# Patient Record
Sex: Male | Born: 1975 | Race: Black or African American | Hispanic: No | Marital: Single | Smoking: Current some day smoker
Health system: Southern US, Community
[De-identification: ages and names within clinical notes are randomized; demographics above are authoritative.]

## PROBLEM LIST (undated history)

## (undated) DIAGNOSIS — F209 Schizophrenia, unspecified: Secondary | ICD-10-CM

## (undated) DIAGNOSIS — F32A Depression, unspecified: Secondary | ICD-10-CM

## (undated) DIAGNOSIS — F419 Anxiety disorder, unspecified: Secondary | ICD-10-CM

## (undated) DIAGNOSIS — F319 Bipolar disorder, unspecified: Secondary | ICD-10-CM

## (undated) DIAGNOSIS — R768 Other specified abnormal immunological findings in serum: Secondary | ICD-10-CM

## (undated) DIAGNOSIS — G4730 Sleep apnea, unspecified: Secondary | ICD-10-CM

## (undated) DIAGNOSIS — J45909 Unspecified asthma, uncomplicated: Secondary | ICD-10-CM

## (undated) DIAGNOSIS — B1920 Unspecified viral hepatitis C without hepatic coma: Secondary | ICD-10-CM

## (undated) DIAGNOSIS — F329 Major depressive disorder, single episode, unspecified: Secondary | ICD-10-CM

## (undated) DIAGNOSIS — B182 Chronic viral hepatitis C: Secondary | ICD-10-CM

## (undated) HISTORY — DX: Major depressive disorder, single episode, unspecified: F32.9

## (undated) HISTORY — DX: Sleep apnea, unspecified: G47.30

## (undated) HISTORY — DX: Depression, unspecified: F32.A

## (undated) HISTORY — DX: Anxiety disorder, unspecified: F41.9

## (undated) HISTORY — DX: Other specified abnormal immunological findings in serum: R76.8

## (undated) HISTORY — DX: Chronic viral hepatitis C: B18.2

## (undated) HISTORY — PX: CYST REMOVAL NECK: SHX6281

## (undated) HISTORY — DX: Unspecified asthma, uncomplicated: J45.909

---

## 2003-02-20 ENCOUNTER — Emergency Department (HOSPITAL_COMMUNITY): Admission: EM | Admit: 2003-02-20 | Discharge: 2003-02-20 | Payer: Self-pay | Admitting: Emergency Medicine

## 2003-02-25 ENCOUNTER — Emergency Department (HOSPITAL_COMMUNITY): Admission: EM | Admit: 2003-02-25 | Discharge: 2003-02-25 | Payer: Self-pay | Admitting: Emergency Medicine

## 2004-10-30 ENCOUNTER — Emergency Department: Payer: Self-pay | Admitting: Internal Medicine

## 2008-01-14 ENCOUNTER — Emergency Department: Payer: Self-pay | Admitting: Internal Medicine

## 2008-01-16 ENCOUNTER — Emergency Department: Payer: Self-pay | Admitting: Emergency Medicine

## 2009-05-14 ENCOUNTER — Emergency Department: Payer: Self-pay | Admitting: Emergency Medicine

## 2010-03-21 ENCOUNTER — Emergency Department: Payer: Self-pay | Admitting: Emergency Medicine

## 2010-03-28 ENCOUNTER — Emergency Department: Payer: Self-pay | Admitting: Emergency Medicine

## 2010-06-03 ENCOUNTER — Emergency Department: Payer: Self-pay | Admitting: Emergency Medicine

## 2012-09-09 ENCOUNTER — Emergency Department: Payer: Self-pay | Admitting: Emergency Medicine

## 2012-09-09 LAB — DRUG SCREEN, URINE
Amphetamines, Ur Screen: NEGATIVE (ref ?–1000)
Benzodiazepine, Ur Scrn: NEGATIVE (ref ?–200)
Cannabinoid 50 Ng, Ur ~~LOC~~: POSITIVE (ref ?–50)
Cocaine Metabolite,Ur ~~LOC~~: NEGATIVE (ref ?–300)
MDMA (Ecstasy)Ur Screen: NEGATIVE (ref ?–500)
Phencyclidine (PCP) Ur S: NEGATIVE (ref ?–25)
Tricyclic, Ur Screen: NEGATIVE (ref ?–1000)

## 2012-09-09 LAB — URINALYSIS, COMPLETE
Bacteria: NONE SEEN
Bilirubin,UR: NEGATIVE
Blood: NEGATIVE
Glucose,UR: NEGATIVE mg/dL
Ketone: NEGATIVE
Leukocyte Esterase: NEGATIVE
Nitrite: NEGATIVE
Ph: 5
Protein: NEGATIVE
RBC,UR: <1 /HPF
Specific Gravity: 1.024
Squamous Epithelial: 1
WBC UR: 2 /HPF

## 2012-09-09 LAB — COMPREHENSIVE METABOLIC PANEL
Albumin: 3.9 g/dL (ref 3.4–5.0)
Alkaline Phosphatase: 45 U/L — ABNORMAL LOW (ref 50–136)
Anion Gap: 11 (ref 7–16)
BUN: 6 mg/dL — ABNORMAL LOW (ref 7–18)
Calcium, Total: 8.2 mg/dL — ABNORMAL LOW (ref 8.5–10.1)
Chloride: 108 mmol/L — ABNORMAL HIGH (ref 98–107)
Creatinine: 0.97 mg/dL (ref 0.60–1.30)
EGFR (African American): >60
Glucose: 109 mg/dL — ABNORMAL HIGH (ref 65–99)
Potassium: 3.6 mmol/L (ref 3.5–5.1)
SGOT(AST): 53 U/L — ABNORMAL HIGH (ref 15–37)
Total Protein: 7.8 g/dL (ref 6.4–8.2)

## 2012-09-09 LAB — TROPONIN I
Troponin-I: <0.02 ng/mL
Troponin-I: <0.02 ng/mL

## 2012-09-09 LAB — CBC
HCT: 39.8 % — ABNORMAL LOW (ref 40.0–52.0)
HGB: 14.1 g/dL (ref 13.0–18.0)
MCH: 31.4 pg (ref 26.0–34.0)
MCHC: 35.5 g/dL (ref 32.0–36.0)
WBC: 10.2 10*3/uL (ref 3.8–10.6)

## 2012-09-09 LAB — CK TOTAL AND CKMB (NOT AT ARMC)
CK, Total: 917 U/L — ABNORMAL HIGH (ref 35–232)
CK, Total: 755 U/L — ABNORMAL HIGH (ref 35–232)
CK-MB: 3.4 ng/mL (ref 0.5–3.6)
CK-MB: 2.7 ng/mL (ref 0.5–3.6)

## 2012-09-10 LAB — CK TOTAL AND CKMB (NOT AT ARMC)
CK, Total: 656 U/L — ABNORMAL HIGH (ref 35–232)
CK-MB: 2.5 ng/mL (ref 0.5–3.6)

## 2014-01-24 ENCOUNTER — Emergency Department: Payer: Self-pay | Admitting: Emergency Medicine

## 2014-01-24 LAB — CBC
HCT: 41.3 % (ref 40.0–52.0)
HGB: 14.6 g/dL (ref 13.0–18.0)
MCH: 32.1 pg (ref 26.0–34.0)
MCHC: 35.3 g/dL (ref 32.0–36.0)
MCV: 91 fL (ref 80–100)
Platelet: 165 10*3/uL (ref 150–440)
RBC: 4.54 10*6/uL (ref 4.40–5.90)
RDW: 15.1 % — ABNORMAL HIGH (ref 11.5–14.5)
WBC: 7.2 10*3/uL (ref 3.8–10.6)

## 2014-01-24 LAB — COMPREHENSIVE METABOLIC PANEL
ALBUMIN: 3.6 g/dL (ref 3.4–5.0)
ALT: 44 U/L (ref 12–78)
AST: 52 U/L — AB (ref 15–37)
Alkaline Phosphatase: 46 U/L
Anion Gap: 2 — ABNORMAL LOW (ref 7–16)
BUN: 8 mg/dL (ref 7–18)
Bilirubin,Total: 0.4 mg/dL (ref 0.2–1.0)
CALCIUM: 8 mg/dL — AB (ref 8.5–10.1)
Chloride: 109 mmol/L — ABNORMAL HIGH (ref 98–107)
Co2: 27 mmol/L (ref 21–32)
Creatinine: 1.05 mg/dL (ref 0.60–1.30)
EGFR (African American): >60
GLUCOSE: 99 mg/dL (ref 65–99)
OSMOLALITY: 274 (ref 275–301)
POTASSIUM: 3.8 mmol/L (ref 3.5–5.1)
Sodium: 138 mmol/L (ref 136–145)
Total Protein: 7.2 g/dL (ref 6.4–8.2)

## 2014-01-24 LAB — TROPONIN I: Troponin-I: <0.02 ng/mL

## 2015-03-02 NOTE — Consult Note (Signed)
Brief Consult Note: Diagnosis: Paranoid schizophrenia.   Patient was seen by consultant.   Consult note dictated.   Recommend further assessment or treatment.   Orders entered.   Discussed with Attending MD.   Comments: Mr. Delford FieldWright has a h/o schizophrenia. He complains of auditory hallucinations in the context of medication noncompliance as he stopped [place] Sust[place] injections 2, or more, months ago. He is not suicidal or homicidal. There are no command halucinations.   PLAN: 1. The patient no longer meets criteria for IVC. I will terminate proceedings. Please discharge as appriopriate.  2. Will give initial dose of Invega sustenna 324 mg im now.   3. [facility]:30 am. He has appopintemnt with Berline Choughhristine Odom.   4. His family will pick him up from ER.  Electronic Signatures: Kristine LineaPucilowska, Marvel Sapp (MD)  (Signed 29-Oct-13 10:25)  Authored: Brief Consult Note   Last Updated: 29-Oct-13 10:25 by Kristine LineaPucilowska, Zidan Helget (MD)

## 2015-05-30 ENCOUNTER — Emergency Department
Admission: EM | Admit: 2015-05-30 | Discharge: 2015-05-30 | Disposition: A | Discharge status: Home / Self Care | Payer: Medicaid Other | Attending: Emergency Medicine | Admitting: Emergency Medicine

## 2015-05-30 ENCOUNTER — Encounter: Payer: Self-pay | Admitting: Emergency Medicine

## 2015-05-30 ENCOUNTER — Other Ambulatory Visit: Payer: Self-pay

## 2015-05-30 ENCOUNTER — Emergency Department: Payer: Medicaid Other

## 2015-05-30 DIAGNOSIS — R079 Chest pain, unspecified: Secondary | ICD-10-CM

## 2015-05-30 DIAGNOSIS — Z720 Tobacco use: Secondary | ICD-10-CM | POA: Insufficient documentation

## 2015-05-30 HISTORY — DX: Schizophrenia, unspecified: F20.9

## 2015-05-30 HISTORY — DX: Bipolar disorder, unspecified: F31.9

## 2015-05-30 LAB — BASIC METABOLIC PANEL
ANION GAP: 7 (ref 5–15)
BUN: 10 mg/dL (ref 6–20)
CALCIUM: 8.5 mg/dL — AB (ref 8.9–10.3)
CO2: 23 mmol/L (ref 22–32)
CREATININE: 0.91 mg/dL (ref 0.61–1.24)
Chloride: 106 mmol/L (ref 101–111)
GFR calc Af Amer: >60 mL/min (ref >60)
GFR calc non Af Amer: >60 mL/min (ref >60)
GLUCOSE: 127 mg/dL — AB (ref 65–99)
Potassium: 3.3 mmol/L — ABNORMAL LOW (ref 3.5–5.1)
SODIUM: 136 mmol/L (ref 135–145)

## 2015-05-30 LAB — TROPONIN I
Troponin I: <0.03 ng/mL (ref <0.031)
Troponin I: <0.03 ng/mL (ref <0.031)

## 2015-05-30 LAB — CBC
HCT: 41.3 % (ref 40.0–52.0)
Hemoglobin: 14.6 g/dL (ref 13.0–18.0)
MCH: 31.2 pg (ref 26.0–34.0)
MCHC: 35.3 g/dL (ref 32.0–36.0)
MCV: 88.5 fL (ref 80.0–100.0)
Platelets: 168 10*3/uL (ref 150–440)
RBC: 4.67 MIL/uL (ref 4.40–5.90)
RDW: 14.5 % (ref 11.5–14.5)
WBC: 7.4 10*3/uL (ref 3.8–10.6)

## 2015-05-30 MED ORDER — GI COCKTAIL ~~LOC~~
ORAL | Status: AC
  Administered 2015-05-30: 30 mL via ORAL
  Filled 2015-05-30: qty 30

## 2015-05-30 MED ORDER — GI COCKTAIL ~~LOC~~
30.0000 mL | Freq: Once | ORAL | Status: AC
  Administered 2015-05-30: 30 mL via ORAL

## 2015-05-30 NOTE — ED Provider Notes (Signed)
Patient signed out to me by Dr. Manson PasseyBrown follow-up second troponin. Second troponin negative. Patient informed he feels well and is ready for discharge.  Jene Everyobert Omega Slager, MD 05/30/15 1630

## 2015-05-30 NOTE — Discharge Instructions (Signed)

## 2015-05-30 NOTE — ED Provider Notes (Signed)
[facility] Emergency Department Provider Note  ____________________________________________  Time seen: 1:15 PM  I have reviewed the triage vital signs and the nursing notes.   HISTORY  Chief Complaint Chest Pain      HPI Dominic [name] is a 39 y.o. male presents with central chest pain 2 days intermittently. Patient states pain lasting approximately few minutes each episode. Of note patient states he seems this same discomfort every time he gets is [place] for which she is prescribed for his schizophrenia. Patient stated last time he received his and vagal is one month ago   Past medical history Schizophrenia     Past Surgical History  Procedure Laterality Date  . Cyst removal hand      neck    Current Outpatient Rx  Name  Route  Sig  Dispense  Refill  . diphenhydrAMINE (SOMINEX) 25 MG tablet   Oral   Take 25 mg by mouth at bedtime as needed for sleep.         . Paliperidone Palmitate (INVEGA SUSTENNA IM)   Intramuscular   Inject 1 Dose into the muscle every 30 (thirty) days.           Allergies Review of patient's allergies indicates no known allergies.  History reviewed. No pertinent family history.  Social History History  Substance Use Topics  . Smoking status: Current Every Day Smoker -- 0.50 packs/day    Types: Cigarettes  . Smokeless tobacco: Never Used  . Alcohol Use: Yes     Comment: 3 x 25 oz every other day    Review of Systems  Constitutional: Negative for fever. Eyes: Negative for visual changes. ENT: Negative for sore throat. Cardiovascular: Negative for chest pain. Respiratory: Negative for shortness of breath. Gastrointestinal: Negative for abdominal pain, vomiting and diarrhea. Genitourinary: Negative for dysuria. Musculoskeletal: Negative for back pain. Skin: Negative for rash. Neurological: Negative for headaches, focal weakness or numbness.   10-point ROS otherwise  negative.  ____________________________________________   PHYSICAL EXAM:  VITAL SIGNS: ED Triage Vitals  Enc Vitals Group     BP 05/30/15 1200 132/86 mmHg     Pulse Rate 05/30/15 1200 64     Resp --      Temp 05/30/15 1200 97.9 F (36.6 C)     Temp Source 05/30/15 1200 Oral     SpO2 05/30/15 1200 94 %     Weight 05/30/15 1200 250 lb (113.399 kg)     Height 05/30/15 1200 5\' 11"  (1.803 m)     Head Cir --      Peak Flow --      Pain Score 05/30/15 1201 9     Pain Loc --      Pain Edu? --      Excl. in GC? --      Constitutional: Alert and oriented. Well appearing and in no distress. Eyes: Conjunctivae are normal. PERRL. Normal extraocular movements. ENT   Head: Normocephalic and atraumatic.   Nose: No congestion/rhinnorhea.   Mouth/Throat: Mucous membranes are moist.   Neck: No stridor. Cardiovascular: Normal rate, regular rhythm. Normal and symmetric distal pulses are present in all extremities. No murmurs, rubs, or gallops. Respiratory: Normal respiratory effort without tachypnea nor retractions. Breath sounds are clear and equal bilaterally. No wheezes/rales/rhonchi. Gastrointestinal: Soft and nontender. No distention. There is no CVA tenderness. Genitourinary: deferred Musculoskeletal: Nontender with normal range of motion in all extremities. No joint effusions.  No lower extremity tenderness nor edema. Neurologic:  Normal speech  and language. No gross focal neurologic deficits are appreciated. Speech is normal.  Skin:  Skin is warm, dry and intact. No rash noted. Psychiatric: Mood and affect are normal. Speech and behavior are normal. Patient exhibits appropriate insight and judgment.  ____________________________________________    LABS (pertinent positives/negatives)  Labs Reviewed  BASIC METABOLIC PANEL - Abnormal; Notable for the following:    Potassium 3.3 (*)    Glucose, Bld 127 (*)    Calcium 8.5 (*)    All other components within normal  limits  CBC  TROPONIN I  TROPONIN I     ____________________________________________   EKG  ED ECG REPORT I, BROWN, Butler N, the attending physician, personally viewed and interpreted this ECG.   Date: 05/30/2015  EKG Time: 12:01 PM  Rate: 65  Rhythm:  Normal sinus rhythm Axis: Normal  Intervals: Normal  ST&T Change: None   ____________________________________________    RADIOLOGY  Chest x-ray revealed:  IMPRESSION: No evidence of acute cardiopulmonary disease.   Electronically Signed By: Charline Bills M.D. On: 05/30/2015 12:45     INITIAL IMPRESSION / ASSESSMENT AND PLAN / ED COURSE  Pertinent labs & imaging results that were available during my care of the patient were reviewed by me and considered in my medical decision making (see chart for details).  Initial troponin negative chest x-ray unremarkable perc 0. Will repeat troponin if negative patient will be discharged home with cardiology follow-up. ____________________________________________   FINAL CLINICAL IMPRESSION(S) / ED DIAGNOSES  Final diagnoses:  Chest pain, unspecified chest pain type      Darci Current, MD 06/01/15 [phone]

## 2015-05-30 NOTE — ED Notes (Signed)
Pt reports that he has had chest pain intermittently x 2 days. He states that he takes (invega) (psych med) injection monthly and that he has the pain after the injection. He thinks they are related. The last injection was June 25. He reports that in the past the pain has only lasted a day or so, but it has persisted for 2 days this time. Pt is alert & oriented with NAD.

## 2016-02-16 ENCOUNTER — Encounter: Payer: Self-pay | Admitting: *Deleted

## 2016-02-16 ENCOUNTER — Emergency Department
Admission: EM | Admit: 2016-02-16 | Discharge: 2016-02-16 | Disposition: A | Discharge status: Other Acute Inpatient Hospital | Payer: Medicaid Other | Attending: Emergency Medicine | Admitting: Emergency Medicine

## 2016-02-16 DIAGNOSIS — F1210 Cannabis abuse, uncomplicated: Secondary | ICD-10-CM | POA: Diagnosis not present

## 2016-02-16 DIAGNOSIS — F19959 Other psychoactive substance use, unspecified with psychoactive substance-induced psychotic disorder, unspecified: Secondary | ICD-10-CM | POA: Diagnosis not present

## 2016-02-16 DIAGNOSIS — F209 Schizophrenia, unspecified: Secondary | ICD-10-CM | POA: Insufficient documentation

## 2016-02-16 DIAGNOSIS — F1410 Cocaine abuse, uncomplicated: Secondary | ICD-10-CM

## 2016-02-16 DIAGNOSIS — F17210 Nicotine dependence, cigarettes, uncomplicated: Secondary | ICD-10-CM | POA: Diagnosis not present

## 2016-02-16 DIAGNOSIS — F10920 Alcohol use, unspecified with intoxication, uncomplicated: Secondary | ICD-10-CM

## 2016-02-16 DIAGNOSIS — F29 Unspecified psychosis not due to a substance or known physiological condition: Secondary | ICD-10-CM

## 2016-02-16 DIAGNOSIS — F3181 Bipolar II disorder: Secondary | ICD-10-CM

## 2016-02-16 DIAGNOSIS — F1010 Alcohol abuse, uncomplicated: Secondary | ICD-10-CM

## 2016-02-16 DIAGNOSIS — F329 Major depressive disorder, single episode, unspecified: Secondary | ICD-10-CM | POA: Insufficient documentation

## 2016-02-16 DIAGNOSIS — F10129 Alcohol abuse with intoxication, unspecified: Secondary | ICD-10-CM | POA: Insufficient documentation

## 2016-02-16 DIAGNOSIS — F919 Conduct disorder, unspecified: Secondary | ICD-10-CM | POA: Diagnosis present

## 2016-02-16 LAB — COMPREHENSIVE METABOLIC PANEL
ALBUMIN: 3.9 g/dL (ref 3.5–5.0)
ALK PHOS: 39 U/L (ref 38–126)
ALT: 19 U/L (ref 17–63)
AST: 26 U/L (ref 15–41)
Anion gap: 6 (ref 5–15)
BUN: 6 mg/dL (ref 6–20)
CO2: 21 mmol/L — AB (ref 22–32)
CREATININE: 0.79 mg/dL (ref 0.61–1.24)
Calcium: 8.5 mg/dL — ABNORMAL LOW (ref 8.9–10.3)
Chloride: 111 mmol/L (ref 101–111)
GFR calc Af Amer: >60 mL/min (ref >60)
GFR calc non Af Amer: >60 mL/min (ref >60)
GLUCOSE: 107 mg/dL — AB (ref 65–99)
Potassium: 3.5 mmol/L (ref 3.5–5.1)
Sodium: 138 mmol/L (ref 135–145)
Total Bilirubin: 0.3 mg/dL (ref 0.3–1.2)
Total Protein: 7.7 g/dL (ref 6.5–8.1)

## 2016-02-16 LAB — CBC
HCT: 40.5 % (ref 40.0–52.0)
Hemoglobin: 14.5 g/dL (ref 13.0–18.0)
MCH: 30.7 pg (ref 26.0–34.0)
MCHC: 35.7 g/dL (ref 32.0–36.0)
MCV: 86.1 fL (ref 80.0–100.0)
Platelets: 154 10*3/uL (ref 150–440)
RBC: 4.7 MIL/uL (ref 4.40–5.90)
RDW: 15.4 % — ABNORMAL HIGH (ref 11.5–14.5)
WBC: 8.2 10*3/uL (ref 3.8–10.6)

## 2016-02-16 LAB — ETHANOL: Alcohol, Ethyl (B): 143 mg/dL — ABNORMAL HIGH (ref <5)

## 2016-02-16 LAB — SALICYLATE LEVEL: Salicylate Lvl: <4 mg/dL (ref 2.8–30.0)

## 2016-02-16 LAB — URINE DRUG SCREEN, QUALITATIVE (ARMC ONLY)
Amphetamines, Ur Screen: NOT DETECTED
BARBITURATES, UR SCREEN: NOT DETECTED
Benzodiazepine, Ur Scrn: NOT DETECTED
CANNABINOID 50 NG, UR ~~LOC~~: POSITIVE — AB
COCAINE METABOLITE, UR ~~LOC~~: POSITIVE — AB
MDMA (Ecstasy)Ur Screen: NOT DETECTED
Methadone Scn, Ur: NOT DETECTED
OPIATE, UR SCREEN: NOT DETECTED
PHENCYCLIDINE (PCP) UR S: NOT DETECTED
Tricyclic, Ur Screen: NOT DETECTED

## 2016-02-16 LAB — ACETAMINOPHEN LEVEL

## 2016-02-16 MED ORDER — LORAZEPAM 2 MG PO TABS
ORAL_TABLET | ORAL | Status: AC
  Administered 2016-02-16: 2 mg via ORAL
  Filled 2016-02-16: qty 1

## 2016-02-16 MED ORDER — LORAZEPAM 2 MG PO TABS
2.0000 mg | ORAL_TABLET | Freq: Once | ORAL | Status: AC
  Administered 2016-02-16: 2 mg via ORAL

## 2016-02-16 MED ORDER — ZIPRASIDONE MESYLATE 20 MG IM SOLR: 10.0000 mg | Freq: Once | INTRAMUSCULAR | Status: DC

## 2016-02-16 MED ORDER — ZIPRASIDONE MESYLATE 20 MG IM SOLR
INTRAMUSCULAR | Status: AC
  Filled 2016-02-16: qty 20

## 2016-02-16 NOTE — ED Notes (Signed)
Pt speaking on phone to fiance. Pt is calm. No concerns voiced. No distress noted. Maintained on 15 minute checks and observation by security camera for safety.

## 2016-02-16 NOTE — ED Notes (Signed)
Pt denies pain, SI/HI and AVH.

## 2016-02-16 NOTE — ED Notes (Signed)
Pt brought in by [place] police.  Pt is IVC.  Pt has been drinking alcohol.  Pt denies SI or HI.  Pt has using cocaine today.

## 2016-02-16 NOTE — ED Notes (Signed)
Pt taking a shower. No concerns voiced. No distress noted. Maintained on 15 minute checks and observation by security camera for safety.  Pt to be  discharged.

## 2016-02-16 NOTE — Discharge Instructions (Signed)
Polysubstance Abuse °When people abuse more than one drug or type of drug it is called polysubstance or polydrug abuse. For example, many smokers also drink alcohol. This is one form of polydrug abuse. Polydrug abuse also refers to the use of a drug to counteract an unpleasant effect produced by another drug. It may also be used to help with withdrawal from another drug. People who take stimulants may become agitated. Sometimes this agitation is countered with a tranquilizer. This helps protect against the unpleasant side effects. Polydrug abuse also refers to the use of different drugs at the same time.  °Anytime drug use is interfering with normal living activities, it has become abuse. This includes problems with family and friends. Psychological dependence has developed when your mind tells you that the drug is needed. This is usually followed by physical dependence which has developed when continuing increases of drug are required to get the same feeling or "high". This is known as addiction or chemical dependency. A person's risk is much higher if there is a history of chemical dependency in the family. °SIGNS OF CHEMICAL DEPENDENCY °· You have been told by friends or family that drugs have become a problem. °· You fight when using drugs. °· You are having blackouts (not remembering what you do while using). °· You feel sick from using drugs but continue using. °· You lie about use or amounts of drugs (chemicals) used. °· You need chemicals to get you going. °· You are suffering in work performance or in school because of drug use. °· You get sick from use of drugs but continue to use anyway. °· You need drugs to relate to people or feel comfortable in social situations. °· You use drugs to forget problems. °"Yes" answered to any of the above signs of chemical dependency indicates there are problems. The longer the use of drugs continues, the greater the problems will become. °If there is a family history of  drug or alcohol use, it is best not to experiment with these drugs. Continual use leads to tolerance. After tolerance develops more of the drug is needed to get the same feeling. This is followed by addiction. With addiction, drugs become the most important part of life. It becomes more important to take drugs than participate in the other usual activities of life. This includes relating to friends and family. Addiction is followed by dependency. Dependency is a condition where drugs are now needed not just to get high, but to feel normal. °Addiction cannot be cured but it can be stopped. This often requires outside help and the care of professionals. Treatment centers are listed in the yellow pages under: Cocaine, Narcotics, and Alcoholics Anonymous. Most hospitals and clinics can refer you to a specialized care center. Talk to your caregiver if you need help. °  °This information is not intended to replace advice given to you by your health care provider. Make sure you discuss any questions you have with your health care provider. °  °Document Released: 06/21/2005 Document Revised: 01/22/2012 Document Reviewed: 11/04/2014 °Elsevier Interactive Patient Education ©2016 Elsevier Inc. ° °

## 2016-02-16 NOTE — Consult Note (Signed)
[facility] Face-to-Face Psychiatry Consult   Reason for Consult:  Consult for this 40 year old man who is petition last night by his girlfriend with allegations that he was acting bizarrely and hostile. 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 Schizophrenia ([place]) [F20.9] 02/16/2016  . 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 Alcohol abuse [F10.10] 02/16/2016  . Cocaine abuse [F14.10] 02/16/2016    Total Time spent with patient: 1 hour  Subjective:   Dominic [name] is a 40 y.o. male patient admitted with "I was just at home and the cops came".  HPI:  Patient interviewed. Chart reviewed. Labs and vitals reviewed. 40 year old man brought in under IVC last night filed by his girlfriend with allegations that he had been agitated and acting bizarrely. Stated that he had been verbally hostile and had jumped out of a car at one point. The history given last night was that she thought that he had been drinking and abusing drugs and was out of control. The patient tells me that he thinks that his girlfriend was basically overreacting. He admits that he had some alcohol to drink yesterday eventually admitting that he probably had a full bottle of wine and that he was also using cocaine. He does admit that when he uses drugs and drinks he gets a little more agitated and rapid in his speech and changes some of his behavior but he denies that he had any thoughts of hurting himself or hurting anyone else. He denies that he was having any hallucinations. Patient says that overall his mood recently has been fine. Denies any sleep or health problems. Says that he rarely drinks maybe only a couple times a month and doesn't use cocaine nearly that often. He says that he has been compliant with his outpatient psychiatric treatment which is primarily getting a long-acting  injection of in [place] at [facility].  Social history: Lives by himself. 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 Has a girlfriend. Has some children as well. He makes some statements that suggest that he is a little bit paranoid but nothing clearly dramatic.  Medical history: Denies any significant medical problems including denying high blood pressure diabetes or heart disease.  Substance abuse history: He says that he only drinks a couple times a month and minimizes the degree to which substance abuse has been a problem. It looks like he's had at least one prior visit here to the hospital when substance abuse probably exacerbated his illness. Doesn't seem to of engaged in outpatient substance abuse treatment.  Past Psychiatric History: Has a history of schizophrenia. He says that when he is not getting his shot or misses it for a month he starts to hallucinate but that recently he has been compliant. He says he is next due for a shot on April 12. Denies that he's ever had inpatient psychiatric treatment which appears to be correct from what I can see. No history of suicide attempts or violence.  Risk to Self: Suicidal Ideation: No Suicidal Intent: No Is patient at risk for suicide?: No Suicidal Plan?: No Access to Means: No What has been your use of drugs/alcohol within the last 12 months?: Varied use of alcohol, crack cocaine, and marijuana How many times?: 1 Other Self Harm Risks: Long SA hx Triggers for Past Attempts: Unknown Intentional Self Injurious Behavior: None Risk to Others: Homicidal Ideation: No Thoughts of Harm to  Others: No Current Homicidal Intent: No Current Homicidal Plan: No Access to Homicidal Means: No Identified Victim: n/a History of harm to others?: No Assessment of Violence: In distant past (Endorses getting into fights when he was younger) Violent Behavior Description: No known recent violent behavior Does patient have access to weapons?: No Criminal Charges Pending?: No Does  patient have a court date: No Prior Inpatient Therapy: Prior Inpatient Therapy: Yes Prior Therapy Dates: Multiple Prior Therapy Facilty/Provider(s): [facility] Reason for Treatment: Schizophrenia Prior Outpatient Therapy: Prior Outpatient Therapy: Yes Prior Therapy Dates: Current Prior Therapy Facilty/Provider(s): [facility] Reason for Treatment: Schizophrenia; Med management Does patient have an ACCT team?: No Does patient have Intensive In-House Services?  : No Does patient have [facility] services? : No Does patient have P4CC services?: Unknown  Past Medical History:  Past Medical History  Diagnosis Date  . Bipolar 1 disorder ([place])   . Schizophrenia [facility])     Past Surgical History  Procedure Laterality Date  . Cyst removal hand      neck   Family History: No family history on file. Family Psychiatric  History: Patient says he is not aware of any family history of mental illness Social History:  History  Alcohol Use  . Yes    Comment: 3 x 25 oz every other day     History  Drug Use Not on file    Social History   Social History  . Marital Status: Single    Spouse Name: N/A  . Number of Children: N/A  . Years of Education: N/A   Social History Main Topics  . Smoking status: Current Every Day Smoker -- 0.50 packs/day    Types: Cigarettes  . Smokeless tobacco: Never Used  . Alcohol Use: Yes     Comment: 3 x 25 oz every other day  . Drug Use: None  . Sexual Activity: Not Asked   Other Topics Concern  . None   Social History Narrative   Additional Social History:    Allergies:  No Known Allergies  Labs:  Results for orders placed or performed during the hospital encounter of 02/16/16 (from the past 48 hour(s))  Comprehensive metabolic panel     Status: Abnormal   Collection Time: 02/16/16  1:16 AM  Result Value Ref Range   Sodium 138 135 - 145 mmol/L   Potassium 3.5 3.5 - 5.1 mmol/L   Chloride 111 101 - 111 mmol/L   CO2 21 (L) 22 - 32 mmol/L    Glucose, Bld 107 (H) 65 - 99 mg/dL   BUN 6 6 - 20 mg/dL   Creatinine, Ser 0.79 0.61 - 1.24 mg/dL   Calcium 8.5 (L) 8.9 - 10.3 mg/dL   Total Protein 7.7 6.5 - 8.1 g/dL   Albumin 3.9 3.5 - 5.0 g/dL   AST 26 15 - 41 U/L   ALT 19 17 - 63 U/L   Alkaline Phosphatase 39 38 - 126 U/L   Total Bilirubin 0.3 0.3 - 1.2 mg/dL   GFR calc non Af Amer >60 >60 mL/min   GFR calc Af Amer >60 >60 mL/min    Comment: (NOTE) The eGFR has been calculated using the CKD EPI equation. This calculation has not been validated in all clinical situations. eGFR's persistently <60 mL/min signify possible Chronic Kidney Disease.    Anion gap 6 5 - 15  Ethanol (ETOH)     Status: Abnormal   Collection Time: 02/16/16  1:16 AM  Result Value Ref Range  Alcohol, Ethyl (B) 143 (H) <5 mg/dL    Comment:        LOWEST DETECTABLE LIMIT FOR SERUM ALCOHOL IS 5 mg/dL FOR MEDICAL PURPOSES ONLY   Urine Drug Screen, Qualitative ([facility] only)     Status: Abnormal   Collection Time: 02/16/16  1:16 AM  Result Value Ref Range   Tricyclic, Ur Screen NONE DETECTED NONE DETECTED   Amphetamines, Ur Screen NONE DETECTED NONE DETECTED   MDMA (Ecstasy)Ur Screen NONE DETECTED NONE DETECTED   Cocaine Metabolite,Ur [place] POSITIVE (A) NONE DETECTED   Opiate, Ur Screen NONE DETECTED NONE DETECTED   Phencyclidine (PCP) Ur S NONE DETECTED NONE DETECTED   Cannabinoid 50 Ng, Ur [place] POSITIVE (A) NONE DETECTED   Barbiturates, Ur Screen NONE DETECTED NONE DETECTED   Benzodiazepine, Ur Scrn NONE DETECTED NONE DETECTED   Methadone Scn, Ur NONE DETECTED NONE DETECTED    Comment: (NOTE) 628  Tricyclics, urine               Cutoff 1000 ng/mL 200  Amphetamines, urine             Cutoff 1000 ng/mL 300  MDMA (Ecstasy), urine           Cutoff 500 ng/mL 400  Cocaine Metabolite, urine       Cutoff 300 ng/mL 500  Opiate, urine                   Cutoff 300 ng/mL 600  Phencyclidine (PCP), urine      Cutoff 25 ng/mL 700  Cannabinoid, urine              Cutoff  50 ng/mL 800  Barbiturates, urine             Cutoff 200 ng/mL 900  Benzodiazepine, urine           Cutoff 200 ng/mL 1000 Methadone, urine                Cutoff 300 ng/mL 1100 1200 The urine drug screen provides only a preliminary, unconfirmed 1300 analytical test result and should not be used for non-medical 1400 purposes. Clinical consideration and professional judgment should 1500 be applied to any positive drug screen result due to possible 1600 interfering substances. A more specific alternate chemical method 1700 must be used in order to obtain a confirmed analytical result.  1800 Gas chromato graphy / mass spectrometry (GC/MS) is the preferred 1900 confirmatory method.   CBC     Status: Abnormal   Collection Time: 02/16/16  1:16 AM  Result Value Ref Range   WBC 8.2 3.8 - 10.6 K/uL   RBC 4.70 4.40 - 5.90 MIL/uL   Hemoglobin 14.5 13.0 - 18.0 g/dL   HCT 40.5 40.0 - 52.0 %   MCV 86.1 80.0 - 100.0 fL   MCH 30.7 26.0 - 34.0 pg   MCHC 35.7 32.0 - 36.0 g/dL   RDW 15.4 (H) 11.5 - 14.5 %   Platelets 154 150 - 440 K/uL  Acetaminophen level     Status: Abnormal   Collection Time: 02/16/16  1:16 AM  Result Value Ref Range   Acetaminophen (Tylenol), Serum <10 (L) 10 - 30 ug/mL    Comment:        THERAPEUTIC CONCENTRATIONS VARY SIGNIFICANTLY. A RANGE OF 10-30 ug/mL MAY BE AN EFFECTIVE CONCENTRATION FOR MANY PATIENTS. HOWEVER, SOME ARE BEST TREATED AT CONCENTRATIONS OUTSIDE THIS RANGE. ACETAMINOPHEN CONCENTRATIONS >150 ug/mL AT 4 HOURS AFTER INGESTION AND >50 ug/mL  AT 12 HOURS AFTER INGESTION ARE OFTEN ASSOCIATED WITH TOXIC REACTIONS.   Salicylate level     Status: None   Collection Time: 02/16/16  1:16 AM  Result Value Ref Range   Salicylate Lvl <7.4 2.8 - 30.0 mg/dL    No current facility-administered medications for this encounter.   Current Outpatient Prescriptions  Medication Sig Dispense Refill  . diphenhydrAMINE (SOMINEX) 25 MG tablet Take 25 mg by mouth at  bedtime as needed for sleep.    . Paliperidone Palmitate (INVEGA SUSTENNA IM) Inject 1 Dose into the muscle every 30 (thirty) days.      Musculoskeletal: Strength & Muscle Tone: within normal limits Gait & Station: normal Patient leans: N/A  Psychiatric Specialty Exam: Review of Systems  Constitutional: Negative.   HENT: Negative.   Eyes: Negative.   Respiratory: Negative.   Cardiovascular: Negative.   Gastrointestinal: Negative.   Musculoskeletal: Negative.   Skin: Negative.   Neurological: Negative.   Psychiatric/Behavioral: Negative for depression, suicidal ideas, hallucinations, memory loss and substance abuse. The patient is not nervous/anxious and does not have insomnia.     Blood pressure 140/79, pulse 73, temperature 98.4 F (36.9 C), temperature source Oral, resp. rate 16, height 6' (1.829 m), weight 111.131 kg (245 lb), SpO2 100 %.Body mass index is 33.22 kg/(m^2).  General Appearance: Disheveled  Eye Sport and exercise psychologist::  Fair  Speech:  Clear and Coherent  Volume:  Decreased  Mood:  Anxious  Affect:  Constricted  Thought Process:  Intact  Orientation:  Full (Time, Place, and Person)  Thought Content:  Negative  Suicidal Thoughts:  No  Homicidal Thoughts:  No  Memory:  Immediate;   Good Recent;   Fair Remote;   Fair  Judgement:  Fair  Insight:  Fair  Psychomotor Activity:  Normal  Concentration:  Fair  Recall:  [employer] of Knowledge:Fair  Language: Fair  Akathisia:  No  Handed:  Right  AIMS (if indicated):     Assets:  Communication Skills Desire for Improvement Financial Resources/Insurance Housing Physical Health Resilience  ADL's:  Intact  Cognition: WNL  Sleep:      Treatment Plan Summary: Daily contact with patient to assess and evaluate symptoms and progress in treatment, Medication management and Plan 40 year old man who has a history of schizophrenia which appears to be reasonably well controlled. He says he is compliant with his injection. Right  now he does not appear to be responding in to internal stimuli or acting strangely. He denies any suicidal or homicidal thoughts. Most likely he was acutely worsened yesterday by drinking and taking drugs both of which are confirmed by his drug screen. Patient was counseled that drug and alcohol abuse and chronic mental illness or a bad combination and that he should in the future avoid drug abuse and alcohol abuse as it is likely to cause further behavior problems. He acknowledges this and appears willing to at least consider it. No need for hospital level treatment. He can be released from the ER and will follow-up at [facility].  Disposition: Patient does not meet criteria for psychiatric inpatient admission. Supportive therapy provided about ongoing stressors.  Alethia Berthold, MD 02/16/2016 3:00 PM
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
## 2016-02-16 NOTE — ED Notes (Signed)
BEHAVIORAL HEALTH ROUNDING Patient sleeping: Yes.   Patient alert and oriented: not applicable SLEEPING Behavior appropriate: Yes.  ; If no, describe: SLEEPING Nutrition and fluids offered: No SLEEPING Toileting and hygiene offered: NoSLEEPING Sitter present: not applicable, Q 15 min safety rounds and observation via security camera. Law enforcement present: Yes ODS 

## 2016-02-16 NOTE — ED Notes (Signed)
Pt becoming agitated, area secured, DR Dolores FrameSung ordered Geodon, pt agreed to dress out and take PO meds

## 2016-02-16 NOTE — ED Notes (Signed)
Tobi Bastosnna, TTS called, information regarding pt history and family dynamics shared, assessment ATT

## 2016-02-16 NOTE — ED Provider Notes (Signed)
-----------------------------------------   3:32 PM on 02/16/2016 -----------------------------------------  Dr. Toni Amendlapacs has seen and evaluated Mr. Dominic [name]. He'll that his behavior was due to cocaine and alcohol and now that patient is sober he is back to his baseline and is acting appropriately. He follows up with [name] and can follow-up with them tomorrow. He feels he is safe for her hospital discharge and outpatient management.  Filed Vitals:   02/16/16 0652 02/16/16 1457  BP: 105/78 140/79  Pulse: 71 73  Temp: 98 F (36.7 C) 98.4 F (36.9 C)  Resp: 16 16     Maurilio LovelyNoelle Ezella Kell, MD 02/16/16 1533

## 2016-02-16 NOTE — BHH Counselor (Signed)
Psych disposition: Pt will need an AM psych evaluation in order to uphold or rescind IVC.

## 2016-02-16 NOTE — ED Provider Notes (Signed)
[facility] Emergency Department Provider Note  ____________________________________________  Time seen: Approximately 2:14 AM  I have reviewed the triage vital signs and the nursing notes.   HISTORY  Chief Complaint Behavior Problem    HPI [name] is a 40 y.o. male brought to the ED under IVC by his girlfriend who reports that patient has a history of bipolar disorder and schizophrenia who is on monthly injections. Girlfriend reports patient has not received his injection last month and has been having labile mood swings. He is also been drinking heavily tonight.Patient denies the above, states his girlfriend has not seen him in quite some time. States he is compliant with his monthly Invega injections as not due for another until April 12. Denies active SI/HI/AH/VH. He is upset at being brought in under IVC because he has to be at work at 7 AM. Patient denies medical complaints.   Past Medical History  Diagnosis Date  . Bipolar 1 disorder (HCC)   . Schizophrenia (HCC)     There are no active problems to display for this patient.   Past Surgical History  Procedure Laterality Date  . Cyst removal hand      neck    Current Outpatient Rx  Name  Route  Sig  Dispense  Refill  . diphenhydrAMINE (SOMINEX) 25 MG tablet   Oral   Take 25 mg by mouth at bedtime as needed for sleep.         . Paliperidone Palmitate (INVEGA SUSTENNA IM)   Intramuscular   Inject 1 Dose into the muscle every 30 (thirty) days.           Allergies Review of patient's allergies indicates no known allergies.  No family history on file.  Social History Social History  Substance Use Topics  . Smoking status: Current Every Day Smoker -- 0.50 packs/day    Types: Cigarettes  . Smokeless tobacco: Never Used  . Alcohol Use: Yes     Comment: 3 x 25 oz every other day    Review of Systems  Constitutional: No fever/chills. Eyes: No visual changes. ENT: No sore  throat. Cardiovascular: Denies chest pain. Respiratory: Denies shortness of breath. Gastrointestinal: No abdominal pain.  No nausea, no vomiting.  No diarrhea.  No constipation. Genitourinary: Negative for dysuria. Musculoskeletal: Negative for back pain. Skin: Negative for rash. Neurological: Negative for headaches, focal weakness or numbness. Psychiatric:Positive for labile mood swings.  10-point ROS otherwise negative.  ____________________________________________   PHYSICAL EXAM:  VITAL SIGNS: ED Triage Vitals  Enc Vitals Group     BP 02/16/16 0111 141/107 mmHg     Pulse Rate 02/16/16 0111 99     Resp 02/16/16 0111 20     Temp 02/16/16 0111 98.2 F (36.8 C)     Temp Source 02/16/16 0111 Oral     SpO2 02/16/16 0111 96 %     Weight 02/16/16 0111 245 lb (111.131 kg)     Height 02/16/16 0111 6' (1.829 m)     Head Cir --      Peak Flow --      Pain Score --      Pain Loc --      Pain Edu? --      Excl. in GC? --     Constitutional: Alert and oriented. Well appearing and in mild acute distress. Eyes: Conjunctivae are bloodshot bilaterally. PERRL. EOMI. Head: Atraumatic. Nose: No congestion/rhinnorhea. Mouth/Throat: Mucous membranes are moist.  Oropharynx non-erythematous. Neck: No stridor.  Cardiovascular: Normal rate, regular rhythm. Grossly normal heart sounds.  Good peripheral circulation. Respiratory: Normal respiratory effort.  No retractions. Lungs CTAB. Gastrointestinal: Soft and nontender. No distention. No abdominal bruits. No CVA tenderness. Musculoskeletal: No lower extremity tenderness nor edema.  No joint effusions. Neurologic:  Normal speech and language. No gross focal neurologic deficits are appreciated. No gait instability. Skin:  Skin is warm, dry and intact. No rash noted. Psychiatric: Mood and affect are agitated. Speech and behavior are normal.  ____________________________________________   LABS (all labs ordered are listed, but only  abnormal results are displayed)  Labs Reviewed  COMPREHENSIVE METABOLIC PANEL - Abnormal; Notable for the following:    CO2 21 (*)    Glucose, Bld 107 (*)    Calcium 8.5 (*)    All other components within normal limits  ETHANOL - Abnormal; Notable for the following:    Alcohol, Ethyl (B) 143 (*)    All other components within normal limits  URINE DRUG SCREEN, QUALITATIVE ([facility] ONLY) - Abnormal; Notable for the following:    Cocaine Metabolite,Ur [place] POSITIVE (*)    Cannabinoid 50 Ng, Ur [place] POSITIVE (*)    All other components within normal limits  CBC - Abnormal; Notable for the following:    RDW 15.4 (*)    All other components within normal limits  ACETAMINOPHEN LEVEL - Abnormal; Notable for the following:    Acetaminophen (Tylenol), Serum <10 (*)    All other components within normal limits  SALICYLATE LEVEL   ____________________________________________  EKG  None ____________________________________________  RADIOLOGY  None ____________________________________________   PROCEDURES  Procedure(s) performed: None  Critical Care performed: No  ____________________________________________   INITIAL IMPRESSION / ASSESSMENT AND PLAN / ED COURSE  Pertinent labs & imaging results that were available during my care of the patient were reviewed by me and considered in my medical decision making (see chart for details).  40 year old male with a history of bipolar disorder and schizophrenia who is brought under IVC for labile mood swings and aggression. Will maintain IVC pending TTS and psychiatry consults.  ----------------------------------------- 3:19 AM on 02/16/2016 -----------------------------------------  Laboratory and urine results notable for elevated alcohol, cocaine and cannabinoids. At this time patient is medically cleared for psychiatry disposition. [facility]. ____________________________________________   FINAL CLINICAL IMPRESSION(S) / ED DIAGNOSES  Final diagnoses:  Cocaine abuse  Marijuana abuse  Bipolar 2 disorder (HCC)  Schizophrenia, unspecified type (HCC)  Alcohol intoxication, uncomplicated (HCC)      Irean HongJade J Sung, MD 02/16/16 [phone]

## 2016-02-16 NOTE — ED Notes (Signed)
ENVIRONMENTAL ASSESSMENT  Potentially harmful objects out of patient reach: Yes.  Personal belongings secured: Yes.  Patient dressed in hospital provided attire only: Yes.  Plastic bags out of patient reach: Yes.  Patient care equipment (cords, cables, call bells, lines, and drains) shortened, removed, or accounted for: Yes.  Equipment and supplies removed from bottom of stretcher: Yes.  Potentially toxic materials out of patient reach: Yes.  Sharps container removed or out of patient reach: Yes.   Pt brought into ED BHU via sally port and wand with metal detector for safety by ODS officer. Patient oriented to unit/care area: Pt informed of unit policies and procedures.  Informed that, for their safety, care areas are designed for safety and monitored by security cameras at all times; and visiting hours explained to patient. Patient verbalizes understanding, and verbal contract for safety obtained.Pt shown to their room.   BEHAVIORAL HEALTH ROUNDING  Patient sleeping: No.  Patient alert and oriented: yes  Behavior appropriate: Yes. ; If no, describe:  Nutrition and fluids offered: Yes  Toileting and hygiene offered: Yes  Sitter present: not applicable, Q 15 min safety rounds and observation via security camera. Law enforcement present: Yes ODS

## 2016-02-16 NOTE — ED Notes (Addendum)
Pt getting dressed for discharged. All belongings will be returned to pt upon discharge.  Maintained on 15 minute checks and observation by security camera for safety.

## 2016-02-16 NOTE — ED Notes (Signed)
Pt dressed out and allowed to use phone to call mother as per his repeated requests and pleas to staff and Dr Dolores FrameSung, pt instead called significant other, pt yelling on phone, pt asked why didn't call mother, pt states"I'm not gonna call her she works 12s" pt completely unoriented to calling mother.

## 2017-08-22 ENCOUNTER — Encounter: Payer: Self-pay | Admitting: Emergency Medicine

## 2017-08-22 ENCOUNTER — Emergency Department
Admission: EM | Admit: 2017-08-22 | Discharge: 2017-08-22 | Disposition: A | Discharge status: Home / Self Care | Payer: Medicaid Other | Attending: Emergency Medicine | Admitting: Emergency Medicine

## 2017-08-22 DIAGNOSIS — R45850 Homicidal ideations: Secondary | ICD-10-CM | POA: Insufficient documentation

## 2017-08-22 DIAGNOSIS — F1010 Alcohol abuse, uncomplicated: Secondary | ICD-10-CM | POA: Diagnosis present

## 2017-08-22 DIAGNOSIS — F17210 Nicotine dependence, cigarettes, uncomplicated: Secondary | ICD-10-CM | POA: Diagnosis not present

## 2017-08-22 DIAGNOSIS — R443 Hallucinations, unspecified: Secondary | ICD-10-CM | POA: Diagnosis present

## 2017-08-22 DIAGNOSIS — Z79899 Other long term (current) drug therapy: Secondary | ICD-10-CM | POA: Diagnosis not present

## 2017-08-22 DIAGNOSIS — F1910 Other psychoactive substance abuse, uncomplicated: Secondary | ICD-10-CM | POA: Diagnosis not present

## 2017-08-22 DIAGNOSIS — F1410 Cocaine abuse, uncomplicated: Secondary | ICD-10-CM | POA: Diagnosis present

## 2017-08-22 DIAGNOSIS — R440 Auditory hallucinations: Secondary | ICD-10-CM | POA: Diagnosis not present

## 2017-08-22 DIAGNOSIS — F200 Paranoid schizophrenia: Secondary | ICD-10-CM

## 2017-08-22 DIAGNOSIS — F209 Schizophrenia, unspecified: Secondary | ICD-10-CM

## 2017-08-22 DIAGNOSIS — F29 Unspecified psychosis not due to a substance or known physiological condition: Secondary | ICD-10-CM | POA: Diagnosis present

## 2017-08-22 LAB — CBC
HCT: 42.1 % (ref 40.0–52.0)
HEMOGLOBIN: 14.9 g/dL (ref 13.0–18.0)
MCH: 30.7 pg (ref 26.0–34.0)
MCHC: 35.3 g/dL (ref 32.0–36.0)
MCV: 87 fL (ref 80.0–100.0)
Platelets: 195 10*3/uL (ref 150–440)
RBC: 4.84 MIL/uL (ref 4.40–5.90)
RDW: 14.3 % (ref 11.5–14.5)
WBC: 11.6 10*3/uL — ABNORMAL HIGH (ref 3.8–10.6)

## 2017-08-22 LAB — COMPREHENSIVE METABOLIC PANEL
ALT: 44 U/L (ref 17–63)
ANION GAP: 11 (ref 5–15)
AST: 41 U/L (ref 15–41)
Albumin: 4.3 g/dL (ref 3.5–5.0)
Alkaline Phosphatase: 32 U/L — ABNORMAL LOW (ref 38–126)
BUN: 12 mg/dL (ref 6–20)
CHLORIDE: 106 mmol/L (ref 101–111)
CO2: 23 mmol/L (ref 22–32)
Calcium: 9.1 mg/dL (ref 8.9–10.3)
Creatinine, Ser: 0.96 mg/dL (ref 0.61–1.24)
GFR calc non Af Amer: >60 mL/min (ref >60)
Glucose, Bld: 95 mg/dL (ref 65–99)
POTASSIUM: 3.2 mmol/L — AB (ref 3.5–5.1)
SODIUM: 140 mmol/L (ref 135–145)
Total Bilirubin: 0.9 mg/dL (ref 0.3–1.2)
Total Protein: 8.5 g/dL — ABNORMAL HIGH (ref 6.5–8.1)

## 2017-08-22 LAB — URINE DRUG SCREEN, QUALITATIVE (ARMC ONLY)
AMPHETAMINES, UR SCREEN: NOT DETECTED
BARBITURATES, UR SCREEN: NOT DETECTED
Benzodiazepine, Ur Scrn: NOT DETECTED
COCAINE METABOLITE, UR ~~LOC~~: POSITIVE — AB
Cannabinoid 50 Ng, Ur ~~LOC~~: POSITIVE — AB
MDMA (ECSTASY) UR SCREEN: NOT DETECTED
METHADONE SCREEN, URINE: NOT DETECTED
OPIATE, UR SCREEN: NOT DETECTED
Phencyclidine (PCP) Ur S: NOT DETECTED
TRICYCLIC, UR SCREEN: NOT DETECTED

## 2017-08-22 LAB — ETHANOL: Alcohol, Ethyl (B): <10 mg/dL (ref <10)

## 2017-08-22 LAB — SALICYLATE LEVEL

## 2017-08-22 LAB — ACETAMINOPHEN LEVEL

## 2017-08-22 MED ORDER — PALIPERIDONE PALMITATE 234 MG/1.5ML IM SUSP
234.0000 mg | Freq: Once | INTRAMUSCULAR | Status: AC
Start: 2017-08-22 — End: 2017-08-22
  Administered 2017-08-22: 234 mg via INTRAMUSCULAR
  Filled 2017-08-22: qty 1.5

## 2017-08-22 NOTE — ED Notes (Signed)
Patient alert and oriented. States he came to hospital because he had been using cocaine and started hearing voices and having thoughts of harming others. Patient also states he is on [place] monthly and missed his last month injection for September; therefore he has been without his medication. Patient denies SI/HI and A/V/H today. Patient provided support and encouragement. Q 15 minute checks in progress and patient remains safe on unit. Monitoring of patient continues.

## 2017-08-22 NOTE — ED Notes (Signed)
Pt dressed out by Sherilyn Cooter, RN. 1 bag of belongings secured.

## 2017-08-22 NOTE — ED Notes (Signed)
Pt. To BHU from ED ambulatory without difficulty, to room  BHU5. Report from [facility]. Pt. Is alert and oriented, warm and dry in no distress. Pt. Denies SI, and VH. Pt complaining of AH, patient states, "the voices are not telling me anything right now, just mumbling." Pt would not indulge in who he was having HI towards with this Clinical research associate. Patient able to verbally contract with this writer not to act out of any thought of hurting anyone. Pt. Calm and cooperative. Pt. Made aware of security cameras and Q15 minute rounds. Pt. Encouraged to let Nursing staff know of any concerns or needs.
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
## 2017-08-22 NOTE — ED Notes (Signed)
Report received from [facility]. Patient to be moved to B[facility] 5.

## 2017-08-22 NOTE — ED Notes (Signed)
Report to Selena Batten, [employer]

## 2017-08-22 NOTE — Consult Note (Signed)
[facility] Consult   Reason for Consult:  Consult for 41 year old man with a history of schizophrenia and substance abuse who came to the hospital voluntarily Referring Physician:  Cinda Quest Patient Identification: Fong Mccarry MRN:  [id number] Principal Diagnosis: Schizophrenia [facility]) Diagnosis:   Patient Active Problem List   Diagnosis Date Noted  . Schizophrenia ([place]) [F20.9] 02/16/2016  . 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 Alcohol abuse [F10.10] 02/16/2016  . Cocaine abuse ([place]) [F14.10] 02/16/2016    Total Time spent with patient: 1 hour  Subjective:   Garrick Midgley is a 41 y.o. male patient admitted with "I need my shot".  HPI:  Patient interviewed chart reviewed. 41 year old man came to the emergency room last night voluntarily. He said that he had been hearing voices and had been having agitated thoughts with thoughts at times of assaulting or hurting his girlfriend. On interview this morning the patient says he is already feeling better. The voices are not present today. He denies any thoughts of hurting his girlfriend or anyone else or hurting himself. Patient says he has been off of his psychiatric medicine for about 2 months. He missed his appointment at [facility] and so he did not get his last injection. Additionally he has relapsed into cocaine and alcohol use and has been using cocaine and alcohol about every other day recently. He acknowledges that this is a recurrent problem and obviously makes his symptoms worse. He is asking to have his injectable medicine given today. Patient says he has been having some trouble sleeping. No other acute physical problems.  Medical history: Outside of his mental health and substance abuse problems no significant ongoing medical issues  Substance abuse history: History of recurrent abuse of alcohol and cocaine primarily. Patient has enough insight to know that these are bad for him and make his behavior worse. He has  been able to maintain some sobriety in the past.  Social history: Currently living with his girlfriend. Not working regularly.  Past Psychiatric History: Established history of schizophrenia with several visits to the emergency room and some prior hospitalizations. No history of suicide attempts. Some agitation and aggression when psychotic. A lot of his psychosis like this time gets worse just when he is abusing drugs but also when he has been off his medicine. Patient is maintained on [place] which she has tolerated well and he is followed up at [facility].  Risk to Self: Suicidal Ideation: No Suicidal Intent: No Is patient at risk for suicide?: No Suicidal Plan?: No Access to Means: No What has been your use of drugs/alcohol within the last 12 months?: cocaine, cannabis Other Self Harm Risks: activie drug addiction Triggers for Past Attempts: None known Intentional Self Injurious Behavior: None Risk to Others: Homicidal Ideation: Yes-Currently Present Thoughts of Harm to Others: Yes-Currently Present Comment - Thoughts of Harm to Others: Pt reports auditory hallucinations telling him to hurt others Current Homicidal Intent: No Current Homicidal Plan: No Access to Homicidal Means: No History of harm to others?: No Assessment of Violence: None Noted Does patient have access to weapons?: No Criminal Charges Pending?: No Does patient have a court date: No Prior Inpatient Therapy: Prior Inpatient Therapy: No Prior Therapy Dates: na Prior Therapy Facilty/Provider(s): na Reason for Treatment: na Prior Outpatient Therapy: Prior Outpatient Therapy: Yes Prior Therapy Dates: current Prior Therapy Facilty/Provider(s): [facility] Reason for Treatment: substance/alcohol use Does patient have an ACCT team?: No Does patient have Intensive In-House Services?  : No Does patient  have [facility] services? : No Does patient have P4CC services?: No  Past Medical History:  Past  Medical History:  Diagnosis Date  . Bipolar 1 disorder ([place])   . Schizophrenia [facility])     Past Surgical History:  Procedure Laterality Date  . CYST REMOVAL HAND     neck   Family History: No family history on file. Family Psychiatric  History: He says his sister has a very similar mental health problem to what he has Social History:  History  Alcohol Use  . Yes    Comment: 3 x 25 oz every other day     History  Drug Use  . Types: Cocaine, Marijuana    Social History   Social History  . Marital status: Single    Spouse name: N/A  . Number of children: N/A  . Years of education: N/A   Social History Main Topics  . Smoking status: Current Every Day Smoker    Packs/day: 0.50    Types: Cigarettes  . Smokeless tobacco: Never Used  . Alcohol use Yes     Comment: 3 x 25 oz every other day  . Drug use: Yes    Types: Cocaine, Marijuana  . Sexual activity: Not Asked   Other Topics Concern  . None   Social History Narrative  . None   Additional Social History:    Allergies:  No Known Allergies  Labs:  Results for orders placed or performed during the hospital encounter of 08/22/17 (from the past 48 hour(s))  Comprehensive metabolic panel     Status: Abnormal   Collection Time: 08/22/17  2:47 AM  Result Value Ref Range   Sodium 140 135 - 145 mmol/L   Potassium 3.2 (L) 3.5 - 5.1 mmol/L   Chloride 106 101 - 111 mmol/L   CO2 23 22 - 32 mmol/L   Glucose, Bld 95 65 - 99 mg/dL   BUN 12 6 - 20 mg/dL   Creatinine, Ser 0.96 0.61 - 1.24 mg/dL   Calcium 9.1 8.9 - 10.3 mg/dL   Total Protein 8.5 (H) 6.5 - 8.1 g/dL   Albumin 4.3 3.5 - 5.0 g/dL   AST 41 15 - 41 U/L   ALT 44 17 - 63 U/L   Alkaline Phosphatase 32 (L) 38 - 126 U/L   Total Bilirubin 0.9 0.3 - 1.2 mg/dL   GFR calc non Af Amer >60 >60 mL/min   GFR calc Af Amer >60 >60 mL/min    Comment: (NOTE) The eGFR has been calculated using the CKD EPI equation. This calculation has not been validated in all clinical  situations. eGFR's persistently <60 mL/min signify possible Chronic Kidney Disease.    Anion gap 11 5 - 15  Ethanol     Status: None   Collection Time: 08/22/17  2:47 AM  Result Value Ref Range   Alcohol, Ethyl (B) <10 <10 mg/dL    Comment:        LOWEST DETECTABLE LIMIT FOR SERUM ALCOHOL IS 10 mg/dL FOR MEDICAL PURPOSES ONLY Please note change in reference range.   Salicylate level     Status: None   Collection Time: 08/22/17  2:47 AM  Result Value Ref Range   Salicylate Lvl <3.0 2.8 - 30.0 mg/dL  Acetaminophen level     Status: Abnormal   Collection Time: 08/22/17  2:47 AM  Result Value Ref Range   Acetaminophen (Tylenol), Serum <10 (L) 10 - 30 ug/mL    Comment:  THERAPEUTIC CONCENTRATIONS VARY SIGNIFICANTLY. A RANGE OF 10-30 ug/mL MAY BE AN EFFECTIVE CONCENTRATION FOR MANY PATIENTS. HOWEVER, SOME ARE BEST TREATED AT CONCENTRATIONS OUTSIDE THIS RANGE. ACETAMINOPHEN CONCENTRATIONS >150 ug/mL AT 4 HOURS AFTER INGESTION AND >50 ug/mL AT 12 HOURS AFTER INGESTION ARE OFTEN ASSOCIATED WITH TOXIC REACTIONS.   cbc     Status: Abnormal   Collection Time: 08/22/17  2:47 AM  Result Value Ref Range   WBC 11.6 (H) 3.8 - 10.6 K/uL   RBC 4.84 4.40 - 5.90 MIL/uL   Hemoglobin 14.9 13.0 - 18.0 g/dL   HCT 42.1 40.0 - 52.0 %   MCV 87.0 80.0 - 100.0 fL   MCH 30.7 26.0 - 34.0 pg   MCHC 35.3 32.0 - 36.0 g/dL   RDW 14.3 11.5 - 14.5 %   Platelets 195 150 - 440 K/uL  Urine Drug Screen, Qualitative     Status: Abnormal   Collection Time: 08/22/17  2:47 AM  Result Value Ref Range   Tricyclic, Ur Screen NONE DETECTED NONE DETECTED   Amphetamines, Ur Screen NONE DETECTED NONE DETECTED   MDMA (Ecstasy)Ur Screen NONE DETECTED NONE DETECTED   Cocaine Metabolite,Ur [place] POSITIVE (A) NONE DETECTED   Opiate, Ur Screen NONE DETECTED NONE DETECTED   Phencyclidine (PCP) Ur S NONE DETECTED NONE DETECTED   Cannabinoid 50 Ng, Ur [place] POSITIVE (A) NONE DETECTED   Barbiturates, Ur Screen NONE  DETECTED NONE DETECTED   Benzodiazepine, Ur Scrn NONE DETECTED NONE DETECTED   Methadone Scn, Ur NONE DETECTED NONE DETECTED    Comment: (NOTE) 024  Tricyclics, urine               Cutoff 1000 ng/mL 200  Amphetamines, urine             Cutoff 1000 ng/mL 300  MDMA (Ecstasy), urine           Cutoff 500 ng/mL 400  Cocaine Metabolite, urine       Cutoff 300 ng/mL 500  Opiate, urine                   Cutoff 300 ng/mL 600  Phencyclidine (PCP), urine      Cutoff 25 ng/mL 700  Cannabinoid, urine              Cutoff 50 ng/mL 800  Barbiturates, urine             Cutoff 200 ng/mL 900  Benzodiazepine, urine           Cutoff 200 ng/mL 1000 Methadone, urine                Cutoff 300 ng/mL 1100 1200 The urine drug screen provides only a preliminary, unconfirmed 1300 analytical test result and should not be used for non-medical 1400 purposes. Clinical consideration and professional judgment should 1500 be applied to any positive drug screen result due to possible 1600 interfering substances. A more specific alternate chemical method 1700 must be used in order to obtain a confirmed analytical result.  1800 Gas chromato graphy / mass spectrometry (GC/MS) is the preferred 1900 confirmatory method.     No current facility-administered medications for this encounter.    Current Outpatient Prescriptions  Medication Sig Dispense Refill  . diphenhydrAMINE (SOMINEX) 25 MG tablet Take 25 mg by mouth at bedtime as needed for sleep.    . Paliperidone Palmitate (INVEGA SUSTENNA IM) Inject 1 Dose into the muscle every 30 (thirty) days.      Musculoskeletal: Strength &  Muscle Tone: within normal limits Gait & Station: normal Patient leans: N/A  Psychiatric Specialty Exam: Physical Exam  Nursing note and vitals reviewed. Constitutional: He appears well-developed and well-nourished.  HENT:  Head: Normocephalic and atraumatic.  Eyes: Pupils are equal, round, and reactive to light. Conjunctivae are  normal.  Neck: Normal range of motion.  Cardiovascular: Regular rhythm and normal heart sounds.   Respiratory: Effort normal. No respiratory distress.  GI: Soft.  Musculoskeletal: Normal range of motion.  Neurological: He is alert.  Skin: Skin is warm and dry.  Psychiatric: He has a normal mood and affect. His speech is normal and behavior is normal. Judgment normal. Thought content is not paranoid and not delusional. Cognition and memory are normal. He expresses no homicidal and no suicidal ideation.    Review of Systems  Constitutional: Negative.   HENT: Negative.   Eyes: Negative.   Respiratory: Negative.   Cardiovascular: Negative.   Gastrointestinal: Negative.   Musculoskeletal: Negative.   Skin: Negative.   Neurological: Negative.   Psychiatric/Behavioral: Positive for hallucinations, memory loss and substance abuse. Negative for depression and suicidal ideas. The patient is not nervous/anxious and does not have insomnia.     Blood pressure 119/77, pulse 73, temperature 98 F (36.7 C), temperature source Oral, resp. rate 18, height '5\' 11"'$  (1.803 m), weight 104.3 kg (230 lb), SpO2 95 %.Body mass index is 32.08 kg/m.  General Appearance: Casual  Eye Contact:  Good  Speech:  Clear and Coherent  Volume:  Normal  Mood:  Euthymic  Affect:  Constricted  Thought Process:  Goal Directed  Orientation:  Full (Time, Place, and Person)  Thought Content:  Logical  Suicidal Thoughts:  No  Homicidal Thoughts:  No  Memory:  Immediate;   Fair Recent;   Fair Remote;   Fair  Judgement:  Fair  Insight:  Fair  Psychomotor Activity:  Normal  Concentration:  Concentration: Fair  Recall:  [employer] of Knowledge:  Fair  Language:  Fair  Akathisia:  No  Handed:  Right  AIMS (if indicated):     Assets:  Communication Skills Desire for Improvement Housing Physical Health  ADL's:  Intact  Cognition:  WNL  Sleep:        Treatment Plan Summary: Medication management and Plan  41 year old man with a history of schizophrenia and substance abuse. Today after getting a little rest he is already clearly better. He is not acting as if he is hearing voices. Denies any suicidal or homicidal ideation. Patient is requesting to get his long-acting shot. At this point he does not meet commitment criteria and does not appear to need inpatient treatment. Orders completed to give him his 256 mg Roland Earl injection after which she can be discharged and is to follow up with [facility]. Case reviewed with emergency room physician. Patient counseled on substance abuse issues. Case reviewed with TTS.  Disposition: Patient does not meet criteria for psychiatric inpatient admission. Supportive therapy provided about ongoing stressors.  Alethia Berthold, MD 08/22/2017 4:19 PM

## 2017-08-22 NOTE — ED Notes (Addendum)
Patient discharge to home. Patient given Gean Birchwood 234 mg IM in left deltoid. Tolerated well.  Patient alert, oriented and ambulatory at discharge. Patient denies any pain. AVS reviewed with patient and he verbalized understanding. Patient aware of crisis number to call if problems and written number given to patient. All patient belongings returned to him. Patient escorted to lobby to meet family by staff. Vitals 98.1-143/71-60-18-98% room air at discharge.

## 2017-08-22 NOTE — Discharge Instructions (Addendum)
follow-up with your doctor and follow the instructions Dr. Delaney Meigs gave you. take all of your medicines and return for any further problems

## 2017-08-22 NOTE — ED Notes (Signed)
Meal tray and drink given °

## 2017-08-22 NOTE — ED Notes (Signed)
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
## 2017-11-15 ENCOUNTER — Other Ambulatory Visit: Payer: Self-pay

## 2017-11-15 ENCOUNTER — Emergency Department
Admission: EM | Admit: 2017-11-15 | Discharge: 2017-11-16 | Disposition: A | Discharge status: Home / Self Care | Payer: Medicaid Other | Attending: Emergency Medicine | Admitting: Emergency Medicine

## 2017-11-15 DIAGNOSIS — F29 Unspecified psychosis not due to a substance or known physiological condition: Secondary | ICD-10-CM | POA: Diagnosis present

## 2017-11-15 DIAGNOSIS — F17210 Nicotine dependence, cigarettes, uncomplicated: Secondary | ICD-10-CM | POA: Diagnosis not present

## 2017-11-15 DIAGNOSIS — F203 Undifferentiated schizophrenia: Secondary | ICD-10-CM

## 2017-11-15 DIAGNOSIS — Z79899 Other long term (current) drug therapy: Secondary | ICD-10-CM | POA: Insufficient documentation

## 2017-11-15 DIAGNOSIS — F209 Schizophrenia, unspecified: Secondary | ICD-10-CM | POA: Diagnosis not present

## 2017-11-15 DIAGNOSIS — F1410 Cocaine abuse, uncomplicated: Secondary | ICD-10-CM | POA: Diagnosis present

## 2017-11-15 DIAGNOSIS — R45851 Suicidal ideations: Secondary | ICD-10-CM | POA: Diagnosis not present

## 2017-11-15 DIAGNOSIS — R440 Auditory hallucinations: Secondary | ICD-10-CM

## 2017-11-15 DIAGNOSIS — F1010 Alcohol abuse, uncomplicated: Secondary | ICD-10-CM | POA: Diagnosis present

## 2017-11-15 LAB — COMPREHENSIVE METABOLIC PANEL
ALBUMIN: 4.3 g/dL (ref 3.5–5.0)
ALT: 68 U/L — AB (ref 17–63)
AST: 60 U/L — AB (ref 15–41)
Alkaline Phosphatase: 42 U/L (ref 38–126)
Anion gap: 8 (ref 5–15)
BUN: 10 mg/dL (ref 6–20)
CO2: 23 mmol/L (ref 22–32)
CREATININE: 0.93 mg/dL (ref 0.61–1.24)
Calcium: 9.1 mg/dL (ref 8.9–10.3)
Chloride: 107 mmol/L (ref 101–111)
GFR calc Af Amer: >60 mL/min (ref >60)
GFR calc non Af Amer: >60 mL/min (ref >60)
GLUCOSE: 131 mg/dL — AB (ref 65–99)
Potassium: 3.6 mmol/L (ref 3.5–5.1)
SODIUM: 138 mmol/L (ref 135–145)
Total Bilirubin: 0.8 mg/dL (ref 0.3–1.2)
Total Protein: 8 g/dL (ref 6.5–8.1)

## 2017-11-15 LAB — CBC
HEMATOCRIT: 43.1 % (ref 40.0–52.0)
HEMOGLOBIN: 15.1 g/dL (ref 13.0–18.0)
MCH: 30.3 pg (ref 26.0–34.0)
MCHC: 35 g/dL (ref 32.0–36.0)
MCV: 86.6 fL (ref 80.0–100.0)
Platelets: 192 10*3/uL (ref 150–440)
RBC: 4.98 MIL/uL (ref 4.40–5.90)
RDW: 15 % — ABNORMAL HIGH (ref 11.5–14.5)
WBC: 9 10*3/uL (ref 3.8–10.6)

## 2017-11-15 LAB — URINE DRUG SCREEN, QUALITATIVE (ARMC ONLY)
Amphetamines, Ur Screen: NOT DETECTED
BARBITURATES, UR SCREEN: NOT DETECTED
Benzodiazepine, Ur Scrn: NOT DETECTED
COCAINE METABOLITE, UR ~~LOC~~: POSITIVE — AB
Cannabinoid 50 Ng, Ur ~~LOC~~: POSITIVE — AB
MDMA (Ecstasy)Ur Screen: NOT DETECTED
Methadone Scn, Ur: NOT DETECTED
OPIATE, UR SCREEN: NOT DETECTED
Phencyclidine (PCP) Ur S: NOT DETECTED
Tricyclic, Ur Screen: NOT DETECTED

## 2017-11-15 LAB — ACETAMINOPHEN LEVEL

## 2017-11-15 LAB — ETHANOL: Alcohol, Ethyl (B): <10 mg/dL (ref <10)

## 2017-11-15 LAB — SALICYLATE LEVEL: Salicylate Lvl: <7 mg/dL (ref 2.8–30.0)

## 2017-11-15 MED ORDER — PALIPERIDONE ER 3 MG PO TB24
6.0000 mg | ORAL_TABLET | Freq: Every day | ORAL | Status: DC
  Administered 2017-11-15 – 2017-11-16 (×2): 6 mg via ORAL
  Filled 2017-11-15 (×2): qty 2
  Filled 2017-11-15: qty 1

## 2017-11-15 MED ORDER — ZIPRASIDONE HCL 20 MG PO CAPS
20.0000 mg | ORAL_CAPSULE | Freq: Two times a day (BID) | ORAL | Status: DC
  Administered 2017-11-15: 20 mg via ORAL
  Filled 2017-11-15: qty 1

## 2017-11-15 MED ORDER — PALIPERIDONE PALMITATE 234 MG/1.5ML IM SUSP
234.0000 mg | Freq: Once | INTRAMUSCULAR | Status: AC
  Administered 2017-11-15: 234 mg via INTRAMUSCULAR
  Filled 2017-11-15: qty 1.5

## 2017-11-15 NOTE — ED Notes (Signed)
Pt. Had call from girlfriend.  Girlfriend and sister wanted to know status of patient.  Family gave phone # [phone] for patient to call in morning when he has chance to make call.

## 2017-11-15 NOTE — ED Notes (Signed)
ED Provider at bedside. 

## 2017-11-15 NOTE — ED Triage Notes (Signed)
Pt arrives voluntarily for SI thoughts. Denies HI. States voices in head are telling him to hurt himself. Pt denies having a plan. States SI x 2 days.  Pt is alert, oriented, tapping leg.   Pt takes a medication for mental illness but unsure how to spell it. [facility]. Last time pt took medication 1 month ago.

## 2017-11-15 NOTE — ED Notes (Signed)
Pt eating at this time.

## 2017-11-15 NOTE — ED Notes (Signed)
IVC/Consult ordered/Jordan RN and ODS [place] aware

## 2017-11-15 NOTE — ED Provider Notes (Signed)
[facility] Emergency Department Provider Note  ____________________________________________  Time seen: Approximately 7:38 PM  I have reviewed the triage vital signs and the nursing notes.   HISTORY  Chief Complaint Suicidal    HPI Dominic [name] is a 42 y.o. male who complains of suicidal ideation in the setting of command auditory hallucinations that tell him to kill himself. He fears for his safety. Denies HI or visual hallucinations. Reports that he normally gets a muscle injection once a month for his mental health issues, but hasn't been in a while due to a "change in peer support".Denies any other complaints.  Symptoms of a constant, gradual onset, moderate to severe in intensity, no aggravating or alleviating factors.     Past Medical History:  Diagnosis Date  . Bipolar 1 disorder ([facility])   . Schizophrenia [facility])      Patient Active Problem List   Diagnosis Date Noted  . Schizophrenia ([facility]) 02/16/2016  . 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 Alcohol abuse 02/16/2016  . Cocaine abuse ([facility]) 02/16/2016     Past Surgical History:  Procedure Laterality Date  . CYST REMOVAL HAND     neck     Prior to Admission medications   Medication Sig Start Date End Date Taking? Authorizing Provider  diphenhydrAMINE (SOMINEX) 25 MG tablet Take 25 mg by mouth at bedtime as needed for sleep.    [provider]  Paliperidone Palmitate (INVEGA SUSTENNA IM) Inject 1 Dose into the muscle every 30 (thirty) days.    [provider]     Allergies Patient has no known allergies.   History reviewed. No pertinent family history.  Social History Social History   Tobacco Use  . Smoking status: Current Every Day Smoker    Packs/day: 0.50    Types: Cigarettes  . Smokeless tobacco: Never Used  Substance Use Topics  . Alcohol use: Yes    Comment: 3 x 25 oz every other day  . Drug use: Yes    Types: Cocaine, Marijuana    Review of  Systems  Constitutional:   No fever or chills.   Cardiovascular:   No chest pain or syncope. Respiratory:   No dyspnea or cough. Gastrointestinal:   Negative for abdominal pain, vomiting and diarrhea.  Musculoskeletal:   Negative for focal pain or swelling All other systems reviewed and are negative except as documented above in ROS and HPI.  ____________________________________________   PHYSICAL EXAM:  VITAL SIGNS: ED Triage Vitals  Enc Vitals Group     BP 11/15/17 1655 131/75     Pulse Rate 11/15/17 1655 68     Resp 11/15/17 1655 18     Temp 11/15/17 1655 98.9 F (37.2 C)     Temp Source 11/15/17 1655 Oral     SpO2 11/15/17 1655 95 %     Weight 11/15/17 1656 235 lb (106.6 kg)     Height 11/15/17 1656 6' (1.829 m)     Head Circumference --      Peak Flow --      Pain Score 11/15/17 1655 8     Pain Loc --      Pain Edu? --      Excl. in GC? --     Vital signs reviewed, nursing assessments reviewed.   Constitutional:   Alert and oriented. Well appearing and in no distress. Eyes:   No scleral icterus.  EOMI. No nystagmus. ENT   Head:   Normocephalic and atraumatic.   Nose:  No congestion/rhinnorhea.    Mouth/Throat:   MMM, no pharyngeal erythema. No peritonsillar mass.    Neck:   No meningismus. Full ROM. Cardiovascular:   RRR. Symmetric bilateral radial and DP pulses.  No murmurs.  Respiratory:   Normal respiratory effort without tachypnea/retractions. Breath sounds are clear and equal bilaterally. No wheezes/rales/rhonchi. Gastrointestinal:   Soft and nontender. Non distended. There is no CVA tenderness.  No rebound, rigidity, or guarding. Genitourinary:   deferred Musculoskeletal:   Normal range of motion in all extremities. No joint effusions.  No lower extremity tenderness.  No edema. Neurologic:   Normal speech and language.  Motor grossly intact. No gross focal neurologic deficits are appreciated.  Skin:    Skin is warm, dry and intact. No  injuries  ____________________________________________    LABS (pertinent positives/negatives) (all labs ordered are listed, but only abnormal results are displayed) Labs Reviewed  COMPREHENSIVE METABOLIC PANEL - Abnormal; Notable for the following components:      Result Value   Glucose, Bld 131 (*)    AST 60 (*)    ALT 68 (*)    All other components within normal limits  ACETAMINOPHEN LEVEL - Abnormal; Notable for the following components:   Acetaminophen (Tylenol), Serum <10 (*)    All other components within normal limits  CBC - Abnormal; Notable for the following components:   RDW 15.0 (*)    All other components within normal limits  URINE DRUG SCREEN, QUALITATIVE ([facility] ONLY) - Abnormal; Notable for the following components:   Cocaine Metabolite,Ur [place] POSITIVE (*)    Cannabinoid 50 Ng, Ur [place] POSITIVE (*)    All other components within normal limits  ETHANOL  SALICYLATE LEVEL   ____________________________________________   EKG    ____________________________________________    RADIOLOGY  No results found.  ____________________________________________   PROCEDURES Procedures  ____________________________________________     CLINICAL IMPRESSION / ASSESSMENT AND PLAN / ED COURSE  Pertinent labs & imaging results that were available during my care of the patient were reviewed by me and considered in my medical decision making (see chart for details).   Patient well-appearing no acute distress, normal vital signs, presents with acute psychosis with suicidal ideation. Patient is well-known to this emergency department, has presentations like this in the past that are due to medication noncompliance versus drug-induced psychosis. UDS is positive for cocaine. Give the patient oral Geodon for now, IVC, psychiatry consult. If improved tomorrow, perhaps he can receive his Gean Birchwood injection and be suitable for outpatient follow-up. He is medically stable at  this time.      ____________________________________________   FINAL CLINICAL IMPRESSION(S) / ED DIAGNOSES    Final diagnoses:  Auditory hallucinations  Suicidal ideation      This SmartLink is deprecated. Use AVSMEDLIST instead to display the medication list for a patient.   Portions of this note were generated with dragon dictation software. Dictation errors may occur despite best attempts at proofreading.    Sharman Cheek, MD 11/15/17 1946

## 2017-11-15 NOTE — ED Notes (Signed)
Per MD and secretary pt is to be IVC at this time

## 2017-11-15 NOTE — ED Notes (Signed)
Pt given sandwich tray 

## 2017-11-15 NOTE — BH Assessment (Signed)
Assessment Note  Dominic [name] is an 42 y.o. male presenting to the ED for concerns with suicidal ideations without plan or intent.  Patient reports auditory hallucinations telling him to harm himself but no plan.  He reports that his symptoms have been occurring for the past couple of weeks,  Pt reports he is supposed to be his [place] shot but has not had it for over a month.  Pt self medicates with alcohol and cocaine. Pt denies HI and any visual hallucinations.  Diagnosis: Schizophrenia  Past Medical History:  Past Medical History:  Diagnosis Date  . Bipolar 1 disorder (HCC)   . Schizophrenia [facility])     Past Surgical History:  Procedure Laterality Date  . CYST REMOVAL HAND     neck    Family History: History reviewed. No pertinent family history.  Social History:  reports that he has been smoking cigarettes.  He has been smoking about 0.50 packs per day. he has never used smokeless tobacco. He reports that he drinks alcohol. He reports that he uses drugs. Drugs: Cocaine and Marijuana.  Additional Social History:  Alcohol / Drug Use Pain Medications: See PTA Prescriptions: See PTA Over the Counter: See PTA History of alcohol / drug use?: Yes(Pt has a history of alcohol and cocaine use)  CIWA: CIWA-Ar BP: 131/75 Pulse Rate: 68 COWS:    Allergies: No Known Allergies  Home Medications:  (Not in a hospital admission)  OB/GYN Status:  No LMP for male patient.  General Assessment Data Location of Assessment: [facility] ED TTS Assessment: In system Is this a Tele or Face-to-Face Assessment?: Face-to-Face Is this an Initial Assessment or a Re-assessment for this encounter?: Initial Assessment Marital status: Single Maiden name: n/a Is patient pregnant?: No Pregnancy Status: No Living Arrangements: Non-relatives/Friends Can pt return to current living arrangement?: Yes Admission Status: Voluntary Is patient capable of signing voluntary admission?: Yes Referral Source:  Self/Family/Friend Insurance type: Medicaid     Crisis Care Plan Living Arrangements: Non-relatives/Friends Legal Guardian: Other:(self) Name of Psychiatrist: none reported Name of Therapist: none reported  Education Status Is patient currently in school?: No Current Grade: n/a Highest grade of school patient has completed: hs Name of school: n/a Contact person: n/a  Risk to self with the past 6 months Suicidal Ideation: Yes-Currently Present Has patient been a risk to self within the past 6 months prior to admission? : No Suicidal Intent: No Has patient had any suicidal intent within the past 6 months prior to admission? : No Is patient at risk for suicide?: No Suicidal Plan?: No Has patient had any suicidal plan within the past 6 months prior to admission? : No Access to Means: No What has been your use of drugs/alcohol within the last 12 months?: alcohol and cocaine Previous Attempts/Gestures: No Other Self Harm Risks: polysubstance addiction Triggers for Past Attempts: None known Intentional Self Injurious Behavior: None Family Suicide History: No Recent stressful life event(s): Conflict (Comment)(relationship issues with girlfriend) Persecutory voices/beliefs?: No Depression: Yes Depression Symptoms: Loss of interest in usual pleasures, Feeling worthless/self pity Substance abuse history and/or treatment for substance abuse?: Yes Suicide prevention information given to non-admitted patients: Not applicable  Risk to Others within the past 6 months Homicidal Ideation: No Does patient have any lifetime risk of violence toward others beyond the six months prior to admission? : No Thoughts of Harm to Others: No Current Homicidal Intent: No Current Homicidal Plan: No Access to Homicidal Means: No Identified Victim: none identified History of harm  to others?: No Assessment of Violence: None Noted Does patient have access to weapons?: No Criminal Charges Pending?:  No Does patient have a court date: No Is patient on probation?: No  Psychosis Hallucinations: Auditory Delusions: None noted  Mental Status Report Appearance/Hygiene: In scrubs Eye Contact: Good Motor Activity: Freedom of movement Speech: Logical/coherent Level of Consciousness: Sedated Mood: Depressed Affect: Constricted Anxiety Level: Minimal Thought Processes: Coherent Judgement: Partial Orientation: Person, Place, Time, Situation Obsessive Compulsive Thoughts/Behaviors: None  Cognitive Functioning Concentration: Fair Memory: Recent Intact, Remote Intact IQ: Average Insight: Fair Impulse Control: Fair Appetite: Fair Weight Loss: 0 Weight Gain: 0 Sleep: Decreased Vegetative Symptoms: None  ADLScreening [facility] Assessment Services) Patient's cognitive ability adequate to safely complete daily activities?: Yes Patient able to express need for assistance with ADLs?: Yes Independently performs ADLs?: Yes (appropriate for developmental age)  Prior Inpatient Therapy Prior Inpatient Therapy: Yes Prior Therapy Dates: unknown Prior Therapy Facilty/Provider(s): unknown Reason for Treatment: substance abuse  Prior Outpatient Therapy Prior Outpatient Therapy: Yes Prior Therapy Dates: unknown Prior Therapy Facilty/Provider(s): unknown Reason for Treatment: schizophrenia Does patient have an ACCT team?: Unknown Does patient have Intensive In-House Services?  : No Does patient have [facility] services? : No Does patient have P4CC services?: No  ADL Screening (condition at time of admission) Patient's cognitive ability adequate to safely complete daily activities?: Yes Patient able to express need for assistance with ADLs?: Yes Independently performs ADLs?: Yes (appropriate for developmental age)       Abuse/Neglect Assessment (Assessment to be complete while patient is alone) Abuse/Neglect Assessment Can Be Completed: Yes Physical Abuse: Denies Verbal Abuse:  Denies Sexual Abuse: Denies Exploitation of patient/patient's resources: Denies Self-Neglect: Denies Values / Beliefs Cultural Requests During Hospitalization: None Spiritual Requests During Hospitalization: None Consults Spiritual Care Consult Needed: No Social Work Consult Needed: No Merchant navy officerAdvance Directives (For Healthcare) Does Patient Have a Medical Advance Directive?: No Would patient like information on creating a medical advance directive?: No - Patient declined    Additional Information 1:1 In Past 12 Months?: No CIRT Risk: No Elopement Risk: No Does patient have medical clearance?: Yes     Disposition:  Disposition Initial Assessment Completed for this Encounter: Yes Disposition of Patient: Re-evaluation by Psychiatry recommended  On Site Evaluation by:   Reviewed with Physician:    Artist Beachoxana C Quron Ruddy 11/15/2017 11:03 PM

## 2017-11-15 NOTE — Consult Note (Signed)
[facility] Consult   Reason for Consult: Consult for 42 year old man with a history of schizophrenia and substance abuse who comes to the emergency room saying he is suicidal Referring Physician: Joni Fears Patient Identification: Alpha Mysliwiec MRN:  [id number] Principal Diagnosis: Schizophrenia [facility]) Diagnosis:   Patient Active Problem List   Diagnosis Date Noted  . Schizophrenia ([place]) [F20.9] 02/16/2016  . 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 Alcohol abuse [F10.10] 02/16/2016  . Cocaine abuse ([place]) [F14.10] 02/16/2016    Total Time spent with patient: 1 hour  Subjective:   Arturo Sofranko is a 42 y.o. male patient admitted with "the voices".  HPI: Patient seen chart reviewed.  42 year old man says that his voices have been bad for a couple weeks now.  For the last couple days they have been telling him that he should kill himself.  He can sleep at night.  His mood is depressed and angry.  He says he has not had his medicine for it at least a month.  He also admits that he has been drinking and using cocaine although he says he did not have any today.  It sounds like he is having trouble getting along with his girlfriend as well.  Patient is calm and cooperative but requesting to restart his psychiatric medicine.  Medical history: No significant medical problems apart from his mental health  Social history: Lives with his fiance.  Not currently working  Substance abuse history: Established history of abuse of alcohol and cocaine no history of DTs or seizures  Past Psychiatric History: Patient has a diagnosis of schizophrenia and was being treated with long-acting injectable medication.  He estimates its been about a month since he last had the shot which is usually the appropriate time interval.  He says he has also not been taking any oral medicine.  He does have a past history of admissions and a past history of suicide attempts  Risk to Self: Is patient at risk  for suicide?: Yes Risk to Others:   Prior Inpatient Therapy:   Prior Outpatient Therapy:    Past Medical History:  Past Medical History:  Diagnosis Date  . Bipolar 1 disorder ([place])   . Schizophrenia [facility])     Past Surgical History:  Procedure Laterality Date  . CYST REMOVAL HAND     neck   Family History: History reviewed. No pertinent family history. Family Psychiatric  History: Negative Social History:  Social History   Substance and Sexual Activity  Alcohol Use Yes   Comment: 3 x 25 oz every other day     Social History   Substance and Sexual Activity  Drug Use Yes  . Types: Cocaine, Marijuana    Social History   Socioeconomic History  . Marital status: Single    Spouse name: None  . Number of children: None  . Years of education: None  . Highest education level: None  Social Needs  . Financial resource strain: None  . Food insecurity - worry: None  . Food insecurity - inability: None  . Transportation needs - medical: None  . Transportation needs - non-medical: None  Occupational History  . None  Tobacco Use  . Smoking status: Current Every Day Smoker    Packs/day: 0.50    Types: Cigarettes  . Smokeless tobacco: Never Used  Substance and Sexual Activity  . Alcohol use: Yes    Comment: 3 x 25 oz every other day  . Drug use: Yes    Types:  Cocaine, Marijuana  . Sexual activity: None  Other Topics Concern  . None  Social History Narrative  . None   Additional Social History:    Allergies:  No Known Allergies  Labs:  Results for orders placed or performed during the hospital encounter of 11/15/17 (from the past 48 hour(s))  Comprehensive metabolic panel     Status: Abnormal   Collection Time: 11/15/17  5:05 PM  Result Value Ref Range   Sodium 138 135 - 145 mmol/L   Potassium 3.6 3.5 - 5.1 mmol/L   Chloride 107 101 - 111 mmol/L   CO2 23 22 - 32 mmol/L   Glucose, Bld 131 (H) 65 - 99 mg/dL   BUN 10 6 - 20 mg/dL   Creatinine, Ser 0.93 0.61 -  1.24 mg/dL   Calcium 9.1 8.9 - 10.3 mg/dL   Total Protein 8.0 6.5 - 8.1 g/dL   Albumin 4.3 3.5 - 5.0 g/dL   AST 60 (H) 15 - 41 U/L   ALT 68 (H) 17 - 63 U/L   Alkaline Phosphatase 42 38 - 126 U/L   Total Bilirubin 0.8 0.3 - 1.2 mg/dL   GFR calc non Af Amer >60 >60 mL/min   GFR calc Af Amer >60 >60 mL/min    Comment: (NOTE) The eGFR has been calculated using the CKD EPI equation. This calculation has not been validated in all clinical situations. eGFR's persistently <60 mL/min signify possible Chronic Kidney Disease.    Anion gap 8 5 - 15    Comment: Performed at [facility], [address] [place], [place] [address]  Ethanol     Status: None   Collection Time: 11/15/17  5:05 PM  Result Value Ref Range   Alcohol, Ethyl (B) <10 <10 mg/dL    Comment:        LOWEST DETECTABLE LIMIT FOR SERUM ALCOHOL IS 10 mg/dL FOR MEDICAL PURPOSES ONLY Performed at [facility], [address] [place], [place] [address]   Salicylate level     Status: None   Collection Time: 11/15/17  5:05 PM  Result Value Ref Range   Salicylate Lvl <6.7 2.8 - 30.0 mg/dL    Comment: Performed at [facility], [address] [place], [place] [address]  Acetaminophen level     Status: Abnormal   Collection Time: 11/15/17  5:05 PM  Result Value Ref Range   Acetaminophen (Tylenol), Serum <10 (L) 10 - 30 ug/mL    Comment:        THERAPEUTIC CONCENTRATIONS VARY SIGNIFICANTLY. A RANGE OF 10-30 ug/mL MAY BE AN EFFECTIVE CONCENTRATION FOR MANY PATIENTS. HOWEVER, SOME ARE BEST TREATED AT CONCENTRATIONS OUTSIDE THIS RANGE. ACETAMINOPHEN CONCENTRATIONS >150 ug/mL AT 4 HOURS AFTER INGESTION AND >50 ug/mL AT 12 HOURS AFTER INGESTION ARE OFTEN ASSOCIATED WITH TOXIC REACTIONS. Performed at [facility], [address] [place], [place] [address]   cbc     Status: Abnormal   Collection Time: 11/15/17  5:05 PM  Result Value Ref Range   WBC 9.0 3.8 - 10.6 K/uL   RBC 4.98  4.40 - 5.90 MIL/uL   Hemoglobin 15.1 13.0 - 18.0 g/dL   HCT 43.1 40.0 - 52.0 %   MCV 86.6 80.0 - 100.0 fL   MCH 30.3 26.0 - 34.0 pg   MCHC 35.0 32.0 - 36.0 g/dL   RDW 15.0 (H) 11.5 - 14.5 %   Platelets 192 150 - 440 K/uL    Comment: Performed at [facility],  [place], [place] [address]  Urine Drug Screen, Qualitative     Status: Abnormal   Collection Time: 11/15/17  5:05 PM  Result Value Ref Range   Tricyclic, Ur Screen NONE DETECTED NONE DETECTED   Amphetamines, Ur Screen NONE DETECTED NONE DETECTED   MDMA (Ecstasy)Ur Screen NONE DETECTED NONE DETECTED   Cocaine Metabolite,Ur [place] POSITIVE (A) NONE DETECTED   Opiate, Ur Screen NONE DETECTED NONE DETECTED   Phencyclidine (PCP) Ur S NONE DETECTED NONE DETECTED   Cannabinoid 50 Ng, Ur [place] POSITIVE (A) NONE DETECTED   Barbiturates, Ur Screen NONE DETECTED NONE DETECTED   Benzodiazepine, Ur Scrn NONE DETECTED NONE DETECTED   Methadone Scn, Ur NONE DETECTED NONE DETECTED    Comment: (NOTE) Tricyclics + metabolites, urine    Cutoff 1000 ng/mL Amphetamines + metabolites, urine  Cutoff 1000 ng/mL MDMA (Ecstasy), urine              Cutoff 500 ng/mL Cocaine Metabolite, urine          Cutoff 300 ng/mL Opiate + metabolites, urine        Cutoff 300 ng/mL Phencyclidine (PCP), urine         Cutoff 25 ng/mL Cannabinoid, urine                 Cutoff 50 ng/mL Barbiturates + metabolites, urine  Cutoff 200 ng/mL Benzodiazepine, urine              Cutoff 200 ng/mL Methadone, urine                   Cutoff 300 ng/mL The urine drug screen provides only a preliminary, unconfirmed analytical test result and should not be used for non-medical purposes. Clinical consideration and professional judgment should be applied to any positive drug screen result due to possible interfering substances. A more specific alternate chemical method must be used in order to obtain a confirmed analytical result. Gas chromatography / mass  spectrometry (GC/MS) is the preferred confirmat ory method. Performed at [facility], [address], [place], [place] [address]     Current Facility-Administered Medications  Medication Dose Route Frequency Provider Last Rate Last Dose  . paliperidone (INVEGA SUSTENNA) injection 234 mg  234 mg Intramuscular Once Hadiyah Maricle T, MD      . paliperidone (INVEGA) 24 hr tablet 6 mg  6 mg Oral Daily Dorenda Pfannenstiel, [name], MD       Current Outpatient Medications  Medication Sig Dispense Refill  . diphenhydrAMINE (SOMINEX) 25 MG tablet Take 25 mg by mouth at bedtime as needed for sleep.    . Paliperidone Palmitate (INVEGA SUSTENNA IM) Inject 1 Dose into the muscle every 30 (thirty) days.      Musculoskeletal: Strength & Muscle Tone: within normal limits Gait & Station: normal Patient leans: N/A  Psychiatric Specialty Exam: Physical Exam  Nursing note and vitals reviewed. Constitutional: He appears well-developed and well-nourished.  HENT:  Head: Normocephalic and atraumatic.  Eyes: Conjunctivae are normal. Pupils are equal, round, and reactive to light.  Neck: Normal range of motion.  Cardiovascular: Regular rhythm and normal heart sounds.  Respiratory: Effort normal. No respiratory distress.  GI: Soft.  Musculoskeletal: Normal range of motion.  Neurological: He is alert.  Skin: Skin is warm and dry.  Psychiatric: Judgment normal. His affect is blunt. His speech is delayed. He is slowed. Cognition and memory are normal. He expresses suicidal ideation.    Review of Systems  Constitutional: Negative.  HENT: Negative.   Eyes: Negative.   Respiratory: Negative.   Cardiovascular: Negative.   Gastrointestinal: Negative.   Musculoskeletal: Negative.   Skin: Negative.   Neurological: Negative.   Psychiatric/Behavioral: Positive for depression, hallucinations, substance abuse and suicidal ideas. Negative for memory loss. The patient has insomnia. The patient is not  nervous/anxious.     Blood pressure 131/75, pulse 68, temperature 98.9 F (37.2 C), temperature source Oral, resp. rate 18, height 6' (1.829 m), weight 235 lb (106.6 kg), SpO2 95 %.Body mass index is 31.87 kg/m.  General Appearance: Fairly Groomed  Eye Contact:  Good  Speech:  Clear and Coherent  Volume:  Normal  Mood:  Depressed  Affect:  Constricted  Thought Process:  Coherent  Orientation:  Full (Time, Place, and Person)  Thought Content:  Logical and Hallucinations: Auditory  Suicidal Thoughts:  Yes.  with intent/plan  Homicidal Thoughts:  No  Memory:  Immediate;   Fair Recent;   Fair Remote;   Fair  Judgement:  Fair  Insight:  Fair  Psychomotor Activity:  Decreased  Concentration:  Concentration: Fair  Recall:  [employer] of Knowledge:  Fair  Language:  Fair  Akathisia:  No  Handed:  Right  AIMS (if indicated):     Assets:  Desire for Improvement Housing Physical Health Resilience Social Support  ADL's:  Intact  Cognition:  WNL  Sleep:        Treatment Plan Summary: Daily contact with patient to assess and evaluate symptoms and progress in treatment, Medication management and Plan 58-year-old man reporting he is off his medicine and having hallucinations with suicidal ideation.  He is calm and cooperative right now.  Orders placed to get him a long-acting shot of Invega and also restart the Invega oral medicine.  We do not have any beds downstairs right now.  We will have to reevaluate tomorrow to see whether he still needs hospitalization.  Based on his past history there is a chance that once he gets his medicine and get some rest he might no longer be feeling "suicidal".  15-minute checks in place.  Continue current observation for now.  Labs are still pending.  Disposition: Recommend psychiatric Inpatient admission when medically cleared. Supportive therapy provided about ongoing stressors.  Alethia Berthold, MD 11/15/2017 7:32 PM

## 2017-11-15 NOTE — ED Notes (Signed)
Called pharmacy about Invega injection.  Medication must be picked up downstairs.

## 2017-11-15 NOTE — ED Notes (Signed)
VOL  

## 2017-11-15 NOTE — ED Notes (Signed)
[name] and then [facility].

## 2017-11-15 NOTE — ED Notes (Signed)
Pt. Transferred with security from [facility] 20[facility] to room #6 in [facility].  Pt. Calm and cooperative.  Pt. Advised of security cameras in unit and 15 min. Safety checks.

## 2017-11-16 NOTE — ED Provider Notes (Signed)
-----------------------------------------   2:17 PM on 11/16/2017 -----------------------------------------   Blood pressure 126/79, pulse 65, temperature 98.4 F (36.9 C), temperature source Oral, resp. rate 18, height 6' (1.829 m), weight 106.6 kg (235 lb), SpO2 100 %.  The patient had no acute events since last update.  Calm and cooperative at this time.   Patient evaluated by Dr. Toni Amendlapacs and deemed appropriate for discharge to home at this time.  The patient without any suicidal or homicidal ideation currently.  [facility]   Myrna BlazerSchaevitz, Antonea Gaut Matthew, MD 11/16/17 [phone]

## 2017-11-16 NOTE — Consult Note (Signed)
[facility] Consult   Reason for Consult:  Follow-up consult for 42 year old man with history of schizophrenia. Referring Physician:  Clearnce Hasten Patient Identification: Dominic [name] MRN:  [id number] Principal Diagnosis: Schizophrenia [facility]) Diagnosis:   Patient Active Problem List   Diagnosis Date Noted  . Schizophrenia ([place]) [F20.9] 02/16/2016  . 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 Alcohol abuse [F10.10] 02/16/2016  . Cocaine abuse ([place]) [F14.10] 02/16/2016    Total Time spent with patient: 30 minutes  Subjective:   Dominic [name] is a 42 y.o. male patient admitted with Patient reinterviewed today.  He reports that his mood is much better.  Now denies any hallucinations.  He is smiling and upbeat.  Denies suicidal or homicidal thoughts.  Says he tolerated the medicine well.  No longer having any symptoms.Marland Kitchen  HPI:  See previous note.  42 year old man with a history of schizophrenia and substance abuse presented with positive drug screen and reports of hallucinations and suicidal thoughts.  He was given medication overnight and is now reporting improvement  Past Psychiatric History: History of similar presentations and treatment with long-acting injectables  Risk to Self: Suicidal Ideation: Yes-Currently Present Suicidal Intent: No Is patient at risk for suicide?: No Suicidal Plan?: No Access to Means: No What has been your use of drugs/alcohol within the last 12 months?: alcohol and cocaine Other Self Harm Risks: polysubstance addiction Triggers for Past Attempts: None known Intentional Self Injurious Behavior: None Risk to Others: Homicidal Ideation: No Thoughts of Harm to Others: No Current Homicidal Intent: No Current Homicidal Plan: No Access to Homicidal Means: No Identified Victim: none identified History of harm to others?: No Assessment of Violence: None Noted Does patient have access to weapons?: No Criminal Charges Pending?: No Does patient  have a court date: No Prior Inpatient Therapy: Prior Inpatient Therapy: Yes Prior Therapy Dates: unknown Prior Therapy Facilty/Provider(s): unknown Reason for Treatment: substance abuse Prior Outpatient Therapy: Prior Outpatient Therapy: Yes Prior Therapy Dates: unknown Prior Therapy Facilty/Provider(s): unknown Reason for Treatment: schizophrenia Does patient have an ACCT team?: Unknown Does patient have Intensive In-House Services?  : No Does patient have [facility] services? : No Does patient have P4CC services?: No  Past Medical History:  Past Medical History:  Diagnosis Date  . Bipolar 1 disorder ([place])   . Schizophrenia [facility])     Past Surgical History:  Procedure Laterality Date  . CYST REMOVAL HAND     neck   Family History: History reviewed. No pertinent family history. Family Psychiatric  History: None identified Social History:  Social History   Substance and Sexual Activity  Alcohol Use Yes   Comment: 3 x 25 oz every other day     Social History   Substance and Sexual Activity  Drug Use Yes  . Types: Cocaine, Marijuana    Social History   Socioeconomic History  . Marital status: Single    Spouse name: None  . Number of children: None  . Years of education: None  . Highest education level: None  Social Needs  . Financial resource strain: None  . Food insecurity - worry: None  . Food insecurity - inability: None  . Transportation needs - medical: None  . Transportation needs - non-medical: None  Occupational History  . None  Tobacco Use  . Smoking status: Current Every Day Smoker    Packs/day: 0.50    Types: Cigarettes  . Smokeless tobacco: Never Used  Substance and Sexual Activity  . Alcohol use: Yes  Comment: 3 x 25 oz every other day  . Drug use: Yes    Types: Cocaine, Marijuana  . Sexual activity: None  Other Topics Concern  . None  Social History Narrative  . None   Additional Social History:    Allergies:  No Known  Allergies  Labs:  Results for orders placed or performed during the hospital encounter of 11/15/17 (from the past 48 hour(s))  Comprehensive metabolic panel     Status: Abnormal   Collection Time: 11/15/17  5:05 PM  Result Value Ref Range   Sodium 138 135 - 145 mmol/L   Potassium 3.6 3.5 - 5.1 mmol/L   Chloride 107 101 - 111 mmol/L   CO2 23 22 - 32 mmol/L   Glucose, Bld 131 (H) 65 - 99 mg/dL   BUN 10 6 - 20 mg/dL   Creatinine, Ser 0.93 0.61 - 1.24 mg/dL   Calcium 9.1 8.9 - 10.3 mg/dL   Total Protein 8.0 6.5 - 8.1 g/dL   Albumin 4.3 3.5 - 5.0 g/dL   AST 60 (H) 15 - 41 U/L   ALT 68 (H) 17 - 63 U/L   Alkaline Phosphatase 42 38 - 126 U/L   Total Bilirubin 0.8 0.3 - 1.2 mg/dL   GFR calc non Af Amer >60 >60 mL/min   GFR calc Af Amer >60 >60 mL/min    Comment: (NOTE) The eGFR has been calculated using the CKD EPI equation. This calculation has not been validated in all clinical situations. eGFR's persistently <60 mL/min signify possible Chronic Kidney Disease.    Anion gap 8 5 - 15    Comment: Performed at [facility], [address] [place], [place] [address]  Ethanol     Status: None   Collection Time: 11/15/17  5:05 PM  Result Value Ref Range   Alcohol, Ethyl (B) <10 <10 mg/dL    Comment:        LOWEST DETECTABLE LIMIT FOR SERUM ALCOHOL IS 10 mg/dL FOR MEDICAL PURPOSES ONLY Performed at [facility], [address] [place], [place] [address]   Salicylate level     Status: None   Collection Time: 11/15/17  5:05 PM  Result Value Ref Range   Salicylate Lvl <0.2 2.8 - 30.0 mg/dL    Comment: Performed at [facility], [address] [place], [place] [address]  Acetaminophen level     Status: Abnormal   Collection Time: 11/15/17  5:05 PM  Result Value Ref Range   Acetaminophen (Tylenol), Serum <10 (L) 10 - 30 ug/mL    Comment:        THERAPEUTIC CONCENTRATIONS VARY SIGNIFICANTLY. A RANGE OF 10-30 ug/mL MAY BE AN EFFECTIVE CONCENTRATION FOR  MANY PATIENTS. HOWEVER, SOME ARE BEST TREATED AT CONCENTRATIONS OUTSIDE THIS RANGE. ACETAMINOPHEN CONCENTRATIONS >150 ug/mL AT 4 HOURS AFTER INGESTION AND >50 ug/mL AT 12 HOURS AFTER INGESTION ARE OFTEN ASSOCIATED WITH TOXIC REACTIONS. Performed at [facility], [address] [place], [place] [address]   cbc     Status: Abnormal   Collection Time: 11/15/17  5:05 PM  Result Value Ref Range   WBC 9.0 3.8 - 10.6 K/uL   RBC 4.98 4.40 - 5.90 MIL/uL   Hemoglobin 15.1 13.0 - 18.0 g/dL   HCT 43.1 40.0 - 52.0 %   MCV 86.6 80.0 - 100.0 fL   MCH 30.3 26.0 - 34.0 pg   MCHC 35.0 32.0 - 36.0 g/dL   RDW 15.0 (H) 11.5 - 14.5 %  Platelets 192 150 - 440 K/uL    Comment: Performed at [facility], [address] [place], [place] [address]  Urine Drug Screen, Qualitative     Status: Abnormal   Collection Time: 11/15/17  5:05 PM  Result Value Ref Range   Tricyclic, Ur Screen NONE DETECTED NONE DETECTED   Amphetamines, Ur Screen NONE DETECTED NONE DETECTED   MDMA (Ecstasy)Ur Screen NONE DETECTED NONE DETECTED   Cocaine Metabolite,Ur [place] POSITIVE (A) NONE DETECTED   Opiate, Ur Screen NONE DETECTED NONE DETECTED   Phencyclidine (PCP) Ur S NONE DETECTED NONE DETECTED   Cannabinoid 50 Ng, Ur [place] POSITIVE (A) NONE DETECTED   Barbiturates, Ur Screen NONE DETECTED NONE DETECTED   Benzodiazepine, Ur Scrn NONE DETECTED NONE DETECTED   Methadone Scn, Ur NONE DETECTED NONE DETECTED    Comment: (NOTE) Tricyclics + metabolites, urine    Cutoff 1000 ng/mL Amphetamines + metabolites, urine  Cutoff 1000 ng/mL MDMA (Ecstasy), urine              Cutoff 500 ng/mL Cocaine Metabolite, urine          Cutoff 300 ng/mL Opiate + metabolites, urine        Cutoff 300 ng/mL Phencyclidine (PCP), urine         Cutoff 25 ng/mL Cannabinoid, urine                 Cutoff 50 ng/mL Barbiturates + metabolites, urine  Cutoff 200 ng/mL Benzodiazepine, urine              Cutoff 200 ng/mL Methadone, urine                    Cutoff 300 ng/mL The urine drug screen provides only a preliminary, unconfirmed analytical test result and should not be used for non-medical purposes. Clinical consideration and professional judgment should be applied to any positive drug screen result due to possible interfering substances. A more specific alternate chemical method must be used in order to obtain a confirmed analytical result. Gas chromatography / mass spectrometry (GC/MS) is the preferred confirmat ory method. Performed at [facility], [address] [place], [place] [address]     No current facility-administered medications for this encounter.    Current Outpatient Medications  Medication Sig Dispense Refill  . paliperidone (INVEGA SUSTENNA) 234 MG/1.5ML SUSP injection Inject 1 Dose into the muscle every 30 (thirty) days.      Musculoskeletal: Strength & Muscle Tone: within normal limits Gait & Station: normal Patient leans: N/A  Psychiatric Specialty Exam: Physical Exam  Nursing note and vitals reviewed. Constitutional: He appears well-developed and well-nourished.  HENT:  Head: Normocephalic and atraumatic.  Eyes: Conjunctivae are normal. Pupils are equal, round, and reactive to light.  Neck: Normal range of motion.  Cardiovascular: Regular rhythm and normal heart sounds.  Respiratory: Effort normal. No respiratory distress.  GI: Soft.  Musculoskeletal: Normal range of motion.  Neurological: He is alert.  Skin: Skin is warm and dry.  Psychiatric: He has a normal mood and affect. His behavior is normal. Judgment and thought content normal.    Review of Systems  Constitutional: Negative.   HENT: Negative.   Eyes: Negative.   Respiratory: Negative.   Cardiovascular: Negative.   Gastrointestinal: Negative.   Musculoskeletal: Negative.   Skin: Negative.   Neurological: Negative.   Psychiatric/Behavioral: Negative.     Blood pressure 128/78, pulse 82, temperature 98.2  F (36.8 C), temperature source Oral, resp. rate 18,  height 6' (1.829 m), weight 106.6 kg (235 lb), SpO2 100 %.Body mass index is 31.87 kg/m.  General Appearance: Casual  Eye Contact:  Good  Speech:  Clear and Coherent  Volume:  Normal  Mood:  Euthymic  Affect:  Constricted  Thought Process:  Goal Directed  Orientation:  Full (Time, Place, and Person)  Thought Content:  Logical  Suicidal Thoughts:  No  Homicidal Thoughts:  No  Memory:  Immediate;   Fair Recent;   Fair Remote;   Fair  Judgement:  Fair  Insight:  Fair  Psychomotor Activity:  Normal  Concentration:  Concentration: Fair  Recall:  [employer] of Knowledge:  Fair  Language:  Fair  Akathisia:  No  Handed:  Right  AIMS (if indicated):     Assets:  Desire for Improvement  ADL's:  Intact  Cognition:  WNL  Sleep:        Treatment Plan Summary: Medication management and Plan Patient can be discharged from the emergency room.  He is to follow-up with [facility].  No change in treatment plan.  Discontinue IVC.  Case reviewed with emergency room physician.  Disposition: No evidence of imminent risk to self or others at present.   Patient does not meet criteria for psychiatric inpatient admission.  Alethia Berthold, MD 11/16/2017 8:13 PM

## 2017-11-16 NOTE — ED Notes (Signed)
IVC PAPERS  RESCINDED  PER  DR  CLAPACS  INFORMED  NURSE  WENDY([facility])

## 2018-03-31 ENCOUNTER — Other Ambulatory Visit: Payer: Self-pay

## 2018-03-31 ENCOUNTER — Encounter: Payer: Self-pay | Admitting: Emergency Medicine

## 2018-03-31 ENCOUNTER — Emergency Department: Payer: Medicaid Other

## 2018-03-31 ENCOUNTER — Emergency Department
Admission: EM | Admit: 2018-03-31 | Discharge: 2018-03-31 | Disposition: A | Discharge status: Home / Self Care | Payer: Medicaid Other | Attending: Emergency Medicine | Admitting: Emergency Medicine

## 2018-03-31 DIAGNOSIS — F17210 Nicotine dependence, cigarettes, uncomplicated: Secondary | ICD-10-CM | POA: Insufficient documentation

## 2018-03-31 DIAGNOSIS — J40 Bronchitis, not specified as acute or chronic: Secondary | ICD-10-CM | POA: Insufficient documentation

## 2018-03-31 DIAGNOSIS — R0981 Nasal congestion: Secondary | ICD-10-CM | POA: Diagnosis present

## 2018-03-31 MED ORDER — PREDNISONE 50 MG PO TABS: ORAL_TABLET | ORAL | 0 refills | Status: DC

## 2018-03-31 MED ORDER — AZITHROMYCIN 500 MG PO TABS
500.0000 mg | ORAL_TABLET | Freq: Once | ORAL | Status: AC
  Administered 2018-03-31: 500 mg via ORAL
  Filled 2018-03-31: qty 1

## 2018-03-31 MED ORDER — AZITHROMYCIN 250 MG PO TABS: ORAL_TABLET | ORAL | 0 refills | Status: DC

## 2018-03-31 MED ORDER — ALBUTEROL SULFATE HFA 108 (90 BASE) MCG/ACT IN AERS: 2.0000 | INHALATION_SPRAY | Freq: Four times a day (QID) | RESPIRATORY_TRACT | 0 refills | Status: DC | PRN

## 2018-03-31 MED ORDER — IPRATROPIUM-ALBUTEROL 0.5-2.5 (3) MG/3ML IN SOLN
3.0000 mL | Freq: Once | RESPIRATORY_TRACT | Status: AC
  Administered 2018-03-31: 3 mL via RESPIRATORY_TRACT
  Filled 2018-03-31: qty 3

## 2018-03-31 NOTE — ED Notes (Signed)
While taking VS, pt stated "Can you write me a note for my boss man?" Made pt aware that we do not write work notes in triage that he would have to speak to MD once in back for a work note.

## 2018-03-31 NOTE — ED Provider Notes (Signed)
[facility] Emergency Department Provider Note  ____________________________________________  Time seen: Approximately 3:33 PM  I have reviewed the triage vital signs and the nursing notes.   HISTORY  Chief Complaint URI    HPI Dominic [name] is a 42 y.o. male that presents to the emergency department for evaluation of nasal congestion, sore throat, productive cough, body aches for 1 week.  Patient has had chills and felt warm but has not checked his temperature.   He smokes a pack of cigarettes per day.  No shortness of breath, chest pain, nausea, vomiting.   Past Medical History:  Diagnosis Date  . Bipolar 1 disorder ([facility])   . Schizophrenia [facility])     Patient Active Problem List   Diagnosis Date Noted  . Schizophrenia ([facility]) 02/16/2016  . 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 Alcohol abuse 02/16/2016  . Cocaine abuse ([facility]) 02/16/2016    Past Surgical History:  Procedure Laterality Date  . CYST REMOVAL HAND     neck    Prior to Admission medications   Medication Sig Start Date End Date Taking? Authorizing Provider  albuterol (PROVENTIL HFA;VENTOLIN HFA) 108 (90 Base) MCG/ACT inhaler Inhale 2 puffs into the lungs every 6 (six) hours as needed for wheezing or shortness of breath. 03/31/18   Enid Derry, PA-C  azithromycin (ZITHROMAX Z-PAK) 250 MG tablet Take 2 tablets (500 mg) on  Day 1,  followed by 1 tablet (250 mg) once daily on Days 2 through 5. 03/31/18   Enid Derry, PA-C  predniSONE (DELTASONE) 50 MG tablet Take 1 pill per day 03/31/18   Enid Derry, PA-C    Allergies Patient has no known allergies.  No family history on file.  Social History Social History   Tobacco Use  . Smoking status: Current Every Day Smoker    Packs/day: 0.50    Types: Cigarettes  . Smokeless tobacco: Never Used  Substance Use Topics  . Alcohol use: Yes    Alcohol/week: 4.2 oz    Types: 7 Shots of liquor per week  . Drug use: Yes    Types: Cocaine,  Marijuana     Review of Systems  Constitutional: Positive for chills. Eyes: No visual changes. No discharge. ENT: Positive for congestion and rhinorrhea. Cardiovascular: No chest pain. Respiratory: Positive for cough. No SOB. Gastrointestinal: No abdominal pain.  No nausea, no vomiting.   Musculoskeletal: Positive for body aches.  Skin: Negative for rash, abrasions, lacerations, ecchymosis.   ____________________________________________   PHYSICAL EXAM:  VITAL SIGNS: ED Triage Vitals  Enc Vitals Group     BP 03/31/18 1413 (!) 104/56     Pulse Rate 03/31/18 1411 82     Resp 03/31/18 1411 20     Temp 03/31/18 1411 99.6 F (37.6 C)     Temp Source 03/31/18 1411 Oral     SpO2 03/31/18 1411 96 %     Weight 03/31/18 1410 210 lb (95.3 kg)     Height 03/31/18 1410  (1.778 m)     Head Circumference --      Peak Flow --      Pain Score 03/31/18 1414 8     Pain Loc --      Pain Edu? --      Excl. in GC? --      Constitutional: Alert and oriented. Well appearing and in no acute distress. Eyes: Conjunctivae are normal. PERRL. EOMI. No discharge. Head: Atraumatic. ENT: No frontal and maxillary sinus tenderness.  Ears: Tympanic membranes pearly gray with good landmarks. No discharge.      Nose: Mild congestion/rhinnorhea.      Mouth/Throat: Mucous membranes are moist. Oropharynx non-erythematous. Tonsils not enlarged. No exudates. Uvula midline. Neck: No stridor.   Hematological/Lymphatic/Immunilogical: No cervical lymphadenopathy. Cardiovascular: Normal rate, regular rhythm.  Good peripheral circulation. Respiratory: Normal respiratory effort without tachypnea or retractions. Lungs CTAB. Good air entry to the bases with no decreased or absent breath sounds. Gastrointestinal: Bowel sounds 4 quadrants. Soft and nontender to palpation. No guarding or rigidity. No palpable masses. No distention. Musculoskeletal: Full range of motion to all extremities. No gross  deformities appreciated. Neurologic:  Normal speech and language. No gross focal neurologic deficits are appreciated.  Skin:  Skin is warm, dry and intact. No rash noted. Psychiatric: Mood and affect are normal. Speech and behavior are normal. Patient exhibits appropriate insight and judgement.   ____________________________________________   LABS (all labs ordered are listed, but only abnormal results are displayed)  Labs Reviewed - No data to display ____________________________________________  EKG   ____________________________________________  RADIOLOGY Lexine Baton, personally viewed and evaluated these images (plain radiographs) as part of my medical decision making, as well as reviewing the written report by the radiologist.  Dg Chest 1 View  Result Date: 03/31/2018 CLINICAL DATA:  Follow-up radiograph for suspected nodule, with nipple marker. EXAM: CHEST  1 VIEW COMPARISON:  Chest radiograph Mar 31, 2018 at 1458 hours FINDINGS: Nipple markers in place. No suspicious parenchymal nodules. Similar bronchitic changes without pleural effusion or focal consolidation. No pneumothorax. Cardiomediastinal silhouette is normal. Included soft tissue planes and osseous structures are nonsuspicious. IMPRESSION: Mild bronchitic changes without focal consolidation. Nodule on prior radiograph corresponds to a nipple marker. Electronically Signed   By: Awilda Metro M.D.   On: 03/31/2018 16:07   Dg Chest 2 View  Result Date: 03/31/2018 CLINICAL DATA:  Cold for the past week with fever and cough. EXAM: CHEST - 2 VIEW COMPARISON:  Chest x-ray dated May 30, 2015. FINDINGS: The heart remains at the upper limits of normal in size. Normal mediastinal contours. Normal pulmonary vascularity. Mild bronchitic changes. Nodular density projecting over the left lower lung on the frontal view, not clearly seen on the lateral view. No acute osseous abnormality. IMPRESSION: 1. Bronchitic changes. 2.  Nodular density projecting over the left lower lung on the frontal view, not clearly seen on the lateral view, may represent a nipple shadow. Consider repeat PA chest x-ray with nipple markers. Electronically Signed   By: Obie Dredge M.D.   On: 03/31/2018 15:19    ____________________________________________    PROCEDURES  Procedure(s) performed:    Procedures    Medications  ipratropium-albuterol (DUONEB) 0.5-2.5 (3) MG/3ML nebulizer solution 3 mL (3 mLs Nebulization Given 03/31/18 1624)  azithromycin (ZITHROMAX) tablet 500 mg (500 mg Oral Given 03/31/18 1623)     ____________________________________________   INITIAL IMPRESSION / ASSESSMENT AND PLAN / ED COURSE  Pertinent labs & imaging results that were available during my care of the patient were reviewed by me and considered in my medical decision making (see chart for details).  Review of the [place] CSRS was performed in accordance of the [facility] prior to dispensing any controlled drugs.   Patient's diagnosis is consistent with bronchitis. Vital signs and exam are reassuring. Chest xray consistent with bronchitis. Patient is in the room eating and drinking soda. Patient feels comfortable going home. Patient will be discharged home with prescriptions for azithromycin, prednisone, albuterol inhaler. Patient  is to follow up with PCP as needed or otherwise directed. Patient is given ED precautions to return to the ED for any worsening or new symptoms.     ____________________________________________  FINAL CLINICAL IMPRESSION(S) / ED DIAGNOSES  Final diagnoses:  Bronchitis      NEW MEDICATIONS STARTED DURING THIS VISIT:  ED Discharge Orders        Ordered    azithromycin (ZITHROMAX Z-PAK) 250 MG tablet     03/31/18 1634    predniSONE (DELTASONE) 50 MG tablet     03/31/18 1634    albuterol (PROVENTIL HFA;VENTOLIN HFA) 108 (90 Base) MCG/ACT inhaler  Every 6 hours PRN     03/31/18 1634          This chart was  dictated using voice recognition software/Dragon. Despite best efforts to proofread, errors can occur which can change the meaning. Any change was purely unintentional.    Enid Derry, PA-C 03/31/18 1648    Sharman Cheek, MD 04/09/18 [phone]

## 2018-03-31 NOTE — ED Triage Notes (Signed)
Pt in via POV with complaints of a "cold" x one week.  Pt reports fever, cough, congestion.  Vitals WDL, NAD noted at this time.

## 2018-04-08 ENCOUNTER — Encounter: Payer: Self-pay | Admitting: Emergency Medicine

## 2018-04-08 ENCOUNTER — Other Ambulatory Visit: Payer: Self-pay

## 2018-04-08 ENCOUNTER — Emergency Department
Admission: EM | Admit: 2018-04-08 | Discharge: 2018-04-09 | Disposition: A | Discharge status: Other Acute Inpatient Hospital | Payer: Medicaid Other | Attending: Emergency Medicine | Admitting: Emergency Medicine

## 2018-04-08 DIAGNOSIS — R45851 Suicidal ideations: Secondary | ICD-10-CM | POA: Diagnosis not present

## 2018-04-08 DIAGNOSIS — F17210 Nicotine dependence, cigarettes, uncomplicated: Secondary | ICD-10-CM | POA: Diagnosis not present

## 2018-04-08 DIAGNOSIS — F1010 Alcohol abuse, uncomplicated: Secondary | ICD-10-CM | POA: Diagnosis present

## 2018-04-08 DIAGNOSIS — F29 Unspecified psychosis not due to a substance or known physiological condition: Secondary | ICD-10-CM | POA: Diagnosis present

## 2018-04-08 DIAGNOSIS — Z79899 Other long term (current) drug therapy: Secondary | ICD-10-CM | POA: Diagnosis not present

## 2018-04-08 DIAGNOSIS — F1410 Cocaine abuse, uncomplicated: Secondary | ICD-10-CM | POA: Diagnosis not present

## 2018-04-08 DIAGNOSIS — F209 Schizophrenia, unspecified: Secondary | ICD-10-CM | POA: Diagnosis not present

## 2018-04-08 DIAGNOSIS — F319 Bipolar disorder, unspecified: Secondary | ICD-10-CM | POA: Diagnosis not present

## 2018-04-08 DIAGNOSIS — Z008 Encounter for other general examination: Secondary | ICD-10-CM | POA: Diagnosis present

## 2018-04-08 DIAGNOSIS — R443 Hallucinations, unspecified: Secondary | ICD-10-CM

## 2018-04-08 DIAGNOSIS — F203 Undifferentiated schizophrenia: Secondary | ICD-10-CM | POA: Diagnosis not present

## 2018-04-08 LAB — URINALYSIS, COMPLETE (UACMP) WITH MICROSCOPIC
BACTERIA UA: NONE SEEN
BILIRUBIN URINE: NEGATIVE
Glucose, UA: NEGATIVE mg/dL
Hgb urine dipstick: NEGATIVE
Ketones, ur: 5 mg/dL — AB
Leukocytes, UA: NEGATIVE
Nitrite: NEGATIVE
Protein, ur: NEGATIVE mg/dL
SPECIFIC GRAVITY, URINE: 1.029 (ref 1.005–1.030)
pH: 5 (ref 5.0–8.0)

## 2018-04-08 LAB — CBC WITH DIFFERENTIAL/PLATELET
Basophils Absolute: 0 10*3/uL (ref 0–0.1)
Basophils Relative: 0 %
Eosinophils Absolute: 0.1 10*3/uL (ref 0–0.7)
Eosinophils Relative: 1 %
HCT: 37.9 % — ABNORMAL LOW (ref 40.0–52.0)
Hemoglobin: 13.5 g/dL (ref 13.0–18.0)
LYMPHS ABS: 4.4 10*3/uL — AB (ref 1.0–3.6)
LYMPHS PCT: 36 %
MCH: 31.1 pg (ref 26.0–34.0)
MCHC: 35.6 g/dL (ref 32.0–36.0)
MCV: 87.3 fL (ref 80.0–100.0)
MONO ABS: 0.8 10*3/uL (ref 0.2–1.0)
Monocytes Relative: 6 %
NEUTROS ABS: 6.8 10*3/uL — AB (ref 1.4–6.5)
Neutrophils Relative %: 57 %
Platelets: 316 10*3/uL (ref 150–440)
RBC: 4.34 MIL/uL — ABNORMAL LOW (ref 4.40–5.90)
RDW: 15.1 % — AB (ref 11.5–14.5)
WBC: 12.1 10*3/uL — ABNORMAL HIGH (ref 3.8–10.6)

## 2018-04-08 LAB — COMPREHENSIVE METABOLIC PANEL
ALBUMIN: 3.3 g/dL — AB (ref 3.5–5.0)
ALT: 32 U/L (ref 17–63)
AST: 36 U/L (ref 15–41)
Alkaline Phosphatase: 31 U/L — ABNORMAL LOW (ref 38–126)
Anion gap: 7 (ref 5–15)
BUN: 12 mg/dL (ref 6–20)
CHLORIDE: 108 mmol/L (ref 101–111)
CO2: 23 mmol/L (ref 22–32)
CREATININE: 0.84 mg/dL (ref 0.61–1.24)
Calcium: 8.5 mg/dL — ABNORMAL LOW (ref 8.9–10.3)
GFR calc non Af Amer: >60 mL/min (ref >60)
GLUCOSE: 103 mg/dL — AB (ref 65–99)
Potassium: 3.5 mmol/L (ref 3.5–5.1)
SODIUM: 138 mmol/L (ref 135–145)
Total Bilirubin: 0.4 mg/dL (ref 0.3–1.2)
Total Protein: 7.2 g/dL (ref 6.5–8.1)

## 2018-04-08 LAB — URINE DRUG SCREEN, QUALITATIVE (ARMC ONLY)
Amphetamines, Ur Screen: NOT DETECTED
BARBITURATES, UR SCREEN: NOT DETECTED
Benzodiazepine, Ur Scrn: NOT DETECTED
COCAINE METABOLITE, UR ~~LOC~~: POSITIVE — AB
Cannabinoid 50 Ng, Ur ~~LOC~~: POSITIVE — AB
MDMA (Ecstasy)Ur Screen: NOT DETECTED
METHADONE SCREEN, URINE: NOT DETECTED
Opiate, Ur Screen: NOT DETECTED
Phencyclidine (PCP) Ur S: NOT DETECTED
TRICYCLIC, UR SCREEN: NOT DETECTED

## 2018-04-08 LAB — ETHANOL: Alcohol, Ethyl (B): <10 mg/dL (ref <10)

## 2018-04-08 LAB — SALICYLATE LEVEL

## 2018-04-08 LAB — ACETAMINOPHEN LEVEL

## 2018-04-08 NOTE — ED Notes (Signed)
Pt. Transferred to [facility] from ED to room 3 after screening for contraband. Report to include Situation, Background, Assessment and Recommendations from Kim Summers RN. Pt. Oriented to unit including Q15 minute rounds as well as the security cameras for their protection. Patient is alert and oriented, warm and dry in no acute distress. Pt. Encouraged to let me know if needs arise. 

## 2018-04-08 NOTE — ED Provider Notes (Addendum)
[facility] Emergency Department Provider Note   ____________________________________________   First MD Initiated Contact with Patient 04/08/18 1954     (approximate)  I have reviewed the triage vital signs and the nursing notes.   HISTORY  Chief Complaint Alcohol Problem  chief complaint is hearing voices  HPI Dominic [name] is a 42 y.o. male he reports he usually gets his medications at [facility]. He went there on the 22nd but they have forgotten to order his medications. His post come back there Wednesday but he is hearing voices that are telling him to kill himself and do other things he doesn't want to do and he doesn't think he can stand it any longer so he came in here tonight. He does not remember the name of his medication. He said he used to take an Dominic [name] but is not taking that anymore something with a "weird name" she has a history of schizophrenia. Patient says he's been drinking and doing drugs and effort to suppress the voices. He did come in with a male who is also hearing voices.   Past Medical History:  Diagnosis Date  . Bipolar 1 disorder (HCC)   . Schizophrenia [facility])     Patient Active Problem List   Diagnosis Date Noted  . Schizophrenia (HCC) 02/16/2016  . 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 Alcohol abuse 02/16/2016  . Cocaine abuse (HCC) 02/16/2016    Past Surgical History:  Procedure Laterality Date  . CYST REMOVAL HAND     neck    Prior to Admission medications   Medication Sig Start Date End Date Taking? Authorizing Provider  albuterol (PROVENTIL HFA;VENTOLIN HFA) 108 (90 Base) MCG/ACT inhaler Inhale 2 puffs into the lungs every 6 (six) hours as needed for wheezing or shortness of breath. 03/31/18   Enid Derry, PA-C  azithromycin (ZITHROMAX Z-PAK) 250 MG tablet Take 2 tablets (500 mg) on  Day 1,  followed by 1 tablet (250 mg) once daily on Days 2 through 5. 03/31/18   Enid Derry, PA-C  predniSONE (DELTASONE) 50 MG  tablet Take 1 pill per day 03/31/18   Enid Derry, PA-C    Allergies Patient has no known allergies.  History reviewed. No pertinent family history.  Social History Social History   Tobacco Use  . Smoking status: Current Every Day Smoker    Packs/day: 1.00    Types: Cigarettes  . Smokeless tobacco: Never Used  Substance Use Topics  . Alcohol use: Yes    Alcohol/week: 4.2 oz    Types: 7 Shots of liquor per week    Comment: Pt states he drinks beer however states he is not sure how much he consumes  . Drug use: Yes    Types: Cocaine, Marijuana    Review of Systems  Constitutional: No fever/chills Eyes: No visual changes. ENT: No sore throat. Cardiovascular: Denies chest pain. Respiratory: Denies shortness of breath. Gastrointestinal: No abdominal pain.  No nausea, no vomiting.  No diarrhea.  No constipation. Genitourinary: Negative for dysuria. Musculoskeletal: Negative for back pain. Skin: Negative for rash. Neurological: Negative for headaches, focal weakness atic: Allergic/Immunilogical: **}  ____________________________________________   PHYSICAL EXAM:  VITAL SIGNS: ED Triage Vitals  Enc Vitals Group     BP      Pulse      Resp      Temp      Temp src      SpO2      Weight      Height  Head Circumference      Peak Flow      Pain Score      Pain Loc      Pain Edu?      Excl. in GC?     Constitutional: Alert and oriented. Well appearing and in no acute distress. Eyes: Conjunctivae are normal.  Head: Atraumatic. Nose: No congestion/rhinnorhea. Mouth/Throat: Mucous membranes are moist.  Oropharynx non-erythematous. Neck: No stridor. Cardiovascular: Normal rate, regular rhythm. Grossly normal heart sounds.  Good peripheral circulation. Respiratory: Normal respiratory effort.  No retractions. Lungs CTAB. Gastrointestinal: Soft and nontender. No distention. No abdominal bruits. No CVA tenderness. Musculoskeletal: No lower extremity tenderness  nor edema.  No joint effusions. Neurologic:  Normal speech and language. No gross focal neurologic deficits are appreciated. No gait instability. Skin:  Skin is warm, dry and intact. No rash noted. Psychiatric: Mood and affect are normal. Speech and behavior are normal.  ____________________________________________   LABS (all labs ordered are listed, but only abnormal results are displayed)  Labs Reviewed  ACETAMINOPHEN LEVEL  COMPREHENSIVE METABOLIC PANEL  ETHANOL  SALICYLATE LEVEL  CBC WITH DIFFERENTIAL/PLATELET  URINALYSIS, COMPLETE (UACMP) WITH MICROSCOPIC  URINE DRUG SCREEN, QUALITATIVE ([facility] ONLY)   ____________________________________________  EKG   ____________________________________________  RADIOLOGY  ED MD interpretation:    Official radiology report(s): No results found.  ____________________________________________   PROCEDURES  Procedure(s) performed:   Procedures  Critical Care performed:  ____________________________________________   INITIAL IMPRESSION / ASSESSMENT AND PLAN / ED COURSE Dominic [name] reports the patient received the injection below on the 22nd: [place]  at a behavior [facility] also says if he's been using drugs like cocaine it will cause hallucinations to per [place].        ____________________________________________   FINAL CLINICAL IMPRESSION(S) / ED DIAGNOSES  Final diagnoses:  Hallucinations     ED Discharge Orders    None       Note:  This document was prepared using Dragon voice recognition software and may include unintentional dictation errors.    Arnaldo Natal, MD 04/08/18 2009    Arnaldo Natal, MD 04/08/18 2202

## 2018-04-08 NOTE — ED Notes (Signed)
Pt. Transferred to BHU from ED to room 3 after screening for contraband. Report to include Situation, Background, Assessment and Recommendations from Vanessa Evendale RN. Pt. Oriented to unit including Q15 minute rounds as well as the security cameras for their protection. Patient is alert and oriented, warm and dry in no acute distress. Pt. Encouraged to let me know if needs arise.

## 2018-04-08 NOTE — BH Assessment (Signed)
Assessment Note  Dominic [name] is an 42 y.o. male. Dominic [name] arrived to the ED by way of EMS. He reports that he is hearing voices, abusing alcohol and drugs.  He states the voices are telling him to harm himself.  He states he had a plan to run in the highways hoping a car hit him multiple times, before he decided to come to the hospital. He states that he thought about his kids and he did not want to die.  He reports that he has been having increased depression over the past couple of days. He states that he has been stressed because DSS is trying to take his kids which has been stressing him more and leading him to drink and use drugs more.  He denied symptoms of anxiety.  He denied having visual hallucinations.  He continues to have suicidal ideation with no intent.  He denied homicidal ideation or intent.  He is currently being seen at [facility].  He states that he was to get his medication at his last visit, but that it was not there when he arrived.  He states that he has been getting injections for a long time.   Diagnosis: Schizophrenia, Substance Misuse  Past Medical History:  Past Medical History:  Diagnosis Date  . Bipolar 1 disorder (HCC)   . Schizophrenia [facility])     Past Surgical History:  Procedure Laterality Date  . CYST REMOVAL HAND     neck    Family History: History reviewed. No pertinent family history.  Social History:  reports that he has been smoking cigarettes.  He has been smoking about 1.00 pack per day. He has never used smokeless tobacco. He reports that he drinks about 4.2 oz of alcohol per week. He reports that he has current or past drug history. Drugs: Cocaine and Marijuana.  Additional Social History:  Alcohol / Drug Use History of alcohol / drug use?: Yes Substance #1 Name of Substance 1: Alcohol 1 - Age of First Use: 16 1 - Amount (size/oz): Unsure - a lot 1 - Frequency: daily 1 - Last Use / Amount: 04/08/2018 Substance #2 Name of  Substance 2: Crack Cocaine 2 - Age of First Use: 30s 2 - Amount (size/oz): a lot 2 - Frequency: 4 days a week 2 - Last Use / Amount: 04/08/2018  CIWA:   COWS:    Allergies: No Known Allergies  Home Medications:  (Not in a hospital admission)  OB/GYN Status:  No LMP for male patient.  General Assessment Data Location of Assessment: [facility] ED TTS Assessment: In system Is this a Tele or Face-to-Face Assessment?: Face-to-Face Is this an Initial Assessment or a Re-assessment for this encounter?: Initial Assessment Marital status: Long term relationship Maiden name: None Is patient pregnant?: No Pregnancy Status: No Living Arrangements: Spouse/significant other(fiance) Can pt return to current living arrangement?: Yes Admission Status: Voluntary Is patient capable of signing voluntary admission?: Yes Referral Source: Self/Family/Friend Insurance type: Medicaid  Medical Screening Exam [facility] Walk-in ONLY) Medical Exam completed: Yes  Crisis Care Plan Living Arrangements: Spouse/significant other(fiance) Legal Guardian: Other:(Self) Name of Psychiatrist: [facility] Name of Therapist: [facility]  Education Status Is patient currently in school?: No Is the patient employed, unemployed or receiving disability?: Unemployed  Risk to self with the past 6 months Suicidal Ideation: Yes-Currently Present Has patient been a risk to self within the past 6 months prior to admission? : No Suicidal Intent: No-Not Currently/Within Last 6 Months  Has patient had any suicidal intent within the past 6 months prior to admission? : Yes Is patient at risk for suicide?: No Suicidal Plan?: Yes-Currently Present Has patient had any suicidal plan within the past 6 months prior to admission? : Yes Specify Current Suicidal Plan: Walk in traffic to get hit by car Access to Means: Yes What has been your use of drugs/alcohol within the last 12 months?: use of alcohol and  cocaine Previous Attempts/Gestures: No How many times?: 0 Other Self Harm Risks: denied Triggers for Past Attempts: Unknown Intentional Self Injurious Behavior: None Family Suicide History: No Recent stressful life event(s): Legal Issues(DSS involvement) Persecutory voices/beliefs?: Yes Depression: Yes Depression Symptoms: Feeling worthless/self pity Substance abuse history and/or treatment for substance abuse?: Yes Suicide prevention information given to non-admitted patients: Not applicable  Risk to Others within the past 6 months Homicidal Ideation: No Does patient have any lifetime risk of violence toward others beyond the six months prior to admission? : No Thoughts of Harm to Others: No Current Homicidal Intent: No Current Homicidal Plan: No Access to Homicidal Means: No Identified Victim: None identified History of harm to others?: No Assessment of Violence: None Noted Violent Behavior Description: denied Does patient have access to weapons?: No Criminal Charges Pending?: Yes Describe Pending Criminal Charges: Misdomeanor assault on a male Does patient have a court date: Yes Court Date: 05/07/18 Is patient on probation?: No  Psychosis Hallucinations: Auditory Delusions: None noted  Mental Status Report Appearance/Hygiene: In scrubs Eye Contact: Fair Motor Activity: Unremarkable Speech: Logical/coherent Level of Consciousness: Alert Mood: Suspicious Affect: Appropriate to circumstance Anxiety Level: None Thought Processes: Coherent Judgement: Partial Orientation: Appropriate for developmental age Obsessive Compulsive Thoughts/Behaviors: None  Cognitive Functioning Concentration: Good Memory: Recent Intact Is patient IDD: No Is patient DD?: No Insight: Fair Impulse Control: Poor Appetite: Poor Have you had any weight changes? : No Change Sleep: Decreased Vegetative Symptoms: None  ADLScreening [facility] Assessment Services) Patient's cognitive ability  adequate to safely complete daily activities?: Yes Patient able to express need for assistance with ADLs?: Yes Independently performs ADLs?: Yes (appropriate for developmental age)  Prior Inpatient Therapy Prior Inpatient Therapy: Yes Prior Therapy Dates: 2019 and prior Prior Therapy Facilty/Provider(s): [facility] Reason for Treatment: schizophrenia  Prior Outpatient Therapy Prior Outpatient Therapy: Yes Prior Therapy Dates: Current Prior Therapy Facilty/Provider(s): [facility] Reason for Treatment: Schizophreania Does patient have an ACCT team?: No Does patient have Intensive In-House Services?  : No Does patient have [facility] services? : No Does patient have P4CC services?: No  ADL Screening (condition at time of admission) Patient's cognitive ability adequate to safely complete daily activities?: Yes Is the patient deaf or have difficulty hearing?: No Does the patient have difficulty seeing, even when wearing glasses/contacts?: No Does the patient have difficulty concentrating, remembering, or making decisions?: Yes Patient able to express need for assistance with ADLs?: Yes Does the patient have difficulty dressing or bathing?: No Independently performs ADLs?: Yes (appropriate for developmental age) Does the patient have difficulty walking or climbing stairs?: No Weakness of Legs: None Weakness of Arms/Hands: None  Home Assistive Devices/Equipment Home Assistive Devices/Equipment: None    Abuse/Neglect Assessment (Assessment to be complete while patient is alone) Abuse/Neglect Assessment Can Be Completed: (Patient denied a history of abuse)     Advance Directives (For Healthcare) Does Patient Have a Medical Advance Directive?: No Would patient like information on creating a medical advance directive?: No - Patient declined  Disposition:  Disposition Initial Assessment Completed for this Encounter: Yes  On Site Evaluation by:   Reviewed with  Physician:    Justice Deeds 04/08/2018 10:29 PM

## 2018-04-09 ENCOUNTER — Other Ambulatory Visit: Payer: Self-pay

## 2018-04-09 ENCOUNTER — Encounter: Payer: Self-pay | Admitting: Behavioral Health

## 2018-04-09 ENCOUNTER — Inpatient Hospital Stay
Admission: AD | Admit: 2018-04-09 | Discharge: 2018-04-12 | DRG: 885 | Disposition: A | Discharge status: Home / Self Care | Payer: Medicaid Other | Attending: Psychiatry | Admitting: Psychiatry

## 2018-04-09 DIAGNOSIS — R45851 Suicidal ideations: Secondary | ICD-10-CM | POA: Diagnosis present

## 2018-04-09 DIAGNOSIS — F209 Schizophrenia, unspecified: Principal | ICD-10-CM | POA: Diagnosis present

## 2018-04-09 DIAGNOSIS — F1490 Cocaine use, unspecified, uncomplicated: Secondary | ICD-10-CM | POA: Diagnosis present

## 2018-04-09 DIAGNOSIS — F1410 Cocaine abuse, uncomplicated: Secondary | ICD-10-CM | POA: Diagnosis present

## 2018-04-09 DIAGNOSIS — F1010 Alcohol abuse, uncomplicated: Secondary | ICD-10-CM | POA: Diagnosis present

## 2018-04-09 DIAGNOSIS — F203 Undifferentiated schizophrenia: Secondary | ICD-10-CM | POA: Diagnosis not present

## 2018-04-09 DIAGNOSIS — F29 Unspecified psychosis not due to a substance or known physiological condition: Secondary | ICD-10-CM | POA: Diagnosis present

## 2018-04-09 MED ORDER — ALUM & MAG HYDROXIDE-SIMETH 200-200-20 MG/5ML PO SUSP: 30.0000 mL | ORAL | Status: DC | PRN

## 2018-04-09 MED ORDER — TRAZODONE HCL 100 MG PO TABS
100.0000 mg | ORAL_TABLET | Freq: Every evening | ORAL | Status: DC | PRN
  Administered 2018-04-09 – 2018-04-10 (×2): 100 mg via ORAL
  Filled 2018-04-09 (×2): qty 1

## 2018-04-09 MED ORDER — CHLORDIAZEPOXIDE HCL 25 MG PO CAPS
25.0000 mg | ORAL_CAPSULE | Freq: Three times a day (TID) | ORAL | Status: DC
  Administered 2018-04-09: 25 mg via ORAL
  Filled 2018-04-09: qty 1

## 2018-04-09 MED ORDER — CHLORDIAZEPOXIDE HCL 25 MG PO CAPS
25.0000 mg | ORAL_CAPSULE | Freq: Three times a day (TID) | ORAL | Status: AC
  Administered 2018-04-09 – 2018-04-10 (×4): 25 mg via ORAL
  Filled 2018-04-09 (×4): qty 1

## 2018-04-09 MED ORDER — ACETAMINOPHEN 325 MG PO TABS
650.0000 mg | ORAL_TABLET | Freq: Four times a day (QID) | ORAL | Status: DC | PRN
  Administered 2018-04-10: 650 mg via ORAL
  Filled 2018-04-09: qty 2

## 2018-04-09 MED ORDER — ALBUTEROL SULFATE HFA 108 (90 BASE) MCG/ACT IN AERS
2.0000 | INHALATION_SPRAY | Freq: Four times a day (QID) | RESPIRATORY_TRACT | Status: DC | PRN
  Filled 2018-04-09: qty 6.7

## 2018-04-09 MED ORDER — PALIPERIDONE ER 3 MG PO TB24: 6.0000 mg | ORAL_TABLET | Freq: Every day | ORAL | Status: DC

## 2018-04-09 MED ORDER — HYDROXYZINE HCL 50 MG PO TABS
50.0000 mg | ORAL_TABLET | Freq: Three times a day (TID) | ORAL | Status: DC | PRN
  Administered 2018-04-09 – 2018-04-11 (×3): 50 mg via ORAL
  Filled 2018-04-09 (×3): qty 1

## 2018-04-09 MED ORDER — MAGNESIUM HYDROXIDE 400 MG/5ML PO SUSP
30.0000 mL | Freq: Every day | ORAL | Status: DC | PRN
Start: 2018-04-09 — End: 2018-04-12

## 2018-04-09 MED ORDER — PALIPERIDONE ER 3 MG PO TB24
6.0000 mg | ORAL_TABLET | Freq: Every day | ORAL | Status: DC
  Filled 2018-04-09: qty 2

## 2018-04-09 NOTE — BH Assessment (Signed)
Per Dr. Clapac's patient meets criteria for inpatient psychiatric hospitalization. Pending bed assignment on BMU.  

## 2018-04-09 NOTE — Consult Note (Signed)
[name]   Reason for Consult: Consult for 42 year old man who came to the emergency room reporting mood symptoms hallucinations and substance abuse Referring Physician: Quentin [name] Patient Identification: Dominic [name] MRN:  [id number] Principal Diagnosis: Schizophrenia [facility]) Diagnosis:   Patient Active Problem List   Diagnosis Date Noted  . Schizophrenia ([place]) [F20.9] 02/16/2016  . 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 Alcohol abuse [F10.10] 02/16/2016  . Cocaine abuse ([place]) [F14.10] 02/16/2016    Total Time spent with patient: 1 hour  Subjective:   Dominic [name] is a 42 y.o. male patient admitted with "I am hearing voices on drugs".  HPI: Patient interviewed chart reviewed.  42 year old man presented to the emergency room saying that he has been hearing voices.  They are on and off but when they are happening they are disturbing and they are becoming more frequently.  Most recently he was hearing them yesterday.  Patient's mood has been irritable and depressed.  Sleep has been poor for at least a week.  Not eating well during that time as well.  Patient says that he has not taken any psychiatric medicines in at least a month.  He cannot recall what medicines they were.  He last however says he was being followed up at [facility].  Patient says that he is using crack cocaine every day and has been for weeks now.  This is an escalation over his previous baseline use.  Also using alcohol regularly.  He claims about 8 of the 40 ounce beers a day which sounds like an exaggeration but nonetheless it sounds like he has been drinking heavily.  Patient endorses having vague suicidal thoughts with thoughts of running out into traffic and in fact even claims he tried to do it yesterday but somebody stopped him.  Medical history: No known medical problems outside of his mental health issues that I am aware of  Social history: He says that he lives with his fianc.  Does not  work.  Seems to have little activity during the day.  Substance abuse history: Repeated episodes of detox and treatment for alcohol and cocaine abuse.  Minimal experience with sustained sobriety.  Patient denies ever having had a seizure or anything that sounds like full delirium tremens from alcohol withdrawal but says that he gets "shaky"  Past Psychiatric History: Patient has presented to the hospital with psychotic symptoms in the past and has been thought to probably have an independent diagnosis of schizophrenia in addition to the substance abuse problem.  Our last contact with him he was being treated with a long-acting shot of Invega.  He has had past hospitalizations and has had past suicide attempts.  Risk to Self: Suicidal Ideation: Yes-Currently Present Suicidal Intent: No-Not Currently/Within Last 6 Months Is patient at risk for suicide?: No Suicidal Plan?: Yes-Currently Present Specify Current Suicidal Plan: Walk in traffic to get hit by car Access to Means: Yes What has been your use of drugs/alcohol within the last 12 months?: use of alcohol and cocaine How many times?: 0 Other Self Harm Risks: denied Triggers for Past Attempts: Unknown Intentional Self Injurious Behavior: None Risk to Others: Homicidal Ideation: No Thoughts of Harm to Others: No Current Homicidal Intent: No Current Homicidal Plan: No Access to Homicidal Means: No Identified Victim: None identified History of harm to others?: No Assessment of Violence: None Noted Violent Behavior Description: denied Does patient have access to weapons?: No Criminal Charges Pending?: Yes Describe Pending Criminal Charges: Misdomeanor assault on  a male Does patient have a court date: Yes Court Date: 05/07/18 Prior Inpatient Therapy: Prior Inpatient Therapy: Yes Prior Therapy Dates: 2019 and prior Prior Therapy Facilty/Provider(s): [facility] Reason for Treatment: schizophrenia Prior Outpatient Therapy: Prior Outpatient  Therapy: Yes Prior Therapy Dates: Current Prior Therapy Facilty/Provider(s): [facility] Reason for Treatment: Schizophreania Does patient have an ACCT team?: No Does patient have Intensive In-House Services?  : No Does patient have [facility] services? : No Does patient have P4CC services?: No  Past Medical History:  Past Medical History:  Diagnosis Date  . Bipolar 1 disorder ([place])   . Schizophrenia [facility])     Past Surgical History:  Procedure Laterality Date  . CYST REMOVAL HAND     neck   Family History: History reviewed. No pertinent family history. Family Psychiatric  History: No known family history Social History:  Social History   Substance and Sexual Activity  Alcohol Use Yes  . Alcohol/week: 4.2 oz  . Types: 7 Shots of liquor per week   Comment: Pt states he drinks beer however states he is not sure how much he consumes     Social History   Substance and Sexual Activity  Drug Use Yes  . Types: Cocaine, Marijuana    Social History   Socioeconomic History  . Marital status: Single    Spouse name: Not on file  . Number of children: Not on file  . Years of education: Not on file  . Highest education level: Not on file  Occupational History  . Not on file  Social Needs  . Financial resource strain: Not on file  . Food insecurity:    Worry: Not on file    Inability: Not on file  . Transportation needs:    Medical: Not on file    Non-medical: Not on file  Tobacco Use  . Smoking status: Current Every Day Smoker    Packs/day: 1.00    Types: Cigarettes  . Smokeless tobacco: Never Used  Substance and Sexual Activity  . Alcohol use: Yes    Alcohol/week: 4.2 oz    Types: 7 Shots of liquor per week    Comment: Pt states he drinks beer however states he is not sure how much he consumes  . Drug use: Yes    Types: Cocaine, Marijuana  . Sexual activity: Not on file  Lifestyle  . Physical activity:    Days per week: Not on file    Minutes per  session: Not on file  . Stress: Not on file  Relationships  . Social connections:    Talks on phone: Not on file    Gets together: Not on file    Attends religious service: Not on file    Active member of club or organization: Not on file    Attends meetings of clubs or organizations: Not on file    Relationship status: Not on file  Other Topics Concern  . Not on file  Social History Narrative  . Not on file   Additional Social History:    Allergies:  No Known Allergies  Labs:  Results for orders placed or performed during the hospital encounter of 04/08/18 (from the past 48 hour(s))  Urinalysis, Complete w Microscopic     Status: Abnormal   Collection Time: 04/08/18  8:06 PM  Result Value Ref Range   Color, Urine YELLOW (A) YELLOW   APPearance CLEAR (A) CLEAR   Specific Gravity, Urine 1.029 1.005 - 1.030   pH 5.0  5.0 - 8.0   Glucose, UA NEGATIVE NEGATIVE mg/dL   Hgb urine dipstick NEGATIVE NEGATIVE   Bilirubin Urine NEGATIVE NEGATIVE   Ketones, ur 5 (A) NEGATIVE mg/dL   Protein, ur NEGATIVE NEGATIVE mg/dL   Nitrite NEGATIVE NEGATIVE   Leukocytes, UA NEGATIVE NEGATIVE   RBC / HPF 0-5 0 - 5 RBC/hpf   WBC, UA 0-5 0 - 5 WBC/hpf   Bacteria, UA NONE SEEN NONE SEEN   Squamous Epithelial / LPF 0-5 0 - 5   Mucus PRESENT     Comment: Performed at [facility], [address], [place], [place] [address]  Urine Drug Screen, Qualitative     Status: Abnormal   Collection Time: 04/08/18  8:06 PM  Result Value Ref Range   Tricyclic, Ur Screen NONE DETECTED NONE DETECTED   Amphetamines, Ur Screen NONE DETECTED NONE DETECTED   MDMA (Ecstasy)Ur Screen NONE DETECTED NONE DETECTED   Cocaine Metabolite,Ur [place] POSITIVE (A) NONE DETECTED   Opiate, Ur Screen NONE DETECTED NONE DETECTED   Phencyclidine (PCP) Ur S NONE DETECTED NONE DETECTED   Cannabinoid 50 Ng, Ur [place] POSITIVE (A) NONE DETECTED   Barbiturates, Ur Screen NONE DETECTED NONE DETECTED   Benzodiazepine, Ur Scrn NONE  DETECTED NONE DETECTED   Methadone Scn, Ur NONE DETECTED NONE DETECTED    Comment: (NOTE) Tricyclics + metabolites, urine    Cutoff 1000 ng/mL Amphetamines + metabolites, urine  Cutoff 1000 ng/mL MDMA (Ecstasy), urine              Cutoff 500 ng/mL Cocaine Metabolite, urine          Cutoff 300 ng/mL Opiate + metabolites, urine        Cutoff 300 ng/mL Phencyclidine (PCP), urine         Cutoff 25 ng/mL Cannabinoid, urine                 Cutoff 50 ng/mL Barbiturates + metabolites, urine  Cutoff 200 ng/mL Benzodiazepine, urine              Cutoff 200 ng/mL Methadone, urine                   Cutoff 300 ng/mL The urine drug screen provides only a preliminary, unconfirmed analytical test result and should not be used for non-medical purposes. Clinical consideration and professional judgment should be applied to any positive drug screen result due to possible interfering substances. A more specific alternate chemical method must be used in order to obtain a confirmed analytical result. Gas chromatography / mass spectrometry (GC/MS) is the preferred confirmat ory method. Performed at [facility], [address] [place], [place] [address]   Acetaminophen level     Status: Abnormal   Collection Time: 04/08/18  8:13 PM  Result Value Ref Range   Acetaminophen (Tylenol), Serum <10 (L) 10 - 30 ug/mL    Comment: (NOTE) Therapeutic concentrations vary significantly. A range of 10-30 ug/mL  may be an effective concentration for many patients. However, some  are best treated at concentrations outside of this range. Acetaminophen concentrations >150 ug/mL at 4 hours after ingestion  and >50 ug/mL at 12 hours after ingestion are often associated with  toxic reactions. Performed at [facility], [address] [place],  [address]   Comprehensive metabolic panel     Status: Abnormal   Collection Time: 04/08/18  8:13 PM  Result Value Ref Range   Sodium 138 135 - 145  mmol/L  Potassium 3.5 3.5 - 5.1 mmol/L   Chloride 108 101 - 111 mmol/L   CO2 23 22 - 32 mmol/L   Glucose, Bld 103 (H) 65 - 99 mg/dL   BUN 12 6 - 20 mg/dL   Creatinine, Ser 0.84 0.61 - 1.24 mg/dL   Calcium 8.5 (L) 8.9 - 10.3 mg/dL   Total Protein 7.2 6.5 - 8.1 g/dL   Albumin 3.3 (L) 3.5 - 5.0 g/dL   AST 36 15 - 41 U/L   ALT 32 17 - 63 U/L   Alkaline Phosphatase 31 (L) 38 - 126 U/L   Total Bilirubin 0.4 0.3 - 1.2 mg/dL   GFR calc non Af Amer >60 >60 mL/min   GFR calc Af Amer >60 >60 mL/min    Comment: (NOTE) The eGFR has been calculated using the CKD EPI equation. This calculation has not been validated in all clinical situations. eGFR's persistently <60 mL/min signify possible Chronic Kidney Disease.    Anion gap 7 5 - 15    Comment: Performed at [facility], [address] [place], [place] [address]  Ethanol     Status: None   Collection Time: 04/08/18  8:13 PM  Result Value Ref Range   Alcohol, Ethyl (B) <10 <10 mg/dL    Comment: (NOTE) Lowest detectable limit for serum alcohol is 10 mg/dL. For medical purposes only. Performed at [facility], [address] [place], [place] [address]   Salicylate level     Status: None   Collection Time: 04/08/18  8:13 PM  Result Value Ref Range   Salicylate Lvl <7.0 2.8 - 30.0 mg/dL    Comment: Performed at [facility], [address] [place], [place] [address]  CBC with Differential     Status: Abnormal   Collection Time: 04/08/18  8:13 PM  Result Value Ref Range   WBC 12.1 (H) 3.8 - 10.6 K/uL   RBC 4.34 (L) 4.40 - 5.90 MIL/uL   Hemoglobin 13.5 13.0 - 18.0 g/dL   HCT 37.9 (L) 40.0 - 52.0 %   MCV 87.3 80.0 - 100.0 fL   MCH 31.1 26.0 - 34.0 pg   MCHC 35.6 32.0 - 36.0 g/dL   RDW 15.1 (H) 11.5 - 14.5 %   Platelets 316 150 - 440 K/uL   Neutrophils Relative % 57 %   Neutro Abs 6.8 (H) 1.4 - 6.5 K/uL   Lymphocytes Relative 36 %   Lymphs Abs 4.4 (H) 1.0 - 3.6 K/uL   Monocytes Relative 6 %    Monocytes Absolute 0.8 0.2 - 1.0 K/uL   Eosinophils Relative 1 %   Eosinophils Absolute 0.1 0 - 0.7 K/uL   Basophils Relative 0 %   Basophils Absolute 0.0 0 - 0.1 K/uL    Comment: Performed at [facility], [address] [place], [place] [address]    Current Facility-Administered Medications  Medication Dose Route Frequency Provider Last Rate Last Dose  . albuterol (PROVENTIL HFA;VENTOLIN HFA) 108 (90 Base) MCG/ACT inhaler 2 puff  2 puff Inhalation Q6H PRN Courteny Egler T, MD      . chlordiazePOXIDE (LIBRIUM) capsule 25 mg  25 mg Oral TID Delando Satter T, MD      . paliperidone (INVEGA) 24 hr tablet 6 mg  6 mg Oral Q supper Giuliano Preece, [name], MD       Current Outpatient Medications  Medication Sig Dispense Refill  . albuterol (PROVENTIL HFA;VENTOLIN HFA) 108 (90 Base) MCG/ACT inhaler Inhale 2 puffs  into the lungs every 6 (six) hours as needed for wheezing or shortness of breath. 1 Inhaler 0  . azithromycin (ZITHROMAX Z-PAK) 250 MG tablet Take 2 tablets (500 mg) on  Day 1,  followed by 1 tablet (250 mg) once daily on Days 2 through 5. 6 each 0  . predniSONE (DELTASONE) 50 MG tablet Take 1 pill per day 5 tablet 0    Musculoskeletal: Strength & Muscle Tone: within normal limits Gait & Station: normal Patient leans: N/A  Psychiatric Specialty Exam: Physical Exam  Nursing note and vitals reviewed. Constitutional: He appears well-developed and well-nourished.  HENT:  Head: Normocephalic and atraumatic.  Eyes: Pupils are equal, round, and reactive to light. Conjunctivae are normal.  Neck: Normal range of motion.  Cardiovascular: Regular rhythm and normal heart sounds.  Respiratory: Effort normal. No respiratory distress.  GI: Soft.  Musculoskeletal: Normal range of motion.  Neurological: He is alert.  Skin: Skin is warm and dry.  Psychiatric: His affect is blunt. His speech is delayed. His speech is not tangential. He is not agitated and not aggressive. Thought content is  paranoid. Cognition and memory are impaired. He expresses impulsivity. He expresses suicidal ideation.    Review of Systems  Constitutional: Negative.   HENT: Negative.   Eyes: Negative.   Respiratory: Negative.   Cardiovascular: Negative.   Gastrointestinal: Negative.   Musculoskeletal: Negative.   Skin: Negative.   Neurological: Negative.   Psychiatric/Behavioral: Positive for depression, hallucinations, memory loss, substance abuse and suicidal ideas. The patient is nervous/anxious and has insomnia.     Blood pressure 114/69, pulse 87, temperature 98 F (36.7 C), temperature source Oral, resp. rate 18, height _0  (1.727 m), weight 95.3 kg (210 lb), SpO2 98 %.Body mass index is 31.93 kg/m.  General Appearance: Disheveled  Eye Contact:  Minimal  Speech:  Slow  Volume:  Decreased  Mood:  Anxious, Depressed and Dysphoric  Affect:  Congruent  Thought Process:  Goal Directed  Orientation:  Full (Time, Place, and Person)  Thought Content:  Hallucinations: Auditory and Rumination  Suicidal Thoughts:  Yes.  with intent/plan  Homicidal Thoughts:  No  Memory:  Immediate;   Fair Recent;   Fair Remote;   Fair  Judgement:  Impaired  Insight:  Fair  Psychomotor Activity:  Decreased  Concentration:  Concentration: Fair  Recall:  [employer] of Knowledge:  Fair  Language:  Fair  Akathisia:  No  Handed:  Right  AIMS (if indicated):     Assets:  Desire for Improvement Housing  ADL's:  Intact  Cognition:  Impaired,  Mild  Sleep:        Treatment Plan Summary: Daily contact with patient to assess and evaluate symptoms and progress in treatment, Medication management and Plan 42 year old man came into the hospital with symptoms of alcohol withdrawal drug screen positive for cocaine.  Patient is currently not showing any active signs of delirium.  He has been able to keep food down today vital signs fairly normal.  I have put in orders for a few days of Librium for detox which I think  is likely to be adequate.  I have restarted 6 mg in the evening of Invega since that was what he was on previously.  Full set of labs will be done including EKG.  Admit to psychiatric ward continue 15-minute checks.  Case reviewed with TTS and emergency room physician.  Disposition: Recommend psychiatric Inpatient admission when medically cleared. Supportive therapy provided about ongoing stressors.  Alethia Berthold, MD 04/09/2018 2:33 PM

## 2018-04-09 NOTE — ED Provider Notes (Signed)
-----------------------------------------   7:14 AM on 04/09/2018 -----------------------------------------   Height  (1.727 m), weight 95.3 kg (210 lb).  The patient had no acute events since last update.  Calm and cooperative at this time.  Disposition is pending Psychiatry/Behavioral Medicine team recommendations.     Irean Hong, MD 04/09/18 [phone]

## 2018-04-09 NOTE — BH Assessment (Signed)
Patient is to be admitted to [facility] by Dr. Toni Amend.  Attending Physician will be Dr. Johnella Moloney.   Patient has been assigned to room 312, by [facility] Charge Nurse T'Yawn.   Intake Paper Work has been signed and placed on patient chart.  ER staff is aware of the admission:  Glenda: ER Sectary   Dr. Roxan Hockey: ER MD   Amy: Patient's Nurse   Ethelene Browns: Patient Access.

## 2018-04-09 NOTE — Tx Team (Signed)
Initial Treatment Plan 04/09/2018 6:13 PM Dominic [name] QMV:[id number]    PATIENT STRESSORS: Financial difficulties Legal issue Marital or family conflict   PATIENT STRENGTHS: Ability for insight Capable of independent living Communication skills Motivation for treatment/growth Supportive family/friends   PATIENT IDENTIFIED PROBLEMS: Substance abuse  depression  Developing coping skills  Legal issues  Family issues             DISCHARGE CRITERIA:  Ability to meet basic life and health needs Improved stabilization in mood, thinking, and/or behavior Motivation to continue treatment in a less acute level of care  PRELIMINARY DISCHARGE PLAN: Attend PHP/IOP Return to previous living arrangement  PATIENT/FAMILY INVOLVEMENT: This treatment plan has been presented to and reviewed with the patient, Dominic [name], and/or family member.  The patient and family have been given the opportunity to ask questions and make suggestions.  [name], RN 04/09/2018, 6:13 PM

## 2018-04-09 NOTE — Consult Note (Signed)
Psychiatry: Brief note, full note to follow.  Patient will be admitted to psychiatric ward.

## 2018-04-09 NOTE — ED Notes (Signed)
Hourly rounding reveals patient sleeping in room. No complaints, stable, in no acute distress. Q15 minute rounds and monitoring via Security Cameras to continue. 

## 2018-04-09 NOTE — ED Notes (Signed)
Patient resting quietly in room. No noted distress or abnormal behaviors noted. Will continue 15 minute checks and observation by security camera for safety. 

## 2018-04-09 NOTE — ED Notes (Signed)
Hourly rounding reveals patient in room. No complaints, stable, in no acute distress. Q15 minute rounds and monitoring via Security Cameras to continue. 

## 2018-04-09 NOTE — ED Notes (Signed)
Pt discharged under IVC to BMU.  Pt accepting. All belongings sent with patient. Report called to Danella Deis, Charity fundraiser. VS stable.

## 2018-04-09 NOTE — ED Notes (Signed)
Pt accepting of transfer to BMU. No behavioral issues. Maintained on 15 minute checks and observation by security camera for safety.

## 2018-04-09 NOTE — Plan of Care (Signed)
Patient is alert and oriented x 4. Patient actively expresses self harm thoughts. Patient states yesterday he wanted to run out into the streets and has always had an obsession of running in the street since 42 years old. Patient states he was brought to the hospital because he went to [facility] and his medications were not there and he received a call that his children were placed in DSS custody and will be given to his sister. Patient states he lives with a fiance and really wants to get his children back. Patient states he uses cocaine and alcohol, and really wants a program for substance abuse. Patient states, " I really need help with drugs so I can get my kids back."  Patient states he has noticed a decrease in his appetite  and anxiety level 8/10, depression 8/10, and hopelessness 7/10. Patient smokes 1 Pack of cigarettes a day and would like a nicotine patch. Patient also states he drinks alcohol daily 40 ounces daily. Patient received alcohol education with teaching sheet. Patient verbalized understanding and was very interested in the teaching. Patient oriented to the unit and received bathing supplies, and dinner tray. Patient affect is pleasant but depressed. RN assisted with skin assessment by Myer Haff, RN. Patient has two tattoos on bilateral arms. Skin dry and intact. No contraband found. Nurse will continue to monitor patient.

## 2018-04-10 DIAGNOSIS — F209 Schizophrenia, unspecified: Principal | ICD-10-CM

## 2018-04-10 LAB — LIPID PANEL
CHOL/HDL RATIO: 3.4 ratio
CHOLESTEROL: 150 mg/dL (ref 0–200)
HDL: 44 mg/dL (ref >40)
LDL Cholesterol: 81 mg/dL (ref 0–99)
Triglycerides: 126 mg/dL (ref <150)
VLDL: 25 mg/dL (ref 0–40)

## 2018-04-10 LAB — TSH: TSH: 2.617 u[IU]/mL (ref 0.350–4.500)

## 2018-04-10 LAB — HEMOGLOBIN A1C
Hgb A1c MFr Bld: 5.3 % (ref 4.8–5.6)
MEAN PLASMA GLUCOSE: 105.41 mg/dL

## 2018-04-10 MED ORDER — ARIPIPRAZOLE ER 400 MG IM SRER
400.0000 mg | INTRAMUSCULAR | Status: DC
  Filled 2018-04-10: qty 2

## 2018-04-10 MED ORDER — ARIPIPRAZOLE ER 400 MG IM SRER
400.0000 mg | INTRAMUSCULAR | Status: DC
  Administered 2018-04-10: 400 mg via INTRAMUSCULAR
  Filled 2018-04-10: qty 2

## 2018-04-10 NOTE — Progress Notes (Signed)
Recreation Therapy Notes  Date: 04/10/2018  Time: 9:30 am   Location: Craft Room   Behavioral response: N/A   Intervention Topic:  Problem Solving  Discussion/Intervention: Patient did not attend group.   Clinical Observations/Feedback:  Patient did not attend group.   Keishawna Carranza LRT/CTRS         Nyliah Nierenberg 04/10/2018 10:23 AM 
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
## 2018-04-10 NOTE — Progress Notes (Signed)
D- Patient alert and oriented. Patient presents in a pleasant mood on assessment stating that he slept "ok" last night. Patient complains of back and chest pain, rating his pain level a "7/10", and he did request pain medication from this Clinical research associate. Patient denies SI, HI, AVH, at this time. Patient also denies any signs/symptoms of depression/anxiety at this time. Patient's goal for today is to "go to a recovery house to start somewhere, I spend a lot of money on cocaine and alcohol. I got to get my kids back, DSS got them".  A- Scheduled medications administered to patient, per MD orders. Support and encouragement provided.  Routine safety checks conducted every 15 minutes.  Patient informed to notify staff with problems or concerns.  R- No adverse drug reactions noted. Patient contracts for safety at this time. Patient compliant with medications and treatment plan. Patient receptive, calm, and cooperative. Patient interacts well with others on the unit.  Patient remains safe at this time.

## 2018-04-10 NOTE — Tx Team (Addendum)
Interdisciplinary Treatment and Diagnostic Plan Update  04/10/2018 Time of Session: [name] MRN: [id number]  Principal Diagnosis: <principal problem not specified>  Secondary Diagnoses: Active Problems:   Schizophrenia (HCC)   Current Medications:  Current Facility-Administered Medications  Medication Dose Route Frequency Provider Last Rate Last Dose  . acetaminophen (TYLENOL) tablet 650 mg  650 mg Oral Q6H PRN Clapacs, Jackquline Denmark, MD   650 mg at 04/10/18 0825  . albuterol (PROVENTIL HFA;VENTOLIN HFA) 108 (90 Base) MCG/ACT inhaler 2 puff  2 puff Inhalation Q6H PRN Clapacs, John T, MD      . alum & mag hydroxide-simeth (MAALOX/MYLANTA) 200-200-20 MG/5ML suspension 30 mL  30 mL Oral Q4H PRN Clapacs, John T, MD      . ARIPiprazole ER (ABILIFY MAINTENA) injection 400 mg  400 mg Intramuscular Q28 days McNew, Ileene Hutchinson, MD      . chlordiazePOXIDE (LIBRIUM) capsule 25 mg  25 mg Oral TID Clapacs, John T, MD   25 mg at 04/10/18 0825  . hydrOXYzine (ATARAX/VISTARIL) tablet 50 mg  50 mg Oral TID PRN Clapacs, Jackquline Denmark, MD   50 mg at 04/09/18 2205  . magnesium hydroxide (MILK OF MAGNESIA) suspension 30 mL  30 mL Oral Daily PRN Clapacs, John T, MD      . traZODone (DESYREL) tablet 100 mg  100 mg Oral QHS PRN Clapacs, Jackquline Denmark, MD   100 mg at 04/09/18 2205   PTA Medications: Medications Prior to Admission  Medication Sig Dispense Refill Last Dose  . albuterol (PROVENTIL HFA;VENTOLIN HFA) 108 (90 Base) MCG/ACT inhaler Inhale 2 puffs into the lungs every 6 (six) hours as needed for wheezing or shortness of breath. 1 Inhaler 0   . azithromycin (ZITHROMAX Z-PAK) 250 MG tablet Take 2 tablets (500 mg) on  Day 1,  followed by 1 tablet (250 mg) once daily on Days 2 through 5. 6 each 0   . predniSONE (DELTASONE) 50 MG tablet Take 1 pill per day 5 tablet 0     Patient Stressors: Financial difficulties Legal issue Marital or family conflict  Patient Strengths: Ability for insight Capable of independent  living Manufacturing systems engineer Motivation for treatment/growth Supportive family/friends  Treatment Modalities: Medication Management, Group therapy, Case management,  1 to 1 session with clinician, Psychoeducation, Recreational therapy.   Physician Treatment Plan for Primary Diagnosis: <principal problem not specified> Long Term Goal(s):     Short Term Goals:    Medication Management: Evaluate patient's response, side effects, and tolerance of medication regimen.  Therapeutic Interventions: 1 to 1 sessions, Unit Group sessions and Medication administration.  Evaluation of Outcomes: Progressing  Physician Treatment Plan for Secondary Diagnosis: Active Problems:   Schizophrenia (HCC)  Long Term Goal(s):     Short Term Goals:       Medication Management: Evaluate patient's response, side effects, and tolerance of medication regimen.  Therapeutic Interventions: 1 to 1 sessions, Unit Group sessions and Medication administration.  Evaluation of Outcomes: Progressing   RN Treatment Plan for Primary Diagnosis: <principal problem not specified> Long Term Goal(s): Knowledge of disease and therapeutic regimen to maintain health will improve  Short Term Goals: Ability to verbalize feelings will improve, Ability to identify and develop effective coping behaviors will improve and Compliance with prescribed medications will improve  Medication Management: RN will administer medications as ordered by provider, will assess and evaluate patient's response and provide education to patient for prescribed medication. RN will report any adverse and/or side effects to prescribing provider.  Therapeutic  Interventions: 1 on 1 counseling sessions, Psychoeducation, Medication administration, Evaluate responses to treatment, Monitor vital signs and CBGs as ordered, Perform/monitor CIWA, COWS, AIMS and Fall Risk screenings as ordered, Perform wound care treatments as ordered.  Evaluation of Outcomes:  Progressing   LCSW Treatment Plan for Primary Diagnosis: <principal problem not specified> Long Term Goal(s): Safe transition to appropriate next level of care at discharge, Engage patient in therapeutic group addressing interpersonal concerns.  Short Term Goals: Engage patient in aftercare planning with referrals and resources, Facilitate patient progression through stages of change regarding substance use diagnoses and concerns, Identify triggers associated with mental health/substance abuse issues and Increase skills for wellness and recovery  Therapeutic Interventions: Assess for all discharge needs, 1 to 1 time with Social worker, Explore available resources and support systems, Assess for adequacy in community support network, Educate family and significant other(s) on suicide prevention, Complete Psychosocial Assessment, Interpersonal group therapy.  Evaluation of Outcomes: Progressing   Progress in Treatment: Attending groups: No. Participating in groups: No. Taking medication as prescribed: Yes. Toleration medication: Yes. Family/Significant other contact made: No, will contact:  Patients mother or sister Patient understands diagnosis: Yes. Discussing patient identified problems/goals with staff: Yes. Medical problems stabilized or resolved: Yes. Denies suicidal/homicidal ideation: Yes. Issues/concerns per patient self-inventory: No. Other:    New problem(s) identified: No, Describe:  None  New Short Term/Long Term Goal(s): "To get on the right path into a substance abuse program."   Patient Goals:  "To get on the right path into a substance abuse program."  Discharge Plan or Barriers: To discharge to an substance abuse residential treatment program.   Reason for Continuation of Hospitalization: Depression Medication stabilization  Estimated Length of Stay: 3-5 days    Attendees: Patient: Dominic [name] 04/10/2018 11:54 AM  Physician: Corinna Gab, MD 04/10/2018 11:54  AM  Nursing: Hulan Amato, RN 04/10/2018 11:54 AM  RN Care Manager: 04/10/2018 11:54 AM  Social Worker: Johny Shears, LCSWA 04/10/2018 11:54 AM  Recreational Therapist: Danella Deis. Dreama Saa, LRT 04/10/2018 11:54 AM  Other: Huey Romans, LCSW 04/10/2018 11:54 AM  Other:  04/10/2018 11:54 AM  Other: 04/10/2018 11:54 AM    Scribe for Treatment Team: Johny Shears, LCSW 04/10/2018 11:54 AM

## 2018-04-10 NOTE — H&P (Signed)
Psychiatric Admission Assessment Adult  Patient Identification: Dominic [name] MRN:  [id number] Date of Evaluation:  04/10/2018 Chief Complaint:  AH, SI Principal Diagnosis: Schizophrenia (HCC) Diagnosis:   Patient Active Problem List   Diagnosis Date Noted  . Schizophrenia (HCC) [F20.9] 02/16/2016    Priority: High  . Alcohol abuse [F10.10] 02/16/2016    Priority: High  . Cocaine abuse (HCC) [F14.10] 02/16/2016    Priority: High  . 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 He has been using alcohol and drugs heavily. Pt reports a history of Schizophrenia. He was stable on [place] for many years however became tolerant. HE states that he was switched to Aristad about 3 months ago and this medication was helpful for voices. He states that him and his fiance were evicted from their apartment. DSS got involved and his kids got put into the care of his sister. Since then, he has been very depressed and has been drinking a half gallon of vodka a day and using crack cocaine daily. He states taht his fiance has also been using heavily. He started to hear voices very loudly. He was a few day late on his injection which was due last week. However, they did not have it in stock and told him to come back today to get it. He states that he also started feeling suicidal and was very close to running into traffic. He states that his sister called the police before he did. He states that he has long history of running into traffic. He laughs about it and states, "I guess it's my favorite thing to do when I'm depressed." He states that he is desperate to get sober so that he can get his kids back. He had court date today for the eviction and then they have 10 days after to get out of the apartment. He is not sure what they plan to do. He is on disability and gets $770 a month. He states  taht he wants help getting back on his medications and also wants to go to residential treatment.   Associated Signs/Symptoms: Depression Symptoms:  depressed mood, anhedonia, insomnia, fatigue, hopelessness, suicidal thoughts with specific plan, (Hypo) Manic Symptoms:  Impulsivity, Anxiety Symptoms:  Excessive Worry, Psychotic Symptoms:  Hallucinations: Auditory PTSD Symptoms: Negative Total Time spent with patient: 1 hour  Past Psychiatric History: Pt reports history of schizophrenia diagnosed when he was in prison in 2004. He reports that he sees Dr. Mervyn Skeeters at [place]. He states that he was on [place] for 7-8 years but "became tolerant" He reports 3-4 inpatient admissions in the past. He reports many suicide attempts by running into traffic.   Is the patient at risk to self? Yes.    Has the patient been a risk to self in the past 6 months? No.  Has the patient been a risk to self within the distant past? Yes.    Is the patient a risk to others? No.  Has the patient been a risk to others in the past 6 months? No.  Has the patient been a risk to others within the distant past? No.   Prior Inpatient Therapy:   Prior Outpatient Therapy:    Alcohol Screening: 1. How often do you have a drink containing alcohol?: 4 or more times a week 2. How many drinks containing alcohol do you have on a typical  day when you are drinking?: 10 or more 3. How often do you have six or more drinks on one occasion?: Daily or almost daily AUDIT-C Score: 12 4. How often during the last year have you found that you were not able to stop drinking once you had started?: Less than monthly 5. How often during the last year have you failed to do what was normally expected from you becasue of drinking?: Less than monthly 6. How often during the last year have you needed a first drink in the morning to get yourself going after a heavy drinking session?: Less than monthly 7. How often during the last year have  you had a feeling of guilt of remorse after drinking?: Less than monthly 8. How often during the last year have you been unable to remember what happened the night before because you had been drinking?: Less than monthly 9. Have you or someone else been injured as a result of your drinking?: No 10. Has a relative or friend or a doctor or another health worker been concerned about your drinking or suggested you cut down?: No Alcohol Use Disorder Identification Test Final Score (AUDIT): 17 Intervention/Follow-up: Alcohol Education Substance Abuse History in the last 12 months:  Yes.   , he reports drinking 1/2 gallon of vodka daily and daily crack cocaine use. Has never been in rehab  Consequences of Substance Abuse: Medical Consequences:  psychosis Legal Consequences:  Long history of drug charges  Lost children Previous Psychotropic Medications: No  Psychological Evaluations: Yes  Past Medical History:  Past Medical History:  Diagnosis Date  . Bipolar 1 disorder (HCC)   . Schizophrenia [facility])     Past Surgical History:  Procedure Laterality Date  . CYST REMOVAL HAND     neck   Family History: History reviewed. No pertinent family history. Family Psychiatric  History: Unknown Tobacco Screening: Have you used any form of tobacco in the last 30 days? (Cigarettes, Smokeless Tobacco, Cigars, and/or Pipes): Yes Tobacco use, Select all that apply: 5 or more cigarettes per day Are you interested in Tobacco Cessation Medications?: Yes, will notify MD for an order Counseled patient on smoking cessation including recognizing danger situations, developing coping skills and basic information about quitting provided: Yes Social History: Born and raised in [place]. He lives in [place] in an apartment with his fiance. However, they are being evicted. He has been with his fiance for 9 years. She is also abusing drugs. They have 2 kids ages 36 and 8 who are currently in the custody of his  sister. DSS is involved. He has 4 siblings and close to all of them. He is on disability and gets $770 a month. He reports long history of prison. He has been in and out of prison all of his life for various charges. He has not been in prison since 2009.  Social History   Substance and Sexual Activity  Alcohol Use Yes  . Alcohol/week: 4.2 oz  . Types: 7 Shots of liquor per week   Comment: Pt states he drinks beer however states he is not sure how much he consumes     Social History   Substance and Sexual Activity  Drug Use Yes  . Types: Cocaine, Marijuana    Additional Social History: Marital status: Long term relationship Long term relationship, how long?: 9 years What types of issues is patient dealing with in the relationship?: Substance abuse Are you sexually active?: Yes What is your sexual orientation?:  Heterosexual Does patient have children?: Yes How many children?: 2 How is patient's relationship with their children?: (8 & 13) great relationship, lives with his sister                         Allergies:  No Known Allergies Lab Results:  Results for orders placed or performed during the hospital encounter of 04/09/18 (from the past 48 hour(s))  Hemoglobin A1c     Status: None   Collection Time: 04/08/18  8:06 PM  Result Value Ref Range   Hgb A1c MFr Bld 5.3 4.8 - 5.6 %    Comment: (NOTE) Pre diabetes:          5.7%-6.4% Diabetes:              >6.4% Glycemic control for   <7.0% adults with diabetes    Mean Plasma Glucose 105.41 mg/dL    Comment: Performed at [facility], [address] [place], [place] [address]  Lipid panel     Status: None   Collection Time: 04/08/18  8:06 PM  Result Value Ref Range   Cholesterol 150 0 - 200 mg/dL   Triglycerides 604 <540 mg/dL   HDL 44 >98 mg/dL   Total CHOL/HDL Ratio 3.4 RATIO   VLDL 25 0 - 40 mg/dL   LDL Cholesterol 81 0 - 99 mg/dL    Comment:        Total Cholesterol/HDL:CHD Risk Coronary Heart Disease  Risk Table                     Men   Women  1/2 Average Risk   3.4   3.3  Average Risk       5.0   4.4  2 X Average Risk   9.6   7.1  3 X Average Risk  23.4   11.0        Use the calculated Patient Ratio above and the CHD Risk Table to determine the patient's CHD Risk.        ATP III CLASSIFICATION (LDL):  <100     mg/dL   Optimal  119-147  mg/dL   Near or Above                    Optimal  130-159  mg/dL   Borderline  829-562  mg/dL   High  >130     mg/dL   Very High Performed at [facility], [address] [place], [place] [address]   TSH     Status: None   Collection Time: 04/08/18  8:06 PM  Result Value Ref Range   TSH 2.617 0.350 - 4.500 uIU/mL    Comment: Performed by a 3rd Generation assay with a functional sensitivity of <=0.01 uIU/mL. Performed at [facility], [address] [place], [place] [address]     Blood Alcohol level:  Lab Results  Component Value Date   [facility] <10 04/08/2018   ETH <10 11/15/2017    Metabolic Disorder Labs:  Lab Results  Component Value Date   HGBA1C 5.3 04/08/2018   MPG 105.41 04/08/2018   No results found for: PROLACTIN Lab Results  Component Value Date   CHOL 150 04/08/2018   TRIG 126 04/08/2018   HDL 44 04/08/2018   CHOLHDL 3.4 04/08/2018   VLDL 25 04/08/2018   LDLCALC 81 04/08/2018    Current Medications: Current Facility-Administered Medications  Medication Dose Route  Frequency Provider Last Rate Last Dose  . acetaminophen (TYLENOL) tablet 650 mg  650 mg Oral Q6H PRN Clapacs, Jackquline Denmark, MD   650 mg at 04/10/18 0825  . albuterol (PROVENTIL HFA;VENTOLIN HFA) 108 (90 Base) MCG/ACT inhaler 2 puff  2 puff Inhalation Q6H PRN Clapacs, John T, MD      . alum & mag hydroxide-simeth (MAALOX/MYLANTA) 200-200-20 MG/5ML suspension 30 mL  30 mL Oral Q4H PRN Clapacs, John T, MD      . ARIPiprazole ER (ABILIFY MAINTENA) injection 400 mg  400 mg Intramuscular Q28 days Keitha Kolk, Ileene Hutchinson, MD      . chlordiazePOXIDE  (LIBRIUM) capsule 25 mg  25 mg Oral TID Clapacs, John T, MD   25 mg at 04/10/18 0825  . hydrOXYzine (ATARAX/VISTARIL) tablet 50 mg  50 mg Oral TID PRN Clapacs, Jackquline Denmark, MD   50 mg at 04/09/18 2205  . magnesium hydroxide (MILK OF MAGNESIA) suspension 30 mL  30 mL Oral Daily PRN Clapacs, John T, MD      . traZODone (DESYREL) tablet 100 mg  100 mg Oral QHS PRN Clapacs, Jackquline Denmark, MD   100 mg at 04/09/18 2205   PTA Medications: Medications Prior to Admission  Medication Sig Dispense Refill Last Dose  . albuterol (PROVENTIL HFA;VENTOLIN HFA) 108 (90 Base) MCG/ACT inhaler Inhale 2 puffs into the lungs every 6 (six) hours as needed for wheezing or shortness of breath. 1 Inhaler 0   . azithromycin (ZITHROMAX Z-PAK) 250 MG tablet Take 2 tablets (500 mg) on  Day 1,  followed by 1 tablet (250 mg) once daily on Days 2 through 5. 6 each 0   . predniSONE (DELTASONE) 50 MG tablet Take 1 pill per day 5 tablet 0     Musculoskeletal: Strength & Muscle Tone: within normal limits Gait & Station: normal Patient leans: N/A  Psychiatric Specialty Exam: Physical Exam  ROS  Blood pressure 115/75, pulse (!) 48, temperature 97.9 F (36.6 C), temperature source Oral, resp. rate 20, height  (1.727 m), weight 90.7 kg (200 lb), SpO2 100 %.Body mass index is 30.41 kg/m.  General Appearance: Casual  Eye Contact:  Good  Speech:  Clear and Coherent  Volume:  Normal  Mood:  Depressed  Affect:  Congruent  Thought Process:  Coherent and Goal Directed  Orientation:  Full (Time, Place, and Person)  Thought Content:  Hallucinations: Auditory  Suicidal Thoughts:  Yes.  with intent/plan  Homicidal Thoughts:  No  Memory:  Immediate;   Fair  Judgement:  Fair  Insight:  Fair  Psychomotor Activity:  Normal  Concentration:  Concentration: Fair  Recall:  [employer] of Knowledge:  Fair  Language:  Fair  Akathisia:  No      Assets:  Resilience  ADL's:  Intact  Cognition:  WNL  Sleep:  Number of Hours: 8.15     Treatment Plan Summary: 42 yo male admitted due to SI and AH. He is also using alcohol and drugs heavily. He is about a week late on his Aristada injection as [name] did not have it in stock. I did confirm this with [name]. IT was due on 5/22 but they did not have it so was told to come back this week. He reports some improvement in voices today after a good night sleep. His symptoms likely have a lot to do with heavy alcohol and crack use. He is depressed due to his kids getting taken away and is motivated for sobriety. He  would like to be referred for residential treatment.   Plan:  Schizophrenia -He is on Aristada 441 mg q 28 days. Last dose was due 5/22. We do not have Aristada in our pharmacy so will given Maintenna 400 mg today.  -Ekg not completed yet. Will reorder -TSH, Lipid, a1c, CMP, CBC reviewed   Alcohol use disorder/W/D -He is on Librium taper and CIWA  Crack Cocaine use disorder -CSW to refer to residential treatment.  Dispo -CSW to refer to residential treatment. He follows up with [name]  Observation Level/Precautions:  15 minute checks  Laboratory:  Done in ED  Psychotherapy:    Medications:    Consultations:    Discharge Concerns:    Estimated LOS: 3-5 days  Other:     Physician Treatment Plan for Primary Diagnosis: Schizophrenia (HCC) Long Term Goal(s): Improvement in symptoms so as ready for discharge  Short Term Goals: Ability to verbalize feelings will improve   I certify that inpatient services furnished can reasonably be expected to improve the patient's condition.    Haskell Riling, MD 5/29/201912:14 PM

## 2018-04-10 NOTE — Progress Notes (Signed)
  patient is alert and oriented x 4 denies pain or discomfort no distress noted. Patient's thoughts are organized and coherent no bizarre behavior noted, Patient was noted interacting appropriately with peers and staff and he attended evening group and he behaved appropriately. 15 minutes safety checks maintained, will  continue to closely monitor.

## 2018-04-10 NOTE — Plan of Care (Signed)
Patient verbalizes understanding of the general information that has been provided to him and has not voiced any further questions or concerns at this time. Patient denies SI/HI/AVH as well as any signs/symptoms of depression/anxiety at this time. Patient has the ability to verbalize his anger and frustrations appropriately as well as demonstrate self-control on the unit. Patient has remained free from injury and is safe on the unit at this time.  Problem: Education: Goal: Knowledge of [facility] Education information/materials will improve Outcome: Progressing Goal: Emotional status will improve Outcome: Progressing Goal: Mental status will improve Outcome: Progressing Goal: Verbalization of understanding the information provided will improve Outcome: Progressing   Problem: Coping: Goal: Ability to verbalize frustrations and anger appropriately will improve Outcome: Progressing Goal: Ability to demonstrate self-control will improve Outcome: Progressing

## 2018-04-10 NOTE — [facility] Suicide Risk Assessment (Signed)
[facility] INPATIENT:  Family/Significant Other Suicide Prevention Education  Suicide Prevention Education:  Contact Attempts:Christine Zambito, Pts. Mother [phone] and Romie Jumper, Pts. Sistser, [phone] has been identified by the patient as the family member/significant other with whom the patient will be residing, and identified as the person(s) who will aid the patient in the event of a mental health crisis.  With written consent from the patient, two attempts were made to provide suicide prevention education, prior to and/or following the patient's discharge.  We were unsuccessful in providing suicide prevention education.  A suicide education pamphlet was given to the patient to share with family/significant other.  Date and time of first attempt:CSW attempted to contact the patients mother and sister to complete SPE. CSW left a voicemail for them to return the call. 04/07/2018 at 12L45pm.  Date and time of second attempt:  Johny Shears 04/10/2018, 12:48 PM

## 2018-04-10 NOTE — [facility] Suicide Risk Assessment (Signed)
[facility] INPATIENT:  Family/Significant Other Suicide Prevention Education  Suicide Prevention Education:  Education Completed; Romie Jumper, Pts. Sistser, [phone]   has been identified by the patient as the family member/significant other with whom the patient will be residing, and identified as the person(s) who will aid the patient in the event of a mental health crisis (suicidal ideations/suicide attempt).  With written consent from the patient, the family member/significant other has been provided the following suicide prevention education, prior to the and/or following the discharge of the patient.  The suicide prevention education provided includes the following:  Suicide risk factors  Suicide prevention and interventions  National Suicide Hotline telephone number  [facility] assessment telephone number  [facility] Emergency Assistance 911  [facility] and/or Residential Mobile Crisis Unit telephone number  Request made of family/significant other to:  Remove weapons (e.g., guns, rifles, knives), all items previously/currently identified as safety concern.    Remove drugs/medications (over-the-counter, prescriptions, illicit drugs), all items previously/currently identified as a safety concern.  The family member/significant other verbalizes understanding of the suicide prevention education information provided.  The family member/significant other agrees to remove the items of safety concern listed above.  CSW spoke with the patients sister Patsy Lager. She mentioned concerns about the patients substance use and how she wants him to go to treatment. She mentions that he has a drug problem. She reports that the patient does not have access to any guns/weapons. The family member/significant other verbalizes understanding of the suicide prevention education information provided.  The family member/significant other agrees to remove the items of safety concern listed  above if applicable.    Johny Shears 04/10/2018, 1:26 PM

## 2018-04-10 NOTE — Progress Notes (Signed)
Recreation Therapy Notes  INPATIENT RECREATION THERAPY ASSESSMENT  Patient Details Name: Dominic [name] MRN: [id number] DOB: 1976-09-22 Today's Date: 04/10/2018       Information Obtained From: Patient  Able to Participate in Assessment/Interview: Yes  Patient Presentation: Responsive  Reason for Admission (Per Patient): Active Symptoms, Substance Abuse, Suicide Attempt  Patient Stressors: Family, Other (Comment)(Dss took my kids and place them with my sister)  Coping Skills:   Substance Abuse, Other (Comment)([employer] stop)  Leisure Interests (2+):  Games - Cards, Sports - Basketball  Frequency of Recreation/Participation: Weekly  Awareness of Community Resources:     [name]:     Current Use:    If no, Barriers?:    Expressed Interest in [employer] Information:    [employer] of Residence:  Film/video editor  Patient Main Form of Transportation: Walk  Patient Strengths:  Sense of Humor  Patient Identified Areas of Improvement:  Stay off Alcohol  Patient Goal for Hospitalization:  Find treatment and get my kids back  Current SI (including self-harm):  No  Current HI:  No  Current AVH: No  Staff Intervention Plan: Group Attendance, Collaborate with Interdisciplinary Treatment Team  Consent to Intern Participation: N/A  Dominic [name] 04/10/2018, 3:29 PM

## 2018-04-10 NOTE — [facility] Suicide Risk Assessment (Signed)
[facility] Admission Suicide Risk Assessment   Nursing information obtained from:  Patient Demographic factors:  Male Current Mental Status:  Suicidal ideation indicated by patient Loss Factors:  Legal issues Historical Factors:  Prior suicide attempts, Family history of mental illness or substance abuse Risk Reduction Factors:  Responsible for children under 41 years of age, Sense of responsibility to family  Total Time spent with patient: 45 minutes Principal Problem: Schizophrenia (HCC) Diagnosis:   Patient Active Problem List   Diagnosis Date Noted  . Schizophrenia (HCC) [F20.9] 02/16/2016    Priority: High  . Alcohol abuse [F10.10] 02/16/2016    Priority: High  . Cocaine abuse (HCC) [F14.10] 02/16/2016    Priority: High  . 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  World Science writer [facility]). Score between 0-7:  no or low risk or alcohol related problems. Score between 8-15:  moderate risk of alcohol related problems. Score between 16-19:  high risk of alcohol related problems. Score 20 or above:  warrants further diagnostic evaluation for alcohol dependence and treatment.   CLINICAL FACTORS:   Alcohol/Substance Abuse/Dependencies    COGNITIVE FEATURES THAT CONTRIBUTE TO RISK:  None    SUICIDE RISK:   Moderate:  Frequent suicidal ideation with limited intensity, and duration, some specificity in terms of plans, no associated intent, good self-control, limited dysphoria/symptomatology, some risk factors present, and identifiable protective factors, including available and accessible social support.  PLAN OF CARE: See H&P  I certify that inpatient services furnished can reasonably be expected to improve the patient's condition.   Haskell Riling,  MD 04/10/2018, 12:50 PM

## 2018-04-10 NOTE — BHH Counselor (Signed)
CSW called around to look for residential treatment for the patient. Patient request no longer than 6 months of residential treatment. [facility] does not have beds available. [facility] has a waiting list. [facility] does not have any male beds available at this time. [facility] does not have any beds available at this time. [facility] says that the patient can call for a phone interview tomorrow 04/11/2018 at 10am. CSW will continue to look for treatment for the patient.  Johny Shears, MSW, [place], LCASA 04/10/2018 1:17 PM

## 2018-04-10 NOTE — Plan of Care (Signed)
  Problem: Coping: Goal: Ability to verbalize frustrations and anger appropriately will improve Outcome: Progressing Goal: Ability to demonstrate self-control will improve Outcome: Progressing  Patient appears less anxious interacting appropriately with peers and staff.

## 2018-04-10 NOTE — [facility] Group Notes (Signed)
LCSW Group Therapy Note  04/10/2018 1:00 pm  Type of Therapy/Topic:  Group Therapy:  Emotion Regulation  Participation Level:  Did Not Attend   Description of Group:    The purpose of this group is to assist patients in learning to regulate negative emotions and experience positive emotions. Patients will be guided to discuss ways in which they have been vulnerable to their negative emotions. These vulnerabilities will be juxtaposed with experiences of positive emotions or situations, and patients will be challenged to use positive emotions to combat negative ones. Special emphasis will be placed on coping with negative emotions in conflict situations, and patients will process healthy conflict resolution skills.  Therapeutic Goals: 1. Patient will identify two positive emotions or experiences to reflect on in order to balance out negative emotions 2. Patient will label two or more emotions that they find the most difficult to experience 3. Patient will demonstrate positive conflict resolution skills through discussion and/or role plays  Summary of Patient Progress:  Ioane was invited to today's group, but chose not to attend.     Therapeutic Modalities:   Cognitive Behavioral Therapy Feelings Identification Dialectical Behavioral Therapy

## 2018-04-11 MED ORDER — TRAZODONE HCL 100 MG PO TABS
150.0000 mg | ORAL_TABLET | Freq: Every evening | ORAL | Status: DC | PRN
  Administered 2018-04-11: 150 mg via ORAL
  Filled 2018-04-11: qty 1

## 2018-04-11 MED ORDER — ARIPIPRAZOLE ER 400 MG IM SRER: 400.0000 mg | INTRAMUSCULAR | 0 refills | Status: DC

## 2018-04-11 NOTE — Plan of Care (Signed)
Patient verbalizes understanding of the general information that has been provided to him and has not voiced any further questions or concerns at this time. Patient denies SI/HI/AVH as well as any signs/symptoms of depression/anxiety at this time. Patient has the ability to verbalize his anger and frustrations appropriately as well as demonstrate self-control on the unit. Patient's goal for today is to "call [facility], I hope they'll accept me". Patient has remained free from injury and is safe on the unit at this time.  Problem: Education: Goal: Knowledge of [place] [facility] Education information/materials will improve Outcome: Progressing Goal: Emotional status will improve Outcome: Progressing Goal: Mental status will improve Outcome: Progressing Goal: Verbalization of understanding the information provided will improve Outcome: Progressing   Problem: Coping: Goal: Ability to verbalize frustrations and anger appropriately will improve Outcome: Progressing Goal: Ability to demonstrate self-control will improve Outcome: Progressing

## 2018-04-11 NOTE — BHH Counselor (Signed)
Patient was accepted into [facility] and can take the bus at discharge there.   Johny Shears, MSW, Theresia Majors, Bridget Hartshorn Clinical Social Worker 04/11/2018 11:06 AM

## 2018-04-11 NOTE — Progress Notes (Signed)
[facility] MD Progress Note  04/11/2018 2:37 PM Dominic [name]  MRN:  [id number]   Subjective:  Pt got accepted into [facility] for treatment. After he found this out, he reported much more hope and improvement in mood. HE denies SI or self harm thoughts. He reports the voices are better after getting Abilify Maintenna yesterday. Denies HI. He is sleeping and eating well.  Principal Problem: Schizophrenia (HCC) Diagnosis:   Patient Active Problem List   Diagnosis Date Noted  . Schizophrenia (HCC) [F20.9] 02/16/2016    Priority: High  . Alcohol abuse [F10.10] 02/16/2016    Priority: High  . Cocaine abuse (HCC) [F14.10] 02/16/2016    Priority: High  . 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 Bipolar 1 disorder (HCC)   . Schizophrenia [facility])     Past Surgical History:  Procedure Laterality Date  . CYST REMOVAL HAND     neck   Family History: History reviewed. No pertinent family history. Family Psychiatric  History: See H&P Social History:  Social History   Substance and Sexual Activity  Alcohol Use Yes  . Alcohol/week: 4.2 oz  . Types: 7 Shots of liquor per week   Comment: Pt states he drinks beer however states he is not sure how much he consumes     Social History   Substance and Sexual Activity  Drug Use Yes  . Types: Cocaine, Marijuana    Social History   Socioeconomic History  . Marital status: Single    Spouse name: Not on file  . Number of children: Not on file  . Years of education: Not on file  . Highest education level: Not on file  Occupational History  . Not on file  Social Needs  . Financial resource strain: Not on file  . Food insecurity:    Worry: Not on file    Inability: Not on file  . Transportation needs:    Medical: Not on file    Non-medical: Not on file  Tobacco Use  . Smoking status: Current  Every Day Smoker    Packs/day: 1.00    Types: Cigarettes  . Smokeless tobacco: Never Used  Substance and Sexual Activity  . Alcohol use: Yes    Alcohol/week: 4.2 oz    Types: 7 Shots of liquor per week    Comment: Pt states he drinks beer however states he is not sure how much he consumes  . Drug use: Yes    Types: Cocaine, Marijuana  . Sexual activity: Not on file  Lifestyle  . Physical activity:    Days per week: Not on file    Minutes per session: Not on file  . Stress: Not on file  Relationships  . Social connections:    Talks on phone: Not on file    Gets together: Not on file    Attends religious service: Not on file    Active member of club or organization: Not on file    Attends meetings of clubs or organizations: Not on file    Relationship status: Not on file  Other Topics Concern  . Not on file  Social History Narrative  . Not on file   Additional Social History:                         Sleep:  Good  Appetite:  Good  Current Medications: Current Facility-Administered Medications  Medication Dose Route Frequency Provider Last Rate Last Dose  . acetaminophen (TYLENOL) tablet 650 mg  650 mg Oral Q6H PRN Clapacs, Jackquline Denmark, MD   650 mg at 04/10/18 0825  . albuterol (PROVENTIL HFA;VENTOLIN HFA) 108 (90 Base) MCG/ACT inhaler 2 puff  2 puff Inhalation Q6H PRN Clapacs, John T, MD      . alum & mag hydroxide-simeth (MAALOX/MYLANTA) 200-200-20 MG/5ML suspension 30 mL  30 mL Oral Q4H PRN Clapacs, John T, MD      . ARIPiprazole ER (ABILIFY MAINTENA) injection 400 mg  400 mg Intramuscular Q28 days Kamyia Thomason, Ileene Hutchinson, MD   400 mg at 04/10/18 1438  . hydrOXYzine (ATARAX/VISTARIL) tablet 50 mg  50 mg Oral TID PRN Clapacs, Jackquline Denmark, MD   50 mg at 04/10/18 2230  . magnesium hydroxide (MILK OF MAGNESIA) suspension 30 mL  30 mL Oral Daily PRN Clapacs, John T, MD      . traZODone (DESYREL) tablet 150 mg  150 mg Oral QHS PRN Marely Apgar, Ileene Hutchinson, MD        Lab Results: No results  found for this or any previous visit (from the past 48 hour(s)).  Blood Alcohol level:  Lab Results  Component Value Date   ETH <10 04/08/2018   ETH <10 11/15/2017    Metabolic Disorder Labs: Lab Results  Component Value Date   HGBA1C 5.3 04/08/2018   MPG 105.41 04/08/2018   No results found for: PROLACTIN Lab Results  Component Value Date   CHOL 150 04/08/2018   TRIG 126 04/08/2018   HDL 44 04/08/2018   CHOLHDL 3.4 04/08/2018   VLDL 25 04/08/2018   LDLCALC 81 04/08/2018    Physical Findings: AIMS:  , ,  ,  ,    CIWA:    COWS:     Musculoskeletal: Strength & Muscle Tone: within normal limits Gait & Station: normal Patient leans: N/A  Psychiatric Specialty Exam: Physical Exam  Nursing note and vitals reviewed.   Review of Systems  All other systems reviewed and are negative.   Blood pressure 110/69, pulse 81, temperature 97.8 F (36.6 C), temperature source Oral, resp. rate 18, height  (1.727 m), weight 90.7 kg (200 lb), SpO2 100 %.Body mass index is 30.41 kg/m.  General Appearance: Casual  Eye Contact:  Good  Speech:  Clear and Coherent  Volume:  Normal  Mood:  Euthymic  Affect:  Appropriate  Thought Process:  Coherent and Goal Directed  Orientation:  Full (Time, Place, and Person)  Thought Content:  Logical  Suicidal Thoughts:  No  Homicidal Thoughts:  No  Memory:  Immediate;   Fair  Judgement:  Fair  Insight:  Fair  Psychomotor Activity:  Normal  Concentration:  Concentration: Fair  Recall:  [employer] of Knowledge:  Fair  Language:  Fair  Akathisia:  No      Assets:  Resilience  ADL's:  Intact  Cognition:  WNL  Sleep:  Number of Hours: 7     Treatment Plan Summary: 42 yo male admitted due to [facility] and SI in the context of crack cocaine use. He was accepted into [facility] for treatment and is very excited. Mood has improved and SI has resolved.  Plan:  Schizophrenia by history -He was given Abilify  Maintenna 400 mg  yesterday -EKG QTc 407  Alcohol use disorder -He completed Librium taper, Vitals stable  Crack Cocaine use  -  He will go to [facility] -Discharge to [facility] tomorrow Haskell Riling, MD 04/11/2018, 2:37 PM

## 2018-04-11 NOTE — BHH Group Notes (Signed)
LCSW Group Therapy Note 04/11/2018 9:00 AM  Type of Therapy and Topic:  Group Therapy:  Setting Goals  Participation Level:  Did Not Attend  Description of Group: In this process group, patients discussed using strengths to work toward goals and address challenges.  Patients identified two positive things about themselves and one goal they were working on.  Patients were given the opportunity to share openly and support each other's plan for self-empowerment.  The group discussed the value of gratitude and were encouraged to have a daily reflection of positive characteristics or circumstances.  Patients were encouraged to identify a plan to utilize their strengths to work on current challenges and goals.  Therapeutic Goals 1. Patient will verbalize personal strengths/positive qualities and relate how these can assist with achieving desired personal goals 2. Patients will verbalize affirmation of peers plans for personal change and goal setting 3. Patients will explore the value of gratitude and positive focus as related to successful achievement of goals 4. Patients will verbalize a plan for regular reinforcement of personal positive qualities and circumstances.  Summary of Patient Progress:  Dominic [name] was invited to group, but chose not to attend.     Therapeutic Modalities Cognitive Behavioral Therapy Motivational Interviewing    Alease Frame, [place] 04/11/2018 3:14 PM

## 2018-04-11 NOTE — Progress Notes (Signed)
D- Patient alert and oriented. Patient presents in a pleasant mood on assessment stating that he woke up on and off last night, "that sleeping medication didn't work". Patient denies SI, HI, AVH, and pain at this time. Patient also denies any signs/symptoms of depression/anxiety at this time. Patient's goal for today for today is to "call [facility], and hope they accept me".  A- Scheduled medications administered to patient, per MD orders. Support and encouragement provided.  Routine safety checks conducted every 15 minutes.  Patient informed to notify staff with problems or concerns.  R- No adverse drug reactions noted. Patient contracts for safety at this time. Patient compliant with medications and treatment plan. Patient receptive, calm, and cooperative. Patient interacts well with others on the unit.  Patient remains safe at this time.

## 2018-04-11 NOTE — Progress Notes (Signed)
Patient ID: Dominic [name], male   DOB: 1976/06/08, 42 y.o.   MRN: [id number] Isolated to room, rough and disheveled appearance; pleasant on approach, "what brought me here? Drugs: Cocaine, Marijuana and alcohols; I am going to abuse treatment when I get out of here..." Patient denied SI/HI/AVH, PRNs: Trazodone and Vistaril given at bedtime.

## 2018-04-11 NOTE — Progress Notes (Signed)
Recreation Therapy Notes  Date: 04/11/2018  Time: 9:30 am  Location: Craft Room  Behavioral response: Appropriate  Intervention Topic: Communication  Discussion/Intervention:  Group content today was focused on communication. The group defined communication and ways to communicate with others. Individuals stated reason why communication is important and some reasons to communicate with others. Patients expressed if they thought they were good at communicating with others and ways they could improve their communication skills. The group identified important parts of communication and some experiences they have had in the past with communication. The group participated in the intervention "What is that?", where they had a chance to test out their communication skills and identify ways to improve their communication techniques.  Clinical Observations/Feedback:  Patient came to group and stated that communication is responding. Individual participated in the intervention and was social with peers and staff during group. He was pulled from group by Child psychotherapist and never returned to group. Eula Mazzola LRT/CTRS         [name] 04/11/2018 11:30 AM

## 2018-04-11 NOTE — Plan of Care (Signed)
Patient slept for Estimated Hours of 7; Precautionary checks every 15 minutes for safety maintained, room free of safety hazards, patient sustains no injury or falls during this shift.  Problem: Education: Goal: Emotional status will improve Outcome: Progressing   Problem: Coping: Goal: Ability to demonstrate self-control will improve Outcome: Progressing

## 2018-04-11 NOTE — BHH Group Notes (Signed)
LCSW Group Therapy Note  04/11/2018 1:00 pm  Type of Therapy/Topic:  Group Therapy:  Balance in Life  Participation Level:  Did Not Attend  Description of Group:    This group will address the concept of balance and how it feels and looks when one is unbalanced. Patients will be encouraged to process areas in their lives that are out of balance and identify reasons for remaining unbalanced. Facilitators will guide patients in utilizing problem-solving interventions to address and correct the stressor making their life unbalanced. Understanding and applying boundaries will be explored and addressed for obtaining and maintaining a balanced life. Patients will be encouraged to explore ways to assertively make their unbalanced needs known to significant others in their lives, using other group members and facilitator for support and feedback.  Therapeutic Goals: 1. Patient will identify two or more emotions or situations they have that consume much of in their lives. 2. Patient will identify signs/triggers that life has become out of balance:  3. Patient will identify two ways to set boundaries in order to achieve balance in their lives:  4. Patient will demonstrate ability to communicate their needs through discussion and/or role plays  Summary of Patient Progress:  Dominic [name] was invited to today's group, but chose not to attend.    Therapeutic Modalities:   Cognitive Behavioral Therapy Solution-Focused Therapy Assertiveness Training  Alease Frame, [place] 04/11/2018 3:16 PM

## 2018-04-12 MED ORDER — TRAZODONE HCL 150 MG PO TABS: 150.0000 mg | ORAL_TABLET | Freq: Every evening | ORAL | 0 refills | Status: DC | PRN

## 2018-04-12 NOTE — Progress Notes (Signed)
Patient ID: Precious BardCedric [name], male   DOB: 1975/12/15, 42 y.o.   MRN: [id number] Pleasant on approach, still disheveled, optimistic, hopeful, excited, [facility], determined to turn things around for good; denied SI/HI/AVH.

## 2018-04-12 NOTE — Plan of Care (Signed)
Patient slept for Estimated Hours of 6.30; Precautionary checks every 15 minutes for safety maintained, room free of safety hazards, patient sustains no injury or falls during this shift.  Problem: Education: Goal: Emotional status will improve Outcome: Progressing   Problem: Coping: Goal: Ability to verbalize frustrations and anger appropriately will improve Outcome: Progressing

## 2018-04-12 NOTE — Discharge Summary (Signed)
Physician Discharge Summary Note  Patient:  Dominic [name] is an 42 y.o., male MRN:  [id number] DOB:  1976-06-15 Patient phone:  [phone] (home)  Patient address:   [address] [place] [address],  Total Time spent with patient: 15 minutes  Plus 20 minutes of medication reconciliation, discharge planning, and discharge documentation   Date of Admission:  04/09/2018 Date of Discharge: 04/12/18  Reason for Admission:  Suicidal thoughts in the context of drug use  Principal Problem: Schizophrenia [facility]) Discharge Diagnoses: Patient Active Problem List   Diagnosis Date Noted  . Schizophrenia (HCC) [F20.9] 02/16/2016    Priority: High  . Alcohol abuse [F10.10] 02/16/2016    Priority: High  . Cocaine abuse (HCC) [F14.10] 02/16/2016    Priority: High  . 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 Bipolar 1 disorder (HCC)   . Schizophrenia [facility])     Past Surgical History:  Procedure Laterality Date  . CYST REMOVAL HAND     neck   Family History: History reviewed. No pertinent family history. Family Psychiatric  History: See H&P Social History:  Social History   Substance and Sexual Activity  Alcohol Use Yes  . Alcohol/week: 4.2 oz  . Types: 7 Shots of liquor per week   Comment: Pt states he drinks beer however states he is not sure how much he consumes     Social History   Substance and Sexual Activity  Drug Use Yes  . Types: Cocaine, Marijuana    Social History   Socioeconomic History  . Marital status: Single    Spouse name: Not on file  . Number of children: Not on file  . Years of education: Not on file  . Highest education level: Not on file  Occupational History  . Not on file  Social Needs  . Financial resource strain: Not on file  . Food insecurity:    Worry: Not on file    Inability: Not on file  . Transportation needs:    Medical: Not on  file    Non-medical: Not on file  Tobacco Use  . Smoking status: Current Every Day Smoker    Packs/day: 1.00    Types: Cigarettes  . Smokeless tobacco: Never Used  Substance and Sexual Activity  . Alcohol use: Yes    Alcohol/week: 4.2 oz    Types: 7 Shots of liquor per week    Comment: Pt states he drinks beer however states he is not sure how much he consumes  . Drug use: Yes    Types: Cocaine, Marijuana  . Sexual activity: Not on file  Lifestyle  . Physical activity:    Days per week: Not on file    Minutes per session: Not on file  . Stress: Not on file  Relationships  . Social connections:    Talks on phone: Not on file    Gets together: Not on file    Attends religious service: Not on file    Active member of club or organization: Not on file    Attends meetings of clubs or organizations: Not on file    Relationship status: Not on file  Other Topics Concern  . Not on file  Social History Narrative  . Not on file    Hospital Course:  Pt was supposed to get Aristad injection on 5/22. However, [name] did not have in stock. I confirmed  this with [name]. We did not have Arista on stock here so was given Abilify Maintenna 400 mg on 5/29. He felt this medication was very helpful for him. He had phone interview with [facility] and was accepted. After this, he reported improvement in mood and was much more hopeful. On day of discharge, he stated that the voices were completely gone. He was hopeful and happy. He is excited to work at [employer]. He denied SI or any thoughts of self harm. Denied HI and VH. He remained organized and goal directed during hospitalization. He is looking forward to sobriety and hopes to get his kids back someday.   The patient is at low risk of imminent suicide. Patient denied thoughts, intent, or plan for harm to self or others, expressed significant future orientation, and expressed an ability to mobilize assistance for his needs. He is  presently void of any contributing psychiatric symptoms, cognitive difficulties, or substance use which would elevate his risk for lethality. Chronic risk for lethality is elevated in light of male gender, substance abuse. The chronic risk is presently mitigated by his ongoing desire and engagement in [facility] treatment and mobilization of support from family and friends. Chronic risk may elevate if he experiences any significant loss or worsening of symptoms, which can be managed and monitored through outpatient providers. At this time,a cute risk for lethality is low and he is stable for ongoing outpatient management.   Modifiable risk factors were addressed during this hospitalization through appropriate pharmacotherapy and establishment of outpatient follow-up treatment. Some risk factors for suicide are situational (i.e. Unstable housing) or related personality pathology (i.e. Poor coping mechanisms) and thus cannot be further mitigated by continued hospitalization in this setting.   The patient is at low risk of imminent violence to others. Patient denied any thoughts, intent, plan for harm to others. Additionally, the pt does not appear manic, psychotic, or depressed.  IN discussion, he/she does show an ability to appreciate the nature of his/her actions, the consequences,of his behaviors, and the resultant legal ramifications.       Physical Findings: AIMS:  , ,  ,  ,    CIWA:    COWS:     Musculoskeletal: Strength & Muscle Tone: within normal limits Gait & Station: normal Patient leans: N/A  Psychiatric Specialty Exam: Physical Exam  ROS  Blood pressure 134/72, pulse 76, temperature 97.7 F (36.5 C), temperature source Oral, resp. rate 16, height  (1.727 m), weight 90.7 kg (200 lb), SpO2 99 %.Body mass index is 30.41 kg/m.  General Appearance: Casual  Eye Contact:  Good  Speech:  Clear  Volume:  Normal  Mood:  Euthymic  Affect:  Appropriate  Thought Process:  Coherent and Goal  Directed  Orientation:  Full (Time, Place, and Person)  Thought Content:  Logical  Suicidal Thoughts:  No  Homicidal Thoughts:  No  Memory:  Immediate;   Fair  Judgement:  Fair  Insight:  Fair  Psychomotor Activity:  Normal  Concentration:  Concentration: Fair  Recall:  [employer] of Knowledge:  Fair  Language:  Fair  Akathisia:  No      Assets:  Resilience  ADL's:  Intact  Cognition:  WNL  Sleep:  Number of Hours: 6.3     Have you used any form of tobacco in the last 30 days? (Cigarettes, Smokeless Tobacco, Cigars, and/or Pipes): Yes  Has this patient used any form of tobacco in the last 30 days? (Cigarettes,  Smokeless Tobacco, Cigars, and/or Pipes) Yes, Yes, A prescription for an FDA-approved tobacco cessation medication was offered at discharge and the patient refused  Blood Alcohol level:  Lab Results  Component Value Date   [facility] <10 04/08/2018   ETH <10 11/15/2017    Metabolic Disorder Labs:  Lab Results  Component Value Date   HGBA1C 5.3 04/08/2018   MPG 105.41 04/08/2018   No results found for: PROLACTIN Lab Results  Component Value Date   CHOL 150 04/08/2018   TRIG 126 04/08/2018   HDL 44 04/08/2018   CHOLHDL 3.4 04/08/2018   VLDL 25 04/08/2018   LDLCALC 81 04/08/2018    See Psychiatric Specialty Exam and Suicide Risk Assessment completed by Attending Physician prior to discharge.  Discharge destination:  Other:  [facility]  Is patient on multiple antipsychotic therapies at discharge:  No   Has Patient had three or more failed trials of antipsychotic monotherapy by history:  No  Recommended Plan for Multiple Antipsychotic Therapies: NA  Discharge Instructions    Increase activity slowly   Complete by:  As directed      Allergies as of 04/12/2018   No Known Allergies     Medication List    STOP taking these medications   azithromycin 250 MG tablet Commonly known as:  ZITHROMAX Z-PAK   predniSONE 50 MG tablet Commonly known as:   DELTASONE     TAKE these medications     Indication  albuterol 108 (90 Base) MCG/ACT inhaler Commonly known as:  PROVENTIL HFA;VENTOLIN HFA Inhale 2 puffs into the lungs every 6 (six) hours as needed for wheezing or shortness of breath.  Indication:  Asthma   ARIPiprazole ER 400 MG Srer injection Commonly known as:  ABILIFY MAINTENA Inject 2 mLs (400 mg total) into the muscle every 28 (twenty-eight) days. Next injection due 05/08/18 Start taking on:  05/08/2018  Indication:  Schizophrenia   traZODone 150 MG tablet Commonly known as:  DESYREL Take 1 tablet (150 mg total) by mouth at bedtime as needed for sleep.  Indication:  Trouble Sleeping      Follow-up Information    [facility]. Go on 04/12/2018.   Why:  Please go to [facility] for intake into their program at discharge. Thank you. Contact information: Address: [address], [place], [address] [phone] [phone]          Follow-up recommendations: [facility]  Signed: Haskell Riling, MD 04/12/2018, 8:23 AM

## 2018-04-12 NOTE — Progress Notes (Signed)
Patient ID: Dominic [name], male   DOB: January 31, 1976, 42 y.o.   MRN: [id number]   Discharge Note:  Patient denies SI/HI/AVH at this time. Discharge instructions, AVS, transition record, and prescriptions gone over with patient. Patient agrees to comply with medication management, follow-up visit, and outpatient therapy. Patient belongings returned to patient. Patient questions and concerns addressed and answered. Patient ambulatory off unit. Patient discharged to bus stop via courtesy car.

## 2018-04-12 NOTE — Progress Notes (Signed)
Recreation Therapy Notes  Date: 04/12/2018  Time: 9:30 am  Location: Craft Room  Behavioral response: Appropriate  Intervention Topic:  Leisure  Discussion/Intervention:  Group content today was focused on leisure. The group defined what leisure is and some positive leisure activities they participate in. Individuals identified the difference between good and bad leisure. Participants expressed how they feel after participating in the leisure of their choice. The group discussed how they go about picking a leisure activity and if others are involved in their leisure activities. The patient stated how many leisure activities they too choose from and reasons why it is important to have leisure time. Individuals participated in the intervention "Leisure Jeopardy" where they had a chance to identify new leisure activities as well as benefits of leisure. Clinical Observations/Feedback:  Patient came to group late due to unknown reason. Individual participated in the intervention and was social with peers and staff during group.Patient was pulled from group by nurse and never returned to group. Kaityln Kallstrom LRT/CTRS         Sergio Hobart 04/12/2018 12:13 PM

## 2018-04-12 NOTE — Progress Notes (Signed)
  [facility] Adult Case Management Discharge Plan :  Will you be returning to the same living situation after discharge:  No. At discharge, do you have transportation home?: Yes,  Taxi to [facility] Do you have the ability to pay for your medications: Yes,  Insurance  Release of information consent forms completed and in the chart;  Patient's signature needed at discharge.  Patient to Follow up at: Follow-up Information    [facility]. Go on 04/12/2018.   Why:  Please go to [facility] for intake into their program at discharge. Thank you. Contact information: Address: [address] [place], [address] [phone] Fax:[phone]          Next level of care provider has access to [facility] Link:no  Safety Planning and Suicide Prevention discussed: Yes,  Romie Jumper, Pts. Sistser, [phone]   Have you used any form of tobacco in the last 30 days? (Cigarettes, Smokeless Tobacco, Cigars, and/or Pipes): Yes  Has patient been referred to the Quitline?: Patient refused referral  Patient has been referred for addiction treatment: Yes  Johny Shears, LCSW 04/12/2018, 8:18 AM

## 2018-04-12 NOTE — Progress Notes (Signed)
Recreation Therapy Notes  INPATIENT RECREATION TR PLAN  Patient Details Name: Dominic [name] MRN: [id number] DOB: [birth year] Today's Date: 04/12/2018  Rec Therapy Plan Is patient appropriate for Therapeutic Recreation?: Yes Treatment times per week: at least 3 Estimated Length of Stay: 5-7 days TR Treatment/Interventions: Group participation (Comment)  Discharge Criteria Pt will be discharged from therapy if:: Discharged Treatment plan/goals/alternatives discussed and agreed upon by:: Patient/family  Discharge Summary Short term goals set: Patient will identify 3 resources in their community to aid in recovery within 5 recreation therapy group sessions Short term goals met: Adequate for discharge Progress toward goals comments: Groups attended Which groups?: Communication, Leisure education Reason goals not met: N/A Therapeutic equipment acquired: N/A Reason patient discharged from therapy: Discharge from hospital Pt/family agrees with progress & goals achieved: Yes Date patient discharged from therapy: 04/12/18   [name] 04/12/2018, 3:28 PM

## 2018-04-12 NOTE — [facility] Suicide Risk Assessment (Signed)
[facility]   Principal Problem: Schizophrenia [facility](HCC) Discharge Diagnoses:  Patient Active Problem List   Diagnosis Date Noted  . Schizophrenia (HCC) [F20.9] 02/16/2016    Priority: High  . Alcohol abuse [F10.10] 02/16/2016    Priority: High  . Cocaine abuse (HCC) [F14.10] 02/16/2016    Priority: High  . 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  Patients presenting with no risk factors but with morbid ruminations; may be classified as minimal risk based on the severity of the depressive symptoms  Follow-up Information    [facility].   Why:  [facility] into their program at discharge. Thank you. Contact information: Address: [address], [place], [id number] [phone] Fax:[phone]          Plan Of Care/Follow-up recommendations: [facility], will follow up with [facility]  Holly R McNew, MD 04/12/2018, 8:23 AM

## 2018-04-25 ENCOUNTER — Ambulatory Visit (HOSPITAL_COMMUNITY)
Admission: EM | Admit: 2018-04-25 | Discharge: 2018-04-25 | Disposition: A | Discharge status: Home / Self Care | Payer: Medicaid Other | Attending: Family Medicine | Admitting: Family Medicine

## 2018-04-25 ENCOUNTER — Encounter (HOSPITAL_COMMUNITY): Payer: Self-pay | Admitting: Emergency Medicine

## 2018-04-25 DIAGNOSIS — J4520 Mild intermittent asthma, uncomplicated: Secondary | ICD-10-CM | POA: Diagnosis not present

## 2018-04-25 MED ORDER — ALBUTEROL SULFATE HFA 108 (90 BASE) MCG/ACT IN AERS: 2.0000 | INHALATION_SPRAY | Freq: Four times a day (QID) | RESPIRATORY_TRACT | 2 refills | Status: DC | PRN

## 2018-04-25 NOTE — ED Triage Notes (Signed)
Pt states his inhaler ran out two days ago and his asthma has been flairing up, needs refill.

## 2018-04-25 NOTE — ED Provider Notes (Addendum)
[facility]   [id number] 04/25/18 Arrival Time: 1756  ASSESSMENT & PLAN:  1. Mild intermittent asthma, unspecified whether complicated     Meds ordered this encounter  Medications  . albuterol (PROVENTIL HFA;VENTOLIN HFA) 108 (90 Base) MCG/ACT inhaler    Sig: Inhale 2 puffs into the lungs every 6 (six) hours as needed for wheezing or shortness of breath.    Dispense:  1 Inhaler    Refill:  2   Refilled inhaler. Asthma precautions given. OTC symptom care as needed. May f/u with PCP or here as needed.  Reviewed expectations re: course of current medical issues. Questions answered. Outlined signs and symptoms indicating need for more acute intervention. Patient verbalized understanding. After Visit Summary given.  SUBJECTIVE: History from: patient.  Dominic [name] is a 42 y.o. male who presents with complaint of intermittent wheezing. Triggers: None. Onset gradual, approximately a few days ago. Describes wheezing as mild when present. Fever: no. Overall normal PO intake without n/v. Sick contacts: no. Typically his asthma is well controlled. Inhaler use: out of inhaler; requests refills.. OTC treatment: none.   Social History   Tobacco Use  Smoking Status Current Every Day Smoker  . Packs/day: 1.00  . Types: Cigarettes  Smokeless Tobacco Never Used    ROS: As per HPI.   OBJECTIVE:  Vitals:   04/25/18 1829  BP: 139/80  Pulse: 84  Resp: (!) 22  Temp: 97.8 F (36.6 C)  SpO2: 96%    Recheck RR: 18 General appearance: alert; appears fatigued HEENT: nasal congestion; clear runny nose; throat irritation secondary to post-nasal drainage Neck: supple without LAD Lungs: unlabored respirations, mild bilateral wheezing; cough: absent; no significant respiratory distress Skin: warm and dry Psychological: alert and cooperative; normal mood and affect    No Known Allergies  Past Medical History:  Diagnosis Date  . Bipolar 1 disorder (HCC)   .  Schizophrenia (HCC)    No family history on file. Social History   Socioeconomic History  . Marital status: Single    Spouse name: Not on file  . Number of children: Not on file  . Years of education: Not on file  . Highest education level: Not on file  Occupational History  . Not on file  Social Needs  . Financial resource strain: Not on file  . Food insecurity:    Worry: Not on file    Inability: Not on file  . Transportation needs:    Medical: Not on file    Non-medical: Not on file  Tobacco Use  . Smoking status: Current Every Day Smoker    Packs/day: 1.00    Types: Cigarettes  . Smokeless tobacco: Never Used  Substance and Sexual Activity  . Alcohol use: Yes    Alcohol/week: 4.2 oz    Types: 7 Shots of liquor per week    Comment: Pt states he drinks beer however states he is not sure how much he consumes  . Drug use: Yes    Types: Cocaine, Marijuana  . Sexual activity: Not on file  Lifestyle  . Physical activity:    Days per week: Not on file    Minutes per session: Not on file  . Stress: Not on file  Relationships  . Social connections:    Talks on phone: Not on file    Gets together: Not on file    Attends religious service: Not on file    Active member of club or organization: Not on file  Attends meetings of clubs or organizations: Not on file    Relationship status: Not on file  . Intimate partner violence:    Fear of current or ex partner: Not on file    Emotionally abused: Not on file    Physically abused: Not on file    Forced sexual activity: Not on file  Other Topics Concern  . Not on file  Social History Narrative  . Not on file            Mardella LaymanHagler, Elzie Sheets, MD 05/22/18 [id number]    Mardella LaymanHagler, Honestii Marton, MD 05/22/18 [phone]

## 2018-05-11 ENCOUNTER — Encounter (HOSPITAL_COMMUNITY): Payer: Self-pay | Admitting: *Deleted

## 2018-05-11 ENCOUNTER — Ambulatory Visit (HOSPITAL_COMMUNITY)
Admission: EM | Admit: 2018-05-11 | Discharge: 2018-05-11 | Disposition: A | Discharge status: Home / Self Care | Payer: Medicaid Other | Attending: Family Medicine | Admitting: Family Medicine

## 2018-05-11 DIAGNOSIS — F1911 Other psychoactive substance abuse, in remission: Secondary | ICD-10-CM | POA: Diagnosis not present

## 2018-05-11 DIAGNOSIS — F17210 Nicotine dependence, cigarettes, uncomplicated: Secondary | ICD-10-CM | POA: Insufficient documentation

## 2018-05-11 DIAGNOSIS — F209 Schizophrenia, unspecified: Secondary | ICD-10-CM | POA: Diagnosis not present

## 2018-05-11 DIAGNOSIS — R109 Unspecified abdominal pain: Secondary | ICD-10-CM | POA: Diagnosis present

## 2018-05-11 DIAGNOSIS — R1084 Generalized abdominal pain: Secondary | ICD-10-CM | POA: Diagnosis not present

## 2018-05-11 DIAGNOSIS — F319 Bipolar disorder, unspecified: Secondary | ICD-10-CM | POA: Insufficient documentation

## 2018-05-11 DIAGNOSIS — F1010 Alcohol abuse, uncomplicated: Secondary | ICD-10-CM | POA: Insufficient documentation

## 2018-05-11 DIAGNOSIS — M549 Dorsalgia, unspecified: Secondary | ICD-10-CM | POA: Diagnosis present

## 2018-05-11 LAB — COMPREHENSIVE METABOLIC PANEL
ALT: 55 U/L — AB (ref 0–44)
ANION GAP: 8 (ref 5–15)
AST: 55 U/L — ABNORMAL HIGH (ref 15–41)
Albumin: 3.7 g/dL (ref 3.5–5.0)
Alkaline Phosphatase: 42 U/L (ref 38–126)
BUN: 9 mg/dL (ref 6–20)
CHLORIDE: 108 mmol/L (ref 98–111)
CO2: 24 mmol/L (ref 22–32)
Calcium: 9.2 mg/dL (ref 8.9–10.3)
Creatinine, Ser: 0.9 mg/dL (ref 0.61–1.24)
GFR calc non Af Amer: >60 mL/min (ref >60)
Glucose, Bld: 126 mg/dL — ABNORMAL HIGH (ref 70–99)
Potassium: 4 mmol/L (ref 3.5–5.1)
SODIUM: 140 mmol/L (ref 135–145)
Total Bilirubin: 0.7 mg/dL (ref 0.3–1.2)
Total Protein: 6.9 g/dL (ref 6.5–8.1)

## 2018-05-11 LAB — POCT I-STAT, CHEM 8
BUN: 11 mg/dL (ref 6–20)
CHLORIDE: 107 mmol/L (ref 98–111)
Calcium, Ion: 1.2 mmol/L (ref 1.15–1.40)
Creatinine, Ser: 0.9 mg/dL (ref 0.61–1.24)
Glucose, Bld: 132 mg/dL — ABNORMAL HIGH (ref 70–99)
HEMATOCRIT: 43 % (ref 39.0–52.0)
Hemoglobin: 14.6 g/dL (ref 13.0–17.0)
POTASSIUM: 3.8 mmol/L (ref 3.5–5.1)
SODIUM: 141 mmol/L (ref 135–145)
TCO2: 23 mmol/L (ref 22–32)

## 2018-05-11 LAB — CBC
HCT: 39.3 % (ref 39.0–52.0)
Hemoglobin: 14.2 g/dL (ref 13.0–17.0)
MCH: 30.4 pg (ref 26.0–34.0)
MCHC: 36.1 g/dL — AB (ref 30.0–36.0)
MCV: 84.2 fL (ref 78.0–100.0)
PLATELETS: 205 10*3/uL (ref 150–400)
RBC: 4.67 MIL/uL (ref 4.22–5.81)
RDW: 14.3 % (ref 11.5–15.5)
WBC: 8.1 10*3/uL (ref 4.0–10.5)

## 2018-05-11 LAB — POCT URINALYSIS DIP (DEVICE)
Bilirubin Urine: NEGATIVE
GLUCOSE, UA: NEGATIVE mg/dL
Hgb urine dipstick: NEGATIVE
Ketones, ur: NEGATIVE mg/dL
Nitrite: NEGATIVE
PROTEIN: NEGATIVE mg/dL
SPECIFIC GRAVITY, URINE: 1.025 (ref 1.005–1.030)
UROBILINOGEN UA: 1 mg/dL (ref 0.0–1.0)
pH: 6 (ref 5.0–8.0)

## 2018-05-11 LAB — LIPASE, BLOOD: Lipase: 25 U/L (ref 11–51)

## 2018-05-11 MED ORDER — IBUPROFEN 600 MG PO TABS: 600.0000 mg | ORAL_TABLET | Freq: Four times a day (QID) | ORAL | 0 refills | Status: DC | PRN

## 2018-05-11 NOTE — Discharge Instructions (Addendum)
Please use ibuprofen for pain  We will call about lab results if significantly abnormal  Please go to emergency room if developing worsening pain, nausea, vomiting, fever, overnight  Please work on getting set up with a primary care, please use [facility] health [facility] wellness

## 2018-05-11 NOTE — ED Triage Notes (Addendum)
Reports working at [employer] [facility] when he started with bilat side and low back pain.  States was told he has Hep C from donating blood, but has never seen a dr about Hep C.

## 2018-05-12 LAB — HEPATITIS PANEL, ACUTE
HCV Ab: >11 s/co ratio — ABNORMAL HIGH (ref 0.0–0.9)
Hep A IgM: NEGATIVE
Hep B C IgM: NEGATIVE
Hepatitis B Surface Ag: NEGATIVE
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
## 2018-05-13 ENCOUNTER — Telehealth (HOSPITAL_COMMUNITY): Payer: Self-pay

## 2018-05-13 NOTE — Telephone Encounter (Signed)
Hepatitis C is positive. Attempted to reach patient to go over instructions per [facility], no answer and no voicemail available.  [facility] notified.

## 2018-05-14 ENCOUNTER — Telehealth (HOSPITAL_COMMUNITY): Payer: Self-pay

## 2018-05-14 ENCOUNTER — Telehealth: Payer: Self-pay | Admitting: *Deleted

## 2018-05-14 ENCOUNTER — Encounter (HOSPITAL_COMMUNITY): Payer: Self-pay | Admitting: Emergency Medicine

## 2018-05-14 ENCOUNTER — Emergency Department (HOSPITAL_COMMUNITY)
Admission: EM | Admit: 2018-05-14 | Discharge: 2018-05-14 | Disposition: A | Discharge status: Home / Self Care | Payer: Medicaid Other | Attending: Emergency Medicine | Admitting: Emergency Medicine

## 2018-05-14 ENCOUNTER — Other Ambulatory Visit: Payer: Self-pay

## 2018-05-14 DIAGNOSIS — Z79899 Other long term (current) drug therapy: Secondary | ICD-10-CM | POA: Insufficient documentation

## 2018-05-14 DIAGNOSIS — M545 Low back pain, unspecified: Secondary | ICD-10-CM

## 2018-05-14 DIAGNOSIS — B1710 Acute hepatitis C without hepatic coma: Secondary | ICD-10-CM | POA: Insufficient documentation

## 2018-05-14 DIAGNOSIS — F17210 Nicotine dependence, cigarettes, uncomplicated: Secondary | ICD-10-CM | POA: Diagnosis not present

## 2018-05-14 DIAGNOSIS — B1920 Unspecified viral hepatitis C without hepatic coma: Secondary | ICD-10-CM

## 2018-05-14 HISTORY — DX: Unspecified viral hepatitis C without hepatic coma: B19.20

## 2018-05-14 MED ORDER — METHOCARBAMOL 500 MG PO TABS: 500.0000 mg | ORAL_TABLET | Freq: Two times a day (BID) | ORAL | 0 refills | Status: DC

## 2018-05-14 MED ORDER — IBUPROFEN 600 MG PO TABS: 600.0000 mg | ORAL_TABLET | Freq: Four times a day (QID) | ORAL | 0 refills | Status: DC | PRN

## 2018-05-14 NOTE — Telephone Encounter (Signed)
Patient called to follow up on Hepatitis C testing in ED, wants to make an appointment for treatment.  [facility], will be there for 8 months. He requests a call to  [facility] [phone] for scheduling. Andree CossHowell, Michelle M, RN

## 2018-05-14 NOTE — ED Notes (Signed)
ED Provider at bedside. 

## 2018-05-14 NOTE — ED Triage Notes (Signed)
Pt. Stated, [facility] and I have Hepatitis C but I was not given anything for pain. Im having stomach and back pain.

## 2018-05-14 NOTE — Discharge Instructions (Addendum)
Please read attached information. If you experience any new or worsening signs or symptoms please return to the emergency room for evaluation. Please follow-up with your primary care provider or specialist as discussed. Please use medication prescribed only as directed and discontinue taking if you have any concerning signs or symptoms.   °

## 2018-05-14 NOTE — ED Provider Notes (Signed)
[facility]   CSN: [id number] Arrival date & time: 05/14/18  1146     History   Chief Complaint Chief Complaint  Patient presents with  . Abdominal Pain  . Back Pain    HPI Dominic [name] is a 42 y.o. male.  HPI   42 year old male presents today with complaints of back pain. 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 He notes this radiates around to the abdomen bilaterally, denies any loss of distal sensation strength or motor function, denies any fever, or any other red flags. Patient notes that he was given ibuprofen which did not improve his symptoms. Patient also is concerned that he has been recently diagnosed with hepatitis C.  Past Medical History:  Diagnosis Date  . Bipolar 1 disorder (HCC)   . Hepatitis C   . Schizophrenia [facility](HCC)     Patient Active Problem List   Diagnosis Date Noted  . Schizophrenia (HCC) 02/16/2016  . 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 Alcohol abuse 02/16/2016  . Cocaine abuse (HCC) 02/16/2016    Past Surgical History:  Procedure Laterality Date  . CYST REMOVAL NECK     neck        Home Medications    Prior to Admission medications   Medication Sig Start Date End Date Taking? Authorizing Provider  ARIPiprazole ER (ABILIFY MAINTENA) 400 MG SRER injection Inject 2 mLs (400 mg total) into the muscle every 28 (twenty-eight) days. Next injection due 05/08/18 05/08/18   McNew, Ileene HutchinsonHolly R, MD  ibuprofen (ADVIL,MOTRIN) 600 MG tablet Take 1 tablet (600 mg total) by mouth every 6 (six) hours as needed. 05/14/18   Kenslei Hearty, Tinnie GensJeffrey, PA-C  methocarbamol (ROBAXIN) 500 MG tablet Take 1 tablet (500 mg total) by mouth 2 (two) times daily. 05/14/18   Eyvonne MechanicHedges, Brianda Beitler, PA-C    Family History No family history on file.  Social History Social History   Tobacco Use  . Smoking status:  Current Some Day Smoker    Packs/day: 1.00    Types: Cigarettes  . Smokeless tobacco: Never Used  Substance Use Topics  . Alcohol use: Not Currently    Alcohol/week: 4.2 oz    Types: 7 Shots of liquor per week  . Drug use: Not Currently    Types: Cocaine, Marijuana     Allergies   Patient has no known allergies.   Review of Systems Review of Systems  All other systems reviewed and are negative.    Physical Exam Updated Vital Signs BP 131/78   Pulse 91   Temp 98.9 F (37.2 C) (Oral)   Resp 17   Ht 5\' 10"  (1.778 m)   Wt 107 kg (236 lb)   SpO2 97%   BMI 33.86 kg/m   Physical Exam  Constitutional: He is oriented to person, place, and time. He appears well-developed and well-nourished.  HENT:  Head: Normocephalic and atraumatic.  Eyes: Pupils are equal, round, and reactive to light. Conjunctivae are normal. Right eye exhibits no discharge. Left eye exhibits no discharge. No scleral icterus.  Neck: Normal range of motion. No JVD present. No tracheal deviation present.  Pulmonary/Chest: Effort normal. No stridor.  Abdominal: Soft. He exhibits no distension and no mass. There is no tenderness. There is no rebound and no guarding. No hernia.  Musculoskeletal:  Tenderness to palpation of bilateral lower lumbar  musculature and soft tissue, no redness or swelling, no warmth to touch, no significant CT or L-spine tenderness to palpation  Neurological: He is alert and oriented to person, place, and time. Coordination normal.  Psychiatric: He has a normal mood and affect. His behavior is normal. Judgment and thought content normal.  Nursing note and vitals reviewed.   ED Treatments / Results  Labs (all labs ordered are listed, but only abnormal results are displayed) Labs Reviewed - No data to display  EKG None  Radiology No results found.  Procedures Procedures (including critical care time)  Medications Ordered in ED Medications - No data to display   Initial  Impression / Assessment and Plan / ED Course  I have reviewed the triage vital signs and the nursing notes.  Pertinent labs & imaging results that were available during my care of the patient were reviewed by me and considered in my medical decision making (see chart for details).     Labs:   Imaging:  Consults:  Therapeutics:  Discharge Meds: Robaxin, ibuprofen  Assessment/Plan: 42 year old male presents today with back pain likely muscular in nature, no red flags, no indication for imaging at this time. Symptomatic care given with anti-inflammatories and muscle relaxers. Patient with also recent HCV AB+ test, I discussed what this means and the need for additional testing to evaluate for acute infection. Patient will follow-up with infectious disease.    Final Clinical Impressions(s) / ED Diagnoses   Final diagnoses:  Acute bilateral low back pain without sciatica  Hepatitis C virus infection without hepatic coma, unspecified chronicity    ED Discharge Orders        Ordered    methocarbamol (ROBAXIN) 500 MG tablet  2 times daily     05/14/18 1356    ibuprofen (ADVIL,MOTRIN) 600 MG tablet  Every 6 hours PRN     05/14/18 1356       Eyvonne Mechanic, PA-C 05/14/18 1357    Vanetta Mulders, MD 05/16/18 [phone]

## 2018-05-14 NOTE — Telephone Encounter (Signed)
Pt returned call regarding test results. Aware of positive Hepatitis C. Reports worsening abdominal pain. [facility]

## 2018-05-25 ENCOUNTER — Emergency Department (HOSPITAL_COMMUNITY)
Admission: EM | Admit: 2018-05-25 | Discharge: 2018-05-25 | Disposition: A | Discharge status: Home / Self Care | Payer: Medicaid Other | Attending: Emergency Medicine | Admitting: Emergency Medicine

## 2018-05-25 ENCOUNTER — Emergency Department (HOSPITAL_COMMUNITY): Payer: Medicaid Other

## 2018-05-25 ENCOUNTER — Encounter (HOSPITAL_COMMUNITY): Payer: Self-pay | Admitting: Emergency Medicine

## 2018-05-25 DIAGNOSIS — Z79899 Other long term (current) drug therapy: Secondary | ICD-10-CM | POA: Insufficient documentation

## 2018-05-25 DIAGNOSIS — F17210 Nicotine dependence, cigarettes, uncomplicated: Secondary | ICD-10-CM | POA: Insufficient documentation

## 2018-05-25 DIAGNOSIS — Z760 Encounter for issue of repeat prescription: Secondary | ICD-10-CM

## 2018-05-25 DIAGNOSIS — R0789 Other chest pain: Secondary | ICD-10-CM | POA: Diagnosis present

## 2018-05-25 LAB — I-STAT TROPONIN, ED: TROPONIN I, POC: 0 ng/mL (ref 0.00–0.08)

## 2018-05-25 LAB — BASIC METABOLIC PANEL
Anion gap: 9 (ref 5–15)
BUN: 10 mg/dL (ref 6–20)
CALCIUM: 8.8 mg/dL — AB (ref 8.9–10.3)
CHLORIDE: 104 mmol/L (ref 98–111)
CO2: 23 mmol/L (ref 22–32)
CREATININE: 0.84 mg/dL (ref 0.61–1.24)
GFR calc Af Amer: >60 mL/min (ref >60)
GFR calc non Af Amer: >60 mL/min (ref >60)
Glucose, Bld: 239 mg/dL — ABNORMAL HIGH (ref 70–99)
Potassium: 3.4 mmol/L — ABNORMAL LOW (ref 3.5–5.1)
Sodium: 136 mmol/L (ref 135–145)

## 2018-05-25 LAB — CBC
HCT: 38.8 % — ABNORMAL LOW (ref 39.0–52.0)
Hemoglobin: 13.6 g/dL (ref 13.0–17.0)
MCH: 29.8 pg (ref 26.0–34.0)
MCHC: 35.1 g/dL (ref 30.0–36.0)
MCV: 85.1 fL (ref 78.0–100.0)
PLATELETS: 179 10*3/uL (ref 150–400)
RBC: 4.56 MIL/uL (ref 4.22–5.81)
RDW: 14.6 % (ref 11.5–15.5)
WBC: 7.8 10*3/uL (ref 4.0–10.5)

## 2018-05-25 MED ORDER — ALBUTEROL SULFATE HFA 108 (90 BASE) MCG/ACT IN AERS: 1.0000 | INHALATION_SPRAY | Freq: Four times a day (QID) | RESPIRATORY_TRACT | 0 refills | Status: DC | PRN

## 2018-05-25 MED ORDER — ALBUTEROL SULFATE HFA 108 (90 BASE) MCG/ACT IN AERS
2.0000 | INHALATION_SPRAY | Freq: Once | RESPIRATORY_TRACT | Status: AC
  Administered 2018-05-25: 2 via RESPIRATORY_TRACT
  Filled 2018-05-25: qty 6.7

## 2018-05-25 MED ORDER — IPRATROPIUM-ALBUTEROL 0.5-2.5 (3) MG/3ML IN SOLN
3.0000 mL | Freq: Once | RESPIRATORY_TRACT | Status: AC
  Administered 2018-05-25: 3 mL via RESPIRATORY_TRACT
  Filled 2018-05-25: qty 3

## 2018-05-25 MED ORDER — LIDOCAINE 5 % EX PTCH
1.0000 | MEDICATED_PATCH | CUTANEOUS | Status: DC
  Administered 2018-05-25: 1 via TRANSDERMAL
  Filled 2018-05-25: qty 1

## 2018-05-25 MED ORDER — ACETAMINOPHEN 500 MG PO TABS
1000.0000 mg | ORAL_TABLET | Freq: Once | ORAL | Status: AC
  Administered 2018-05-25: 1000 mg via ORAL
  Filled 2018-05-25: qty 2

## 2018-05-25 NOTE — Medical Student Note (Signed)
[facility]-EMERGENCY DEPT Provider Student Note For educational purposes for Medical, PA and NP students only and not part of the legal medical record.   CSN: [id number] Arrival date & time: 05/25/18  1914     History   Chief Complaint Chief Complaint  Patient presents with  . Chest Pain    HPI Dominic [name] is a 42 y.o.African American male  with a history of asthma,Hepatitis C, schizophrenia, Bipolar I and substance abuse presents today with chest pain x2 days. He was catching heavy bags of cat food on Thursday at work and woke up Friday morning with chest pain that was localized to the right mid chest wall. The pain has remained the same over the last two days. He describes the pain as sharp, worsened by bending over and movement. Pain is relieved by laying still. He has not attempted to take any medication or supportive measures for his pain. The pain does not radiate, and he rates it a 7.5/10 on the pain scale. He also states that he has been coughing since this morning. He has as history of asthma and ran out of his inhaler last week. Pertinent negatives include  no back/shoulder pain, no diaphoresis, no dizziness, no exertional chest pressure, no hemoptysis, no leg pain, no lower extremity edema, no nausea, no near-syncope, no syncope, vomiting, weakness,  and no palpitations.   HPI  Past Medical History:  Diagnosis Date  . Bipolar 1 disorder (HCC)   . Hepatitis C   . Schizophrenia [facility])     Patient Active Problem List   Diagnosis Date Noted  . Schizophrenia (HCC) 02/16/2016  . 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 Alcohol abuse 02/16/2016  . Cocaine abuse (HCC) 02/16/2016    Past Surgical History:  Procedure Laterality Date  . CYST REMOVAL NECK     neck       Home Medications    Prior to Admission medications   Medication Sig Start Date End Date Taking? Authorizing Provider  ibuprofen (ADVIL,MOTRIN) 600 MG tablet Take 1 tablet (600 mg total) by mouth every 6  (six) hours as needed. 05/14/18  Yes Hedges, Tinnie Gens, PA-C  methocarbamol (ROBAXIN) 500 MG tablet Take 1 tablet (500 mg total) by mouth 2 (two) times daily. 05/14/18  Yes Hedges, Tinnie Gens, PA-C  Multiple Vitamins-Minerals (MULTIVITAMIN WITH MINERALS) tablet Take 1 tablet by mouth daily.   Yes [provider]  traZODone (DESYREL) 50 MG tablet Take 50 mg by mouth at bedtime.   Yes [provider]  ARIPiprazole ER (ABILIFY MAINTENA) 400 MG SRER injection Inject 2 mLs (400 mg total) into the muscle every 28 (twenty-eight) days. Next injection due 05/08/18 05/08/18   McNew, Ileene Hutchinson, MD    Family History No family history on file.  Social History Social History   Tobacco Use  . Smoking status: Current Some Day Smoker    Packs/day: 1.00    Types: Cigarettes  . Smokeless tobacco: Never Used  Substance Use Topics  . Alcohol use: Not Currently    Alcohol/week: 4.2 oz    Types: 7 Shots of liquor per week  . Drug use: Not Currently    Types: Cocaine, Marijuana     Allergies   Patient has no known allergies.   Review of Systems Review of Systems  Constitutional: Negative for diaphoresis, fatigue and fever.  Respiratory: Positive for cough and wheezing. Negative for chest tightness and shortness of breath.   Cardiovascular: Positive for chest pain. Negative for palpitations and leg  swelling.  Musculoskeletal: Negative for back pain.  Neurological: Negative for dizziness, syncope and light-headedness.  Psychiatric/Behavioral: Negative for behavioral problems.     Physical Exam Updated Vital Signs BP (!) 111/55   Pulse 75   Temp 97.7 F (36.5 C)   Resp (!) 21   SpO2 99%   Physical Exam  Constitutional: He is oriented to person, place, and time. He appears well-developed and well-nourished.  HENT:  Head: Normocephalic and atraumatic.  Neck: Normal range of motion.  Cardiovascular: Regular rhythm. Tachycardia present.  Pulmonary/Chest: Effort normal. No tachypnea. No  respiratory distress. He has no decreased breath sounds. He has wheezes. He has no rhonchi. He has no rales. He exhibits tenderness. He exhibits no mass, no laceration, no crepitus, no edema, no deformity and no swelling.  Musculoskeletal: Normal range of motion.       Arms:      Right lower leg: Normal. He exhibits no tenderness and no edema.       Left lower leg: Normal. He exhibits no tenderness and no edema.  Neurological: He is alert and oriented to person, place, and time.  Skin: Skin is warm.  Psychiatric: He has a normal mood and affect. His behavior is normal.     ED Treatments / Results  Labs (all labs ordered are listed, but only abnormal results are displayed) Labs Reviewed  BASIC METABOLIC PANEL - Abnormal; Notable for the following components:      Result Value   Potassium 3.4 (*)    Glucose, Bld 239 (*)    Calcium 8.8 (*)    All other components within normal limits  CBC - Abnormal; Notable for the following components:   HCT 38.8 (*)    All other components within normal limits  I-STAT TROPONIN, ED    EKG None   EKG Interpretation  Date/Time:  Saturday May 25 2018 09:56:03 EDT Ventricular Rate:  109 PR Interval:  174 QRS Duration: 86 QT Interval:  350 QTC Calculation: 471 R Axis:   74 Text Interpretation:  Sinus tachycardia Right atrial enlargement Borderline ECG Since last EKG, rate has increased Otherwise no significant change Confirmed by Shaune PollackIsaacs, Cameron [phone]([id number]) on 05/25/2018 12:41:09 PM      Radiology Dg Chest 2 View  Result Date: 05/25/2018 CLINICAL DATA:  Right-sided chest pain. EXAM: CHEST - 2 VIEW COMPARISON:  Mar 31, 2018 FINDINGS: Minimal platelike opacity in the right lung, likely scar, unchanged since July 2016. No pneumothorax. No nodules or masses. No focal infiltrates. The cardiomediastinal silhouette is normal. No other acute abnormalities. IMPRESSION: No acute abnormalities. Electronically Signed   By: Gerome Samavid  Williams III M.D   On:  05/25/2018 10:28    Procedures Procedures (including critical care time) None   Medications Ordered in ED Given nebulized albuterol, lidoderm patch, Tylenol and refilled albuterol inhaler  Initial Impression / Assessment and Plan / ED Course  I have reviewed the triage vital signs and the nursing notes.  Pertinent labs & imaging results that were available during my care of the patient were reviewed by me and considered in my medical decision making (see chart for details).   Impression:   DDX includes ACS/MI, PE, pericarditis/endocarditis, aortic dissection, pneumonia, GERD, MSK etiology. Doubt ACS/MI, pericarditis/endocarditis due to normal EKG, troponin, stable vitals and no dizziness, nausea/vomitting, diaphoresis. Although slightly tachycardic, PE unlikely due to no other risk factors, no hypoxia, no calf/leg swelling, surgeries, recent travel, etc. Pneumonia, aortic dissection ruled out by normal chest xray. Most likely musculoskeletal  etiology due to reproducible non pleuritic chest pain on physical exam exacerbated with movement and bending over. Diffuse wheezing heard bilaterally in the lungs, most likely due to asthma exacerbation since he ran out of his albuterol last week. Improved with Duoneb treatment which confirms diagnosis.   Plan:  1. MSK tenderness: will give 1 Lidoderm 5% patch, 1000 mg of Tylenol for pain control in the ED. Provided a work note for one day off work. Discussed supportive measures such as continuing acetaminophen after discharge, ice if needed, resting muscles for several days. Instructed that if pain worsens with development of shortness of break, dizziness, diaphoresis, nausea/vomitting, fever, leg swelling or calf pain to return to the ER. Followup with family med doctor if pain has not resolved in 7-10 days   2. Asthma exacerbation: gave Duoneb 0.5-2.5 3mg /19mL nebulizing treatment which improved wheezing. Refilled Albuterol, discussed importance of  remaining compliant with medication and to avoid letting it run out to avoid exacerbations. Instructed to return to ER if symptoms worsen. Provided additional refill, instructed to follow up with PCP for future refills.   Final Clinical Impressions(s) / ED Diagnoses   Final diagnoses:  None    New Prescriptions New Prescriptions   No medications on file

## 2018-05-25 NOTE — ED Provider Notes (Signed)
[facility] Provider Note   CSN: [id number] Arrival date & time: 05/25/18  [id number]     History   Chief Complaint Chief Complaint  Patient presents with  . Chest Pain    HPI Dominic [name] is a 42 y.o. male with h/o asthma here for evaluation of CP. Described as sharp.  Localized to the right side of his chest.  Onset Friday morning.  States he was catching and lifting heavy box of Food at work on Thursday night.   Bags of food > 25 pounds. Pain is a 7/10.  Aggravated with direct palpation, moving around, bending over.  Laying still makes it better.  No interventions PTA.  He is requesting refill for albuterol inhaler.  States he  ran out of albuterol 2 weeks ago.  No fevers, chills, nausea, vomiting, dizziness, lightheadedness, diaphoresis, abdominal pain, leg swelling or pain. No recent prolonged travel or immobilization, h/o malignancy. Reports dry cough onset last night. No sore throat, congestion.  HPI  Past Medical History:  Diagnosis Date  . Bipolar 1 disorder (HCC)   . Hepatitis C   . Schizophrenia [facility](HCC)     Patient Active Problem List   Diagnosis Date Noted  . Schizophrenia (HCC) 02/16/2016  . 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 Alcohol abuse 02/16/2016  . Cocaine abuse (HCC) 02/16/2016    Past Surgical History:  Procedure Laterality Date  . CYST REMOVAL NECK     neck        Home Medications    Prior to Admission medications   Medication Sig Start Date End Date Taking? Authorizing Provider  ibuprofen (ADVIL,MOTRIN) 600 MG tablet Take 1 tablet (600 mg total) by mouth every 6 (six) hours as needed. 05/14/18  Yes Hedges, Tinnie GensJeffrey, PA-C  methocarbamol (ROBAXIN) 500 MG tablet Take 1 tablet (500 mg total) by mouth 2 (two) times daily. 05/14/18  Yes Hedges, Tinnie GensJeffrey, PA-C  Multiple Vitamins-Minerals (MULTIVITAMIN WITH MINERALS) tablet Take 1 tablet by mouth daily.   Yes [provider]  traZODone (DESYREL) 50 MG tablet  Take 50 mg by mouth at bedtime.   Yes [provider]  albuterol (PROVENTIL HFA;VENTOLIN HFA) 108 (90 Base) MCG/ACT inhaler Inhale 1-2 puffs into the lungs every 6 (six) hours as needed for wheezing or shortness of breath. 05/25/18   Liberty HandyGibbons, Jester Klingberg J, PA-C  ARIPiprazole ER (ABILIFY MAINTENA) 400 MG SRER injection Inject 2 mLs (400 mg total) into the muscle every 28 (twenty-eight) days. Next injection due 05/08/18 05/08/18   McNew, Ileene HutchinsonHolly R, MD    Family History No family history on file.  Social History Social History   Tobacco Use  . Smoking status: Current Some Day Smoker    Packs/day: 1.00    Types: Cigarettes  . Smokeless tobacco: Never Used  Substance Use Topics  . Alcohol use: Not Currently    Alcohol/week: 4.2 oz    Types: 7 Shots of liquor per week  . Drug use: Not Currently    Types: Cocaine, Marijuana     Allergies   Patient has no known allergies.   Review of Systems Review of Systems  Respiratory: Positive for cough.   Cardiovascular: Positive for chest pain.  All other systems reviewed and are negative.    Physical Exam Updated Vital Signs BP (!) 117/53   Pulse 90   Temp 97.7 F (36.5 C)   Resp (!) 24   SpO2 97%   Physical Exam  Constitutional: He appears well-developed and well-nourished.  NAD. Non toxic.   HENT:  Head: Normocephalic and atraumatic.  Nose: Nose normal.  Moist mucous membranes. Tonsils and oropharynx normal  Eyes: Conjunctivae, EOM and lids are normal.  Neck: Trachea normal and normal range of motion.  Trachea midline. No cervical adenopathy  Cardiovascular: Normal rate, regular rhythm, S1 normal, S2 normal and normal heart sounds.  Pulses:      Radial pulses are 2+ on the right side, and 2+ on the left side.       Dorsalis pedis pulses are 2+ on the right side, and 2+ on the left side.  Tenderness to right sternal border. Reproducible chest wall pain with active ROM of upper extremities against resistance and sitting up  on bed. No pain with deep inspiration. No LE edema or calf tenderness.   Pulmonary/Chest: Effort normal. No respiratory distress. He has wheezes in the right middle field, the right lower field, the left middle field and the left lower field. He exhibits tenderness.  No reproducible chest wall tenderness. CP not reproducible with AROM of upper extremities. No rales or wheezing.    Abdominal: Soft. Bowel sounds are normal. There is no tenderness.  No epigastric tenderness. NTND  Neurological: He is alert. GCS eye subscore is 4. GCS verbal subscore is 5. GCS motor subscore is 6.  Skin: Skin is warm and dry. Capillary refill takes less than 2 seconds.  No rash, ecchymosis or injury to skin over chest wall  Psychiatric: He has a normal mood and affect. His speech is normal and behavior is normal. Judgment and thought content normal. Cognition and memory are normal.     ED Treatments / Results  Labs (all labs ordered are listed, but only abnormal results are displayed) Labs Reviewed  BASIC METABOLIC PANEL - Abnormal; Notable for the following components:      Result Value   Potassium 3.4 (*)    Glucose, Bld 239 (*)    Calcium 8.8 (*)    All other components within normal limits  CBC - Abnormal; Notable for the following components:   HCT 38.8 (*)    All other components within normal limits  I-STAT TROPONIN, ED    EKG EKG Interpretation  Date/Time:  Saturday May 25 2018 09:56:03 EDT Ventricular Rate:  109 PR Interval:  174 QRS Duration: 86 QT Interval:  350 QTC Calculation: 471 R Axis:   74 Text Interpretation:  Sinus tachycardia Right atrial enlargement Borderline ECG Since last EKG, rate has increased Otherwise no significant change Confirmed by Shaune Pollack [phone]) on 05/25/2018 12:41:09 PM   Radiology Dg Chest 2 View  Result Date: 05/25/2018 CLINICAL DATA:  Right-sided chest pain. EXAM: CHEST - 2 VIEW COMPARISON:  Mar 31, 2018 FINDINGS: Minimal platelike opacity in the  right lung, likely scar, unchanged since July 2016. No pneumothorax. No nodules or masses. No focal infiltrates. The cardiomediastinal silhouette is normal. No other acute abnormalities. IMPRESSION: No acute abnormalities. Electronically Signed   By: Gerome Sam III M.D   On: 05/25/2018 10:28    Procedures Procedures (including critical care time)  Medications Ordered in ED Medications  lidocaine (LIDODERM) 5 % 1 patch (1 patch Transdermal Patch Applied 05/25/18 1326)  ipratropium-albuterol (DUONEB) 0.5-2.5 (3) MG/3ML nebulizer solution 3 mL (3 mLs Nebulization Given 05/25/18 1326)  acetaminophen (TYLENOL) tablet 1,000 mg (1,000 mg Oral Given 05/25/18 1324)  albuterol (PROVENTIL HFA;VENTOLIN HFA) 108 (90 Base) MCG/ACT inhaler 2 puff (2 puffs Inhalation Provided for home use 05/25/18 1436)  Initial Impression / Assessment and Plan / ED Course  I have reviewed the triage vital signs and the nursing notes.  Pertinent labs & imaging results that were available during my care of the patient were reviewed by me and considered in my medical decision making (see chart for details).      42 year old male here with chest wall pain.  Symptoms are intermittent.  Onset this morning after he had been catching and lifting and throwing heavy bags of cat food.  Reproducible with palpation, movement and active range of motion of his upper extremities.  No overlying rash or signs of skin injury.  Symptoms sound atypical.  Pertinent cardiac risk factors include previous use of cocaine that he denies currently.  No history of hypertension, hyperlipidemia, diabetes, obesity or family history.  He does smoke cigarettes.  Doubt PE as there is no pleuritic component, his initial tachycardic has resolved, no hypoxia or risk factors.  No lower extremity edema or calf pain.  Chest x-ray, EKG, troponin x1 WNL.  Screening labs normal.  Patient was given breathing treatment, Lidoderm patch, tylneol and reevaluated.  He  feels his pain has significantly improved.  Repeat auscultation of lungs with minimal wheezing.  His heart score is low.  Given atypical symptoms, reassuring ED work-up, improvement in symptoms with anti-inflammatories feel patient is adequate for discharge with treatment for likely MSK etiology.  Discussed return precautions.  Patient is in agreement. Final Clinical Impressions(s) / ED Diagnoses   Final diagnoses:  Chest wall pain  Medication refill    ED Discharge Orders        Ordered    albuterol (PROVENTIL HFA;VENTOLIN HFA) 108 (90 Base) MCG/ACT inhaler  Every 6 hours PRN     05/25/18 1426       Liberty Handy, PA-C 05/25/18 1611    Shaune Pollack, MD 05/26/18 1210

## 2018-05-25 NOTE — ED Notes (Signed)
Pt alert and oriented in NAD. Pt verbalized discharge instructions.  

## 2018-05-25 NOTE — Discharge Instructions (Signed)
You were seen in the emergency department for pain in your right chest.  Your heart enzymes, EKG, chest x-ray looked okay.  I suspect your pain is from recent exertional activity possibly worsening with cough.  Take Tylenol for pain.  Can purchase over-the-counter lidocaine patches and use topically.  Albuterol inhaler has been refilled.  Return to the ER for pain or shortness of breath with exertion, activity, fevers, worsening cough, phlegm, leg swelling or calf pain (salonpas).  Follow-up with primary care doctor in 7 to 10 days if the pain is not improving or resolving. Contact [facility] to establish care with a primary care provider for regular, routine medical care.  This clinic accepts patients without medical insurance. A primary care provider can adjust your daily medications and give you refills.

## 2018-05-25 NOTE — ED Triage Notes (Signed)
Pt reports after "catching bags of cat food" that were being thrown into his arms a few days ago he woke up with right sided chest pain yesterday. Also endorses sob, has hx of asthma, out of inhaler.

## 2018-05-28 ENCOUNTER — Encounter: Payer: Self-pay | Admitting: Infectious Disease

## 2018-05-28 ENCOUNTER — Ambulatory Visit (INDEPENDENT_AMBULATORY_CARE_PROVIDER_SITE_OTHER): Payer: Medicaid Other | Admitting: Infectious Disease

## 2018-05-28 ENCOUNTER — Ambulatory Visit: Payer: Medicaid Other | Admitting: Pharmacy Technician

## 2018-05-28 VITALS — BP 137/82 | HR 73 | Temp 98.0°F | Ht 71.0 in | Wt 245.0 lb

## 2018-05-28 DIAGNOSIS — F1010 Alcohol abuse, uncomplicated: Secondary | ICD-10-CM | POA: Diagnosis not present

## 2018-05-28 DIAGNOSIS — B20 Human immunodeficiency virus [HIV] disease: Secondary | ICD-10-CM

## 2018-05-28 DIAGNOSIS — F1410 Cocaine abuse, uncomplicated: Secondary | ICD-10-CM

## 2018-05-28 DIAGNOSIS — B182 Chronic viral hepatitis C: Secondary | ICD-10-CM

## 2018-05-28 DIAGNOSIS — Z23 Encounter for immunization: Secondary | ICD-10-CM | POA: Diagnosis not present

## 2018-05-28 DIAGNOSIS — R768 Other specified abnormal immunological findings in serum: Secondary | ICD-10-CM

## 2018-05-28 HISTORY — DX: Other specified abnormal immunological findings in serum: R76.8

## 2018-05-28 HISTORY — DX: Chronic viral hepatitis C: B18.2

## 2018-05-28 NOTE — Progress Notes (Signed)
HPI: Dominic SloughCedric Deon [name] is a 42 y.o. male who presents to the [facility] today to see Dr. Daiva EvesVan Dam for his positive Hepatitis C antibody.  Patient Active Problem List   Diagnosis Date Noted  . Hepatitis C antibody positive in blood 05/28/2018  . Chronic hepatitis C without hepatic coma (HCC) 05/28/2018  . Schizophrenia (HCC) 02/16/2016  . 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 Alcohol abuse 02/16/2016  . Cocaine abuse (HCC) 02/16/2016    Patient's Medications  New Prescriptions   No medications on file  Previous Medications   ALBUTEROL (PROVENTIL HFA;VENTOLIN HFA) 108 (90 BASE) MCG/ACT INHALER    Inhale 1-2 puffs into the lungs every 6 (six) hours as needed for wheezing or shortness of breath.   ARIPIPRAZOLE ER (ABILIFY MAINTENA) 400 MG SRER INJECTION    Inject 2 mLs (400 mg total) into the muscle every 28 (twenty-eight) days. Next injection due 05/08/18   IBUPROFEN (ADVIL,MOTRIN) 600 MG TABLET    Take 1 tablet (600 mg total) by mouth every 6 (six) hours as needed.   METHOCARBAMOL (ROBAXIN) 500 MG TABLET    Take 1 tablet (500 mg total) by mouth 2 (two) times daily.   MULTIPLE VITAMINS-MINERALS (MULTIVITAMIN WITH MINERALS) TABLET    Take 1 tablet by mouth daily.   TRAZODONE (DESYREL) 50 MG TABLET    Take 50 mg by mouth at bedtime.  Modified Medications   No medications on file  Discontinued Medications   No medications on file    Allergies: No Known Allergies  Past Medical History: Past Medical History:  Diagnosis Date  . Bipolar 1 disorder (HCC)   . Chronic hepatitis C without hepatic coma (HCC) 05/28/2018  . Hepatitis C   . Hepatitis C antibody positive in blood 05/28/2018  . Schizophrenia [facility](HCC)     Social History: Social History   Socioeconomic History  . Marital status: Single    Spouse name: Not on file  . Number of children: Not on file  . Years of education: Not on file  . Highest education level: Not on file  Occupational History  . Not on file  Social Needs    . Financial resource strain: Not on file  . Food insecurity:    Worry: Not on file    Inability: Not on file  . Transportation needs:    Medical: Not on file    Non-medical: Not on file  Tobacco Use  . Smoking status: Current Some Day Smoker    Packs/day: 1.00    Types: Cigarettes  . Smokeless tobacco: Never Used  Substance and Sexual Activity  . Alcohol use: Not Currently    Alcohol/week: 4.2 oz    Types: 7 Shots of liquor per week  . Drug use: Not Currently    Types: Cocaine, Marijuana  . Sexual activity: Not Currently  Lifestyle  . Physical activity:    Days per week: Not on file    Minutes per session: Not on file  . Stress: Not on file  Relationships  . Social connections:    Talks on phone: Not on file    Gets together: Not on file    Attends religious service: Not on file    Active member of club or organization: Not on file    Attends meetings of clubs or organizations: Not on file    Relationship status: Not on file  Other Topics Concern  . Not on file  Social History Narrative  . Not on file  Labs: Hepatitis C No results found for: HCVGENOTYPE, HEPCAB, HCVRNAPCRQN, FIBROSTAGE Hepatitis B Lab Results  Component Value Date   HEPBSAG Negative 05/11/2018   Hepatitis A No results found for: HAV HIV No results found for: HIV Lab Results  Component Value Date   CREATININE 0.84 05/25/2018   CREATININE 0.90 05/11/2018   CREATININE 0.90 05/11/2018   CREATININE 0.84 04/08/2018   CREATININE 0.93 11/15/2017   Lab Results  Component Value Date   AST 55 (H) 05/11/2018   AST 36 04/08/2018   AST 60 (H) 11/15/2017   ALT 55 (H) 05/11/2018   ALT 32 04/08/2018   ALT 68 (H) 11/15/2017     Assessment: Dominic [name] is here today to see Dr. Daiva Eves for his positive Hepatitis C antibody.  He has no other labs including a Hep C RNA for confirmation.  I spoke to him today to go over the process of getting and taking medications for Hepatitis C. I explained the labs  that we are going to collect and that 15-20% of patients clear the infection by themselves. I explained that once we get the confirmatory tests, we will start the process of getting him Hep C medications - most likely Mavyret since he has Medicaid. We will f/u on labs and start the process once they come back.   Plan: - Hep C RNA, Hep C genotype, CMET, CBC, fibrosure, INR/PT, elastography is positive - Will f/u once labs are back  Cassie L. Kuppelweiser, PharmD, AAHIVP, CPP Infectious Diseases Clinical Pharmacist [facility] 05/28/2018, 11:47 AM

## 2018-05-28 NOTE — Progress Notes (Signed)
Subjective:   Chief complaint is anxiety about hepatitis C   Patient ID: Dominic [name], male    DOB: October 21, 1976, 42 y.o.   MRN: [id number]  HPI  This is a 42 year old African-American man with a history of bipolar disease/schizophrenia with polysubstance abuse including crack cocaine marijuana and alcohol.  He is currently residing in  [facility].  Dates that he had previously tried to donate blood and then been told that he tested positive for hepatitis C.  He states he was then tested [facility] at [facility] and then again at urgent care when he came in on May 11, 2018.  He denies any history of intravenous drug use.  He does have a history of having had multiple sexual partners including prostitutes.  He denies having sex with men ever.  He currently is engaged to be married and is in a monogamous relationship he states.  Does have tattoos and certainly this could be a source of hepatitis C as could sexual transmission.  I do not see that he was tested for HIV in our lab so that he would have been tested by the blood bank still I want this checked.  He also has not been checked for hepatitis A total antibodies or hepatitis B surface antibody positivity.  Finally he has not had hepatitis C RNA level testing or genotype testing so does not entirely clear that he has ever active hepatitis C and it could be possible that he is cleared it.  Past Medical History:  Diagnosis Date  . Bipolar 1 disorder (HCC)   . Chronic hepatitis C without hepatic coma (HCC) 05/28/2018  . Hepatitis C   . Hepatitis C antibody positive in blood 05/28/2018  . Schizophrenia [facility](HCC)     Past Surgical History:  Procedure Laterality Date  . CYST REMOVAL NECK     neck    No family history on file.    Social History   Socioeconomic History  . Marital status: Single    Spouse name: Not on file  . Number of children: Not on file  . Years of education: Not on file  . Highest education level: Not  on file  Occupational History  . Not on file  Social Needs  . Financial resource strain: Not on file  . Food insecurity:    Worry: Not on file    Inability: Not on file  . Transportation needs:    Medical: Not on file    Non-medical: Not on file  Tobacco Use  . Smoking status: Current Some Day Smoker    Packs/day: 1.00    Types: Cigarettes  . Smokeless tobacco: Never Used  Substance and Sexual Activity  . Alcohol use: Not Currently    Alcohol/week: 4.2 oz    Types: 7 Shots of liquor per week  . Drug use: Not Currently    Types: Cocaine, Marijuana  . Sexual activity: Not Currently  Lifestyle  . Physical activity:    Days per week: Not on file    Minutes per session: Not on file  . Stress: Not on file  Relationships  . Social connections:    Talks on phone: Not on file    Gets together: Not on file    Attends religious service: Not on file    Active member of club or organization: Not on file    Attends meetings of clubs or organizations: Not on file    Relationship status: Not on file  Other  Topics Concern  . Not on file  Social History Narrative  . Not on file    No Known Allergies   Current Outpatient Medications:  .  albuterol (PROVENTIL HFA;VENTOLIN HFA) 108 (90 Base) MCG/ACT inhaler, Inhale 1-2 puffs into the lungs every 6 (six) hours as needed for wheezing or shortness of breath., Disp: 1 Inhaler, Rfl: 0 .  ARIPiprazole ER (ABILIFY MAINTENA) 400 MG SRER injection, Inject 2 mLs (400 mg total) into the muscle every 28 (twenty-eight) days. Next injection due 05/08/18, Disp: 1 each, Rfl: 0 .  ibuprofen (ADVIL,MOTRIN) 600 MG tablet, Take 1 tablet (600 mg total) by mouth every 6 (six) hours as needed., Disp: 30 tablet, Rfl: 0 .  methocarbamol (ROBAXIN) 500 MG tablet, Take 1 tablet (500 mg total) by mouth 2 (two) times daily., Disp: 20 tablet, Rfl: 0 .  Multiple Vitamins-Minerals (MULTIVITAMIN WITH MINERALS) tablet, Take 1 tablet by mouth daily., Disp: , Rfl:  .   traZODone (DESYREL) 50 MG tablet, Take 50 mg by mouth at bedtime., Disp: , Rfl:     Review of Systems  Constitutional: Negative for activity change, appetite change, chills, diaphoresis, fatigue, fever and unexpected weight change.  HENT: Negative for congestion, rhinorrhea, sinus pressure, sneezing, sore throat and trouble swallowing.   Eyes: Negative for photophobia and visual disturbance.  Respiratory: Negative for cough, chest tightness, shortness of breath, wheezing and stridor.   Cardiovascular: Negative for chest pain, palpitations and leg swelling.  Gastrointestinal: Negative for abdominal distention, abdominal pain, anal bleeding, blood in stool, constipation, diarrhea, nausea and vomiting.  Genitourinary: Negative for difficulty urinating, dysuria, flank pain and hematuria.  Musculoskeletal: Negative for arthralgias, back pain, gait problem, joint swelling and myalgias.  Skin: Negative for color change, pallor, rash and wound.  Neurological: Negative for dizziness, tremors, weakness and light-headedness.  Hematological: Negative for adenopathy. Does not bruise/bleed easily.  Psychiatric/Behavioral: Negative for agitation, behavioral problems, confusion, decreased concentration, dysphoric mood and sleep disturbance.       Objective:   Physical Exam  Constitutional: He is oriented to person, place, and time. He appears well-developed and well-nourished. No distress.  HENT:  Head: Normocephalic and atraumatic.  Mouth/Throat: No oropharyngeal exudate.  Eyes: Conjunctivae and EOM are normal. No scleral icterus.  Neck: Normal range of motion. Neck supple.  Cardiovascular: Normal rate and regular rhythm.  Pulmonary/Chest: Effort normal. No respiratory distress. He has no wheezes.  Abdominal: He exhibits no distension.  Musculoskeletal: He exhibits no edema or tenderness.  Neurological: He is alert and oriented to person, place, and time. He exhibits normal muscle tone. Coordination  normal.  Skin: Skin is warm and dry. No rash noted. He is not diaphoretic. No erythema. No pallor.  Psychiatric: His behavior is normal. Judgment and thought content normal. His mood appears anxious.          Assessment & Plan:   Hepatitis C antibody positivity: We will check hepatitis C RNA level and hepatitis C genotype.  Will be helpful to have other records because we do not know if this is a recent seroconversion or whether he truly has chronic hepatitis C.  We will check a comprehensive metabolic panel as well as a PT and INR CBC with differential as well as a FibroSure  Check hepatitis A antibody as well as hepatitis B surface antibody titers  I will check an HIV antibody  We will have him meet with Kathie Rhodes to fill out forms for Medicaid.  Polysubstance abuse: Hopefully can remain in remission this  is will be critical to him gaining approval for cure for hepatitis C if he indeed has chronic hepatitis C.  Marland Kitchen

## 2018-05-29 LAB — HEPATITIS B SURFACE ANTIBODY, QUANTITATIVE: Hepatitis B-Post: <5 m[IU]/mL — ABNORMAL LOW (ref >=10)

## 2018-05-29 LAB — CBC WITH DIFFERENTIAL/PLATELET
BASOS PCT: 0.7 %
Basophils Absolute: 50 cells/uL (ref 0–200)
Eosinophils Absolute: 128 cells/uL (ref 15–500)
Eosinophils Relative: 1.8 %
HEMATOCRIT: 40.8 % (ref 38.5–50.0)
HEMOGLOBIN: 13.9 g/dL (ref 13.2–17.1)
Lymphs Abs: 2847 cells/uL (ref 850–3900)
MCH: 29.7 pg (ref 27.0–33.0)
MCHC: 34.1 g/dL (ref 32.0–36.0)
MCV: 87.2 fL (ref 80.0–100.0)
MONOS PCT: 9.7 %
MPV: 10.8 fL (ref 7.5–12.5)
Neutro Abs: 3387 cells/uL (ref 1500–7800)
Neutrophils Relative %: 47.7 %
Platelets: 206 10*3/uL (ref 140–400)
RBC: 4.68 10*6/uL (ref 4.20–5.80)
RDW: 14.8 % (ref 11.0–15.0)
Total Lymphocyte: 40.1 %
WBC: 7.1 10*3/uL (ref 3.8–10.8)
WBCMIX: 689 {cells}/uL (ref 200–950)

## 2018-05-29 LAB — COMPLETE METABOLIC PANEL WITH GFR
AG Ratio: 1.3 (calc) (ref 1.0–2.5)
ALKALINE PHOSPHATASE (APISO): 44 U/L (ref 40–115)
ALT: 69 U/L — AB (ref 9–46)
AST: 51 U/L — AB (ref 10–40)
Albumin: 4 g/dL (ref 3.6–5.1)
BUN: 16 mg/dL (ref 7–25)
CO2: 26 mmol/L (ref 20–32)
CREATININE: 0.85 mg/dL (ref 0.60–1.35)
Calcium: 9.1 mg/dL (ref 8.6–10.3)
Chloride: 106 mmol/L (ref 98–110)
GFR, Est African American: 126 mL/min/{1.73_m2} (ref >=60)
GFR, Est Non African American: 109 mL/min/{1.73_m2} (ref >=60)
GLOBULIN: 3.2 g/dL (ref 1.9–3.7)
GLUCOSE: 113 mg/dL — AB (ref 65–99)
Potassium: 4.3 mmol/L (ref 3.5–5.3)
Sodium: 137 mmol/L (ref 135–146)
Total Bilirubin: 0.4 mg/dL (ref 0.2–1.2)
Total Protein: 7.2 g/dL (ref 6.1–8.1)

## 2018-05-29 LAB — HIV ANTIBODY (ROUTINE TESTING W REFLEX): HIV 1&2 Ab, 4th Generation: NONREACTIVE

## 2018-05-29 LAB — PROTIME-INR
INR: 0.9
PROTHROMBIN TIME: 9.7 s (ref 9.0–11.5)

## 2018-05-29 LAB — HEPATITIS A ANTIBODY, TOTAL: HEPATITIS A AB,TOTAL: NONREACTIVE

## 2018-05-30 LAB — HEPATITIS C RNA QUANTITATIVE
HCV Quantitative Log: 6.08 Log IU/mL — ABNORMAL HIGH
HCV RNA, PCR, QN: 1200000 IU/mL — ABNORMAL HIGH

## 2018-05-31 LAB — HEPATITIS C GENOTYPE

## 2018-06-28 ENCOUNTER — Ambulatory Visit: Payer: Self-pay | Admitting: Infectious Disease

## 2018-11-15 ENCOUNTER — Ambulatory Visit: Payer: Medicaid Other | Admitting: Nurse Practitioner

## 2018-11-20 ENCOUNTER — Ambulatory Visit: Payer: Medicaid Other | Admitting: Nurse Practitioner

## 2018-11-25 ENCOUNTER — Ambulatory Visit: Payer: Medicaid Other | Admitting: Nurse Practitioner

## 2018-11-25 ENCOUNTER — Encounter: Payer: Self-pay | Admitting: Nurse Practitioner

## 2018-11-25 VITALS — BP 124/76 | HR 65 | Temp 98.3°F | Ht 70.0 in | Wt 215.0 lb

## 2018-11-25 DIAGNOSIS — J4520 Mild intermittent asthma, uncomplicated: Secondary | ICD-10-CM

## 2018-11-25 DIAGNOSIS — F1410 Cocaine abuse, uncomplicated: Secondary | ICD-10-CM | POA: Diagnosis not present

## 2018-11-25 DIAGNOSIS — B182 Chronic viral hepatitis C: Secondary | ICD-10-CM | POA: Diagnosis not present

## 2018-11-25 DIAGNOSIS — J45909 Unspecified asthma, uncomplicated: Secondary | ICD-10-CM | POA: Insufficient documentation

## 2018-11-25 DIAGNOSIS — F17210 Nicotine dependence, cigarettes, uncomplicated: Secondary | ICD-10-CM

## 2018-11-25 DIAGNOSIS — F200 Paranoid schizophrenia: Secondary | ICD-10-CM | POA: Diagnosis not present

## 2018-11-25 NOTE — Patient Instructions (Signed)
Schizophrenia  Schizophrenia is a mental illness. It may cause disturbed or disorganized thinking, speech, or behavior. People with schizophrenia have problems functioning in one or more areas of life. People with schizophrenia are at increased risk for suicide, certain long-term (chronic) physical illnesses, and unhealthy behaviors, such as smoking and drug use.  People who have family members with schizophrenia are at higher risk of developing the illness. Schizophrenia affects men and women equally, but it usually appears at an earlier age (teenage or early adult years) in men.  What are the causes?  The cause of this condition is not known.  What increases the risk?  The following factors may make you more likely to develop this condition:   Having a family member who has schizophrenia. Some gene combinations may increase the risk, but there is no single gene that causes schizophrenia.   Impaired brain or neurotransmitter development or chemistry. Neurotransmitters are chemicals in the brain.  What are the signs or symptoms?  The earliest symptoms are often subtle and may go unnoticed until the illness becomes more severe (first-break psychosis). Symptoms of schizophrenia may be ongoing (continuous) or may come and go in severity. Episodes are often triggered by major life events, such as:   Family stress.   College.   Military service.   Marriage.   Pregnancy or childbirth.   Divorce.   Loss of a loved one.  Symptoms may include:   Seeing, hearing, or feeling things that do not exist (hallucinations).   Having false beliefs (delusions). Delusions often involve beliefs that you are being attacked, harassed, cheated, persecuted, or conspired against (persecutory delusions).   Speech that does not make sense to others or is hard to understand (incoherent).   Behavior that is odd, confused, unfocused, withdrawn, or disorganized.   Extremely overactive or underactive motor activity (catatonia). Motor  activity is any action that involves the muscles.   Bland or blunted emotions (flat affect).   Loss of will power (avolition).   Withdrawal from social contacts (social isolation).  Symptoms may affect the level of functioning in one or more major areas of life, such as work, school, relationships, or self-care.  How is this diagnosed?  Schizophrenia is diagnosed through an assessment by a mental health care provider.   Your mental health care provider may ask questions about:  ? Your thoughts, behavior, and mood.  ? Your ability to function in daily life.  ? Your medical history.  ? Any use of alcohol or drugs, including prescription medicines.   You may have blood tests and imaging exams.  How is this treated?  Schizophrenia is a chronic illness that is best controlled with continuous treatment rather than treatment only when symptoms occur. The following treatments are used to manage schizophrenia:   Medicine. This is the most effective and important form of treatment for schizophrenia. Antipsychotic medicines are usually prescribed to help manage schizophrenia. Other types of medicine may be added to relieve any symptoms that may occur despite the use of antipsychotic medicines.   Counseling or talk therapy. Individual, group, or family counseling may be helpful in providing education, support, and guidance. Many people also benefit from social skills and job skills (vocational) training.  A combination of medicine and counseling is best for managing the disorder over time. A procedure in which electricity is applied to the brain through the scalp (electroconvulsive therapy) may be used to treat catatonic schizophrenia or schizophrenia in people who cannot take medicine or do not   respond to medicine and counseling.  Follow these instructions at home:     Keep stress under control. Stress may trigger psychosis and make symptoms worse.   Try to get as much sleep as you can.   Avoid alcohol and drugs.  They can affect how medicine works and make symptoms worse.   Surround yourself with people who care about you and can help you manage your condition.   Take over-the-counter and prescription medicines only as told by your health care provider.   Keep all follow-up visits as told by your health care provider and counselor. This is important.  Contact a health care provider if:   You have a bad response to changes in medicines or to your treatment plan.   You have trouble falling sleep.   You have a low mood that will not go away.   You are using:  ? Drugs.  ? Too much caffeine.  ? Tobacco products.  ? Alcohol.  Get help right away if:   You feel out of control.   You or others notice warning signs of suicide such as:  ? Increased use of drugs or alcohol  ? Expressing feelings of not having a purpose in life, being trapped, guilty, anxious and agitated, or hopeless.  ? Withdrawing from friends and family.  ? Showing uncontrolled anger, recklessness, and dramatic mood changes.  ? Talking about suicide, discussing or searching for methods.  If you ever feel like you may hurt yourself or others, or have thoughts about taking your own life, get help right away. You can go to your nearest emergency department or call:   Your local emergency services (911 in the [place]   A suicide crisis helpline, such as the National Suicide Prevention Lifeline at 1-800-273-8255. This is open 24 hours a day.  Summary   Schizophrenia is a mental illness that causes disturbed or disorganized thinking, speech, or behavior.   Symptoms of schizophrenia may be ongoing or may come and go. They are often triggered by major life events.   Keep stress under control. Stress may trigger psychosis and make symptoms worse.   Avoid alcohol and drugs. They can affect how medicine works and make symptoms worse.   Get help right away if you feel out of control.  This information is not intended to replace advice given to you by your health  care provider. Make sure you discuss any questions you have with your health care provider.  Document Released: 10/27/2000 Document Revised: 08/11/2016 Document Reviewed: 08/11/2016  Elsevier Interactive Patient Education  2019 [employer]

## 2018-11-25 NOTE — Assessment & Plan Note (Signed)
I have recommended complete cessation of tobacco use. I have discussed various options available for assistance with tobacco cessation including over the counter methods (Nicotine gum, patch and lozenges). We also discussed prescription options (Chantix, Nicotine Inhaler / Nasal Spray). The patient is not interested in pursuing any prescription tobacco cessation options at this time.  

## 2018-11-25 NOTE — Assessment & Plan Note (Signed)
Chronic, stable.  Continue current inhaler regimen.  Recommend cessation of tobacco use.

## 2018-11-25 NOTE — Assessment & Plan Note (Signed)
Chronic, missed follow-up in [place] with ID.  Referral to GI locally for further recommendations and guidance.  Patient wishes to initiate treatment.  Labs present from July 2019.

## 2018-11-25 NOTE — Assessment & Plan Note (Addendum)
Have recommend complete cessation and had at length conversation about risks with continued use.  He reports wishes to cut back and quit.  Continue to monitor.  UDS next visit.  Refuses inpatient rehab program.

## 2018-11-25 NOTE — Progress Notes (Signed)
New Patient Office Visit  Subjective:  Patient ID: Dominic [name], male    DOB: June 15, 1976  Age: 43 y.o. MRN: [id number]  CC:  Chief Complaint  Patient presents with  . Establish Care    pt would like to talk about a recent diagnose of Hepatitis C    HPI Dominic [name] presents for new patient visit to establish care.  Introduced to Publishing rights managernurse practitioner role and practice setting.  All questions answered. Current history includes Hep C positive followed by ID and on review last seen 05/28/18, Substance abuse (cocaine), paranoid schizophrenia (has h/o periods of going off medications during homelessness, which on review leads to increased behaviors), asthma, sleep apnea (currently no CPAP).  SCHIZOPHRENIA (PARANOID): Current psych meds include Abilify injection monthly and Trazodone.  On review of chart has history of several ED visits related to schizophrenia with behaviors (hallucinations), last 04/08/18.  Currently followed by [facility].  He reports he gets injections every month at the clinic. He attends therapy sessions there as well.  Reports increased issues with sleep, currently on Trazodone for this which was started about 6 months ago.  Denies SI/HI.  Denies auditory hallucinations.  States he has had all his life, but became worse in teen years.  HEPATITIS C: Followed by ID and last seen by Dr. Daiva EvesVan Dam on 05/28/18.  Was to follow-up once labs returned, but no follow-up noted in chart.  He reports [place] is too far for him to go, is requesting local follow-up.  Agrees to referral to GI locally for further recommendations and direction.  He is interested in initiating treatment.  ASTHMA Uses inhaler about once a week.  Is a current a smoker, pack a day.   Asthma status: controlled Satisfied with current treatment?: yes Albuterol/rescue inhaler frequency:  Dyspnea frequency: none Wheezing frequency: intermitent, once or twice a month Cough frequency: none Nocturnal symptom frequency:  none Limitation of activity: no Current upper respiratory symptoms: no Triggers:  Home peak flows: Last Spirometry:  Failed/intolerant to following asthma meds:  Asthma meds in past:  Aerochamber/spacer use: yes Visits to ER or Urgent Care in past year: yes Pneumovax: not up to date Influenza: Up to Date   SUBSTANCE ABUSE: Followed by [facility] for mental health.  He continues to use cocaine, last one week ago, and MJ daily.  Have recommended complete cessation and discussed at length the major risks present if continued use.    Past Medical History:  Diagnosis Date  . Anxiety   . Asthma   . Bipolar 1 disorder (HCC)   . Chronic hepatitis C without hepatic coma (HCC) 05/28/2018  . Depression   . Hepatitis C   . Hepatitis C antibody positive in blood 05/28/2018  . Schizophrenia (HCC)   . Sleep apnea     Past Surgical History:  Procedure Laterality Date  . CYST REMOVAL NECK     neck    Family History  Problem Relation Age of Onset  . Hypertension Sister   . Schizophrenia Sister     Social History   Socioeconomic History  . Marital status: Single    Spouse name: Not on file  . Number of children: Not on file  . Years of education: Not on file  . Highest education level: Not on file  Occupational History  . Not on file  Social Needs  . Financial resource strain: Not very hard  . Food insecurity:    Worry: Never true    Inability: Never  true  . Transportation needs:    Medical: No    Non-medical: No  Tobacco Use  . Smoking status: Current Some Day Smoker    Packs/day: 1.00    Types: Cigarettes  . Smokeless tobacco: Never Used  Substance and Sexual Activity  . Alcohol use: Yes    Alcohol/week: 3.0 standard drinks    Types: 3 Shots of liquor per week  . Drug use: Yes    Types: Cocaine, Marijuana    Comment: last used cocaine Thursday and MJ use daily  . Sexual activity: Not Currently  Lifestyle  . Physical activity:    Days per week: 0 days    Minutes per  session: 0 min  . Stress: Only a little  Relationships  . Social connections:    Talks on phone: Twice a week    Gets together: Twice a week    Attends religious service: Never    Active member of club or organization: No    Attends meetings of clubs or organizations: Never    Relationship status: Patient refused  . Intimate partner violence:    Fear of current or ex partner: No    Emotionally abused: No    Physically abused: No    Forced sexual activity: No  Other Topics Concern  . Not on file  Social History Narrative  . Not on file    ROS Review of Systems  Constitutional: Negative for activity change, diaphoresis, fatigue and fever.  Respiratory: Negative for cough, chest tightness, shortness of breath and wheezing.   Cardiovascular: Negative for chest pain, palpitations and leg swelling.  Gastrointestinal: Negative for abdominal distention, abdominal pain, constipation, diarrhea, nausea and vomiting.  Endocrine: Negative for cold intolerance, heat intolerance, polydipsia, polyphagia and polyuria.  Musculoskeletal: Negative.   Skin: Negative.   Neurological: Negative for dizziness, syncope, weakness, light-headedness, numbness and headaches.  Psychiatric/Behavioral: Negative.     Objective:   Today's Vitals: BP 124/76   Pulse 65   Temp 98.3 F (36.8 C) (Oral)   Ht 5\' 10"  (1.778 m)   Wt 215 lb (97.5 kg)   SpO2 95%   BMI 30.85 kg/m   Physical Exam Vitals signs and nursing note reviewed.  Constitutional:      Appearance: He is well-developed.  HENT:     Head: Normocephalic and atraumatic.     Right Ear: Hearing normal. No drainage.     Left Ear: Hearing normal. No drainage.     Mouth/Throat:     Pharynx: Uvula midline.  Eyes:     General: Lids are normal.        Right eye: No discharge.        Left eye: No discharge.     Conjunctiva/sclera: Conjunctivae normal.     Pupils: Pupils are equal, round, and reactive to light.  Neck:     Musculoskeletal:  Normal range of motion and neck supple.     Thyroid: No thyromegaly.     Vascular: No carotid bruit or JVD.     Trachea: Trachea normal.  Cardiovascular:     Rate and Rhythm: Normal rate and regular rhythm.     Heart sounds: Normal heart sounds, S1 normal and S2 normal. No murmur. No gallop.   Pulmonary:     Effort: Pulmonary effort is normal.     Breath sounds: Normal breath sounds.  Abdominal:     General: Bowel sounds are normal.     Palpations: Abdomen is soft. There is no hepatomegaly or  splenomegaly.  Musculoskeletal: Normal range of motion.  Skin:    General: Skin is warm and dry.     Capillary Refill: Capillary refill takes less than 2 seconds.     Findings: No rash.  Neurological:     Mental Status: He is alert and oriented to person, place, and time.     Deep Tendon Reflexes: Reflexes are normal and symmetric.  Psychiatric:        Mood and Affect: Mood normal.        Behavior: Behavior normal.        Thought Content: Thought content normal.        Judgment: Judgment normal.     Assessment & Plan:   Problem List Items Addressed This Visit      Respiratory   Asthma    Chronic, stable.  Continue current inhaler regimen.  Recommend cessation of tobacco use.      Relevant Medications   albuterol (PROVENTIL HFA;VENTOLIN HFA) 108 (90 Base) MCG/ACT inhaler     Digestive   Chronic hepatitis C without hepatic coma (HCC) - Primary    Chronic, missed follow-up in [place] with ID.  Referral to GI locally for further recommendations and guidance.  Patient wishes to initiate treatment.  Labs present from July 2019.      Relevant Orders   Ambulatory referral to Gastroenterology     Other   Schizophrenia [facility])    Chronic with auditory hallucinations.  Currently followed by [facility] and reports no SI/HI, hallucinations are "under control".  Continue to collaborate with [facility] for psychiatry and therapy.      Cocaine abuse (HCC)    Have recommend complete cessation and had at  length conversation about risks with continued use.  He reports wishes to cut back and quit.  Continue to monitor.  UDS next visit.  Refuses inpatient rehab program.      Nicotine dependence, cigarettes, uncomplicated    I have recommended complete cessation of tobacco use. I have discussed various options available for assistance with tobacco cessation including over the counter methods (Nicotine gum, patch and lozenges). We also discussed prescription options (Chantix, Nicotine Inhaler / Nasal Spray). The patient is not interested in pursuing any prescription tobacco cessation options at this time.         Outpatient Encounter Medications as of 11/25/2018  Medication Sig  . ARIPiprazole ER (ABILIFY MAINTENA) 400 MG SRER injection Inject 2 mLs (400 mg total) into the muscle every 28 (twenty-eight) days. Next injection due 05/08/18  . ibuprofen (ADVIL,MOTRIN) 600 MG tablet Take 1 tablet (600 mg total) by mouth every 6 (six) hours as needed.  . methocarbamol (ROBAXIN) 500 MG tablet Take 1 tablet (500 mg total) by mouth 2 (two) times daily.  . Multiple Vitamins-Minerals (MULTIVITAMIN WITH MINERALS) tablet Take 1 tablet by mouth daily.  . traZODone (DESYREL) 50 MG tablet Take 50 mg by mouth at bedtime.  Marland Kitchen albuterol (PROVENTIL HFA;VENTOLIN HFA) 108 (90 Base) MCG/ACT inhaler Inhale into the lungs every 6 (six) hours as needed for wheezing or shortness of breath.  . [DISCONTINUED] albuterol (PROVENTIL HFA;VENTOLIN HFA) 108 (90 Base) MCG/ACT inhaler Inhale 1-2 puffs into the lungs every 6 (six) hours as needed for wheezing or shortness of breath.   No facility-administered encounter medications on file as of 11/25/2018.     Follow-up: Return in about 4 weeks (around 12/23/2018) for physical exam.   Marjie Skiff, NP

## 2018-11-25 NOTE — Assessment & Plan Note (Signed)
Chronic with auditory hallucinations.  Currently followed by [facility] and reports no SI/HI, hallucinations are "under control".  Continue to collaborate with [facility] for psychiatry and therapy.

## 2018-11-28 ENCOUNTER — Encounter: Payer: Self-pay | Admitting: *Deleted

## 2018-12-05 ENCOUNTER — Encounter: Payer: Self-pay | Admitting: Gastroenterology

## 2018-12-26 ENCOUNTER — Ambulatory Visit: Payer: Medicaid Other | Admitting: Nurse Practitioner

## 2019-01-02 ENCOUNTER — Ambulatory Visit: Payer: Medicaid Other | Admitting: Nurse Practitioner

## 2019-01-06 ENCOUNTER — Ambulatory Visit: Payer: Medicaid Other | Admitting: Nurse Practitioner

## 2019-01-08 ENCOUNTER — Ambulatory Visit: Payer: Medicaid Other | Admitting: Nurse Practitioner

## 2019-01-13 ENCOUNTER — Encounter: Payer: Self-pay | Admitting: Emergency Medicine

## 2019-01-13 ENCOUNTER — Other Ambulatory Visit: Payer: Self-pay

## 2019-01-13 ENCOUNTER — Emergency Department
Admission: EM | Admit: 2019-01-13 | Discharge: 2019-01-14 | Disposition: A | Discharge status: Other Acute Inpatient Hospital | Payer: Medicaid Other | Attending: Emergency Medicine | Admitting: Emergency Medicine

## 2019-01-13 DIAGNOSIS — J45909 Unspecified asthma, uncomplicated: Secondary | ICD-10-CM | POA: Diagnosis not present

## 2019-01-13 DIAGNOSIS — F17210 Nicotine dependence, cigarettes, uncomplicated: Secondary | ICD-10-CM | POA: Insufficient documentation

## 2019-01-13 DIAGNOSIS — F10920 Alcohol use, unspecified with intoxication, uncomplicated: Secondary | ICD-10-CM

## 2019-01-13 DIAGNOSIS — F29 Unspecified psychosis not due to a substance or known physiological condition: Secondary | ICD-10-CM | POA: Diagnosis present

## 2019-01-13 DIAGNOSIS — F209 Schizophrenia, unspecified: Secondary | ICD-10-CM

## 2019-01-13 HISTORY — DX: Alcohol use, unspecified with intoxication, uncomplicated: F10.920

## 2019-01-13 LAB — CBC
HCT: 40.5 % (ref 39.0–52.0)
Hemoglobin: 14.7 g/dL (ref 13.0–17.0)
MCH: 29.6 pg (ref 26.0–34.0)
MCHC: 36.3 g/dL — ABNORMAL HIGH (ref 30.0–36.0)
MCV: 81.5 fL (ref 80.0–100.0)
NRBC: 0 % (ref 0.0–0.2)
Platelets: 202 10*3/uL (ref 150–400)
RBC: 4.97 MIL/uL (ref 4.22–5.81)
RDW: 13.6 % (ref 11.5–15.5)
WBC: 7.4 10*3/uL (ref 4.0–10.5)

## 2019-01-13 LAB — COMPREHENSIVE METABOLIC PANEL
ALT: 50 U/L — ABNORMAL HIGH (ref 0–44)
ANION GAP: 8 (ref 5–15)
AST: 44 U/L — ABNORMAL HIGH (ref 15–41)
Albumin: 4.1 g/dL (ref 3.5–5.0)
Alkaline Phosphatase: 36 U/L — ABNORMAL LOW (ref 38–126)
BUN: 15 mg/dL (ref 6–20)
CO2: 23 mmol/L (ref 22–32)
Calcium: 8.4 mg/dL — ABNORMAL LOW (ref 8.9–10.3)
Chloride: 106 mmol/L (ref 98–111)
Creatinine, Ser: 0.89 mg/dL (ref 0.61–1.24)
GFR calc Af Amer: >60 mL/min (ref >60)
GFR calc non Af Amer: >60 mL/min (ref >60)
GLUCOSE: 122 mg/dL — AB (ref 70–99)
Potassium: 3.4 mmol/L — ABNORMAL LOW (ref 3.5–5.1)
SODIUM: 137 mmol/L (ref 135–145)
Total Bilirubin: 0.6 mg/dL (ref 0.3–1.2)
Total Protein: 7.6 g/dL (ref 6.5–8.1)

## 2019-01-13 LAB — ETHANOL: Alcohol, Ethyl (B): <10 mg/dL (ref <10)

## 2019-01-13 MED ORDER — TRAZODONE HCL 100 MG PO TABS: 100.0000 mg | ORAL_TABLET | Freq: Once | ORAL | Status: DC

## 2019-01-13 NOTE — ED Notes (Signed)
Hourly rounding reveals patient in room talking to TTS. No complaints, stable, in no acute distress. Q15 minute rounds and monitoring via Security Cameras to continue. 

## 2019-01-13 NOTE — ED Triage Notes (Addendum)
PT via [employer]  With IVC paperwork. PT states " this girl I used to be with has it out for me and told me she was going to commit me, shes just being spiteful" . PT A&OX4, denies any SI/HI. PT calm and cooperative. States he did have 40oz beer today

## 2019-01-13 NOTE — ED Notes (Signed)
Hourly rounding reveals patient sleeping in room. No complaints, stable, in no acute distress. Q15 minute rounds and monitoring via Security Cameras to continue. 

## 2019-01-13 NOTE — ED Notes (Signed)
Pt. Transferred to [facility] from ED to room 2 after screening for contraband. Report to include Situation, Background, Assessment and Recommendations from Ann RN. Pt. Oriented to unit including Q15 minute rounds as well as the security cameras for their protection. Patient is alert and oriented, warm and dry in no acute distress. Patient denies SI, HI, and AVH. Pt. Encouraged to let me know if needs arise. 

## 2019-01-13 NOTE — ED Notes (Signed)
IVC, pending Psych consult

## 2019-01-13 NOTE — ED Provider Notes (Signed)
[facility] Emergency Department Provider Note       Time seen: ----------------------------------------- 7:08 PM on 01/13/2019 -----------------------------------------   I have reviewed the triage vital signs and the nursing notes.  HISTORY   Chief Complaint No chief complaint on file.    HPI Dominic [name] is a 43 y.o. male with a history of anxiety, bipolar disorder, depression, schizophrenia who presents to the ED for voluntary commitment.  Patient arrives from home with IVC paperwork.  Police brought him in due to concerns about his behavior.  Police states he has been calm and cooperative.  Patient reports he receives a shot for medication once monthly for his schizophrenia and he has been taking it appropriately, last dose was 2 weeks ago.  He denies any of the allegations against him.  Past Medical History:  Diagnosis Date  . Anxiety   . Asthma   . Bipolar 1 disorder (HCC)   . Chronic hepatitis C without hepatic coma (HCC) 05/28/2018  . Depression   . Hepatitis C   . Hepatitis C antibody positive in blood 05/28/2018  . Schizophrenia (HCC)   . Sleep apnea     Patient Active Problem List   Diagnosis Date Noted  . Asthma 11/25/2018  . Nicotine dependence, cigarettes, uncomplicated 11/25/2018  . Chronic hepatitis C without hepatic coma (HCC) 05/28/2018  . Schizophrenia (HCC) 02/16/2016  . Alcohol abuse 02/16/2016  . Cocaine abuse (HCC) 02/16/2016    Past Surgical History:  Procedure Laterality Date  . CYST REMOVAL NECK     neck    Allergies Patient has no known allergies.  Social History Social History   Tobacco Use  . Smoking status: Current Some Day Smoker    Packs/day: 1.00    Types: Cigarettes  . Smokeless tobacco: Never Used  Substance Use Topics  . Alcohol use: Yes    Alcohol/week: 3.0 standard drinks    Types: 3 Shots of liquor per week  . Drug use: Yes    Types: Cocaine, Marijuana    Comment: last used cocaine  Thursday and MJ use daily   Review of Systems Constitutional: Negative for fever. Cardiovascular: Negative for chest pain. Respiratory: Negative for shortness of breath. Gastrointestinal: Negative for abdominal pain, vomiting and diarrhea. Musculoskeletal: Negative for back pain. Skin: Negative for rash. Neurological: Negative for headaches, focal weakness or numbness. Psychiatric: Negative for suicidal homicidal ideations, negative for hallucinations  All systems negative/normal/unremarkable except as stated in the HPI  ____________________________________________   PHYSICAL EXAM:  VITAL SIGNS: ED Triage Vitals [01/13/19 1839]  Enc Vitals Group     BP (!) 154/86     Pulse Rate 87     Resp 16     Temp 98.2 F (36.8 C)     Temp Source Oral     SpO2 98 %     Weight      Height      Head Circumference      Peak Flow      Pain Score 0     Pain Loc      Pain Edu?      Excl. in GC?    Constitutional: Alert and oriented. Well appearing and in no distress. Eyes: Conjunctivae are normal. Normal extraocular movements. ENT      Head: Normocephalic and atraumatic.      Nose: No congestion/rhinnorhea.      Mouth/Throat: Mucous membranes are moist.      Neck: No stridor. Cardiovascular: Normal rate, regular rhythm.  No murmurs, rubs, or gallops. Respiratory: Normal respiratory effort without tachypnea nor retractions. Breath sounds are clear and equal bilaterally. No wheezes/rales/rhonchi. Gastrointestinal: Soft and nontender. Normal bowel sounds Musculoskeletal: Nontender with normal range of motion in extremities. No lower extremity tenderness nor edema. Neurologic:  Normal speech and language. No gross focal neurologic deficits are appreciated.  Skin:  Skin is warm, dry and intact. No rash noted. Psychiatric: Mood and affect are normal. Speech and behavior are normal.  ____________________________________________  ED COURSE:  As part of my medical decision making, I  reviewed the following data within the electronic MEDICAL RECORD NUMBER History obtained from family if available, nursing notes, old chart and ekg, as well as notes from prior ED visits. Patient presented for involuntary commitment, we will assess with labs and reevaluate.   Procedures ____________________________________________   LABS (pertinent positives/negatives)  Labs Reviewed  CBC - Abnormal; Notable for the following components:      Result Value   MCHC 36.3 (*)    All other components within normal limits  COMPREHENSIVE METABOLIC PANEL  ETHANOL  URINE DRUG SCREEN, QUALITATIVE ([facility] ONLY)  ____________________________________________   DIFFERENTIAL DIAGNOSIS   Involuntary commitment, medication noncompliance, schizophrenia  FINAL ASSESSMENT AND PLAN  Schizophrenia   Plan: The patient had presented for involuntary commitment. Patient's labs were unremarkable although we still did not have a tox screen.  It is difficult to tell if that he has been compliant or not.  He has been agitated at times.  We will observe overnight and confirm his information with [facility] tomorrow.   Ulice Dash, MD    Note: This note was generated in part or whole with voice recognition software. Voice recognition is usually quite accurate but there are transcription errors that can and very often do occur. I apologize for any typographical errors that were not detected and corrected.     Emily Filbert, MD 01/13/19 2251

## 2019-01-13 NOTE — ED Notes (Signed)
Pt given food tray along with ginger ale. No other needs voiced at this time. Will continue to monitor Q15 minute rounds.
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
## 2019-01-14 ENCOUNTER — Other Ambulatory Visit: Payer: Self-pay

## 2019-01-14 ENCOUNTER — Inpatient Hospital Stay
Admission: RE | Admit: 2019-01-14 | Discharge: 2019-01-15 | DRG: 885 | Disposition: A | Discharge status: Home / Self Care | Payer: Medicaid Other | Source: Intra-hospital | Attending: Psychiatry | Admitting: Psychiatry

## 2019-01-14 ENCOUNTER — Ambulatory Visit: Payer: Self-pay | Admitting: Gastroenterology

## 2019-01-14 ENCOUNTER — Encounter: Payer: Self-pay | Admitting: Gastroenterology

## 2019-01-14 DIAGNOSIS — F209 Schizophrenia, unspecified: Principal | ICD-10-CM | POA: Diagnosis present

## 2019-01-14 DIAGNOSIS — Z818 Family history of other mental and behavioral disorders: Secondary | ICD-10-CM | POA: Diagnosis not present

## 2019-01-14 DIAGNOSIS — F1210 Cannabis abuse, uncomplicated: Secondary | ICD-10-CM | POA: Diagnosis present

## 2019-01-14 DIAGNOSIS — F1010 Alcohol abuse, uncomplicated: Secondary | ICD-10-CM | POA: Diagnosis present

## 2019-01-14 DIAGNOSIS — R451 Restlessness and agitation: Secondary | ICD-10-CM | POA: Diagnosis present

## 2019-01-14 DIAGNOSIS — F319 Bipolar disorder, unspecified: Secondary | ICD-10-CM | POA: Diagnosis present

## 2019-01-14 DIAGNOSIS — G4730 Sleep apnea, unspecified: Secondary | ICD-10-CM | POA: Diagnosis present

## 2019-01-14 DIAGNOSIS — G4700 Insomnia, unspecified: Secondary | ICD-10-CM | POA: Diagnosis present

## 2019-01-14 DIAGNOSIS — B182 Chronic viral hepatitis C: Secondary | ICD-10-CM | POA: Diagnosis present

## 2019-01-14 DIAGNOSIS — F1410 Cocaine abuse, uncomplicated: Secondary | ICD-10-CM | POA: Diagnosis present

## 2019-01-14 LAB — URINE DRUG SCREEN, QUALITATIVE (ARMC ONLY)
AMPHETAMINES, UR SCREEN: NOT DETECTED
Barbiturates, Ur Screen: NOT DETECTED
Benzodiazepine, Ur Scrn: NOT DETECTED
Cannabinoid 50 Ng, Ur ~~LOC~~: POSITIVE — AB
Cocaine Metabolite,Ur ~~LOC~~: POSITIVE — AB
MDMA (Ecstasy)Ur Screen: NOT DETECTED
Methadone Scn, Ur: NOT DETECTED
Opiate, Ur Screen: NOT DETECTED
Phencyclidine (PCP) Ur S: NOT DETECTED
TRICYCLIC, UR SCREEN: NOT DETECTED

## 2019-01-14 MED ORDER — TRAZODONE HCL 100 MG PO TABS: 100.0000 mg | ORAL_TABLET | Freq: Every evening | ORAL | 0 refills | Status: DC | PRN

## 2019-01-14 MED ORDER — TRAZODONE HCL 100 MG PO TABS
100.0000 mg | ORAL_TABLET | Freq: Every evening | ORAL | Status: DC | PRN
  Filled 2019-01-14: qty 1

## 2019-01-14 MED ORDER — NICOTINE 21 MG/24HR TD PT24
21.0000 mg | MEDICATED_PATCH | Freq: Every day | TRANSDERMAL | Status: DC
  Administered 2019-01-14 – 2019-01-15 (×2): 21 mg via TRANSDERMAL
  Filled 2019-01-14 (×2): qty 1

## 2019-01-14 MED ORDER — ALUM & MAG HYDROXIDE-SIMETH 200-200-20 MG/5ML PO SUSP: 30.0000 mL | ORAL | Status: DC | PRN

## 2019-01-14 MED ORDER — MAGNESIUM HYDROXIDE 400 MG/5ML PO SUSP: 30.0000 mL | Freq: Every day | ORAL | Status: DC | PRN

## 2019-01-14 MED ORDER — ACETAMINOPHEN 325 MG PO TABS: 650.0000 mg | ORAL_TABLET | Freq: Four times a day (QID) | ORAL | Status: DC | PRN
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
## 2019-01-14 NOTE — ED Notes (Signed)
Hourly rounding reveals patient sleeping in room. No complaints, stable, in no acute distress. Q15 minute rounds and monitoring via Security Cameras to continue. 

## 2019-01-14 NOTE — Progress Notes (Signed)
Recreation Therapy Notes  Date: 01/14/2019  Time: 9:30 am   Location: Craft room   Behavioral response: N/A   Intervention Topic: Team work  Discussion/Intervention: Patient did not attend group.   Clinical Observations/Feedback:  Patient did not attend group.   Sumner Boesch LRT/CTRS        Lorin Gawron 01/14/2019 10:52 AM

## 2019-01-14 NOTE — H&P (Signed)
Psychiatric Admission Assessment Adult  Patient Identification: Dominic [name] MRN:  [id number] Date of Evaluation:  01/14/2019 Chief Complaint:  schizophrenia Principal Diagnosis: Schizophrenia, chronic condition (HCC) Diagnosis:  Principal Problem:   Schizophrenia, chronic condition (HCC) Active Problems:   Alcohol abuse   Cocaine abuse (HCC)  History of Present Illness: This is a patient with a history of schizophrenia and alcohol abuse brought into the hospital under IVC after being picked up for agitation in public.  Patient interviewed chart reviewed.  Also spoke with representative from his act team by telephone.  Patient states that he got his shot of Abilify 2 weeks ago.  I confirmed that that is true speaking to his act team representative.  Patient admits that he got into an argument with his girlfriend.  He thinks that she called the law on him just out of spite.  Testimony however from others including his family indicates that he has been agitated.  Probably had not slept in days.  He made some comments to his act team worker about how he was going going to burn down his girlfriends or his sister's house if he found out that she was "lying" about him.  Patient to me denies any auditory or visual hallucinations.  Denies having made any threatening statements.  Denies suicidal or homicidal ideation.  He admits that he used "a little" crack cocaine and that he has been drinking a couple of 40s most days.  He minimizes all this however.  Insist that he is not threatening to himself or anyone else.  Denies hallucinations or other psychotic symptoms. Associated Signs/Symptoms: Depression Symptoms:  insomnia, (Hypo) Manic Symptoms:  Distractibility, Anxiety Symptoms:  None Psychotic Symptoms:  Paranoia, PTSD Symptoms: Negative Total Time spent with patient: 1 hour  Past Psychiatric History: Patient has a past history of schizophrenia and alcohol abuse.  He denies ever having tried to  kill himself in the past.  There has been some threatening behavior in the past but he denies any serious violence.  Most of his problematic behavior has been the result of substance abuse on top of the paranoia that he has when he is not on his shot.  He does have a past history of hallucinations.  Is the patient at risk to self? No.  Has the patient been a risk to self in the past 6 months? No.  Has the patient been a risk to self within the distant past? Yes.    Is the patient a risk to others? Yes.    Has the patient been a risk to others in the past 6 months? Yes.    Has the patient been a risk to others within the distant past? Yes.     Prior Inpatient Therapy:   Prior Outpatient Therapy:    Alcohol Screening: 1. How often do you have a drink containing alcohol?: 2 to 4 times a month 2. How many drinks containing alcohol do you have on a typical day when you are drinking?: 3 or 4 3. How often do you have six or more drinks on one occasion?: Less than monthly AUDIT-C Score: 4 4. How often during the last year have you found that you were not able to stop drinking once you had started?: Less than monthly 5. How often during the last year have you failed to do what was normally expected from you becasue of drinking?: Less than monthly 6. How often during the last year have you needed a first drink in  the morning to get yourself going after a heavy drinking session?: Less than monthly 7. How often during the last year have you had a feeling of guilt of remorse after drinking?: Less than monthly 8. How often during the last year have you been unable to remember what happened the night before because you had been drinking?: Less than monthly 9. Have you or someone else been injured as a result of your drinking?: No 10. Has a relative or friend or a doctor or another health worker been concerned about your drinking or suggested you cut down?: No Alcohol Use Disorder Identification Test Final  Score (AUDIT): 9 Alcohol Brief Interventions/Follow-up: Alcohol Education Substance Abuse History in the last 12 months:  Yes.   Consequences of Substance Abuse: Medical Consequences:  Worsening problems with his mental illness Previous Psychotropic Medications: Yes  Psychological Evaluations: Yes  Past Medical History:  Past Medical History:  Diagnosis Date  . Anxiety   . Asthma   . Bipolar 1 disorder (HCC)   . Chronic hepatitis C without hepatic coma (HCC) 05/28/2018  . Depression   . Hepatitis C   . Hepatitis C antibody positive in blood 05/28/2018  . Schizophrenia (HCC)   . Sleep apnea     Past Surgical History:  Procedure Laterality Date  . CYST REMOVAL NECK     neck   Family History:  Family History  Problem Relation Age of Onset  . Hypertension Sister   . Schizophrenia Sister    Family Psychiatric  History: None known Tobacco Screening: Have you used any form of tobacco in the last 30 days? (Cigarettes, Smokeless Tobacco, Cigars, and/or Pipes): Yes Tobacco use, Select all that apply: 5 or more cigarettes per day Are you interested in Tobacco Cessation Medications?: No, patient refused Counseled patient on smoking cessation including recognizing danger situations, developing coping skills and basic information about quitting provided: Refused/Declined practical counseling Social History:  Social History   Substance and Sexual Activity  Alcohol Use Yes  . Alcohol/week: 3.0 standard drinks  . Types: 3 Shots of liquor per week     Social History   Substance and Sexual Activity  Drug Use Yes  . Types: Cocaine, Marijuana   Comment: last used cocaine Thursday and MJ use daily    Additional Social History:                           Allergies:  No Known Allergies Lab Results:  Results for orders placed or performed during the hospital encounter of 01/13/19 (from the past 48 hour(s))  Comprehensive metabolic panel     Status: Abnormal   Collection Time:  01/13/19  6:42 PM  Result Value Ref Range   Sodium 137 135 - 145 mmol/L   Potassium 3.4 (L) 3.5 - 5.1 mmol/L   Chloride 106 98 - 111 mmol/L   CO2 23 22 - 32 mmol/L   Glucose, Bld 122 (H) 70 - 99 mg/dL   BUN 15 6 - 20 mg/dL   Creatinine, Ser 8.29 0.61 - 1.24 mg/dL   Calcium 8.4 (L) 8.9 - 10.3 mg/dL   Total Protein 7.6 6.5 - 8.1 g/dL   Albumin 4.1 3.5 - 5.0 g/dL   AST 44 (H) 15 - 41 U/L   ALT 50 (H) 0 - 44 U/L   Alkaline Phosphatase 36 (L) 38 - 126 U/L   Total Bilirubin 0.6 0.3 - 1.2 mg/dL   GFR calc non Af Amer >  60 >60 mL/min   GFR calc Af Amer >60 >60 mL/min   Anion gap 8 5 - 15    Comment: Performed at [facility], [address] [place], [place] [address]  Ethanol     Status: None   Collection Time: 01/13/19  6:42 PM  Result Value Ref Range   Alcohol, Ethyl (B) <10 <10 mg/dL    Comment: (NOTE) Lowest detectable limit for serum alcohol is 10 mg/dL. For medical purposes only. Performed at [facility], [address] [place], [place] [address]   cbc     Status: Abnormal   Collection Time: 01/13/19  6:42 PM  Result Value Ref Range   WBC 7.4 4.0 - 10.5 K/uL   RBC 4.97 4.22 - 5.81 MIL/uL   Hemoglobin 14.7 13.0 - 17.0 g/dL   HCT 09.840.5 11.939.0 - 14.752.0 %   MCV 81.5 80.0 - 100.0 fL   MCH 29.6 26.0 - 34.0 pg   MCHC 36.3 (H) 30.0 - 36.0 g/dL   RDW 82.913.6 56.211.5 - 13.015.5 %   Platelets 202 150 - 400 K/uL   nRBC 0.0 0.0 - 0.2 %    Comment: Performed at [facility], [address] [place], [place] [address]    Blood Alcohol level:  Lab Results  Component Value Date   [facility] 01/13/2019   ETH <10 04/08/2018    Metabolic Disorder Labs:  Lab Results  Component Value Date   HGBA1C 5.3 04/08/2018   MPG 105.41 04/08/2018   No results found for: PROLACTIN Lab Results  Component Value Date   CHOL 150 04/08/2018   TRIG 126 04/08/2018   HDL 44 04/08/2018   CHOLHDL 3.4 04/08/2018   VLDL 25 04/08/2018   LDLCALC 81 04/08/2018    Current  Medications: Current Facility-Administered Medications  Medication Dose Route Frequency Provider Last Rate Last Dose  . acetaminophen (TYLENOL) tablet 650 mg  650 mg Oral Q6H PRN Catalina Gravelhomspon, Jacqueline, NP      . alum & mag hydroxide-simeth (MAALOX/MYLANTA) 200-200-20 MG/5ML suspension 30 mL  30 mL Oral Q4H PRN Thomspon, Adela LankJacqueline, NP      . magnesium hydroxide (MILK OF MAGNESIA) suspension 30 mL  30 mL Oral Daily PRN Thomspon, Adela LankJacqueline, NP      . nicotine (NICODERM CQ - dosed in mg/24 hours) patch 21 mg  21 mg Transdermal Q0600 Catalina Gravelhomspon, Jacqueline, NP   21 mg at 01/14/19 0835  . traZODone (DESYREL) tablet 100 mg  100 mg Oral QHS PRN Catalina Gravelhomspon, Jacqueline, NP       PTA Medications: Medications Prior to Admission  Medication Sig Dispense Refill Last Dose  . albuterol (PROVENTIL HFA;VENTOLIN HFA) 108 (90 Base) MCG/ACT inhaler Inhale into the lungs every 6 (six) hours as needed for wheezing or shortness of breath.   prn at prn  . ARIPiprazole ER (ABILIFY MAINTENA) 400 MG SRER injection Inject 2 mLs (400 mg total) into the muscle every 28 (twenty-eight) days. Next injection due 05/08/18 1 each 0 unknown at unknown  . Multiple Vitamins-Minerals (MULTIVITAMIN WITH MINERALS) tablet Take 1 tablet by mouth daily.   unknown at unknown  . traZODone (DESYREL) 50 MG tablet Take 50 mg by mouth at bedtime.   unknown at unknown    Musculoskeletal: Strength & Muscle Tone: within normal limits Gait & Station: normal Patient leans: N/A  Psychiatric Specialty Exam: Physical Exam  Nursing note and vitals reviewed. Constitutional: He appears well-developed and well-nourished.  HENT:  Head: Normocephalic and atraumatic.  Eyes: Pupils are equal, round, and reactive to light. Conjunctivae are normal.  Neck: Normal range of motion.  Cardiovascular: Regular rhythm and normal heart sounds.  Respiratory: Effort normal. No respiratory distress.  GI: Soft.  Musculoskeletal: Normal range of motion.  Neurological:  He is alert.  Skin: Skin is warm and dry.  Psychiatric: His speech is normal. His mood appears anxious. He is not agitated and not aggressive. Thought content is not paranoid. He expresses inappropriate judgment. He expresses no homicidal and no suicidal ideation. He exhibits abnormal recent memory.    Review of Systems  Constitutional: Negative.   HENT: Negative.   Eyes: Negative.   Respiratory: Negative.   Cardiovascular: Negative.   Gastrointestinal: Negative.   Musculoskeletal: Negative.   Skin: Negative.   Neurological: Negative.   Psychiatric/Behavioral: Positive for memory loss and substance abuse. Negative for depression, hallucinations and suicidal ideas. The patient is not nervous/anxious.     Blood pressure 113/68, pulse (!) 56, temperature 98 F (36.7 C), temperature source Oral, resp. rate 18, height 5\' 10"  (1.778 m), weight 95.3 kg, SpO2 99 %.Body mass index is 30.13 kg/m.  General Appearance: Casual  Eye Contact:  Fair  Speech:  Clear and Coherent  Volume:  Normal  Mood:  Euthymic  Affect:  Constricted  Thought Process:  Goal Directed  Orientation:  Full (Time, Place, and Person)  Thought Content:  Logical  Suicidal Thoughts:  No  Homicidal Thoughts:  No  Memory:  Immediate;   Fair Recent;   Fair Remote;   Fair  Judgement:  Fair  Insight:  Fair  Psychomotor Activity:  Decreased  Concentration:  Concentration: Fair  Recall:  [employer] of Knowledge:  Fair  Language:  Fair  Akathisia:  No  Handed:  Right  AIMS (if indicated):     Assets:  Desire for Improvement Financial Resources/Insurance Housing  ADL's:  Intact  Cognition:  WNL  Sleep:  Number of Hours: 4    Treatment Plan Summary: Daily contact with patient to assess and evaluate symptoms and progress in treatment, Medication management and Plan No indication to add any antipsychotic medicine as he recently did have his injection.  Monitor for any signs of detox.  Patient will be engaged in  individual and group therapy.  I spoke with his acting representative who is concerned about his recent behavior.  A lot of it however seems to be related to intoxication.  Try to get the patient to improve some of his insight as he really minimizes the problems that he is going through.  Continue making sure that he has safe behavior and a good outpatient plan before we discharge him  Observation Level/Precautions:  15 minute checks  Laboratory:  UDS  Psychotherapy:    Medications:    Consultations:    Discharge Concerns:    Estimated LOS:  Other:     Physician Treatment Plan for Primary Diagnosis: Schizophrenia, chronic condition (HCC) Long Term Goal(s): Improvement in symptoms so as ready for discharge  Short Term Goals: Ability to disclose and discuss suicidal ideas and Ability to demonstrate self-control will improve  Physician Treatment Plan for Secondary Diagnosis: Principal Problem:   Schizophrenia, chronic condition (HCC) Active Problems:   Alcohol abuse   Cocaine abuse (HCC)  Long Term Goal(s): Improvement in symptoms so as ready for discharge  Short Term Goals: Compliance with prescribed medications will improve and Ability to identify triggers associated with substance abuse/mental health issues will improve  I certify that inpatient  services furnished can reasonably be expected to improve the patient's condition.    Mordecai Rasmussen, MD 3/3/20201:33 PM

## 2019-01-14 NOTE — Plan of Care (Signed)
Patient newly admitted to the unit, hasn't had time to progress.   Problem: Education: Goal: Ability to state activities that reduce stress will improve Outcome: Not Progressing   Problem: Self-Concept: Goal: Ability to identify factors that promote anxiety will improve Outcome: Not Progressing Goal: Level of anxiety will decrease Outcome: Not Progressing Goal: Ability to modify response to factors that promote anxiety will improve Outcome: Not Progressing   Problem: Education: Goal: Utilization of techniques to improve thought processes will improve Outcome: Not Progressing Goal: Knowledge of the prescribed therapeutic regimen will improve Outcome: Not Progressing   Problem: Activity: Goal: Interest or engagement in leisure activities will improve Outcome: Not Progressing Goal: Imbalance in normal sleep/wake cycle will improve Outcome: Not Progressing
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
## 2019-01-14 NOTE — BHH Counselor (Signed)
CSW left VM for Marchelle Folks a member of the pts ACT Team @ [phone].

## 2019-01-14 NOTE — BHH Group Notes (Signed)
Feelings Around Diagnosis 01/14/2019 1PM  Type of Therapy/Topic:  Group Therapy:  Feelings about Diagnosis  Participation Level:  Did Not Attend   Description of Group:   This group will allow patients to explore their thoughts and feelings about diagnoses they have received. Patients will be guided to explore their level of understanding and acceptance of these diagnoses. Facilitator will encourage patients to process their thoughts and feelings about the reactions of others to their diagnosis and will guide patients in identifying ways to discuss their diagnosis with significant others in their lives. This group will be process-oriented, with patients participating in exploration of their own experiences, giving and receiving support, and processing challenge from other group members.   Therapeutic Goals: 1. Patient will demonstrate understanding of diagnosis as evidenced by identifying two or more symptoms of the disorder 2. Patient will be able to express two feelings regarding the diagnosis 3. Patient will demonstrate their ability to communicate their needs through discussion and/or role play  Summary of Patient Progress:       Therapeutic Modalities:   Cognitive Behavioral Therapy Brief Therapy Feelings Identification    Suzan Slick, LCSW 01/14/2019 2:27 PM

## 2019-01-14 NOTE — BHH Suicide Risk Assessment (Signed)
[facility] Discharge Suicide Risk Assessment   Principal Problem: Schizophrenia, chronic condition (HCC) Discharge Diagnoses: Principal Problem:   Schizophrenia, chronic condition (HCC) Active Problems:   Alcohol abuse   Cocaine abuse (HCC)   Total Time spent with patient: 45 minutes  Musculoskeletal: Strength & Muscle Tone: within normal limits Gait & Station: normal Patient leans: N/A  Psychiatric Specialty Exam: Review of Systems  Constitutional: Negative.   HENT: Negative.   Eyes: Negative.   Respiratory: Negative.   Cardiovascular: Negative.   Gastrointestinal: Negative.   Musculoskeletal: Negative.   Skin: Negative.   Neurological: Negative.   Psychiatric/Behavioral: Negative.     Blood pressure 113/68, pulse (!) 56, temperature 98 F (36.7 C), temperature source Oral, resp. rate 18, height 5\' 10"  (1.778 m), weight 95.3 kg, SpO2 99 %.Body mass index is 30.13 kg/m.  General Appearance: Fairly Groomed  Patent attorney::  Fair  Speech:  Normal [id number]  Volume:  Normal  Mood:  Euthymic  Affect:  Congruent  Thought Process:  Goal Directed  Orientation:  Full (Time, Place, and Person)  Thought Content:  Logical  Suicidal Thoughts:  No  Homicidal Thoughts:  No  Memory:  Immediate;   Fair Recent;   Fair Remote;   Fair  Judgement:  Fair  Insight:  Shallow  Psychomotor Activity:  Normal  Concentration:  Fair  Recall:  [employer] of Knowledge:Fair  Language: Fair  Akathisia:  No  Handed:  Right  AIMS (if indicated):     Assets:  Desire for Improvement Housing Physical Health Social Support  Sleep:  Number of Hours: 4  Cognition: WNL  ADL's:  Intact   Mental Status Per Nursing Assessment::   On Admission:  NA  Demographic Factors:  Male  Loss Factors: Loss of significant relationship  Historical Factors: Impulsivity  Risk Reduction Factors:   Religious beliefs about death, Positive social support and Positive therapeutic relationship  Continued Clinical  Symptoms:  Alcohol/Substance Abuse/Dependencies Schizophrenia:   Paranoid or undifferentiated type  Cognitive Features That Contribute To Risk:  Loss of executive function    Suicide Risk:  Minimal: No identifiable suicidal ideation.  Patients presenting with no risk factors but with morbid ruminations; may be classified as minimal risk based on the severity of the depressive symptoms  Follow-up Information    [employer] Go on 01/22/2019.   Why:  Please follow up at [facility] on Wednesday, January 22, 2019 at 930am. Thank you. Contact information: [address] [place] [address] [phone]           Plan Of Care/Follow-up recommendations:  Activity:  Activity as tolerated Diet:  Regular diet Other:  Follow-up with outpatient act team  Mordecai Rasmussen, MD 01/14/2019, 4:59 PM

## 2019-01-14 NOTE — Progress Notes (Signed)
D - Patient received from [facility] Emergency Department at 0100. Report received from Jillyn Hidden, [place]. Skin assessment completed Alex, RN, no abnormalities found. No contraband found. Patient was pleasant during assessment. Patient denies SI/HI/AVH, pain, anxiety and depression. Patient stated, "I am here because I broke up with my girlfriend and she had me committed. I see a therapist on my own and I take my medications like I am supposed to do." Patient presented calm and pleasant. Patient oriented to the unit and given snack.   A - Patient given education. Patient given support and encouragement to be active in his treatment plan. Patient informed to let staff know if there are any issues or problems on the unit.   R - Patient being monitored Q 15 minutes for safety. Patient remains safe on the unit.

## 2019-01-14 NOTE — Tx Team (Signed)
Initial Treatment Plan 01/14/2019 2:16 AM Dominic [name] DUK:[id number]    PATIENT STRESSORS: Marital or family conflict Medication change or noncompliance   PATIENT STRENGTHS: Motivation for treatment/growth Supportive family/friends   PATIENT IDENTIFIED PROBLEMS: Suicidal Ideation  Anxiety  Psychosis                 DISCHARGE CRITERIA:  Motivation to continue treatment in a less acute level of care Verbal commitment to aftercare and medication compliance  PRELIMINARY DISCHARGE PLAN: Outpatient therapy Return to previous living arrangement  PATIENT/FAMILY INVOLVEMENT: This treatment plan has been presented to and reviewed with the patient, [name]. The patient has been given the opportunity to ask questions and make suggestions.  Elmyra Ricks, RN 01/14/2019, 2:16 AM

## 2019-01-14 NOTE — BHH Suicide Risk Assessment (Signed)
BHH INPATIENT:  Family/Significant Other Suicide Prevention Education  Suicide Prevention Education:  Patient Refusal for Family/Significant Other Suicide Prevention Education: The patient Dominic [name] has refused to provide written consent for family/significant other to be provided Family/Significant Other Suicide Prevention Education during admission and/or prior to discharge.  Physician notified.  Nuvia Hileman T Jamielynn Wigley 01/14/2019, 10:23 AM
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
## 2019-01-14 NOTE — Plan of Care (Signed)
Problem: Education: Goal: Ability to state activities that reduce stress will improve Outcome: Progressing   Problem: Self-Concept: Goal: Level of anxiety will decrease Outcome: Progressing   Problem: Coping: Goal: Ability to identify and develop effective coping behavior will improve Outcome: Progressing DAR Note: Pt A & O X4. Presents animated and calm on interactions. Denies SI, HI, AVH and pain when assessed "I don't want to hurt myself or anyone else; my girlfriend put me in here out of spite because I broke up with her; I'm alright". Reports he slept well last night with good appetite, normal energy and good concentration level. Rates his depression, anxiety and hopelessness all 1/10 on self inventory sheet. Observed asleep in bed at intervals during shift.  Emotional support and reassurance offered to pt as needed. Nicotine patch applied to left arm with verbal education and effects monitored. Safety checks maintained without self harm or aggressive gestures towards others. Pt encouraged to voice concerns, attend to ADLs and comply with current treatment regimen including groups.  Visible in milieu at intervals, getting his needs met safely on unit. Pt did not attend scheduled groups despite multiple prompts.  POC continues for safety and mood stability.

## 2019-01-15 NOTE — Progress Notes (Signed)
Patient alert and oriented x 4, affect is flat but brightens upon approach, thoughts are organized no bizarre behavior noted, noted interacting appropriately with peers and staff on the unit. Patient currently denies SI/HI/AVH, rated depression a 4/10 no distress noted, 15 minutes safety checks maintained will continue to monitor.

## 2019-01-15 NOTE — Progress Notes (Signed)
D- Patient alert and oriented. Patient presents in a pleasant mood on assessment stating that he slept "great" last night and had no major complaints to voice to this Clinical research associate. Patient denies SI, HI, AVH, and pain at this time. Patient also denies any signs/symtpoms of depression and anxiety to this Clinical research associate. Patient's goal for today is to "spend time with my little girl".  A- Support and encouragement provided. Routine safety checks conducted every 15 minutes.  Patient informed to notify staff with problems or concerns.  R- Patient contracts for safety at this time. Patient receptive, calm, and cooperative. Patient interacts well with others on the unit.  Patient remains safe at this time.

## 2019-01-15 NOTE — Progress Notes (Signed)
Recreation Therapy Notes  Date: 01/15/2019  Time: 9:30 am   Location: Craft room   Behavioral response: N/A   Intervention Topic: Coping Skills  Discussion/Intervention: Patient did not attend group.   Clinical Observations/Feedback:  Patient did not attend group.   Hoy Fallert LRT/CTRS         Coumba Kellison 01/15/2019 11:38 AM

## 2019-01-15 NOTE — Progress Notes (Signed)
Patient ID: Dominic [name], male   DOB: January 27, 1976, 43 y.o.   MRN: [id number]   Discharge Note:  Patient denies SI/HI/AVH at this time. Discharge instructions, AVS, prescriptions, and transition record gone over with patient. Patient agrees to comply with medication management, follow-up visit, and outpatient therapy. Patient belongings returned to patient. Patient questions and concerns addressed and answered. Patient ambulatory off unit. Patient discharged to home with sister.

## 2019-01-15 NOTE — Progress Notes (Signed)
  [facility] Adult Case Management Discharge Plan :  Will you be returning to the same living situation after discharge:  Yes,  pt lives with family members At discharge, do you have transportation home?: Yes,  pt sister will provide transportation Do you have the ability to pay for your medications: Yes,  insurance  Release of information consent forms completed and in the chart;  Patient's signature needed at discharge.  Patient to Follow up at: Follow-up Information    [facility] Go on 01/22/2019.   Why:  Please follow up at [facility] on Wednesday, January 22, 2019 at 930am. Thank you. Contact information: [address] [place] [address] [phone]           Next level of care provider has access to [facility] Link:no  Safety Planning and Suicide Prevention discussed: Yes,  with pt; pt declined family contact  Have you used any form of tobacco in the last 30 days? (Cigarettes, Smokeless Tobacco, Cigars, and/or Pipes): Yes  Has patient been referred to the Quitline?: N/A patient is not a smoker  Patient has been referred for addiction treatment: N/A  Suzan Slick, LCSW 01/15/2019, 10:07 AM

## 2019-01-15 NOTE — BHH Counselor (Signed)
Amanda(pt's peer support at [employer] [place] [phone]) contacted CSW. Marchelle Folks is not a part of [facility]. She states she has been working with the pt for one year and is aware of the conflict between him and his significant other. She reports the pt told her that he will burn his sister's house down if he finds out that she lied about his wife not being in the home. Marchelle Folks states being concerned that if released from the hospital the pt will follow through on the threat. She states the pt and his significant other have a toxic relationship. Marchelle Folks says she would like the pt to go a residential tx program. CSW informed her the pt is his own legal guardian and has the right to make decisions for himself.

## 2019-01-15 NOTE — Discharge Summary (Signed)
Physician Discharge Summary Note  Patient:  Lindaann SloughCedric Deon Maciver is an 43 y.o., male MRN:  [id number] DOB:  17-Mar-1976 Patient phone:  [phone] (home)  Patient address:   [address] [place] [id number],  Total Time spent with patient: 45 minutes  Date of Admission:  01/14/2019 Date of Discharge: January 15, 2019  Reason for Admission: Patient was admitted after presenting under IVC filed because of alleged hood threatening behavior.  Patient admitted to recent use of alcohol and cocaine.  On first presentation when I saw him the patient was no longer intoxicated.  He was calm appropriate and lucid.  Patient stated that he had gotten his long-acting antipsychotic injection 2 weeks ago which I checked out and is true.  He did not display any dangerous or aggressive behavior in the hospital.  He was not threatening.  He did not make any threats towards anyone outside the hospital.  Denied having any homicidal or violent ideation towards anyone including his family.  Denied any suicidal thoughts.  Patient appears to have had transient behavior problems related to substance abuse.  When sober he appears to be back at his baseline without overt psychosis.  Principal Problem: Schizophrenia, chronic condition [facility](HCC) Discharge Diagnoses: Principal Problem:   Schizophrenia, chronic condition (HCC) Active Problems:   Alcohol abuse   Cocaine abuse (HCC)   Past Psychiatric History: Patient has a history of schizophrenia and substance abuse  Past Medical History:  Past Medical History:  Diagnosis Date  . Anxiety   . Asthma   . Bipolar 1 disorder (HCC)   . Chronic hepatitis C without hepatic coma (HCC) 05/28/2018  . Depression   . Hepatitis C   . Hepatitis C antibody positive in blood 05/28/2018  . Schizophrenia (HCC)   . Sleep apnea     Past Surgical History:  Procedure Laterality Date  . CYST REMOVAL NECK     neck   Family History:  Family History  Problem Relation Age of Onset  .  Hypertension Sister   . Schizophrenia Sister    Family Psychiatric  History: See previous Social History:  Social History   Substance and Sexual Activity  Alcohol Use Yes  . Alcohol/week: 3.0 standard drinks  . Types: 3 Shots of liquor per week     Social History   Substance and Sexual Activity  Drug Use Yes  . Types: Cocaine, Marijuana   Comment: last used cocaine Thursday and MJ use daily    Social History   Socioeconomic History  . Marital status: Single    Spouse name: Not on file  . Number of children: Not on file  . Years of education: Not on file  . Highest education level: Not on file  Occupational History  . Not on file  Social Needs  . Financial resource strain: Not very hard  . Food insecurity:    Worry: Never true    Inability: Never true  . Transportation needs:    Medical: No    Non-medical: No  Tobacco Use  . Smoking status: Current Some Day Smoker    Packs/day: 1.00    Types: Cigarettes  . Smokeless tobacco: Never Used  Substance and Sexual Activity  . Alcohol use: Yes    Alcohol/week: 3.0 standard drinks    Types: 3 Shots of liquor per week  . Drug use: Yes    Types: Cocaine, Marijuana    Comment: last used cocaine Thursday and MJ use daily  . Sexual activity: Not Currently  Lifestyle  . Physical activity:    Days per week: 0 days    Minutes per session: 0 min  . Stress: Only a little  Relationships  . Social connections:    Talks on phone: Twice a week    Gets together: Twice a week    Attends religious service: Never    Active member of club or organization: No    Attends meetings of clubs or organizations: Never    Relationship status: Patient refused  Other Topics Concern  . Not on file  Social History Narrative  . Not on file    Hospital Course: Patient was calm throughout the hospital stay.  15-minute checks employed.  Patient was compliant with treatment.  No threats no aggression no violence.  Denied suicidal or homicidal  ideation.  Did not show paranoia or disorganized thinking.  Patient was counseled about the importance of maintaining sobriety.  He was counseled about the way that substance abuse could cause his behavior to change without him intending it.  He expressed understanding of this and agrees to continued follow-up in the community.  He has been seen by the representative from [facility] and strongly encouraged to get involved with substance abuse treatment there as well as continuing medicine management.  Physical Findings: AIMS: Facial and Oral Movements Muscles of Facial Expression: None, normal Lips and Perioral Area: None, normal Jaw: None, normal Tongue: None, normal,Extremity Movements Upper (arms, wrists, hands, fingers): None, normal Lower (legs, knees, ankles, toes): None, normal, Trunk Movements Neck, shoulders, hips: None, normal, Overall Severity Severity of abnormal movements (highest score from questions above): None, normal Incapacitation due to abnormal movements: None, normal Patient's awareness of abnormal movements (rate only patient's report): No Awareness, Dental Status Current problems with teeth and/or dentures?: No Does patient usually wear dentures?: No  CIWA:  CIWA-Ar Total: 2 COWS:  COWS Total Score: 2  Musculoskeletal: Strength & Muscle Tone: within normal limits Gait & Station: normal Patient leans: N/A  Psychiatric Specialty Exam: Physical Exam  Nursing note and vitals reviewed. Constitutional: He appears well-developed and well-nourished.  HENT:  Head: Normocephalic and atraumatic.  Eyes: Pupils are equal, round, and reactive to light. Conjunctivae are normal.  Neck: Normal range of motion.  Cardiovascular: Regular rhythm and normal heart sounds.  Respiratory: Effort normal. No respiratory distress.  GI: Soft.  Musculoskeletal: Normal range of motion.  Neurological: He is alert.  Skin: Skin is warm and dry.  Psychiatric: He has a normal mood and affect. His  speech is normal and behavior is normal. Judgment normal. Thought content is not paranoid. He expresses no homicidal and no suicidal ideation. He exhibits abnormal recent memory.    Review of Systems  Constitutional: Negative.   HENT: Negative.   Eyes: Negative.   Respiratory: Negative.   Cardiovascular: Negative.   Gastrointestinal: Negative.   Musculoskeletal: Negative.   Skin: Negative.   Neurological: Negative.   Psychiatric/Behavioral: Negative.     Blood pressure (!) 146/82, pulse (!) 52, temperature 98.1 F (36.7 C), temperature source Oral, resp. rate 18, height 5\' 10"  (1.778 m), weight 95.3 kg, SpO2 99 %.Body mass index is 30.13 kg/m.  General Appearance: Fairly Groomed  Eye Contact:  Good  Speech:  Clear and Coherent  Volume:  Normal  Mood:  Euthymic  Affect:  Congruent  Thought Process:  Goal Directed  Orientation:  Full (Time, Place, and Person)  Thought Content:  Logical  Suicidal Thoughts:  No  Homicidal Thoughts:  No  Memory:  Immediate;   Fair Recent;   Fair Remote;   Fair  Judgement:  Fair  Insight:  Fair  Psychomotor Activity:  Normal  Concentration:  Concentration: Fair  Recall:  Fair  Fund of Knowledge:  Fair  Language:  Fair  Akathisia:  No  Handed:  Right  AIMS (if indicated):     Assets:  Communication Skills Desire for Improvement Housing Resilience Social Support  ADL's:  Intact  Cognition:  WNL  Sleep:  Number of Hours: 8     Have you used any form of tobacco in the last 30 days? (Cigarettes, Smokeless Tobacco, Cigars, and/or Pipes): Yes  Has this patient used any form of tobacco in the last 30 days? (Cigarettes, Smokeless Tobacco, Cigars, and/or Pipes) Yes, Yes, A prescription for an FDA-approved tobacco cessation medication was offered at discharge and the patient refused  Blood Alcohol level:  Lab Results  Component Value Date   [facility] <10 01/13/2019   ETH <10 04/08/2018    Metabolic Disorder Labs:  Lab Results  Component Value  Date   HGBA1C 5.3 04/08/2018   MPG 105.41 04/08/2018   No results found for: PROLACTIN Lab Results  Component Value Date   CHOL 150 04/08/2018   TRIG 126 04/08/2018   HDL 44 04/08/2018   CHOLHDL 3.4 04/08/2018   VLDL 25 04/08/2018   LDLCALC 81 04/08/2018    See Psychiatric Specialty Exam and Suicide Risk Assessment completed by Attending Physician prior to discharge.  Discharge destination:  Home  Is patient on multiple antipsychotic therapies at discharge:  No   Has Patient had three or more failed trials of antipsychotic monotherapy by history:  No  Recommended Plan for Multiple Antipsychotic Therapies: NA  Discharge Instructions    Diet - low sodium heart healthy   Complete by:  As directed    Increase activity slowly   Complete by:  As directed      Allergies as of 01/15/2019   No Known Allergies     Medication List    STOP taking these medications   multivitamin with minerals tablet     TAKE these medications     Indication  albuterol 108 (90 Base) MCG/ACT inhaler Commonly known as:  PROVENTIL HFA;VENTOLIN HFA Inhale into the lungs every 6 (six) hours as needed for wheezing or shortness of breath.  Indication:  Asthma   ARIPiprazole ER 400 MG Srer injection Commonly known as:  ABILIFY MAINTENA Inject 2 mLs (400 mg total) into the muscle every 28 (twenty-eight) days. Next injection due 05/08/18  Indication:  Schizophrenia   traZODone 100 MG tablet Commonly known as:  DESYREL Take 1 tablet (100 mg total) by mouth at bedtime as needed for sleep. What changed:    medication strength  how much to take  when to take this  reasons to take this  Indication:  Trouble Sleeping      Follow-up Information    [facility] on 01/22/2019.   Why:  Please follow up at [facility] on Wednesday, January 22, 2019 at 930am. Thank you. Contact information: [address] [place] [address] [phone]           Follow-up recommendations:   Activity:  Activity as tolerated Diet:  Regular diet Other:  Patient is counseled to avoid alcohol and cocaine and other intoxicants and continue his medicine management.  Patient was specifically asked whether he was having any thoughts of burning down his sister's house or doing anything else and  violent towards his sister or anyone else and categorically denied all of this.  Comments: Patient appears to be calm and at baseline without overt psychosis or threatening or dangerous behavior.  No longer meets commitment criteria.  Signed: Mordecai Rasmussen, MD 01/15/2019, 12:55 PM

## 2019-01-17 ENCOUNTER — Ambulatory Visit: Payer: Medicaid Other | Admitting: Nurse Practitioner

## 2019-01-27 ENCOUNTER — Telehealth: Payer: Medicaid Other

## 2019-01-29 ENCOUNTER — Ambulatory Visit: Payer: Self-pay | Admitting: Licensed Clinical Social Worker

## 2019-01-29 ENCOUNTER — Telehealth: Payer: Self-pay

## 2019-01-29 NOTE — Chronic Care Management (AMB) (Signed)
  Chronic Care Management    Clinical Social Work CCM Outreach Note  01/29/2019 Name: Dominic [name] MRN: [id number] DOB: 14-Jan-1976  [name] is a 43 y.o. year old male who is a primary care patient of Cannady, Dorie Rank, NP . The CCM team was consulted for assistance with [name]. LCSW reached out to [name] today by phone to introduce and offer CCM services but was unable to reach him successfully. LCSW was unable to leave a voice message as phone number said it was "not accepting calls" at this time. LCSW completed outreach call to second contact number in chart [phone] and was informed that this was not patient's number but an old coworker of the patient and that he is no longer in contact with him. LCSW will make an additional outreach attempt within 7-10 days.   Follow Up Plan: SW will follow up with patient by phone over the next 7-10 days   [name], BSW, MSW, LCSW [facility] Care [facility] [place]  [facility] [email or web address] Phone: [phone]

## 2019-01-30 ENCOUNTER — Emergency Department
Admission: EM | Admit: 2019-01-30 | Discharge: 2019-01-30 | Disposition: A | Discharge status: Home / Self Care | Payer: Medicaid Other | Attending: Emergency Medicine | Admitting: Emergency Medicine

## 2019-01-30 ENCOUNTER — Other Ambulatory Visit: Payer: Self-pay

## 2019-01-30 DIAGNOSIS — F17210 Nicotine dependence, cigarettes, uncomplicated: Secondary | ICD-10-CM | POA: Insufficient documentation

## 2019-01-30 DIAGNOSIS — Z79899 Other long term (current) drug therapy: Secondary | ICD-10-CM | POA: Diagnosis not present

## 2019-01-30 DIAGNOSIS — J029 Acute pharyngitis, unspecified: Secondary | ICD-10-CM

## 2019-01-30 DIAGNOSIS — J45909 Unspecified asthma, uncomplicated: Secondary | ICD-10-CM | POA: Insufficient documentation

## 2019-01-30 DIAGNOSIS — J020 Streptococcal pharyngitis: Secondary | ICD-10-CM | POA: Diagnosis not present

## 2019-01-30 LAB — GROUP A STREP BY PCR: Group A Strep by PCR: DETECTED — AB

## 2019-01-30 MED ORDER — PENICILLIN G BENZATHINE & PROC 1200000 UNIT/2ML IM SUSP
1.2000 10*6.[IU] | Freq: Once | INTRAMUSCULAR | Status: AC
  Administered 2019-01-30: 1.2 10*6.[IU] via INTRAMUSCULAR
  Filled 2019-01-30: qty 2

## 2019-01-30 MED ORDER — DEXAMETHASONE 1 MG/ML PO CONC
10.0000 mg | Freq: Once | ORAL | Status: AC
  Administered 2019-01-30: 10 mg via ORAL
  Filled 2019-01-30: qty 10

## 2019-01-30 NOTE — ED Provider Notes (Signed)
[facility] Emergency Department Provider Note   ____________________________________________   First MD Initiated Contact with Patient 01/30/19 [phone]     (approximate)  I have reviewed the triage vital signs and the nursing notes.   HISTORY  Chief Complaint Sore Throat    HPI Dominic [name] is a 43 y.o. male who was visiting his mother in the hospital and came down to the emergency department with a chief complaint of sore throat x2 days.  Patient denies associated fever, chills, chest pain, shortness of breath, abdominal pain, vomiting or rash.  Denies recent travel or exposure to coronavirus.       Past Medical History:  Diagnosis Date  . Anxiety   . Asthma   . Bipolar 1 disorder (HCC)   . Chronic hepatitis C without hepatic coma (HCC) 05/28/2018  . Depression   . Hepatitis C   . Hepatitis C antibody positive in blood 05/28/2018  . Schizophrenia (HCC)   . Sleep apnea     Patient Active Problem List   Diagnosis Date Noted  . Schizophrenia, chronic condition (HCC) 01/14/2019  . Alcohol intoxication, uncomplicated (HCC) 01/13/2019  . Asthma 11/25/2018  . Nicotine dependence, cigarettes, uncomplicated 11/25/2018  . Chronic hepatitis C without hepatic coma (HCC) 05/28/2018  . Schizophrenia (HCC) 02/16/2016  . Alcohol abuse 02/16/2016  . Cocaine abuse (HCC) 02/16/2016    Past Surgical History:  Procedure Laterality Date  . CYST REMOVAL NECK     neck    Prior to Admission medications   Medication Sig Start Date End Date Taking? Authorizing Provider  albuterol (PROVENTIL HFA;VENTOLIN HFA) 108 (90 Base) MCG/ACT inhaler Inhale into the lungs every 6 (six) hours as needed for wheezing or shortness of breath.    [provider]  ARIPiprazole ER (ABILIFY MAINTENA) 400 MG SRER injection Inject 2 mLs (400 mg total) into the muscle every 28 (twenty-eight) days. Next injection due 05/08/18 05/08/18   McNew, Ileene Hutchinson, MD  traZODone (DESYREL)  100 MG tablet Take 1 tablet (100 mg total) by mouth at bedtime as needed for sleep. 01/14/19   Clapacs, Jackquline Denmark, MD    Allergies Patient has no known allergies.  Family History  Problem Relation Age of Onset  . Hypertension Sister   . Schizophrenia Sister     Social History Social History   Tobacco Use  . Smoking status: Current Some Day Smoker    Packs/day: 1.00    Types: Cigarettes  . Smokeless tobacco: Never Used  Substance Use Topics  . Alcohol use: Yes    Alcohol/week: 3.0 standard drinks    Types: 3 Shots of liquor per week  . Drug use: Yes    Types: Cocaine, Marijuana    Comment: last used cocaine Thursday and MJ use daily    Review of Systems  Constitutional: No fever/chills Eyes: No visual changes. ENT: Positive for sore throat. Cardiovascular: Denies chest pain. Respiratory: Denies shortness of breath. Gastrointestinal: No abdominal pain.  No nausea, no vomiting.  No diarrhea.  No constipation. Genitourinary: Negative for dysuria. Musculoskeletal: Negative for back pain. Skin: Negative for rash. Neurological: Negative for headaches, focal weakness or numbness.   ____________________________________________   PHYSICAL EXAM:  VITAL SIGNS: ED Triage Vitals  Enc Vitals Group     BP 01/30/19 0441 133/72     Pulse Rate 01/30/19 0441 73     Resp 01/30/19 0441 20     Temp 01/30/19 0441 98.6 F (37 C)     Temp  Source 01/30/19 0441 Oral     SpO2 01/30/19 0441 100 %     Weight 01/30/19 0440 225 lb (102.1 kg)     Height 01/30/19 0440 5\' 11"  (1.803 m)     Head Circumference --      Peak Flow --      Pain Score 01/30/19 0440 8     Pain Loc --      Pain Edu? --      Excl. in GC? --     Constitutional: Alert and oriented. Well appearing and in no acute distress. Eyes: Conjunctivae are normal. PERRL. EOMI. Head: Atraumatic. Nose: No congestion/rhinnorhea. Mouth/Throat: Mucous membranes are moist.  Oropharynx moderately erythematous with mild bilateral  tonsillar swelling.  No exudates or peritonsillar abscess.  There is no hoarse or muffled voice.  There is no drooling. Neck: No stridor.  Supple neck without meningismus. Hematological/Lymphatic/Immunilogical: No cervical lymphadenopathy. Cardiovascular: Normal rate, regular rhythm. Grossly normal heart sounds.  Good peripheral circulation. Respiratory: Normal respiratory effort.  No retractions. Lungs CTAB. Gastrointestinal: Soft and nontender. No distention. No abdominal bruits. No CVA tenderness. Musculoskeletal: No lower extremity tenderness nor edema.  No joint effusions. Neurologic:  Normal speech and language. No gross focal neurologic deficits are appreciated. No gait instability. Skin:  Skin is warm, dry and intact. No rash noted. Psychiatric: Mood and affect are normal. Speech and behavior are normal.  ____________________________________________   LABS (all labs ordered are listed, but only abnormal results are displayed)  Labs Reviewed  GROUP A STREP BY PCR - Abnormal; Notable for the following components:      Result Value   Group A Strep by PCR DETECTED (*)    All other components within normal limits   ____________________________________________  EKG  None ____________________________________________  RADIOLOGY  ED MD interpretation: None  Official radiology report(s): No results found.  ____________________________________________   PROCEDURES  Procedure(s) performed (including Critical Care):  Procedures   ____________________________________________   INITIAL IMPRESSION / ASSESSMENT AND PLAN / ED COURSE  As part of my medical decision making, I reviewed the following data within the electronic MEDICAL RECORD NUMBER Nursing notes reviewed and incorporated, Labs reviewed and Notes from prior ED visits        43 year old male who presents with sore throat.  Rapid strep is positive.  Patient opts for IM Bicillin.  Will also administer Decadron.   Strict return precautions given.  Patient verbalizes understanding and agrees with plan of care.      ____________________________________________   FINAL CLINICAL IMPRESSION(S) / ED DIAGNOSES  Final diagnoses:  Sore throat  Strep throat     ED Discharge Orders    None       Note:  This document was prepared using Dragon voice recognition software and may include unintentional dictation errors.   Irean Hong, MD 01/30/19 [phone]

## 2019-01-30 NOTE — ED Notes (Signed)
Family at bedside. Family states that once patient is discharge they will escort patient to mothers room.

## 2019-01-30 NOTE — ED Triage Notes (Signed)
Pt states he has had sore throat for 2 days, pt was visiting mother on inpatient unit. Was brought down by security due to worsening pain.

## 2019-01-30 NOTE — ED Notes (Signed)
Patient states having sore throat since Wednesday. Patient has been with mom on unit in hospital.  Patient states pain is 8/10.  Patient denies SI/HI/AVH at time of assessment.

## 2019-01-30 NOTE — Discharge Instructions (Signed)
You have been treated for strep throat with an antibiotic shot.  You have also had a dose of steroid which will help the inflammation in your throat.  Return to the ER for worsening symptoms, persistent vomiting, difficulty breathing or other concerns.

## 2019-02-06 ENCOUNTER — Ambulatory Visit: Payer: Medicaid Other | Admitting: Licensed Clinical Social Worker

## 2019-02-06 ENCOUNTER — Other Ambulatory Visit: Payer: Self-pay

## 2019-02-06 DIAGNOSIS — B182 Chronic viral hepatitis C: Secondary | ICD-10-CM

## 2019-02-06 DIAGNOSIS — F200 Paranoid schizophrenia: Secondary | ICD-10-CM

## 2019-02-06 DIAGNOSIS — F1410 Cocaine abuse, uncomplicated: Secondary | ICD-10-CM

## 2019-02-06 DIAGNOSIS — F1010 Alcohol abuse, uncomplicated: Secondary | ICD-10-CM

## 2019-02-06 NOTE — Chronic Care Management (AMB) (Signed)
  Chronic Care Management    Clinical Social Work General Note  02/06/2019 Name: Dominic [name] MRN: [id number] DOB: 03-03-1976  Dominic [name] is a 43 y.o. year old male who is a primary care patient of Cannady, Barbaraann Faster, NP. The CCM was consulted to assist the patient with Transportation Resources, Level of Care Concerns and [place] and Resources.   Dominic [name] was given information about Chronic Care Management services today including:  1. CCM service includes personalized support from designated clinical staff supervised by his physician, including individualized plan of care and coordination with other care providers 2. 24/7 contact phone numbers for assistance for urgent and routine care needs. 3. Service will only be billed when office clinical staff spend 20 minutes or more in a month to coordinate care. 4. Only one practitioner may furnish and bill the service in a calendar month. 5. The patient may stop CCM services at any time (effective at the end of the month) by phone call to the office staff. 6. The patient will be responsible for cost sharing (co-pay) of up to 20% of the service fee (after annual deductible is met).  Patient agreed to services and verbal consent obtained. HIPPA verifications received. Patient is agreeable to CCM LCSW involvement.  Review of patient status, including review of consultants reports, relevant laboratory and other test results, and collaboration with appropriate care team members and the patient's provider was performed as part of comprehensive patient evaluation and provision of chronic care management services.    Goals Addressed    . "I want to learn more about my Medicaid resources." (pt-stated)       Current Barriers:  . Financial constraints . Limited social support . Mental Health Concerns  . Cognitive Deficits . Lacks knowledge of community resource: Medicaid resources  Clinical Social Work Clinical Goal(s):  Marland Kitchen  Over the next 90 days days, client will work with SW to address concerns related to gaining stable transportation through [place] since losing his peer support specialist . Over the next 90 days, patient will attend all scheduled medical appointments: including mental health appointments (RHA injection scheduled on 02/10/19)  Interventions: . Patient interviewed and appropriate assessments performed . Provided patient with information about Medicaid benefit information . Discussed plans with patient for ongoing care management follow up and provided patient with direct contact information for care management team . Advised patient to contact Medicaid at [phone] to set up transportation arrangements as [employer] declined transportation arrangements due to active Medicaid status . Assisted patient/caregiver with obtaining information about health plan benefits  Patient Self Care Activities:  . Attends all scheduled provider appointments . Calls provider office for new concerns or questions  Initial goal documentation   Follow Up Plan: SW will follow up with patient by phone over the next 2-4 weeks   Eula Fried, [place], MSW, [email or web address] Phone: [phone]        [facility]

## 2019-02-06 NOTE — Patient Instructions (Signed)
Licensed Clinical Social Worker Visit Information  Goals we discussed today:  Goals Addressed    . "I want to learn more about my Medicaid resources." (pt-stated)       Current Barriers:  . Financial constraints . Limited social support . Mental Health Concerns  . Cognitive Deficits . Lacks knowledge of community resource: Medicaid resources  Clinical Social Work Clinical Goal(s):  Marland Kitchen Over the next 90 days days, client will work with SW to address concerns related to gaining stable transportation through [place] since losing his peer support specialist . Over the next 90 days, patient will attend all scheduled medical appointments: including mental health appointments (RHA injection scheduled on 02/10/19)  Interventions: . Patient interviewed and appropriate assessments performed . Provided patient with information about Medicaid benefit information . Discussed plans with patient for ongoing care management follow up and provided patient with direct contact information for care management team . Advised patient to contact Medicaid at [phone] to set up transportation arrangements as [employer] declined transportation arrangements due to active Medicaid status . Assisted patient/caregiver with obtaining information about health plan benefits  Patient Self Care Activities:  . Attends all scheduled provider appointments . Calls provider office for new concerns or questions  Initial goal documentation   Materials provided: Yes: regarding community support resources and contact information  Dominic [name] was given information about Chronic Care Management services today including:  1. CCM service includes personalized support from designated clinical staff supervised by his physician, including individualized plan of care and coordination with other care providers 2. 24/7 contact phone numbers for assistance for urgent and routine care needs. 3. Service will only be billed when office clinical  staff spend 20 minutes or more in a month to coordinate care. 4. Only one practitioner may furnish and bill the service in a calendar month. 5. The patient may stop CCM services at any time (effective at the end of the month) by phone call to the office staff. 6. The patient will be responsible for cost sharing (co-pay) of up to 20% of the service fee (after annual deductible is met).  Patient agreed to services and verbal consent obtained.   Print copy of patient instructions provided.   Follow up plan: SW will follow up with patient by phone over the next 2-4 weeks   Eula Fried, [place], MSW, [name] Phone: [phone]

## 2019-02-20 ENCOUNTER — Telehealth: Payer: Medicaid Other

## 2019-02-24 ENCOUNTER — Telehealth: Payer: Self-pay

## 2019-02-24 ENCOUNTER — Ambulatory Visit: Payer: Self-pay | Admitting: Licensed Clinical Social Worker

## 2019-02-24 NOTE — Chronic Care Management (AMB) (Signed)
  Chronic Care Management    Clinical Social Work CCM Outreach Note  02/24/2019 Name: Dominic [name] MRN: [id number] DOB: [birth year]  Dominic [name] is a 43 y.o. year old male who is a primary care patient of Cannady, Dorie Rank, NP . The CCM team was consulted for assistance with [name] and Mental Health Counseling and Resources.   LCSW reached out to [name] today to follow up on social work needs but was unable to reach patient successfully. HIPPA compliant voice message left encouraging a return call once available.   Follow Up Plan: SW will follow up with patient by phone over the next 30 days if no return call has been made.   [name], BSW, MSW, LCSW [facility] Care Management [place]  [facility] [email or web address] Phone: [phone]

## 2019-03-24 ENCOUNTER — Telehealth: Payer: Self-pay

## 2019-03-24 ENCOUNTER — Ambulatory Visit: Payer: Self-pay | Admitting: Licensed Clinical Social Worker

## 2019-03-24 NOTE — Chronic Care Management (AMB) (Signed)
  Care Management Note   Dominic [name] is a 43 y.o. year old male who is a primary care patient of Cannady, Dorie Rank, NP . The CM team was consulted for assistance with [name].   Review of patient status, including review of consultants reports, rand collaboration with appropriate care team members and the patient's provider was performed as part of comprehensive patient evaluation and provision of chronic care management services. Telephone outreach to patient today to introduce CCM services.   I reached out to [name] by phone today but was unable to reach him and assess his social work needs. LCSW left a HIPPA compliant voice message encouraging patient to return call once available.   Follow Up Plan: SW will follow up with patient by phone over the next 30 days  [name], BSW, MSW, LCSW [facility] Care Management [place]  [facility] [email or web address] Phone: [phone]

## 2019-04-28 ENCOUNTER — Ambulatory Visit: Payer: Self-pay | Admitting: Licensed Clinical Social Worker

## 2019-04-28 ENCOUNTER — Telehealth: Payer: Self-pay

## 2019-04-28 NOTE — Chronic Care Management (AMB) (Signed)
  Care Management   Follow Up Note   04/28/2019 Name: Dominic [name] MRN: [id number] DOB: 1976/07/27  Referred by: Venita Lick, NP Reason for referral : Care Coordination   Dominic [name] is a 43 y.o. year old male who is a primary care patient of Cannady, Barbaraann Faster, NP. The care management team was consulted for assistance with care management and care coordination needs.    Review of patient status, including review of consultants reports, relevant laboratory and other test results, and collaboration with appropriate care team members and the patient's provider was performed as part of comprehensive patient evaluation and provision of chronic care management services.    LCSW completed CCM outreach attempt today but was unable to reach patient successfully. A HIPPA compliant voice message was left encouraging patient to return call once available. LCSW rescheduled CCM SW appointment as well.  The care management team will reach out to the patient again next month.  Eula Fried, BSW, MSW, [facility] Care Management [email or web address] Phone: [phone]

## 2019-05-19 ENCOUNTER — Other Ambulatory Visit: Payer: Self-pay

## 2019-05-19 ENCOUNTER — Emergency Department
Admission: EM | Admit: 2019-05-19 | Discharge: 2019-05-19 | Disposition: A | Discharge status: Home / Self Care | Payer: Medicaid Other | Attending: Emergency Medicine | Admitting: Emergency Medicine

## 2019-05-19 ENCOUNTER — Encounter: Payer: Self-pay | Admitting: Emergency Medicine

## 2019-05-19 DIAGNOSIS — Y929 Unspecified place or not applicable: Secondary | ICD-10-CM | POA: Insufficient documentation

## 2019-05-19 DIAGNOSIS — W2209XA Striking against other stationary object, initial encounter: Secondary | ICD-10-CM | POA: Diagnosis not present

## 2019-05-19 DIAGNOSIS — J45909 Unspecified asthma, uncomplicated: Secondary | ICD-10-CM | POA: Diagnosis not present

## 2019-05-19 DIAGNOSIS — S61511A Laceration without foreign body of right wrist, initial encounter: Secondary | ICD-10-CM | POA: Insufficient documentation

## 2019-05-19 DIAGNOSIS — Z23 Encounter for immunization: Secondary | ICD-10-CM | POA: Insufficient documentation

## 2019-05-19 DIAGNOSIS — S6991XA Unspecified injury of right wrist, hand and finger(s), initial encounter: Secondary | ICD-10-CM | POA: Diagnosis present

## 2019-05-19 DIAGNOSIS — Y939 Activity, unspecified: Secondary | ICD-10-CM | POA: Insufficient documentation

## 2019-05-19 DIAGNOSIS — Y999 Unspecified external cause status: Secondary | ICD-10-CM | POA: Insufficient documentation

## 2019-05-19 DIAGNOSIS — F17210 Nicotine dependence, cigarettes, uncomplicated: Secondary | ICD-10-CM | POA: Diagnosis not present

## 2019-05-19 MED ORDER — TRAMADOL HCL 50 MG PO TABS: 50.0000 mg | ORAL_TABLET | Freq: Four times a day (QID) | ORAL | 0 refills | Status: DC | PRN

## 2019-05-19 MED ORDER — LIDOCAINE HCL (PF) 1 % IJ SOLN
INTRAMUSCULAR | Status: AC
  Filled 2019-05-19: qty 5

## 2019-05-19 MED ORDER — LIDOCAINE HCL (PF) 1 % IJ SOLN
5.0000 mL | Freq: Once | INTRAMUSCULAR | Status: AC
  Administered 2019-05-19: 09:00:00 5 mL

## 2019-05-19 MED ORDER — TETANUS-DIPHTH-ACELL PERTUSSIS 5-2.5-18.5 LF-MCG/0.5 IM SUSP
0.5000 mL | Freq: Once | INTRAMUSCULAR | Status: AC
  Administered 2019-05-19: 09:00:00 0.5 mL via INTRAMUSCULAR
  Filled 2019-05-19: qty 0.5

## 2019-05-19 NOTE — ED Notes (Signed)
See triage note  Presents vis EMS with laceration to right hand   Dried blood noted around hand and to foot  No active bleeding noted at present
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
## 2019-05-19 NOTE — ED Notes (Addendum)
Bandage removed, small lac noted, bleeding controlled. Clean and dry dressing applied.
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
## 2019-05-21 ENCOUNTER — Encounter: Payer: Self-pay | Admitting: Emergency Medicine

## 2019-05-21 ENCOUNTER — Emergency Department
Admission: EM | Admit: 2019-05-21 | Discharge: 2019-05-22 | Disposition: A | Discharge status: Other Acute Inpatient Hospital | Payer: Medicaid Other | Attending: Emergency Medicine | Admitting: Emergency Medicine

## 2019-05-21 DIAGNOSIS — Z20828 Contact with and (suspected) exposure to other viral communicable diseases: Secondary | ICD-10-CM | POA: Insufficient documentation

## 2019-05-21 DIAGNOSIS — F209 Schizophrenia, unspecified: Secondary | ICD-10-CM | POA: Diagnosis present

## 2019-05-21 DIAGNOSIS — R456 Violent behavior: Secondary | ICD-10-CM | POA: Diagnosis not present

## 2019-05-21 DIAGNOSIS — F10920 Alcohol use, unspecified with intoxication, uncomplicated: Secondary | ICD-10-CM | POA: Diagnosis present

## 2019-05-21 DIAGNOSIS — J45909 Unspecified asthma, uncomplicated: Secondary | ICD-10-CM | POA: Diagnosis not present

## 2019-05-21 DIAGNOSIS — F1010 Alcohol abuse, uncomplicated: Secondary | ICD-10-CM | POA: Diagnosis present

## 2019-05-21 DIAGNOSIS — F17210 Nicotine dependence, cigarettes, uncomplicated: Secondary | ICD-10-CM | POA: Insufficient documentation

## 2019-05-21 DIAGNOSIS — R4689 Other symptoms and signs involving appearance and behavior: Secondary | ICD-10-CM | POA: Insufficient documentation

## 2019-05-21 DIAGNOSIS — F29 Unspecified psychosis not due to a substance or known physiological condition: Secondary | ICD-10-CM | POA: Diagnosis not present

## 2019-05-21 DIAGNOSIS — F1410 Cocaine abuse, uncomplicated: Secondary | ICD-10-CM | POA: Diagnosis present

## 2019-05-21 DIAGNOSIS — B182 Chronic viral hepatitis C: Secondary | ICD-10-CM | POA: Diagnosis present

## 2019-05-21 LAB — CBC
HCT: 40 % (ref 39.0–52.0)
Hemoglobin: 14.4 g/dL (ref 13.0–17.0)
MCH: 29.7 pg (ref 26.0–34.0)
MCHC: 36 g/dL (ref 30.0–36.0)
MCV: 82.5 fL (ref 80.0–100.0)
Platelets: 194 10*3/uL (ref 150–400)
RBC: 4.85 MIL/uL (ref 4.22–5.81)
RDW: 13.8 % (ref 11.5–15.5)
WBC: 9.4 10*3/uL (ref 4.0–10.5)
nRBC: 0 % (ref 0.0–0.2)

## 2019-05-21 LAB — COMPREHENSIVE METABOLIC PANEL
ALT: 45 U/L — ABNORMAL HIGH (ref 0–44)
AST: 37 U/L (ref 15–41)
Albumin: 4.1 g/dL (ref 3.5–5.0)
Alkaline Phosphatase: 37 U/L — ABNORMAL LOW (ref 38–126)
Anion gap: 10 (ref 5–15)
BUN: 12 mg/dL (ref 6–20)
CO2: 23 mmol/L (ref 22–32)
Calcium: 9.3 mg/dL (ref 8.9–10.3)
Chloride: 111 mmol/L (ref 98–111)
Creatinine, Ser: 1.04 mg/dL (ref 0.61–1.24)
GFR calc Af Amer: >60 mL/min (ref >60)
GFR calc non Af Amer: >60 mL/min (ref >60)
Glucose, Bld: 104 mg/dL — ABNORMAL HIGH (ref 70–99)
Potassium: 3.6 mmol/L (ref 3.5–5.1)
Sodium: 144 mmol/L (ref 135–145)
Total Bilirubin: 0.2 mg/dL — ABNORMAL LOW (ref 0.3–1.2)
Total Protein: 7.6 g/dL (ref 6.5–8.1)

## 2019-05-21 LAB — ACETAMINOPHEN LEVEL: Acetaminophen (Tylenol), Serum: <10 ug/mL — ABNORMAL LOW (ref 10–30)

## 2019-05-21 LAB — ETHANOL: Alcohol, Ethyl (B): 67 mg/dL — ABNORMAL HIGH (ref <10)

## 2019-05-21 LAB — SALICYLATE LEVEL: Salicylate Lvl: <7 mg/dL (ref 2.8–30.0)

## 2019-05-21 NOTE — ED Notes (Signed)
Pt. Brought in under IVC for paranoid thoughts.  Pt. Has not been compliant with medications.  Pt. Calm and cooperative at this time.  Pt. Unsure how rt. Hand was injured, pt. States he was here a couple days ago and had stiches applied.

## 2019-05-21 NOTE — BH Assessment (Signed)
Patient is to be admitted to [facility] by Caroline Sauger, NP.  Attending Physician will be Dr. Weber Cooks.   Patient has been assigned to room 316, by [facility] Charge Nurse Matt/Alex.   Intake Paper Work has been signed and placed on patient chart.   ER staff is aware of the admission:  Vaughan Basta, ER Secretary    Dr. Kerman Passey, ER MD   Catalina Antigua, Patient's Nurse   Fernanda Drum, Patient Access.
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
## 2019-05-21 NOTE — ED Triage Notes (Signed)
Pt. Here under IVC 
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
## 2019-05-22 ENCOUNTER — Inpatient Hospital Stay
Admission: AD | Admit: 2019-05-22 | Discharge: 2019-05-23 | DRG: 885 | Disposition: A | Discharge status: Home / Self Care | Payer: Medicaid Other | Source: Intra-hospital | Attending: Psychiatry | Admitting: Psychiatry

## 2019-05-22 ENCOUNTER — Other Ambulatory Visit: Payer: Self-pay

## 2019-05-22 DIAGNOSIS — F203 Undifferentiated schizophrenia: Secondary | ICD-10-CM | POA: Diagnosis present

## 2019-05-22 DIAGNOSIS — Z1159 Encounter for screening for other viral diseases: Secondary | ICD-10-CM | POA: Diagnosis not present

## 2019-05-22 DIAGNOSIS — J45909 Unspecified asthma, uncomplicated: Secondary | ICD-10-CM | POA: Diagnosis present

## 2019-05-22 DIAGNOSIS — F200 Paranoid schizophrenia: Secondary | ICD-10-CM

## 2019-05-22 DIAGNOSIS — F17210 Nicotine dependence, cigarettes, uncomplicated: Secondary | ICD-10-CM | POA: Diagnosis present

## 2019-05-22 DIAGNOSIS — R456 Violent behavior: Secondary | ICD-10-CM | POA: Diagnosis not present

## 2019-05-22 DIAGNOSIS — F1010 Alcohol abuse, uncomplicated: Secondary | ICD-10-CM | POA: Diagnosis present

## 2019-05-22 DIAGNOSIS — Z818 Family history of other mental and behavioral disorders: Secondary | ICD-10-CM

## 2019-05-22 DIAGNOSIS — F29 Unspecified psychosis not due to a substance or known physiological condition: Secondary | ICD-10-CM | POA: Diagnosis not present

## 2019-05-22 DIAGNOSIS — F209 Schizophrenia, unspecified: Principal | ICD-10-CM | POA: Diagnosis present

## 2019-05-22 DIAGNOSIS — F1410 Cocaine abuse, uncomplicated: Secondary | ICD-10-CM | POA: Diagnosis present

## 2019-05-22 DIAGNOSIS — Z79899 Other long term (current) drug therapy: Secondary | ICD-10-CM

## 2019-05-22 LAB — SARS CORONAVIRUS 2 BY RT PCR (HOSPITAL ORDER, PERFORMED IN ~~LOC~~ HOSPITAL LAB): SARS Coronavirus 2: NEGATIVE

## 2019-05-22 MED ORDER — HALOPERIDOL 5 MG PO TABS
10.0000 mg | ORAL_TABLET | Freq: Once | ORAL | Status: AC
  Administered 2019-05-22: 10 mg via ORAL
  Filled 2019-05-22: qty 2

## 2019-05-22 MED ORDER — ARIPIPRAZOLE ER 400 MG IM SRER
400.0000 mg | INTRAMUSCULAR | Status: DC
  Administered 2019-05-22: 400 mg via INTRAMUSCULAR
  Filled 2019-05-22: qty 2

## 2019-05-22 MED ORDER — HYDROXYZINE HCL 25 MG PO TABS
25.0000 mg | ORAL_TABLET | Freq: Four times a day (QID) | ORAL | Status: DC | PRN
  Administered 2019-05-23: 25 mg via ORAL
  Filled 2019-05-22: qty 1

## 2019-05-22 MED ORDER — TRAZODONE HCL 100 MG PO TABS
100.0000 mg | ORAL_TABLET | Freq: Every evening | ORAL | Status: DC | PRN
  Administered 2019-05-22: 21:00:00 100 mg via ORAL
  Filled 2019-05-22: qty 1

## 2019-05-22 MED ORDER — ALBUTEROL SULFATE HFA 108 (90 BASE) MCG/ACT IN AERS
1.0000 | INHALATION_SPRAY | Freq: Four times a day (QID) | RESPIRATORY_TRACT | Status: DC | PRN
  Filled 2019-05-22: qty 6.7

## 2019-05-22 MED ORDER — TRAMADOL HCL 50 MG PO TABS
50.0000 mg | ORAL_TABLET | Freq: Four times a day (QID) | ORAL | Status: DC | PRN
  Administered 2019-05-23: 08:00:00 50 mg via ORAL
  Filled 2019-05-22: qty 1

## 2019-05-22 MED ORDER — HALOPERIDOL LACTATE 5 MG/ML IJ SOLN
10.0000 mg | Freq: Once | INTRAMUSCULAR | Status: DC
  Filled 2019-05-22: qty 2

## 2019-05-22 MED ORDER — MAGNESIUM HYDROXIDE 400 MG/5ML PO SUSP: 30.0000 mL | Freq: Every day | ORAL | Status: DC | PRN

## 2019-05-22 MED ORDER — ACETAMINOPHEN 325 MG PO TABS: 650.0000 mg | ORAL_TABLET | Freq: Four times a day (QID) | ORAL | Status: DC | PRN

## 2019-05-22 MED ORDER — ALUM & MAG HYDROXIDE-SIMETH 200-200-20 MG/5ML PO SUSP: 30.0000 mL | ORAL | Status: DC | PRN

## 2019-05-22 NOTE — ED Notes (Signed)
Hourly rounding reveals patient sleeping in room. No complaints, stable, in no acute distress. Q15 minute rounds and monitoring via Security to continue. 

## 2019-05-22 NOTE — Tx Team (Signed)
Initial Treatment Plan 05/22/2019 3:22 PM [name] QIH:[id number]    PATIENT STRESSORS: Legal issue Marital or family conflict Medication change or noncompliance Substance abuse   PATIENT STRENGTHS: Ability for insight Active sense of humor Average or above average intelligence Capable of independent living Communication skills   PATIENT IDENTIFIED PROBLEMS: Psychosis 05/22/2019  Substance Abuse  05/22/2019  Legal issues  05/22/2019                 DISCHARGE CRITERIA:  Ability to meet basic life and health needs Improved stabilization in mood, thinking, and/or behavior  PRELIMINARY DISCHARGE PLAN: Outpatient therapy Return to previous living arrangement  PATIENT/FAMILY INVOLVEMENT: This treatment plan has been presented to and reviewed with the patient, Dominic [name], and/or family member,  .  The patient and family have been given the opportunity to ask questions and make suggestions.  Leodis Liverpool, RN 05/22/2019, 3:22 PM

## 2019-05-22 NOTE — ED Notes (Signed)
Patient had his girlfriend on the phone and they asked what the plan was for him, writer explained to them that patient would be observed by psychiatric staff a few more days. Patient became upset while on the phone with his girlfriend and said he was not going to any more hospitals and he was not taking anymore medications. Patient began yelling and screaming, MD Joni Fears ordered to give patient medication Haldol IM. When staff and officers went in to give patient the IM medication, patient refused and pleaded with nurse to give him the medication PO, writer talked with MD and gave patient medication PO with no issues. Patient went to sleep
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
## 2019-05-22 NOTE — Plan of Care (Signed)
  Problem: Education: Goal: Knowledge of [place] [facility] Education information/materials will improve Outcome: Progressing  Instructed patient on [facility] Education , unit programing, verbalize understanding

## 2019-05-22 NOTE — H&P (Signed)
Psychiatric Admission Assessment Adult  Patient Identification: Dominic SloughCedric Deon [name] MRN:  [id number] Date of Evaluation:  05/22/2019 Chief Complaint:  Schizophrenia Principal Diagnosis: Schizophrenia (HCC) Diagnosis:  Principal Problem:   Schizophrenia (HCC) Active Problems:   Alcohol abuse   Cocaine abuse (HCC)  History of Present Illness: Patient seen and chart reviewed.  Patient is a gentleman with schizophrenia and substance abuse problems who was sent to the emergency room under IVC filed by his girlfriend.  She reported that he had been paranoid and agitated recently.  Patient has denied this since coming into the emergency room.  He was slightly intoxicated on presentation.  On interview today the patient presents himself as aggrieved at having been brought here.  Denies having done anything dangerous.  When pressed he admits that he has been drinking recently and has been using cocaine.  Also uses marijuana regularly.  Admits that he cut his hand somehow and has no idea how it happened.  He admits that at the time he had some paranoid thoughts that someone was trying to hurt him but says that he now recognizes that is not true and that he must of cut it by accident.  Patient says he is still taking his psychiatric medicine but has not had his Abilify shot in about a month.  He denies any depression denies suicidal or homicidal thought.  Denies hallucinations denies paranoia.  Says that he is sleeping well eating well and other than the cut on his hand has no physical complaints. Associated Signs/Symptoms: Depression Symptoms:  impaired memory, (Hypo) Manic Symptoms:  Distractibility, Anxiety Symptoms:  Nothing Psychotic Symptoms:  Paranoia, PTSD Symptoms: Negative Total Time spent with patient: 1 hour  Past Psychiatric History: Patient has a history of schizophrenia and has had presentations to the hospital previously.  In the last few months he has had presentations very similar to this  1.  He was probably intoxicated at the time and his girlfriend filed papers.  On evaluation here when sober he did not require inpatient treatment.  [facility] and gets an Abilify shot.  Is the patient at risk to self? No.  Has the patient been a risk to self in the past 6 months? No.  Has the patient been a risk to self within the distant past? No.  Is the patient a risk to others? No.  Has the patient been a risk to others in the past 6 months? No.  Has the patient been a risk to others within the distant past? No.   Prior Inpatient Therapy:   Prior Outpatient Therapy:    Alcohol Screening: 1. How often do you have a drink containing alcohol?: 2 to 3 times a week 2. How many drinks containing alcohol do you have on a typical day when you are drinking?: 5 or 6 3. How often do you have six or more drinks on one occasion?: Weekly AUDIT-C Score: 8 4. How often during the last year have you found that you were not able to stop drinking once you had started?: Never 5. How often during the last year have you failed to do what was normally expected from you becasue of drinking?: Never 6. How often during the last year have you needed a first drink in the morning to get yourself going after a heavy drinking session?: Never 7. How often during the last year have you had a feeling of guilt of remorse after drinking?: Never 8. How often during the  last year have you been unable to remember what happened the night before because you had been drinking?: Never 9. Have you or someone else been injured as a result of your drinking?: No 10. Has a relative or friend or a doctor or another health worker been concerned about your drinking or suggested you cut down?: No Alcohol Use Disorder Identification Test Final Score (AUDIT): 8 Alcohol Brief Interventions/Follow-up: Alcohol Education, Continued Monitoring, Brief Advice, Medication Offered/Prescribed Substance Abuse History in the last 12  months:  Yes.   Consequences of Substance Abuse: Medical Consequences:  Probably got his cut on his hand from being intoxicated Previous Psychotropic Medications: Yes  Psychological Evaluations: Yes  Past Medical History:  Past Medical History:  Diagnosis Date  . Anxiety   . Asthma   . Bipolar 1 disorder (HCC)   . Chronic hepatitis C without hepatic coma (HCC) 05/28/2018  . Depression   . Hepatitis C   . Hepatitis C antibody positive in blood 05/28/2018  . Schizophrenia (HCC)   . Sleep apnea     Past Surgical History:  Procedure Laterality Date  . CYST REMOVAL NECK     neck   Family History:  Family History  Problem Relation Age of Onset  . Hypertension Sister   . Schizophrenia Sister    Family Psychiatric  History: Positive for schizophrenia Tobacco Screening: Have you used any form of tobacco in the last 30 days? (Cigarettes, Smokeless Tobacco, Cigars, and/or Pipes): Yes Tobacco use, Select all that apply: 5 or more cigarettes per day Are you interested in Tobacco Cessation Medications?: Yes, will notify MD for an order Counseled patient on smoking cessation including recognizing danger situations, developing coping skills and basic information about quitting provided: Refused/Declined practical counseling Social History:  Social History   Substance and Sexual Activity  Alcohol Use Yes  . Alcohol/week: 3.0 standard drinks  . Types: 3 Shots of liquor per week     Social History   Substance and Sexual Activity  Drug Use Yes  . Types: Cocaine, Marijuana   Comment: last used cocaine Thursday and MJ use daily    Additional Social History:                           Allergies:  Not on File Lab Results:  Results for orders placed or performed during the hospital encounter of 05/21/19 (from the past 48 hour(s))  Comprehensive metabolic panel     Status: Abnormal   Collection Time: 05/21/19  7:49 PM  Result Value Ref Range   Sodium 144 135 - 145 mmol/L    Potassium 3.6 3.5 - 5.1 mmol/L   Chloride 111 98 - 111 mmol/L   CO2 23 22 - 32 mmol/L   Glucose, Bld 104 (H) 70 - 99 mg/dL   BUN 12 6 - 20 mg/dL   Creatinine, Ser 4.091.04 0.61 - 1.24 mg/dL   Calcium 9.3 8.9 - 81.110.3 mg/dL   Total Protein 7.6 6.5 - 8.1 g/dL   Albumin 4.1 3.5 - 5.0 g/dL   AST 37 15 - 41 U/L   ALT 45 (H) 0 - 44 U/L   Alkaline Phosphatase 37 (L) 38 - 126 U/L   Total Bilirubin 0.2 (L) 0.3 - 1.2 mg/dL   GFR calc non Af Amer >60 >60 mL/min   GFR calc Af Amer >60 >60 mL/min   Anion gap 10 5 - 15    Comment: Performed at [facility],  [address] [place], [place] [address]  Ethanol     Status: Abnormal   Collection Time: 05/21/19  7:49 PM  Result Value Ref Range   Alcohol, Ethyl (B) 67 (H) <10 mg/dL    Comment: (NOTE) Lowest detectable limit for serum alcohol is 10 mg/dL. For medical purposes only. Performed at [facility], [address] [place], [place] [address]   Salicylate level     Status: None   Collection Time: 05/21/19  7:49 PM  Result Value Ref Range   Salicylate Lvl <7.0 2.8 - 30.0 mg/dL    Comment: Performed at [facility], [address] [place], [place] [address]  Acetaminophen level     Status: Abnormal   Collection Time: 05/21/19  7:49 PM  Result Value Ref Range   Acetaminophen (Tylenol), Serum <10 (L) 10 - 30 ug/mL    Comment: (NOTE) Therapeutic concentrations vary significantly. A range of 10-30 ug/mL  may be an effective concentration for many patients. However, some  are best treated at concentrations outside of this range. Acetaminophen concentrations >150 ug/mL at 4 hours after ingestion  and >50 ug/mL at 12 hours after ingestion are often associated with  toxic reactions. Performed at [facility], [address] [place], [place] [address]   cbc     Status: None   Collection Time: 05/21/19  7:49 PM  Result Value Ref Range   WBC 9.4 4.0 - 10.5 K/uL   RBC 4.85 4.22 - 5.81 MIL/uL   Hemoglobin  14.4 13.0 - 17.0 g/dL   HCT 28.4 13.2 - 44.0 %   MCV 82.5 80.0 - 100.0 fL   MCH 29.7 26.0 - 34.0 pg   MCHC 36.0 30.0 - 36.0 g/dL   RDW 10.2 72.5 - 36.6 %   Platelets 194 150 - 400 K/uL   nRBC 0.0 0.0 - 0.2 %    Comment: Performed at [facility], [address], [place], [place] [address]  SARS Coronavirus 2 (CEPHEID - Performed in [facility] hospital lab), Hosp Order     Status: None   Collection Time: 05/21/19 11:33 PM   Specimen: Nasopharyngeal Swab  Result Value Ref Range   SARS Coronavirus 2 NEGATIVE NEGATIVE    Comment: (NOTE) If result is NEGATIVE SARS-CoV-2 target nucleic acids are NOT DETECTED. The SARS-CoV-2 RNA is generally detectable in upper and lower  respiratory specimens during the acute phase of infection. The lowest  concentration of SARS-CoV-2 viral copies this assay can detect is 250  copies / mL. A negative result does not preclude SARS-CoV-2 infection  and should not be used as the sole basis for treatment or other  patient management decisions.  A negative result may occur with  improper specimen collection / handling, submission of specimen other  than nasopharyngeal swab, presence of viral mutation(s) within the  areas targeted by this assay, and inadequate number of viral copies  (<250 copies / mL). A negative result must be combined with clinical  observations, patient history, and epidemiological information. If result is POSITIVE SARS-CoV-2 target nucleic acids are DETECTED. The SARS-CoV-2 RNA is generally detectable in upper and lower  respiratory specimens dur ing the acute phase of infection.  Positive  results are indicative of active infection with SARS-CoV-2.  Clinical  correlation with patient history and other diagnostic information is  necessary to determine patient infection status.  Positive results do  not rule out bacterial infection or co-infection with other viruses. If result is PRESUMPTIVE  POSTIVE SARS-CoV-2 nucleic acids  MAY BE PRESENT.   A presumptive positive result was obtained on the submitted specimen  and confirmed on repeat testing.  While 2019 novel coronavirus  (SARS-CoV-2) nucleic acids may be present in the submitted sample  additional confirmatory testing may be necessary for epidemiological  and / or clinical management purposes  to differentiate between  SARS-CoV-2 and other Sarbecovirus currently known to infect humans.  If clinically indicated additional testing with an alternate test  methodology [phone]) is advised. The SARS-CoV-2 RNA is generally  detectable in upper and lower respiratory sp ecimens during the acute  phase of infection. The expected result is Negative. Fact Sheet for Patients:  BoilerBrush.com.cy Fact Sheet for Healthcare Providers: https://pope.com/ This test is not yet approved or cleared by the [place] FDA and has been authorized for detection and/or diagnosis of SARS-CoV-2 by FDA under an Emergency Use Authorization (EUA).  This EUA will remain in effect (meaning this test can be used) for the duration of the COVID-19 declaration under Section 564(b)(1) of the Act, 21 U.S.C. section 360bbb-3(b)(1), unless the authorization is terminated or revoked sooner. Performed at [facility], [address] [place], [place] [address]     Blood Alcohol level:  Lab Results  Component Value Date   ETH 67 (H) 05/21/2019   ETH <10 01/13/2019    Metabolic Disorder Labs:  Lab Results  Component Value Date   HGBA1C 5.3 04/08/2018   MPG 105.41 04/08/2018   No results found for: PROLACTIN Lab Results  Component Value Date   CHOL 150 04/08/2018   TRIG 126 04/08/2018   HDL 44 04/08/2018   CHOLHDL 3.4 04/08/2018   VLDL 25 04/08/2018   LDLCALC 81 04/08/2018    Current Medications: Current Facility-Administered Medications  Medication Dose Route Frequency Provider Last Rate Last Dose  . acetaminophen  (TYLENOL) tablet 650 mg  650 mg Oral Q6H PRN Thomspon, Adela Lank, NP      . albuterol (VENTOLIN HFA) 108 (90 Base) MCG/ACT inhaler 1-2 puff  1-2 puff Inhalation Q6H PRN Catalina Gravel, NP      . alum & mag hydroxide-simeth (MAALOX/MYLANTA) 200-200-20 MG/5ML suspension 30 mL  30 mL Oral Q4H PRN Catalina Gravel, NP      . ARIPiprazole ER (ABILIFY MAINTENA) injection 400 mg  400 mg Intramuscular Q28 days Catalina Gravel, NP   400 mg at 05/22/19 1655  . hydrOXYzine (ATARAX/VISTARIL) tablet 25 mg  25 mg Oral Q6H PRN Catalina Gravel, NP      . magnesium hydroxide (MILK OF MAGNESIA) suspension 30 mL  30 mL Oral Daily PRN Catalina Gravel, NP      . traMADol (ULTRAM) tablet 50 mg  50 mg Oral Q6H PRN Catalina Gravel, NP      . traZODone (DESYREL) tablet 100 mg  100 mg Oral QHS PRN Catalina Gravel, NP       PTA Medications: Medications Prior to Admission  Medication Sig Dispense Refill Last Dose  . albuterol (PROVENTIL HFA;VENTOLIN HFA) 108 (90 Base) MCG/ACT inhaler Inhale into the lungs every 6 (six) hours as needed for wheezing or shortness of breath.   prn at prn  . ARIPiprazole ER (ABILIFY MAINTENA) 400 MG SRER injection Inject 2 mLs (400 mg total) into the muscle every 28 (twenty-eight) days. Next injection due 05/08/18 1 each 0 Past Month at Unknown time  . traMADol (ULTRAM) 50 MG tablet Take 1 tablet (50 mg total) by mouth every 6 (six) hours as needed for up to  3 days. 12 tablet 0 05/21/2019 at unknown  . traZODone (DESYREL) 100 MG tablet Take 1 tablet (100 mg total) by mouth at bedtime as needed for sleep. 30 tablet 0 Past Week at Unknown time    Musculoskeletal: Strength & Muscle Tone: within normal limits Gait & Station: normal Patient leans: N/A  Psychiatric Specialty Exam: Physical Exam  Nursing note and vitals reviewed. Constitutional: He appears well-developed and well-nourished.  HENT:  Head: Normocephalic and atraumatic.  Eyes: Pupils are equal,  round, and reactive to light. Conjunctivae are normal.  Neck: Normal range of motion.  Cardiovascular: Regular rhythm and normal heart sounds.  Respiratory: Effort normal.  GI: Soft.  Musculoskeletal: Normal range of motion.  Neurological: He is alert.  Skin: Skin is warm and dry.  Psychiatric: His affect is blunt. His speech is delayed. He is slowed. Thought content is not paranoid. He expresses impulsivity. He expresses no homicidal and no suicidal ideation. He exhibits abnormal recent memory.    Review of Systems  Constitutional: Negative.   HENT: Negative.   Eyes: Negative.   Respiratory: Negative.   Cardiovascular: Negative.   Gastrointestinal: Negative.   Musculoskeletal: Negative.   Skin: Negative.   Neurological: Negative.   Psychiatric/Behavioral: Positive for substance abuse. Negative for depression and suicidal ideas.    Blood pressure 125/73, pulse (!) 56, temperature 97.7 F (36.5 C), temperature source Oral, resp. rate 18, height 6' (1.829 m), weight 97.5 kg, SpO2 99 %.Body mass index is 29.16 kg/m.  General Appearance: Casual  Eye Contact:  Good  Speech:  Clear and Coherent  Volume:  Normal  Mood:  Euthymic  Affect:  Constricted  Thought Process:  Coherent  Orientation:  Full (Time, Place, and Person)  Thought Content:  Logical  Suicidal Thoughts:  No  Homicidal Thoughts:  No  Memory:  Immediate;   Fair Recent;   Fair Remote;   Fair  Judgement:  Fair  Insight:  Fair  Psychomotor Activity:  Normal  Concentration:  Concentration: Fair  Recall:  [employer] of Knowledge:  Fair  Language:  Fair  Akathisia:  No  Handed:  Right  AIMS (if indicated):     Assets:  Desire for Improvement  ADL's:  Intact  Cognition:  WNL  Sleep:       Treatment Plan Summary: Plan Patient admitted to the psychiatric unit.  15-minute checks in place.  Labs reviewed.  Patient appears to have been agitated and a little paranoid while intoxicated but now is back to his baseline.   No sign that he is showing any dangerousness at this point.  Patient already got his Abilify shot on his way up here today.  No change to medicine.  Likely to discharge tomorrow  Observation Level/Precautions:  15 minute checks  Laboratory:  UDS  Psychotherapy:    Medications:    Consultations:    Discharge Concerns:    Estimated LOS:  Other:     Physician Treatment Plan for Primary Diagnosis: Schizophrenia ([place]) Long Term Goal(s): Improvement in symptoms so as ready for discharge  Short Term Goals: Ability to verbalize feelings will improve and Ability to demonstrate self-control will improve  Physician Treatment Plan for Secondary Diagnosis: Principal Problem:   Schizophrenia ([place]) Active Problems:   Alcohol abuse   Cocaine abuse ([place])  Long Term Goal(s): Improvement in symptoms so as ready for discharge  Short Term Goals: Ability to identify triggers associated with substance abuse/mental health issues will improve  I certify that inpatient services  furnished can reasonably be expected to improve the patient's condition.    Mordecai RasmussenJohn Clapacs, MD 7/9/20205:42 PM

## 2019-05-22 NOTE — BH Assessment (Signed)
Spoke with patient for updated/reassessment. Patient voice frustration about been under IVC. He reports his girlfriend does this when he do not take his medications. He states, he don't see how she is able to do this because their is nothing wrong with him.

## 2019-05-22 NOTE — Progress Notes (Signed)
Admission Note:  D: Pt appeared depressed  With  a flat affect.  Pt  denies SI / AVH at this time. 43 year old black male in under the services of Dr Weber Cooks . Patient admitted for psychosis  And committed by girlfriend and sister . Patient has a laceration on right hand  With several stiches . No to have old laceration scares on abdomen.  Patient has 2 court dates  August 06/26/19  And September  off 2020. Patient noncompliant  With medication , Staff unsure  If patient has followed up with  [facility] . Patient  Stated his drug of choice is Cocaine and Mariajuana. Patient stated he drinks a 2  40 ounce   Drinks  Every other day .   Pt is redirectable and cooperative with assessment.      Past Medical History :  Anxiety, Asthma , Bipolar 1 Hepatitis C,  Schizophrenia, Sleep Apnea   A: Pt admitted to unit per protocol, skin assessment and search done and no contraband found with Ann Lions .  Pt  educated on therapeutic milieu rules. Pt was introduced to milieu by nursing staff.    R: Pt was receptive to education about the milieu .  15 min safety checks started. Probation officer offered support

## 2019-05-23 MED ORDER — TRAZODONE HCL 100 MG PO TABS: 100.0000 mg | ORAL_TABLET | Freq: Every evening | ORAL | 0 refills | Status: DC | PRN

## 2019-05-23 MED ORDER — ARIPIPRAZOLE ER 400 MG IM SRER: 400.0000 mg | INTRAMUSCULAR | 1 refills | Status: DC

## 2019-05-23 MED ORDER — ALBUTEROL SULFATE HFA 108 (90 BASE) MCG/ACT IN AERS: 1.0000 | INHALATION_SPRAY | Freq: Four times a day (QID) | RESPIRATORY_TRACT | 1 refills | Status: DC | PRN

## 2019-05-23 NOTE — Progress Notes (Signed)
D: Patient has been isolative to self and room. Came out for snack. Denies SI, HI and AVH. Mood is pleasant. Affect is appropriate to circumstance. Contracts for safety. A: Continue to monitor for safety. R: Safety maintained.

## 2019-05-23 NOTE — Progress Notes (Signed)
  [facility] Adult Case Management Discharge Plan :  Will you be returning to the same living situation after discharge:  Yes,  pt is returning home. At discharge, do you have transportation home?: Yes,  CSW will assist with cab voucher. Do you have the ability to pay for your medications: Yes,  Medicaid  Release of information consent forms completed and in the chart;  Patient's signature needed at discharge.  Patient to Follow up at: Follow-up Information    [facility] on 05/30/2019.   Why: Please attend your appointment scheduled for 05/30/2019 at 12:30pm. Contact information: [address] [place] [address] [phone]           Next level of care provider has access to [place] and Suicide Prevention discussed: No.  Patient being discharged in less than 24 hours, SPE unable to be completed.   Have you used any form of tobacco in the last 30 days? (Cigarettes, Smokeless Tobacco, Cigars, and/or Pipes): Yes  Has patient been referred to the Quitline?: Patient refused referral  Patient has been referred for addiction treatment: Pt. refused referral  Rozann Lesches, LCSW 05/23/2019, 9:45 AM

## 2019-05-23 NOTE — Discharge Summary (Signed)
Physician Discharge Summary Note  Patient:  Dominic [name] is an 43 y.o., male MRN:  [id number] DOB:  27-Oct-1976 Patient phone:  [phone] (home)  Patient address:   [place] [address],  Total Time spent with patient: 45 minutes  Date of Admission:  05/22/2019 Date of Discharge: May 23, 2019  Reason for Admission: Patient admitted through the emergency room where he presented under IVC filed by his partner who was concerned that he was acting paranoid and agitated  Principal Problem: Schizophrenia [facility]) Discharge Diagnoses: Principal Problem:   Schizophrenia ([place]) Active Problems:   Alcohol abuse   Cocaine abuse ([place])   Past Psychiatric History: Patient has a history of schizophrenia and intermittent problems with abuse of alcohol and cocaine and marijuana.  Has had episodes in the past of presenting under similar circumstances where it seemed that intoxication was the primary concern.  At baseline he is compliant with his outpatient treatment and is a established client with [facility]  Past Medical History:  Past Medical History:  Diagnosis Date  . Anxiety   . Asthma   . Bipolar 1 disorder ([place])   . Chronic hepatitis C without hepatic coma ([place]) 05/28/2018  . Depression   . Hepatitis C   . Hepatitis C antibody positive in blood 05/28/2018  . Schizophrenia ([place])   . Sleep apnea     Past Surgical History:  Procedure Laterality Date  . CYST REMOVAL NECK     neck   Family History:  Family History  Problem Relation Age of Onset  . Hypertension Sister   . Schizophrenia Sister    Family Psychiatric  History: None reported Social History:  Social History   Substance and Sexual Activity  Alcohol Use Yes  . Alcohol/week: 3.0 standard drinks  . Types: 3 Shots of liquor per week     Social History   Substance and Sexual Activity  Drug Use Yes  . Types: Cocaine, Marijuana   Comment: last used cocaine Thursday and MJ use daily    Social History    Socioeconomic History  . Marital status: Single    Spouse name: Not on file  . Number of children: Not on file  . Years of education: Not on file  . Highest education level: Not on file  Occupational History  . Not on file  Social Needs  . Financial resource strain: Not very hard  . Food insecurity    Worry: Never true    Inability: Never true  . Transportation needs    Medical: No    Non-medical: No  Tobacco Use  . Smoking status: Current Some Day Smoker    Packs/day: 1.00    Types: Cigarettes  . Smokeless tobacco: Never Used  Substance and Sexual Activity  . Alcohol use: Yes    Alcohol/week: 3.0 standard drinks    Types: 3 Shots of liquor per week  . Drug use: Yes    Types: Cocaine, Marijuana    Comment: last used cocaine Thursday and MJ use daily  . Sexual activity: Not Currently  Lifestyle  . Physical activity    Days per week: 0 days    Minutes per session: 0 min  . Stress: Only a little  Relationships  . Social Herbalist on phone: Twice a week    Gets together: Twice a week    Attends religious service: Never    Active member of club or organization: No    Attends meetings of  clubs or organizations: Never    Relationship status: Patient refused  Other Topics Concern  . Not on file  Social History Narrative  . Not on file    Hospital Course: Patient admitted to the hospital.  Upon first evaluation he appeared to be back at his baseline.  Polite cooperative and lucid.  No agitation.  Completely denied suicidal or homicidal ideation.  Showed insight into the fact that the paranoia he had earlier was misguided.  Denied having any feeling that anyone was out to get him.  Patient was able to acknowledge that he should increase his efforts to stay off of drugs and alcohol.  He received an Abilify long-acting injection 400 mg in the emergency room so that is taken care of for the next month.  Patient spoke with the liaison representative from [facility] and  confirmed his agreement to follow-up with his community support team.  Physical Findings: AIMS:  , ,  ,  ,    CIWA:    COWS:     Musculoskeletal: Strength & Muscle Tone: within normal limits Gait & Station: normal Patient leans: N/A  Psychiatric Specialty Exam: Physical Exam  Nursing note and vitals reviewed. Constitutional: He appears well-developed and well-nourished.  HENT:  Head: Normocephalic and atraumatic.  Eyes: Pupils are equal, round, and reactive to light. Conjunctivae are normal.  Neck: Normal range of motion.  Cardiovascular: Regular rhythm and normal heart sounds.  Respiratory: Effort normal.  GI: Soft.  Musculoskeletal: Normal range of motion.  Neurological: He is alert.  Skin: Skin is warm and dry.  Psychiatric: He has a normal mood and affect. His behavior is normal. Judgment and thought content normal.    Review of Systems  Constitutional: Negative.   HENT: Negative.   Eyes: Negative.   Respiratory: Negative.   Cardiovascular: Negative.   Gastrointestinal: Negative.   Musculoskeletal: Negative.   Skin: Negative.   Neurological: Negative.   Psychiatric/Behavioral: Negative.     Blood pressure 123/82, pulse 66, temperature 97.8 F (36.6 C), temperature source Oral, resp. rate 16, height 6' (1.829 m), weight 97.5 kg, SpO2 97 %.Body mass index is 29.16 kg/m.  General Appearance: Casual  Eye Contact:  Good  Speech:  Clear and Coherent  Volume:  Normal  Mood:  Euthymic  Affect:  Congruent  Thought Process:  Goal Directed  Orientation:  Full (Time, Place, and Person)  Thought Content:  Logical  Suicidal Thoughts:  No  Homicidal Thoughts:  No  Memory:  Immediate;   Fair Recent;   Fair Remote;   Fair  Judgement:  Fair  Insight:  Fair  Psychomotor Activity:  Decreased  Concentration:  Concentration: Fair  Recall:  [employer]  Fund of Knowledge:  Fair  Language:  Fair  Akathisia:  No  Handed:  Right  AIMS (if indicated):     Assets:  Desire for  Improvement Financial Resources/Insurance Housing Physical Health Resilience Social Support  ADL's:  Intact  Cognition:  WNL  Sleep:        Have you used any form of tobacco in the last 30 days? (Cigarettes, Smokeless Tobacco, Cigars, and/or Pipes): Yes  Has this patient used any form of tobacco in the last 30 days? (Cigarettes, Smokeless Tobacco, Cigars, and/or Pipes) Yes, Yes, A prescription for an FDA-approved tobacco cessation medication was offered at discharge and the patient refused  Blood Alcohol level:  Lab Results  Component Value Date   ETH 67 (H) 05/21/2019   ETH <10 01/13/2019  Metabolic Disorder Labs:  Lab Results  Component Value Date   HGBA1C 5.3 04/08/2018   MPG 105.41 04/08/2018   No results found for: PROLACTIN Lab Results  Component Value Date   CHOL 150 04/08/2018   TRIG 126 04/08/2018   HDL 44 04/08/2018   CHOLHDL 3.4 04/08/2018   VLDL 25 04/08/2018   LDLCALC 81 04/08/2018    See Psychiatric Specialty Exam and Suicide Risk Assessment completed by Attending Physician prior to discharge.  Discharge destination:  Home  Is patient on multiple antipsychotic therapies at discharge:  No   Has Patient had three or more failed trials of antipsychotic monotherapy by history:  No  Recommended Plan for Multiple Antipsychotic Therapies: NA  Discharge Instructions    Diet - low sodium heart healthy   Complete by: As directed    Increase activity slowly   Complete by: As directed      Allergies as of 05/23/2019   Not on File     Medication List    STOP taking these medications   traMADol 50 MG tablet Commonly known as: Ultram     TAKE these medications     Indication  albuterol 108 (90 Base) MCG/ACT inhaler Commonly known as: VENTOLIN HFA Inhale 1-2 puffs into the lungs every 6 (six) hours as needed for wheezing or shortness of breath. What changed: how much to take  Indication: Asthma   ARIPiprazole ER 400 MG Srer injection Commonly  known as: ABILIFY MAINTENA Inject 2 mLs (400 mg total) into the muscle every 28 (twenty-eight) days. Start taking on: June 19, 2019 What changed: additional instructions  Indication: Schizophrenia   traZODone 100 MG tablet Commonly known as: DESYREL Take 1 tablet (100 mg total) by mouth at bedtime as needed for sleep.  Indication: Trouble Sleeping        Follow-up recommendations:  Activity:  Activity as tolerated.  May return to work without complication Diet:  Regular diet Other:  Follow-up outpatient with [facility] community support team.  Comments: Patient showed no problematic behavior in the hospital.  Asymptomatic at this time.  Has a safe place to live and appropriate plans for the future.  Does not meet commitment criteria  Signed: Mordecai RasmussenJohn Clapacs, MD 05/23/2019, 9:07 AM

## 2019-05-23 NOTE — Progress Notes (Signed)
Recreation Therapy Notes   Date: 05/23/2019  Time: 9:30 am   Location: Craft room   Behavioral response: N/A   Intervention Topic: Relaxation  Discussion/Intervention: Patient did not attend group.   Clinical Observations/Feedback:  Patient did not attend group.   Zair Borawski LRT/CTRS        Kaleem Sartwell 05/23/2019 10:52 AM

## 2019-05-23 NOTE — Plan of Care (Signed)
  Problem: Education: Goal: Knowledge of disease or condition will improve Outcome: Progressing Goal: Understanding of discharge needs will improve Outcome: Progressing  D: Patient has been isolative to self and room. Came out for snack. Denies SI, HI and AVH. Mood is pleasant. Affect is appropriate to circumstance. Contracts for safety. A: Continue to monitor for safety. R: Safety maintained.

## 2019-05-23 NOTE — [facility]HH Suicide Risk Assessment (Signed)
[facility] Discharge Suicide Risk Assessment   Principal Problem: Schizophrenia [facility]) Discharge Diagnoses: Principal Problem:   Schizophrenia ([place]) Active Problems:   Alcohol abuse   Cocaine abuse ([place])   Total Time spent with patient: 45 minutes  Musculoskeletal: Strength & Muscle Tone: within normal limits Gait & Station: normal Patient leans: N/A  Psychiatric Specialty Exam: Review of Systems  Constitutional: Negative.   HENT: Negative.   Eyes: Negative.   Respiratory: Negative.   Cardiovascular: Negative.   Gastrointestinal: Negative.   Musculoskeletal: Negative.   Skin: Negative.   Neurological: Negative.   Psychiatric/Behavioral: Negative.     Blood pressure 123/82, pulse 66, temperature 97.8 F (36.6 C), temperature source Oral, resp. rate 16, height 6' (1.829 m), weight 97.5 kg, SpO2 97 %.Body mass index is 29.16 kg/m.  General Appearance: Casual  Eye Contact::  Good  Speech:  Normal Rate409  Volume:  Normal  Mood:  Euthymic  Affect:  Congruent  Thought Process:  Coherent  Orientation:  Full (Time, Place, and Person)  Thought Content:  Logical  Suicidal Thoughts:  No  Homicidal Thoughts:  No  Memory:  Immediate;   Fair Recent;   Fair Remote;   Fair  Judgement:  Fair  Insight:  Fair  Psychomotor Activity:  Normal  Concentration:  Fair  Recall:  [employer] [place]  Language: Fair  Akathisia:  No  Handed:  Right  AIMS (if indicated):     Assets:  Desire for Improvement Financial Resources/Insurance Housing Physical Health Resilience Social Support  Sleep:     Cognition: WNL  ADL's:  Intact   Mental Status Per Nursing Assessment::   On Admission:  NA  Demographic Factors:  Male  Loss Factors: NA  Historical Factors: Impulsivity  Risk Reduction Factors:   Sense of responsibility to family, Religious beliefs about death, Employed, Living with another person, especially a relative, Positive social support and Positive therapeutic  relationship  Continued Clinical Symptoms:  Schizophrenia:   Paranoid or undifferentiated type  Cognitive Features That Contribute To Risk:  None    Suicide Risk:  Minimal: No identifiable suicidal ideation.  Patients presenting with no risk factors but with morbid ruminations; may be classified as minimal risk based on the severity of the depressive symptoms    Plan Of Care/Follow-up recommendations:  Activity:  Activity as tolerated Diet:  Regular diet Other:  Continue follow-up with [facility] as usual.  Patient at this point lucid no sign of psychosis mood quite stable and appropriate.  Does not appear to be at significant risk of dangerousness.  Alethia Berthold, MD 05/23/2019, 9:02 AM

## 2019-05-23 NOTE — Progress Notes (Signed)
Patient denies SI/HI, denies A/V hallucinations. Patient verbalizes understanding of discharge instructions, follow up care and prescriptions. Patient given all belongings from [facility] locker. Patient escorted out by staff, transported by family. 

## 2019-05-23 NOTE — [facility] Group Notes (Signed)
Feelings Around Relapse 05/23/2019 1PM  Type of Therapy and Topic:  Group Therapy:  Feelings around Relapse and Recovery  Participation Level:  Did Not Attend   Description of Group:    Patients in this group will discuss emotions they experience before and after a relapse. They will process how experiencing these feelings, or avoidance of experiencing them, relates to having a relapse. Facilitator will guide patients to explore emotions they have related to recovery. Patients will be encouraged to process which emotions are more powerful. They will be guided to discuss the emotional reaction significant others in their lives may have to patients' relapse or recovery. Patients will be assisted in exploring ways to respond to the emotions of others without this contributing to a relapse.  Therapeutic Goals: 1. Patient will identify two or more emotions that lead to a relapse for them 2. Patient will identify two emotions that result when they relapse 3. Patient will identify two emotions related to recovery 4. Patient will demonstrate ability to communicate their needs through discussion and/or role plays   Summary of Patient Progress:     Therapeutic Modalities:   Cognitive Behavioral Therapy Solution-Focused Therapy Assertiveness Training Relapse Prevention Therapy   Shreya Lacasse T Kaylei Frink, LCSW 05/23/2019 1:51 PM   

## 2019-05-23 NOTE — BHH Counselor (Signed)
Patient is being discharged in less than 24 hours.  PSA and SPE was unable to be completed.   Assunta Curtis, MSW, LCSW 05/23/2019 9:26 AM

## 2019-05-29 ENCOUNTER — Telehealth: Payer: Self-pay | Admitting: Nurse Practitioner

## 2019-05-29 NOTE — Telephone Encounter (Signed)
Patient is calling to schedule hospital follow up and returning the call to call back based on Jada's letter Patient is also wanting to know his Lab results   Please advise [phone]

## 2019-05-30 NOTE — Telephone Encounter (Signed)
Called pt to schedule f/u no answer, left vm 

## 2019-05-30 NOTE — Telephone Encounter (Signed)
Thank you.  Definitely continue to try.  He can be difficult to get in touch with, CCM team has had issues, and he has missed at least three of his past scheduled visits.

## 2019-06-09 ENCOUNTER — Telehealth: Payer: Self-pay

## 2019-06-09 ENCOUNTER — Ambulatory Visit: Payer: Self-pay | Admitting: Licensed Clinical Social Worker

## 2019-06-09 NOTE — Chronic Care Management (AMB) (Signed)
  Care Management   Follow Up Note   06/09/2019 Name: Dominic [name] MRN: [id number] DOB: 02/17/1976  Referred by: Venita Lick, NP Reason for referral : Care Coordination  Dominic [name] is a 43 y.o. year old male who is a primary care patient of Cannady, Barbaraann Faster, NP. The care management team was consulted for assistance with care management and care coordination needs.    Review of patient status, including review of consultants reports, relevant laboratory and other test results, and collaboration with appropriate care team members and the patient's provider was performed as part of comprehensive patient evaluation and provision of chronic care management services.    LCSW completed CCM outreach attempt today but was unable to reach patient successfully. A HIPPA compliant voice message was left encouraging patient to return call once available. LCSW rescheduled CCM SW appointment as well.  A HIPPA compliant phone message was left for the patient providing contact information and requesting a return call.   Eula Fried, BSW, MSW, [facility] Care Management [name] Phone: [phone]

## 2019-06-16 ENCOUNTER — Other Ambulatory Visit: Payer: Self-pay

## 2019-06-16 ENCOUNTER — Emergency Department: Payer: Medicaid Other

## 2019-06-16 ENCOUNTER — Encounter: Payer: Self-pay | Admitting: Emergency Medicine

## 2019-06-16 ENCOUNTER — Emergency Department
Admission: EM | Admit: 2019-06-16 | Discharge: 2019-06-16 | Disposition: A | Discharge status: Home / Self Care | Payer: Medicaid Other | Attending: Emergency Medicine | Admitting: Emergency Medicine

## 2019-06-16 DIAGNOSIS — S0993XA Unspecified injury of face, initial encounter: Secondary | ICD-10-CM | POA: Diagnosis not present

## 2019-06-16 DIAGNOSIS — Y999 Unspecified external cause status: Secondary | ICD-10-CM | POA: Insufficient documentation

## 2019-06-16 DIAGNOSIS — R5383 Other fatigue: Secondary | ICD-10-CM | POA: Insufficient documentation

## 2019-06-16 DIAGNOSIS — F17210 Nicotine dependence, cigarettes, uncomplicated: Secondary | ICD-10-CM | POA: Insufficient documentation

## 2019-06-16 DIAGNOSIS — Y9389 Activity, other specified: Secondary | ICD-10-CM | POA: Insufficient documentation

## 2019-06-16 DIAGNOSIS — Z23 Encounter for immunization: Secondary | ICD-10-CM | POA: Insufficient documentation

## 2019-06-16 DIAGNOSIS — M79602 Pain in left arm: Secondary | ICD-10-CM | POA: Insufficient documentation

## 2019-06-16 DIAGNOSIS — J45909 Unspecified asthma, uncomplicated: Secondary | ICD-10-CM | POA: Diagnosis not present

## 2019-06-16 DIAGNOSIS — M542 Cervicalgia: Secondary | ICD-10-CM | POA: Diagnosis not present

## 2019-06-16 DIAGNOSIS — Y92410 Unspecified street and highway as the place of occurrence of the external cause: Secondary | ICD-10-CM | POA: Diagnosis not present

## 2019-06-16 DIAGNOSIS — R0789 Other chest pain: Secondary | ICD-10-CM | POA: Insufficient documentation

## 2019-06-16 DIAGNOSIS — S01511A Laceration without foreign body of lip, initial encounter: Secondary | ICD-10-CM | POA: Insufficient documentation

## 2019-06-16 DIAGNOSIS — S098XXA Other specified injuries of head, initial encounter: Secondary | ICD-10-CM | POA: Diagnosis present

## 2019-06-16 LAB — ETHANOL: Alcohol, Ethyl (B): <10 mg/dL (ref <10)

## 2019-06-16 LAB — URINALYSIS, COMPLETE (UACMP) WITH MICROSCOPIC
Bacteria, UA: NONE SEEN
Bilirubin Urine: NEGATIVE
Glucose, UA: NEGATIVE mg/dL
Hgb urine dipstick: NEGATIVE
Ketones, ur: NEGATIVE mg/dL
Nitrite: NEGATIVE
Protein, ur: NEGATIVE mg/dL
Specific Gravity, Urine: 1.023 (ref 1.005–1.030)
pH: 5 (ref 5.0–8.0)

## 2019-06-16 LAB — CBC WITH DIFFERENTIAL/PLATELET
Abs Immature Granulocytes: 0.03 10*3/uL (ref 0.00–0.07)
Basophils Absolute: 0 10*3/uL (ref 0.0–0.1)
Basophils Relative: 0 %
Eosinophils Absolute: 0.1 10*3/uL (ref 0.0–0.5)
Eosinophils Relative: 1 %
HCT: 39.1 % (ref 39.0–52.0)
Hemoglobin: 14.1 g/dL (ref 13.0–17.0)
Immature Granulocytes: 0 %
Lymphocytes Relative: 24 %
Lymphs Abs: 2.7 10*3/uL (ref 0.7–4.0)
MCH: 30.1 pg (ref 26.0–34.0)
MCHC: 36.1 g/dL — ABNORMAL HIGH (ref 30.0–36.0)
MCV: 83.4 fL (ref 80.0–100.0)
Monocytes Absolute: 0.9 10*3/uL (ref 0.1–1.0)
Monocytes Relative: 8 %
Neutro Abs: 7.7 10*3/uL (ref 1.7–7.7)
Neutrophils Relative %: 67 %
Platelets: 199 10*3/uL (ref 150–400)
RBC: 4.69 MIL/uL (ref 4.22–5.81)
RDW: 14.1 % (ref 11.5–15.5)
WBC: 11.5 10*3/uL — ABNORMAL HIGH (ref 4.0–10.5)
nRBC: 0 % (ref 0.0–0.2)

## 2019-06-16 LAB — URINE DRUG SCREEN, QUALITATIVE (ARMC ONLY)
Amphetamines, Ur Screen: NOT DETECTED
Barbiturates, Ur Screen: NOT DETECTED
Benzodiazepine, Ur Scrn: NOT DETECTED
Cannabinoid 50 Ng, Ur ~~LOC~~: NOT DETECTED
Cocaine Metabolite,Ur ~~LOC~~: POSITIVE — AB
MDMA (Ecstasy)Ur Screen: NOT DETECTED
Methadone Scn, Ur: NOT DETECTED
Opiate, Ur Screen: NOT DETECTED
Phencyclidine (PCP) Ur S: NOT DETECTED
Tricyclic, Ur Screen: NOT DETECTED

## 2019-06-16 LAB — BASIC METABOLIC PANEL
Anion gap: 5 (ref 5–15)
BUN: 11 mg/dL (ref 6–20)
CO2: 26 mmol/L (ref 22–32)
Calcium: 8.5 mg/dL — ABNORMAL LOW (ref 8.9–10.3)
Chloride: 106 mmol/L (ref 98–111)
Creatinine, Ser: 0.86 mg/dL (ref 0.61–1.24)
GFR calc Af Amer: >60 mL/min (ref >60)
GFR calc non Af Amer: >60 mL/min (ref >60)
Glucose, Bld: 98 mg/dL (ref 70–99)
Potassium: 3.6 mmol/L (ref 3.5–5.1)
Sodium: 137 mmol/L (ref 135–145)

## 2019-06-16 MED ORDER — KETOROLAC TROMETHAMINE 30 MG/ML IJ SOLN
30.0000 mg | Freq: Once | INTRAMUSCULAR | Status: AC
  Administered 2019-06-16: 30 mg via INTRAVENOUS
  Filled 2019-06-16: qty 1

## 2019-06-16 MED ORDER — CEPHALEXIN 500 MG PO CAPS: 500.0000 mg | ORAL_CAPSULE | Freq: Three times a day (TID) | ORAL | 0 refills | Status: DC

## 2019-06-16 MED ORDER — LIDOCAINE HCL (PF) 1 % IJ SOLN
10.0000 mL | Freq: Once | INTRAMUSCULAR | Status: AC
  Administered 2019-06-16: 10 mL
  Filled 2019-06-16: qty 10

## 2019-06-16 MED ORDER — SODIUM CHLORIDE 0.9 % IV SOLN
1.0000 g | Freq: Once | INTRAVENOUS | Status: AC
  Administered 2019-06-16: 1 g via INTRAVENOUS
  Filled 2019-06-16: qty 10

## 2019-06-16 MED ORDER — TETANUS-DIPHTH-ACELL PERTUSSIS 5-2.5-18.5 LF-MCG/0.5 IM SUSP
0.5000 mL | Freq: Once | INTRAMUSCULAR | Status: AC
  Administered 2019-06-16: 0.5 mL via INTRAMUSCULAR
  Filled 2019-06-16: qty 0.5

## 2019-06-16 MED ORDER — IOHEXOL 300 MG/ML  SOLN
100.0000 mL | Freq: Once | INTRAMUSCULAR | Status: AC | PRN
  Administered 2019-06-16: 100 mL via INTRAVENOUS
  Filled 2019-06-16: qty 100
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
## 2019-06-16 NOTE — ED Notes (Signed)
Standing in hall  Encouraged to go back to room  Waiting for provider
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
## 2019-06-16 NOTE — ED Notes (Signed)
Patient taken to lobby where his friend is waiting for him.

## 2019-06-16 NOTE — ED Notes (Signed)
Placed back in room  Provider in with pt

## 2019-06-16 NOTE — ED Provider Notes (Signed)
-----------------------------------------   4:58 PM on 06/16/2019 -----------------------------------------  EKG viewed and interpreted by myself shows a normal sinus rhythm at 65 bpm with a narrow QRS, normal axis, normal intervals, no concerning ST changes.   Harvest Dark, MD 06/16/19 1659

## 2019-06-25 ENCOUNTER — Encounter: Payer: Self-pay | Admitting: Emergency Medicine

## 2019-06-25 ENCOUNTER — Emergency Department: Payer: Medicaid Other

## 2019-06-25 ENCOUNTER — Emergency Department
Admission: EM | Admit: 2019-06-25 | Discharge: 2019-06-25 | Disposition: A | Discharge status: Home / Self Care | Payer: Medicaid Other | Attending: Emergency Medicine | Admitting: Emergency Medicine

## 2019-06-25 DIAGNOSIS — R238 Other skin changes: Secondary | ICD-10-CM

## 2019-06-25 DIAGNOSIS — S01511D Laceration without foreign body of lip, subsequent encounter: Secondary | ICD-10-CM | POA: Insufficient documentation

## 2019-06-25 DIAGNOSIS — J45909 Unspecified asthma, uncomplicated: Secondary | ICD-10-CM | POA: Diagnosis not present

## 2019-06-25 DIAGNOSIS — F17210 Nicotine dependence, cigarettes, uncomplicated: Secondary | ICD-10-CM | POA: Insufficient documentation

## 2019-06-25 DIAGNOSIS — R05 Cough: Secondary | ICD-10-CM | POA: Insufficient documentation

## 2019-06-25 DIAGNOSIS — S1081XD Abrasion of other specified part of neck, subsequent encounter: Secondary | ICD-10-CM | POA: Diagnosis not present

## 2019-06-25 DIAGNOSIS — S0001XD Abrasion of scalp, subsequent encounter: Secondary | ICD-10-CM | POA: Insufficient documentation

## 2019-06-25 DIAGNOSIS — Z79899 Other long term (current) drug therapy: Secondary | ICD-10-CM | POA: Diagnosis not present

## 2019-06-25 DIAGNOSIS — Z4802 Encounter for removal of sutures: Secondary | ICD-10-CM

## 2019-06-25 DIAGNOSIS — T148XXA Other injury of unspecified body region, initial encounter: Secondary | ICD-10-CM

## 2019-06-25 DIAGNOSIS — R059 Cough, unspecified: Secondary | ICD-10-CM

## 2019-06-25 MED ORDER — DEXAMETHASONE SODIUM PHOSPHATE 10 MG/ML IJ SOLN
4.0000 mg | Freq: Once | INTRAMUSCULAR | Status: AC
  Administered 2019-06-25: 12:00:00 4 mg via INTRAMUSCULAR
  Filled 2019-06-25: qty 1

## 2019-06-25 NOTE — Discharge Instructions (Addendum)
Clean area behind her ear and on your neck daily with mild soap and water.  Use mild soap when washing your hair such as Dove or Johnson's baby shampoo.  The shot that you received in the ED should decrease irritation of your scalp.  Also you may apply Neosporin to the skin that is peeling off behind your left ear.  This area does not appear to be infected.  Chest x-ray did not show any pneumonia.  You should however discontinue smoking.

## 2019-06-25 NOTE — ED Notes (Signed)
4 sutures removed by this RN
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
## 2019-07-14 ENCOUNTER — Emergency Department
Admission: EM | Admit: 2019-07-14 | Discharge: 2019-07-15 | Disposition: A | Discharge status: Other Acute Inpatient Hospital | Payer: Medicaid Other | Attending: Emergency Medicine | Admitting: Emergency Medicine

## 2019-07-14 ENCOUNTER — Other Ambulatory Visit: Payer: Self-pay

## 2019-07-14 DIAGNOSIS — J45909 Unspecified asthma, uncomplicated: Secondary | ICD-10-CM | POA: Diagnosis not present

## 2019-07-14 DIAGNOSIS — F17210 Nicotine dependence, cigarettes, uncomplicated: Secondary | ICD-10-CM | POA: Insufficient documentation

## 2019-07-14 DIAGNOSIS — F1410 Cocaine abuse, uncomplicated: Secondary | ICD-10-CM | POA: Diagnosis not present

## 2019-07-14 DIAGNOSIS — F419 Anxiety disorder, unspecified: Secondary | ICD-10-CM | POA: Diagnosis not present

## 2019-07-14 DIAGNOSIS — Z8659 Personal history of other mental and behavioral disorders: Secondary | ICD-10-CM

## 2019-07-14 DIAGNOSIS — Z79899 Other long term (current) drug therapy: Secondary | ICD-10-CM | POA: Insufficient documentation

## 2019-07-14 DIAGNOSIS — Z20828 Contact with and (suspected) exposure to other viral communicable diseases: Secondary | ICD-10-CM | POA: Diagnosis not present

## 2019-07-14 DIAGNOSIS — F209 Schizophrenia, unspecified: Secondary | ICD-10-CM | POA: Insufficient documentation

## 2019-07-14 DIAGNOSIS — R45851 Suicidal ideations: Secondary | ICD-10-CM | POA: Insufficient documentation

## 2019-07-14 DIAGNOSIS — F329 Major depressive disorder, single episode, unspecified: Secondary | ICD-10-CM | POA: Insufficient documentation

## 2019-07-14 LAB — COMPREHENSIVE METABOLIC PANEL
ALT: 45 U/L — ABNORMAL HIGH (ref 0–44)
AST: 46 U/L — ABNORMAL HIGH (ref 15–41)
Albumin: 4.3 g/dL (ref 3.5–5.0)
Alkaline Phosphatase: 40 U/L (ref 38–126)
Anion gap: 8 (ref 5–15)
BUN: 15 mg/dL (ref 6–20)
CO2: 23 mmol/L (ref 22–32)
Calcium: 9.2 mg/dL (ref 8.9–10.3)
Chloride: 107 mmol/L (ref 98–111)
Creatinine, Ser: 0.67 mg/dL (ref 0.61–1.24)
GFR calc Af Amer: >60 mL/min (ref >60)
GFR calc non Af Amer: >60 mL/min (ref >60)
Glucose, Bld: 112 mg/dL — ABNORMAL HIGH (ref 70–99)
Potassium: 3.5 mmol/L (ref 3.5–5.1)
Sodium: 138 mmol/L (ref 135–145)
Total Bilirubin: 0.9 mg/dL (ref 0.3–1.2)
Total Protein: 8.2 g/dL — ABNORMAL HIGH (ref 6.5–8.1)

## 2019-07-14 LAB — CBC
HCT: 42.2 % (ref 39.0–52.0)
Hemoglobin: 15.2 g/dL (ref 13.0–17.0)
MCH: 29.5 pg (ref 26.0–34.0)
MCHC: 36 g/dL (ref 30.0–36.0)
MCV: 81.9 fL (ref 80.0–100.0)
Platelets: 194 10*3/uL (ref 150–400)
RBC: 5.15 MIL/uL (ref 4.22–5.81)
RDW: 14.2 % (ref 11.5–15.5)
WBC: 11.2 10*3/uL — ABNORMAL HIGH (ref 4.0–10.5)
nRBC: 0 % (ref 0.0–0.2)

## 2019-07-14 LAB — ACETAMINOPHEN LEVEL: Acetaminophen (Tylenol), Serum: <10 ug/mL — ABNORMAL LOW (ref 10–30)

## 2019-07-14 LAB — SALICYLATE LEVEL: Salicylate Lvl: <7 mg/dL (ref 2.8–30.0)

## 2019-07-14 LAB — ETHANOL: Alcohol, Ethyl (B): <10 mg/dL (ref <10)

## 2019-07-14 NOTE — ED Notes (Signed)
Pt dressed out by this rn and Writer with bpd. The following items placed in one of one labeled bag: sneakers, yellow t shirt, grey sweat pants, black spiderman wallet.

## 2019-07-14 NOTE — ED Provider Notes (Signed)
[facility] Emergency Department Provider Note  ____________________________________________   First MD Initiated Contact with Patient 07/14/19 2255     (approximate)  I have reviewed the triage vital signs and the nursing notes.   HISTORY  Chief Complaint Suicidal    HPI Dominic [name] is a 43 y.o. male with schizophrenia, depression, hepatitis C who presents with suicidal thoughts.  Patient is voluntary.  Patient is also hearing voices telling him to use drugs and to kill himself.  Patient says the voices started today.  The voices are severe, constant, nothing makes them better, they come on because he is not been taking his medications for the past 1 month because he ran out.  He does not have a an otherwise discrete plan of what he would do to kill himself.  He denies any HI or visual hallucinations.  He denies any other medical conditions.  He denies fevers, abdominal pain, dysuria, chest pain or any other concerns.  Patient does use alcohol and cocaine but last used yesterday.  Denies a history of withdrawal            Past Medical History:  Diagnosis Date   Anxiety    Asthma    Bipolar 1 disorder (HCC)    Chronic hepatitis C without hepatic coma (HCC) 05/28/2018   Depression    Hepatitis C    Hepatitis C antibody positive in blood 05/28/2018   Schizophrenia (HCC)    Sleep apnea     Patient Active Problem List   Diagnosis Date Noted   Schizophrenia (HCC) 05/22/2019   Schizophrenia, chronic condition (HCC) 01/14/2019   Alcohol intoxication, uncomplicated (HCC) 01/13/2019   Asthma 11/25/2018   Nicotine dependence, cigarettes, uncomplicated 11/25/2018   Chronic hepatitis C without hepatic coma (HCC) 05/28/2018   Psychosis (HCC) 02/16/2016   Alcohol abuse 02/16/2016   Cocaine abuse (HCC) 02/16/2016    Past Surgical History:  Procedure Laterality Date   CYST REMOVAL NECK     neck    Prior to Admission  medications   Medication Sig Start Date End Date Taking? Authorizing Provider  albuterol (VENTOLIN HFA) 108 (90 Base) MCG/ACT inhaler Inhale 1-2 puffs into the lungs every 6 (six) hours as needed for wheezing or shortness of breath. 05/23/19   Clapacs, Jackquline Denmark, MD  ARIPiprazole ER (ABILIFY MAINTENA) 400 MG SRER injection Inject 2 mLs (400 mg total) into the muscle every 28 (twenty-eight) days. 06/19/19   Clapacs, Jackquline Denmark, MD  cephALEXin (KEFLEX) 500 MG capsule Take 1 capsule (500 mg total) by mouth 3 (three) times daily. 06/16/19   Menshew, Charlesetta Ivory, PA-C  traZODone (DESYREL) 100 MG tablet Take 1 tablet (100 mg total) by mouth at bedtime as needed for sleep. 05/23/19   Clapacs, Jackquline Denmark, MD    Allergies Patient has no known allergies.  Family History  Problem Relation Age of Onset   Hypertension Sister    Schizophrenia Sister     Social History Social History   Tobacco Use   Smoking status: Current Some Day Smoker    Packs/day: 1.00    Types: Cigarettes   Smokeless tobacco: Never Used  Substance Use Topics   Alcohol use: Yes    Alcohol/week: 3.0 standard drinks    Types: 3 Shots of liquor per week   Drug use: Yes    Types: Cocaine, Marijuana    Comment: last used cocaine Thursday and MJ use daily      Review of Systems Constitutional:  No fever/chills Eyes: No visual changes. ENT: No sore throat. Cardiovascular: Denies chest pain. Respiratory: Denies shortness of breath. Gastrointestinal: No abdominal pain.  No nausea, no vomiting.  No diarrhea.  No constipation. Genitourinary: Negative for dysuria. Musculoskeletal: Negative for back pain. Skin: Negative for rash. Neurological: Negative for headaches, focal weakness or numbness. Psych: SI All other ROS negative ____________________________________________   PHYSICAL EXAM:  VITAL SIGNS: ED Triage Vitals [07/14/19 2231]  Enc Vitals Group     BP 139/78     Pulse Rate 89     Resp 16     Temp 98.8 F (37.1 C)       Temp Source Oral     SpO2 97 %     Weight 227 lb (103 kg)     Height 5\' 11"  (1.803 m)     Head Circumference      Peak Flow      Pain Score 0     Pain Loc      Pain Edu?      Excl. in GC?     Constitutional: Alert and oriented. Well appearing and in no acute distress. Eyes: Conjunctivae are normal. EOMI. Head: Atraumatic. Nose: No congestion/rhinnorhea. Mouth/Throat: Mucous membranes are moist.   Neck: No stridor. Trachea Midline. FROM Cardiovascular: Normal rate, regular rhythm. Grossly normal heart sounds.  Good peripheral circulation. Respiratory: Normal respiratory effort.  No retractions. Lungs CTAB. Gastrointestinal: Soft and nontender. No distention. No abdominal bruits.  Musculoskeletal: No lower extremity tenderness nor edema.  No joint effusions. Neurologic:  Normal speech and language. No gross focal neurologic deficits are appreciated.  Skin:  Skin is warm, dry and intact. No rash noted. Psychiatric: SI with command hallucinations GU: Deferred   ____________________________________________   LABS (all labs ordered are listed, but only abnormal results are displayed)  Labs Reviewed  COMPREHENSIVE METABOLIC PANEL - Abnormal; Notable for the following components:      Result Value   Glucose, Bld 112 (*)    Total Protein 8.2 (*)    AST 46 (*)    ALT 45 (*)    All other components within normal limits  CBC - Abnormal; Notable for the following components:   WBC 11.2 (*)    All other components within normal limits  ETHANOL  SALICYLATE LEVEL  ACETAMINOPHEN LEVEL  URINE DRUG SCREEN, QUALITATIVE ([facility] ONLY)   ____________________________________________    Procedure(s) performed (including Critical Care):  Procedures   ____________________________________________   INITIAL IMPRESSION / ASSESSMENT AND PLAN / ED COURSE  Dominic [name] was evaluated in Emergency Department on 07/14/2019 for the symptoms described in the history of present  illness. He was evaluated in the context of the global COVID-19 pandemic, which necessitated consideration that the patient might be at risk for infection with the SARS-CoV-2 virus that causes COVID-19. Institutional protocols and algorithms that pertain to the evaluation of patients at risk for COVID-19 are in a state of rapid change based on information released by regulatory bodies including the CDC and federal and state organizations. These policies and algorithms were followed during the patient's care in the ED.    Pt is without any acute medical complaints. No exam findings to suggest medical cause of current presentation. Will order psychiatric screening labs and discuss further w/ psychiatric service.  D/d includes but is not limited to psychiatric disease, behavioral/personality disorder, inadequate socioeconomic support, medical.  Based on HPI, exam, unremarkable labs, no concern for acute medical problem at this time. No rigidity, clonus,  hyperthermia, focal neurologic deficit, diaphoresis, tachycardia, meningismus, ataxia, gait abnormality or other finding to suggest this visit represents a non-psychiatric problem. Screening labs reviewed.    Given this, pt medically cleared, to be dispositioned per Psych.  Patient's labs are reassuring with negative ethanol level.  White count slightly elevated but no evidence of infection.       ____________________________________________   FINAL CLINICAL IMPRESSION(S) / ED DIAGNOSES   Final diagnoses:  Suicide ideation  History of command hallucinations      MEDICATIONS GIVEN DURING THIS VISIT:  Medications - No data to display   ED Discharge Orders    None       Note:  This document was prepared using Dragon voice recognition software and may include unintentional dictation errors.   Vanessa Wixom, MD 07/14/19 2337

## 2019-07-14 NOTE — ED Notes (Signed)
Belongings placed at nurse's station with gary, rn.

## 2019-07-14 NOTE — ED Notes (Signed)
Pt. Transferred from Triage to room 23 after dressing out and screening for contraband. Report to include Situation, Background, Assessment and Recommendations from April RN. Pt. Oriented to Quad including Q15 minute rounds as well as Engineer, drilling for their protection. Patient is alert and oriented, warm and dry in no acute distress. Patient denies SI, HI, and VH. Patient states he hears voices telling him to "kill myself". Pt. Encouraged to let me know if needs arise.

## 2019-07-14 NOTE — ED Triage Notes (Signed)
Pt here voluntarily, states is suicidal. Pt states he is also hearing voices telling him to use drugs. Pt appears in no acute distress and is cooperative.

## 2019-07-15 ENCOUNTER — Inpatient Hospital Stay
Admission: AD | Admit: 2019-07-15 | Discharge: 2019-07-16 | DRG: 885 | Disposition: A | Discharge status: Home / Self Care | Payer: Medicaid Other | Source: Intra-hospital | Attending: Psychiatry | Admitting: Psychiatry

## 2019-07-15 ENCOUNTER — Other Ambulatory Visit: Payer: Self-pay

## 2019-07-15 DIAGNOSIS — Z8659 Personal history of other mental and behavioral disorders: Secondary | ICD-10-CM | POA: Insufficient documentation

## 2019-07-15 DIAGNOSIS — Z7289 Other problems related to lifestyle: Secondary | ICD-10-CM | POA: Diagnosis not present

## 2019-07-15 DIAGNOSIS — F1410 Cocaine abuse, uncomplicated: Secondary | ICD-10-CM

## 2019-07-15 DIAGNOSIS — F209 Schizophrenia, unspecified: Secondary | ICD-10-CM

## 2019-07-15 DIAGNOSIS — F17210 Nicotine dependence, cigarettes, uncomplicated: Secondary | ICD-10-CM | POA: Diagnosis present

## 2019-07-15 DIAGNOSIS — Z20828 Contact with and (suspected) exposure to other viral communicable diseases: Secondary | ICD-10-CM | POA: Diagnosis present

## 2019-07-15 DIAGNOSIS — F259 Schizoaffective disorder, unspecified: Secondary | ICD-10-CM | POA: Diagnosis present

## 2019-07-15 DIAGNOSIS — F329 Major depressive disorder, single episode, unspecified: Secondary | ICD-10-CM | POA: Diagnosis not present

## 2019-07-15 DIAGNOSIS — F322 Major depressive disorder, single episode, severe without psychotic features: Secondary | ICD-10-CM | POA: Insufficient documentation

## 2019-07-15 DIAGNOSIS — R45851 Suicidal ideations: Secondary | ICD-10-CM | POA: Diagnosis present

## 2019-07-15 LAB — SARS CORONAVIRUS 2 BY RT PCR (HOSPITAL ORDER, PERFORMED IN ~~LOC~~ HOSPITAL LAB): SARS Coronavirus 2: NEGATIVE

## 2019-07-15 MED ORDER — TRAZODONE HCL 100 MG PO TABS
100.0000 mg | ORAL_TABLET | Freq: Every evening | ORAL | Status: DC | PRN
  Administered 2019-07-15: 21:00:00 100 mg via ORAL
  Filled 2019-07-15: qty 1

## 2019-07-15 MED ORDER — ALBUTEROL SULFATE HFA 108 (90 BASE) MCG/ACT IN AERS
1.0000 | INHALATION_SPRAY | Freq: Four times a day (QID) | RESPIRATORY_TRACT | Status: DC | PRN
  Filled 2019-07-15: qty 6.7

## 2019-07-15 MED ORDER — ARIPIPRAZOLE ER 400 MG IM SRER
400.0000 mg | Freq: Once | INTRAMUSCULAR | Status: AC
  Administered 2019-07-15: 400 mg via INTRAMUSCULAR
  Filled 2019-07-15: qty 2

## 2019-07-15 MED ORDER — ACETAMINOPHEN 325 MG PO TABS: 650.0000 mg | ORAL_TABLET | Freq: Four times a day (QID) | ORAL | Status: DC | PRN

## 2019-07-15 MED ORDER — ALUM & MAG HYDROXIDE-SIMETH 200-200-20 MG/5ML PO SUSP: 30.0000 mL | ORAL | Status: DC | PRN

## 2019-07-15 MED ORDER — MAGNESIUM HYDROXIDE 400 MG/5ML PO SUSP: 30.0000 mL | Freq: Every day | ORAL | Status: DC | PRN

## 2019-07-15 NOTE — ED Notes (Signed)
Patient is to be admitted to [facility] by Psychiatric Nurse Practitioner Clapacs.  Attending Physician will be Dr. Weber Cooks.   Patient has been assigned to room 320, by [facility] Charge Nurse.   Intake Paper Work has been signed and placed on patient chart.  ER staff is aware of the admission:  ER Secretary    Dr.  ER MD   Dominica Severin Patient's Nurse   Patient Access.

## 2019-07-15 NOTE — ED Notes (Signed)
Hourly rounding reveals patient in room. No complaints, stable, in no acute distress. Q15 minute rounds and monitoring via Rover and Officer to continue.   

## 2019-07-15 NOTE — Progress Notes (Signed)
Recreation Therapy Notes  INPATIENT RECREATION THERAPY ASSESSMENT  Patient Details Name: Dominic [name] MRN: [id number] DOB: [birth year] Today's Date: 07/15/2019       Information Obtained From: Patient  Able to Participate in Assessment/Interview: Yes  Patient Presentation: Responsive  Reason for Admission (Per Patient): Active Symptoms, Substance Abuse  Patient Stressors:    Coping Skills:   Meditate, Substance Abuse, Talk  Leisure Interests (2+):  Exercise - Walking, Exercise - Running, Exercise - Lifting Weights  Frequency of Recreation/Participation: Weekly  Awareness of Community Resources:     Community Resources:     Current Use:    If no, Barriers?:    Expressed Interest in [employer] Information:    [place] of Residence:  Insurance underwriter  Patient Main Form of Transportation: Walk  Patient Strengths:  I am a Scientist, research (physical sciences)  Patient Identified Areas of Improvement:  Stop doing drugs  Patient Goal for Hospitalization:  Get help  Current SI (including self-harm):  No  Current HI:  No  Current AVH: No  Staff Intervention Plan: Group Attendance, Collaborate with Interdisciplinary Treatment Team  Consent to Intern Participation: N/A  Talene Glastetter 07/15/2019, 2:45 PM

## 2019-07-15 NOTE — BHH Suicide Risk Assessment (Signed)
[place] INPATIENT:  Family/Significant Other Suicide Prevention Education  Suicide Prevention Education:  Patient Refusal for Family/Significant Other Suicide Prevention Education: The patient Dominic [name] has refused to provide written consent for family/significant other to be provided Family/Significant Other Suicide Prevention Education during admission and/or prior to discharge.  Physician notified.  Helmi Hechavarria T Ansel Ferrall 07/15/2019, 10:59 AM

## 2019-07-15 NOTE — Plan of Care (Signed)
Patient  aware of information received , able to verbalize understanding of information received . Emotional and mental status  improved. Limited interaction with unit programing . Voice concerns around sleeping at night . Encourage participation with  group process  to be able to access  resources  availability.  Voice of no safety  concerns . Working on Teacher, early years/pre   and anxiety . Interaction with his peers  for improved socialization . Voice of  no safety concerns    Problem: Education: Goal: Utilization of techniques to improve thought processes will improve Outcome: Progressing Goal: Knowledge of the prescribed therapeutic regimen will improve Outcome: Progressing   Problem: Activity: Goal: Interest or engagement in leisure activities will improve Outcome: Progressing Goal: Imbalance in normal sleep/wake cycle will improve Outcome: Progressing   Problem: Coping: Goal: Coping ability will improve Outcome: Progressing Goal: Will verbalize feelings Outcome: Progressing   Problem: Role Relationship: Goal: Will demonstrate positive changes in social behaviors and relationships Outcome: Progressing   Problem: Safety: Goal: Ability to disclose and discuss suicidal ideas will improve Outcome: Progressing Goal: Ability to identify and utilize support systems that promote safety will improve Outcome: Progressing   Problem: Self-Concept: Goal: Will verbalize positive feelings about self Outcome: Progressing Goal: Level of anxiety will decrease Outcome: Progressing   Problem: Education: Goal: Ability to state activities that reduce stress will improve Outcome: Progressing   Problem: Coping: Goal: Ability to identify and develop effective coping behavior will improve Outcome: Progressing   Problem: Self-Concept: Goal: Ability to identify factors that promote anxiety will improve Outcome: Progressing Goal: Level of anxiety will decrease Outcome: Progressing Goal:  Ability to modify response to factors that promote anxiety will improve Outcome: Progressing   Problem: Coping: Goal: Communication of feelings regarding changes in body function or appearance will improve Outcome: Progressing   Problem: Health Behavior/Discharge Planning: Goal: Identification of resources available to assist in meeting health care needs will improve Outcome: Progressing   Problem: Self-Concept: Goal: Expressions of positive opinion of body image will increase Outcome: Progressing Goal: Will verbalize positive feelings about self Outcome: Progressing   Problem: Physical Regulation: Goal: Complications related to the disease process, condition or treatment will be avoided or minimized Outcome: Progressing   Problem: Safety: Goal: Ability to remain free from injury will improve Outcome: Progressing

## 2019-07-15 NOTE — Tx Team (Signed)
Initial Treatment Plan 07/15/2019 4:29 AM Dominic [name] YQM:[id number]    PATIENT STRESSORS: Educational concerns Financial difficulties Medication change or noncompliance Occupational concerns   PATIENT STRENGTHS: Ability for insight Average or above average intelligence General fund of knowledge Motivation for treatment/growth   PATIENT IDENTIFIED PROBLEMS: Substance user    Depression/Anxiety    Body Image disturbance             DISCHARGE CRITERIA:  Ability to meet basic life and health needs Improved stabilization in mood, thinking, and/or behavior Medical problems require only outpatient monitoring Motivation to continue treatment in a less acute level of care  PRELIMINARY DISCHARGE PLAN: Attend aftercare/continuing care group Attend 12-step recovery group Participate in family therapy Return to previous living arrangement  PATIENT/FAMILY INVOLVEMENT: This treatment plan has been presented to and reviewed with the patient, Dominic [name], .  The patient  have been given the opportunity to ask questions and make suggestions.  Clemens Catholic, RN 07/15/2019, 4:29 AM

## 2019-07-15 NOTE — Progress Notes (Signed)
Recreation Therapy Notes    Date: 07/15/2019  Time: 9:30 am   Location: Craft room   Behavioral response: N/A   Intervention Topic: Necessities    Discussion/Intervention: Patient did not attend group.   Clinical Observations/Feedback:  Patient did not attend group.   [name] LRT/CTRS        Dominic [name] 07/15/2019 11:16 AM

## 2019-07-15 NOTE — ED Notes (Signed)
Hourly rounding reveals patient sleeping in room. No complaints, stable, in no acute distress. Q15 minute rounds and monitoring via Rover and Officer to continue.  

## 2019-07-15 NOTE — BHH Group Notes (Signed)
  LCSW Group Therapy Note  07/15/2019 12:21 PM   Type of Therapy/Topic:  Group Therapy:  Feelings about Diagnosis  Participation Level:  Did Not Attend   Description of Group:   This group will allow patients to explore their thoughts and feelings about diagnoses they have received. Patients will be guided to explore their level of understanding and acceptance of these diagnoses. Facilitator will encourage patients to process their thoughts and feelings about the reactions of others to their diagnosis and will guide patients in identifying ways to discuss their diagnosis with significant others in their lives. This group will be process-oriented, with patients participating in exploration of their own experiences, giving and receiving support, and processing challenge from other group members.   Therapeutic Goals: 1. Patient will demonstrate understanding of diagnosis as evidenced by identifying two or more symptoms of the disorder 2. Patient will be able to express two feelings regarding the diagnosis 3. Patient will demonstrate their ability to communicate their needs through discussion and/or role play  Summary of Patient Progress: x   Therapeutic Modalities:   Cognitive Behavioral Therapy Brief Therapy Feelings Identification    Evalina Field, MSW, LCSW Clinical Social Work 07/15/2019 12:21 PM
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
## 2019-07-15 NOTE — Progress Notes (Signed)
D: Patient stated slept fair last night .Stated appetite  fair and energy level low. Stated concentration poor. Stated on Depression scale 7 , hopeless 7 and anxiety 7 .( low 0-10 high) Denies suicidal  homicidal ideations  .  No auditory hallucinations  No pain concerns . Appropriate ADL'S. Interacting with peers and staff.Patient  aware of information received , able to verbalize understanding of information received . Emotional and mental status  improved. Limited interaction with unit programing . Voice concerns around sleeping at night . Encourage participation with  group process  to be able to access  resources  availability.  Voice of no safety  concerns . Working on Teacher, early years/pre     A: Encourage patient participation with unit programming . Instruction  Given on  Medication , verbalize understanding.  R: Voice no other concerns. Staff continue to monitor

## 2019-07-15 NOTE — H&P (Signed)
Psychiatric Admission Assessment Adult  Patient Identification: Dominic SloughCedric Deon Offerdahl MRN:  [id number] Date of Evaluation:  07/15/2019 Chief Complaint:  schizophrenia Principal Diagnosis: Schizophrenia, chronic condition (HCC) Diagnosis:  Principal Problem:   Schizophrenia, chronic condition (HCC) Active Problems:   Cocaine abuse (HCC)  History of Present Illness: Patient seen and chart reviewed.  Patient well-known from previous encounters.  This is a gentleman with schizophrenia who also has chronic problems with alcohol and drug abuse.  He comes to the hospital voluntarily this time saying that he had been off of his antipsychotic and started to hear voices.  He last got his Abilify injection over a month ago and said that for several days he did not sleep at all and started to hear voices talking with him.  Passive suicidal thoughts but no intention or plan.  Mood irritable.  He denies that he had been drinking but admits that he had been using cocaine with last use just a couple days ago.  He is currently cooperative and requesting to be back on his antipsychotic. Associated Signs/Symptoms: Depression Symptoms:  psychomotor retardation, difficulty concentrating, (Hypo) Manic Symptoms:  Distractibility, Anxiety Symptoms:  Nonspecific Psychotic Symptoms:  Hallucinations: Auditory PTSD Symptoms: Negative Total Time spent with patient: 1 hour  Past Psychiatric History: Patient has a history of multiple hospitalizations often under IVC but this time voluntarily.  He is on chronic Abilify treatment with long-acting injectable.  He does have a past history of aggression and suicidal statements but generally wants his substance abuse binges over he calms down well.  Is the patient at risk to self? Yes.    Has the patient been a risk to self in the past 6 months? Yes.    Has the patient been a risk to self within the distant past? Yes.    Is the patient a risk to others? No.  Has the patient been  a risk to others in the past 6 months? No.  Has the patient been a risk to others within the distant past? No.   Prior Inpatient Therapy:   Prior Outpatient Therapy:    Alcohol Screening: 1. How often do you have a drink containing alcohol?: 2 to 4 times a month 2. How many drinks containing alcohol do you have on a typical day when you are drinking?: 3 or 4 3. How often do you have six or more drinks on one occasion?: Less than monthly AUDIT-C Score: 4 4. How often during the last year have you found that you were not able to stop drinking once you had started?: Less than monthly 5. How often during the last year have you failed to do what was normally expected from you becasue of drinking?: Less than monthly 6. How often during the last year have you needed a first drink in the morning to get yourself going after a heavy drinking session?: Less than monthly 7. How often during the last year have you had a feeling of guilt of remorse after drinking?: Less than monthly 8. How often during the last year have you been unable to remember what happened the night before because you had been drinking?: Less than monthly 9. Have you or someone else been injured as a result of your drinking?: No 10. Has a relative or friend or a doctor or another health worker been concerned about your drinking or suggested you cut down?: No Alcohol Use Disorder Identification Test Final Score (AUDIT): 9 Alcohol Brief Interventions/Follow-up: Alcohol Education Substance Abuse History in  the last 12 months:  Yes.   Consequences of Substance Abuse: Medical Consequences:  Worsening schizophrenia Previous Psychotropic Medications: Yes  Psychological Evaluations: Yes  Past Medical History:  Past Medical History:  Diagnosis Date  . Anxiety   . Asthma   . Bipolar 1 disorder (HCC)   . Chronic hepatitis C without hepatic coma (HCC) 05/28/2018  . Depression   . Hepatitis C   . Hepatitis C antibody positive in blood  05/28/2018  . Schizophrenia (HCC)   . Sleep apnea     Past Surgical History:  Procedure Laterality Date  . CYST REMOVAL NECK     neck   Family History:  Family History  Problem Relation Age of Onset  . Hypertension Sister   . Schizophrenia Sister    Family Psychiatric  History: Sister with schizophrenia Tobacco Screening: Have you used any form of tobacco in the last 30 days? (Cigarettes, Smokeless Tobacco, Cigars, and/or Pipes): Yes Tobacco use, Select all that apply: 5 or more cigarettes per day Are you interested in Tobacco Cessation Medications?: Yes, will notify MD for an order Counseled patient on smoking cessation including recognizing danger situations, developing coping skills and basic information about quitting provided: Yes Social History:  Social History   Substance and Sexual Activity  Alcohol Use Yes  . Alcohol/week: 3.0 standard drinks  . Types: 3 Shots of liquor per week     Social History   Substance and Sexual Activity  Drug Use Yes  . Types: Cocaine, Marijuana   Comment: last used cocaine Thursday and MJ use daily    Additional Social History:                           Allergies:  No Known Allergies Lab Results:  Results for orders placed or performed during the hospital encounter of 07/14/19 (from the past 48 hour(s))  Comprehensive metabolic panel     Status: Abnormal   Collection Time: 07/14/19 10:37 PM  Result Value Ref Range   Sodium 138 135 - 145 mmol/L   Potassium 3.5 3.5 - 5.1 mmol/L   Chloride 107 98 - 111 mmol/L   CO2 23 22 - 32 mmol/L   Glucose, Bld 112 (H) 70 - 99 mg/dL   BUN 15 6 - 20 mg/dL   Creatinine, Ser 8.20 0.61 - 1.24 mg/dL   Calcium 9.2 8.9 - 60.1 mg/dL   Total Protein 8.2 (H) 6.5 - 8.1 g/dL   Albumin 4.3 3.5 - 5.0 g/dL   AST 46 (H) 15 - 41 U/L   ALT 45 (H) 0 - 44 U/L   Alkaline Phosphatase 40 38 - 126 U/L   Total Bilirubin 0.9 0.3 - 1.2 mg/dL   GFR calc non Af Amer >60 >60 mL/min   GFR calc Af Amer >60 >60  mL/min   Anion gap 8 5 - 15    Comment: Performed at [facility], [address] [place], [place] [address]  Ethanol     Status: None   Collection Time: 07/14/19 10:37 PM  Result Value Ref Range   Alcohol, Ethyl (B) <10 <10 mg/dL    Comment: (NOTE) Lowest detectable limit for serum alcohol is 10 mg/dL. For medical purposes only. Performed at [facility], [address] [place], [place] [address]   Salicylate level     Status: None   Collection Time: 07/14/19 10:37 PM  Result Value Ref Range   Salicylate Lvl <7.0 2.8 -  30.0 mg/dL    Comment: Performed at [facility], [address] [place], [place] [address]  Acetaminophen level     Status: Abnormal   Collection Time: 07/14/19 10:37 PM  Result Value Ref Range   Acetaminophen (Tylenol), Serum <10 (L) 10 - 30 ug/mL    Comment: (NOTE) Therapeutic concentrations vary significantly. A range of 10-30 ug/mL  may be an effective concentration for many patients. However, some  are best treated at concentrations outside of this range. Acetaminophen concentrations >150 ug/mL at 4 hours after ingestion  and >50 ug/mL at 12 hours after ingestion are often associated with  toxic reactions. Performed at [facility], [address] [place], [place] [address]   cbc     Status: Abnormal   Collection Time: 07/14/19 10:37 PM  Result Value Ref Range   WBC 11.2 (H) 4.0 - 10.5 K/uL   RBC 5.15 4.22 - 5.81 MIL/uL   Hemoglobin 15.2 13.0 - 17.0 g/dL   HCT 42.2 39.0 - 52.0 %   MCV 81.9 80.0 - 100.0 fL   MCH 29.5 26.0 - 34.0 pg   MCHC 36.0 30.0 - 36.0 g/dL   RDW 14.2 11.5 - 15.5 %   Platelets 194 150 - 400 K/uL   nRBC 0.0 0.0 - 0.2 %    Comment: Performed at [facility], [address] [place], [place] [address]  SARS Coronavirus 2 [facility] order, Performed in [facility] hospital lab) Nasopharyngeal Nasopharyngeal Swab     Status: None   Collection Time: 07/15/19  1:20 AM   Specimen:  Nasopharyngeal Swab  Result Value Ref Range   SARS Coronavirus 2 NEGATIVE NEGATIVE    Comment: (NOTE) If result is NEGATIVE SARS-CoV-2 target nucleic acids are NOT DETECTED. The SARS-CoV-2 RNA is generally detectable in upper and lower  respiratory specimens during the acute phase of infection. The lowest  concentration of SARS-CoV-2 viral copies this assay can detect is 250  copies / mL. A negative result does not preclude SARS-CoV-2 infection  and should not be used as the sole basis for treatment or other  patient management decisions.  A negative result may occur with  improper specimen collection / handling, submission of specimen other  than nasopharyngeal swab, presence of viral mutation(s) within the  areas targeted by this assay, and inadequate number of viral copies  (<250 copies / mL). A negative result must be combined with clinical  observations, patient history, and epidemiological information. If result is POSITIVE SARS-CoV-2 target nucleic acids are DETECTED. The SARS-CoV-2 RNA is generally detectable in upper and lower  respiratory specimens dur ing the acute phase of infection.  Positive  results are indicative of active infection with SARS-CoV-2.  Clinical  correlation with patient history and other diagnostic information is  necessary to determine patient infection status.  Positive results do  not rule out bacterial infection or co-infection with other viruses. If result is PRESUMPTIVE POSTIVE SARS-CoV-2 nucleic acids MAY BE PRESENT.   A presumptive positive result was obtained on the submitted specimen  and confirmed on repeat testing.  While 2019 novel coronavirus  (SARS-CoV-2) nucleic acids may be present in the submitted sample  additional confirmatory testing may be necessary for epidemiological  and / or clinical management purposes  to differentiate between  SARS-CoV-2 and other Sarbecovirus currently known to infect humans.  If clinically indicated  additional testing with an alternate test  methodology [phone]) is advised. The SARS-CoV-2 RNA is generally  detectable in upper  and lower respiratory sp ecimens during the acute  phase of infection. The expected result is Negative. Fact Sheet for Patients:  BoilerBrush.com.cy Fact Sheet for Healthcare Providers: https://pope.com/ This test is not yet approved or cleared by the [place] FDA and has been authorized for detection and/or diagnosis of SARS-CoV-2 by FDA under an Emergency Use Authorization (EUA).  This EUA will remain in effect (meaning this test can be used) for the duration of the COVID-19 declaration under Section 564(b)(1) of the Act, 21 U.S.C. section 360bbb-3(b)(1), unless the authorization is terminated or revoked sooner. Performed at [facility], [address] [place], [place] [address]     Blood Alcohol level:  Lab Results  Component Value Date   [facility] <10 07/14/2019   ETH <10 06/16/2019    Metabolic Disorder Labs:  Lab Results  Component Value Date   HGBA1C 5.3 04/08/2018   MPG 105.41 04/08/2018   No results found for: PROLACTIN Lab Results  Component Value Date   CHOL 150 04/08/2018   TRIG 126 04/08/2018   HDL 44 04/08/2018   CHOLHDL 3.4 04/08/2018   VLDL 25 04/08/2018   LDLCALC 81 04/08/2018    Current Medications: Current Facility-Administered Medications  Medication Dose Route Frequency Provider Last Rate Last Dose  . acetaminophen (TYLENOL) tablet 650 mg  650 mg Oral Q6H PRN Gillermo Murdoch, NP      . albuterol (VENTOLIN HFA) 108 (90 Base) MCG/ACT inhaler 1-2 puff  1-2 puff Inhalation Q6H PRN Gillermo Murdoch, NP      . alum & mag hydroxide-simeth (MAALOX/MYLANTA) 200-200-20 MG/5ML suspension 30 mL  30 mL Oral Q4H PRN Gillermo Murdoch, NP      . ARIPiprazole ER (ABILIFY MAINTENA) injection 400 mg  400 mg Intramuscular Once Verlaine Embry T, MD      . magnesium  hydroxide (MILK OF MAGNESIA) suspension 30 mL  30 mL Oral Daily PRN Gillermo Murdoch, NP      . traZODone (DESYREL) tablet 100 mg  100 mg Oral QHS PRN Gillermo Murdoch, NP       PTA Medications: Medications Prior to Admission  Medication Sig Dispense Refill Last Dose  . albuterol (VENTOLIN HFA) 108 (90 Base) MCG/ACT inhaler Inhale 1-2 puffs into the lungs every 6 (six) hours as needed for wheezing or shortness of breath. 6.7 g 1   . ARIPiprazole ER (ABILIFY MAINTENA) 400 MG SRER injection Inject 2 mLs (400 mg total) into the muscle every 28 (twenty-eight) days. 1 each 1   . cephALEXin (KEFLEX) 500 MG capsule Take 1 capsule (500 mg total) by mouth 3 (three) times daily. (Patient not taking: Reported on 07/15/2019) 21 capsule 0   . traZODone (DESYREL) 100 MG tablet Take 1 tablet (100 mg total) by mouth at bedtime as needed for sleep. 30 tablet 0     Musculoskeletal: Strength & Muscle Tone: within normal limits Gait & Station: normal Patient leans: N/A  Psychiatric Specialty Exam: Physical Exam  Nursing note and vitals reviewed. Constitutional: He appears well-developed and well-nourished.  HENT:  Head: Normocephalic and atraumatic.  Eyes: Pupils are equal, round, and reactive to light. Conjunctivae are normal.  Neck: Normal range of motion.  Cardiovascular: Regular rhythm and normal heart sounds.  Respiratory: Effort normal. No respiratory distress.  GI: Soft.  Musculoskeletal: Normal range of motion.  Neurological: He is alert.  Skin: Skin is warm and dry.  Psychiatric: His affect is blunt. His speech is delayed. He is agitated. He is not aggressive. Cognition and memory  are impaired. He expresses impulsivity. He expresses suicidal ideation. He expresses no suicidal plans.    Review of Systems  Constitutional: Negative.   HENT: Negative.   Eyes: Negative.   Respiratory: Negative.   Cardiovascular: Negative.   Gastrointestinal: Negative.   Musculoskeletal: Negative.    Skin: Negative.   Neurological: Negative.   Psychiatric/Behavioral: Positive for depression, hallucinations, substance abuse and suicidal ideas. The patient is nervous/anxious and has insomnia.     Blood pressure 124/73, pulse 70, temperature 97.9 F (36.6 C), temperature source Oral, resp. rate 18, height 5\' 10"  (1.778 m), weight 103 kg, SpO2 94 %.Body mass index is 32.57 kg/m.  General Appearance: Disheveled  Eye Contact:  Minimal  Speech:  Slow  Volume:  Decreased  Mood:  Dysphoric  Affect:  Congruent  Thought Process:  Coherent  Orientation:  Full (Time, Place, and Person)  Thought Content:  Logical and Hallucinations: Auditory  Suicidal Thoughts:  No  Homicidal Thoughts:  No  Memory:  Immediate;   Fair Recent;   Fair Remote;   Fair  Judgement:  Impaired  Insight:  Shallow  Psychomotor Activity:  Decreased  Concentration:  Concentration: Fair  Recall:  [employer]Fair  Fund of Knowledge:  Fair  Language:  Fair  Akathisia:  No  Handed:  Right  AIMS (if indicated):     Assets:  Desire for Improvement Housing Resilience  ADL's:  Intact  Cognition:  WNL  Sleep:  Number of Hours: 0.75    Treatment Plan Summary: Daily contact with patient to assess and evaluate symptoms and progress in treatment, Medication management and Plan Patient will be given his Abilify injection today.  Continue other outpatient medication.  Psychoeducation and counseling completed about the importance of staying on medicine and avoiding drug and alcohol abuse.  He says he feels like he will probably be feeling better within the next day or 2.  Encourage attendance at groups.  Review plan with treatment team prior to discharge.  Observation Level/Precautions:  15 minute checks  Laboratory:  UDS  Psychotherapy:    Medications:    Consultations:    Discharge Concerns:    Estimated LOS:  Other:     Physician Treatment Plan for Primary Diagnosis: Schizophrenia, chronic condition (HCC) Long Term Goal(s):  Improvement in symptoms so as ready for discharge  Short Term Goals: Ability to verbalize feelings will improve and Ability to disclose and discuss suicidal ideas  Physician Treatment Plan for Secondary Diagnosis: Principal Problem:   Schizophrenia, chronic condition (HCC) Active Problems:   Cocaine abuse (HCC)  Long Term Goal(s): Improvement in symptoms so as ready for discharge  Short Term Goals: Compliance with prescribed medications will improve and Ability to identify triggers associated with substance abuse/mental health issues will improve  I certify that inpatient services furnished can reasonably be expected to improve the patient's condition.    Mordecai RasmussenJohn Margaretmary Prisk, MD 9/1/20202:33 PM

## 2019-07-15 NOTE — Progress Notes (Signed)
Admission Note:  43 yr male who presents Voluntary in no acute distress for the treatment of SI, Depression and Suicidal ideation. Patient upon arrival to unit appears flat and sad but he brightens upon approach, affect is flat but he brightens upon approach, he is receptive to staff, calm and cooperative with admission process, he denies SI/HI/AVH and contracts for safety upon admission. Pt explained and he stated " I am off my medicine and I need help to get back on track"   Patient has Past medical Hx of Bipolar, Depression, Anxiety , schizophrenia. Patient's skin was assessed  In presence of Alex RN  It was found to be warm, dry and intact no abnormal marks noted, he was searched and no contraband found, Plan of care,  unit policies explained and understanding verbalized. Consents was obtained. Patient was oriented to unit no distress noted .15 minutes safety checks maintained will continue to monitor  

## 2019-07-15 NOTE — Consult Note (Signed)
[facility] Face-to-Face Psychiatry Consult   Reason for Consult: Suicidal ideation Referring Physician: Dr. Fuller Plan Patient Identification: Dominic [name] MRN:  [id number] Principal Diagnosis: Schizophrenia Diagnosis: Anxiety Bipolar 1 disorder (HCC)  Total Time spent with patient: 1 hour  Subjective: " My girl do this all the time to me. She always putting papers on me." Dominic [name] is a 43 y.o. male patient presented to [facility] ED voluntary.The patient stated he has not been sleeping for 4 to 5 days. The patient was seen face-to-face by this provider; chart reviewed and consulted with Dr. Fuller Plan on 07/14/2019 due to the care of the patient. It was discussed with the provider that the patient does meet the criteria to be admitted to the inpatient unit. On evaluation, the patient is alert and oriented x3, calm and cooperative, and mood-congruent with affect. The patient does not appear to be responding to internal or external stimuli. Neither is the patient presenting with any delusional thinking. The patient admits to auditory hallucinations.  The patient denies visual hallucinations. The patient admits to suicidal ideations but denies homicidal or self-harm ideations. The patient is not presenting with psychotic and paranoid behaviors. The patient admits to not being compliant with his medication regime. The patient takes Abilify Maintena 400 mg intramuscular every 28 days, but does not work for him. He has not had his injection for a while. Trazodone 100 mg at bedtime as needed for sleep. During an encounter with the patient, he answered questions appropriately.  Plan: The patient is a safety risk to self and others and does require psychiatric inpatient admission for stabilization and treatment.  HPI: per Dr. Fuller Plan;  Dominic [name] is a 43 y.o. male with schizophrenia, depression, hepatitis C who presents with suicidal thoughts.  Patient is voluntary.  Patient is also hearing voices telling  him to use drugs and to kill himself.  Patient says the voices started today.  The voices are severe, constant, nothing makes them better, they come on because he is not been taking his medications for the past 1 month because he ran out.  He does not have a an otherwise discrete plan of what he would do to kill himself.  He denies any HI or visual hallucinations.  He denies any other medical conditions.  He denies fevers, abdominal pain, dysuria, chest pain or any other concerns.  Patient does use alcohol and cocaine but last used yesterday.  Denies a history of withdrawal  Past Psychiatric History:  Anxiety Bipolar 1 disorder (HCC) Depression Schizophrenia (HCC) Cocaine abuse (HCC)  Risk to Self: Suicidal Ideation: Yes-Currently Present Suicidal Intent: No Is patient at risk for suicide?: Yes Suicidal Plan?: No Access to Means: No What has been your use of drugs/alcohol within the last 12 months?: Cocaine, alcohol  How many times?: 0 Other Self Harm Risks: none Triggers for Past Attempts: None known Intentional Self Injurious Behavior: None Risk to Others: Homicidal Ideation: No Thoughts of Harm to Others: No Current Homicidal Intent: No Current Homicidal Plan: No Access to Homicidal Means: No Identified Victim: no History of harm to others?: No Assessment of Violence: None Noted Violent Behavior Description: none Does patient have access to weapons?: No Criminal Charges Pending?: No Describe Pending Criminal Charges: none Does patient have a court date: No Prior Inpatient Therapy: Prior Inpatient Therapy: Yes Prior Therapy Dates: 04/2019 Prior Therapy Facilty/Provider(s): [facility] Reason for Treatment: Schizophrenia Prior Outpatient Therapy: Prior Outpatient Therapy: Yes Prior Therapy Dates: Current Prior Therapy Facilty/Provider(s): [facility] Reason for  Treatment: Schizophrenia; Medication Management Does patient have an ACCT team?: No Does patient have Intensive In-House Services?   : No Does patient have [facility] services? : No Does patient have P4CC services?: No  Past Medical History:  Past Medical History:  Diagnosis Date  . Anxiety   . Asthma   . Bipolar 1 disorder (HCC)   . Chronic hepatitis C without hepatic coma (HCC) 05/28/2018  . Depression   . Hepatitis C   . Hepatitis C antibody positive in blood 05/28/2018  . Schizophrenia (HCC)   . Sleep apnea     Past Surgical History:  Procedure Laterality Date  . CYST REMOVAL NECK     neck   Family History:  Family History  Problem Relation Age of Onset  . Hypertension Sister   . Schizophrenia Sister    Family Psychiatric  History: None Social History:  Social History   Substance and Sexual Activity  Alcohol Use Yes  . Alcohol/week: 3.0 standard drinks  . Types: 3 Shots of liquor per week     Social History   Substance and Sexual Activity  Drug Use Yes  . Types: Cocaine, Marijuana   Comment: last used cocaine Thursday and MJ use daily    Social History   Socioeconomic History  . Marital status: Single    Spouse name: Not on file  . Number of children: Not on file  . Years of education: Not on file  . Highest education level: Not on file  Occupational History  . Not on file  Social Needs  . Financial resource strain: Not very hard  . Food insecurity    Worry: Never true    Inability: Never true  . Transportation needs    Medical: No    Non-medical: No  Tobacco Use  . Smoking status: Current Some Day Smoker    Packs/day: 1.00    Types: Cigarettes  . Smokeless tobacco: Never Used  Substance and Sexual Activity  . Alcohol use: Yes    Alcohol/week: 3.0 standard drinks    Types: 3 Shots of liquor per week  . Drug use: Yes    Types: Cocaine, Marijuana    Comment: last used cocaine Thursday and MJ use daily  . Sexual activity: Not Currently  Lifestyle  . Physical activity    Days per week: 0 days    Minutes per session: 0 min  . Stress: Only a little  Relationships  . Social  Musicianconnections    Talks on phone: Twice a week    Gets together: Twice a week    Attends religious service: Never    Active member of club or organization: No    Attends meetings of clubs or organizations: Never    Relationship status: Patient refused  Other Topics Concern  . Not on file  Social History Narrative  . Not on file   Additional Social History:    Allergies:  No Known Allergies  Labs:  Results for orders placed or performed during the hospital encounter of 07/14/19 (from the past 48 hour(s))  Comprehensive metabolic panel     Status: Abnormal   Collection Time: 07/14/19 10:37 PM  Result Value Ref Range   Sodium 138 135 - 145 mmol/L   Potassium 3.5 3.5 - 5.1 mmol/L   Chloride 107 98 - 111 mmol/L   CO2 23 22 - 32 mmol/L   Glucose, Bld 112 (H) 70 - 99 mg/dL   BUN 15 6 - 20 mg/dL   Creatinine,  Ser 0.67 0.61 - 1.24 mg/dL   Calcium 9.2 8.9 - 16.110.3 mg/dL   Total Protein 8.2 (H) 6.5 - 8.1 g/dL   Albumin 4.3 3.5 - 5.0 g/dL   AST 46 (H) 15 - 41 U/L   ALT 45 (H) 0 - 44 U/L   Alkaline Phosphatase 40 38 - 126 U/L   Total Bilirubin 0.9 0.3 - 1.2 mg/dL   GFR calc non Af Amer >60 >60 mL/min   GFR calc Af Amer >60 >60 mL/min   Anion gap 8 5 - 15    Comment: Performed at [facility], [address] [place], [place] [address]  Ethanol     Status: None   Collection Time: 07/14/19 10:37 PM  Result Value Ref Range   Alcohol, Ethyl (B) <10 <10 mg/dL    Comment: (NOTE) Lowest detectable limit for serum alcohol is 10 mg/dL. For medical purposes only. Performed at [facility], [address] [place], [place] [address]   Salicylate level     Status: None   Collection Time: 07/14/19 10:37 PM  Result Value Ref Range   Salicylate Lvl <7.0 2.8 - 30.0 mg/dL    Comment: Performed at [facility], [address] [place], [place] [address]  Acetaminophen level     Status: Abnormal   Collection Time: 07/14/19 10:37 PM  Result Value Ref Range    Acetaminophen (Tylenol), Serum <10 (L) 10 - 30 ug/mL    Comment: (NOTE) Therapeutic concentrations vary significantly. A range of 10-30 ug/mL  may be an effective concentration for many patients. However, some  are best treated at concentrations outside of this range. Acetaminophen concentrations >150 ug/mL at 4 hours after ingestion  and >50 ug/mL at 12 hours after ingestion are often associated with  toxic reactions. Performed at [facility], [address] [place], [place] [address]   cbc     Status: Abnormal   Collection Time: 07/14/19 10:37 PM  Result Value Ref Range   WBC 11.2 (H) 4.0 - 10.5 K/uL   RBC 5.15 4.22 - 5.81 MIL/uL   Hemoglobin 15.2 13.0 - 17.0 g/dL   HCT 62.142.2 30.839.0 - 65.752.0 %   MCV 81.9 80.0 - 100.0 fL   MCH 29.5 26.0 - 34.0 pg   MCHC 36.0 30.0 - 36.0 g/dL   RDW 84.614.2 96.211.5 - 95.215.5 %   Platelets 194 150 - 400 K/uL   nRBC 0.0 0.0 - 0.2 %    Comment: Performed at [facility], [address] [place], [place] [address]    No current facility-administered medications for this encounter.    Current Outpatient Medications  Medication Sig Dispense Refill  . albuterol (VENTOLIN HFA) 108 (90 Base) MCG/ACT inhaler Inhale 1-2 puffs into the lungs every 6 (six) hours as needed for wheezing or shortness of breath. 6.7 g 1  . ARIPiprazole ER (ABILIFY MAINTENA) 400 MG SRER injection Inject 2 mLs (400 mg total) into the muscle every 28 (twenty-eight) days. 1 each 1  . cephALEXin (KEFLEX) 500 MG capsule Take 1 capsule (500 mg total) by mouth 3 (three) times daily. (Patient not taking: Reported on 07/15/2019) 21 capsule 0  . traZODone (DESYREL) 100 MG tablet Take 1 tablet (100 mg total) by mouth at bedtime as needed for sleep. 30 tablet 0    Musculoskeletal: Strength & Muscle Tone: within normal limits Gait & Station: normal Patient leans: Right and N/A  Psychiatric Specialty Exam: Physical Exam  Nursing note and vitals  reviewed. Constitutional: He is  oriented to person, place, and time. He appears well-developed and well-nourished.  Eyes: Pupils are equal, round, and reactive to light.  Neck: Normal range of motion. Neck supple.  Cardiovascular: Normal rate.  Respiratory: Effort normal.  Musculoskeletal: Normal range of motion.  Neurological: He is alert and oriented to person, place, and time.  Skin: Skin is warm and dry.    Review of Systems  Psychiatric/Behavioral: Positive for depression, hallucinations, substance abuse and suicidal ideas. The patient is nervous/anxious.   All other systems reviewed and are negative.   Blood pressure 139/78, pulse 89, temperature 98.8 F (37.1 C), temperature source Oral, resp. rate 16, height 5\' 11"  (1.803 m), weight 103 kg, SpO2 97 %.Body mass index is 31.66 kg/m.  General Appearance: Fairly Groomed  Eye Contact:  Fair  Speech:  Clear and Coherent  Volume:  Normal  Mood:  Anxious and Depressed  Affect:  Constricted, Depressed and Inappropriate  Thought Process:  Coherent  Orientation:  Full (Time, Place, and Person)  Thought Content:  Obsessions, Paranoid Ideation and Rumination  Suicidal Thoughts:  No  Homicidal Thoughts:  No  Memory:  Immediate;   Fair Recent;   Fair  Judgement:  Poor  Insight:  Lacking  Psychomotor Activity:  Normal  Concentration:  Concentration: Fair and Attention Span: Fair  Recall:  Poor  Fund of Knowledge:  Fair  Language:  Fair  Akathisia:  Negative  Handed:  Right  AIMS (if indicated):     Assets:  Desire for Improvement Social Support  ADL's:  Intact  Cognition:  Impaired,  Mild  Sleep:   Insomnia     Treatment Plan Summary: Medication management and Plan Patient does meet criteria for psychiatric inpatient admission for stabilization and treatment.  Disposition: Recommend psychiatric Inpatient admission when medically cleared. Supportive therapy provided about ongoing stressors.  Caroline Sauger, NP 07/15/2019 2:01 AM

## 2019-07-15 NOTE — Progress Notes (Signed)
Admission Note:  43 yr male who presents Voluntary in no acute distress for the treatment of SI, Depression and Suicidal ideation. Patient upon arrival to unit appears flat and sad but he brightens upon approach, affect is flat but he brightens upon approach, he is receptive to staff, calm and cooperative with admission process, he denies SI/HI/AVH and contracts for safety upon admission. Pt explained and he stated " I am off my medicine and I need help to get back on track"   Patient has Past medical Hx of Bipolar, Depression, Anxiety , schizophrenia. Patient's skin was assessed  In presence of Alex RN  It was found to be warm, dry and intact no abnormal marks noted, he was searched and no contraband found, Plan of care,  unit policies explained and understanding verbalized. Consents was obtained. Patient was oriented to unit no distress noted .15 minutes safety checks maintained will continue to monitor

## 2019-07-15 NOTE — Tx Team (Addendum)
Interdisciplinary Treatment and Diagnostic Plan Update  07/15/2019 Time of Session: 9am Dominic [name] MRN: [id number]  Principal Diagnosis: <principal problem not specified>  Secondary Diagnoses: Active Problems:   MDD (major depressive disorder), severe (HCC)   Current Medications:  Current Facility-Administered Medications  Medication Dose Route Frequency Provider Last Rate Last Dose  . acetaminophen (TYLENOL) tablet 650 mg  650 mg Oral Q6H PRN Caroline Sauger, NP      . albuterol (VENTOLIN HFA) 108 (90 Base) MCG/ACT inhaler 1-2 puff  1-2 puff Inhalation Q6H PRN Caroline Sauger, NP      . alum & mag hydroxide-simeth (MAALOX/MYLANTA) 200-200-20 MG/5ML suspension 30 mL  30 mL Oral Q4H PRN Caroline Sauger, NP      . magnesium hydroxide (MILK OF MAGNESIA) suspension 30 mL  30 mL Oral Daily PRN Caroline Sauger, NP      . traZODone (DESYREL) tablet 100 mg  100 mg Oral QHS PRN Caroline Sauger, NP       PTA Medications: Medications Prior to Admission  Medication Sig Dispense Refill Last Dose  . albuterol (VENTOLIN HFA) 108 (90 Base) MCG/ACT inhaler Inhale 1-2 puffs into the lungs every 6 (six) hours as needed for wheezing or shortness of breath. 6.7 g 1   . ARIPiprazole ER (ABILIFY MAINTENA) 400 MG SRER injection Inject 2 mLs (400 mg total) into the muscle every 28 (twenty-eight) days. 1 each 1   . cephALEXin (KEFLEX) 500 MG capsule Take 1 capsule (500 mg total) by mouth 3 (three) times daily. (Patient not taking: Reported on 07/15/2019) 21 capsule 0   . traZODone (DESYREL) 100 MG tablet Take 1 tablet (100 mg total) by mouth at bedtime as needed for sleep. 30 tablet 0     Patient Stressors: Educational concerns Financial difficulties Medication change or noncompliance Occupational concerns  Patient Strengths: Ability for insight Average or above average intelligence General fund of knowledge Motivation for treatment/growth  Treatment Modalities: Medication  Management, Group therapy, Case management,  1 to 1 session with clinician, Psychoeducation, Recreational therapy.   Physician Treatment Plan for Primary Diagnosis: <principal problem not specified> Long Term Goal(s):     Short Term Goals:    Medication Management: Evaluate patient's response, side effects, and tolerance of medication regimen.  Therapeutic Interventions: 1 to 1 sessions, Unit Group sessions and Medication administration.  Evaluation of Outcomes: Not Met  Physician Treatment Plan for Secondary Diagnosis: Active Problems:   MDD (major depressive disorder), severe (Dominic [name])  Long Term Goal(s):     Short Term Goals:       Medication Management: Evaluate patient's response, side effects, and tolerance of medication regimen.  Therapeutic Interventions: 1 to 1 sessions, Unit Group sessions and Medication administration.  Evaluation of Outcomes: Not Met   RN Treatment Plan for Primary Diagnosis: <principal problem not specified> Long Term Goal(s): Knowledge of disease and therapeutic regimen to maintain health will improve  Short Term Goals: Ability to identify and develop effective coping behaviors will improve and Compliance with prescribed medications will improve  Medication Management: RN will administer medications as ordered by provider, will assess and evaluate patient's response and provide education to patient for prescribed medication. RN will report any adverse and/or side effects to prescribing provider.  Therapeutic Interventions: 1 on 1 counseling sessions, Psychoeducation, Medication administration, Evaluate responses to treatment, Monitor vital signs and CBGs as ordered, Perform/monitor CIWA, COWS, AIMS and Fall Risk screenings as ordered, Perform wound care treatments as ordered.  Evaluation of Outcomes: Not Met   LCSW  Treatment Plan for Primary Diagnosis: <principal problem not specified> Long Term Goal(s): Safe transition to appropriate next level of  care at discharge, Engage patient in therapeutic group addressing interpersonal concerns.  Short Term Goals: Engage patient in aftercare planning with referrals and resources  Therapeutic Interventions: Assess for all discharge needs, 1 to 1 time with Social worker, Explore available resources and support systems, Assess for adequacy in community support network, Educate family and significant other(s) on suicide prevention, Complete Psychosocial Assessment, Interpersonal group therapy.  Evaluation of Outcomes: Not Met   Progress in Treatment: Attending groups: No. Participating in groups: No. Taking medication as prescribed: Yes. Toleration medication: Yes. Family/Significant other contact made: Yes, individual(s) contacted:  with pt; declined family contact Patient understands diagnosis: Yes. Discussing patient identified problems/goals with staff: Yes. Medical problems stabilized or resolved: No. Denies suicidal/homicidal ideation: Yes. Issues/concerns per patient self-inventory: No. Other: NA  New problem(s) identified: No, Describe:  none reported  New Short Term/Long Term Goal(s): Pt did not attend treatment team meeting due to having a migraine headache  Patient Goals: CSW met with pt and he states goal is "leave drugs and alcohol alone"   Discharge Plan or Barriers: Pt will return home and follow up at [facility]  Reason for Continuation of Hospitalization: Hallucinations Medication stabilization  Estimated Length of Stay:3-5 days  Recreational Therapy: Patient Stressors: N/A Patient Goal: Patient will engage in groups without prompting or encouragement from LRT x3 group sessions within 5 recreation therapy group sessions   Attendees: Patient:Dominic [name] [facility] 07/15/2019 11:12 AM  Physician: Alethia Berthold 07/15/2019 11:12 AM  Nursing:  07/15/2019 11:12 AM  RN Care Manager: 07/15/2019 11:12 AM  Social Worker: Sanjuana Kava 07/15/2019 11:12 AM  Recreational Therapist:  07/15/2019  11:12 AM  Other:  07/15/2019 11:12 AM  Other:  07/15/2019 11:12 AM  Other: 07/15/2019 11:12 AM    Scribe for Treatment Team: Yvette Rack, LCSW 07/15/2019 11:12 AM
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
## 2019-07-16 MED ORDER — ARIPIPRAZOLE ER 400 MG IM SRER: 400.0000 mg | INTRAMUSCULAR | 1 refills | Status: DC

## 2019-07-16 MED ORDER — TRAZODONE HCL 100 MG PO TABS: 100.0000 mg | ORAL_TABLET | Freq: Every evening | ORAL | 0 refills | Status: DC | PRN

## 2019-07-16 MED ORDER — ALBUTEROL SULFATE HFA 108 (90 BASE) MCG/ACT IN AERS: 1.0000 | INHALATION_SPRAY | Freq: Four times a day (QID) | RESPIRATORY_TRACT | 1 refills | Status: DC | PRN

## 2019-07-16 NOTE — Plan of Care (Signed)
  Problem: Group Participation Goal: STG - Patient will engage in groups without prompting or encouragement from LRT x3 group sessions within 5 recreation therapy group sessions Description: STG - Patient will engage in groups without prompting or encouragement from LRT x3 group sessions within 5 recreation therapy group sessions 07/16/2019 1321 by Ernest Haber, LRT Outcome: Not Applicable 01/14/3544 [id number] by Ernest Haber, LRT Outcome: Not Met (add Reason) Note: Patient did not attend any groups.

## 2019-07-16 NOTE — Progress Notes (Signed)
Recreation Therapy Notes  Date: 07/16/2019  Time: 9:30 am   Location: Craft room   Behavioral response: N/A   Intervention Topic: Goals  Discussion/Intervention: Patient did not attend group.   Clinical Observations/Feedback:  Patient did not attend group.   Mylasia Vorhees LRT/CTRS        Lawrence Roldan 07/16/2019 10:56 AM

## 2019-07-16 NOTE — Plan of Care (Signed)
Patient compliant with medication administration per MD orders and procedures on the unit.   Problem: Education: Goal: Knowledge of the prescribed therapeutic regimen will improve Outcome: Progressing   Problem: Health Behavior/Discharge Planning: Goal: Compliance with therapeutic regimen will improve Outcome: Progressing

## 2019-07-16 NOTE — Progress Notes (Signed)
  [facility] Adult Case Management Discharge Plan :  Will you be returning to the same living situation after discharge:  Yes,  lives with relative At discharge, do you have transportation home?: Yes,  taxi voucher Do you have the ability to pay for your medications: Yes,  insurance  Release of information consent forms completed and in the chart;  Patient's signature needed at discharge.  Patient to Follow up at: Follow-up Information    [facility] on 07/18/2019.   Why: Your CST team leader(Monica) will meet with you on Friday, September 4 at 2pm. Thank you. Contact information: [place] [id number] [phone]           Next level of care provider has access to [place] and Suicide Prevention discussed: Yes,  with pt; declined family contact  Have you used any form of tobacco in the last 30 days? (Cigarettes, Smokeless Tobacco, Cigars, and/or Pipes): Yes  Has patient been referred to the Quitline?: N/A patient is not a smoker  Patient has been referred for addiction treatment: N/A  Yvette Rack, LCSW 07/16/2019, 11:38 AM

## 2019-07-16 NOTE — BHH Suicide Risk Assessment (Signed)
[facility] Discharge Suicide Risk Assessment   Principal Problem: Schizophrenia, chronic condition ([place]) Discharge Diagnoses: Principal Problem:   Schizophrenia, chronic condition ([place]) Active Problems:   Cocaine abuse ([place])   Total Time spent with patient: 30 minutes  Musculoskeletal: Strength & Muscle Tone: within normal limits Gait & Station: normal Patient leans: N/A  Psychiatric Specialty Exam: Review of Systems  Constitutional: Negative.   HENT: Negative.   Eyes: Negative.   Respiratory: Negative.   Cardiovascular: Negative.   Gastrointestinal: Negative.   Musculoskeletal: Negative.   Skin: Negative.   Neurological: Negative.   Psychiatric/Behavioral: Negative.     Blood pressure 106/72, pulse (!) 53, temperature 97.8 F (36.6 C), temperature source Oral, resp. rate 16, height 5\' 10"  (1.778 m), weight 103 kg, SpO2 98 %.Body mass index is 32.57 kg/m.  General Appearance: Casual  Eye Contact::  Good  Speech:  Normal Rate409  Volume:  Normal  Mood:  Euthymic  Affect:  Congruent  Thought Process:  Goal Directed  Orientation:  Negative  Thought Content:  Logical  Suicidal Thoughts:  No  Homicidal Thoughts:  No  Memory:  Immediate;   Fair Recent;   Fair Remote;   Fair  Judgement:  Fair  Insight:  Fair  Psychomotor Activity:  Normal  Concentration:  Fair  Recall:  [employer] of Knowledge:Fair  Language: Fair  Akathisia:  No  Handed:  Right  AIMS (if indicated):     Assets:  Desire for Improvement  Sleep:  Number of Hours: 0.75  Cognition: WNL  ADL's:  Intact   Mental Status Per Nursing Assessment::   On Admission:  NA  Demographic Factors:  Male and Living alone  Loss Factors: Loss of significant relationship  Historical Factors: Impulsivity  Risk Reduction Factors:   Positive social support and Positive therapeutic relationship  Continued Clinical Symptoms:  Alcohol/Substance Abuse/Dependencies Schizophrenia:   Paranoid or undifferentiated  type  Cognitive Features That Contribute To Risk:  None    Suicide Risk:  Minimal: No identifiable suicidal ideation.  Patients presenting with no risk factors but with morbid ruminations; may be classified as minimal risk based on the severity of the depressive symptoms  Follow-up Information    [facility] on 07/18/2019.   Why: Your CST team leader(Monica) will meet with you on Friday, September 4 at 2pm. Thank you. Contact information: [place] [address] [phone]           Plan Of Care/Follow-up recommendations:  Activity:  Activity as tolerated Diet:  Regular diet Other:  Follow-up with your act team with [facility].  Stay on Abilify and get your shot when it is due towards the end of the month at the latest.  Please do not use cocaine.  Alethia Berthold, MD 07/16/2019, 11:22 AM

## 2019-07-16 NOTE — Progress Notes (Signed)
D: Patient is aware of  Discharge this shift .  A:Patient denies suicidal /homicidal ideations. Patient received all belongings brought in   No Storage medications. Writer reviewed Discharge Summary, Suicide Risk Assessment, and Transitional Record. Patient also received Prescriptions   from  MD.  . Aware  Of follow up appointment .  R: Patient left unit with no questions  Or concerns  By taxi

## 2019-07-16 NOTE — Progress Notes (Signed)
Recreation Therapy Notes  INPATIENT RECREATION TR PLAN  Patient Details Name: Omri Bertran MRN: [id number] DOB: [birth year] Today's Date: 07/16/2019  Rec Therapy Plan Is patient appropriate for Therapeutic Recreation?: Yes Treatment times per week: at least 3 Estimated Length of Stay: 5-7 days TR Treatment/Interventions: Group participation (Comment)  Discharge Criteria Pt will be discharged from therapy if:: Discharged Treatment plan/goals/alternatives discussed and agreed upon by:: Patient/family  Discharge Summary Short term goals set: Patient will engage in groups without prompting or encouragement from LRT x3 group sessions within 5 recreation therapy group sessions Short term goals met: Not met Reason goals not met: Patient did not attend any groups Therapeutic equipment acquired: N/A Reason patient discharged from therapy: Discharge from hospital Pt/family agrees with progress & goals achieved: Yes Date patient discharged from therapy: 07/16/19   Niamh Rada 07/16/2019, 1:23 PM

## 2019-07-16 NOTE — Plan of Care (Signed)
  Problem: Education: Goal: Utilization of techniques to improve thought processes will improve Outcome: Adequate for Discharge Goal: Knowledge of the prescribed therapeutic regimen will improve Outcome: Adequate for Discharge   Problem: Activity: Goal: Interest or engagement in leisure activities will improve Outcome: Adequate for Discharge Goal: Imbalance in normal sleep/wake cycle will improve Outcome: Adequate for Discharge   Problem: Coping: Goal: Coping ability will improve Outcome: Adequate for Discharge Goal: Will verbalize feelings Outcome: Adequate for Discharge   Problem: Health Behavior/Discharge Planning: Goal: Ability to make decisions will improve Outcome: Adequate for Discharge Goal: Compliance with therapeutic regimen will improve Outcome: Adequate for Discharge   Problem: Role Relationship: Goal: Will demonstrate positive changes in social behaviors and relationships Outcome: Adequate for Discharge   Problem: Safety: Goal: Ability to disclose and discuss suicidal ideas will improve Outcome: Adequate for Discharge Goal: Ability to identify and utilize support systems that promote safety will improve Outcome: Adequate for Discharge   Problem: Self-Concept: Goal: Will verbalize positive feelings about self Outcome: Adequate for Discharge Goal: Level of anxiety will decrease Outcome: Adequate for Discharge   Problem: Education: Goal: Ability to state activities that reduce stress will improve Outcome: Adequate for Discharge   Problem: Coping: Goal: Ability to identify and develop effective coping behavior will improve Outcome: Adequate for Discharge   Problem: Self-Concept: Goal: Ability to identify factors that promote anxiety will improve Outcome: Adequate for Discharge Goal: Level of anxiety will decrease Outcome: Adequate for Discharge Goal: Ability to modify response to factors that promote anxiety will improve Outcome: Adequate for Discharge   Problem: Education: Goal: Ability to identify and develop effective coping behavior will improve Outcome: Adequate for Discharge   Problem: Coping: Goal: Communication of feelings regarding changes in body function or appearance will improve Outcome: Adequate for Discharge   Problem: Health Behavior/Discharge Planning: Goal: Identification of resources available to assist in meeting health care needs will improve Outcome: Adequate for Discharge   Problem: Self-Concept: Goal: Expressions of positive opinion of body image will increase Outcome: Adequate for Discharge Goal: Will verbalize positive feelings about self Outcome: Adequate for Discharge   Problem: Education: Goal: Knowledge of disease or condition will improve Outcome: Adequate for Discharge Goal: Understanding of discharge needs will improve Outcome: Adequate for Discharge   Problem: Health Behavior/Discharge Planning: Goal: Ability to identify changes in lifestyle to reduce recurrence of condition will improve Outcome: Adequate for Discharge Goal: Identification of resources available to assist in meeting health care needs will improve Outcome: Adequate for Discharge   Problem: Safety: Goal: Ability to remain free from injury will improve Outcome: Adequate for Discharge

## 2019-07-16 NOTE — Discharge Summary (Signed)
Physician Discharge Summary Note  Patient:  Dominic [name] is an 43 y.o., male MRN:  [id number] DOB:  January 18, 1976 Patient phone:  [phone] (home)  Patient address:   [address] [place] [address],  Total Time spent with patient: 45 minutes  Date of Admission:  07/15/2019 Date of Discharge: July 16, 2019  Reason for Admission: Admitted through the emergency room where he presented with agitation hallucinations hopelessness vague suicidal ideation without any act on it.  Had been off of his medicine and using cocaine again.  Principal Problem: Schizophrenia, chronic condition [facility]) Discharge Diagnoses: Principal Problem:   Schizophrenia, chronic condition ([name]) Active Problems:   Cocaine abuse ([place])   Past Psychiatric History: Patient has a history of schizoaffective disorder or schizophrenia and also substance abuse with cocaine being the most common problem.  He is seen by [facility] and does well when he is on his Abilify maintain but when he starts using cocaine his psychotic symptoms and his mood instability will come back full strength.  Frequent visits to the hospital usually for only a short period of time.  Past Medical History:  Past Medical History:  Diagnosis Date  . Anxiety   . Asthma   . Bipolar 1 disorder ([place])   . Chronic hepatitis C without hepatic coma ([place]) 05/28/2018  . Depression   . Hepatitis C   . Hepatitis C antibody positive in blood 05/28/2018  . Schizophrenia ([place])   . Sleep apnea     Past Surgical History:  Procedure Laterality Date  . CYST REMOVAL NECK     neck   Family History:  Family History  Problem Relation Age of Onset  . Hypertension Sister   . Schizophrenia Sister    Family Psychiatric  History: None reported Social History:  Social History   Substance and Sexual Activity  Alcohol Use Yes  . Alcohol/week: 3.0 standard drinks  . Types: 3 Shots of liquor per week     Social History   Substance and Sexual Activity  Drug  Use Yes  . Types: Cocaine, Marijuana   Comment: last used cocaine Thursday and MJ use daily    Social History   Socioeconomic History  . Marital status: Single    Spouse name: Not on file  . Number of children: Not on file  . Years of education: Not on file  . Highest education level: Not on file  Occupational History  . Not on file  Social Needs  . Financial resource strain: Not very hard  . Food insecurity    Worry: Never true    Inability: Never true  . Transportation needs    Medical: No    Non-medical: No  Tobacco Use  . Smoking status: Current Some Day Smoker    Packs/day: 1.00    Types: Cigarettes  . Smokeless tobacco: Never Used  Substance and Sexual Activity  . Alcohol use: Yes    Alcohol/week: 3.0 standard drinks    Types: 3 Shots of liquor per week  . Drug use: Yes    Types: Cocaine, Marijuana    Comment: last used cocaine Thursday and MJ use daily  . Sexual activity: Not Currently  Lifestyle  . Physical activity    Days per week: 0 days    Minutes per session: 0 min  . Stress: Only a little  Relationships  . Social connections    Talks on phone: Twice a week    Gets together: Twice a week    Attends  religious service: Never    Active member of club or organization: No    Attends meetings of clubs or organizations: Never    Relationship status: Patient refused  Other Topics Concern  . Not on file  Social History Narrative  . Not on file    Hospital Course: Patient admitted to the psychiatric ward.  15-minute checks employed.  Patient did not display any dangerous aggressive or suicidal behavior on the unit.  He was cooperative with assessment and treatment.  He received his 400 mg long-acting Abilify shot yesterday.  This morning the patient tells me he is feeling much better.  Denies any hallucinations.  Does not look like he is responding to internal stimuli.  He is lucid and clear in his thinking.  He acknowledges that cocaine abuse continues to be  a major problem and promises to once again make an effort to engage with his community support team and resources to help him stay off drugs and stay on his medicine.  Patient no longer meets commitment criteria.  Appears to be at his baseline.  He is requesting discharge and will be discharged back home with follow-up as usual.  Physical Findings: AIMS: Facial and Oral Movements Muscles of Facial Expression: None, normal Lips and Perioral Area: None, normal Jaw: None, normal Tongue: None, normal,Extremity Movements Upper (arms, wrists, hands, fingers): None, normal Lower (legs, knees, ankles, toes): None, normal, Trunk Movements Neck, shoulders, hips: None, normal, Overall Severity Severity of abnormal movements (highest score from questions above): None, normal Incapacitation due to abnormal movements: None, normal Patient's awareness of abnormal movements (rate only patient's report): No Awareness, Dental Status Current problems with teeth and/or dentures?: No Does patient usually wear dentures?: No  CIWA:  CIWA-Ar Total: 2 COWS:  COWS Total Score: 0  Musculoskeletal: Strength & Muscle Tone: within normal limits Gait & Station: normal Patient leans: N/A  Psychiatric Specialty Exam: Physical Exam  Nursing note and vitals reviewed. Constitutional: He appears well-developed and well-nourished.  HENT:  Head: Normocephalic and atraumatic.  Eyes: Pupils are equal, round, and reactive to light. Conjunctivae are normal.  Neck: Normal range of motion.  Cardiovascular: Regular rhythm and normal heart sounds.  Respiratory: Effort normal.  GI: Soft.  Musculoskeletal: Normal range of motion.  Neurological: He is alert.  Skin: Skin is warm and dry.  Psychiatric: He has a normal mood and affect. His speech is normal and behavior is normal. Judgment and thought content normal. Cognition and memory are normal.    Review of Systems  Constitutional: Negative.   HENT: Negative.   Eyes:  Negative.   Respiratory: Negative.   Cardiovascular: Negative.   Gastrointestinal: Negative.   Musculoskeletal: Negative.   Skin: Negative.   Neurological: Negative.   Psychiatric/Behavioral: Negative.     Blood pressure 106/72, pulse (!) 53, temperature 97.8 F (36.6 C), temperature source Oral, resp. rate 16, height 5\' 10"  (1.778 m), weight 103 kg, SpO2 98 %.Body mass index is 32.57 kg/m.  General Appearance: Casual  Eye Contact:  Good  Speech:  Clear and Coherent  Volume:  Normal  Mood:  Euthymic  Affect:  Congruent  Thought Process:  Goal Directed  Orientation:  Full (Time, Place, and Person)  Thought Content:  Logical  Suicidal Thoughts:  No  Homicidal Thoughts:  No  Memory:  Immediate;   Fair Recent;   Fair Remote;   Fair  Judgement:  Fair  Insight:  Fair  Psychomotor Activity:  Normal  Concentration:  Concentration: Fair  Recall:  Jennelle HumanFair  Fund of Knowledge:  Fair  Language:  Fair  Akathisia:  No  Handed:  Right  AIMS (if indicated):     Assets:  Desire for Improvement Housing Physical Health  ADL's:  Intact  Cognition:  WNL  Sleep:  Number of Hours: 0.75     Have you used any form of tobacco in the last 30 days? (Cigarettes, Smokeless Tobacco, Cigars, and/or Pipes): Yes  Has this patient used any form of tobacco in the last 30 days? (Cigarettes, Smokeless Tobacco, Cigars, and/or Pipes) Yes, Yes, A prescription for an FDA-approved tobacco cessation medication was offered at discharge and the patient refused  Blood Alcohol level:  Lab Results  Component Value Date   [facility] 07/14/2019   ETH <10 06/16/2019    Metabolic Disorder Labs:  Lab Results  Component Value Date   HGBA1C 5.3 04/08/2018   MPG 105.41 04/08/2018   No results found for: PROLACTIN Lab Results  Component Value Date   CHOL 150 04/08/2018   TRIG 126 04/08/2018   HDL 44 04/08/2018   CHOLHDL 3.4 04/08/2018   VLDL 25 04/08/2018   LDLCALC 81 04/08/2018    See Psychiatric Specialty  Exam and Suicide Risk Assessment completed by Attending Physician prior to discharge.  Discharge destination:  Home  Is patient on multiple antipsychotic therapies at discharge:  No   Has Patient had three or more failed trials of antipsychotic monotherapy by history:  No  Recommended Plan for Multiple Antipsychotic Therapies: NA  Discharge Instructions    Diet - low sodium heart healthy   Complete by: As directed    Increase activity slowly   Complete by: As directed      Allergies as of 07/16/2019   No Known Allergies     Medication List    STOP taking these medications   cephALEXin 500 MG capsule Commonly known as: Keflex     TAKE these medications     Indication  albuterol 108 (90 Base) MCG/ACT inhaler Commonly known as: VENTOLIN HFA Inhale 1-2 puffs into the lungs every 6 (six) hours as needed for wheezing or shortness of breath.  Indication: Asthma   ARIPiprazole ER 400 MG Srer injection Commonly known as: ABILIFY MAINTENA Inject 2 mLs (400 mg total) into the muscle every 28 (twenty-eight) days. Start taking on: August 12, 2019  Indication: Schizophrenia   traZODone 100 MG tablet Commonly known as: DESYREL Take 1 tablet (100 mg total) by mouth at bedtime as needed for sleep.  Indication: Trouble Sleeping      Follow-up Information    [facility] Go on 07/18/2019.   Why: Your CST team leader(Monica) will meet with you on Friday, September 4 at 2pm. Thank you. Contact information: [address] [place] [id number] [phone]           Follow-up recommendations:  Activity:  Activity as tolerated Diet:  Regular diet Other:  Follow-up as usual with community support team  Comments: Next injection of Abilify is due on the 29th.  Prescription provided.  Also provided prescription for as needed trazodone for sleep.  Also for his albuterol inhaler.  Signed: Mordecai RasmussenJohn Clapacs, MD 07/16/2019, 11:27 AM

## 2019-07-16 NOTE — Progress Notes (Signed)
D - Patient was in his room upon arrival to the unit. Patient denies SI/HI/AVH, pain, anxiety and depression with this Probation officer. Patient stated to this writer, "I just came in to get my medications right, I should be going home tomorrow." Patient given education.   A - Patient compliant with medication administration per MD orders and procedures on the unit. Patient given education. Patient given support and encouragement to be active in his treatment plan. Patient informed to let staff know if there are any issues or problems on the unit.   R - Patient being monitored Q 15 minutes for safety per unit protocol. Patient remains safe on the unit.

## 2019-07-23 ENCOUNTER — Ambulatory Visit: Payer: Self-pay | Admitting: Licensed Clinical Social Worker

## 2019-07-23 ENCOUNTER — Telehealth: Payer: Self-pay

## 2019-07-23 NOTE — Chronic Care Management (AMB) (Signed)
  Care Management   Follow Up Note   07/23/2019 Name: Stokes Rattigan MRN: [id number] DOB: [birth year]  Referred by: Venita Lick, NP Reason for referral : Care Coordination   Naveen Charly Holcomb is a 43 y.o. year old male who is a primary care patient of Cannady, Barbaraann Faster, NP. The care management team was consulted for assistance with care management and care coordination needs.    Review of patient status, including review of consultants reports, relevant laboratory and other test results, and collaboration with appropriate care team members and the patient's provider was performed as part of comprehensive patient evaluation and provision of chronic care management services.    LCSW completed CCM outreach attempt today but was unable to reach patient successfully. A HIPPA compliant voice message was left encouraging patient to return call once available. LCSW rescheduled CCM SW appointment as well.  A HIPPA compliant phone message was left for the patient providing contact information and requesting a return call.   Eula Fried, BSW, MSW, [facility] Care Management [email or web address] Phone: [phone]

## 2019-07-25 ENCOUNTER — Encounter: Payer: Self-pay | Admitting: Nurse Practitioner

## 2019-09-01 ENCOUNTER — Other Ambulatory Visit: Payer: Self-pay | Admitting: Psychiatry

## 2019-09-01 MED ORDER — ARIPIPRAZOLE ER 400 MG IM SRER: 400.0000 mg | INTRAMUSCULAR | 1 refills | Status: DC

## 2019-09-01 MED ORDER — TRAZODONE HCL 100 MG PO TABS: 100.0000 mg | ORAL_TABLET | Freq: Every evening | ORAL | 0 refills | Status: DC | PRN

## 2019-09-10 ENCOUNTER — Ambulatory Visit: Payer: Self-pay | Admitting: Licensed Clinical Social Worker

## 2019-09-10 ENCOUNTER — Telehealth: Payer: Self-pay

## 2019-09-10 NOTE — Chronic Care Management (AMB) (Signed)
  Care Management   Follow Up Note   09/10/2019 Name: Dominic [name] MRN: [id number] DOB: [birth year]  Referred by: Venita Lick, NP Reason for referral : Care Coordination   Dominic [name] is a 43 y.o. year old male who is a primary care patient of Cannady, Barbaraann Faster, NP. The care management team was consulted for assistance with care management and care coordination needs.    Review of patient status, including review of consultants reports, relevant laboratory and other test results, and collaboration with appropriate care team members and the patient's provider was performed as part of comprehensive patient evaluation and provision of chronic care management services.    LCSW completed CCM outreach attempt today but was unable to reach patient successfully. A HIPPA compliant voice message was left encouraging patient to return call once available.  The care management team is available to follow up with the patient after provider conversation with the patient regarding recommendation for care management engagement and subsequent re-referral to the care management team.   Eula Fried, BSW, MSW, [email or web address] Phone: [phone]

## 2020-03-30 ENCOUNTER — Emergency Department
Admission: EM | Admit: 2020-03-30 | Discharge: 2020-04-01 | Disposition: A | Discharge status: Home / Self Care | Payer: No Typology Code available for payment source | Attending: Emergency Medicine | Admitting: Emergency Medicine

## 2020-03-30 ENCOUNTER — Other Ambulatory Visit: Payer: Self-pay

## 2020-03-30 DIAGNOSIS — F330 Major depressive disorder, recurrent, mild: Secondary | ICD-10-CM | POA: Insufficient documentation

## 2020-03-30 DIAGNOSIS — F17210 Nicotine dependence, cigarettes, uncomplicated: Secondary | ICD-10-CM | POA: Insufficient documentation

## 2020-03-30 DIAGNOSIS — F319 Bipolar disorder, unspecified: Secondary | ICD-10-CM | POA: Diagnosis not present

## 2020-03-30 DIAGNOSIS — F322 Major depressive disorder, single episode, severe without psychotic features: Secondary | ICD-10-CM | POA: Diagnosis present

## 2020-03-30 DIAGNOSIS — Z79899 Other long term (current) drug therapy: Secondary | ICD-10-CM | POA: Insufficient documentation

## 2020-03-30 DIAGNOSIS — F29 Unspecified psychosis not due to a substance or known physiological condition: Secondary | ICD-10-CM | POA: Diagnosis present

## 2020-03-30 DIAGNOSIS — Z20822 Contact with and (suspected) exposure to covid-19: Secondary | ICD-10-CM | POA: Diagnosis not present

## 2020-03-30 DIAGNOSIS — F1010 Alcohol abuse, uncomplicated: Secondary | ICD-10-CM | POA: Diagnosis present

## 2020-03-30 DIAGNOSIS — R451 Restlessness and agitation: Secondary | ICD-10-CM | POA: Diagnosis present

## 2020-03-30 DIAGNOSIS — F1410 Cocaine abuse, uncomplicated: Secondary | ICD-10-CM | POA: Diagnosis present

## 2020-03-30 DIAGNOSIS — J45909 Unspecified asthma, uncomplicated: Secondary | ICD-10-CM | POA: Diagnosis present

## 2020-03-30 DIAGNOSIS — F203 Undifferentiated schizophrenia: Secondary | ICD-10-CM | POA: Diagnosis present

## 2020-03-30 DIAGNOSIS — F209 Schizophrenia, unspecified: Secondary | ICD-10-CM | POA: Insufficient documentation

## 2020-03-30 DIAGNOSIS — F10920 Alcohol use, unspecified with intoxication, uncomplicated: Secondary | ICD-10-CM | POA: Diagnosis present

## 2020-03-30 DIAGNOSIS — B182 Chronic viral hepatitis C: Secondary | ICD-10-CM | POA: Diagnosis present

## 2020-03-30 DIAGNOSIS — F23 Brief psychotic disorder: Secondary | ICD-10-CM

## 2020-03-30 DIAGNOSIS — Z8659 Personal history of other mental and behavioral disorders: Secondary | ICD-10-CM

## 2020-03-30 LAB — CBC
HCT: 37.3 % — ABNORMAL LOW (ref 39.0–52.0)
Hemoglobin: 13.6 g/dL (ref 13.0–17.0)
MCH: 30 pg (ref 26.0–34.0)
MCHC: 36.5 g/dL — ABNORMAL HIGH (ref 30.0–36.0)
MCV: 82.2 fL (ref 80.0–100.0)
Platelets: 270 10*3/uL (ref 150–400)
RBC: 4.54 MIL/uL (ref 4.22–5.81)
RDW: 13.5 % (ref 11.5–15.5)
WBC: 11.5 10*3/uL — ABNORMAL HIGH (ref 4.0–10.5)
nRBC: 0 % (ref 0.0–0.2)

## 2020-03-30 LAB — URINE DRUG SCREEN, QUALITATIVE (ARMC ONLY)
Amphetamines, Ur Screen: NOT DETECTED
Barbiturates, Ur Screen: NOT DETECTED
Benzodiazepine, Ur Scrn: NOT DETECTED
Cannabinoid 50 Ng, Ur ~~LOC~~: POSITIVE — AB
Cocaine Metabolite,Ur ~~LOC~~: POSITIVE — AB
MDMA (Ecstasy)Ur Screen: NOT DETECTED
Methadone Scn, Ur: NOT DETECTED
Opiate, Ur Screen: NOT DETECTED
Phencyclidine (PCP) Ur S: NOT DETECTED
Tricyclic, Ur Screen: NOT DETECTED

## 2020-03-30 LAB — COMPREHENSIVE METABOLIC PANEL
ALT: 33 U/L (ref 0–44)
AST: 41 U/L (ref 15–41)
Albumin: 4.1 g/dL (ref 3.5–5.0)
Alkaline Phosphatase: 40 U/L (ref 38–126)
Anion gap: 6 (ref 5–15)
BUN: 17 mg/dL (ref 6–20)
CO2: 23 mmol/L (ref 22–32)
Calcium: 8.9 mg/dL (ref 8.9–10.3)
Chloride: 107 mmol/L (ref 98–111)
Creatinine, Ser: 0.82 mg/dL (ref 0.61–1.24)
GFR calc Af Amer: >60 mL/min (ref >60)
GFR calc non Af Amer: >60 mL/min (ref >60)
Glucose, Bld: 97 mg/dL (ref 70–99)
Potassium: 3.9 mmol/L (ref 3.5–5.1)
Sodium: 136 mmol/L (ref 135–145)
Total Bilirubin: 0.9 mg/dL (ref 0.3–1.2)
Total Protein: 7.9 g/dL (ref 6.5–8.1)

## 2020-03-30 LAB — ACETAMINOPHEN LEVEL: Acetaminophen (Tylenol), Serum: <10 ug/mL — ABNORMAL LOW (ref 10–30)

## 2020-03-30 LAB — SALICYLATE LEVEL: Salicylate Lvl: <7 mg/dL — ABNORMAL LOW (ref 7.0–30.0)

## 2020-03-30 LAB — ETHANOL: Alcohol, Ethyl (B): <10 mg/dL (ref <10)

## 2020-03-30 LAB — SARS CORONAVIRUS 2 BY RT PCR (HOSPITAL ORDER, PERFORMED IN ~~LOC~~ HOSPITAL LAB): SARS Coronavirus 2: NEGATIVE

## 2020-03-30 MED ORDER — ZIPRASIDONE MESYLATE 20 MG IM SOLR
20.0000 mg | Freq: Once | INTRAMUSCULAR | Status: AC
  Administered 2020-03-30: 20 mg via INTRAMUSCULAR
  Filled 2020-03-30: qty 20

## 2020-03-30 NOTE — ED Notes (Signed)
Pt given ginger ale and 2 warm blankets. No other needs voiced at this time.

## 2020-03-30 NOTE — ED Notes (Signed)
Pts Fiance, Mellody Drown, who took out IVC paper work called this Charity fundraiser to provide information. This RN did not provide information on pt. Mrs. Daye reports pt has been off of medication and self medicating with drugs and has been becoming increasingly more aggressive and dangerous to others in household. Per Melina Fiddler, pt was in his best mental health when he was on [place] injection.   [name] Daye works 3rd shift and is available from 8am-10pm for telephone conversations.

## 2020-03-30 NOTE — ED Provider Notes (Signed)
[facility] Emergency Department Provider Note  ____________________________________________  Time seen: Approximately 7:21 PM  I have reviewed the triage vital signs and the nursing notes.   HISTORY  Chief Complaint Behavior Problem  Level 5 Caveat: Portions of the History and Physical including HPI and review of systems are unable to be completely obtained due to patient being a poor historian    HPI Dominic [name] is a 44 y.o. male with a history of bipolar disorder, schizophrenia, anxiety, asthma who is brought to the ED under involuntary commitment due to agitation, making threats of violence against others, auditory hallucinations.  Patient denies any symptoms and states that he feels fine.  Girlfriend has reported that the patient does not take medications.      Past Medical History:  Diagnosis Date  . Anxiety   . Asthma   . Bipolar 1 disorder ([facility])   . Chronic hepatitis C without hepatic coma ([facility]) 05/28/2018  . Depression   . Hepatitis C   . Hepatitis C antibody positive in blood 05/28/2018  . Schizophrenia ([facility])   . Sleep apnea      Patient Active Problem List   Diagnosis Date Noted  . MDD (major depressive disorder), severe ([facility]) 07/15/2019  . History of command hallucinations   . Schizophrenia ([facility]) 05/22/2019  . Schizophrenia, chronic condition ([facility]) 01/14/2019  . Alcohol intoxication, uncomplicated ([facility]) 01/13/2019  . Asthma 11/25/2018  . Nicotine dependence, cigarettes, uncomplicated 11/25/2018  . Chronic hepatitis C without hepatic coma ([facility]) 05/28/2018  . Psychosis ([facility]) 02/16/2016  . Alcohol abuse 02/16/2016  . Cocaine abuse ([facility]) 02/16/2016     Past Surgical History:  Procedure Laterality Date  . CYST REMOVAL NECK     neck     Prior to Admission medications   Medication Sig Start Date End Date Taking? Authorizing Provider  albuterol (VENTOLIN HFA) 108 (90 Base) MCG/ACT inhaler Inhale 1-2 puffs into the lungs  every 6 (six) hours as needed for wheezing or shortness of breath. 07/16/19   Clapacs, Jackquline Denmark, MD  ARIPiprazole ER (ABILIFY MAINTENA) 400 MG SRER injection Inject 2 mLs (400 mg total) into the muscle every 28 (twenty-eight) days. 09/01/19   Clapacs, Jackquline Denmark, MD  traZODone (DESYREL) 100 MG tablet Take 1 tablet (100 mg total) by mouth at bedtime as needed for sleep. 09/01/19   Clapacs, Jackquline Denmark, MD     Allergies Patient has no known allergies.   Family History  Problem Relation Age of Onset  . Hypertension Sister   . Schizophrenia Sister     Social History Social History   Tobacco Use  . Smoking status: Current Some Day Smoker    Packs/day: 1.00    Types: Cigarettes  . Smokeless tobacco: Never Used  Substance Use Topics  . Alcohol use: Yes    Alcohol/week: 3.0 standard drinks    Types: 3 Shots of liquor per week  . Drug use: Yes    Types: Cocaine, Marijuana    Comment: last used cocaine Thursday and MJ use daily    Review of Systems  Constitutional:   No fever or chills.  ENT:   No sore throat. No rhinorrhea. Cardiovascular:   No chest pain or syncope. Respiratory:   No dyspnea or cough. Gastrointestinal:   Negative for abdominal pain, vomiting and diarrhea.  Musculoskeletal:   Negative for focal pain or swelling All other systems reviewed and are negative except as documented above in ROS and HPI.  ____________________________________________   PHYSICAL  EXAM:  VITAL SIGNS: ED Triage Vitals [03/30/20 1824]  Enc Vitals Group     BP 137/83     Pulse Rate 84     Resp (!) 24     Temp 98.4 F (36.9 C)     Temp Source Oral     SpO2 99 %     Weight 215 lb (97.5 kg)     Height 5\' 11"  (1.803 m)     Head Circumference      Peak Flow      Pain Score 0     Pain Loc      Pain Edu?      Excl. in GC?     Vital signs reviewed, nursing assessments reviewed.   Constitutional:   Alert and oriented. Non-toxic appearance. Eyes:   Conjunctivae are normal. EOMI.  PERRL. ENT      Head:   Normocephalic and atraumatic.      Nose:   Wearing a mask.      Mouth/Throat:   Wearing a mask.      Neck:   No meningismus. Full ROM. Hematological/Lymphatic/Immunilogical:   No cervical lymphadenopathy. Cardiovascular:   RRR. Symmetric bilateral radial and DP pulses.  No murmurs. Cap refill less than 2 seconds. Respiratory:   Normal respiratory effort without tachypnea/retractions. Breath sounds are clear and equal bilaterally. No wheezes/rales/rhonchi. Gastrointestinal:   Soft and nontender. Non distended. There is no CVA tenderness.  No rebound, rigidity, or guarding. Musculoskeletal:   Normal range of motion in all extremities. No joint effusions.  No lower extremity tenderness.  No edema. Neurologic:   Normal speech and language.  Motor grossly intact. No acute focal neurologic deficits are appreciated.  Skin:    Skin is warm, dry and intact. No rash noted.  No petechiae, purpura, or bullae.  ____________________________________________    LABS (pertinent positives/negatives) (all labs ordered are listed, but only abnormal results are displayed) Labs Reviewed  CBC - Abnormal; Notable for the following components:      Result Value   WBC 11.5 (*)    HCT 37.3 (*)    MCHC 36.5 (*)    All other components within normal limits  SARS CORONAVIRUS 2 BY RT PCR (HOSPITAL ORDER, PERFORMED IN [facility])  COMPREHENSIVE METABOLIC PANEL  ETHANOL  URINE DRUG SCREEN, QUALITATIVE ([facility] ONLY)  ACETAMINOPHEN LEVEL  SALICYLATE LEVEL   ____________________________________________   EKG    ____________________________________________    RADIOLOGY  No results found.  ____________________________________________   PROCEDURES Procedures  ____________________________________________    CLINICAL IMPRESSION / ASSESSMENT AND PLAN / ED COURSE  Medications ordered in the ED: Medications - No data to display  Pertinent labs & imaging  results that were available during my care of the patient were reviewed by me and considered in my medical decision making (see chart for details).  Dominic [name] was evaluated in Emergency Department on 03/30/2020 for the symptoms described in the history of present illness. He was evaluated in the context of the global COVID-19 pandemic, which necessitated consideration that the patient might be at risk for infection with the SARS-CoV-2 virus that causes COVID-19. Institutional protocols and algorithms that pertain to the evaluation of patients at risk for COVID-19 are in a state of rapid change based on information released by regulatory bodies including the CDC and federal and state organizations. These policies and algorithms were followed during the patient's care in the ED.   Patient brought to ED under involuntary commitment due  to agitation, auditory hallucinations, consistent with acute psychosis.  Will continue IVC, pursue psychiatry evaluation.  He is medically stable on arrival.  The patient has been placed in psychiatric observation due to the need to provide a safe environment for the patient while obtaining psychiatric consultation and evaluation, as well as ongoing medical and medication management to treat the patient's condition.  The patient has been placed under full IVC at this time.       ____________________________________________   FINAL CLINICAL IMPRESSION(S) / ED DIAGNOSES    Final diagnoses:  Acute psychosis [facility])     ED Discharge Orders    None      Portions of this note were generated with dragon dictation software. Dictation errors may occur despite best attempts at proofreading.   Carrie Mew, MD 03/30/20 [phone]

## 2020-03-30 NOTE — ED Notes (Signed)
Pt out in hallway, crying, yelling. Pt guided back into room by this RN. Pt upset, this RN unable to understand pt due to excessive word salad and inability to organize thoughts.

## 2020-03-30 NOTE — ED Notes (Signed)
Pt given sandwich tray and ginger ale. 

## 2020-03-30 NOTE — BH Assessment (Signed)
Attempted to assess patient with Psyc NP but patient has been medicated and is currently sleeping. Will attempt at a later time.

## 2020-03-30 NOTE — ED Notes (Signed)
Pt dressed into burgundy scrubs with this tech and [employer] officer present. Pts belongings include: red t-shirt, red shorts, red pants, pair of white shoes, and a pair of black socks. Pts belongings put in single belongings bag and handed off to receiving RN.

## 2020-03-30 NOTE — ED Triage Notes (Signed)
Pt to the er for IVC via [employer]. Pt girlfriend took out papers as pt is aggressive and threatening people at his mother's work with violence. Pt is cooperative with staff at this time but is very manic and hard to maintain focus.

## 2020-03-30 NOTE — ED Notes (Signed)
Patient IVC from home as per officer patient all over the with no clear thought process. Patient in room taking about his daughters and having a BBQ, went to [employer] bought 12 pack of hamburgers, doesn't know why she always does this to him. Pt  told md he was ok inside and did not feel like he needed any medications at present time. Patient calm and cooperative rapid speech.

## 2020-03-30 NOTE — ED Notes (Signed)
Pt up to urinate and back to bed.

## 2020-03-31 MED ORDER — ACETAMINOPHEN 500 MG PO TABS
1000.0000 mg | ORAL_TABLET | Freq: Once | ORAL | Status: AC
  Administered 2020-03-31: 1000 mg via ORAL
  Filled 2020-03-31: qty 2

## 2020-03-31 MED ORDER — DIPHENHYDRAMINE HCL 25 MG PO CAPS
25.0000 mg | ORAL_CAPSULE | Freq: Once | ORAL | Status: AC
  Administered 2020-03-31: 25 mg via ORAL
  Filled 2020-03-31: qty 1

## 2020-03-31 MED ORDER — LORAZEPAM 1 MG PO TABS
1.0000 mg | ORAL_TABLET | Freq: Once | ORAL | Status: AC
  Administered 2020-03-31: 1 mg via ORAL
  Filled 2020-03-31: qty 1

## 2020-03-31 MED ORDER — TRAZODONE HCL 100 MG PO TABS
100.0000 mg | ORAL_TABLET | Freq: Every day | ORAL | Status: DC
  Administered 2020-03-31: 100 mg via ORAL
  Filled 2020-03-31: qty 1

## 2020-03-31 MED ORDER — PALIPERIDONE PALMITATE ER 234 MG/1.5ML IM SUSY
234.0000 mg | PREFILLED_SYRINGE | Freq: Once | INTRAMUSCULAR | Status: AC
  Administered 2020-04-01: 234 mg via INTRAMUSCULAR
  Filled 2020-03-31: qty 1.5

## 2020-03-31 MED ORDER — IBUPROFEN 600 MG PO TABS: 600.0000 mg | ORAL_TABLET | Freq: Once | ORAL | Status: DC

## 2020-03-31 NOTE — ED Notes (Signed)
Pt. To BHU from ED ambulatory without difficulty, to room  BHU 1. Report from [employer]. Pt. Is alert and oriented, warm and dry in no distress. Pt. Denies SI, HI, and AVH. Pt. Calm and cooperative. Pt. Made aware of security cameras and Q15 minute rounds. Pt. Encouraged to let Nursing staff know of any concerns or needs.

## 2020-03-31 NOTE — ED Notes (Signed)
IVC/Consult Pending patient Medicated will re- assess

## 2020-03-31 NOTE — ED Provider Notes (Signed)
Emergency Medicine Observation Re-evaluation Note  Dominic [name] is a 44 y.o. male, seen on rounds today.  Pt initially presented to the ED for complaints of Behavior Problem Currently, the patient is resting in no acute distress.  Physical Exam  BP 137/83 (BP Location: Right Arm)   Pulse 84   Temp 98.4 F (36.9 C) (Oral)   Resp (!) 24   Ht 5\' 11"  (1.803 m)   Wt 97.5 kg   SpO2 99%   BMI 29.99 kg/m  Physical Exam  ED Course / MDM  EKG:    I have reviewed the labs performed to date as well as medications administered while in observation.  Recent changes in the last 24 hours include no events overnight. Plan  Current plan is for psychiatric disposition. Patient is under full IVC at this time.   , MD 03/31/20 [phone]

## 2020-03-31 NOTE — ED Notes (Signed)

## 2020-03-31 NOTE — ED Notes (Signed)
Pt given meal tray and a cup of sprite.  

## 2020-03-31 NOTE — ED Notes (Signed)
Pt asleep, breakfast tray placed in rm on chair.
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
## 2020-03-31 NOTE — ED Notes (Addendum)
Pt provided with lunch tray and a cup of sprite. Pt was offered a shower and will inform this tech when he is ready.
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
## 2020-03-31 NOTE — Consult Note (Signed)
[facility] Face-to-Face Psychiatry Consult   Reason for Consult:  Behavior Problem Referring Physician:  Dr. Scotty CourtStafford Patient Identification: Dominic [name] MRN:  [id number] Principal Diagnosis: <principal problem not specified> Diagnosis:  Active Problems:   Psychosis (HCC)   Alcohol abuse   Cocaine abuse (HCC)   Chronic hepatitis C without hepatic coma (HCC)   Asthma   Nicotine dependence, cigarettes, uncomplicated   Alcohol intoxication, uncomplicated (HCC)   Schizophrenia, chronic condition (HCC)   Schizophrenia (HCC)   History of command hallucinations   MDD (major depressive disorder), severe (HCC)   Total Time spent with patient: 30 minutes  Subjective: "Why you keep letting her take papers out on me?  That not right." Dominic [name] is a 44 y.o. male patient [facility] ED via law enforcement under involuntary commitment status (IVC). The patient girlfriend, Ms. Day, had taken IVC papers out on the patient for his aggressive and threatening behaviors towards people and his mother's work.  The patient was threatening these people with violence. On 05.18.21, attempts were made to assess the patient due to his aggressive behaviors; he had to be medicated.  Therefore, he was not evaluated until tonight, 5.19.21. The patient girlfriend reported that he has not been on his medications for a few months.  She states he is a heavy substance abuser and his UDS shows him positive for cocaine and cannabinoid.  The patient admits to drinking alcohol, but he is negative for alcohol use. The patient was seen face-to-face by this provider; chart reviewed and consulted with Dr. Colon BranchMonks on 03/31/2020 due to the patient's care. It was discussed with the EDP that the patient would remain under observation overnight, continue his monthly injection and continue to reassess for an additional night, and he could be discharged home. On evaluation, the patient is alert and oriented x 3, irritable but  cooperative, and mood-congruent with affect.  The patient does appear to be responding to internal and external stimuli. Neither is the patient presenting with any delusional thinking. The patient denies auditory or visual hallucinations. The patient denies any suicidal, homicidal, or self-harm ideations. The patient is presenting with psychotic and paranoid behaviors. During an encounter with the patient, he was able to answer questions appropriately. Collateral was provided by the patient girlfriend, Ms. Day, who expressed concern for the patient substance use.  She described that he is consuming more substances than he has ever done.  She voiced he has been off his injection and stated she does not believe it is working for him.  She says, "maybe he needs to get on something else because I do not think his psych medications work anymore."  Plan: The patient will remain under observation overnight, continue his monthly injection and continue to reassess for an additional night and he could be discharged home.  HPI: Per Dr. Scotty CourtStafford: Dominic [name] is a 44 y.o. male with a history of bipolar disorder, schizophrenia, anxiety, asthma who is brought to the ED under involuntary commitment due to agitation, making threats of violence against others, auditory hallucinations.  Patient denies any symptoms and states that he feels fine.  Girlfriend has reported that the patient does not take medications.   Past Psychiatric History:  Anxiety Bipolar 1 disorder (HCC) Depression Schizophrenia (HCC)  Risk to Self: Suicidal Ideation: No Suicidal Intent: No Is patient at risk for suicide?: No Suicidal Plan?: No Access to Means: No What has been your use of drugs/alcohol within the last 12 months?: alcohol, cocaine &  marijuana  How many times?: 1 Other Self Harm Risks: None reported  Triggers for Past Attempts: Family contact, Other personal contacts Intentional Self Injurious Behavior: None Risk to  Others: Homicidal Ideation: No Thoughts of Harm to Others: No Current Homicidal Intent: No Current Homicidal Plan: No Access to Homicidal Means: No Identified Victim: n/a History of harm to others?: No Assessment of Violence: In past 6-12 months Violent Behavior Description: Aggression  Does patient have access to weapons?: No Criminal Charges Pending?: No Does patient have a court date: No Prior Inpatient Therapy: Prior Inpatient Therapy: Yes Prior Therapy Dates: 2020 Prior Therapy Facilty/Provider(s): [facility] Reason for Treatment: SI Prior Outpatient Therapy: Prior Outpatient Therapy: Yes Prior Therapy Dates: (Pt reports a few months ago) Prior Therapy Facilty/Provider(s): [facility] Reason for Treatment: Depression Does patient have an ACCT team?: No Does patient have Intensive In-House Services?  : No Does patient have [facility] services? : No Does patient have P4CC services?: No  Past Medical History:  Past Medical History:  Diagnosis Date  . Anxiety   . Asthma   . Bipolar 1 disorder (HCC)   . Chronic hepatitis C without hepatic coma (HCC) 05/28/2018  . Depression   . Hepatitis C   . Hepatitis C antibody positive in blood 05/28/2018  . Schizophrenia (HCC)   . Sleep apnea     Past Surgical History:  Procedure Laterality Date  . CYST REMOVAL NECK     neck   Family History:  Family History  Problem Relation Age of Onset  . Hypertension Sister   . Schizophrenia Sister    Family Psychiatric  History:  Social History:  Social History   Substance and Sexual Activity  Alcohol Use Yes  . Alcohol/week: 3.0 standard drinks  . Types: 3 Shots of liquor per week     Social History   Substance and Sexual Activity  Drug Use Yes  . Types: Cocaine, Marijuana   Comment: last used cocaine Thursday and MJ use daily    Social History   Socioeconomic History  . Marital status: Single    Spouse name: Not on file  . Number of children: Not on file  . Years of education: Not on  file  . Highest education level: Not on file  Occupational History  . Not on file  Tobacco Use  . Smoking status: Current Some Day Smoker    Packs/day: 1.00    Types: Cigarettes  . Smokeless tobacco: Never Used  Substance and Sexual Activity  . Alcohol use: Yes    Alcohol/week: 3.0 standard drinks    Types: 3 Shots of liquor per week  . Drug use: Yes    Types: Cocaine, Marijuana    Comment: last used cocaine Thursday and MJ use daily  . Sexual activity: Not Currently  Other Topics Concern  . Not on file  Social History Narrative  . Not on file   Social Determinants of Health   Financial Resource Strain:   . Difficulty of Paying Living Expenses:   Food Insecurity:   . Worried About Programme researcher, broadcasting/film/video in the Last Year:   . Barista in the Last Year:   Transportation Needs:   . Freight forwarder (Medical):   Marland Kitchen Lack of Transportation (Non-Medical):   Physical Activity:   . Days of Exercise per Week:   . Minutes of Exercise per Session:   Stress:   . Feeling of Stress :   Social Connections:   . Frequency of  Communication with Friends and Family:   . Frequency of Social Gatherings with Friends and Family:   . Attends Religious Services:   . Active Member of Clubs or Organizations:   . Attends Archivist Meetings:   Marland Kitchen Marital Status:    Additional Social History:    Allergies:  No Known Allergies  Labs:  Results for orders placed or performed during the hospital encounter of 03/30/20 (from the past 48 hour(s))  Comprehensive metabolic panel     Status: None   Collection Time: 03/30/20  6:22 PM  Result Value Ref Range   Sodium 136 135 - 145 mmol/L   Potassium 3.9 3.5 - 5.1 mmol/L   Chloride 107 98 - 111 mmol/L   CO2 23 22 - 32 mmol/L   Glucose, Bld 97 70 - 99 mg/dL    Comment: Glucose reference range applies only to samples taken after fasting for at least 8 hours.   BUN 17 6 - 20 mg/dL   Creatinine, Ser 0.82 0.61 - 1.24 mg/dL   Calcium  8.9 8.9 - 10.3 mg/dL   Total Protein 7.9 6.5 - 8.1 g/dL   Albumin 4.1 3.5 - 5.0 g/dL   AST 41 15 - 41 U/L   ALT 33 0 - 44 U/L   Alkaline Phosphatase 40 38 - 126 U/L   Total Bilirubin 0.9 0.3 - 1.2 mg/dL   GFR calc non Af Amer >60 >60 mL/min   GFR calc Af Amer >60 >60 mL/min   Anion gap 6 5 - 15    Comment: Performed at [facility], [address] [place], [place] [address]  Ethanol     Status: None   Collection Time: 03/30/20  6:22 PM  Result Value Ref Range   Alcohol, Ethyl (B) <10 <10 mg/dL    Comment: (NOTE) Lowest detectable limit for serum alcohol is 10 mg/dL. For medical purposes only. Performed at [facility], [address] [place], [place] [address]   cbc     Status: Abnormal   Collection Time: 03/30/20  6:22 PM  Result Value Ref Range   WBC 11.5 (H) 4.0 - 10.5 K/uL   RBC 4.54 4.22 - 5.81 MIL/uL   Hemoglobin 13.6 13.0 - 17.0 g/dL   HCT 37.3 (L) 39.0 - 52.0 %   MCV 82.2 80.0 - 100.0 fL   MCH 30.0 26.0 - 34.0 pg   MCHC 36.5 (H) 30.0 - 36.0 g/dL   RDW 13.5 11.5 - 15.5 %   Platelets 270 150 - 400 K/uL   nRBC 0.0 0.0 - 0.2 %    Comment: Performed at [facility], [address] [place], [place] [address]  Acetaminophen level     Status: Abnormal   Collection Time: 03/30/20  6:22 PM  Result Value Ref Range   Acetaminophen (Tylenol), Serum <10 (L) 10 - 30 ug/mL    Comment: (NOTE) Therapeutic concentrations vary significantly. A range of 10-30 ug/mL  may be an effective concentration for many patients. However, some  are best treated at concentrations outside of this range. Acetaminophen concentrations >150 ug/mL at 4 hours after ingestion  and >50 ug/mL at 12 hours after ingestion are often associated with  toxic reactions. Performed at [facility], [place],  [address]   Salicylate level     Status: Abnormal   Collection Time: 03/30/20  6:22 PM  Result Value Ref Range   Salicylate Lvl <5.3 (L)  7.0 - 30.0 mg/dL  Comment: Performed at [facility], [address] [place], [place] [address]  Urine Drug Screen, Qualitative     Status: Abnormal   Collection Time: 03/30/20  8:06 PM  Result Value Ref Range   Tricyclic, Ur Screen NONE DETECTED NONE DETECTED   Amphetamines, Ur Screen NONE DETECTED NONE DETECTED   MDMA (Ecstasy)Ur Screen NONE DETECTED NONE DETECTED   Cocaine Metabolite,Ur [place] POSITIVE (A) NONE DETECTED   Opiate, Ur Screen NONE DETECTED NONE DETECTED   Phencyclidine (PCP) Ur S NONE DETECTED NONE DETECTED   Cannabinoid 50 Ng, Ur [place] POSITIVE (A) NONE DETECTED   Barbiturates, Ur Screen NONE DETECTED NONE DETECTED   Benzodiazepine, Ur Scrn NONE DETECTED NONE DETECTED   Methadone Scn, Ur NONE DETECTED NONE DETECTED    Comment: (NOTE) Tricyclics + metabolites, urine    Cutoff 1000 ng/mL Amphetamines + metabolites, urine  Cutoff 1000 ng/mL MDMA (Ecstasy), urine              Cutoff 500 ng/mL Cocaine Metabolite, urine          Cutoff 300 ng/mL Opiate + metabolites, urine        Cutoff 300 ng/mL Phencyclidine (PCP), urine         Cutoff 25 ng/mL Cannabinoid, urine                 Cutoff 50 ng/mL Barbiturates + metabolites, urine  Cutoff 200 ng/mL Benzodiazepine, urine              Cutoff 200 ng/mL Methadone, urine                   Cutoff 300 ng/mL The urine drug screen provides only a preliminary, unconfirmed analytical test result and should not be used for non-medical purposes. Clinical consideration and professional judgment should be applied to any positive drug screen result due to possible interfering substances. A more specific alternate chemical method must be used in order to obtain a confirmed analytical result. Gas chromatography / mass spectrometry (GC/MS) is the preferred confirmat ory method. Performed at [facility], [address] [place], [place] [address]   SARS Coronavirus 2 by RT PCR (hospital order, performed in [facility]  hospital lab) Nasopharyngeal Nasopharyngeal Swab     Status: None   Collection Time: 03/30/20  8:06 PM   Specimen: Nasopharyngeal Swab  Result Value Ref Range   SARS Coronavirus 2 NEGATIVE NEGATIVE    Comment: (NOTE) SARS-CoV-2 target nucleic acids are NOT DETECTED. The SARS-CoV-2 RNA is generally detectable in upper and lower respiratory specimens during the acute phase of infection. The lowest concentration of SARS-CoV-2 viral copies this assay can detect is 250 copies / mL. A negative result does not preclude SARS-CoV-2 infection and should not be used as the sole basis for treatment or other patient management decisions.  A negative result may occur with improper specimen collection / handling, submission of specimen other than nasopharyngeal swab, presence of viral mutation(s) within the areas targeted by this assay, and inadequate number of viral copies (<250 copies / mL). A negative result must be combined with clinical observations, patient history, and epidemiological information. Fact Sheet for Patients:   BoilerBrush.com.cy Fact Sheet for Healthcare Providers: https://pope.com/ This test is not yet approved or cleared  by the [place] FDA and has been authorized for detection and/or diagnosis of SARS-CoV-2 by FDA under an Emergency Use Authorization (EUA).  This EUA will remain in effect (meaning this test can be used) for the  duration of the COVID-19 declaration under Section 564(b)(1) of the Act, 21 U.S.C. section 360bbb-3(b)(1), unless the authorization is terminated or revoked sooner. Performed at [facility], [address] [place], [place] [address]     Current Facility-Administered Medications  Medication Dose Route Frequency Provider Last Rate Last Admin  . ibuprofen (ADVIL) tablet 600 mg  600 mg Oral Once Miguel Aschoff., MD   Stopped at 03/31/20 1837  . traZODone (DESYREL) tablet 100 mg  100 mg  Oral QHS Gillermo Murdoch, NP   100 mg at 03/31/20 2145   No current outpatient medications on file.    Musculoskeletal: Strength & Muscle Tone: within normal limits Gait & Station: normal Patient leans: N/A  Psychiatric Specialty Exam: Physical Exam  Nursing note and vitals reviewed. Constitutional: He is oriented to person, place, and time.  Respiratory: Effort normal.  Musculoskeletal:        General: Normal range of motion.     Cervical back: Normal range of motion and neck supple.  Neurological: He is alert and oriented to person, place, and time.    Review of Systems  Psychiatric/Behavioral: Positive for behavioral problems and sleep disturbance. The patient is nervous/anxious.   All other systems reviewed and are negative.   Blood pressure (!) 149/87, pulse 60, temperature 98.3 F (36.8 C), temperature source Oral, resp. rate 20, height 5\' 11"  (1.803 m), weight 97.5 kg, SpO2 100 %.Body mass index is 29.99 kg/m.  General Appearance: Disheveled  Eye Contact:  Fair  Speech:  Clear and Coherent and Pressured  Volume:  Normal  Mood:  Anxious and Irritable  Affect:  Congruent  Thought Process:  Coherent  Orientation:  Full (Time, Place, and Person)  Thought Content:  Logical, Obsessions, Rumination and Tangential  Suicidal Thoughts:  No  Homicidal Thoughts:  No  Memory:  Immediate;   Good Recent;   Good Remote;   Good  Judgement:  Impaired  Insight:  Lacking  Psychomotor Activity:  Increased, Restlessness and Tremor  Concentration:  Concentration: Fair and Attention Span: Fair  Recall:  of Knowledge:  Fair  Language:  Fair  Akathisia:  Negative  Handed:  Right  AIMS (if indicated):     Assets:  Communication Skills Desire for Improvement Physical Health Resilience Social Support  ADL's:  Intact  Cognition:  WNL  Sleep:    Good     Treatment Plan Summary: Medication management and Plan The patient will remain under observation overnight  and will be assessed  Disposition: No evidence of imminent risk to self or others at present.   Supportive therapy provided about ongoing stressors. The patient will remain under observation overnight restart [employer] injection 234 milligram monthly medication and monitor for an additional night.  Gean Birchwood, NP 03/31/2020 10:09 PM

## 2020-03-31 NOTE — ED Notes (Signed)
IVC/  PENDING  PLACEMENT 

## 2020-03-31 NOTE — ED Notes (Signed)
Received a call from [name] - "his fiance and the person who committed him"   She was demanding information about the pt including labs and doctors notes  I told her I cannot give out information on this pt due to he is competent and he is his own guardian  She began cursing at this Clinical research associate and making threats   One statement  "everytime he gets out of a hospital - even in the [place] he gets out and commits a felony and if he does that this time then it is the hospitals fault - If a cop shoots him it is the hospitals fault"   She became so irate towards me that she cursed and hung up   Pt provided the phone so that he may update his mother  His mothers number is [phone]

## 2020-03-31 NOTE — ED Notes (Signed)
Pt given [place] sandwich tray and a sprite per request.

## 2020-04-01 NOTE — Discharge Instructions (Addendum)
Follow-up with your regular doctor and psychiatrist.  Continue taking your medications as prescribed.  Return to the ER for new or recurrent thoughts of suicide, any other thoughts of wanting to hurt yourself or anyone else, or any other new or worsening symptoms that concern you.  

## 2020-04-01 NOTE — ED Provider Notes (Addendum)
Emergency Medicine Observation Re-evaluation Note  Dominic [name] is a 44 y.o. male, seen on rounds today.  Pt initially presented to the ED for complaints of Behavior Problem Currently, the patient is stable.  Physical Exam  BP (!) 149/87 (BP Location: Left Arm)   Pulse 60   Temp 98.3 F (36.8 C) (Oral)   Resp 20   Ht 5\' 11"  (1.803 m)   Wt 97.5 kg   SpO2 100%   BMI 29.99 kg/m  Physical Exam  ED Course / MDM  EKG:    I have reviewed the labs performed to date as well as medications administered while in observation.  Recent changes in the last 24 hours include none. Plan  Current plan is for psych disposition. Patient is under full IVC at this time.   , MD 04/01/20 04/03/20    [id number], MD 04/01/20 [phone]

## 2020-04-01 NOTE — ED Notes (Signed)
E signature page not working on mobile computer at time of discharge

## 2020-04-01 NOTE — ED Provider Notes (Signed)
4:20 PM Patient has been seen and evaluated by psychiatry team and deemed appropriate for discharge. Patient determined not to be a threat to themselves or others. IVC rescinded by psychiatry team. Patient is otherwise medically clear and stable for discharge.      Miguel Aschoff., MD 04/01/20 1620

## 2020-04-01 NOTE — BH Assessment (Signed)
TTS completed reassessment. Pt reported to be doing ok and was observed to be resting. Pt reported his daughter birthday to be today and the need to leave as soon as possible to see her. Pt agreed to the current plan to receive his IM today and understands he can not leave without the providers approval. Pt denies any SI/HI/AH/VH and was able to contract his safety.

## 2020-05-21 ENCOUNTER — Emergency Department
Admission: EM | Admit: 2020-05-21 | Discharge: 2020-05-21 | Disposition: A | Discharge status: Home / Self Care | Payer: Medicaid Other | Attending: Emergency Medicine | Admitting: Emergency Medicine

## 2020-05-21 ENCOUNTER — Other Ambulatory Visit: Payer: Self-pay

## 2020-05-21 DIAGNOSIS — F17210 Nicotine dependence, cigarettes, uncomplicated: Secondary | ICD-10-CM | POA: Insufficient documentation

## 2020-05-21 DIAGNOSIS — Z760 Encounter for issue of repeat prescription: Secondary | ICD-10-CM | POA: Diagnosis present

## 2020-05-21 DIAGNOSIS — J45909 Unspecified asthma, uncomplicated: Secondary | ICD-10-CM | POA: Diagnosis not present

## 2020-05-21 MED ORDER — TRAZODONE HCL 100 MG PO TABS: 100.0000 mg | ORAL_TABLET | Freq: Every day | ORAL | 0 refills | Status: DC

## 2020-05-21 MED ORDER — PALIPERIDONE PALMITATE ER 234 MG/1.5ML IM SUSY
234.0000 mg | PREFILLED_SYRINGE | Freq: Once | INTRAMUSCULAR | Status: AC
  Administered 2020-05-21: 234 mg via INTRAMUSCULAR
  Filled 2020-05-21: qty 1.5

## 2020-05-21 NOTE — ED Notes (Addendum)
Patient states he is here because he needs his Invega injection, he says he received it here last month. Patient also wants to take a shower.  Denies SI/HI

## 2020-05-21 NOTE — ED Triage Notes (Signed)
Pt states he just needs a refill on his medications and his monthly injection, states he has not had them since may and when he tries to go to [facility] they cant seem to find his medicaid information. Denies SI/HI

## 2020-05-21 NOTE — ED Notes (Signed)
Patient discharged home, patient received discharge papers and prescription. Patient had his belongings on him, Patient upset and refused to have his vital signs taken and refused to sign that he received his discharge papers because staff would not allow him to go to sleep in the ER after the RN told him he was discharged. Patient stated to writer and [employer] that we were rude and did not show him any hospitality. NAD noted.

## 2020-05-21 NOTE — ED Provider Notes (Signed)
[facility] Emergency Department Provider Note  Time seen: 12:40 PM  I have reviewed the triage vital signs and the nursing notes.   HISTORY  Chief Complaint Medication Refill   HPI Dominic [name] is a 44 y.o. male with a past medical history of anxiety, bipolar, depression, schizophrenia, presents to the emergency department for medication refill.  According to patient he has not received his monthly injections since leaving the hospital in May, is out of trazodone for sleep as well.  Patient is also requesting something to eat and drink.  Denies any acute medical or psychiatric complications.  States he just wants to get back onto his medications.   Past Medical History:  Diagnosis Date  . Anxiety   . Asthma   . Bipolar 1 disorder (HCC)   . Chronic hepatitis C without hepatic coma (HCC) 05/28/2018  . Depression   . Hepatitis C   . Hepatitis C antibody positive in blood 05/28/2018  . Schizophrenia (HCC)   . Sleep apnea     Patient Active Problem List   Diagnosis Date Noted  . MDD (major depressive disorder), severe (HCC) 07/15/2019  . History of command hallucinations   . Schizophrenia (HCC) 05/22/2019  . Schizophrenia, chronic condition (HCC) 01/14/2019  . Alcohol intoxication, uncomplicated (HCC) 01/13/2019  . Asthma 11/25/2018  . Nicotine dependence, cigarettes, uncomplicated 11/25/2018  . Chronic hepatitis C without hepatic coma (HCC) 05/28/2018  . Psychosis (HCC) 02/16/2016  . Alcohol abuse 02/16/2016  . Cocaine abuse (HCC) 02/16/2016    Past Surgical History:  Procedure Laterality Date  . CYST REMOVAL NECK     neck    Prior to Admission medications   Not on File    No Known Allergies  Family History  Problem Relation Age of Onset  . Hypertension Sister   . Schizophrenia Sister     Social History Social History   Tobacco Use  . Smoking status: Current Some Day Smoker    Packs/day: 1.00    Types: Cigarettes  . Smokeless  tobacco: Never Used  Vaping Use  . Vaping Use: Never used  Substance Use Topics  . Alcohol use: Yes    Alcohol/week: 3.0 standard drinks    Types: 3 Shots of liquor per week  . Drug use: Yes    Types: Cocaine, Marijuana    Comment: last used cocaine Thursday and MJ use daily    Review of Systems Constitutional: Negative for fever. Cardiovascular: Negative for chest pain. Respiratory: Negative for shortness of breath. Gastrointestinal: Negative for abdominal pain Neurological: Negative for headache All other ROS negative  ____________________________________________   PHYSICAL EXAM:  VITAL SIGNS: ED Triage Vitals  Enc Vitals Group     BP 05/21/20 1217 112/66     Pulse Rate 05/21/20 1217 79     Resp 05/21/20 1217 17     Temp 05/21/20 1218 98.5 F (36.9 C)     Temp Source 05/21/20 1217 Oral     SpO2 05/21/20 1217 96 %     Weight 05/21/20 1219 165 lb (74.8 kg)     Height 05/21/20 1219 5\' 10"  (1.778 m)     Head Circumference --      Peak Flow --      Pain Score 05/21/20 1218 0     Pain Loc --      Pain Edu? --      Excl. in GC? --     Constitutional: Alert and oriented. Well appearing and in no  distress. Eyes: Normal exam ENT      Head: Normocephalic and atraumatic.      Mouth/Throat: Mucous membranes are moist. Cardiovascular: Normal rate, regular rhythm.  Respiratory: Normal respiratory effort without tachypnea nor retractions. Breath sounds are clear Gastrointestinal: Soft and nontender. No distention.   Musculoskeletal: Nontender with normal range of motion in all extremities.  Neurologic:  Normal speech and language. No gross focal neurologic deficits  Skin:  Skin is warm, dry and intact.  Psychiatric: Mood and affect are normal.  Calm and cooperative.  ____________________________________________   INITIAL IMPRESSION / ASSESSMENT AND PLAN / ED COURSE  Pertinent labs & imaging results that were available during my care of the patient were reviewed by me  and considered in my medical decision making (see chart for details).   Patient presents to the emergency department for medication refill.  Patient states he is supposed to get a monthly injection but did not gotten it since May when he was hospitalized.  Also states he is out of trazodone.  Patient has no medical concerns today.  No SI or HI does not meet commitment criteria.  We will attempt to obtain the patient's monthly injection in the emergency department and prescribed his trazodone.  Patient agreeable to plan of care.  I reviewed the patient's records including his recent ER stay in May.  Patient was given Invega 234 mg.  We will dose Invega 234 mg today.  Patient was also prescribed trazodone 100 mg nightly.  We will prescribe a 30-day prescription for the patient having follow-up with [facility].  Dominic [name] was evaluated in Emergency Department on 05/21/2020 for the symptoms described in the history of present illness. He was evaluated in the context of the global COVID-19 pandemic, which necessitated consideration that the patient might be at risk for infection with the SARS-CoV-2 virus that causes COVID-19. Institutional protocols and algorithms that pertain to the evaluation of patients at risk for COVID-19 are in a state of rapid change based on information released by regulatory bodies including the CDC and federal and state organizations. These policies and algorithms were followed during the patient's care in the ED.  ____________________________________________   FINAL CLINICAL IMPRESSION(S) / ED DIAGNOSES  Medication refill   Minna Antis, MD 05/21/20 1247

## 2020-05-21 NOTE — ED Notes (Signed)
Patient given Invega injection 234 mg and tolerated it well

## 2020-05-28 ENCOUNTER — Other Ambulatory Visit: Payer: Self-pay

## 2020-05-28 ENCOUNTER — Emergency Department: Payer: No Typology Code available for payment source

## 2020-05-28 ENCOUNTER — Emergency Department
Admission: EM | Admit: 2020-05-28 | Discharge: 2020-05-31 | Disposition: A | Discharge status: Home / Self Care | Payer: No Typology Code available for payment source | Attending: Emergency Medicine | Admitting: Emergency Medicine

## 2020-05-28 DIAGNOSIS — F1010 Alcohol abuse, uncomplicated: Secondary | ICD-10-CM | POA: Diagnosis present

## 2020-05-28 DIAGNOSIS — Z20822 Contact with and (suspected) exposure to covid-19: Secondary | ICD-10-CM | POA: Diagnosis not present

## 2020-05-28 DIAGNOSIS — F17210 Nicotine dependence, cigarettes, uncomplicated: Secondary | ICD-10-CM | POA: Insufficient documentation

## 2020-05-28 DIAGNOSIS — F1910 Other psychoactive substance abuse, uncomplicated: Secondary | ICD-10-CM | POA: Diagnosis not present

## 2020-05-28 DIAGNOSIS — R45850 Homicidal ideations: Secondary | ICD-10-CM | POA: Diagnosis not present

## 2020-05-28 DIAGNOSIS — J45909 Unspecified asthma, uncomplicated: Secondary | ICD-10-CM | POA: Diagnosis not present

## 2020-05-28 DIAGNOSIS — F209 Schizophrenia, unspecified: Secondary | ICD-10-CM | POA: Diagnosis not present

## 2020-05-28 DIAGNOSIS — F1410 Cocaine abuse, uncomplicated: Secondary | ICD-10-CM | POA: Diagnosis present

## 2020-05-28 DIAGNOSIS — Z8659 Personal history of other mental and behavioral disorders: Secondary | ICD-10-CM

## 2020-05-28 LAB — URINE DRUG SCREEN, QUALITATIVE (ARMC ONLY)
Amphetamines, Ur Screen: NOT DETECTED
Barbiturates, Ur Screen: NOT DETECTED
Benzodiazepine, Ur Scrn: POSITIVE — AB
Cannabinoid 50 Ng, Ur ~~LOC~~: POSITIVE — AB
Cocaine Metabolite,Ur ~~LOC~~: POSITIVE — AB
MDMA (Ecstasy)Ur Screen: NOT DETECTED
Methadone Scn, Ur: NOT DETECTED
Opiate, Ur Screen: NOT DETECTED
Phencyclidine (PCP) Ur S: NOT DETECTED
Tricyclic, Ur Screen: NOT DETECTED

## 2020-05-28 LAB — CBC
HCT: 36.6 % — ABNORMAL LOW (ref 39.0–52.0)
Hemoglobin: 13.6 g/dL (ref 13.0–17.0)
MCH: 30 pg (ref 26.0–34.0)
MCHC: 37.2 g/dL — ABNORMAL HIGH (ref 30.0–36.0)
MCV: 80.6 fL (ref 80.0–100.0)
Platelets: 187 10*3/uL (ref 150–400)
RBC: 4.54 MIL/uL (ref 4.22–5.81)
RDW: 14.1 % (ref 11.5–15.5)
WBC: 12.5 10*3/uL — ABNORMAL HIGH (ref 4.0–10.5)
nRBC: 0.6 % — ABNORMAL HIGH (ref 0.0–0.2)

## 2020-05-28 LAB — COMPREHENSIVE METABOLIC PANEL
ALT: 48 U/L — ABNORMAL HIGH (ref 0–44)
AST: 62 U/L — ABNORMAL HIGH (ref 15–41)
Albumin: 4.4 g/dL (ref 3.5–5.0)
Alkaline Phosphatase: 40 U/L (ref 38–126)
Anion gap: 9 (ref 5–15)
BUN: 11 mg/dL (ref 6–20)
CO2: 22 mmol/L (ref 22–32)
Calcium: 8.6 mg/dL — ABNORMAL LOW (ref 8.9–10.3)
Chloride: 106 mmol/L (ref 98–111)
Creatinine, Ser: 0.98 mg/dL (ref 0.61–1.24)
GFR calc Af Amer: >60 mL/min (ref >60)
GFR calc non Af Amer: >60 mL/min (ref >60)
Glucose, Bld: 110 mg/dL — ABNORMAL HIGH (ref 70–99)
Potassium: 3.5 mmol/L (ref 3.5–5.1)
Sodium: 137 mmol/L (ref 135–145)
Total Bilirubin: 0.8 mg/dL (ref 0.3–1.2)
Total Protein: 8.2 g/dL — ABNORMAL HIGH (ref 6.5–8.1)

## 2020-05-28 LAB — SALICYLATE LEVEL: Salicylate Lvl: <7 mg/dL — ABNORMAL LOW (ref 7.0–30.0)

## 2020-05-28 LAB — ETHANOL: Alcohol, Ethyl (B): 117 mg/dL — ABNORMAL HIGH (ref <10)

## 2020-05-28 LAB — SARS CORONAVIRUS 2 BY RT PCR (HOSPITAL ORDER, PERFORMED IN ~~LOC~~ HOSPITAL LAB): SARS Coronavirus 2: NEGATIVE

## 2020-05-28 LAB — ACETAMINOPHEN LEVEL: Acetaminophen (Tylenol), Serum: <10 ug/mL — ABNORMAL LOW (ref 10–30)

## 2020-05-28 MED ORDER — DIPHENHYDRAMINE HCL 50 MG/ML IJ SOLN
INTRAMUSCULAR | Status: AC
  Administered 2020-05-28: 50 mg via INTRAMUSCULAR
  Filled 2020-05-28: qty 1

## 2020-05-28 MED ORDER — HALOPERIDOL LACTATE 5 MG/ML IJ SOLN
INTRAMUSCULAR | Status: AC
  Administered 2020-05-28: 5 mg via INTRAMUSCULAR
  Filled 2020-05-28: qty 1

## 2020-05-28 MED ORDER — TETANUS-DIPHTH-ACELL PERTUSSIS 5-2.5-18.5 LF-MCG/0.5 IM SUSP: 0.5000 mL | Freq: Once | INTRAMUSCULAR | Status: DC

## 2020-05-28 MED ORDER — LORAZEPAM 2 MG/ML IJ SOLN
INTRAMUSCULAR | Status: AC
  Administered 2020-05-28: 2 mg via INTRAMUSCULAR
  Filled 2020-05-28: qty 1

## 2020-05-28 MED ORDER — LORAZEPAM 2 MG/ML IJ SOLN: 2.0000 mg | Freq: Once | INTRAMUSCULAR | Status: AC

## 2020-05-28 MED ORDER — HALOPERIDOL LACTATE 5 MG/ML IJ SOLN: 5.0000 mg | Freq: Once | INTRAMUSCULAR | Status: AC

## 2020-05-28 MED ORDER — DIPHENHYDRAMINE HCL 50 MG/ML IJ SOLN: 50.0000 mg | Freq: Once | INTRAMUSCULAR | Status: AC

## 2020-05-28 NOTE — BH Assessment (Signed)
Writer unable to complete TTS consult. Patient is unable to participate in the interview due to medications.
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
## 2020-05-28 NOTE — ED Notes (Signed)
Pt medicated by this RN and Morrie Sheldon. RN without incident

## 2020-05-28 NOTE — ED Notes (Signed)
Pt given meal tray.

## 2020-05-28 NOTE — ED Notes (Signed)
Pt alert. Pt ambulated to restroom with slight limp. Pt given crackers with peanut butter and ginger ale

## 2020-05-28 NOTE — ED Notes (Signed)
Pt dressed out by this RN and Lorin Picket, EDT. Pt has 1 pair of nike sweat shorts and 1 pair of red boxers
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
## 2020-05-28 NOTE — ED Notes (Signed)
X-ray at bedside
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
## 2020-05-28 NOTE — ED Notes (Signed)
Unable to dress out pt at this time. Pt has just shorts and underwear on. Pt is handcuffed and stated to [employer] that he would fight when they took them off. Pt has been padded down by [employer]. Pt does have wallet in pocket.

## 2020-05-28 NOTE — ED Notes (Signed)
Pt escorted to the quad, in handcuffs by [employer]. Pt yelling and cursing in the hallway. Pt verbally aggressive towards staff and [employer]. Pt is unable to be verbally redirected at this time, EDP called for medication orders. Verbal orders given for Benadryl 50 mg, Ativan 2 mg, and Haldol 5 mg.

## 2020-05-28 NOTE — ED Notes (Signed)
Pt returned from restroom with urine on his pants. Pt was given clean underwear and pants to change into

## 2020-05-28 NOTE — ED Notes (Signed)
Pt meal tray at nurses station for when pt wakes up

## 2020-05-28 NOTE — ED Notes (Signed)
Pt transported to CT by [place] and [place], [employer], [employer], and [place], [place]

## 2020-05-29 ENCOUNTER — Inpatient Hospital Stay
Admission: EM | Admit: 2020-05-29 | Payer: No Typology Code available for payment source | Source: Intra-hospital | Admitting: Psychiatry

## 2020-05-29 DIAGNOSIS — F1410 Cocaine abuse, uncomplicated: Secondary | ICD-10-CM | POA: Diagnosis not present

## 2020-05-29 DIAGNOSIS — F1010 Alcohol abuse, uncomplicated: Secondary | ICD-10-CM | POA: Diagnosis not present

## 2020-05-29 DIAGNOSIS — Z8659 Personal history of other mental and behavioral disorders: Secondary | ICD-10-CM

## 2020-05-29 DIAGNOSIS — F17210 Nicotine dependence, cigarettes, uncomplicated: Secondary | ICD-10-CM

## 2020-05-29 DIAGNOSIS — F209 Schizophrenia, unspecified: Secondary | ICD-10-CM | POA: Diagnosis not present

## 2020-05-29 MED ORDER — LORAZEPAM 2 MG PO TABS
2.0000 mg | ORAL_TABLET | Freq: Four times a day (QID) | ORAL | Status: DC | PRN
  Administered 2020-05-29 – 2020-05-30 (×3): 2 mg via ORAL
  Filled 2020-05-29 (×4): qty 1

## 2020-05-29 MED ORDER — NAPROXEN 500 MG PO TABS
500.0000 mg | ORAL_TABLET | Freq: Two times a day (BID) | ORAL | Status: DC | PRN
  Administered 2020-05-29 – 2020-05-31 (×3): 500 mg via ORAL
  Filled 2020-05-29 (×8): qty 1

## 2020-05-29 MED ORDER — ARIPIPRAZOLE 5 MG PO TABS
15.0000 mg | ORAL_TABLET | Freq: Every day | ORAL | Status: DC
  Administered 2020-05-30 – 2020-05-31 (×2): 15 mg via ORAL
  Filled 2020-05-29 (×2): qty 3

## 2020-05-29 MED ORDER — TRAZODONE HCL 100 MG PO TABS
100.0000 mg | ORAL_TABLET | Freq: Every day | ORAL | Status: DC
  Administered 2020-05-29 – 2020-05-30 (×2): 100 mg via ORAL
  Filled 2020-05-29 (×2): qty 1

## 2020-05-29 MED ORDER — ACETAMINOPHEN 325 MG PO TABS
650.0000 mg | ORAL_TABLET | Freq: Once | ORAL | Status: AC
  Administered 2020-05-29: 650 mg via ORAL
  Filled 2020-05-29: qty 2

## 2020-05-29 NOTE — BH Assessment (Signed)
Patient unable to be admitted to [facility] tonight due to staffing issues. Patient to be admitted to [facility] 05/30/20 after 7:30PM

## 2020-05-29 NOTE — ED Notes (Signed)
Hourly rounding reveals patient in room. No complaints, stable, in no acute distress. Q15 minute rounds and monitoring via Rover and Officer to continue.   

## 2020-05-29 NOTE — ED Notes (Signed)
Pt's partner called and was tearful, reporting that pt is not himself, that he is danger to himself and others. That he reacts better to long acting medication and Julianne Rice is working for him currently. She reports that [employer] isn't working for the patient that he has become intolerant to it.  Information passed to TTS will be related in shift report to next RN.
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
## 2020-05-29 NOTE — ED Notes (Signed)
Pt requested phone. Let pt know that phone time starts at 5 pm. York Spaniel he has to be at work at 3 so he needs to call his job informed him it is past 3 and I was sorry but it was not phone time yet. Proceeded to say he has back pain no one has helped him with I told him I would let the nurse know.

## 2020-05-29 NOTE — BH Assessment (Signed)
Patient is to be admitted to [facility] by Dr. Viviano Simas.  Attending Physician will be Dr. Smith Robert.   Patient has been assigned to room 312, by [facility] Charge Nurse Spivey.   ER staff is aware of the admission:  Dr. Scotty Court, ER MD   Lorene Dy, Patient Access.

## 2020-05-29 NOTE — ED Notes (Signed)
Girlfriend has called [facility] today. Informed her cannot give information without pt permission and he is sleeping at this time.  Have informed her both times will request psych call her when they come around.  Both times she has called and been asking to speak with them.

## 2020-05-30 DIAGNOSIS — F17213 Nicotine dependence, cigarettes, with withdrawal: Secondary | ICD-10-CM

## 2020-05-30 DIAGNOSIS — F1010 Alcohol abuse, uncomplicated: Secondary | ICD-10-CM | POA: Diagnosis not present

## 2020-05-30 DIAGNOSIS — F1410 Cocaine abuse, uncomplicated: Secondary | ICD-10-CM | POA: Diagnosis not present

## 2020-05-30 DIAGNOSIS — R45850 Homicidal ideations: Secondary | ICD-10-CM

## 2020-05-30 DIAGNOSIS — F209 Schizophrenia, unspecified: Secondary | ICD-10-CM | POA: Diagnosis not present

## 2020-05-30 MED ORDER — ARIPIPRAZOLE ER 400 MG IM SRER: 400.0000 mg | Freq: Once | INTRAMUSCULAR | Status: DC

## 2020-05-30 MED ORDER — ACETAMINOPHEN 325 MG PO TABS
650.0000 mg | ORAL_TABLET | Freq: Four times a day (QID) | ORAL | Status: DC | PRN
  Administered 2020-05-30: 650 mg via ORAL
  Filled 2020-05-30: qty 2
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
## 2020-05-30 NOTE — ED Notes (Signed)
Hourly rounding reveals patient sleeping in room. No complaints, stable, in no acute distress. Q15 minute rounds and monitoring via Security Cameras to continue. 

## 2020-05-30 NOTE — ED Notes (Signed)
Hourly rounding reveals patient awake in room. No complaints, stable, in no acute distress. Q15 minute rounds and monitoring via Security Cameras to continue. 

## 2020-05-30 NOTE — ED Notes (Signed)
Lunch tray given. 

## 2020-05-30 NOTE — ED Notes (Signed)
Patient talking to Dr. Viviano Simas, patient says he is ready to leave because he has a job at [employer]. Dr. Viviano Simas explained to patient that she wants to monitor the psychiatric medication she prescribed. She informed him that if he tolerates the medication well, he has a good chance of going home this week. Dr. Viviano Simas spoke with patients supervisor at [facility] [employer] (with patient permission).

## 2020-05-30 NOTE — ED Provider Notes (Signed)
Emergency Medicine Observation Re-evaluation Note  Dominic [name] is a 44 y.o. male, seen on rounds today.  Pt initially presented to the ED for complaints of IVC Currently, the patient is requesting to go to work. Explained to pt that he was under IVC.   Physical Exam  BP 130/79 (BP Location: Right Arm)   Pulse 65   Temp 98 F (36.7 C) (Oral)   Resp 18   Ht 5\' 10"  (1.778 m)   Wt 74.8 kg   SpO2 100%   BMI 23.68 kg/m  Physical Exam Physical Exam General: No apparent distress HEENT: moist mucous membranes CV: RRR Pulm: Normal WOB GI: soft and non tender MSK: no edema or cyanosis Neuro: face symmetric, moving all extremities     ED Course / MDM  EKG:   I have reviewed the labs performed to date as well as medications administered while in observation.  Recent changes in the last 24 hours include none  Plan  Current plan is for admit  Patient is under full IVC at this time.   , MD 05/30/20 [id number]

## 2020-05-30 NOTE — ED Notes (Signed)
Pt to door c/o pain 10/10 and need for restroom. Pt given medication per order, provided with water and crackers.  Pt returned to room, pt cooperative with staff.

## 2020-05-30 NOTE — ED Notes (Signed)
Report to include Situation, Background, Assessment, and Recommendations received from Jadeka RN. Patient alert and oriented, warm and dry, in no acute distress. Patient denies SI, HI, AVH and pain. Patient made aware of Q15 minute rounds and security cameras for their safety. Patient instructed to come to me with needs or concerns. 

## 2020-05-30 NOTE — ED Notes (Signed)
Shower supply given.

## 2020-05-31 NOTE — ED Notes (Signed)
Hourly rounding reveals patient sleeping in room. No complaints, stable, in no acute distress. Q15 minute rounds and monitoring via Security Cameras to continue. 
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
## 2020-05-31 NOTE — ED Notes (Signed)
Pt refused D/C VS.  Pt stated he has to walk to the funeral home across town and needed to go.

## 2020-05-31 NOTE — ED Notes (Signed)
Pt discharged home.  Refused VS.  All belongings returned to pt (including wallet).  Discharge instructions reviewed with patient. Pt signed for discharged.

## 2020-05-31 NOTE — ED Notes (Signed)
RN spoke with patient's mother Wynona Canes) on the phone.  Wynona Canes states she does not have safety concerns about the patient. "He is talking with sense - like he should."

## 2020-05-31 NOTE — Discharge Instructions (Signed)
[facility] ([facility]stance Use Services) & [facility]  [place] Address: [address], [place], [place] [address] Hours:  Open ? Closes 5PM Phone: [phone]    Schizophrenia Schizophrenia is a mental illness. It may cause disturbed or disorganized thinking, speech, or behavior. People with schizophrenia have problems functioning in one or more areas of life. People with schizophrenia are at increased risk for suicide, certain long-term (chronic) physical illnesses, and unhealthy behaviors, such as smoking and drug use. People who have family members with schizophrenia are at higher risk of developing the illness. Schizophrenia affects men and women equally, but it usually appears at an earlier age (teenage or early adult years) in men. What are the causes? The cause of this condition is not known. What increases the risk? The following factors may make you more likely to develop this condition:  Having a family member who has schizophrenia. Some gene combinations may increase the risk, but there is no single gene that causes schizophrenia.  Impaired brain or neurotransmitter development or chemistry. Neurotransmitters are chemicals in the brain. What are the signs or symptoms? The earliest symptoms are often subtle and may go unnoticed until the illness becomes more severe (first-break psychosis). Symptoms of schizophrenia may be ongoing (continuous) or may come and go in severity. Episodes are often triggered by major life events, such as:  Family stress.  College.  Financial planner.  Marriage.  Pregnancy or childbirth.  Divorce.  Loss of a loved one. Symptoms may include:  Seeing, hearing, or feeling things that do not exist (hallucinations).  Having false beliefs (delusions). Delusions often involve beliefs that you are being attacked,  harassed, cheated, persecuted, or conspired against (persecutory delusions).  Speech that does not make sense to others or is hard to understand (incoherent).  Behavior that is odd, confused, unfocused, withdrawn, or disorganized.  Extremely overactive or underactive motor activity (catatonia). Motor activity is any action that involves the muscles.  Bland or blunted emotions (flat affect).  Loss of will power (avolition).  Withdrawal from social contacts (social isolation). Symptoms may affect the level of functioning in one or more major areas of life, such as work, school, relationships, or self-care. How is this diagnosed? Schizophrenia is diagnosed through an assessment by a mental health care provider.  Your mental health care provider may ask questions about: ? Your thoughts, behavior, and mood. ? Your ability to function in daily life. ? Your medical history. ? Any use of alcohol or drugs, including prescription medicines.  You may have blood tests and imaging exams. How is this treated? Schizophrenia is a chronic illness that is best controlled with continuous treatment rather than treatment only when symptoms occur. The following treatments are used to manage schizophrenia:  Medicine. This is the most effective and important form of treatment for schizophrenia. Antipsychotic medicines are usually prescribed to help manage schizophrenia. Other types of medicine may be added to relieve any symptoms that may occur despite the use of antipsychotic medicines.  Counseling or talk therapy. Individual, group, or family counseling may be helpful in providing education, support, and guidance. Many people also benefit from social skills and job skills (vocational) training. A combination of medicine and counseling is best for managing the disorder over time. A procedure in which electricity is applied to the brain through the scalp (electroconvulsive therapy) may be used to treat  catatonic schizophrenia or schizophrenia in  people who cannot take medicine or do not respond to medicine and counseling. Follow these instructions at home:   Keep stress under control. Stress may trigger psychosis and make symptoms worse.  Try to get as much sleep as you can.  Avoid alcohol and drugs. They can affect how medicine works and make symptoms worse.  Surround yourself with people who care about you and can help you manage your condition.  Take over-the-counter and prescription medicines only as told by your health care provider.  Keep all follow-up visits as told by your health care provider and counselor. This is important. Contact a health care provider if:  You have a bad response to changes in medicines or to your treatment plan.  You have trouble falling sleep.  You have a low mood that will not go away.  You are using: ? Drugs. ? Too much caffeine. ? Tobacco products. ? Alcohol. Get help right away if:  You feel out of control.  You or others notice warning signs of suicide such as: ? Increased use of drugs or alcohol ? Expressing feelings of not having a purpose in life, being trapped, guilty, anxious and agitated, or hopeless. ? Withdrawing from friends and family. ? Showing uncontrolled anger, recklessness, and dramatic mood changes. ? Talking about suicide, discussing or searching for methods. If you ever feel like you may hurt yourself or others, or have thoughts about taking your own life, get help right away. You can go to your nearest emergency department or call:  Your local emergency services (911 in the [place]  A suicide crisis helpline, such as the National Suicide Prevention Lifeline at 647-728-4590. This is open 24 hours a day. Summary  Schizophrenia is a mental illness that causes disturbed or disorganized thinking, speech, or behavior.  Symptoms of schizophrenia may be ongoing or may come and go. They are often triggered by major life  events.  Keep stress under control. Stress may trigger psychosis and make symptoms worse.  Avoid alcohol and drugs. They can affect how medicine works and make symptoms worse.  Get help right away if you feel out of control. This information is not intended to replace advice given to you by your health care provider. Make sure you discuss any questions you have with your health care provider. Document Revised: 10/12/2017 Document Reviewed: 08/11/2016 Elsevier Patient Education  2020 [employer].

## 2020-05-31 NOTE — ED Notes (Signed)
IVC PAPERS  RESCINDED PER  JAMIE  LORD NP  INFORMED  RN  AMY

## 2020-07-31 ENCOUNTER — Other Ambulatory Visit: Payer: Self-pay

## 2020-07-31 ENCOUNTER — Emergency Department
Admission: EM | Admit: 2020-07-31 | Discharge: 2020-08-02 | Disposition: A | Discharge status: Home / Self Care | Payer: No Typology Code available for payment source | Attending: Emergency Medicine | Admitting: Emergency Medicine

## 2020-07-31 DIAGNOSIS — R456 Violent behavior: Secondary | ICD-10-CM | POA: Diagnosis not present

## 2020-07-31 DIAGNOSIS — J45909 Unspecified asthma, uncomplicated: Secondary | ICD-10-CM | POA: Insufficient documentation

## 2020-07-31 DIAGNOSIS — R454 Irritability and anger: Secondary | ICD-10-CM | POA: Insufficient documentation

## 2020-07-31 DIAGNOSIS — Y906 Blood alcohol level of 120-199 mg/100 ml: Secondary | ICD-10-CM | POA: Insufficient documentation

## 2020-07-31 DIAGNOSIS — R451 Restlessness and agitation: Secondary | ICD-10-CM | POA: Insufficient documentation

## 2020-07-31 DIAGNOSIS — Z20822 Contact with and (suspected) exposure to covid-19: Secondary | ICD-10-CM | POA: Diagnosis not present

## 2020-07-31 DIAGNOSIS — F17210 Nicotine dependence, cigarettes, uncomplicated: Secondary | ICD-10-CM | POA: Diagnosis not present

## 2020-07-31 DIAGNOSIS — R45850 Homicidal ideations: Secondary | ICD-10-CM | POA: Insufficient documentation

## 2020-07-31 DIAGNOSIS — R4689 Other symptoms and signs involving appearance and behavior: Secondary | ICD-10-CM

## 2020-07-31 LAB — COMPREHENSIVE METABOLIC PANEL
ALT: 33 U/L (ref 0–44)
AST: 37 U/L (ref 15–41)
Albumin: 4.3 g/dL (ref 3.5–5.0)
Alkaline Phosphatase: 45 U/L (ref 38–126)
Anion gap: 10 (ref 5–15)
BUN: 12 mg/dL (ref 6–20)
CO2: 20 mmol/L — ABNORMAL LOW (ref 22–32)
Calcium: 8.9 mg/dL (ref 8.9–10.3)
Chloride: 107 mmol/L (ref 98–111)
Creatinine, Ser: 0.83 mg/dL (ref 0.61–1.24)
GFR calc Af Amer: >60 mL/min (ref >60)
GFR calc non Af Amer: >60 mL/min (ref >60)
Glucose, Bld: 82 mg/dL (ref 70–99)
Potassium: 3.4 mmol/L — ABNORMAL LOW (ref 3.5–5.1)
Sodium: 137 mmol/L (ref 135–145)
Total Bilirubin: 0.7 mg/dL (ref 0.3–1.2)
Total Protein: 8.4 g/dL — ABNORMAL HIGH (ref 6.5–8.1)

## 2020-07-31 LAB — CBC
HCT: 38.9 % — ABNORMAL LOW (ref 39.0–52.0)
Hemoglobin: 14.3 g/dL (ref 13.0–17.0)
MCH: 29.5 pg (ref 26.0–34.0)
MCHC: 36.8 g/dL — ABNORMAL HIGH (ref 30.0–36.0)
MCV: 80.4 fL (ref 80.0–100.0)
Platelets: 182 10*3/uL (ref 150–400)
RBC: 4.84 MIL/uL (ref 4.22–5.81)
RDW: 14 % (ref 11.5–15.5)
WBC: 10.3 10*3/uL (ref 4.0–10.5)
nRBC: 0 % (ref 0.0–0.2)

## 2020-07-31 LAB — ETHANOL: Alcohol, Ethyl (B): 132 mg/dL — ABNORMAL HIGH (ref <10)

## 2020-07-31 LAB — SALICYLATE LEVEL: Salicylate Lvl: <7 mg/dL — ABNORMAL LOW (ref 7.0–30.0)

## 2020-07-31 LAB — ACETAMINOPHEN LEVEL: Acetaminophen (Tylenol), Serum: <10 ug/mL — ABNORMAL LOW (ref 10–30)

## 2020-07-31 MED ORDER — HALOPERIDOL LACTATE 5 MG/ML IJ SOLN
5.0000 mg | Freq: Once | INTRAMUSCULAR | Status: AC
  Administered 2020-07-31: 5 mg via INTRAMUSCULAR
  Filled 2020-07-31: qty 1

## 2020-07-31 MED ORDER — MIDAZOLAM HCL 2 MG/2ML IJ SOLN
2.0000 mg | Freq: Once | INTRAMUSCULAR | Status: AC
  Administered 2020-07-31: 2 mg via INTRAMUSCULAR
  Filled 2020-07-31: qty 2

## 2020-07-31 MED ORDER — DIPHENHYDRAMINE HCL 50 MG/ML IJ SOLN
50.0000 mg | Freq: Once | INTRAMUSCULAR | Status: AC
  Administered 2020-07-31: 50 mg via INTRAMUSCULAR
  Filled 2020-07-31: qty 1

## 2020-07-31 NOTE — ED Provider Notes (Signed)
[facility] Emergency Department Provider Note  ____________________________________________   First MD Initiated Contact with Patient 07/31/20 2252     (approximate)  I have reviewed the triage vital signs and the nursing notes.   HISTORY  Chief Complaint Psychiatric Evaluation   HPI Dominic [name] is a 44 y.o. male past medical history anxiety, asthma, multiple disorder, chronic hep C, depression, schizophrenia, and sleep apnea who presents in police custody after they feel the IVC paperwork for reported bizarre and threatening behavior.  Patient reportedly telling police there are voices in his head telling him to kill people.  On my interview patient is extremely tangential and difficult to direct.  He is hyper focused on a family member doing drugs and getting revenge on someone who seems to have supplied drugs to this person.  He denies SI but will not engage in further direct interview questions.  Further history is limited from the patient secondary to his refusal to participate.         Past Medical History:  Diagnosis Date  . Anxiety   . Asthma   . Bipolar 1 disorder (HCC)   . Chronic hepatitis C without hepatic coma (HCC) 05/28/2018  . Depression   . Hepatitis C   . Hepatitis C antibody positive in blood 05/28/2018  . Schizophrenia (HCC)   . Sleep apnea     Patient Active Problem List   Diagnosis Date Noted  . Schizophrenia (HCC) 05/22/2019  . Schizophrenia, chronic condition (HCC) 01/14/2019  . Alcohol intoxication, uncomplicated (HCC) 01/13/2019  . Asthma 11/25/2018  . Nicotine dependence, cigarettes, uncomplicated 11/25/2018  . Chronic hepatitis C without hepatic coma (HCC) 05/28/2018  . Psychosis (HCC) 02/16/2016  . Alcohol abuse 02/16/2016  . Cocaine abuse (HCC) 02/16/2016    Past Surgical History:  Procedure Laterality Date  . CYST REMOVAL NECK     neck    Prior to Admission medications   Medication Sig Start Date  End Date Taking? Authorizing Provider  ARIPiprazole Lauroxil (ARISTADA IM) Inject 1 mL/L into the muscle every 30 (thirty) days.    [provider]  traZODone (DESYREL) 100 MG tablet Take 1 tablet (100 mg total) by mouth at bedtime. 05/21/20   Minna Antis, MD    Allergies Patient has no known allergies.  Family History  Problem Relation Age of Onset  . Hypertension Sister   . Schizophrenia Sister     Social History Social History   Tobacco Use  . Smoking status: Current Some Day Smoker    Packs/day: 1.00    Types: Cigarettes  . Smokeless tobacco: Never Used  Vaping Use  . Vaping Use: Never used  Substance Use Topics  . Alcohol use: Yes    Alcohol/week: 3.0 standard drinks    Types: 3 Shots of liquor per week  . Drug use: Yes    Types: Cocaine, Marijuana    Comment: last used cocaine Thursday and MJ use daily    Review of Systems  Review of Systems  Unable to perform ROS: Mental acuity      ____________________________________________   PHYSICAL EXAM:  VITAL SIGNS: ED Triage Vitals  Enc Vitals Group     BP 07/31/20 2224 109/62     Pulse Rate 07/31/20 2224 73     Resp 07/31/20 2244 20     Temp 07/31/20 2224 98.4 F (36.9 C)     Temp Source 07/31/20 2224 Oral     SpO2 07/31/20 2224 94 %  Weight 07/31/20 2222 200 lb (90.7 kg)     Height 07/31/20 2222 5\' 11"  (1.803 m)     Head Circumference --      Peak Flow --      Pain Score --      Pain Loc --      Pain Edu? --      Excl. in GC? --    Vitals:   07/31/20 2224 07/31/20 2244  BP: 109/62   Pulse: 73   Resp:  20  Temp: 98.4 F (36.9 C)   SpO2: 94%    Physical Exam Vitals and nursing note reviewed.  Constitutional:      Appearance: He is well-developed.  HENT:     Head: Normocephalic and atraumatic.     Right Ear: External ear normal.     Left Ear: External ear normal.     Nose: Nose normal.  Eyes:     Conjunctiva/sclera: Conjunctivae normal.  Cardiovascular:     Rate and  Rhythm: Normal rate.     Pulses: Normal pulses.     Heart sounds: No murmur heard.   Pulmonary:     Effort: Pulmonary effort is normal.  Abdominal:     General: There is no distension.     Palpations: Abdomen is soft.     Tenderness: There is no abdominal tenderness.  Musculoskeletal:        General: No deformity.     Cervical back: No rigidity.  Skin:    General: Skin is warm and dry.     Capillary Refill: Capillary refill takes less than 2 seconds.  Neurological:     Mental Status: He is alert.  Psychiatric:        Mood and Affect: Affect is labile and angry.        Speech: He is noncommunicative. Speech is rapid and pressured.        Behavior: Behavior is agitated and aggressive.        Thought Content: Thought content includes homicidal ideation.     Comments: On my initial interview patient repeatedly stands up to make threatening gestures towards this examiner and is somewhat difficult to redirect.      ____________________________________________   LABS (all labs ordered are listed, but only abnormal results are displayed)  Labs Reviewed  COMPREHENSIVE METABOLIC PANEL - Abnormal; Notable for the following components:      Result Value   Potassium 3.4 (*)    CO2 20 (*)    Total Protein 8.4 (*)    All other components within normal limits  ETHANOL - Abnormal; Notable for the following components:   Alcohol, Ethyl (B) 132 (*)    All other components within normal limits  SALICYLATE LEVEL - Abnormal; Notable for the following components:   Salicylate Lvl <7.0 (*)    All other components within normal limits  ACETAMINOPHEN LEVEL - Abnormal; Notable for the following components:   Acetaminophen (Tylenol), Serum <10 (*)    All other components within normal limits  CBC - Abnormal; Notable for the following components:   HCT 38.9 (*)    MCHC 36.8 (*)    All other components within normal limits  SARS CORONAVIRUS 2 BY RT PCR (HOSPITAL ORDER, PERFORMED IN [place]  [facility])  URINE DRUG SCREEN, QUALITATIVE ([facility] ONLY)   ____________________________________________ ____________________________________________   PROCEDURES  Procedure(s) performed (including Critical Care):  Procedures   ____________________________________________   INITIAL IMPRESSION / ASSESSMENT AND PLAN / ED COURSE  Patient presents with above-stated history exam with concerns for HI and possible psychosis.  Details as above.  Patient is afebrile hemodynamically stable on arrival.  Patient reportedly not taking his psychiatric medications on review of IVC paperwork filled out by PD.  He also reportedly has been hearing voices although he denied this to me.  Routine psych labs sent.  IVC paperwork completed by this examiner.  Psychiatry service and PT is consulted.  Patient given below noted medications for patient and staff safety given aggressive behavior on arrival.  The patient has been placed in psychiatric observation due to the need to provide a safe environment for the patient while obtaining psychiatric consultation and evaluation, as well as ongoing medical and medication management to treat the patient's condition.  The patient has been placed under full IVC at this time.    ____________________________________________   FINAL CLINICAL IMPRESSION(S) / ED DIAGNOSES  Final diagnoses:  Aggressive behavior  Blood alcohol level of 120-199 mg/100 ml    Medications  haloperidol lactate (HALDOL) injection 5 mg (5 mg Intramuscular Given 07/31/20 2308)  midazolam (VERSED) injection 2 mg (2 mg Intramuscular Given 07/31/20 2308)  diphenhydrAMINE (BENADRYL) injection 50 mg (50 mg Intramuscular Given 07/31/20 2308)     ED Discharge Orders    None       Note:  This document was prepared using Dragon voice recognition software and may include unintentional dictation errors.   Gilles Chiquito, MD 07/31/20 2330

## 2020-07-31 NOTE — ED Triage Notes (Signed)
Patient to ED with [facility] under IVC.  Patient states he is hearing voices and has not taken his psych meds in several months.

## 2020-07-31 NOTE — ED Notes (Signed)
Patient agitated, repeatedly yelling out and cursing despite repeated attempts at verbal de-escalation. MD informed. Medications ordered (see MAR); medications administered.

## 2020-08-01 LAB — URINE DRUG SCREEN, QUALITATIVE (ARMC ONLY)
Amphetamines, Ur Screen: NOT DETECTED
Barbiturates, Ur Screen: NOT DETECTED
Benzodiazepine, Ur Scrn: POSITIVE — AB
Cannabinoid 50 Ng, Ur ~~LOC~~: POSITIVE — AB
Cocaine Metabolite,Ur ~~LOC~~: POSITIVE — AB
MDMA (Ecstasy)Ur Screen: NOT DETECTED
Methadone Scn, Ur: NOT DETECTED
Opiate, Ur Screen: NOT DETECTED
Phencyclidine (PCP) Ur S: NOT DETECTED
Tricyclic, Ur Screen: NOT DETECTED

## 2020-08-01 LAB — SARS CORONAVIRUS 2 BY RT PCR (HOSPITAL ORDER, PERFORMED IN ~~LOC~~ HOSPITAL LAB): SARS Coronavirus 2: NEGATIVE

## 2020-08-01 MED ORDER — TRIHEXYPHENIDYL HCL 2 MG PO TABS
2.0000 mg | ORAL_TABLET | Freq: Two times a day (BID) | ORAL | Status: DC
  Administered 2020-08-01 – 2020-08-02 (×3): 2 mg via ORAL
  Filled 2020-08-01 (×4): qty 1

## 2020-08-01 MED ORDER — OLANZAPINE 10 MG PO TABS
10.0000 mg | ORAL_TABLET | Freq: Every day | ORAL | Status: DC
  Administered 2020-08-01: 10 mg via ORAL
  Filled 2020-08-01: qty 1

## 2020-08-01 MED ORDER — THIOTHIXENE 5 MG PO CAPS
5.0000 mg | ORAL_CAPSULE | Freq: Two times a day (BID) | ORAL | Status: DC
  Administered 2020-08-01 – 2020-08-02 (×3): 5 mg via ORAL
  Filled 2020-08-01 (×5): qty 1

## 2020-08-01 NOTE — ED Notes (Signed)
Hourly rounding reveals patient in room. No complaints, stable, in no acute distress. Q15 minute rounds and monitoring via Security Cameras to continue. 

## 2020-08-01 NOTE — ED Notes (Signed)
Patient's fiance [name] would like to speak with psychiatry about patient's behavior at home as well as his problems with medication (compliance and effectiveness).  [phone]

## 2020-08-01 NOTE — Consult Note (Signed)
[facility]   Reason for Consult:  Off meds OOC psychotic behaviors --unclear safety margin Referring Physician:  ED MD  Patient Identification: Dominic [name] MRN:  [id number] Principal Diagnosis: <principal problem not specified> Diagnosis:  Active Problems:   * No active hospital problems. * Schizoaffective Disorder ETOH intoxication   Total Time spent with patient:  30 min or so  Subjective:   Dominic [name] is a 44 y.o. male patient admitted with  Worsening agitation, speeded behaviors, paranoia illogical thought hearing voices overall bizarre strangeness---  BAL at 132  And also his UDS is positive for cocaine  benzos and Marijuana        HPI:   As above -  Past Psychiatric History:  Several admits before came in for similar issues   Risk to Self:   not clear possible  Risk to Others:  family is fearful of his actions and behaviors  Prior Inpatient Therapy:   already known  Prior Outpatient Therapy:   no regular follow up   Past Medical History:  Past Medical History:  Diagnosis Date  . Anxiety   . Asthma   . Bipolar 1 disorder (HCC)   . Chronic hepatitis C without hepatic coma (HCC) 05/28/2018  . Depression   . Hepatitis C   . Hepatitis C antibody positive in blood 05/28/2018  . Schizophrenia (HCC)   . Sleep apnea     Past Surgical History:  Procedure Laterality Date  . CYST REMOVAL NECK     neck   Family History:  Family History  Problem Relation Age of Onset  . Hypertension Sister   . Schizophrenia Sister    Family Psychiatric  History:    Parents with past history of depression and anxiety  Social History:  Social History   Substance and Sexual Activity  Alcohol Use Yes  . Alcohol/week: 3.0 standard drinks  . Types: 3 Shots of liquor per week     Social History   Substance and Sexual Activity  Drug Use Yes  . Types: Cocaine, Marijuana   Comment: last used cocaine Thursday and MJ use daily    Social History    Socioeconomic History  . Marital status: Single    Spouse name: Not on file  . Number of children: Not on file  . Years of education: Not on file  . Highest education level: Not on file  Occupational History  . Not on file  Tobacco Use  . Smoking status: Current Some Day Smoker    Packs/day: 1.00    Types: Cigarettes  . Smokeless tobacco: Never Used  Vaping Use  . Vaping Use: Never used  Substance and Sexual Activity  . Alcohol use: Yes    Alcohol/week: 3.0 standard drinks    Types: 3 Shots of liquor per week  . Drug use: Yes    Types: Cocaine, Marijuana    Comment: last used cocaine Thursday and MJ use daily  . Sexual activity: Not Currently  Other Topics Concern  . Not on file  Social History Narrative  . Not on file   Social Determinants of Health   Financial Resource Strain:   . Difficulty of Paying Living Expenses: Not on file  Food Insecurity:   . Worried About Programme researcher, broadcasting/film/videounning Out of Food in the Last Year: Not on file  . Ran Out of Food in the Last Year: Not on file  Transportation Needs:   . Lack of Transportation (Medical): Not on file  .  Lack of Transportation (Non-Medical): Not on file  Physical Activity:   . Days of Exercise per Week: Not on file  . Minutes of Exercise per Session: Not on file  Stress:   . Feeling of Stress : Not on file  Social Connections:   . Frequency of Communication with Friends and Family: Not on file  . Frequency of Social Gatherings with Friends and Family: Not on file  . Attends Religious Services: Not on file  . Active Member of Clubs or Organizations: Not on file  . Attends Banker Meetings: Not on file  . Marital Status: Not on file   Additional Social History:    Allergies:  No Known Allergies  Labs:  Results for orders placed or performed during the hospital encounter of 07/31/20 (from the past 48 hour(s))  Comprehensive metabolic panel     Status: Abnormal   Collection Time: 07/31/20 10:31 PM  Result  Value Ref Range   Sodium 137 135 - 145 mmol/L   Potassium 3.4 (L) 3.5 - 5.1 mmol/L   Chloride 107 98 - 111 mmol/L   CO2 20 (L) 22 - 32 mmol/L   Glucose, Bld 82 70 - 99 mg/dL    Comment: Glucose reference range applies only to samples taken after fasting for at least 8 hours.   BUN 12 6 - 20 mg/dL   Creatinine, Ser 3.78 0.61 - 1.24 mg/dL   Calcium 8.9 8.9 - 58.8 mg/dL   Total Protein 8.4 (H) 6.5 - 8.1 g/dL   Albumin 4.3 3.5 - 5.0 g/dL   AST 37 15 - 41 U/L   ALT 33 0 - 44 U/L   Alkaline Phosphatase 45 38 - 126 U/L   Total Bilirubin 0.7 0.3 - 1.2 mg/dL   GFR calc non Af Amer >60 >60 mL/min   GFR calc Af Amer >60 >60 mL/min   Anion gap 10 5 - 15    Comment: Performed at [facility], [address] [place], [place] [address]  Ethanol     Status: Abnormal   Collection Time: 07/31/20 10:31 PM  Result Value Ref Range   Alcohol, Ethyl (B) 132 (H) <10 mg/dL    Comment: (NOTE) Lowest detectable limit for serum alcohol is 10 mg/dL.  For medical purposes only. Performed at [facility], [address] [place], [place] [address]   Salicylate level     Status: Abnormal   Collection Time: 07/31/20 10:31 PM  Result Value Ref Range   Salicylate Lvl <7.0 (L) 7.0 - 30.0 mg/dL    Comment: Performed at [facility], [address] [place], [place] [address]  Acetaminophen level     Status: Abnormal   Collection Time: 07/31/20 10:31 PM  Result Value Ref Range   Acetaminophen (Tylenol), Serum <10 (L) 10 - 30 ug/mL    Comment: (NOTE) Therapeutic concentrations vary significantly. A range of 10-30 ug/mL  may be an effective concentration for many patients. However, some  are best treated at concentrations outside of this range. Acetaminophen concentrations >150 ug/mL at 4 hours after ingestion  and >50 ug/mL at 12 hours after ingestion are often associated with  toxic reactions.  Performed at [facility], [address] [place], [place] [address]    cbc     Status: Abnormal   Collection Time: 07/31/20 10:31 PM  Result Value Ref Range   WBC 10.3 4.0 - 10.5 K/uL   RBC 4.84 4.22 - 5.81 MIL/uL   Hemoglobin  14.3 13.0 - 17.0 g/dL   HCT 56.3 (L) 39 - 52 %   MCV 80.4 80.0 - 100.0 fL   MCH 29.5 26.0 - 34.0 pg   MCHC 36.8 (H) 30.0 - 36.0 g/dL   RDW 87.5 64.3 - 32.9 %   Platelets 182 150 - 400 K/uL   nRBC 0.0 0.0 - 0.2 %    Comment: Performed at [facility], [address] [place], [place] [address]  SARS Coronavirus 2 by RT PCR (hospital order, performed in [facility] hospital lab) Nasopharyngeal Nasopharyngeal Swab     Status: None   Collection Time: 07/31/20 11:25 PM   Specimen: Nasopharyngeal Swab  Result Value Ref Range   SARS Coronavirus 2 NEGATIVE NEGATIVE    Comment: (NOTE) SARS-CoV-2 target nucleic acids are NOT DETECTED.  The SARS-CoV-2 RNA is generally detectable in upper and lower respiratory specimens during the acute phase of infection. The lowest concentration of SARS-CoV-2 viral copies this assay can detect is 250 copies / mL. A negative result does not preclude SARS-CoV-2 infection and should not be used as the sole basis for treatment or other patient management decisions.  A negative result may occur with improper specimen collection / handling, submission of specimen other than nasopharyngeal swab, presence of viral mutation(s) within the areas targeted by this assay, and inadequate number of viral copies (<250 copies / mL). A negative result must be combined with clinical observations, patient history, and epidemiological information.  Fact Sheet for Patients:   BoilerBrush.com.cy  Fact Sheet for Healthcare Providers: https://pope.com/  This test is not yet approved or  cleared by the [place] FDA and has been authorized for detection and/or diagnosis of SARS-CoV-2 by FDA under an Emergency Use Authorization (EUA).  This EUA will remain in  effect (meaning this test can be used) for the duration of the COVID-19 declaration under Section 564(b)(1) of the Act, 21 U.S.C. section 360bbb-3(b)(1), unless the authorization is terminated or revoked sooner.  Performed at [facility], [address] [place], [place] [address]   Urine Drug Screen, Qualitative     Status: Abnormal   Collection Time: 08/01/20  9:11 AM  Result Value Ref Range   Tricyclic, Ur Screen NONE DETECTED NONE DETECTED   Amphetamines, Ur Screen NONE DETECTED NONE DETECTED   MDMA (Ecstasy)Ur Screen NONE DETECTED NONE DETECTED   Cocaine Metabolite,Ur [place] POSITIVE (A) NONE DETECTED   Opiate, Ur Screen NONE DETECTED NONE DETECTED   Phencyclidine (PCP) Ur S NONE DETECTED NONE DETECTED   Cannabinoid 50 Ng, Ur [place] POSITIVE (A) NONE DETECTED   Barbiturates, Ur Screen NONE DETECTED NONE DETECTED   Benzodiazepine, Ur Scrn POSITIVE (A) NONE DETECTED   Methadone Scn, Ur NONE DETECTED NONE DETECTED    Comment: (NOTE) Tricyclics + metabolites, urine    Cutoff 1000 ng/mL Amphetamines + metabolites, urine  Cutoff 1000 ng/mL MDMA (Ecstasy), urine              Cutoff 500 ng/mL Cocaine Metabolite, urine          Cutoff 300 ng/mL Opiate + metabolites, urine        Cutoff 300 ng/mL Phencyclidine (PCP), urine         Cutoff 25 ng/mL Cannabinoid, urine                 Cutoff 50 ng/mL Barbiturates + metabolites, urine  Cutoff 200 ng/mL Benzodiazepine, urine              Cutoff  200 ng/mL Methadone, urine                   Cutoff 300 ng/mL  The urine drug screen provides only a preliminary, unconfirmed analytical test result and should not be used for non-medical purposes. Clinical consideration and professional judgment should be applied to any positive drug screen result due to possible interfering substances. A more specific alternate chemical method must be used in order to obtain a confirmed analytical result. Gas chromatography / mass spectrometry (GC/MS) is the  preferred confirm atory method. Performed at [facility], [address] [place], [place] [address]     No current facility-administered medications for this encounter.   Current Outpatient Medications  Medication Sig Dispense Refill  . ARIPiprazole Lauroxil (ARISTADA IM) Inject 1 mL/L into the muscle every 30 (thirty) days.    . traZODone (DESYREL) 100 MG tablet Take 1 tablet (100 mg total) by mouth at bedtime. 30 tablet 0    Musculoskeletal: Strength & Muscle Tone: generally normal   Gait & Station:  Reportedly slower now but he is lying down  Patient leans:  N/a   Psychiatric Specialty Exam: Physical Exam  Review of Systems  Blood pressure 125/79, pulse 72, temperature 98 F (36.7 C), temperature source Oral, resp. rate 19, height 5\' 11"  (1.803 m), weight 90.7 kg, SpO2 98 %.Body mass index is 27.89 kg/m.   Sleep  Erratic interrupted ADL's impaired when psychotic or intoxicated Assets caring family  Aims not done yet Language english Recall poor Cognition poor Akathisia none Psych activity ---sleeping for now    Mental Status  Most limited since he is not cooperative and is sleeping Rapport and eye contact poor Answers to name Safety margin not clear     Treatment Plan Summary:  AA male who comes when he is not taking medications.  Family generally inisists on longer admissions that we cannot do but his disposition is to observe overnight on regular meds I started today   Disposition:   Observe overnight for now  Not clear yet if he willl be admitted  , MD 08/01/2020 1:43 PM

## 2020-08-01 NOTE — BH Assessment (Signed)
Patient currently unable to participate in assessment at this time, will attempt at a later time.

## 2020-08-01 NOTE — ED Notes (Signed)
Got pt sandwich tray and drink

## 2020-08-01 NOTE — ED Notes (Signed)
Pt. Transferred to [facility] from ED to room 2 after screening for contraband. Report to include Situation, Background, Assessment and Recommendations from Kim RN. Pt. Oriented to unit including Q15 minute rounds as well as the security cameras for their protection. Patient is alert and oriented, warm and dry in no acute distress. Patient denies SI, HI, and AVH. Pt. Encouraged to let me know if needs arise.  

## 2020-08-01 NOTE — ED Notes (Signed)
Gave pt dinner tray  

## 2020-08-01 NOTE — BH Assessment (Signed)
Assessment Note  Dominic [name] is an 44 y.o. male presenting to [facility] ED under IVC. Per triage note Patient to ED with [facility] under IVC.  Patient states he is hearing voices and has not taken his psych meds in several months. During assessment patient appears alert and oriented x2, calm and cooperative. When asked if patient understands why he is in the ED he reported "I don't remember, all I know is that I've been off my meds since last time I was here." Patient was able to report that he has current outpatient treatment with [facility]. He was also able to report that he has a substance use history and reported using "all drugs." Patient UDS is positive for Opiates, Cannabinoids, Benzodiazepines and his BAL is 132. During assessment patient is withdrawn and irritable and didn't provide much information. Patient does have a previous hospitalization with [facility] in 07/15/2019 and 05/22/2019 and has been diagnosed with Schizophrenia. Patient was able to deny SI but reports having fleeting thoughts, he denies HI/VH and reports AH. Patient does not appear to be responding to any internal or external stimuli.  Per Psyc MD Dr. Smith Robert patient to be observed overnight and reassessed  Diagnosis: Polysubstance Abuse, Schizophrenia   Past Medical History:  Past Medical History:  Diagnosis Date   Anxiety    Asthma    Bipolar 1 disorder (HCC)    Chronic hepatitis C without hepatic coma (HCC) 05/28/2018   Depression    Hepatitis C    Hepatitis C antibody positive in blood 05/28/2018   Schizophrenia (HCC)    Sleep apnea     Past Surgical History:  Procedure Laterality Date   CYST REMOVAL NECK     neck    Family History:  Family History  Problem Relation Age of Onset   Hypertension Sister    Schizophrenia Sister     Social History:  reports that he has been smoking cigarettes. He has been smoking about 1.00 pack per day. He has never used smokeless tobacco. He reports current alcohol use  of about 3.0 standard drinks of alcohol per week. He reports current drug use. Drugs: Cocaine and Marijuana.  Additional Social History:  Alcohol / Drug Use Pain Medications: See MAR Prescriptions: See MAR Over the Counter: See MAR History of alcohol / drug use?: Yes Substance #1 Name of Substance 1: Benzodiazapines Substance #2 Name of Substance 2: Opioids Substance #3 Name of Substance 3: Marijuana Substance #4 Name of Substance 4: Alcohol  CIWA: CIWA-Ar BP: 125/79 Pulse Rate: 72 COWS:    Allergies: No Known Allergies  Home Medications: (Not in a hospital admission)   OB/GYN Status:  No LMP for male patient.  General Assessment Data Location of Assessment: [facility] ED TTS Assessment: In system Is this a Tele or Face-to-Face Assessment?: Face-to-Face Is this an Initial Assessment or a Re-assessment for this encounter?: Initial Assessment Patient Accompanied by:: N/A Language Other than English: No Living Arrangements: Other (Comment) What gender do you identify as?: Male Marital status: Single Pregnancy Status: No Living Arrangements: Other relatives, Parent Can pt return to current living arrangement?: Yes Admission Status: Involuntary Petitioner: Police Is patient capable of signing voluntary admission?: No Referral Source: Other Insurance type: Medicaid  Medical Screening Exam [facility] Walk-in ONLY) Medical Exam completed: Yes  Crisis Care Plan Living Arrangements: Other relatives, Parent Legal Guardian: Other: (Self) Name of Psychiatrist: None Name of Therapist: None  Education Status Is patient currently in school?: No Is the patient employed, unemployed  or receiving disability?: Receiving disability income  Risk to self with the past 6 months Suicidal Ideation: No-Not Currently/Within Last 6 Months Has patient been a risk to self within the past 6 months prior to admission? : No Suicidal Intent: No-Not Currently/Within Last 6 Months Has patient had any  suicidal intent within the past 6 months prior to admission? : No Is patient at risk for suicide?: No Suicidal Plan?: No Has patient had any suicidal plan within the past 6 months prior to admission? : No Access to Means: No What has been your use of drugs/alcohol within the last 12 months?: Benzos, Marijuana, Opioids, Cocaine, Alcohol Previous Attempts/Gestures: No How many times?: 0 Other Self Harm Risks: None Triggers for Past Attempts: None known Intentional Self Injurious Behavior: None Family Suicide History: Unknown Recent stressful life event(s): Other (Comment) (None reported) Persecutory voices/beliefs?: Yes Depression: Yes Depression Symptoms: Loss of interest in usual pleasures, Feeling angry/irritable Substance abuse history and/or treatment for substance abuse?: Yes Suicide prevention information given to non-admitted patients: Not applicable  Risk to Others within the past 6 months Homicidal Ideation: No Does patient have any lifetime risk of violence toward others beyond the six months prior to admission? : No Thoughts of Harm to Others: No Current Homicidal Intent: No Current Homicidal Plan: No Access to Homicidal Means: No Identified Victim: None History of harm to others?: No Assessment of Violence: None Noted Violent Behavior Description: None Does patient have access to weapons?: No Criminal Charges Pending?: No Does patient have a court date: No Is patient on probation?: No  Psychosis Hallucinations: Auditory Delusions: None noted  Mental Status Report Appearance/Hygiene: In scrubs Eye Contact: Poor Motor Activity: Freedom of movement Speech: Logical/coherent Level of Consciousness: Alert Mood: Irritable Affect: Appropriate to circumstance Anxiety Level: None Thought Processes: Coherent Judgement: Partial Orientation: Person, Place Obsessive Compulsive Thoughts/Behaviors: None  Cognitive Functioning Concentration: Normal Memory: Remote  Intact, Recent Impaired Is patient IDD: No Insight: Poor Impulse Control: Poor Appetite: Fair Have you had any weight changes? : No Change Sleep: Decreased Total Hours of Sleep: 0 Vegetative Symptoms: None  ADLScreening [facility] Assessment Services) Patient's cognitive ability adequate to safely complete daily activities?: Yes Patient able to express need for assistance with ADLs?: Yes Independently performs ADLs?: Yes (appropriate for developmental age)  Prior Inpatient Therapy Prior Inpatient Therapy: Yes Prior Therapy Dates: 05/2019,07/2019 Prior Therapy Facilty/Provider(s): [facility] Reason for Treatment: Schizophrenia, SI, Substance Abuse  Prior Outpatient Therapy Prior Outpatient Therapy: No Does patient have an ACCT team?: No Does patient have Intensive In-House Services?  : No Does patient have [facility] services? : No Does patient have P4CC services?: No  ADL Screening (condition at time of admission) Patient's cognitive ability adequate to safely complete daily activities?: Yes Is the patient deaf or have difficulty hearing?: No Does the patient have difficulty seeing, even when wearing glasses/contacts?: No Does the patient have difficulty concentrating, remembering, or making decisions?: No Patient able to express need for assistance with ADLs?: Yes Does the patient have difficulty dressing or bathing?: No Independently performs ADLs?: Yes (appropriate for developmental age) Does the patient have difficulty walking or climbing stairs?: No Weakness of Legs: None Weakness of Arms/Hands: None  Home Assistive Devices/Equipment Home Assistive Devices/Equipment: None  Therapy Consults (therapy consults require a physician order) PT Evaluation Needed: No OT Evalulation Needed: No SLP Evaluation Needed: No Abuse/Neglect Assessment (Assessment to be complete while patient is alone) Abuse/Neglect Assessment Can Be Completed: Yes Physical Abuse: Denies Verbal Abuse:  Denies Sexual  Abuse: Denies Exploitation of patient/patient's resources: Denies Self-Neglect: Denies Values / Beliefs Cultural Requests During Hospitalization: None Spiritual Requests During Hospitalization: None Consults Spiritual Care Consult Needed: No Transition of Care Team Consult Needed: No Advance Directives (For Healthcare) Does Patient Have a Medical Advance Directive?: No          Disposition: Per Psyc MD Dr. Smith Robert patient to be observed overnight and reassessed Disposition Initial Assessment Completed for this Encounter: Yes  On Site Evaluation by:   Reviewed with Physician:    Benay Pike MS LCASA 08/01/2020 9:49 PM

## 2020-08-01 NOTE — ED Notes (Signed)
Pt's sister and mother updated with pt permission.

## 2020-08-02 MED ORDER — TRAZODONE HCL 100 MG PO TABS: 100.0000 mg | ORAL_TABLET | Freq: Every day | ORAL | Status: DC

## 2020-08-02 NOTE — ED Provider Notes (Signed)
Emergency Medicine Observation Re-evaluation Note  Dominic [name] is a 44 y.o. male, seen on rounds today.  Pt initially presented to the ED for complaints of Psychiatric Evaluation Currently, the patient is calm, no complaints.  Physical Exam  BP 122/68 (BP Location: Right Arm)   Pulse (!) 56   Temp 97.9 F (36.6 C) (Oral)   Resp 18   Ht 5\' 11"  (1.803 m)   Wt 90.7 kg   SpO2 96%   BMI 27.89 kg/m  Physical Exam General: nad Cardiac: rrr Lungs: ctab Psych: not agitated  ED Course / MDM  EKG:    I have reviewed the labs performed to date as well as medications administered while in observation.  Recent changes in the last 24 hours include seen by psychiatry, antipsychotics restarted.  Plan  Current plan is for psychiatry reassessment. Patient is under full IVC at this time.   , MD 08/02/20 [phone]

## 2020-08-02 NOTE — ED Notes (Signed)
Hourly rounding reveals patient in room. No complaints, stable, in no acute distress. Q15 minute rounds and monitoring via Security Cameras to continue. 

## 2020-08-02 NOTE — Final Progress Note (Signed)
Physician Final Progress Note  Patient ID: Dominic [name] MRN: [id number] DOB/AGE: 43/43/1977 43 y.o.  Admit date: 07/31/2020 Admitting provider: No admitting provider for patient encounter. 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 *    He has come down from effects of drugs in his usual pattern Mom in her usual enabling pattern will allow him to come home despite his drugs and lack of motivation for treatment   Alert cooperative oriented times four Rapport and eye contact fair Has underlying manipulative energy No shakes tremors tics  No ocd Traits Thought process and content --no frank psychosis or mania No active SI HI or plans  Judgement insight reliability intelligence and fund of knowledge all poor and below average Consciousness not clouded or fluctuant Abstraction fair  Speech normal  Thought process and content --superficial     Musculoskeletal normal Gait and station normal Leans not known  Handedness not known Akathisia none Recall poor Cognition limited Assets not k nown  Psychomotor more normal Language normal English     Discharge Meds  Navane 10 mg po qhs  Zyprexa 10 mg po qhs  Artane 2mg  po bid  Cymbalta 30 mg daily   #21 each   Needed at discharge          Consults:  TTS  ER MD  [facility] MD   Significant Findings/ Diagnostic Studies: { general labs  Procedures:     Routine ER   Discharge Condition: fair  Disposition:   Diet: Regular diet  Discharge Activity: Activity as tolerated      Total time spent taking care of this patient: 30-40 minutes  Signed: [facility] 08/02/2020, 5:00 PM

## 2020-08-02 NOTE — ED Notes (Signed)
Patient voices understanding of discharge instructions, all belongings given back to Patient, bus ticket given to  Patient, Patient without signs of distress.

## 2020-08-02 NOTE — ED Notes (Signed)
Asked patient if he wanted a shower ,patient just stared and walked away

## 2020-08-02 NOTE — ED Notes (Signed)
Patient ate 100% of His lunch, He is cooperative, just always ask when He can leave, nurse let him know that she would keep him updated with any changes, Patient brought His trash out and emptied in big trash can, will continue to monitor.

## 2020-08-02 NOTE — ED Notes (Signed)
Patient talked on the phone and does not have a ride to leave, but needs a bus ticket, patient did eat all of His supper prior to His discharge. Patient is calm now that He knows that He will be discharged.

## 2020-08-13 ENCOUNTER — Emergency Department
Admission: EM | Admit: 2020-08-13 | Discharge: 2020-08-14 | Disposition: A | Discharge status: Home / Self Care | Payer: Medicaid Other | Attending: Emergency Medicine | Admitting: Emergency Medicine

## 2020-08-13 ENCOUNTER — Emergency Department: Payer: Medicaid Other

## 2020-08-13 ENCOUNTER — Other Ambulatory Visit: Payer: Self-pay

## 2020-08-13 ENCOUNTER — Encounter: Payer: Self-pay | Admitting: Emergency Medicine

## 2020-08-13 ENCOUNTER — Emergency Department
Admission: EM | Admit: 2020-08-13 | Discharge: 2020-08-13 | Disposition: A | Discharge status: Home / Self Care | Payer: Medicaid Other | Attending: Emergency Medicine | Admitting: Emergency Medicine

## 2020-08-13 DIAGNOSIS — S82002A Unspecified fracture of left patella, initial encounter for closed fracture: Secondary | ICD-10-CM | POA: Diagnosis not present

## 2020-08-13 DIAGNOSIS — Z5321 Procedure and treatment not carried out due to patient leaving prior to being seen by health care provider: Secondary | ICD-10-CM | POA: Diagnosis not present

## 2020-08-13 DIAGNOSIS — S80912A Unspecified superficial injury of left knee, initial encounter: Secondary | ICD-10-CM | POA: Diagnosis present

## 2020-08-13 DIAGNOSIS — S80212A Abrasion, left knee, initial encounter: Secondary | ICD-10-CM | POA: Insufficient documentation

## 2020-08-13 DIAGNOSIS — F17210 Nicotine dependence, cigarettes, uncomplicated: Secondary | ICD-10-CM | POA: Diagnosis not present

## 2020-08-13 DIAGNOSIS — M25462 Effusion, left knee: Secondary | ICD-10-CM | POA: Insufficient documentation

## 2020-08-13 DIAGNOSIS — W098XXA Fall on or from other playground equipment, initial encounter: Secondary | ICD-10-CM | POA: Diagnosis not present

## 2020-08-13 DIAGNOSIS — Y9344 Activity, trampolining: Secondary | ICD-10-CM | POA: Insufficient documentation

## 2020-08-13 DIAGNOSIS — X58XXXA Exposure to other specified factors, initial encounter: Secondary | ICD-10-CM | POA: Insufficient documentation

## 2020-08-13 DIAGNOSIS — J45909 Unspecified asthma, uncomplicated: Secondary | ICD-10-CM | POA: Insufficient documentation

## 2020-08-13 DIAGNOSIS — S8992XA Unspecified injury of left lower leg, initial encounter: Secondary | ICD-10-CM | POA: Diagnosis present

## 2020-08-13 MED ORDER — IBUPROFEN 600 MG PO TABS
600.0000 mg | ORAL_TABLET | Freq: Once | ORAL | Status: AC
  Administered 2020-08-13: 600 mg via ORAL
  Filled 2020-08-13: qty 1

## 2020-08-13 NOTE — ED Triage Notes (Signed)
Pt to ED with complaint of  L knee pain. Pt states he injured it while on a trampoline with his daughter.   Abrasion and swelling noted to L knee at this time.

## 2020-08-13 NOTE — ED Provider Notes (Signed)
[facility] Emergency Department Provider Note  ____________________________________________   First MD Initiated Contact with Patient 08/13/20 2302     (approximate)  I have reviewed the triage vital signs and the nursing notes.   HISTORY  Chief Complaint Knee Pain    HPI Dominic [name] is a 44 y.o. male presents emergency department complaining of left knee pain for several days.  States he was bounced off a trampoline with his kids and fell.  Patient states he is very tired as he had just come from work at [employer].  States he was trying to work and could not stand due to the amount of pain.  Patient denies any alcohol or drug use.    Past Medical History:  Diagnosis Date  . Anxiety   . Asthma   . Bipolar 1 disorder (HCC)   . Chronic hepatitis C without hepatic coma (HCC) 05/28/2018  . Depression   . Hepatitis C   . Hepatitis C antibody positive in blood 05/28/2018  . Schizophrenia (HCC)   . Sleep apnea     Patient Active Problem List   Diagnosis Date Noted  . Schizophrenia (HCC) 05/22/2019  . Schizophrenia, chronic condition (HCC) 01/14/2019  . Alcohol intoxication, uncomplicated (HCC) 01/13/2019  . Asthma 11/25/2018  . Nicotine dependence, cigarettes, uncomplicated 11/25/2018  . Chronic hepatitis C without hepatic coma (HCC) 05/28/2018  . Psychosis (HCC) 02/16/2016  . Alcohol abuse 02/16/2016  . Cocaine abuse (HCC) 02/16/2016    Past Surgical History:  Procedure Laterality Date  . CYST REMOVAL NECK     neck    Prior to Admission medications   Medication Sig Start Date End Date Taking? Authorizing Provider  ARIPiprazole Lauroxil (ARISTADA IM) Inject 1 mL/L into the muscle every 30 (thirty) days. Patient not taking: Reported on 08/02/2020    [provider]  traZODone (DESYREL) 100 MG tablet Take 1 tablet (100 mg total) by mouth at bedtime. Patient not taking: Reported on 08/02/2020 05/21/20   Minna Antis, MD     Allergies Patient has no known allergies.  Family History  Problem Relation Age of Onset  . Hypertension Sister   . Schizophrenia Sister     Social History Social History   Tobacco Use  . Smoking status: Current Some Day Smoker    Packs/day: 1.00    Types: Cigarettes  . Smokeless tobacco: Never Used  Vaping Use  . Vaping Use: Never used  Substance Use Topics  . Alcohol use: Yes    Alcohol/week: 3.0 standard drinks    Types: 3 Shots of liquor per week  . Drug use: Yes    Types: Cocaine, Marijuana    Comment: last used cocaine Thursday and MJ use daily    Review of Systems  Constitutional: No fever/chills Eyes: No visual changes. ENT: No sore throat. Respiratory: Denies cough Genitourinary: Negative for dysuria. Musculoskeletal: Negative for back pain.  Positive for left knee pain Skin: Negative for rash. Psychiatric: no mood changes,     ____________________________________________   PHYSICAL EXAM:  VITAL SIGNS: ED Triage Vitals [08/13/20 2236]  Enc Vitals Group     BP 140/76     Pulse Rate 81     Resp 16     Temp 98.1 F (36.7 C)     Temp Source Oral     SpO2 98 %     Weight 198 lb 6.6 oz (90 kg)     Height 5\' 11"  (1.803 m)     Head  Circumference      Peak Flow      Pain Score 10     Pain Loc      Pain Edu?      Excl. in GC?     Constitutional: Alert and oriented. Well appearing and in no acute distress. Eyes: Conjunctivae are normal.  Head: Atraumatic. Nose: No congestion/rhinnorhea. Mouth/Throat: Mucous membranes are moist.  Neck:  supple no lymphadenopathy noted Cardiovascular: Normal rate, regular rhythm.  Respiratory: Normal respiratory effort.  No retractions GU: deferred Musculoskeletal: left knee is tender and swollen, pain reproduced with rom, n/v intact Neurologic:  Normal speech and language.  Skin:  Skin is warm, dry and intact. No rash noted. Psychiatric: Mood and affect are normal. Speech and behavior are  normal.  ____________________________________________   LABS (all labs ordered are listed, but only abnormal results are displayed)  Labs Reviewed - No data to display ____________________________________________   ____________________________________________  RADIOLOGY  Xray left knee shows knee infusion and ?patella fracture  ____________________________________________   PROCEDURES  Procedure(s) performed: knee immobilizer and crutches  Procedures    ____________________________________________   INITIAL IMPRESSION / ASSESSMENT AND PLAN / ED COURSE  Pertinent labs & imaging results that were available during my care of the patient were reviewed by me and considered in my medical decision making (see chart for details).   Patient is a 44 year old male presents emergency department left knee pain.  See HPI.  Physical exam shows left knee to be tender and swollen.  Range of motion reproduces pain.  Neurovascularly intact.  X-ray of the left knee does show knee effusion and possible patella fracture.  Patient was placed in a knee immobilizer and given crutches.  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  He was given a work note stating my concerns.  He does take over-the-counter ibuprofen for pain as needed.  Apply ice.  Return if worsening.  Follow-up with orthopedics.  Discharged stable condition.  Patient is much more alert at discharge.  He has had crackers and soda.  Dominic [name] was evaluated in Emergency Department on 08/13/2020 for the symptoms described in the history of present illness. He was evaluated in the context of the global COVID-19 pandemic, which necessitated consideration that the patient might be at risk for infection with the SARS-CoV-2 virus that causes COVID-19. Institutional protocols and algorithms that pertain to the evaluation of patients at risk for COVID-19 are in a state of rapid change based on  information released by regulatory bodies including the CDC and federal and state organizations. These policies and algorithms were followed during the patient's care in the ED.    As part of my medical decision making, I reviewed the following data within the electronic MEDICAL RECORD NUMBER Nursing notes reviewed and incorporated, Old chart reviewed, Radiograph reviewed , Notes from prior ED visits and [place] Controlled Substance Database  ____________________________________________   FINAL CLINICAL IMPRESSION(S) / ED DIAGNOSES  Final diagnoses:  Closed nondisplaced fracture of left patella, unspecified fracture morphology, initial encounter  Effusion of left knee      NEW MEDICATIONS STARTED DURING THIS VISIT:  New Prescriptions   No medications on file     Note:  This document was prepared using Dragon voice recognition software and may include unintentional dictation errors.    Faythe Ghee, PA-C 08/13/20 2356    Sharman Cheek, MD 08/14/20 [phone]

## 2020-08-13 NOTE — ED Notes (Signed)
Called pt x 1

## 2020-08-13 NOTE — Discharge Instructions (Signed)
[facility].  Please call for an appointment.  Return emergency department worsening.  Take over-the-counter ibuprofen for pain as needed.  Apply ice to your knee.  Use the crutches if you have pain when bearing weight

## 2020-08-13 NOTE — ED Notes (Signed)
Pt arrives via EMS reports was at [place] Pt fell off a trampoline and hurt left knee

## 2020-08-13 NOTE — ED Triage Notes (Signed)
Pt complains of left knee pain for several days "I don't know how long" since bouncing on a trampoline. Pt falling asleep during triage.
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
## 2020-08-24 ENCOUNTER — Encounter: Payer: Self-pay | Admitting: Medical Oncology

## 2020-08-24 ENCOUNTER — Emergency Department
Admission: EM | Admit: 2020-08-24 | Discharge: 2020-08-25 | Disposition: A | Discharge status: Home / Self Care | Payer: No Typology Code available for payment source | Attending: Emergency Medicine | Admitting: Emergency Medicine

## 2020-08-24 ENCOUNTER — Other Ambulatory Visit: Payer: Self-pay

## 2020-08-24 DIAGNOSIS — J45909 Unspecified asthma, uncomplicated: Secondary | ICD-10-CM | POA: Diagnosis not present

## 2020-08-24 DIAGNOSIS — F17210 Nicotine dependence, cigarettes, uncomplicated: Secondary | ICD-10-CM | POA: Diagnosis not present

## 2020-08-24 DIAGNOSIS — F1910 Other psychoactive substance abuse, uncomplicated: Secondary | ICD-10-CM | POA: Insufficient documentation

## 2020-08-24 DIAGNOSIS — F209 Schizophrenia, unspecified: Secondary | ICD-10-CM | POA: Diagnosis not present

## 2020-08-24 DIAGNOSIS — F1410 Cocaine abuse, uncomplicated: Secondary | ICD-10-CM

## 2020-08-24 DIAGNOSIS — R4689 Other symptoms and signs involving appearance and behavior: Secondary | ICD-10-CM

## 2020-08-24 DIAGNOSIS — Z20822 Contact with and (suspected) exposure to covid-19: Secondary | ICD-10-CM | POA: Diagnosis not present

## 2020-08-24 LAB — URINE DRUG SCREEN, QUALITATIVE (ARMC ONLY)
Amphetamines, Ur Screen: NOT DETECTED
Barbiturates, Ur Screen: NOT DETECTED
Benzodiazepine, Ur Scrn: NOT DETECTED
Cannabinoid 50 Ng, Ur ~~LOC~~: POSITIVE — AB
Cocaine Metabolite,Ur ~~LOC~~: POSITIVE — AB
MDMA (Ecstasy)Ur Screen: NOT DETECTED
Methadone Scn, Ur: NOT DETECTED
Opiate, Ur Screen: NOT DETECTED
Phencyclidine (PCP) Ur S: NOT DETECTED
Tricyclic, Ur Screen: NOT DETECTED

## 2020-08-24 LAB — COMPREHENSIVE METABOLIC PANEL
ALT: 37 U/L (ref 0–44)
AST: 39 U/L (ref 15–41)
Albumin: 4.3 g/dL (ref 3.5–5.0)
Alkaline Phosphatase: 40 U/L (ref 38–126)
Anion gap: 7 (ref 5–15)
BUN: 14 mg/dL (ref 6–20)
CO2: 27 mmol/L (ref 22–32)
Calcium: 9.3 mg/dL (ref 8.9–10.3)
Chloride: 103 mmol/L (ref 98–111)
Creatinine, Ser: 0.89 mg/dL (ref 0.61–1.24)
GFR, Estimated: >60 mL/min (ref >60)
Glucose, Bld: 95 mg/dL (ref 70–99)
Potassium: 3.9 mmol/L (ref 3.5–5.1)
Sodium: 137 mmol/L (ref 135–145)
Total Bilirubin: 1 mg/dL (ref 0.3–1.2)
Total Protein: 8 g/dL (ref 6.5–8.1)

## 2020-08-24 LAB — ACETAMINOPHEN LEVEL: Acetaminophen (Tylenol), Serum: <10 ug/mL — ABNORMAL LOW (ref 10–30)

## 2020-08-24 LAB — GLUCOSE, CAPILLARY: Glucose-Capillary: 137 mg/dL — ABNORMAL HIGH (ref 70–99)

## 2020-08-24 LAB — CBC
HCT: 39.4 % (ref 39.0–52.0)
Hemoglobin: 14.4 g/dL (ref 13.0–17.0)
MCH: 29.4 pg (ref 26.0–34.0)
MCHC: 36.5 g/dL — ABNORMAL HIGH (ref 30.0–36.0)
MCV: 80.4 fL (ref 80.0–100.0)
Platelets: 230 10*3/uL (ref 150–400)
RBC: 4.9 MIL/uL (ref 4.22–5.81)
RDW: 13.9 % (ref 11.5–15.5)
WBC: 10 10*3/uL (ref 4.0–10.5)
nRBC: 0 % (ref 0.0–0.2)

## 2020-08-24 LAB — RESPIRATORY PANEL BY RT PCR (FLU A&B, COVID)
Influenza A by PCR: NEGATIVE
Influenza B by PCR: NEGATIVE
SARS Coronavirus 2 by RT PCR: NEGATIVE

## 2020-08-24 LAB — ETHANOL: Alcohol, Ethyl (B): <10 mg/dL (ref <10)

## 2020-08-24 LAB — SALICYLATE LEVEL: Salicylate Lvl: <7 mg/dL — ABNORMAL LOW (ref 7.0–30.0)

## 2020-08-24 MED ORDER — TRIHEXYPHENIDYL HCL 2 MG PO TABS
2.0000 mg | ORAL_TABLET | Freq: Two times a day (BID) | ORAL | Status: DC
  Administered 2020-08-24 – 2020-08-25 (×2): 2 mg via ORAL
  Filled 2020-08-24 (×3): qty 1

## 2020-08-24 MED ORDER — ARIPIPRAZOLE 5 MG PO TABS
5.0000 mg | ORAL_TABLET | Freq: Every day | ORAL | Status: DC
  Administered 2020-08-24: 5 mg via ORAL
  Filled 2020-08-24 (×2): qty 1

## 2020-08-24 MED ORDER — LAMOTRIGINE 25 MG PO TABS
25.0000 mg | ORAL_TABLET | Freq: Two times a day (BID) | ORAL | Status: DC
  Administered 2020-08-24: 25 mg via ORAL
  Filled 2020-08-24 (×3): qty 1

## 2020-08-24 MED ORDER — MIRTAZAPINE 15 MG PO TABS
30.0000 mg | ORAL_TABLET | Freq: Every day | ORAL | Status: DC
  Administered 2020-08-24: 30 mg via ORAL
  Filled 2020-08-24: qty 2

## 2020-08-24 MED ORDER — TRIHEXYPHENIDYL HCL 2 MG PO TABS: 1.0000 mg | ORAL_TABLET | Freq: Two times a day (BID) | ORAL | Status: DC

## 2020-08-24 MED ORDER — ARIPIPRAZOLE ER 400 MG IM SRER
400.0000 mg | Freq: Once | INTRAMUSCULAR | Status: AC
  Administered 2020-08-24: 400 mg via INTRAMUSCULAR
  Filled 2020-08-24: qty 2

## 2020-08-24 NOTE — ED Notes (Signed)
IVC/  PENDING  PLACEMENT 

## 2020-08-24 NOTE — ED Notes (Signed)
Pt given a meal tray 

## 2020-08-24 NOTE — ED Notes (Signed)
Hourly rounding reveals patient in room. No complaints, stable, in no acute distress. Q15 minute rounds and monitoring via Rover and Officer to continue.   

## 2020-08-24 NOTE — ED Triage Notes (Signed)
Pt to ED via [facility] with IVC papers, pt reports that he has been off of his psych meds x 4 months and the past few days he has been hearing voices. Pt denies SI currently but states that the voices tell him to hurt himself sometimes. Pt cooperative in triage.

## 2020-08-24 NOTE — ED Notes (Signed)
Patient transferred to [facility] from ED to room 5 after screening for contraband. Report received from RN including Situation, Background, Assessment and Recommendations. Pt oriented to unit including Q15 minute rounds as well as the security cameras for their protection. Patient is alert and oriented, warm and dry in no acute distress. Patient denies SI, HI, and VH. Pt. Encouraged to let this nurse know if needs arise.

## 2020-08-24 NOTE — ED Notes (Signed)
Hourly rounding completed at this time, patient currently asleep in room. No complaints, stable, and in no acute distress. Q15 minute rounds and monitoring via Security Cameras to continue. 
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
## 2020-08-24 NOTE — ED Notes (Signed)
Report to include Situation, Background, Assessment, and Recommendations received from [facility]. Patient alert and oriented, warm and dry, in no acute distress. Patient reported SI and AVH. Denied HI and pain. Patient made aware of Q15 minute rounds and Psychologist, counselling presence for their safety. Patient instructed to come to me with needs or concerns.

## 2020-08-24 NOTE — ED Notes (Signed)
Pt belongings: Black jeans, black tshirt, black shoes a pair of socks, and blue boxer.

## 2020-08-24 NOTE — BH Assessment (Signed)
Referral information for Psychiatric Hospitalization faxed to:   [facility] ([phone]- [phone]),    [address] [phone]),    [facility] [phone] -or- [phone]),    [facility] [phone])  [phone]   [facility] [phone] or [phone])   [facility] [phone] or [phone])   [facility] [phone] or [phone]),
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
## 2020-08-24 NOTE — ED Notes (Signed)
Hourly rounding completed at this time, patient currently awake in room. No complaints, stable, and in no acute distress. Q15 minute rounds and monitoring via Security Cameras to continue. 
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
## 2020-08-24 NOTE — ED Notes (Signed)
BLOOD GLUCOSE 137

## 2020-08-25 NOTE — BH Assessment (Signed)
Referral Checks:             [facility] ([phone]- [phone]),              [address] [phone]),              [facility] [phone] -or- [phone]), Re-faxed referral at 2:50am on 09/12/20             [facility] [phone])           [phone]             [facility] [phone] or [phone])             [facility] [phone] or [phone])             [facility] [phone] or [phone]

## 2020-08-25 NOTE — ED Notes (Signed)
Hourly rounding completed at this time, patient currently asleep in room. No complaints, stable, and in no acute distress. Q15 minute rounds and monitoring via Security Cameras to continue. 

## 2020-08-25 NOTE — BH Assessment (Signed)
PATIENT BED AVAILABLE AFTER 9AM  Patient has been accepted to [facility].  Patient assigned to [facility] Accepting physician is Dr. Uma Thotakura.  Call report to [phone] or [phone]  Representative was Bryce.   ER Staff is aware of it:  Kathy ER Secretary  Dr. Brown, ER MD  Chris Patient's Nurse     Address: [address] [place], [address] Patient must check in at the [facility] 

## 2020-08-25 NOTE — Progress Notes (Signed)
Patient ID: Dominic [name], male   DOB: January 26, 1976, 44 y.o.   MRN: [id number]    Progress Note brief  Patient was [facility] rather than [facility]  His cocaine intoxication subsided he wanted to go home.   He has no active SI HI or plans No new mood or psychosis  Contracts for safety   He was worried about losing his job  He has no current investment in drug program rehab or inpatient or outpatient meds   He knows risks and benefits  He has no active Medical problems or side effects at discharge  IVC rescinded  Admission cancelled  ER MD,  ER CW and ER Psych RN notified  Rama Candise Bowens MD

## 2020-08-25 NOTE — BH Assessment (Signed)
Writer called and informed [facility]([phone]) the patient was being discharged and no longer needed the bed.

## 2020-08-25 NOTE — ED Notes (Signed)
Hourly rounding completed at this time, patient currently awake in restroom. No complaints, stable, and in no acute distress. Q15 minute rounds and monitoring via Security Cameras to continue. 

## 2020-10-13 ENCOUNTER — Encounter: Payer: Self-pay | Admitting: Intensive Care

## 2020-10-13 ENCOUNTER — Other Ambulatory Visit: Payer: Self-pay

## 2020-10-13 ENCOUNTER — Emergency Department
Admission: EM | Admit: 2020-10-13 | Discharge: 2020-10-14 | Disposition: A | Discharge status: Home / Self Care | Payer: Medicaid Other | Attending: Emergency Medicine | Admitting: Emergency Medicine

## 2020-10-13 DIAGNOSIS — F209 Schizophrenia, unspecified: Secondary | ICD-10-CM | POA: Diagnosis present

## 2020-10-13 DIAGNOSIS — F17210 Nicotine dependence, cigarettes, uncomplicated: Secondary | ICD-10-CM | POA: Insufficient documentation

## 2020-10-13 DIAGNOSIS — F1410 Cocaine abuse, uncomplicated: Secondary | ICD-10-CM | POA: Diagnosis present

## 2020-10-13 DIAGNOSIS — J45909 Unspecified asthma, uncomplicated: Secondary | ICD-10-CM | POA: Insufficient documentation

## 2020-10-13 DIAGNOSIS — R443 Hallucinations, unspecified: Secondary | ICD-10-CM | POA: Insufficient documentation

## 2020-10-13 DIAGNOSIS — Z765 Malingerer [conscious simulation]: Secondary | ICD-10-CM

## 2020-10-13 DIAGNOSIS — B182 Chronic viral hepatitis C: Secondary | ICD-10-CM | POA: Diagnosis present

## 2020-10-13 DIAGNOSIS — F1010 Alcohol abuse, uncomplicated: Secondary | ICD-10-CM | POA: Diagnosis present

## 2020-10-13 LAB — COMPREHENSIVE METABOLIC PANEL
ALT: 35 U/L (ref 0–44)
AST: 37 U/L (ref 15–41)
Albumin: 4.1 g/dL (ref 3.5–5.0)
Alkaline Phosphatase: 37 U/L — ABNORMAL LOW (ref 38–126)
Anion gap: 7 (ref 5–15)
BUN: 15 mg/dL (ref 6–20)
CO2: 24 mmol/L (ref 22–32)
Calcium: 8.8 mg/dL — ABNORMAL LOW (ref 8.9–10.3)
Chloride: 104 mmol/L (ref 98–111)
Creatinine, Ser: 0.75 mg/dL (ref 0.61–1.24)
GFR, Estimated: >60 mL/min (ref >60)
Glucose, Bld: 116 mg/dL — ABNORMAL HIGH (ref 70–99)
Potassium: 3.3 mmol/L — ABNORMAL LOW (ref 3.5–5.1)
Sodium: 135 mmol/L (ref 135–145)
Total Bilirubin: 1.2 mg/dL (ref 0.3–1.2)
Total Protein: 7.9 g/dL (ref 6.5–8.1)

## 2020-10-13 LAB — CBC
HCT: 39.4 % (ref 39.0–52.0)
Hemoglobin: 14.4 g/dL (ref 13.0–17.0)
MCH: 29.8 pg (ref 26.0–34.0)
MCHC: 36.5 g/dL — ABNORMAL HIGH (ref 30.0–36.0)
MCV: 81.6 fL (ref 80.0–100.0)
Platelets: 221 10*3/uL (ref 150–400)
RBC: 4.83 MIL/uL (ref 4.22–5.81)
RDW: 14.4 % (ref 11.5–15.5)
WBC: 11.6 10*3/uL — ABNORMAL HIGH (ref 4.0–10.5)
nRBC: 0 % (ref 0.0–0.2)

## 2020-10-13 LAB — URINE DRUG SCREEN, QUALITATIVE (ARMC ONLY)
Amphetamines, Ur Screen: NOT DETECTED
Barbiturates, Ur Screen: NOT DETECTED
Benzodiazepine, Ur Scrn: NOT DETECTED
Cannabinoid 50 Ng, Ur ~~LOC~~: POSITIVE — AB
Cocaine Metabolite,Ur ~~LOC~~: POSITIVE — AB
MDMA (Ecstasy)Ur Screen: NOT DETECTED
Methadone Scn, Ur: NOT DETECTED
Opiate, Ur Screen: NOT DETECTED
Phencyclidine (PCP) Ur S: NOT DETECTED
Tricyclic, Ur Screen: NOT DETECTED

## 2020-10-13 LAB — ETHANOL: Alcohol, Ethyl (B): <10 mg/dL (ref <10)

## 2020-10-13 LAB — SALICYLATE LEVEL: Salicylate Lvl: <7 mg/dL — ABNORMAL LOW (ref 7.0–30.0)

## 2020-10-13 LAB — ACETAMINOPHEN LEVEL: Acetaminophen (Tylenol), Serum: <10 ug/mL — ABNORMAL LOW (ref 10–30)

## 2020-10-13 MED ORDER — LORAZEPAM 2 MG PO TABS: 0.0000 mg | ORAL_TABLET | Freq: Four times a day (QID) | ORAL | Status: DC

## 2020-10-13 MED ORDER — THIAMINE HCL 100 MG PO TABS
100.0000 mg | ORAL_TABLET | Freq: Every day | ORAL | Status: DC
  Administered 2020-10-13: 100 mg via ORAL
  Filled 2020-10-13 (×2): qty 1

## 2020-10-13 MED ORDER — ARIPIPRAZOLE ER 400 MG IM SRER
400.0000 mg | INTRAMUSCULAR | Status: DC
  Administered 2020-10-13: 400 mg via INTRAMUSCULAR
  Filled 2020-10-13: qty 2

## 2020-10-13 MED ORDER — THIAMINE HCL 100 MG/ML IJ SOLN: 100.0000 mg | Freq: Every day | INTRAMUSCULAR | Status: DC

## 2020-10-13 MED ORDER — LORAZEPAM 2 MG/ML IJ SOLN: 0.0000 mg | Freq: Four times a day (QID) | INTRAMUSCULAR | Status: DC

## 2020-10-13 MED ORDER — LORAZEPAM 2 MG/ML IJ SOLN: 0.0000 mg | Freq: Two times a day (BID) | INTRAMUSCULAR | Status: DC

## 2020-10-13 MED ORDER — LORAZEPAM 2 MG PO TABS: 0.0000 mg | ORAL_TABLET | Freq: Two times a day (BID) | ORAL | Status: DC

## 2020-10-13 NOTE — ED Notes (Signed)
Patient belongings: white mask, blue jeans, white nike tennis shoes, white t shirt, brown wallet, lighter, black leather jacket  Patient denies having any money on him

## 2020-10-13 NOTE — ED Notes (Signed)
VOL, does not meet criteria for admit

## 2020-10-13 NOTE — ED Notes (Signed)
Hourly rounding completed at this time, patient currently asleep in room. No complaints, stable, and in no acute distress. Q15 minute rounds and monitoring via Rover and Officer to continue. 
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
## 2020-10-13 NOTE — ED Notes (Signed)
Pt unhappy with staff due to not knowing where his friend is and questioning the plan for her because he is DCd. Pt again informed that he can have a cab if he needs one but pt states he does not want to leave. Pt at this time provided with belongings to change and is sitting on side of bed eating crackers he has opened since being informed that he is up for DC. Pt has attitude and is blunt/loud toward this nurse. Will continue to monitor.

## 2020-10-13 NOTE — Consult Note (Signed)
[facility]   Reason for Consult: Consult for this 44 year old man well-known to the emergency room and came in voluntarily Referring Physician: Su HoffJesup Patient Identification: Dominic SloughCedric Deon [name] MRN:  [id number] Principal Diagnosis: Schizophrenia, chronic condition (HCC) Diagnosis:  Principal Problem:   Schizophrenia, chronic condition (HCC) Active Problems:   Alcohol abuse   Cocaine abuse (HCC)   Chronic hepatitis C without hepatic coma (HCC)   Malingering   Total Time spent with patient: 1 hour  Subjective:   Dominic [name] is a 44 y.o. male patient admitted with "hearing things and seeing things".  HPI: Patient seen chart reviewed.  Patient known from prior encounters.  44 year old man with a history of chronic mental health problems and substance abuse came voluntarily to the emergency room.  He says that he is "hearing things and seeing things".  When I asked him to describe them he just mumbled.  When I ask him how long this is been going on he said "a long time".  Patient has been using crack cocaine and drinking regularly.  He claims he has been off of his medicine for "at least a year" which is demonstratable he not true by his visits to the hospital.  Patient apparently just got thrown out of the place where he was living today and has no stable place to stay.  He has not shown any signs of responding to internal stimuli or agitated violent behavior.  Past Psychiatric History: Past history of diagnosis of psychosis and substance abuse.  When sober usually complies with medication.  Has been on long-acting injectables in the past with some benefit.  Frequently presents with dramatically reported symptoms that resolved almost immediately once he sobers up.  Known history of actual suicide attempts  Risk to Self:   Risk to Others:   Prior Inpatient Therapy:   Prior Outpatient Therapy:    Past Medical History:  Past Medical History:  Diagnosis Date  .  Anxiety   . Asthma   . Bipolar 1 disorder (HCC)   . Chronic hepatitis C without hepatic coma (HCC) 05/28/2018  . Depression   . Hepatitis C   . Hepatitis C antibody positive in blood 05/28/2018  . Schizophrenia (HCC)   . Sleep apnea     Past Surgical History:  Procedure Laterality Date  . CYST REMOVAL NECK     neck   Family History:  Family History  Problem Relation Age of Onset  . Hypertension Sister   . Schizophrenia Sister    Family Psychiatric  History: See previous Social History:  Social History   Substance and Sexual Activity  Alcohol Use Yes  . Alcohol/week: 3.0 standard drinks  . Types: 3 Shots of liquor per week     Social History   Substance and Sexual Activity  Drug Use Yes  . Types: Cocaine, Marijuana   Comment: last used cocaine Thursday and MJ use daily    Social History   Socioeconomic History  . Marital status: Single    Spouse name: Not on file  . Number of children: Not on file  . Years of education: Not on file  . Highest education level: Not on file  Occupational History  . Not on file  Tobacco Use  . Smoking status: Current Some Day Smoker    Packs/day: 1.00    Types: Cigarettes  . Smokeless tobacco: Never Used  Vaping Use  . Vaping Use: Never used  Substance and Sexual Activity  . Alcohol use:  Yes    Alcohol/week: 3.0 standard drinks    Types: 3 Shots of liquor per week  . Drug use: Yes    Types: Cocaine, Marijuana    Comment: last used cocaine Thursday and MJ use daily  . Sexual activity: Not Currently  Other Topics Concern  . Not on file  Social History Narrative  . Not on file   Social Determinants of Health   Financial Resource Strain:   . Difficulty of Paying Living Expenses: Not on file  Food Insecurity:   . Worried About Programme researcher, broadcasting/film/video in the Last Year: Not on file  . Ran Out of Food in the Last Year: Not on file  Transportation Needs:   . Lack of Transportation (Medical): Not on file  . Lack of  Transportation (Non-Medical): Not on file  Physical Activity:   . Days of Exercise per Week: Not on file  . Minutes of Exercise per Session: Not on file  Stress:   . Feeling of Stress : Not on file  Social Connections:   . Frequency of Communication with Friends and Family: Not on file  . Frequency of Social Gatherings with Friends and Family: Not on file  . Attends Religious Services: Not on file  . Active Member of Clubs or Organizations: Not on file  . Attends Banker Meetings: Not on file  . Marital Status: Not on file   Additional Social History:    Allergies:  No Known Allergies  Labs:  Results for orders placed or performed during the hospital encounter of 10/13/20 (from the past 48 hour(s))  Comprehensive metabolic panel     Status: Abnormal   Collection Time: 10/13/20  3:23 PM  Result Value Ref Range   Sodium 135 135 - 145 mmol/L   Potassium 3.3 (L) 3.5 - 5.1 mmol/L   Chloride 104 98 - 111 mmol/L   CO2 24 22 - 32 mmol/L   Glucose, Bld 116 (H) 70 - 99 mg/dL    Comment: Glucose reference range applies only to samples taken after fasting for at least 8 hours.   BUN 15 6 - 20 mg/dL   Creatinine, Ser 9.02 0.61 - 1.24 mg/dL   Calcium 8.8 (L) 8.9 - 10.3 mg/dL   Total Protein 7.9 6.5 - 8.1 g/dL   Albumin 4.1 3.5 - 5.0 g/dL   AST 37 15 - 41 U/L   ALT 35 0 - 44 U/L   Alkaline Phosphatase 37 (L) 38 - 126 U/L   Total Bilirubin 1.2 0.3 - 1.2 mg/dL   GFR, Estimated >40 >97 mL/min    Comment: (NOTE) Calculated using the CKD-EPI Creatinine Equation (2021)    Anion gap 7 5 - 15    Comment: Performed at [facility], [address], [place], [place] [address]  Ethanol     Status: None   Collection Time: 10/13/20  3:23 PM  Result Value Ref Range   Alcohol, Ethyl (B) <10 <10 mg/dL    Comment: (NOTE) Lowest detectable limit for serum alcohol is 10 mg/dL.  For medical purposes only. Performed at [facility], [address]  [place], [place] [address]   Salicylate level     Status: Abnormal   Collection Time: 10/13/20  3:23 PM  Result Value Ref Range   Salicylate Lvl <7.0 (L) 7.0 - 30.0 mg/dL    Comment: Performed at [facility], [address], [place], [place] [address]  Acetaminophen level  Status: Abnormal   Collection Time: 10/13/20  3:23 PM  Result Value Ref Range   Acetaminophen (Tylenol), Serum <10 (L) 10 - 30 ug/mL    Comment: (NOTE) Therapeutic concentrations vary significantly. A range of 10-30 ug/mL  may be an effective concentration for many patients. However, some  are best treated at concentrations outside of this range. Acetaminophen concentrations >150 ug/mL at 4 hours after ingestion  and >50 ug/mL at 12 hours after ingestion are often associated with  toxic reactions.  Performed at [facility], [address] [place], [place] [address]   cbc     Status: Abnormal   Collection Time: 10/13/20  3:23 PM  Result Value Ref Range   WBC 11.6 (H) 4.0 - 10.5 K/uL   RBC 4.83 4.22 - 5.81 MIL/uL   Hemoglobin 14.4 13.0 - 17.0 g/dL   HCT 42.7 39 - 52 %   MCV 81.6 80.0 - 100.0 fL   MCH 29.8 26.0 - 34.0 pg   MCHC 36.5 (H) 30.0 - 36.0 g/dL   RDW 06.2 37.6 - 28.3 %   Platelets 221 150 - 400 K/uL   nRBC 0.0 0.0 - 0.2 %    Comment: Performed at [facility], [address], [place], [place] [address]    Current Facility-Administered Medications  Medication Dose Route Frequency Provider Last Rate Last Admin  . ARIPiprazole ER (ABILIFY MAINTENA) injection 400 mg  400 mg Intramuscular Q28 days Belissa Kooy T, MD      . LORazepam (ATIVAN) injection 0-4 mg  0-4 mg Intravenous Q6H Chesley Noon, MD       Or  . LORazepam (ATIVAN) tablet 0-4 mg  0-4 mg Oral Q6H Chesley Noon, MD      . Melene Muller ON 10/16/2020] LORazepam (ATIVAN) injection 0-4 mg  0-4 mg Intravenous Q12H Chesley Noon, MD       Or  . Melene Muller ON 10/16/2020] LORazepam (ATIVAN) tablet 0-4 mg  0-4 mg Oral  Q12H Chesley Noon, MD      . thiamine tablet 100 mg  100 mg Oral Daily Chesley Noon, MD       Or  . thiamine (B-1) injection 100 mg  100 mg Intravenous Daily Chesley Noon, MD       No current outpatient medications on file.    Musculoskeletal: Strength & Muscle Tone: within normal limits Gait & Station: normal Patient leans: N/A  Psychiatric Specialty Exam: Physical Exam Vitals and nursing note reviewed.  Constitutional:      Appearance: He is well-developed.  HENT:     Head: Normocephalic and atraumatic.  Eyes:     Conjunctiva/sclera: Conjunctivae normal.     Pupils: Pupils are equal, round, and reactive to light.  Cardiovascular:     Heart sounds: Normal heart sounds.  Pulmonary:     Effort: Pulmonary effort is normal.  Abdominal:     Palpations: Abdomen is soft.  Musculoskeletal:        General: Normal range of motion.     Cervical back: Normal range of motion.  Skin:    General: Skin is warm and dry.  Neurological:     General: No focal deficit present.     Mental Status: He is alert.  Psychiatric:        Attention and Perception: He is inattentive.        Mood and Affect: Affect is blunt.        Speech: Speech is delayed.  Behavior: Behavior is not agitated, aggressive or hyperactive.        Thought Content: Thought content does not include homicidal or suicidal ideation.        Cognition and Memory: Cognition is impaired. Memory is impaired.        Judgment: Judgment is inappropriate.     Review of Systems  Constitutional: Negative.   HENT: Negative.   Eyes: Negative.   Respiratory: Negative.   Cardiovascular: Negative.   Gastrointestinal: Negative.   Musculoskeletal: Negative.   Skin: Negative.   Neurological: Negative.   Psychiatric/Behavioral: Positive for hallucinations.    Blood pressure 135/78, pulse 69, temperature 98.6 F (37 C), temperature source Oral, resp. rate 16, height 5\' 11"  (1.803 m), weight 90.7 kg, SpO2 94 %.Body mass  index is 27.89 kg/m.  General Appearance: Disheveled  Eye Contact:  Minimal  Speech:  Slow  Volume:  Decreased  Mood:  Depressed  Affect:  Constricted  Thought Process:  Coherent  Orientation:  Full (Time, Place, and Person)  Thought Content:  Hallucinations: Auditory Visual Patient subjectively claims to be having hallucinations but there is no evidence that he seems to be responding to internal stimuli.  Suicidal Thoughts:  No  Homicidal Thoughts:  No  Memory:  Immediate;   Fair Recent;   Poor Remote;   Poor  Judgement:  Impaired  Insight:  Shallow  Psychomotor Activity:  Decreased  Concentration:  Concentration: Poor  Recall:  Poor  Fund of Knowledge:  Poor  Language:  Poor  Akathisia:  No  Handed:  Right  AIMS (if indicated):     Assets:  Resilience  ADL's:  Impaired  Cognition:  Impaired,  Mild  Sleep:        Treatment Plan Summary: Plan Patient presenting to the emergency room claiming to have very vague symptoms coincident with having become homeless.  Active drug abuse.  No evidence of acute dangerous behavior.  Very difficult not to reach the obvious conclusion that the patient is creating or exaggerating symptoms for the sake of a place to stay.  Patient will be given Abilify long-acting injection 400 mg and referred back to [facility].  No indication for inpatient hospitalization.  Encourage patient to get back into treatment at [facility] and stop using drugs and alcohol.  Disposition: Patient does not meet criteria for psychiatric inpatient admission.  [facility], MD 10/13/2020 4:51 PM

## 2020-10-13 NOTE — ED Notes (Signed)
Pt refuses to sign when he exits room, pt will not speak to staff as he leaves unit. Pt continues to walk toward the exit

## 2020-10-13 NOTE — ED Notes (Signed)
Report received from Bill, RN including situation, background, assessment and recommendations. Patient sleeping, respirations regular and unlabored. Q15 minute rounds and security camera observation to continue. Will assess patient once awake. 
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
## 2020-10-13 NOTE — ED Notes (Signed)
Gave a [place] tray with juice.
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
## 2020-10-14 NOTE — ED Notes (Signed)
Pt in lobby being loud and continues with his poor mood. Pt states he received no help and has had no change in condition per triage staff, Dr. Terrilee Files informed and speaks to pt in triage room 2 with this nurse present. Pt continues to reference same complaints as arrival. Dr. Katrinka Blazing states pt is to remain up for DC and not to be returning for treatment. Pt states he is not happy with [facility] and may reach out to [place], encouraged by Dr. Katrinka Blazing. Pt has provided address and cab is attempting to be contacted. Pt states "if I die, I die" and returns to lobby. Dr. Katrinka Blazing informed pt that he could also wait for bus if needed as well. Pt to lobby now

## 2020-11-18 ENCOUNTER — Other Ambulatory Visit: Payer: Self-pay

## 2020-11-18 ENCOUNTER — Emergency Department
Admission: EM | Admit: 2020-11-18 | Discharge: 2020-11-23 | Disposition: A | Discharge status: Home / Self Care | Payer: No Typology Code available for payment source | Attending: Emergency Medicine | Admitting: Emergency Medicine

## 2020-11-18 ENCOUNTER — Emergency Department: Payer: No Typology Code available for payment source

## 2020-11-18 ENCOUNTER — Emergency Department
Admission: EM | Admit: 2020-11-18 | Discharge: 2020-11-18 | Discharge status: Against Medical Advice | Payer: Medicaid Other

## 2020-11-18 DIAGNOSIS — R440 Auditory hallucinations: Secondary | ICD-10-CM | POA: Diagnosis present

## 2020-11-18 DIAGNOSIS — F1010 Alcohol abuse, uncomplicated: Secondary | ICD-10-CM | POA: Diagnosis present

## 2020-11-18 DIAGNOSIS — J45909 Unspecified asthma, uncomplicated: Secondary | ICD-10-CM | POA: Insufficient documentation

## 2020-11-18 DIAGNOSIS — F29 Unspecified psychosis not due to a substance or known physiological condition: Secondary | ICD-10-CM | POA: Diagnosis present

## 2020-11-18 DIAGNOSIS — F203 Undifferentiated schizophrenia: Secondary | ICD-10-CM | POA: Diagnosis not present

## 2020-11-18 DIAGNOSIS — R45851 Suicidal ideations: Secondary | ICD-10-CM

## 2020-11-18 DIAGNOSIS — F209 Schizophrenia, unspecified: Secondary | ICD-10-CM | POA: Diagnosis present

## 2020-11-18 DIAGNOSIS — F1410 Cocaine abuse, uncomplicated: Secondary | ICD-10-CM | POA: Diagnosis not present

## 2020-11-18 DIAGNOSIS — U071 COVID-19: Secondary | ICD-10-CM | POA: Diagnosis not present

## 2020-11-18 DIAGNOSIS — Z765 Malingerer [conscious simulation]: Secondary | ICD-10-CM

## 2020-11-18 DIAGNOSIS — F10920 Alcohol use, unspecified with intoxication, uncomplicated: Secondary | ICD-10-CM | POA: Diagnosis present

## 2020-11-18 DIAGNOSIS — F17210 Nicotine dependence, cigarettes, uncomplicated: Secondary | ICD-10-CM | POA: Diagnosis not present

## 2020-11-18 LAB — COMPREHENSIVE METABOLIC PANEL
ALT: 35 U/L (ref 0–44)
AST: 42 U/L — ABNORMAL HIGH (ref 15–41)
Albumin: 4.1 g/dL (ref 3.5–5.0)
Alkaline Phosphatase: 36 U/L — ABNORMAL LOW (ref 38–126)
Anion gap: 9 (ref 5–15)
BUN: 14 mg/dL (ref 6–20)
CO2: 27 mmol/L (ref 22–32)
Calcium: 9 mg/dL (ref 8.9–10.3)
Chloride: 101 mmol/L (ref 98–111)
Creatinine, Ser: 1.02 mg/dL (ref 0.61–1.24)
GFR, Estimated: >60 mL/min (ref >60)
Glucose, Bld: 81 mg/dL (ref 70–99)
Potassium: 3.4 mmol/L — ABNORMAL LOW (ref 3.5–5.1)
Sodium: 137 mmol/L (ref 135–145)
Total Bilirubin: 0.9 mg/dL (ref 0.3–1.2)
Total Protein: 8.3 g/dL — ABNORMAL HIGH (ref 6.5–8.1)

## 2020-11-18 LAB — CBC
HCT: 40.1 % (ref 39.0–52.0)
Hemoglobin: 14.4 g/dL (ref 13.0–17.0)
MCH: 29.8 pg (ref 26.0–34.0)
MCHC: 35.9 g/dL (ref 30.0–36.0)
MCV: 83 fL (ref 80.0–100.0)
Platelets: 202 10*3/uL (ref 150–400)
RBC: 4.83 MIL/uL (ref 4.22–5.81)
RDW: 14.4 % (ref 11.5–15.5)
WBC: 8.5 10*3/uL (ref 4.0–10.5)
nRBC: 0 % (ref 0.0–0.2)

## 2020-11-18 LAB — ETHANOL: Alcohol, Ethyl (B): <10 mg/dL (ref <10)

## 2020-11-18 LAB — SALICYLATE LEVEL: Salicylate Lvl: <7 mg/dL — ABNORMAL LOW (ref 7.0–30.0)

## 2020-11-18 LAB — POC SARS CORONAVIRUS 2 AG: SARS Coronavirus 2 Ag: POSITIVE — AB

## 2020-11-18 LAB — POC SARS CORONAVIRUS 2 AG -  ED: SARS Coronavirus 2 Ag: POSITIVE — AB

## 2020-11-18 LAB — ACETAMINOPHEN LEVEL: Acetaminophen (Tylenol), Serum: <10 ug/mL — ABNORMAL LOW (ref 10–30)

## 2020-11-18 MED ORDER — IBUPROFEN 800 MG PO TABS
800.0000 mg | ORAL_TABLET | Freq: Three times a day (TID) | ORAL | Status: DC | PRN
  Administered 2020-11-19 – 2020-11-23 (×5): 800 mg via ORAL
  Filled 2020-11-18 (×5): qty 1

## 2020-11-18 MED ORDER — LORAZEPAM 1 MG PO TABS
1.0000 mg | ORAL_TABLET | Freq: Four times a day (QID) | ORAL | Status: DC | PRN
  Administered 2020-11-22: 1 mg via ORAL
  Filled 2020-11-18: qty 1

## 2020-11-18 MED ORDER — ACETAMINOPHEN 500 MG PO TABS: 1000.0000 mg | ORAL_TABLET | ORAL | Status: AC
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
## 2020-11-18 NOTE — ED Notes (Signed)
Pt dressed out into hospital scrubs by this RN and Herbert Seta, NT. Belongings bagged include:  Hydrographic surveyor  [employer] Gray pants  Black pants  [employer]
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
## 2020-11-18 NOTE — ED Notes (Signed)
Pt. To BHU from ED ambulatory without difficulty, to room  BHU 6. Report from triage RN. Pt. Is alert and oriented, warm and dry in no distress. Pt. Denies  HI, and AVH. Pt states he is having SI thoughts but does not have a plan currently. Patient contracts for safety. Pt. Calm and cooperative. Pt. Made aware of security cameras and Q15 minute rounds. Pt. Encouraged to let Nursing staff know of any concerns or needs.

## 2020-11-19 DIAGNOSIS — U071 COVID-19: Secondary | ICD-10-CM

## 2020-11-19 DIAGNOSIS — F203 Undifferentiated schizophrenia: Secondary | ICD-10-CM

## 2020-11-19 MED ORDER — ARIPIPRAZOLE 2 MG PO TABS
20.0000 mg | ORAL_TABLET | Freq: Every day | ORAL | Status: DC
  Filled 2020-11-19: qty 10

## 2020-11-19 MED ORDER — ARIPIPRAZOLE 10 MG PO TABS
20.0000 mg | ORAL_TABLET | Freq: Every day | ORAL | Status: DC
  Administered 2020-11-19 – 2020-11-23 (×4): 20 mg via ORAL
  Filled 2020-11-19 (×9): qty 2

## 2020-11-19 MED ORDER — TRAZODONE HCL 100 MG PO TABS
100.0000 mg | ORAL_TABLET | Freq: Every day | ORAL | Status: DC
  Administered 2020-11-19 – 2020-11-21 (×3): 100 mg via ORAL
  Filled 2020-11-19 (×4): qty 1

## 2020-11-19 NOTE — ED Notes (Signed)
He is sleeping  VS to be obtained when he awakens  
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
## 2020-11-19 NOTE — ED Notes (Signed)
IVC  CONSULT  DONE  PENDING  PLACEMENT 
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
## 2020-11-19 NOTE — ED Notes (Signed)
Unable to obtain vitals due to patient sleeping. Will continue to monitor.   

## 2020-11-19 NOTE — Consult Note (Incomplete)
[facility] Face-to-Face Psychiatry Consult   Reason for Consult: Hallucinations Referring Physician: Dr. Jacqualine Code Patient Identification: Dominic [name] MRN:  [id number] Principal Diagnosis: <principal problem not specified> Diagnosis:  Active Problems:   Psychosis ([place])   Alcohol abuse   Cocaine abuse ([place])   Nicotine dependence, cigarettes, uncomplicated   Alcohol intoxication, uncomplicated ([place])   Schizophrenia, chronic condition ([place])   Schizophrenia ([place])   Malingering   Total Time spent with patient: 20 minutes  Subjective: "I am suicidal." Dominic [name] is a 45 y.o. male patient admitted with ***.  HPI:  ***  Past Psychiatric History: ***  Risk to Self:   Risk to Others:   Prior Inpatient Therapy:   Prior Outpatient Therapy:    Past Medical History:  Past Medical History:  Diagnosis Date  . Anxiety   . Asthma   . Bipolar 1 disorder ([place])   . Chronic hepatitis C without hepatic coma ([place]) 05/28/2018  . Depression   . Hepatitis C   . Hepatitis C antibody positive in blood 05/28/2018  . Schizophrenia ([place])   . Sleep apnea     Past Surgical History:  Procedure Laterality Date  . CYST REMOVAL NECK     neck   Family History:  Family History  Problem Relation Age of Onset  . Hypertension Sister   . Schizophrenia Sister    Family Psychiatric  History: *** Social History:  Social History   Substance and Sexual Activity  Alcohol Use Yes  . Alcohol/week: 3.0 standard drinks  . Types: 3 Shots of liquor per week     Social History   Substance and Sexual Activity  Drug Use Yes  . Types: Cocaine, Marijuana   Comment: last used today    Social History   Socioeconomic History  . Marital status: Single    Spouse name: Not on file  . Number of children: Not on file  . Years of education: Not on file  . Highest education level: Not on file  Occupational History  . Not on file  Tobacco Use  . Smoking status: Current Some Day Smoker    Packs/day: 1.00     Types: Cigarettes  . Smokeless tobacco: Never Used  Vaping Use  . Vaping Use: Never used  Substance and Sexual Activity  . Alcohol use: Yes    Alcohol/week: 3.0 standard drinks    Types: 3 Shots of liquor per week  . Drug use: Yes    Types: Cocaine, Marijuana    Comment: last used today  . Sexual activity: Not Currently  Other Topics Concern  . Not on file  Social History Narrative  . Not on file   Social Determinants of Health   Financial Resource Strain: Not on file  Food Insecurity: Not on file  Transportation Needs: Not on file  Physical Activity: Not on file  Stress: Not on file  Social Connections: Not on file   Additional Social History:    Allergies:  No Known Allergies  Labs:  Results for orders placed or performed during the hospital encounter of 11/18/20 (from the past 48 hour(s))  Comprehensive metabolic panel     Status: Abnormal   Collection Time: 11/18/20  6:29 PM  Result Value Ref Range   Sodium 137 135 - 145 mmol/L   Potassium 3.4 (L) 3.5 - 5.1 mmol/L   Chloride 101 98 - 111 mmol/L   CO2 27 22 - 32 mmol/L   Glucose, Bld 81 70 - 99 mg/dL  Comment: Glucose reference range applies only to samples taken after fasting for at least 8 hours.   BUN 14 6 - 20 mg/dL   Creatinine, Ser 9.69 0.61 - 1.24 mg/dL   Calcium 9.0 8.9 - 40.9 mg/dL   Total Protein 8.3 (H) 6.5 - 8.1 g/dL   Albumin 4.1 3.5 - 5.0 g/dL   AST 42 (H) 15 - 41 U/L   ALT 35 0 - 44 U/L   Alkaline Phosphatase 36 (L) 38 - 126 U/L   Total Bilirubin 0.9 0.3 - 1.2 mg/dL   GFR, Estimated >82 >86 mL/min    Comment: (NOTE) Calculated using the CKD-EPI Creatinine Equation (2021)    Anion gap 9 5 - 15    Comment: Performed at [facility], [address] [place], [place] [address]  Ethanol     Status: None   Collection Time: 11/18/20  6:29 PM  Result Value Ref Range   Alcohol, Ethyl (B) <10 <10 mg/dL    Comment: (NOTE) Lowest detectable limit for serum alcohol is 10 mg/dL.  For  medical purposes only. Performed at [facility], [address] [place], [place] [address]   Salicylate level     Status: Abnormal   Collection Time: 11/18/20  6:29 PM  Result Value Ref Range   Salicylate Lvl <7.0 (L) 7.0 - 30.0 mg/dL    Comment: Performed at [facility], [address] [place], [place] [address]  Acetaminophen level     Status: Abnormal   Collection Time: 11/18/20  6:29 PM  Result Value Ref Range   Acetaminophen (Tylenol), Serum <10 (L) 10 - 30 ug/mL    Comment: (NOTE) Therapeutic concentrations vary significantly. A range of 10-30 ug/mL  may be an effective concentration for many patients. However, some  are best treated at concentrations outside of this range. Acetaminophen concentrations >150 ug/mL at 4 hours after ingestion  and >50 ug/mL at 12 hours after ingestion are often associated with  toxic reactions.  Performed at [facility], [address] [place], [place] [address]   cbc     Status: None   Collection Time: 11/18/20  6:29 PM  Result Value Ref Range   WBC 8.5 4.0 - 10.5 K/uL   RBC 4.83 4.22 - 5.81 MIL/uL   Hemoglobin 14.4 13.0 - 17.0 g/dL   HCT 77.3 75.0 - 51.0 %   MCV 83.0 80.0 - 100.0 fL   MCH 29.8 26.0 - 34.0 pg   MCHC 35.9 30.0 - 36.0 g/dL   RDW 71.2 52.4 - 79.9 %   Platelets 202 150 - 400 K/uL   nRBC 0.0 0.0 - 0.2 %    Comment: Performed at [facility], [address] [place], [place] [address]  POC SARS Coronavirus 2 Ag-ED - Nasal Swab (BD Veritor Kit)     Status: Abnormal   Collection Time: 11/18/20  6:57 PM  Result Value Ref Range   SARS Coronavirus 2 Ag Positive (A) Negative  POC SARS Coronavirus 2 Ag     Status: Abnormal   Collection Time: 11/18/20  7:01 PM  Result Value Ref Range   SARS Coronavirus 2 Ag POSITIVE (A) NEGATIVE    Comment: (NOTE) SARS-CoV-2 antigen PRESENT.  Positive results indicate the presence of viral antigens, but clinical correlation with patient history and  other diagnostic information is necessary to determine patient infection status.  Positive results do not rule out bacterial infection or co-infection  with other viruses. False positive results  are rare but can occur, and confirmatory RT-PCR testing may be appropriate in some circumstances. The expected result is Negative.  Fact Sheet for Patients: PodPark.tn Fact Sheet for Providers: GiftContent.is   This test is not yet approved or cleared by the [place] FDA and  has been authorized for detection and/or diagnosis of SARS-CoV-2 by FDA under an Emergency Use Authorization (EUA).  This EUA will remain in effect (meaning this test can be used) for the duration of  the COVID-19 declaration under Section 564(b)(1) of the Act, 21 U.S.C. section 360bbb-3(b)(1), unless  the authorization is terminated or revoked sooner.      Current Facility-Administered Medications  Medication Dose Route Frequency Provider Last Rate Last Admin  . acetaminophen (TYLENOL) tablet 1,000 mg  1,000 mg Oral STAT Delman Kitten, MD      . ibuprofen (ADVIL) tablet 800 mg  800 mg Oral Q8H PRN Delman Kitten, MD      . LORazepam (ATIVAN) tablet 1 mg  1 mg Oral Q6H PRN Delman Kitten, MD       No current outpatient medications on file.    Musculoskeletal: Strength & Muscle Tone: within normal limits Gait & Station: normal Patient leans: N/A  Psychiatric Specialty Exam: Physical Exam Vitals and nursing note reviewed.  Constitutional:      Appearance: Normal appearance. He is normal weight.  HENT:     Right Ear: External ear normal.     Left Ear: External ear normal.     Nose: Nose normal.     Mouth/Throat:     Mouth: Mucous membranes are dry.  Eyes:     Extraocular Movements: Extraocular movements intact.  Cardiovascular:     Rate and Rhythm: Normal rate.     Pulses: Normal pulses.  Pulmonary:     Effort: Pulmonary effort is normal.   Musculoskeletal:        General: Normal range of motion.     Cervical back: Normal range of motion and neck supple.  Neurological:     General: No focal deficit present.     Mental Status: He is alert and oriented to person, place, and time. Mental status is at baseline.  Psychiatric:        Attention and Perception: Attention and perception normal.        Mood and Affect: Mood is depressed. Affect is blunt.        Speech: Speech normal.        Behavior: Behavior is aggressive. Behavior is cooperative.        Thought Content: Thought content includes suicidal ideation.        Cognition and Memory: Cognition and memory normal.        Judgment: Judgment is impulsive.     Review of Systems  Psychiatric/Behavioral: Positive for suicidal ideas.    Blood pressure 128/74, pulse 75, temperature 99 F (37.2 C), temperature source Oral, resp. rate 18, height $RemoveBe'5\' 11"'[id number]$  (1.803 m), weight 90.7 kg, SpO2 100 %.Body mass index is 27.89 kg/m.  General Appearance: Casual  Eye Contact:  None  Speech:  Clear and Coherent  Volume:  Normal  Mood:  Depressed  Affect:  Blunt, Congruent and Depressed  Thought Process:  Coherent  Orientation:  Full (Time, Place, and Person)  Thought Content:  Logical  Suicidal Thoughts:  Yes.  without intent/plan  Homicidal Thoughts:  No  Memory:  Immediate;   Good Recent;   Good Remote;   Good  Judgement:  Fair  Insight:  Fair  Psychomotor Activity:  Normal  Concentration:  Concentration: Good  Recall:  Good  Fund of Knowledge:  Good  Language:  Good  Akathisia:  Negative  Handed:  Right  AIMS (if indicated):     Assets:  Communication Skills Desire for Improvement Financial Resources/Insurance Resilience Social Support  ADL's:  Intact  Cognition:  WNL  Sleep:        Treatment Plan Summary: Daily contact with patient to assess and evaluate symptoms and progress in treatment and Medication management  Disposition: Supportive therapy provided about  ongoing stressors. The patient remained on the observation overnight and reassess in the a.m. to determine if he meets criteria for psychiatric inpatient admission or he could be discharged with outpatient resources for substance use.  Caroline Sauger, NP 11/19/2020 1:01 AM

## 2020-11-19 NOTE — Consult Note (Signed)
[place] Psychiatry Consult   Reason for Consult: Consult for 45 year old man with a history of mental illness and substance abuse Referring Physician: Archie Balboa Patient Identification: Dominic [name] MRN:  [id number] Principal Diagnosis: Schizophrenia ([place]) Diagnosis:  Principal Problem:   Schizophrenia ([place]) Active Problems:   Psychosis ([place])   Alcohol abuse   Cocaine abuse ([place])   Nicotine dependence, cigarettes, uncomplicated   Alcohol intoxication, uncomplicated ([place])   Schizophrenia, chronic condition ([place])   COVID-19   Total Time spent with patient: 1 hour  Subjective:   Dominic [name] is a 45 y.o. male patient admitted with "I feel really bad".  HPI: Patient seen chart reviewed.  Patient known from previous encounters.  45 year old man with a history of cocaine abuse and chronic mental illness came to the emergency room initially complaining of COVID symptoms but then also said that he was having auditory hallucinations depression and suicidal ideation.  On my interview I found the patient lying in bed looking like he was feeling pretty sick.  He tells me that for the last 2 months he has been out in the streets using cocaine regularly.  He tells me that he has not been on medicine in 2 years which is obviously untrue since he was here in the hospital just a few months ago but in any case he has not been on any psychiatric medicine recently.  He says he is hearing voices they tell him to kill himself.  Says his mood is very depressed and negative.  He has been using crack cocaine daily.  Past Psychiatric History: Past history of presentations with psychotic and mood symptoms usually in the context of relapse into cocaine use.  Has not been able to maintain sobriety for long.  Did appear in the past to have psychotic symptoms apart from just the drug abuse and did well on long-acting Abilify shot  Risk to Self:   Risk to Others:   Prior Inpatient Therapy:   Prior  Outpatient Therapy:    Past Medical History:  Past Medical History:  Diagnosis Date  . Anxiety   . Asthma   . Bipolar 1 disorder ([place])   . Chronic hepatitis C without hepatic coma ([place]) 05/28/2018  . Depression   . Hepatitis C   . Hepatitis C antibody positive in blood 05/28/2018  . Schizophrenia ([place])   . Sleep apnea     Past Surgical History:  Procedure Laterality Date  . CYST REMOVAL NECK     neck   Family History:  Family History  Problem Relation Age of Onset  . Hypertension Sister   . Schizophrenia Sister    Family Psychiatric  History: See previous Social History:  Social History   Substance and Sexual Activity  Alcohol Use Yes  . Alcohol/week: 3.0 standard drinks  . Types: 3 Shots of liquor per week     Social History   Substance and Sexual Activity  Drug Use Yes  . Types: Cocaine, Marijuana   Comment: last used today    Social History   Socioeconomic History  . Marital status: Single    Spouse name: Not on file  . Number of children: Not on file  . Years of education: Not on file  . Highest education level: Not on file  Occupational History  . Not on file  Tobacco Use  . Smoking status: Current Some Day Smoker    Packs/day: 1.00    Types: Cigarettes  . Smokeless tobacco: Never Used  Vaping Use  . Vaping Use: Never used  Substance and Sexual Activity  . Alcohol use: Yes    Alcohol/week: 3.0 standard drinks    Types: 3 Shots of liquor per week  . Drug use: Yes    Types: Cocaine, Marijuana    Comment: last used today  . Sexual activity: Not Currently  Other Topics Concern  . Not on file  Social History Narrative  . Not on file   Social Determinants of Health   Financial Resource Strain: Not on file  Food Insecurity: Not on file  Transportation Needs: Not on file  Physical Activity: Not on file  Stress: Not on file  Social Connections: Not on file   Additional Social History:    Allergies:  No Known Allergies  Labs:  Results  for orders placed or performed during the hospital encounter of 11/18/20 (from the past 48 hour(s))  Comprehensive metabolic panel     Status: Abnormal   Collection Time: 11/18/20  6:29 PM  Result Value Ref Range   Sodium 137 135 - 145 mmol/L   Potassium 3.4 (L) 3.5 - 5.1 mmol/L   Chloride 101 98 - 111 mmol/L   CO2 27 22 - 32 mmol/L   Glucose, Bld 81 70 - 99 mg/dL    Comment: Glucose reference range applies only to samples taken after fasting for at least 8 hours.   BUN 14 6 - 20 mg/dL   Creatinine, Ser 1.02 0.61 - 1.24 mg/dL   Calcium 9.0 8.9 - 10.3 mg/dL   Total Protein 8.3 (H) 6.5 - 8.1 g/dL   Albumin 4.1 3.5 - 5.0 g/dL   AST 42 (H) 15 - 41 U/L   ALT 35 0 - 44 U/L   Alkaline Phosphatase 36 (L) 38 - 126 U/L   Total Bilirubin 0.9 0.3 - 1.2 mg/dL   GFR, Estimated >60 >60 mL/min    Comment: (NOTE) Calculated using the CKD-EPI Creatinine Equation (2021)    Anion gap 9 5 - 15    Comment: Performed at [facility], [address] [place], [place] [address]  Ethanol     Status: None   Collection Time: 11/18/20  6:29 PM  Result Value Ref Range   Alcohol, Ethyl (B) <10 <10 mg/dL    Comment: (NOTE) Lowest detectable limit for serum alcohol is 10 mg/dL.  For medical purposes only. Performed at [facility], [address] [place], [place] [address]   Salicylate level     Status: Abnormal   Collection Time: 11/18/20  6:29 PM  Result Value Ref Range   Salicylate Lvl <9.4 (L) 7.0 - 30.0 mg/dL    Comment: Performed at [facility], [address] [place], [place] [address]  Acetaminophen level     Status: Abnormal   Collection Time: 11/18/20  6:29 PM  Result Value Ref Range   Acetaminophen (Tylenol), Serum <10 (L) 10 - 30 ug/mL    Comment: (NOTE) Therapeutic concentrations vary significantly. A range of 10-30 ug/mL  may be an effective concentration for many patients. However, some  are best treated at concentrations outside of this  range. Acetaminophen concentrations >150 ug/mL at 4 hours after ingestion  and >50 ug/mL at 12 hours after ingestion are often associated with  toxic reactions.  Performed at [facility], [address] [place],  [address]   cbc     Status: None   Collection Time: 11/18/20  6:29 PM  Result Value Ref Range  WBC 8.5 4.0 - 10.5 K/uL   RBC 4.83 4.22 - 5.81 MIL/uL   Hemoglobin 14.4 13.0 - 17.0 g/dL   HCT 40.1 39.0 - 52.0 %   MCV 83.0 80.0 - 100.0 fL   MCH 29.8 26.0 - 34.0 pg   MCHC 35.9 30.0 - 36.0 g/dL   RDW 14.4 11.5 - 15.5 %   Platelets 202 150 - 400 K/uL   nRBC 0.0 0.0 - 0.2 %    Comment: Performed at [facility], [address] [place], [place] [address]  POC SARS Coronavirus 2 Ag-ED - Nasal Swab (BD Veritor Kit)     Status: Abnormal   Collection Time: 11/18/20  6:57 PM  Result Value Ref Range   SARS Coronavirus 2 Ag Positive (A) Negative  POC SARS Coronavirus 2 Ag     Status: Abnormal   Collection Time: 11/18/20  7:01 PM  Result Value Ref Range   SARS Coronavirus 2 Ag POSITIVE (A) NEGATIVE    Comment: (NOTE) SARS-CoV-2 antigen PRESENT.  Positive results indicate the presence of viral antigens, but clinical correlation with patient history and other diagnostic information is necessary to determine patient infection status.  Positive results do not rule out bacterial infection or co-infection  with other viruses. False positive results are rare but can occur, and confirmatory RT-PCR testing may be appropriate in some circumstances. The expected result is Negative.  Fact Sheet for Patients: PodPark.tn Fact Sheet for Providers: GiftContent.is   This test is not yet approved or cleared by the [place] FDA and  has been authorized for detection and/or diagnosis of SARS-CoV-2 by FDA under an Emergency Use Authorization (EUA).  This EUA will remain in effect (meaning this test can be  used) for the duration of  the COVID-19 declaration under Section 564(b)(1) of the Act, 21 U.S.C. section 360bbb-3(b)(1), unless  the authorization is terminated or revoked sooner.      Current Facility-Administered Medications  Medication Dose Route Frequency Provider Last Rate Last Admin  . acetaminophen (TYLENOL) tablet 1,000 mg  1,000 mg Oral STAT Delman Kitten, MD      . ARIPiprazole (ABILIFY) tablet 20 mg  20 mg Oral Daily Delman Kitten, MD   20 mg at 11/19/20 1558  . ibuprofen (ADVIL) tablet 800 mg  800 mg Oral Q8H PRN Delman Kitten, MD   800 mg at 11/19/20 1558  . LORazepam (ATIVAN) tablet 1 mg  1 mg Oral Q6H PRN Delman Kitten, MD      . traZODone (DESYREL) tablet 100 mg  100 mg Oral QHS Estanislao Harmon, [name], MD       No current outpatient medications on file.    Musculoskeletal: Strength & Muscle Tone: within normal limits Gait & Station: normal Patient leans: N/A  Psychiatric Specialty Exam: Physical Exam Vitals and nursing note reviewed.  Constitutional:      Appearance: He is well-developed and well-nourished. He is ill-appearing.  HENT:     Head: Normocephalic and atraumatic.  Eyes:     Conjunctiva/sclera: Conjunctivae normal.     Pupils: Pupils are equal, round, and reactive to light.  Cardiovascular:     Heart sounds: Normal heart sounds.  Pulmonary:     Effort: Pulmonary effort is normal.  Abdominal:     Palpations: Abdomen is soft.  Musculoskeletal:        General: Normal range of motion.     Cervical back: Normal range of motion.  Skin:    General: Skin is warm and dry.  Neurological:     General: No focal deficit present.     Mental Status: He is alert.  Psychiatric:        Attention and Perception: Attention normal. He perceives auditory hallucinations.        Mood and Affect: Mood is anxious and depressed.        Speech: Speech is delayed.        Behavior: Behavior is slowed.        Thought Content: Thought content is paranoid. Thought content includes  suicidal ideation.        Cognition and Memory: Cognition is impaired. Memory is impaired.        Judgment: Judgment is impulsive.     Review of Systems  Constitutional: Negative.   HENT: Negative.   Eyes: Negative.   Respiratory: Positive for cough and shortness of breath.   Cardiovascular: Negative.   Gastrointestinal: Negative.   Musculoskeletal: Negative.   Skin: Negative.   Neurological: Negative.   Psychiatric/Behavioral: Positive for confusion, dysphoric mood, hallucinations and suicidal ideas.    Blood pressure 132/76, pulse 76, temperature 99 F (37.2 C), temperature source Oral, resp. rate 18, height $RemoveBe'5\' 11"'HpkkArIbU$  (1.803 m), weight 90.7 kg, SpO2 100 %.Body mass index is 27.89 kg/m.  General Appearance: Disheveled  Eye Contact:  Minimal  Speech:  Slow  Volume:  Decreased  Mood:  Depressed  Affect:  Congruent  Thought Process:  Goal Directed  Orientation:  Full (Time, Place, and Person)  Thought Content:  Hallucinations: Auditory  Suicidal Thoughts:  Yes.  with intent/plan  Homicidal Thoughts:  No  Memory:  Immediate;   Fair Recent;   Fair Remote;   Fair  Judgement:  Impaired  Insight:  Shallow  Psychomotor Activity:  Decreased  Concentration:  Concentration: Poor  Recall:  [employer] of Knowledge:  Fair  Language:  Fair  Akathisia:  No  Handed:  Right  AIMS (if indicated):     Assets:  Desire for Improvement Housing  ADL's:  Impaired  Cognition:  Impaired,  Mild  Sleep:        Treatment Plan Summary: Medication management and Plan Patient tested positive for COVID and is currently having multiple symptoms of COVID-19 including coughing and congestion and shortness of breath fever general malaise and body aches.  He is on quarantine isolation from the patient population.  I will restart his Abilify not yet at the long-acting shot but 20 mg a day.  Patient informed that it is unlikely that he can be admitted until 5 days of quarantine or.  We will reevaluate in  the meantime to see if he is still feeling as bad and needs hospitalization.  Disposition: Recommend psychiatric Inpatient admission when medically cleared. Supportive therapy provided about ongoing stressors.  Alethia Berthold, MD 11/19/2020 4:39 PM

## 2020-11-20 LAB — URINE DRUG SCREEN, QUALITATIVE (ARMC ONLY)
Amphetamines, Ur Screen: NOT DETECTED
Barbiturates, Ur Screen: NOT DETECTED
Benzodiazepine, Ur Scrn: NOT DETECTED
Cannabinoid 50 Ng, Ur ~~LOC~~: POSITIVE — AB
Cocaine Metabolite,Ur ~~LOC~~: POSITIVE — AB
MDMA (Ecstasy)Ur Screen: NOT DETECTED
Methadone Scn, Ur: NOT DETECTED
Opiate, Ur Screen: NOT DETECTED
Phencyclidine (PCP) Ur S: NOT DETECTED
Tricyclic, Ur Screen: NOT DETECTED

## 2020-11-20 NOTE — ED Notes (Signed)
Pt c/o of body aches, given ibuprofen to help. Coughing during assessment, encouraged to cover his mouth as much as possible. Provided face mask to use when staff in room. Up to restroom, vss on assessment. Pt states "I feel bad", groaning when asked how he is doing. Reports he did not sleep well. Encouraged to drink fluids as much as possible.

## 2020-11-20 NOTE — ED Provider Notes (Signed)
Emergency Medicine Observation Re-evaluation Note  Dominic [name] is a 45 y.o. male, seen on rounds today.  Pt initially presented to the ED for complaints of Hallucinations and Addiction Problem Currently, the patient is calm, resting, in NAD.  Physical Exam  BP 122/84 (BP Location: Right Arm)   Pulse 72   Temp 98.4 F (36.9 C) (Oral)   Resp 18   Ht 5\' 11"  (1.803 m)   Wt 90.7 kg   SpO2 98%   BMI 27.89 kg/m  Physical Exam General: Calm resting Cardiac: well perfused Lungs: normal wob Psych: calm  ED Course / MDM  EKG:EKG Interpretation  Date/Time:  Thursday November 18 2020 18:23:54 EST Ventricular Rate:  83 PR Interval:  166 QRS Duration: 102 QT Interval:  370 QTC Calculation: 434 R Axis:   15 Text Interpretation: Normal sinus rhythm Right atrial enlargement Borderline ECG Confirmed by UNCONFIRMED, DOCTOR (04-26-1968), editor [id number], Tammy [phone]) on 11/19/2020 9:36:04 AM    I have reviewed the labs performed to date as well as medications administered while in observation.  Recent changes in the last 24 hours include positive COVID test.  Plan  Current plan is for psych disposition and placement. COVID+ so limiting placement. Patient is under full IVC at this time.   01/17/2021, MD 11/20/20 [phone]

## 2020-11-20 NOTE — ED Notes (Signed)
Pt reports he needs help with substance abuse treatment and was hoping to talk with Dr Toni Amend about it. Advised the psychiatrist will be here on Monday.

## 2020-11-20 NOTE — ED Notes (Signed)
Given urine cup to provide sample, awaiting sample from pt

## 2020-11-21 NOTE — ED Notes (Signed)
Unable to obtain vitals due to patient sleeping. Will continue to monitor.   

## 2020-11-21 NOTE — ED Provider Notes (Signed)
Emergency Medicine Observation Re-evaluation Note  Dominic [name] is a 45 y.o. male, seen on rounds today.  Pt initially presented to the ED for complaints of Hallucinations and Addiction Problem Currently, the patient is resting.  Physical Exam  BP 123/78 (BP Location: Right Arm)   Pulse 62   Temp 98.2 F (36.8 C) (Oral)   Resp 18   Ht 5\' 11"  (1.803 m)   Wt 90.7 kg   SpO2 100%   BMI 27.89 kg/m  Physical Exam General: nontoxic Cardiac: well perfused Lungs: unlabored Psych: resting, calm  ED Course / MDM  EKG:EKG Interpretation  Date/Time:  Thursday November 18 2020 18:23:54 EST Ventricular Rate:  83 PR Interval:  166 QRS Duration: 102 QT Interval:  370 QTC Calculation: 434 R Axis:   15 Text Interpretation: Normal sinus rhythm Right atrial enlargement Borderline ECG Confirmed by UNCONFIRMED, DOCTOR (04-26-1968), editor [id number], Tammy [phone]) on 11/19/2020 9:36:04 AM    I have reviewed the labs performed to date as well as medications administered while in observation.  Recent changes in the last 24 hours include none.  Plan  Current plan is for psych dispo. Patient is under full IVC at this time.   01/17/2021, MD 11/21/20 [phone]

## 2020-11-21 NOTE — ED Notes (Signed)
Patient requested a [place] tray.

## 2020-11-21 NOTE — ED Notes (Signed)
Gave pt phone he had a phone call.

## 2020-11-21 NOTE — ED Notes (Signed)
Patient sleeping, vital signs deferred. 

## 2020-11-21 NOTE — ED Notes (Signed)
Patient is IVC pending placement 

## 2020-11-22 DIAGNOSIS — F203 Undifferentiated schizophrenia: Secondary | ICD-10-CM | POA: Diagnosis not present

## 2020-11-22 MED ORDER — BENZONATATE 100 MG PO CAPS
100.0000 mg | ORAL_CAPSULE | Freq: Three times a day (TID) | ORAL | Status: DC | PRN
  Administered 2020-11-22: 100 mg via ORAL
  Filled 2020-11-22 (×3): qty 1

## 2020-11-22 NOTE — ED Notes (Signed)
Report to include situation, background, assessment and recommendations from Amy RN. Patient sleeping, respirations regular and unlabored. Q15 minute rounds and security camera observation to continue.    

## 2020-11-22 NOTE — ED Notes (Signed)
RN explained to patient he will likely be transferred to [facility] for admission after his quarantine is complete. Pt accepting.

## 2020-11-22 NOTE — ED Notes (Signed)
Hourly rounding reveals patient in room. No complaints, stable, in no acute distress. Q15 minute rounds and monitoring via Security Cameras to continue. 

## 2020-11-22 NOTE — Consult Note (Signed)
[facility] Face-to-Face Psychiatry Consult   Reason for Consult: Follow-up consult 45 year old man with a history of schizophrenia and substance abuse Referring Physician: Katrinka Blazing Patient Identification: Dominic [name] MRN:  [id number] Principal Diagnosis: Schizophrenia ([facility]) Diagnosis:  Principal Problem:   Schizophrenia ([facility]) Active Problems:   Psychosis ([facility])   Alcohol abuse   Cocaine abuse ([facility])   Nicotine dependence, cigarettes, uncomplicated   Alcohol intoxication, uncomplicated ([facility])   Schizophrenia, chronic condition ([facility])   COVID-19   Total Time spent with patient: 30 minutes  Subjective:   Dominic [name] is a 45 y.o. male patient admitted with "I am still not good".  HPI: Follow-up for this patient came into the hospital complaining of hallucinations dysphoric mood and suicidal ideation admitting to a relapse on cocaine use.  Found to be COVID-positive and has been symptomatic with cough aches and pains general malaise.  Today he is 4 days out from his positive test and still having symptoms and feeling bad.  Also still has passive suicidal thoughts.  Hallucinations improved.  Asking strongly today for hospitalization for substance abuse  Past Psychiatric History: See previous  Risk to Self:   Risk to Others:   Prior Inpatient Therapy:   Prior Outpatient Therapy:    Past Medical History:  Past Medical History:  Diagnosis Date  . Anxiety   . Asthma   . Bipolar 1 disorder ([facility])   . Chronic hepatitis C without hepatic coma ([facility]) 05/28/2018  . Depression   . Hepatitis C   . Hepatitis C antibody positive in blood 05/28/2018  . Schizophrenia ([facility])   . Sleep apnea     Past Surgical History:  Procedure Laterality Date  . CYST REMOVAL NECK     neck   Family History:  Family History  Problem Relation Age of Onset  . Hypertension Sister   . Schizophrenia Sister    Family Psychiatric  History: Also see previous Social History:  Social History   Substance and  Sexual Activity  Alcohol Use Yes  . Alcohol/week: 3.0 standard drinks  . Types: 3 Shots of liquor per week     Social History   Substance and Sexual Activity  Drug Use Yes  . Types: Cocaine, Marijuana   Comment: last used today    Social History   Socioeconomic History  . Marital status: Single    Spouse name: Not on file  . Number of children: Not on file  . Years of education: Not on file  . Highest education level: Not on file  Occupational History  . Not on file  Tobacco Use  . Smoking status: Current Some Day Smoker    Packs/day: 1.00    Types: Cigarettes  . Smokeless tobacco: Never Used  Vaping Use  . Vaping Use: Never used  Substance and Sexual Activity  . Alcohol use: Yes    Alcohol/week: 3.0 standard drinks    Types: 3 Shots of liquor per week  . Drug use: Yes    Types: Cocaine, Marijuana    Comment: last used today  . Sexual activity: Not Currently  Other Topics Concern  . Not on file  Social History Narrative  . Not on file   Social Determinants of Health   Financial Resource Strain: Not on file  Food Insecurity: Not on file  Transportation Needs: Not on file  Physical Activity: Not on file  Stress: Not on file  Social Connections: Not on file   Additional Social History:    Allergies:  No Known Allergies  Labs: No results found for this or any previous visit (from the past 48 hour(s)).  Current Facility-Administered Medications  Medication Dose Route Frequency Provider Last Rate Last Admin  . ARIPiprazole (ABILIFY) tablet 20 mg  20 mg Oral Daily Sharyn Creamer, MD   20 mg at 11/21/20 1025  . ibuprofen (ADVIL) tablet 800 mg  800 mg Oral Q8H PRN Sharyn Creamer, MD   800 mg at 11/21/20 1025  . LORazepam (ATIVAN) tablet 1 mg  1 mg Oral Q6H PRN Sharyn Creamer, MD      . traZODone (DESYREL) tablet 100 mg  100 mg Oral QHS Cyrah Mclamb, Jackquline Denmark, MD   100 mg at 11/21/20 2134   No current outpatient medications on file.    Musculoskeletal: Strength & Muscle  Tone: within normal limits Gait & Station: normal Patient leans: N/A  Psychiatric Specialty Exam: Physical Exam Vitals and nursing note reviewed.  Constitutional:      Appearance: He is well-developed and well-nourished. He is ill-appearing.  HENT:     Head: Normocephalic and atraumatic.  Eyes:     Conjunctiva/sclera: Conjunctivae normal.     Pupils: Pupils are equal, round, and reactive to light.  Cardiovascular:     Heart sounds: Normal heart sounds.  Pulmonary:     Effort: Pulmonary effort is normal.  Abdominal:     Palpations: Abdomen is soft.  Musculoskeletal:        General: Normal range of motion.     Cervical back: Normal range of motion.  Skin:    General: Skin is warm and dry.  Neurological:     General: No focal deficit present.     Mental Status: He is alert.  Psychiatric:        Attention and Perception: Attention normal.        Mood and Affect: Mood is depressed. Affect is blunt.        Speech: Speech is delayed.        Behavior: Behavior is withdrawn.        Thought Content: Thought content includes suicidal ideation. Thought content does not include suicidal plan.        Judgment: Judgment is impulsive.     Review of Systems  Constitutional: Positive for fatigue.  HENT: Negative.   Eyes: Negative.   Respiratory: Positive for cough.   Cardiovascular: Negative.   Gastrointestinal: Negative.   Musculoskeletal: Negative.   Skin: Negative.   Neurological: Negative.   Psychiatric/Behavioral: Positive for dysphoric mood and suicidal ideas.    Blood pressure 129/83, pulse 75, temperature 98.5 F (36.9 C), temperature source Oral, resp. rate 18, height 5\' 11"  (1.803 m), weight 90.7 kg, SpO2 96 %.Body mass index is 27.89 kg/m.  General Appearance: Casual  Eye Contact:  Good  Speech:  Slow  Volume:  Decreased  Mood:  Dysphoric  Affect:  Constricted  Thought Process:  Coherent  Orientation:  Full (Time, Place, and Person)  Thought Content:  Logical and  Hallucinations: Auditory  Suicidal Thoughts:  Yes.  without intent/plan  Homicidal Thoughts:  No  Memory:  Immediate;   Fair Recent;   Fair Remote;   Fair  Judgement:  Impaired  Insight:  Shallow  Psychomotor Activity:  Decreased  Concentration:  Concentration: Fair  Recall:  of Knowledge:  Fair  Language:  Fair  Akathisia:  No  Handed:  Right  AIMS (if indicated):     Assets:  Desire for Improvement Resilience  ADL's:  Impaired  Cognition:  Impaired,  Mild  Sleep:        Treatment Plan Summary: Plan Continue psychiatric medicine and symptomatic treatment of COVID.  Continue to monitor symptoms.  Continues to meet criteria for psychiatric hospitalization once through with quarantine  Disposition: Recommend psychiatric Inpatient admission when medically cleared.  Mordecai Rasmussen, MD 11/22/2020 4:23 PM

## 2020-11-22 NOTE — ED Notes (Signed)
IVC/  PENDING  PLACEMENT 

## 2020-11-22 NOTE — ED Notes (Signed)
Report to include Situation, Background, Assessment, and Recommendations received from Amy RN. Patient alert and oriented, warm and dry, in no acute distress. Patient denies SI, HI, AVH and pain. Patient made aware of Q15 minute rounds and security cameras for their safety. Patient instructed to come to me with needs or concerns.  

## 2020-11-22 NOTE — ED Provider Notes (Signed)
Emergency Medicine Observation Re-evaluation Note  Dominic [name] is a 45 y.o. male, seen on rounds today.  Pt initially presented to the ED for complaints of Hallucinations and Addiction Problem Currently, the patient is resting.  Physical Exam  BP (!) 130/101 (BP Location: Left Arm)   Pulse (!) 106   Temp 98.2 F (36.8 C) (Oral)   Resp 18   Ht 5\' 11"  (1.803 m)   Wt 90.7 kg   SpO2 97%   BMI 27.89 kg/m  Physical Exam Constitutional:      Appearance: He is not ill-appearing or toxic-appearing.  HENT:     Head: Atraumatic.  Eyes:     Extraocular Movements: Extraocular movements intact.     Pupils: Pupils are equal, round, and reactive to light.  Pulmonary:     Effort: Pulmonary effort is normal.  Abdominal:     General: There is no distension.  Musculoskeletal:        General: No deformity.  Skin:    General: Skin is warm and dry.  Neurological:     General: No focal deficit present.     Cranial Nerves: No cranial nerve deficit.      ED Course / MDM  EKG:EKG Interpretation  Date/Time:  Thursday November 18 2020 18:23:54 EST Ventricular Rate:  83 PR Interval:  166 QRS Duration: 102 QT Interval:  370 QTC Calculation: 434 R Axis:   15 Text Interpretation: Normal sinus rhythm Right atrial enlargement Borderline ECG Confirmed by UNCONFIRMED, DOCTOR (04-26-1968), editor [id number], Tammy [phone]) on 11/19/2020 9:36:04 AM    I have reviewed the labs performed to date as well as medications administered while in observation.  Recent changes in the last 24 hours include none.  Plan  Current plan is for inpatient psychiatric care. Patient is under full IVC at this time.   01/17/2021, MD 11/22/20 [id number]

## 2020-11-22 NOTE — ED Notes (Signed)
Unable to obtain vitals due to patient sleeping. Will continue to monitor.   

## 2020-11-23 MED ORDER — ARIPIPRAZOLE 10 MG PO TABS: 20.0000 mg | ORAL_TABLET | Freq: Every day | ORAL | 1 refills | Status: DC

## 2020-11-23 MED ORDER — TRAZODONE HCL 100 MG PO TABS: 100.0000 mg | ORAL_TABLET | Freq: Every day | ORAL | 1 refills | Status: DC

## 2020-11-23 NOTE — ED Notes (Signed)
Hourly rounding reveals patient in room. No complaints, stable, in no acute distress. Q15 minute rounds and monitoring via Security Cameras to continue. 

## 2020-11-23 NOTE — ED Provider Notes (Signed)
-----------------------------------------   10:37 AM on 11/23/2020 -----------------------------------------  Dr. Toni Amend has evaluated the patient today and advises that he is appropriate for discharge home.  He has rescinded the IVC.  The patient will follow-up with [facility].  On reassessment, he is comfortable appearing.  He is stable for discharge at this time return precautions given, and the patient expresses understanding.   Dionne Bucy, MD 11/23/20 1038

## 2020-11-23 NOTE — Discharge Instructions (Addendum)
Follow-up with your regular psychiatrist.  Continue taking your medications as prescribed.  Return to the ER for any new or worsening symptoms that concern you such as new or worsening suicidal thoughts, or any thoughts of wanting to hurt yourself or anyone else.  You should also return for any new or worsening COVID symptoms including shortness of breath, weakness, vomiting, high fever, confusion, or any other symptoms that concern you.

## 2020-11-23 NOTE — ED Notes (Signed)
Pt discharging home. Discharge teaching done and prescriptions reviewed and pt verbalized understanding. Given [facility] liaison contact information per psychiatry.  Personal belongings given to pt. Escorted to lobby, A&O x3, ambulatory with steady gait and in NAD.

## 2020-11-23 NOTE — ED Notes (Signed)
VS not taken, patient asleep 

## 2020-11-23 NOTE — Consult Note (Signed)
[facility] Face-to-Face Psychiatry Consult   Reason for Consult: Follow-up consult for 45 year old man with schizophrenia and substance abuse Referring Physician: Siadecki Patient Identification: Dominic [name] MRN:  [id number] Principal Diagnosis: Schizophrenia (HCC) Diagnosis:  Principal Problem:   Schizophrenia (HCC) Active Problems:   Psychosis (HCC)   Alcohol abuse   Cocaine abuse (HCC)   Nicotine dependence, cigarettes, uncomplicated   Alcohol intoxication, uncomplicated (HCC)   Schizophrenia, chronic condition (HCC)   COVID-19   Total Time spent with patient: 30 minutes  Subjective:   Dominic [name] is a 45 y.o. male patient admitted with "I'm feeling a lot better".  HPI: Patient seen chart reviewed.  Patient reports that today he is feeling much better.  His symptoms of flulike aches and pains and cough are entirely resolved.  He is no longer feeling acutely sick.  His mood is much improved.  He denies any suicidal thoughts denies any homicidal thoughts denies any hallucinations.  Patient was given the option to consider what he needs most in treatment and is requesting to be discharged.  Agrees to follow-up at [facility].  Past Psychiatric History: Past history of longstanding problems with cocaine abuse and mental health problems as noted previously  Risk to Self:   Risk to Others:   Prior Inpatient Therapy:   Prior Outpatient Therapy:    Past Medical History:  Past Medical History:  Diagnosis Date  . Anxiety   . Asthma   . Bipolar 1 disorder (HCC)   . Chronic hepatitis C without hepatic coma (HCC) 05/28/2018  . Depression   . Hepatitis C   . Hepatitis C antibody positive in blood 05/28/2018  . Schizophrenia (HCC)   . Sleep apnea     Past Surgical History:  Procedure Laterality Date  . CYST REMOVAL NECK     neck   Family History:  Family History  Problem Relation Age of Onset  . Hypertension Sister   . Schizophrenia Sister    Family Psychiatric  History: See  previous.  At least 1 relative with schizophrenia Social History:  Social History   Substance and Sexual Activity  Alcohol Use Yes  . Alcohol/week: 3.0 standard drinks  . Types: 3 Shots of liquor per week     Social History   Substance and Sexual Activity  Drug Use Yes  . Types: Cocaine, Marijuana   Comment: last used today    Social History   Socioeconomic History  . Marital status: Single    Spouse name: Not on file  . Number of children: Not on file  . Years of education: Not on file  . Highest education level: Not on file  Occupational History  . Not on file  Tobacco Use  . Smoking status: Current Some Day Smoker    Packs/day: 1.00    Types: Cigarettes  . Smokeless tobacco: Never Used  Vaping Use  . Vaping Use: Never used  Substance and Sexual Activity  . Alcohol use: Yes    Alcohol/week: 3.0 standard drinks    Types: 3 Shots of liquor per week  . Drug use: Yes    Types: Cocaine, Marijuana    Comment: last used today  . Sexual activity: Not Currently  Other Topics Concern  . Not on file  Social History Narrative  . Not on file   Social Determinants of Health   Financial Resource Strain: Not on file  Food Insecurity: Not on file  Transportation Needs: Not on file  Physical Activity: Not on file  Stress: Not on file  Social Connections: Not on file   Additional Social History:    Allergies:  No Known Allergies  Labs: No results found for this or any previous visit (from the past 48 hour(s)).  Current Facility-Administered Medications  Medication Dose Route Frequency Provider Last Rate Last Admin  . ARIPiprazole (ABILIFY) tablet 20 mg  20 mg Oral Daily Sharyn Creamer, MD   20 mg at 11/23/20 0940  . benzonatate (TESSALON) capsule 100 mg  100 mg Oral TID PRN Phineas Semen, MD   100 mg at 11/22/20 2232  . ibuprofen (ADVIL) tablet 800 mg  800 mg Oral Q8H PRN Sharyn Creamer, MD   800 mg at 11/23/20 0941  . LORazepam (ATIVAN) tablet 1 mg  1 mg Oral Q6H PRN  Sharyn Creamer, MD   1 mg at 11/22/20 2232  . traZODone (DESYREL) tablet 100 mg  100 mg Oral QHS Ilena Dieckman, Jackquline Denmark, MD   100 mg at 11/21/20 2134   Current Outpatient Medications  Medication Sig Dispense Refill  . [START ON 11/24/2020] ARIPiprazole (ABILIFY) 10 MG tablet Take 2 tablets (20 mg total) by mouth daily. 30 tablet 1  . traZODone (DESYREL) 100 MG tablet Take 1 tablet (100 mg total) by mouth at bedtime. 30 tablet 1    Musculoskeletal: Strength & Muscle Tone: within normal limits Gait & Station: normal Patient leans: N/A  Psychiatric Specialty Exam: Physical Exam Vitals and nursing note reviewed.  Constitutional:      Appearance: He is well-developed and well-nourished.  HENT:     Head: Normocephalic and atraumatic.  Eyes:     Conjunctiva/sclera: Conjunctivae normal.     Pupils: Pupils are equal, round, and reactive to light.  Cardiovascular:     Heart sounds: Normal heart sounds.  Pulmonary:     Effort: Pulmonary effort is normal.  Abdominal:     Palpations: Abdomen is soft.  Musculoskeletal:        General: Normal range of motion.     Cervical back: Normal range of motion.  Skin:    General: Skin is warm and dry.  Neurological:     General: No focal deficit present.     Mental Status: He is alert.  Psychiatric:        Mood and Affect: Mood normal.        Thought Content: Thought content normal.     Review of Systems  Constitutional: Negative.   HENT: Negative.   Eyes: Negative.   Respiratory: Negative.   Cardiovascular: Negative.   Gastrointestinal: Negative.   Musculoskeletal: Negative.   Skin: Negative.   Neurological: Negative.   Psychiatric/Behavioral: Negative.     Blood pressure 120/68, pulse 60, temperature 97.6 F (36.4 C), temperature source Oral, resp. rate 16, height 5\' 11"  (1.803 m), weight 90.7 kg, SpO2 98 %.Body mass index is 27.89 kg/m.  General Appearance: Casual  Eye Contact:  Good  Speech:  Clear and Coherent  Volume:  Normal  Mood:   Euthymic  Affect:  Congruent  Thought Process:  Goal Directed  Orientation:  Full (Time, Place, and Person)  Thought Content:  Logical  Suicidal Thoughts:  No  Homicidal Thoughts:  No  Memory:  Immediate;   Fair Recent;   Fair Remote;   Fair  Judgement:  Fair  Insight:  Fair  Psychomotor Activity:  Normal  Concentration:  Concentration: Fair  Recall:  of Knowledge:  Fair  Language:  Fair  Akathisia:  No  Handed:  Right  AIMS (if indicated):     Assets:  Desire for Improvement Housing Physical Health Resilience Social Support  ADL's:  Intact  Cognition:  WNL  Sleep:        Treatment Plan Summary: Medication management and Plan Patient appears to have resolved the most acute symptoms of his coronavirus and also is appearing to be back at his baseline mentally.  Affect and mood are reported as normal.  Thoughts are lucid and organized without any current hallucinations.  Patient is requesting to be discharged and agrees to follow-up at [facility].  Under the circumstances he no longer meets IVC criteria and I think it is perfectly reasonable to discharge him.  He has been educated that by current guidelines given that he is not vaccinated he really should quarantine for another 2 days even though he is feeling better.  Prescriptions are prepared for Abilify and trazodone.  Case reviewed with emergency room doctor.  Patient will follow-up with [facility] and was given a written reminder with [facility]'s information.  Disposition: No evidence of imminent risk to self or others at present.   Patient does not meet criteria for psychiatric inpatient admission. Supportive therapy provided about ongoing stressors. Discussed crisis plan, support from social network, calling 911, coming to the Emergency Department, and calling Suicide Hotline.  Mordecai Rasmussen, MD 11/23/2020 11:00 AM

## 2020-11-26 NOTE — Congregational Nurse Program (Signed)
Late Entry chart note on 11/26/20  for visit 11/24/20 at the [facility]. Client presents requesting clothing voucher...only has sweatshirt in upcoming winter weather. Recent hospitalization. He and partner are homeless...sometimes he sleeps on the floor of a friend's family's home. "I'm cold." client has insurance and PC is Dr. Dossie Arbour, [facility], Cheree Ditto. Admits to be smoker but isn't ready to quit. Completed clothing voucher referral to [employer]. Provided socks.  Reinforced access to food pantry. Co. Re: ongoing availability to follow up in this clinic.

## 2021-01-11 ENCOUNTER — Emergency Department: Payer: Medicaid Other

## 2021-01-11 ENCOUNTER — Other Ambulatory Visit: Payer: Self-pay

## 2021-01-11 ENCOUNTER — Emergency Department
Admission: EM | Admit: 2021-01-11 | Discharge: 2021-01-12 | Disposition: A | Discharge status: Home / Self Care | Payer: Medicaid Other | Attending: Emergency Medicine | Admitting: Emergency Medicine

## 2021-01-11 DIAGNOSIS — B182 Chronic viral hepatitis C: Secondary | ICD-10-CM | POA: Diagnosis not present

## 2021-01-11 DIAGNOSIS — J45909 Unspecified asthma, uncomplicated: Secondary | ICD-10-CM | POA: Insufficient documentation

## 2021-01-11 DIAGNOSIS — Z79899 Other long term (current) drug therapy: Secondary | ICD-10-CM | POA: Insufficient documentation

## 2021-01-11 DIAGNOSIS — R059 Cough, unspecified: Secondary | ICD-10-CM | POA: Insufficient documentation

## 2021-01-11 DIAGNOSIS — E876 Hypokalemia: Secondary | ICD-10-CM

## 2021-01-11 DIAGNOSIS — R197 Diarrhea, unspecified: Secondary | ICD-10-CM | POA: Diagnosis not present

## 2021-01-11 DIAGNOSIS — F17210 Nicotine dependence, cigarettes, uncomplicated: Secondary | ICD-10-CM | POA: Insufficient documentation

## 2021-01-11 DIAGNOSIS — R112 Nausea with vomiting, unspecified: Secondary | ICD-10-CM | POA: Insufficient documentation

## 2021-01-11 LAB — CBC
HCT: 38.2 % — ABNORMAL LOW (ref 39.0–52.0)
Hemoglobin: 14.3 g/dL (ref 13.0–17.0)
MCH: 30.1 pg (ref 26.0–34.0)
MCHC: 37.4 g/dL — ABNORMAL HIGH (ref 30.0–36.0)
MCV: 80.4 fL (ref 80.0–100.0)
Platelets: 192 10*3/uL (ref 150–400)
RBC: 4.75 MIL/uL (ref 4.22–5.81)
RDW: 14 % (ref 11.5–15.5)
WBC: 9.7 10*3/uL (ref 4.0–10.5)
nRBC: 0 % (ref 0.0–0.2)

## 2021-01-11 LAB — BASIC METABOLIC PANEL
Anion gap: 6 (ref 5–15)
BUN: 12 mg/dL (ref 6–20)
CO2: 24 mmol/L (ref 22–32)
Calcium: 8.8 mg/dL — ABNORMAL LOW (ref 8.9–10.3)
Chloride: 108 mmol/L (ref 98–111)
Creatinine, Ser: 0.75 mg/dL (ref 0.61–1.24)
GFR, Estimated: >60 mL/min (ref >60)
Glucose, Bld: 124 mg/dL — ABNORMAL HIGH (ref 70–99)
Potassium: 3.2 mmol/L — ABNORMAL LOW (ref 3.5–5.1)
Sodium: 138 mmol/L (ref 135–145)

## 2021-01-11 LAB — TROPONIN I (HIGH SENSITIVITY)
Troponin I (High Sensitivity): 5 ng/L (ref <18)
Troponin I (High Sensitivity): 5 ng/L (ref <18)

## 2021-01-11 MED ORDER — SODIUM CHLORIDE 0.9 % IV BOLUS
1000.0000 mL | Freq: Once | INTRAVENOUS | Status: AC
  Administered 2021-01-12: 1000 mL via INTRAVENOUS

## 2021-01-11 MED ORDER — POTASSIUM CHLORIDE 20 MEQ PO PACK
40.0000 meq | PACK | Freq: Once | ORAL | Status: AC
  Administered 2021-01-12: 40 meq via ORAL
  Filled 2021-01-11: qty 2

## 2021-01-11 MED ORDER — ONDANSETRON HCL 4 MG/2ML IJ SOLN
4.0000 mg | Freq: Once | INTRAMUSCULAR | Status: AC
  Administered 2021-01-12: 4 mg via INTRAVENOUS
  Filled 2021-01-11: qty 2

## 2021-01-11 NOTE — ED Triage Notes (Signed)
Pt states 2 days ago he started with a cough, chest pain, headache and abdominal pain. Pt states he had covid at the start of the year. Pt states he also had nausea, vomiting and diarrhea.

## 2021-01-11 NOTE — ED Provider Notes (Signed)
[facility] Emergency Department Provider Note   ____________________________________________   Event Date/Time   First MD Initiated Contact with Patient 01/11/21 2350     (approximate)  I have reviewed the triage vital signs and the nursing notes.   HISTORY  Chief Complaint Abdominal Pain, Chest Pain, and Cough    HPI Dominic [name] is a 45 y.o. male who presents to the ED from home with a chief complaint of cough, chest pain, abdominal pain, nausea/vomiting/diarrhea.  Symptoms x2 days.  Patient tested positive for COVID-19 on 11/18/2020.  Denies fever, chills, dysuria.  At the time of our interview and examination, patient is feeling better with resolved abdominal pain and asking for something to drink.     Past Medical History:  Diagnosis Date  . Anxiety   . Asthma   . Bipolar 1 disorder (HCC)   . Chronic hepatitis C without hepatic coma (HCC) 05/28/2018  . Depression   . Hepatitis C   . Hepatitis C antibody positive in blood 05/28/2018  . Schizophrenia (HCC)   . Sleep apnea     Patient Active Problem List   Diagnosis Date Noted  . COVID-19 11/19/2020  . Schizophrenia (HCC) 05/22/2019  . Schizophrenia, chronic condition (HCC) 01/14/2019  . Alcohol intoxication, uncomplicated (HCC) 01/13/2019  . Asthma 11/25/2018  . Nicotine dependence, cigarettes, uncomplicated 11/25/2018  . Chronic hepatitis C without hepatic coma (HCC) 05/28/2018  . Psychosis (HCC) 02/16/2016  . Alcohol abuse 02/16/2016  . Cocaine abuse (HCC) 02/16/2016    Past Surgical History:  Procedure Laterality Date  . CYST REMOVAL NECK     neck    Prior to Admission medications   Medication Sig Start Date End Date Taking? Authorizing Provider  ondansetron (ZOFRAN ODT) 4 MG disintegrating tablet Take 1 tablet (4 mg total) by mouth every 8 (eight) hours as needed for nausea or vomiting. 01/12/21  Yes Irean Hong, MD  ARIPiprazole (ABILIFY) 10 MG tablet Take 2 tablets (20 mg  total) by mouth daily. 11/24/20   Clapacs, Jackquline Denmark, MD  traZODone (DESYREL) 100 MG tablet Take 1 tablet (100 mg total) by mouth at bedtime. 11/23/20   Clapacs, Jackquline Denmark, MD    Allergies Patient has no known allergies.  Family History  Problem Relation Age of Onset  . Hypertension Sister   . Schizophrenia Sister     Social History Social History   Tobacco Use  . Smoking status: Current Some Day Smoker    Packs/day: 1.00    Types: Cigarettes  . Smokeless tobacco: Never Used  Vaping Use  . Vaping Use: Never used  Substance Use Topics  . Alcohol use: Yes    Alcohol/week: 3.0 standard drinks    Types: 3 Shots of liquor per week  . Drug use: Yes    Types: Cocaine, Marijuana    Comment: last used today    Review of Systems  Constitutional: No fever/chills Eyes: No visual changes. ENT: No sore throat. Cardiovascular: Positive for chest pain. Respiratory: Positive for cough.  Denies shortness of breath. Gastrointestinal: Positive for abdominal pain, nausea, vomiting and diarrhea.  No constipation. Genitourinary: Negative for dysuria. Musculoskeletal: Negative for back pain. Skin: Negative for rash. Neurological: Negative for headaches, focal weakness or numbness.   ____________________________________________   PHYSICAL EXAM:  VITAL SIGNS: ED Triage Vitals  Enc Vitals Group     BP 01/11/21 1941 124/77     Pulse Rate 01/11/21 1941 82     Resp 01/11/21 1941 20  Temp 01/11/21 1941 98 F (36.7 C)     Temp src --      SpO2 01/11/21 1941 95 %     Weight 01/11/21 1942 212 lb (96.2 kg)     Height 01/11/21 1942 6' (1.829 m)     Head Circumference --      Peak Flow --      Pain Score 01/11/21 1942 10     Pain Loc --      Pain Edu? --      Excl. in [facility]? --     Constitutional: Alert and oriented. Well appearing and in no acute distress. Eyes: Conjunctivae are normal. PERRL. EOMI. Head: Atraumatic. Nose: No congestion/rhinnorhea. Mouth/Throat: Mucous membranes are  moist.   Neck: No stridor.   Cardiovascular: Normal rate, regular rhythm. Grossly normal heart sounds.  Good peripheral circulation. Respiratory: Normal respiratory effort.  No retractions. Lungs CTAB. Gastrointestinal: Soft and nontender to light or deep palpation. No distention. No abdominal bruits. No CVA tenderness. Musculoskeletal: No lower extremity tenderness nor edema.  No joint effusions. Neurologic:  Normal speech and language. No gross focal neurologic deficits are appreciated. No gait instability. Skin:  Skin is warm, dry and intact. No rash noted.  No petechiae. Psychiatric: Mood and affect are normal. Speech and behavior are normal.  ____________________________________________   LABS (all labs ordered are listed, but only abnormal results are displayed)  Labs Reviewed  BASIC METABOLIC PANEL - Abnormal; Notable for the following components:      Result Value   Potassium 3.2 (*)    Glucose, Bld 124 (*)    Calcium 8.8 (*)    All other components within normal limits  CBC - Abnormal; Notable for the following components:   HCT 38.2 (*)    MCHC 37.4 (*)    All other components within normal limits  HEPATIC FUNCTION PANEL - Abnormal; Notable for the following components:   Total Protein 8.2 (*)    All other components within normal limits  LIPASE, BLOOD  TROPONIN I (HIGH SENSITIVITY)  TROPONIN I (HIGH SENSITIVITY)   ____________________________________________  EKG  ED ECG REPORT I, Karion Cudd J, the attending physician, personally viewed and interpreted this ECG.   Date: 01/11/2021  EKG Time: 1935  Rate: 76  Rhythm: normal EKG, normal sinus rhythm  Axis: Normal  Intervals:none  ST&T Change: Nonspecific  ____________________________________________  RADIOLOGY I, Laiana Fratus J, personally viewed and evaluated these images (plain radiographs) as part of my medical decision making, as well as reviewing the written report by the radiologist.  ED MD  interpretation: No acute cardiopulmonary process  Official radiology report(s): DG Chest 2 View  Result Date: 01/11/2021 CLINICAL DATA:  Chest pain EXAM: CHEST - 2 VIEW COMPARISON:  None. FINDINGS: The heart size and mediastinal contours are within normal limits. Both lungs are clear. The visualized skeletal structures are unremarkable. IMPRESSION: No active cardiopulmonary disease. Electronically Signed   By: Jonna Clark M.D.   On: 01/11/2021 20:08    ____________________________________________   PROCEDURES  Procedure(s) performed (including Critical Care):  Procedures   ____________________________________________   INITIAL IMPRESSION / ASSESSMENT AND PLAN / ED COURSE  As part of my medical decision making, I reviewed the following data within the electronic MEDICAL RECORD NUMBER Nursing notes reviewed and incorporated, Labs reviewed, EKG interpreted, Old chart reviewed, Radiograph reviewed and Notes from prior ED visits     45 year old male presenting with cough, nausea/vomiting/diarrhea, less than 90 days from COVID-19 diagnosis. Differential diagnosis includes, but is  not limited to, acute appendicitis, renal colic, testicular torsion, urinary tract infection/pyelonephritis, prostatitis,  epididymitis, diverticulitis, small bowel obstruction or ileus, colitis, abdominal aortic aneurysm, gastroenteritis, hernia, etc.  Laboratory results notable for mild hypokalemia.  Will check LFTs/lipase.  Initiate IV fluid hydration, potassium replacement, IV Zofran and reassess.  Clinical Course as of 01/12/21 0329  Wed Jan 12, 2021  0130 Patient feeling significantly better and able to tolerate PO.  Will discharge home with prescription for Zofran to use as needed.  Strict return precautions given.  Patient verbalizes understanding agrees with plan of care. [JS]    Clinical Course User Index [JS] Irean Hong, MD     ____________________________________________   FINAL CLINICAL  IMPRESSION(S) / ED DIAGNOSES  Final diagnoses:  Nausea vomiting and diarrhea  Cough  Hypokalemia     ED Discharge Orders         Ordered    ondansetron (ZOFRAN ODT) 4 MG disintegrating tablet  Every 8 hours PRN        01/12/21 0130          *Please note:  Tomoya Amaris Garrette was evaluated in Emergency Department on 01/12/2021 for the symptoms described in the history of present illness. He was evaluated in the context of the global COVID-19 pandemic, which necessitated consideration that the patient might be at risk for infection with the SARS-CoV-2 virus that causes COVID-19. Institutional protocols and algorithms that pertain to the evaluation of patients at risk for COVID-19 are in a state of rapid change based on information released by regulatory bodies including the CDC and federal and state organizations. These policies and algorithms were followed during the patient's care in the ED.  Some ED evaluations and interventions may be delayed as a result of limited staffing during and the pandemic.*   Note:  This document was prepared using Dragon voice recognition software and may include unintentional dictation errors.   Irean Hong, MD 01/12/21 0330

## 2021-01-12 LAB — HEPATIC FUNCTION PANEL
ALT: 36 U/L (ref 0–44)
AST: 40 U/L (ref 15–41)
Albumin: 4.2 g/dL (ref 3.5–5.0)
Alkaline Phosphatase: 38 U/L (ref 38–126)
Bilirubin, Direct: <0.1 mg/dL (ref 0.0–0.2)
Total Bilirubin: 0.6 mg/dL (ref 0.3–1.2)
Total Protein: 8.2 g/dL — ABNORMAL HIGH (ref 6.5–8.1)

## 2021-01-12 LAB — LIPASE, BLOOD: Lipase: 30 U/L (ref 11–51)

## 2021-01-12 MED ORDER — ONDANSETRON 4 MG PO TBDP: 4.0000 mg | ORAL_TABLET | Freq: Three times a day (TID) | ORAL | 0 refills | Status: DC | PRN

## 2021-01-12 NOTE — Discharge Instructions (Addendum)
1.  You may take Zofran as needed for nausea/vomiting. 2.  Clear liquids x12 hours, then bland diet x3 days, then slowly advance diet as tolerated. 3.  Return to the ER for worsening symptoms, persistent vomiting, difficulty breathing or other concerns. 

## 2021-01-18 ENCOUNTER — Encounter (HOSPITAL_COMMUNITY): Payer: Self-pay

## 2021-01-18 ENCOUNTER — Emergency Department (HOSPITAL_COMMUNITY): Payer: Medicaid Other

## 2021-01-18 ENCOUNTER — Emergency Department (HOSPITAL_COMMUNITY)
Admission: EM | Admit: 2021-01-18 | Discharge: 2021-01-18 | Disposition: A | Discharge status: Home / Self Care | Payer: Medicaid Other | Attending: Emergency Medicine | Admitting: Emergency Medicine

## 2021-01-18 DIAGNOSIS — R059 Cough, unspecified: Secondary | ICD-10-CM | POA: Diagnosis not present

## 2021-01-18 DIAGNOSIS — J45909 Unspecified asthma, uncomplicated: Secondary | ICD-10-CM | POA: Diagnosis not present

## 2021-01-18 DIAGNOSIS — R0789 Other chest pain: Secondary | ICD-10-CM

## 2021-01-18 DIAGNOSIS — Z8616 Personal history of COVID-19: Secondary | ICD-10-CM | POA: Diagnosis not present

## 2021-01-18 DIAGNOSIS — F17210 Nicotine dependence, cigarettes, uncomplicated: Secondary | ICD-10-CM | POA: Insufficient documentation

## 2021-01-18 LAB — CBC
HCT: 42.8 % (ref 39.0–52.0)
Hemoglobin: 15.1 g/dL (ref 13.0–17.0)
MCH: 29.5 pg (ref 26.0–34.0)
MCHC: 35.3 g/dL (ref 30.0–36.0)
MCV: 83.8 fL (ref 80.0–100.0)
Platelets: 208 10*3/uL (ref 150–400)
RBC: 5.11 MIL/uL (ref 4.22–5.81)
RDW: 14.5 % (ref 11.5–15.5)
WBC: 9.5 10*3/uL (ref 4.0–10.5)
nRBC: 0 % (ref 0.0–0.2)

## 2021-01-18 LAB — BASIC METABOLIC PANEL
Anion gap: 9 (ref 5–15)
BUN: 12 mg/dL (ref 6–20)
CO2: 25 mmol/L (ref 22–32)
Calcium: 9.1 mg/dL (ref 8.9–10.3)
Chloride: 105 mmol/L (ref 98–111)
Creatinine, Ser: 0.87 mg/dL (ref 0.61–1.24)
GFR, Estimated: >60 mL/min (ref >60)
Glucose, Bld: 104 mg/dL — ABNORMAL HIGH (ref 70–99)
Potassium: 3.8 mmol/L (ref 3.5–5.1)
Sodium: 139 mmol/L (ref 135–145)

## 2021-01-18 LAB — TROPONIN I (HIGH SENSITIVITY): Troponin I (High Sensitivity): 4 ng/L (ref <18)

## 2021-01-18 MED ORDER — KETOROLAC TROMETHAMINE 15 MG/ML IJ SOLN
15.0000 mg | Freq: Once | INTRAMUSCULAR | Status: AC
  Administered 2021-01-18: 15 mg via INTRAVENOUS
  Filled 2021-01-18: qty 1

## 2021-01-18 NOTE — ED Triage Notes (Signed)
Pt comes via [facility] for CP and cough after smoking mariajuana at [employer] tonight, pt is homeless

## 2021-01-18 NOTE — ED Notes (Signed)
Patient verbalizes understanding of discharge instructions. Opportunity for questioning and answers were provided. Armband removed by staff, pt discharged from ED and ambulated to lobby to return home.   

## 2021-01-18 NOTE — ED Provider Notes (Signed)
[facility] EMERGENCY DEPARTMENT Provider Note  CSN: [id number] Arrival date & time: 01/18/21 0114  Chief Complaint(s) Chest Pain  HPI Dominic [name] is a 45 y.o. male here for chest discomfort that began after smoking marijuana.  Burning sensation.  Worse with coughing.  Pain nonradiating nonexertional no shortness of breath.  No nausea or vomiting.  No abdominal pain.  No other physical complaints.  HPI  Past Medical History Past Medical History:  Diagnosis Date  . Anxiety   . Asthma   . Bipolar 1 disorder (HCC)   . Chronic hepatitis C without hepatic coma (HCC) 05/28/2018  . Depression   . Hepatitis C   . Hepatitis C antibody positive in blood 05/28/2018  . Schizophrenia (HCC)   . Sleep apnea    Patient Active Problem List   Diagnosis Date Noted  . COVID-19 11/19/2020  . Schizophrenia (HCC) 05/22/2019  . Schizophrenia, chronic condition (HCC) 01/14/2019  . Alcohol intoxication, uncomplicated (HCC) 01/13/2019  . Asthma 11/25/2018  . Nicotine dependence, cigarettes, uncomplicated 11/25/2018  . Chronic hepatitis C without hepatic coma (HCC) 05/28/2018  . Psychosis (HCC) 02/16/2016  . Alcohol abuse 02/16/2016  . Cocaine abuse (HCC) 02/16/2016   Home Medication(s) Prior to Admission medications   Medication Sig Start Date End Date Taking? Authorizing Provider  ARIPiprazole (ABILIFY) 10 MG tablet Take 2 tablets (20 mg total) by mouth daily. 11/24/20   Clapacs, Jackquline Denmark, MD  ondansetron (ZOFRAN ODT) 4 MG disintegrating tablet Take 1 tablet (4 mg total) by mouth every 8 (eight) hours as needed for nausea or vomiting. 01/12/21   Irean Hong, MD  traZODone (DESYREL) 100 MG tablet Take 1 tablet (100 mg total) by mouth at bedtime. 11/23/20   Clapacs, Jackquline Denmark, MD                                                                                                                                    Past Surgical History Past Surgical History:  Procedure Laterality Date  .  CYST REMOVAL NECK     neck   Family History Family History  Problem Relation Age of Onset  . Hypertension Sister   . Schizophrenia Sister     Social History Social History   Tobacco Use  . Smoking status: Current Some Day Smoker    Packs/day: 1.00    Types: Cigarettes  . Smokeless tobacco: Never Used  Vaping Use  . Vaping Use: Never used  Substance Use Topics  . Alcohol use: Yes    Alcohol/week: 3.0 standard drinks    Types: 3 Shots of liquor per week  . Drug use: Yes    Types: Cocaine, Marijuana    Comment: last used today   Allergies Patient has no known allergies.  Review of Systems Review of Systems All other systems are reviewed and are negative for acute change except as noted in the HPI  Physical Exam  Vital Signs  I have reviewed the triage vital signs BP 124/75   Pulse 62   Temp 97.9 F (36.6 C) (Oral)   Resp 18   SpO2 98%   Physical Exam Vitals reviewed.  Constitutional:      General: He is not in acute distress.    Appearance: He is well-developed and well-nourished. He is not diaphoretic.  HENT:     Head: Normocephalic and atraumatic.     Nose: Nose normal.  Eyes:     General: No scleral icterus.       Right eye: No discharge.        Left eye: No discharge.     Extraocular Movements: EOM normal.     Conjunctiva/sclera: Conjunctivae normal.     Pupils: Pupils are equal, round, and reactive to light.  Cardiovascular:     Rate and Rhythm: Normal rate and regular rhythm.     Heart sounds: No murmur heard. No friction rub. No gallop.   Pulmonary:     Effort: Pulmonary effort is normal. No respiratory distress.     Breath sounds: Normal breath sounds. No stridor. No rales.  Abdominal:     General: There is no distension.     Palpations: Abdomen is soft.     Tenderness: There is no abdominal tenderness.  Musculoskeletal:        General: No tenderness or edema.     Cervical back: Normal range of motion and neck supple.  Skin:    General:  Skin is warm and dry.     Findings: No erythema or rash.  Neurological:     Mental Status: He is alert and oriented to person, place, and time.  Psychiatric:        Mood and Affect: Mood and affect normal.     ED Results and Treatments Labs (all labs ordered are listed, but only abnormal results are displayed) Labs Reviewed  BASIC METABOLIC PANEL - Abnormal; Notable for the following components:      Result Value   Glucose, Bld 104 (*)    All other components within normal limits  CBC  TROPONIN I (HIGH SENSITIVITY)                                                                                                                         EKG  EKG Interpretation  Date/Time:  Tuesday January 18 2021 01:27:13 EST Ventricular Rate:  65 PR Interval:  182 QRS Duration: 90 QT Interval:  396 QTC Calculation: 411 R Axis:   75 Text Interpretation: Normal sinus rhythm Right atrial enlargement Borderline ECG No acute changes Confirmed by Drema Pry [phone]) on 01/18/2021 2:19:11 AM      Radiology DG Chest 2 View  Result Date: 01/18/2021 CLINICAL DATA:  Chest pain, cough after smoking marijuana EXAM: CHEST - 2 VIEW COMPARISON:  Radiograph 01/11/2021 FINDINGS: No consolidative process. Mild atelectatic changes. No pneumothorax or visible effusion. The cardiomediastinal contours are unremarkable. No acute osseous  or soft tissue abnormality. IMPRESSION: Mild atelectasis.  No other acute cardiopulmonary abnormality. Electronically Signed   By: Kreg Shropshire M.D.   On: 01/18/2021 01:53    Pertinent labs & imaging results that were available during my care of the patient were reviewed by me and considered in my medical decision making (see chart for details).  Medications Ordered in ED Medications  ketorolac (TORADOL) 15 MG/ML injection 15 mg (15 mg Intravenous Given 01/18/21 0325)                                                                                                                                     Procedures Procedures  (including critical care time)  Medical Decision Making / ED Course I have reviewed the nursing notes for this encounter and the patient's prior records (if available in EHR or on provided paperwork).   Toron Meril Dray was evaluated in Emergency Department on 01/18/2021 for the symptoms described in the history of present illness. He was evaluated in the context of the global COVID-19 pandemic, which necessitated consideration that the patient might be at risk for infection with the SARS-CoV-2 virus that causes COVID-19. Institutional protocols and algorithms that pertain to the evaluation of patients at risk for COVID-19 are in a state of rapid change based on information released by regulatory bodies including the CDC and federal and state organizations. These policies and algorithms were followed during the patient's care in the ED.  Chest pain is not concerning for cardiac etiology. EKG without acute ischemic changes or evidence of pericarditis.. Troponin drawn in triage was negative.  Do not feel that a second troponin is warranted at this time. Rest of the labs are grossly reassuring without leukocytosis or anemia. No significant electrolyte derangements or renal sufficiency. Chest x-ray without evidence suggestive of pneumonia, pneumothorax, pneumomediastinum.  No abnormal contour of the mediastinum to suggest dissection. No evidence of acute injuries. Presentation is not classic for aortic dissection or esophageal perforation.  Low suspicion for pulmonary embolism.      Final Clinical Impression(s) / ED Diagnoses Final diagnoses:  Other chest pain   The patient appears reasonably screened and/or stabilized for discharge and I doubt any other medical condition or other [facility] requiring further screening, evaluation, or treatment in the ED at this time prior to discharge. Safe for discharge with strict return precautions.  Disposition:  Discharge  Condition: Good  I have discussed the results, Dx and Tx plan with the patient/family who expressed understanding and agree(s) with the plan. Discharge instructions discussed at length. The patient/family was given strict return precautions who verbalized understanding of the instructions. No further questions at time of discharge.    ED Discharge Orders    None        This chart was dictated using voice recognition software.  Despite best efforts to proofread,  errors can occur which can change the documentation meaning.  Nira Connardama, Pedro Eduardo, MD 01/18/21 [phone]

## 2021-02-25 ENCOUNTER — Other Ambulatory Visit: Payer: Self-pay

## 2021-02-25 ENCOUNTER — Emergency Department (HOSPITAL_COMMUNITY)
Admission: EM | Admit: 2021-02-25 | Discharge: 2021-02-26 | Disposition: A | Discharge status: Home / Self Care | Payer: Medicaid Other

## 2021-02-25 NOTE — ED Notes (Signed)
Pt called multiple times to triage, no answer.

## 2021-03-02 ENCOUNTER — Other Ambulatory Visit (HOSPITAL_COMMUNITY): Payer: Self-pay

## 2021-04-06 ENCOUNTER — Other Ambulatory Visit: Payer: Self-pay

## 2021-04-06 ENCOUNTER — Ambulatory Visit (HOSPITAL_COMMUNITY)
Admission: EM | Admit: 2021-04-06 | Discharge: 2021-04-06 | Disposition: A | Discharge status: Home / Self Care | Payer: Medicaid Other | Attending: Psychiatry | Admitting: Psychiatry

## 2021-04-06 DIAGNOSIS — F203 Undifferentiated schizophrenia: Secondary | ICD-10-CM | POA: Insufficient documentation

## 2021-04-06 MED ORDER — ARIPIPRAZOLE 10 MG PO TABS: 10.0000 mg | ORAL_TABLET | Freq: Every day | ORAL | 0 refills | Status: DC

## 2021-04-06 NOTE — ED Provider Notes (Signed)
[facility] Medical Screening Exam  Patient Name: Dominic [name] MRN: [id number] Date of Evaluation: 04/06/21 Chief Complaint:   Diagnosis:  Final diagnoses:  Undifferentiated schizophrenia (HCC)    History of Present illness: Dominic [name] is a 45 y.o. male.  Patient presents voluntarily as a walk-in to the [facility] accompanied by his partner.  Patient reports that they have recently acquired housing in the area and they were trying to reestablish outpatient mental health treatment.  Patient denies any suicidal or homicidal ideations and denies any hallucinations.  Patient reports that he used to go to [facility] and he was prescribed Abilify and then was put on the Aristada injection.  He states that he then went and completed a program at [facility] house and was living in [place] with his brother and the medication was switched to [facility].  He states he does not feel that the Invega really assisted him and that his hallucinations continued.  He states that he does have hallucinations approximately 3 times a week but is not having any currently.  He states that he would like to be restarted on Abilify and then put back on the Abilify maintain a already Aristada injection.  After reviewing the chart it does show that the patient used to be prescribed Abilify 20 mg p.o. daily.  Discussed with patient that we will restart Abilify 10 mg and that he need to follow up with open access at the [facility] tomorrow morning with his partner.  He stated understanding and agreement.  Patient was provided with a printed prescription for the Abilify.  Patient was informed to discuss with his outpatient provider about restarting Aristada or Abilify maintain him once he is established.  Psychiatric Specialty Exam  Presentation  General Appearance:Appropriate for Environment; Casual  Eye Contact:Good  Speech:Clear and Coherent; Normal Rate  Speech Volume:Normal  Handedness:Right   Mood and  Affect  Mood:Euthymic  Affect:Appropriate; Congruent   Thought Process  Thought Processes:Coherent  Descriptions of Associations:Intact  Orientation:Full (Time, Place and Person)  Thought Content:WDL  Diagnosis of Schizophrenia or Schizoaffective disorder in past: Yes  Duration of Psychotic Symptoms: Greater than six months  Hallucinations:None (None currently but has hallucinations about 3x a week)  Ideas of Reference:None  Suicidal Thoughts:No  Homicidal Thoughts:No   Sensorium  Memory:Immediate Good; Recent Good; Remote Good  Judgment:Good  Insight:Good   Executive Functions  Concentration:Good  Attention Span:Good  Recall:Good  Fund of Knowledge:Good  Language:Good   Psychomotor Activity  Psychomotor Activity:Normal   Assets  Assets:Communication Skills; Desire for Improvement; Financial Resources/Insurance; Housing; Physical Health; Social Support; Transportation   Sleep  Sleep:Good  Number of hours: No data recorded  No data recorded  Physical Exam: Physical Exam Vitals and nursing note reviewed.  Constitutional:      Appearance: He is well-developed.  HENT:     Head: Normocephalic.  Eyes:     Pupils: Pupils are equal, round, and reactive to light.  Cardiovascular:     Rate and Rhythm: Normal rate.  Pulmonary:     Effort: Pulmonary effort is normal.  Musculoskeletal:        General: Normal range of motion.  Neurological:     Mental Status: He is alert and oriented to person, place, and time.    Review of Systems  Constitutional: Negative.   HENT: Negative.   Eyes: Negative.   Respiratory: Negative.   Cardiovascular: Negative.   Gastrointestinal: Negative.   Genitourinary: Negative.   Musculoskeletal: Negative.   Skin:  Negative.   Neurological: Negative.   Endo/Heme/Allergies: Negative.   Psychiatric/Behavioral: Negative.    Blood pressure 120/77, pulse (!) 58, temperature 98.2 F (36.8 C), temperature source Oral,  resp. rate 16, SpO2 96 %. There is no height or weight on file to calculate BMI.  Musculoskeletal: Strength & Muscle Tone: within normal limits Gait & Station: normal Patient leans: N/A   [facility] MSE Discharge Disposition for Follow up and Recommendations: Based on my evaluation the patient does not appear to have an emergency medical condition and can be discharged with resources and follow up care in outpatient services for Medication Management and Individual Therapy   Maryfrances Bunnell, FNP 04/06/2021, 12:22 PM

## 2021-04-06 NOTE — Progress Notes (Signed)
   04/06/21 1136  [facility] Triage Screening (Walk-ins at [facility] only)  How Did You Hear About [place]? Self  What Is the Reason for Your Visit/Call Today? Patient presents with partner stating he has had trouble establishing services to get back on his medications.  Patient is here with partner and they recently moved to [place]. He is asking to see a provider, stating "ma'am I just really have to get my medications please."  Patient has been on Invega in the past, however [facility] changed to abilify, which patient states has not been working.  He is having worsening hallucinations and is hoping to get established with a new provider in the area.  Per partner, patient would not be open to inpt tx, as they just moved to a new place together and he wouldn't want to have her staying alone. Patient's partner, Denzil Magnuson, is checking in as well and states she could help with any questions from providers as she has been involved in his care.  How Long Has This Been Causing You Problems? 1-6 months  Have You Recently Had Any Thoughts About Hurting Yourself? No  Are You Planning to Commit Suicide/Harm Yourself At This time? No  Have you Recently Had Thoughts About Hurting Someone [name]? No  Are You Planning To Harm Someone At This Time? No  Are you currently experiencing any auditory, visual or other hallucinations? Yes  Please explain the hallucinations you are currently experiencing: Patient reports AH, partner states they have worsened. Per partner, Hinda Glatter worked best for patient.  Have You Used Any Alcohol or Drugs in the Past 24 Hours? No  Do you have any current medical co-morbidities that require immediate attention? No  Clinician description of patient physical appearance/behavior: Patient is insistent he needs to be seen, seems overwhelmed and goes out to lobby phone to call SS office.  What Do You Feel Would Help You the Most Today? Medication(s)  If access to [facility] was not available, would you have sought  care in the Emergency Department? No  Determination of Need Routine (7 days)  Options For Referral Medication Management

## 2021-04-06 NOTE — ED Notes (Signed)
Pt discharged in no acute distress. Verbalized understanding of discharge instructions reviewed on AVS. Safety maintained. °

## 2021-04-06 NOTE — Discharge Instructions (Signed)

## 2021-04-18 ENCOUNTER — Other Ambulatory Visit: Payer: Self-pay

## 2021-04-18 ENCOUNTER — Ambulatory Visit (HOSPITAL_COMMUNITY): Payer: Medicaid Other | Admitting: Licensed Clinical Social Worker

## 2021-04-26 ENCOUNTER — Other Ambulatory Visit: Payer: Self-pay

## 2021-04-26 ENCOUNTER — Ambulatory Visit (INDEPENDENT_AMBULATORY_CARE_PROVIDER_SITE_OTHER): Payer: Medicaid Other | Admitting: Licensed Clinical Social Worker

## 2021-04-26 DIAGNOSIS — F209 Schizophrenia, unspecified: Secondary | ICD-10-CM

## 2021-04-29 ENCOUNTER — Other Ambulatory Visit: Payer: Self-pay

## 2021-04-29 ENCOUNTER — Encounter (HOSPITAL_COMMUNITY): Payer: Self-pay | Admitting: Physician Assistant

## 2021-04-29 ENCOUNTER — Ambulatory Visit (INDEPENDENT_AMBULATORY_CARE_PROVIDER_SITE_OTHER): Payer: Medicaid Other | Admitting: Physician Assistant

## 2021-04-29 VITALS — BP 123/84 | HR 61 | Ht 72.0 in | Wt 207.0 lb

## 2021-04-29 DIAGNOSIS — F209 Schizophrenia, unspecified: Secondary | ICD-10-CM

## 2021-04-29 DIAGNOSIS — F99 Mental disorder, not otherwise specified: Secondary | ICD-10-CM

## 2021-04-29 DIAGNOSIS — F5105 Insomnia due to other mental disorder: Secondary | ICD-10-CM

## 2021-04-29 MED ORDER — TRAZODONE HCL 100 MG PO TABS
100.0000 mg | ORAL_TABLET | Freq: Every day | ORAL | 1 refills | Status: DC
  Filled 2021-04-29: qty 30, 30d supply, fill #0

## 2021-04-29 MED ORDER — ARIPIPRAZOLE 10 MG PO TABS
10.0000 mg | ORAL_TABLET | Freq: Every day | ORAL | 1 refills | Status: DC
  Filled 2021-04-29: qty 30, 30d supply, fill #0
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
## 2021-05-02 ENCOUNTER — Encounter (HOSPITAL_COMMUNITY): Payer: Self-pay | Admitting: Physician Assistant

## 2021-05-02 NOTE — Progress Notes (Signed)
   THERAPIST PROGRESS NOTE  Session Time: 55 min  Participation Level: Active  Behavioral Response: CasualAlertAnxious  Type of Therapy:  Establish outpt care.  Treatment Goals addressed: Anxiety and Coping  Interventions: Other: Assessment of needs  Summary: Dominic [name] is a 45 y.o. male who presents with long hx of Schizophrenia per his report. Pt had full CCA done 11/19/20  and screening done 04/06/21. Pt states he has been off meds for a very long time until recently as he had problems with his Medicaid. Pt will be having Medicaid changed from [place] to [place], discussed process of making change pt intends to do today. Pt states he is only taking Abilify at present. He reports he has been well managed on injections in past and would like to get back on injections. Pt has med appt here 04/29/21. Pt also states goal of quitting smoking cigarettes. Provided education on patches/gum. Pt will discuss this with med provider also. He agrees for LCSW to provide some coordination of care communication with med provider, which was done post session. Pt is noted to be very restless throughout session. He admits to hearing voices and being bothered by "evil spirits". Pt states he uses visualization of a red light to cope. His primary stressors are getting MH well managed, finances, trying to get custody back of his children, finding better housing. Pt states he is in a healthy and supportive relationship with Dominic [name] x 13 yrs. She is the mother of pt's two dtrs who are in 6th and 11th grade. Dtrs are currently in the care of pt's sister. He states his relationship with dtrs is "great". Pt reports good realtionships with all siblings/fam. Pt has recently been homeless but is currently living in a room of a boarding house. He is actively seeking alternate housing to support his goal of reunification with children. Pt working at [employer] and has had Disability since ~2011. Pt walks everywhere he  needs to go most of the time. LCSW reviewed poc including scheduling prior to close of session. Pt states appreciation for care.  Suicidal/Homicidal: Nowithout intent/plan  Therapist Response: Pt wishes to return for ongoing counseling/support.  Plan: Return again for next avail appt.  Diagnosis: Axis I:  Schizophrenia, chronic  Worden Sink, LCSW 05/02/2021

## 2021-05-05 ENCOUNTER — Other Ambulatory Visit (HOSPITAL_COMMUNITY): Payer: Self-pay

## 2021-05-06 ENCOUNTER — Other Ambulatory Visit: Payer: Self-pay

## 2021-06-03 ENCOUNTER — Other Ambulatory Visit (HOSPITAL_COMMUNITY): Payer: Self-pay

## 2021-06-11 ENCOUNTER — Emergency Department
Admission: EM | Admit: 2021-06-11 | Discharge: 2021-06-12 | Disposition: A | Discharge status: Home / Self Care | Payer: Medicaid Other | Attending: Emergency Medicine | Admitting: Emergency Medicine

## 2021-06-11 DIAGNOSIS — Z8616 Personal history of COVID-19: Secondary | ICD-10-CM | POA: Diagnosis not present

## 2021-06-11 DIAGNOSIS — F203 Undifferentiated schizophrenia: Secondary | ICD-10-CM | POA: Diagnosis not present

## 2021-06-11 DIAGNOSIS — Z79899 Other long term (current) drug therapy: Secondary | ICD-10-CM | POA: Diagnosis not present

## 2021-06-11 DIAGNOSIS — Y900 Blood alcohol level of less than 20 mg/100 ml: Secondary | ICD-10-CM | POA: Diagnosis not present

## 2021-06-11 DIAGNOSIS — J45909 Unspecified asthma, uncomplicated: Secondary | ICD-10-CM | POA: Diagnosis not present

## 2021-06-11 DIAGNOSIS — R059 Cough, unspecified: Secondary | ICD-10-CM | POA: Diagnosis not present

## 2021-06-11 DIAGNOSIS — Z20822 Contact with and (suspected) exposure to covid-19: Secondary | ICD-10-CM | POA: Diagnosis not present

## 2021-06-11 DIAGNOSIS — F209 Schizophrenia, unspecified: Secondary | ICD-10-CM | POA: Insufficient documentation

## 2021-06-11 DIAGNOSIS — Z046 Encounter for general psychiatric examination, requested by authority: Secondary | ICD-10-CM | POA: Insufficient documentation

## 2021-06-11 DIAGNOSIS — R456 Violent behavior: Secondary | ICD-10-CM | POA: Diagnosis present

## 2021-06-11 DIAGNOSIS — F17210 Nicotine dependence, cigarettes, uncomplicated: Secondary | ICD-10-CM | POA: Insufficient documentation

## 2021-06-11 LAB — SALICYLATE LEVEL: Salicylate Lvl: <7 mg/dL — ABNORMAL LOW (ref 7.0–30.0)

## 2021-06-11 LAB — CBC
HCT: 41.9 % (ref 39.0–52.0)
Hemoglobin: 15.4 g/dL (ref 13.0–17.0)
MCH: 30 pg (ref 26.0–34.0)
MCHC: 36.8 g/dL — ABNORMAL HIGH (ref 30.0–36.0)
MCV: 81.5 fL (ref 80.0–100.0)
Platelets: 232 10*3/uL (ref 150–400)
RBC: 5.14 MIL/uL (ref 4.22–5.81)
RDW: 14.1 % (ref 11.5–15.5)
WBC: 11.9 10*3/uL — ABNORMAL HIGH (ref 4.0–10.5)
nRBC: 0 % (ref 0.0–0.2)

## 2021-06-11 LAB — COMPREHENSIVE METABOLIC PANEL
ALT: 45 U/L — ABNORMAL HIGH (ref 0–44)
AST: 50 U/L — ABNORMAL HIGH (ref 15–41)
Albumin: 4.5 g/dL (ref 3.5–5.0)
Alkaline Phosphatase: 45 U/L (ref 38–126)
Anion gap: 9 (ref 5–15)
BUN: 15 mg/dL (ref 6–20)
CO2: 25 mmol/L (ref 22–32)
Calcium: 9.2 mg/dL (ref 8.9–10.3)
Chloride: 105 mmol/L (ref 98–111)
Creatinine, Ser: 0.95 mg/dL (ref 0.61–1.24)
GFR, Estimated: >60 mL/min (ref >60)
Glucose, Bld: 97 mg/dL (ref 70–99)
Potassium: 3.2 mmol/L — ABNORMAL LOW (ref 3.5–5.1)
Sodium: 139 mmol/L (ref 135–145)
Total Bilirubin: 1.4 mg/dL — ABNORMAL HIGH (ref 0.3–1.2)
Total Protein: 8.8 g/dL — ABNORMAL HIGH (ref 6.5–8.1)

## 2021-06-11 LAB — ACETAMINOPHEN LEVEL: Acetaminophen (Tylenol), Serum: <10 ug/mL — ABNORMAL LOW (ref 10–30)

## 2021-06-11 LAB — ETHANOL: Alcohol, Ethyl (B): <10 mg/dL (ref <10)

## 2021-06-11 LAB — RESP PANEL BY RT-PCR (FLU A&B, COVID) ARPGX2
Influenza A by PCR: NEGATIVE
Influenza B by PCR: NEGATIVE
SARS Coronavirus 2 by RT PCR: NEGATIVE

## 2021-06-11 MED ORDER — TRAZODONE HCL 100 MG PO TABS
100.0000 mg | ORAL_TABLET | Freq: Every day | ORAL | Status: DC
  Administered 2021-06-11: 100 mg via ORAL
  Filled 2021-06-11: qty 1

## 2021-06-11 MED ORDER — BENZONATATE 100 MG PO CAPS
100.0000 mg | ORAL_CAPSULE | Freq: Once | ORAL | Status: AC
  Administered 2021-06-11: 100 mg via ORAL
  Filled 2021-06-11: qty 1

## 2021-06-11 MED ORDER — ACETAMINOPHEN 500 MG PO TABS
1000.0000 mg | ORAL_TABLET | Freq: Once | ORAL | Status: AC
  Administered 2021-06-11: 1000 mg via ORAL
  Filled 2021-06-11: qty 2

## 2021-06-11 MED ORDER — ARIPIPRAZOLE 10 MG PO TABS
10.0000 mg | ORAL_TABLET | Freq: Every day | ORAL | Status: DC
  Administered 2021-06-12: 10 mg via ORAL
  Filled 2021-06-11 (×2): qty 1

## 2021-06-11 NOTE — ED Provider Notes (Signed)
[facility] Emergency Department Provider Note   ____________________________________________    I have reviewed the triage vital signs and the nursing notes.   HISTORY  Chief Complaint Abnormal behavior     HPI Dominic [name] is a 45 y.o. male with history of bipolar disorder and schizophrenia brought in by [facility] police department because of agitated and abnormal behavior.  Reportedly has not been compliant with medications.  Patient reports a mild cough but otherwise states that he feels "fine ".  No reports of self injury.  Past Medical History:  Diagnosis Date   Anxiety    Asthma    Bipolar 1 disorder (HCC)    Chronic hepatitis C without hepatic coma (HCC) 05/28/2018   Depression    Hepatitis C    Hepatitis C antibody positive in blood 05/28/2018   Schizophrenia (HCC)    Sleep apnea     Patient Active Problem List   Diagnosis Date Noted   COVID-19 11/19/2020   Schizophrenia (HCC) 05/22/2019   Schizophrenia, chronic condition (HCC) 01/14/2019   Alcohol intoxication, uncomplicated (HCC) 01/13/2019   Asthma 11/25/2018   Nicotine dependence, cigarettes, uncomplicated 11/25/2018   Chronic hepatitis C without hepatic coma (HCC) 05/28/2018   Psychosis (HCC) 02/16/2016   Alcohol abuse 02/16/2016   Cocaine abuse (HCC) 02/16/2016    Past Surgical History:  Procedure Laterality Date   CYST REMOVAL NECK     neck    Prior to Admission medications   Medication Sig Start Date End Date Taking? Authorizing Provider  ARIPiprazole (ABILIFY) 10 MG tablet Take 1 tablet (10 mg total) by mouth daily. 04/29/21   Nwoko, Tommas Olp, PA  ondansetron (ZOFRAN ODT) 4 MG disintegrating tablet Take 1 tablet (4 mg total) by mouth every 8 (eight) hours as needed for nausea or vomiting. 01/12/21   Irean Hong, MD  traZODone (DESYREL) 100 MG tablet Take 1 tablet (100 mg total) by mouth at bedtime. 04/29/21   Meta Hatchet, PA      Allergies Tomato  Family History  Problem Relation Age of Onset   Hypertension Sister    Schizophrenia Sister     Social History Social History   Tobacco Use   Smoking status: Some Days    Packs/day: 1.00    Types: Cigarettes   Smokeless tobacco: Never  Vaping Use   Vaping Use: Never used  Substance Use Topics   Alcohol use: Yes    Alcohol/week: 3.0 standard drinks    Types: 3 Shots of liquor per week   Drug use: Yes    Types: Cocaine, Marijuana    Comment: last used today    Review of Systems  Constitutional: No fever/chills Eyes: No visual changes.  ENT: No sore throat. Cardiovascular: Denies chest pain. Respiratory: Denies shortness of breath. Gastrointestinal: No abdominal pain.  No nausea, no vomiting.   Genitourinary: Negative for dysuria. Musculoskeletal: Negative for back pain. Skin: Negative for rash. Neurological: Negative for headaches or weakness   ____________________________________________   PHYSICAL EXAM:  VITAL SIGNS: ED Triage Vitals  Enc Vitals Group     BP 06/11/21 0606 (!) 134/107     Pulse Rate 06/11/21 0606 (!) 134     Resp 06/11/21 0605 (!) 22     Temp 06/11/21 0605 98.4 F (36.9 C)     Temp Source 06/11/21 0605 Oral     SpO2 06/11/21 0605 100 %     Weight 06/11/21 0606 104.3 kg (230 lb)  Height 06/11/21 0606 1.854 m (6\' 1" )     Head Circumference --      Peak Flow --      Pain Score --      Pain Loc --      Pain Edu? --      Excl. in GC? --     Constitutional: Alert No acute distress.  Nose: No congestion/rhinnorhea. Mouth/Throat: Mucous membranes are moist.    Cardiovascular: Normal rate, regular rhythm. Grossly normal heart sounds.  Good peripheral circulation. Respiratory: Normal respiratory effort.  No retractions. Lungs CTAB. Gastrointestinal: Soft and nontender. No distention  Musculoskeletal: No lower extremity tenderness nor edema.  Warm and well perfused Neurologic:  Normal speech and language. No  gross focal neurologic deficits are appreciated.  Skin:  Skin is warm, dry and intact. No rash noted. Psychiatric: With me, patient's mood and affect are normal, speech does not appear pressured, behavior is calm  ____________________________________________   LABS (all labs ordered are listed, but only abnormal results are displayed)  Labs Reviewed  COMPREHENSIVE METABOLIC PANEL - Abnormal; Notable for the following components:      Result Value   Potassium 3.2 (*)    Total Protein 8.8 (*)    AST 50 (*)    ALT 45 (*)    Total Bilirubin 1.4 (*)    All other components within normal limits  SALICYLATE LEVEL - Abnormal; Notable for the following components:   Salicylate Lvl <7.0 (*)    All other components within normal limits  ACETAMINOPHEN LEVEL - Abnormal; Notable for the following components:   Acetaminophen (Tylenol), Serum <10 (*)    All other components within normal limits  CBC - Abnormal; Notable for the following components:   WBC 11.9 (*)    MCHC 36.8 (*)    All other components within normal limits  RESP PANEL BY RT-PCR (FLU A&B, COVID) ARPGX2  ETHANOL  URINE DRUG SCREEN, QUALITATIVE ([facility] ONLY)   ____________________________________________  EKG   ____________________________________________  RADIOLOGY   ____________________________________________   PROCEDURES  Procedure(s) performed: No  Procedures   Critical Care performed: no ____________________________________________   INITIAL IMPRESSION / ASSESSMENT AND PLAN / ED COURSE  Pertinent labs & imaging results that were available during my care of the patient were reviewed by me and considered in my medical decision making (see chart for details).   Patient presents with abnormal behavior as described above, suspicious for exacerbation of schizophrenia.  His heart rate is improved after initial evaluation, exam is reassuring.  Lab work is unremarkable, medically cleared for psychiatric  evaluation.  The patient is under IVC.  The patient has been placed in psychiatric observation due to the need to provide a safe environment for the patient while obtaining psychiatric consultation and evaluation, as well as ongoing medical and medication management to treat the patient's condition.  The patient has been placed under full IVC at this time.     ____________________________________________   FINAL CLINICAL IMPRESSION(S) / ED DIAGNOSES  Final diagnoses:  Schizophrenia, unspecified type (HCC)        Note:  This document was prepared using Dragon voice recognition software and may include unintentional dictation errors.    , MD 06/11/21 [phone]

## 2021-06-11 NOTE — BH Assessment (Signed)
Comprehensive Clinical Assessment (CCA) Note  06/11/2021 Dominic [name] [id number]  Chief Complaint: No chief complaint on file.  Visit Diagnosis: Schizophrenia   Dominic [name] is a 45 year old male who presents to the ER via law enforcement, after his girlfriend petitioned for him to be under IVC. Patient states, he was placed under IVC for no reason. He was home sleeping when law enforcement came and said he had to go to the ER. Patient was adamant about gaining understanding on "how a mental health person is able to put another mental health person under IVC." He was talking about the girlfriend having mental health problems. Patient is well known to the [facility] system  Per the report of the patient's girlfriend, she's concerned about the patient's behaviors. She reports, he abuses drugs, he's not sleeping like he should and he has erratic behaviors. He also threatens others in their neighbor.   During the interview, the patient was calm, cooperative and pleasant. He was able to provide appropriate and answers to the questions. Throughout the interview, he denied SI/HI and AV/H. He admits to cocaine use. As well as not having had his medications since the first of the year. Per his report, he is working on getting his Medicaid transferred back to [place], from [place]. Then hopefully he will be able to start services again with [facility].  CCA Screening, Triage and Referral (STR)  Patient Reported Information How did you hear about [place]? Family/Friend  What Is the Reason for Your Visit/Call Today? Girlfriend believes the patient was danger to himself and others because he's not takin his medications  How Long Has This Been Causing You Problems? > than 6 months  What Do You Feel Would Help You the Most Today? Alcohol or Drug Use Treatment; Treatment for Depression or other mood problem   Have You Recently Had Any Thoughts About Hurting Yourself? No  Are You Planning to  Commit Suicide/Harm Yourself At This time? No   Have you Recently Had Thoughts About Hurting Someone Dominic [name]? No  Are You Planning to Harm Someone at This Time? No  Explanation: No data recorded  Have You Used Any Alcohol or Drugs in the Past 24 Hours? Yes  How Long Ago Did You Use Drugs or Alcohol? No data recorded What Did You Use and How Much? Cocaine   Do You Currently Have a Therapist/Psychiatrist? No  Name of Therapist/Psychiatrist: No data recorded  Have You Been Recently Discharged From Any Office Practice or Programs? No  Explanation of Discharge From Practice/Program: No data recorded    CCA Screening Triage Referral Assessment Type of Contact: Face-to-Face  Telemedicine Service Delivery:   Is this Initial or Reassessment? No data recorded Date Telepsych consult ordered in [facility]:  08/24/20  Time Telepsych consult ordered in [facility]:  0915  Location of Assessment: [facility] ED  Provider Location: [facility] ED   Collateral Involvement: Girlfriend   Does Patient Have a Court Appointed Legal Guardian? No data recorded Name and Contact of Legal Guardian: Self  If Minor and Not Living with Parent(s), Who has Custody? n/a  Is CPS involved or ever been involved? Never  Is APS involved or ever been involved? Never   Patient Determined To Be At Risk for Harm To Self or Others Based on Review of Patient Reported Information or Presenting Complaint? No  Method: No data recorded Availability of Means: No data recorded Intent: No data recorded Notification Required: No data recorded Additional Information for Danger to Others Potential:  No data recorded Additional Comments for Danger to Others Potential: No data recorded Are There Guns or Other Weapons in Your Home? No data recorded Types of Guns/Weapons: No data recorded Are These Weapons Safely Secured?                            No data recorded Who Could Verify You Are Able To Have These Secured: No data recorded Do You  Have any Outstanding Charges, Pending Court Dates, Parole/Probation? No data recorded Contacted To Inform of Risk of Harm To Self or Others: No data recorded   Does Patient Present under Involuntary Commitment? Yes  IVC Papers Initial File Date: 06/11/21   [place] of Residence: [name]   Patient Currently Receiving the Following Services: Not Receiving Services   Determination of Need: Emergent (2 hours)   Options For Referral: ED Visit     CCA Biopsychosocial Patient Reported Schizophrenia/Schizoaffective Diagnosis in Past: Yes   Strengths: Have a support system, have some insight into his mental illness and able to take care of his basic needs   Mental Health Symptoms Depression:   None   Duration of Depressive symptoms:    Mania:   None   Anxiety:    None   Psychosis:   None   Duration of Psychotic symptoms:    Trauma:   None   Obsessions:   None   Compulsions:   None   Inattention:   None   Hyperactivity/Impulsivity:   None   Oppositional/Defiant Behaviors:   None   Emotional Irregularity:   None   Other Mood/Personality Symptoms:  No data recorded   Mental Status Exam Appearance and self-care  Stature:   Average   Weight:   Average weight   Clothing:   Disheveled   Grooming:   Neglected   Cosmetic use:   None   Posture/gait:   Normal   Motor activity:   Restless   Sensorium  Attention:   Distractible   Concentration:   Scattered   Orientation:   X5   Recall/memory:   Normal   Affect and Mood  Affect:   Appropriate; Full Range   Mood:   Irritable   Relating  Eye contact:   Fleeting   Facial expression:   Responsive   Attitude toward examiner:   Cooperative   Thought and Language  Speech flow:  Clear and Coherent   Thought content:   Appropriate to Mood and Circumstances   Preoccupation:   Other (Comment)   Hallucinations:   None   Organization:  No data recorded  [employer] of Knowledge:   Fair   Intelligence:   Average   Abstraction:   Normal; Functional   Judgement:   Fair; Impaired   Reality Testing:   Realistic; Adequate   Insight:   Fair   Decision Making:   Impulsive   Social Functioning  Social Maturity:   Impulsive   Social Judgement:   Chemical engineer"; Normal   Stress  Stressors:   Family conflict; Transitions; Relationship   Coping Ability:   Exhausted   Skill Deficits:   None   Supports:   Family     Religion: Religion/Spirituality Are You A Religious Person?: No  Leisure/Recreation: Leisure / Recreation Do You Have Hobbies?: No  Exercise/Diet: Exercise/Diet Do You Exercise?: No Have You Gained or Lost A Significant Amount of Weight in the Past Six Months?: No Do You Follow a  Special Diet?: No Do You Have Any Trouble Sleeping?: No   CCA Employment/Education Employment/Work Situation: Employment / Work Situation Employment Situation: Unemployed Has Patient ever Been in Equities trader?: No  Education: Education Did You Have An Individualized Education Program (IIEP): No Did You Have Any Difficulty At [employer]?: No Patient's Education Has Been Impacted by Current Illness: No   CCA Family/Childhood History Family and Relationship History: Family history Marital status: Long term relationship Does patient have children?: Yes How many children?: 2 How is patient's relationship with their children?: states it's good  Childhood History:  Childhood History By whom was/is the patient raised?: Mother Did patient suffer any verbal/emotional/physical/sexual abuse as a child?: No Did patient suffer from severe childhood neglect?: No Has patient ever been sexually abused/assaulted/raped as an adolescent or adult?: No Was the patient ever a victim of a crime or a disaster?: No Witnessed domestic violence?: No Has patient been affected by domestic violence as an adult?: No  Child/Adolescent  Assessment:     CCA Substance Use Alcohol/Drug Use: Alcohol / Drug Use Pain Medications: See PTA Prescriptions: See PTA Over the Counter: See PTA History of alcohol / drug use?: Yes Substance #1 Name of Substance 1: Cocaine 1 - Last Use / Amount: 06/11/2021  ASAM's:  Six Dimensions of Multidimensional Assessment  Dimension 1:  Acute Intoxication and/or Withdrawal Potential:      Dimension 2:  Biomedical Conditions and Complications:      Dimension 3:  Emotional, Behavioral, or Cognitive Conditions and Complications:     Dimension 4:  Readiness to Change:     Dimension 5:  Relapse, Continued use, or Continued Problem Potential:     Dimension 6:  Recovery/Living Environment:     ASAM Severity Score:    ASAM Recommended Level of Treatment:     Substance use Disorder (SUD)    Recommendations for Services/Supports/Treatments:    Discharge Disposition:    DSM5 Diagnoses: Patient Active Problem List   Diagnosis Date Noted   COVID-19 11/19/2020   Schizophrenia, 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   Schizophrenia, chronic condition (HCC) 01/14/2019   Alcohol intoxication, uncomplicated (HCC) 01/13/2019   Asthma 11/25/2018   Nicotine dependence, cigarettes, uncomplicated 11/25/2018   Chronic hepatitis C without hepatic coma (HCC) 05/28/2018   Alcohol abuse 02/16/2016   Cocaine abuse (HCC) 02/16/2016     Referrals to Alternative Service(s): Referred to Alternative Service(s):   Place:   Date:   Time:    Referred to Alternative Service(s):   Place:   Date:   Time:    Referred to Alternative Service(s):   Place:   Date:   Time:    Referred to Alternative Service(s):   Place:   Date:   Time:     Lilyan Gilford MS, LCAS, [facility], [facility] Therapeutic Triage Specialist 06/11/2021 1:00 PM

## 2021-06-11 NOTE — ED Notes (Signed)
Lunch provided.

## 2021-06-11 NOTE — Consult Note (Signed)
[facility]   Reason for Consult:   Psychiatric Evaluation Referring Physician:  Emergency Physician Patient Identification: Dominic SloughCedric Deon [name] MRN:  [id number] Principal Diagnosis: Schizophrenia, undifferentiated (HCC) Diagnosis:  Principal Problem:   Schizophrenia, undifferentiated (HCC)   Total Time spent with patient: 30 minutes  Subjective:   Dominic [name] is a 45 y.o. male patient admitted with "I wasn't doing nothing."  HPI:  06/11/21 45 year old male presenting to the emergency department following IVC by partner due to concerns for "bizarre behavior and threatening manner" per IVC. Per collateral from his girlfriend of 12 years ([name]), patient has been off his medications since January and has "been acting a fool" by calling her multiple times a day. He has a history of similar behaviors. She states that patient "was a lot better on medications." Patient states he used "crack-cocaine" prior to coming to the hospital yesterday and typically in the community.   On exam the patient appears to be close to baseline: denies suicidal ideation, homicidal ideation, delusions or hallucinations. Patient is able to follow commands and communicate his needs.  "How another mental health patient can IVC another mental health patient?" in reference to his significant other having a mental health history. Patient does have safe housing; do recommend restarting medication and observing patient overnight in consideration of recommendation from significant other. Rehab will be encouraged if he is stable for discharge tomorrow.  Past Psychiatric History:  Anxiety Bipolar 1 Disorder Schizophrenia Depression  Risk to Self:  No Risk to Others:  No Prior Inpatient Therapy:  Yes Prior Outpatient Therapy:  Yes  Past Medical History:  Past Medical History:  Diagnosis Date   Anxiety    Asthma    Bipolar 1 disorder (HCC)    Chronic hepatitis C without hepatic coma  (HCC) 05/28/2018   Depression    Hepatitis C    Hepatitis C antibody positive in blood 05/28/2018   Schizophrenia (HCC)    Sleep apnea     Past Surgical History:  Procedure Laterality Date   CYST REMOVAL NECK     neck   Family History:  Family History  Problem Relation Age of Onset   Hypertension Sister    Schizophrenia Sister    Family Psychiatric  History: Sister: Surveyor, miningchizphrenia Social History:  Social History   Substance and Sexual Activity  Alcohol Use Yes   Alcohol/week: 3.0 standard drinks   Types: 3 Shots of liquor per week     Social History   Substance and Sexual Activity  Drug Use Yes   Types: Cocaine, Marijuana   Comment: last used today    Social History   Socioeconomic History   Marital status: Single    Spouse name: Not on file   Number of children: Not on file   Years of education: Not on file   Highest education level: Not on file  Occupational History   Not on file  Tobacco Use   Smoking status: Some Days    Packs/day: 1.00    Types: Cigarettes   Smokeless tobacco: Never  Vaping Use   Vaping Use: Never used  Substance and Sexual Activity   Alcohol use: Yes    Alcohol/week: 3.0 standard drinks    Types: 3 Shots of liquor per week   Drug use: Yes    Types: Cocaine, Marijuana    Comment: last used today   Sexual activity: Not Currently  Other Topics Concern   Not on file  Social History Narrative  Not on file   Social Determinants of Health   Financial Resource Strain: Not on file  Food Insecurity: Not on file  Transportation Needs: Not on file  Physical Activity: Not on file  Stress: Not on file  Social Connections: Not on file   Additional Social History:    Allergies:   Allergies  Allergen Reactions   Tomato     Labs:  Results for orders placed or performed during the hospital encounter of 06/11/21 (from the past 48 hour(s))  Comprehensive metabolic panel     Status: Abnormal   Collection Time: 06/11/21  6:11 AM   Result Value Ref Range   Sodium 139 135 - 145 mmol/L   Potassium 3.2 (L) 3.5 - 5.1 mmol/L   Chloride 105 98 - 111 mmol/L   CO2 25 22 - 32 mmol/L   Glucose, Bld 97 70 - 99 mg/dL    Comment: Glucose reference range applies only to samples taken after fasting for at least 8 hours.   BUN 15 6 - 20 mg/dL   Creatinine, Ser 2.02 0.61 - 1.24 mg/dL   Calcium 9.2 8.9 - 54.2 mg/dL   Total Protein 8.8 (H) 6.5 - 8.1 g/dL   Albumin 4.5 3.5 - 5.0 g/dL   AST 50 (H) 15 - 41 U/L   ALT 45 (H) 0 - 44 U/L   Alkaline Phosphatase 45 38 - 126 U/L   Total Bilirubin 1.4 (H) 0.3 - 1.2 mg/dL   GFR, Estimated >70 >62 mL/min    Comment: (NOTE) Calculated using the CKD-EPI Creatinine Equation (2021)    Anion gap 9 5 - 15    Comment: Performed at [facility], [address] [place], [place] [address]  Ethanol     Status: None   Collection Time: 06/11/21  6:11 AM  Result Value Ref Range   Alcohol, Ethyl (B) <10 <10 mg/dL    Comment: (NOTE) Lowest detectable limit for serum alcohol is 10 mg/dL.  For medical purposes only. Performed at [facility], [address] [place], [place] [address]   Salicylate level     Status: Abnormal   Collection Time: 06/11/21  6:11 AM  Result Value Ref Range   Salicylate Lvl <7.0 (L) 7.0 - 30.0 mg/dL    Comment: Performed at [facility], [address] [place], [place] [address]  Acetaminophen level     Status: Abnormal   Collection Time: 06/11/21  6:11 AM  Result Value Ref Range   Acetaminophen (Tylenol), Serum <10 (L) 10 - 30 ug/mL    Comment: (NOTE) Therapeutic concentrations vary significantly. A range of 10-30 ug/mL  may be an effective concentration for many patients. However, some  are best treated at concentrations outside of this range. Acetaminophen concentrations >150 ug/mL at 4 hours after ingestion  and >50 ug/mL at 12 hours after ingestion are often associated with  toxic reactions.  Performed at [facility],  [address] [place], [place] [address]   cbc     Status: Abnormal   Collection Time: 06/11/21  6:11 AM  Result Value Ref Range   WBC 11.9 (H) 4.0 - 10.5 K/uL   RBC 5.14 4.22 - 5.81 MIL/uL   Hemoglobin 15.4 13.0 - 17.0 g/dL   HCT 26.9 48.5 - 46.2 %   MCV 81.5 80.0 - 100.0 fL   MCH 30.0 26.0 - 34.0 pg   MCHC 36.8 (H) 30.0 - 36.0 g/dL   RDW 70.3 50.0 - 93.8 %  Platelets 232 150 - 400 K/uL   nRBC 0.0 0.0 - 0.2 %    Comment: Performed at [facility], [address] [place], [place] [address]    Current Facility-Administered Medications  Medication Dose Route Frequency Provider Last Rate Last Admin   ARIPiprazole (ABILIFY) tablet 10 mg  10 mg Oral Daily Charm Rings, NP       traZODone (DESYREL) tablet 100 mg  100 mg Oral QHS Charm Rings, NP       Current Outpatient Medications  Medication Sig Dispense Refill   ARIPiprazole (ABILIFY) 10 MG tablet Take 1 tablet (10 mg total) by mouth daily. 30 tablet 1   traZODone (DESYREL) 100 MG tablet Take 1 tablet (100 mg total) by mouth at bedtime. 30 tablet 1   ondansetron (ZOFRAN ODT) 4 MG disintegrating tablet Take 1 tablet (4 mg total) by mouth every 8 (eight) hours as needed for nausea or vomiting. 20 tablet 0    Musculoskeletal: Strength & Muscle Tone: within normal limits Gait & Station: normal Patient leans: N/A  Psychiatric Specialty Exam: Physical Exam Vitals and nursing note reviewed.  Constitutional:      Appearance: Normal appearance.  HENT:     Head: Normocephalic.     Nose: Nose normal.  Pulmonary:     Effort: Pulmonary effort is normal.  Musculoskeletal:        General: Normal range of motion.  Neurological:     General: No focal deficit present.     Mental Status: He is alert and oriented to person, place, and time.  Psychiatric:        Attention and Perception: Attention normal.        Mood and Affect: Mood is anxious.        Speech: Speech is tangential.        Behavior: Behavior normal.  Behavior is cooperative.        Thought Content: Thought content is paranoid.        Cognition and Memory: Cognition and memory normal.        Judgment: Judgment is impulsive.    Review of Systems  Psychiatric/Behavioral:  The patient is nervous/anxious.   All other systems reviewed and are negative.  Blood pressure (!) 134/107, pulse (!) 134, temperature 98.4 F (36.9 C), temperature source Oral, resp. rate (!) 22, height 6\' 1"  (1.854 m), weight 104.3 kg, SpO2 100 %.Body mass index is 30.34 kg/m.  General Appearance: Fairly Groomed  Eye Contact:  Good  Speech:  Normal Rate; tangential  Volume:  Normal  Mood:   Appropriate  Affect:  Appropriate  Thought Process:  Disorganized  Orientation:  Full (Time, Place, and Person)  Thought Content:  WDL  Suicidal Thoughts:  No  Homicidal Thoughts:  No  Memory:  Immediate;   Fair Recent;   Fair Remote;   Fair  Judgement:  Fair  Insight:  Good  Psychomotor Activity:  Normal  Concentration:  Concentration: Good and Attention Span: Good  Recall:  Good  Fund of Knowledge:  Good  Language:  Good  Akathisia:  Negative  Handed:  Right  AIMS (if indicated):     Assets:  Communication Skills Housing Social Support  ADL's:  Intact  Cognition:  WNL  Sleep:        Physical Exam: Physical Exam Vitals and nursing note reviewed.  Constitutional:      Appearance: Normal appearance.  HENT:     Head: Normocephalic.     Nose: Nose  normal.  Pulmonary:     Effort: Pulmonary effort is normal.  Musculoskeletal:        General: Normal range of motion.  Neurological:     General: No focal deficit present.     Mental Status: He is alert and oriented to person, place, and time.  Psychiatric:        Attention and Perception: Attention normal.        Mood and Affect: Mood is anxious.        Speech: Speech is tangential.        Behavior: Behavior normal. Behavior is cooperative.        Thought Content: Thought content is paranoid.         Cognition and Memory: Cognition and memory normal.        Judgment: Judgment is impulsive.   Review of Systems  Psychiatric/Behavioral:  The patient is nervous/anxious.   All other systems reviewed and are negative. Blood pressure (!) 134/107, pulse (!) 134, temperature 98.4 F (36.9 C), temperature source Oral, resp. rate (!) 22, height 6\' 1"  (1.854 m), weight 104.3 kg, SpO2 100 %. Body mass index is 30.34 kg/m.  Treatment Plan Summary: Daily contact with patient to assess and evaluate symptoms and progress in treatment, Medication management, and Plan    Mood Stabilizer: Restarted Abilify 10 mg  Insomnia: Trazodone 100 mg  Disposition: Adjusted medications and recommend re-evaluation in the morning to assess for safety. Patient will be encouraged to go to rehab for his cocaine/crack use disorder.  , NP 06/11/2021 11:49 AM

## 2021-06-11 NOTE — ED Notes (Signed)
Received night time snack at this time. 

## 2021-06-11 NOTE — ED Notes (Signed)
IVC, pend psych consult 

## 2021-06-11 NOTE — ED Triage Notes (Signed)
Pt arrives with bpd with ivc papers. Per police pt was walking streets, pt arrives yelling, uncooperative, per police pt's girlfriend took IVC papers out on him for not "taking his medications".

## 2021-06-11 NOTE — ED Notes (Signed)
Dinner and lunch tray given also peanut butter and cracker given as a  snack

## 2021-06-12 NOTE — ED Provider Notes (Addendum)
Emergency Medicine Observation Re-evaluation Note  Dominic [name] is a 45 y.o. male, seen on rounds today.  Pt initially presented to the ED for complaints of No chief complaint on file. Currently, the patient is sitting in common room, denies complaints.  Physical Exam  BP 119/71 (BP Location: Left Arm)   Pulse (!) 58   Temp 97.6 F (36.4 C) (Oral)   Resp 14   Ht 6\' 1"  (1.854 m)   Wt 104.3 kg   SpO2 97%   BMI 30.34 kg/m  Physical Exam Constitutional: Resting comfortably. Eyes: Conjunctivae are normal. Head: Atraumatic. Nose: No congestion/rhinnorhea. Mouth/Throat: Mucous membranes are moist. Neck: Normal ROM Cardiovascular: No cyanosis noted. Respiratory: Normal respiratory effort. Gastrointestinal: Non-distended. Genitourinary: deferred Musculoskeletal: No lower extremity tenderness nor edema. Neurologic:  Normal speech and language. No gross focal neurologic deficits are appreciated. Skin:  Skin is warm, dry and intact. No rash noted.   ED Course / MDM  EKG:   I have reviewed the labs performed to date as well as medications administered while in observation.  Recent changes in the last 24 hours include patient evaluated by psychiatry, plan for reassessment following medication administration.  Plan  Current plan is for reevaluation by psychiatry.  Dominic [name] is not under involuntary commitment.   Delford Field, MD 06/12/21 [phone]  ----------------------------------------- 10:51 AM on 06/12/2021 ----------------------------------------- Patient reevaluated by psychiatry and cleared for discharge home, he does not represent an acute threat to himself or others at this time.    06/14/2021, MD 06/12/21 1051

## 2021-06-12 NOTE — Consult Note (Signed)
[facility] Psych ED Discharge  06/12/2021 10:48 AM Dominic [name]  MRN:  [id number]  Method of visit?: Face to Face   Principal Problem: Schizophrenia, undifferentiated (HCC) Discharge Diagnoses: Principal Problem:   Schizophrenia, undifferentiated (HCC)   Subjective: "I'm fine."  45 yo male who presented under IVC after using cocaine/crack and active behaviors with a "threatening manner" per IVC.  His medications were restarted and he tolerated them well, no side effects.  He is interested in going to [facility] or [name] for substance abuse assistance, information provided.  No suicidal/homicidal ideations, hallucinations, or withdrawal symptoms.  Psychiatrically cleared for discharge which the client desires as it is his daughter's birthday.  Total Time spent with patient: 30 minutes  Past Psychiatric History: depression, anxiety, schizophrenia, substance abuse/cocaine  Past Medical History:  Past Medical History:  Diagnosis Date   Anxiety    Asthma    Bipolar 1 disorder (HCC)    Chronic hepatitis C without hepatic coma (HCC) 05/28/2018   Depression    Hepatitis C    Hepatitis C antibody positive in blood 05/28/2018   Schizophrenia (HCC)    Sleep apnea     Past Surgical History:  Procedure Laterality Date   CYST REMOVAL NECK     neck   Family History:  Family History  Problem Relation Age of Onset   Hypertension Sister    Schizophrenia Sister    Family Psychiatric  History: see above Social History:  Social History   Substance and Sexual Activity  Alcohol Use Yes   Alcohol/week: 3.0 standard drinks   Types: 3 Shots of liquor per week     Social History   Substance and Sexual Activity  Drug Use Yes   Types: Cocaine, Marijuana   Comment: last used today    Social History   Socioeconomic History   Marital status: Single    Spouse name: Not on file   Number of children: Not on file   Years of education: Not on file   Highest education level: Not on file   Occupational History   Not on file  Tobacco Use   Smoking status: Some Days    Packs/day: 1.00    Types: Cigarettes   Smokeless tobacco: Never  Vaping Use   Vaping Use: Never used  Substance and Sexual Activity   Alcohol use: Yes    Alcohol/week: 3.0 standard drinks    Types: 3 Shots of liquor per week   Drug use: Yes    Types: Cocaine, Marijuana    Comment: last used today   Sexual activity: Not Currently  Other Topics Concern   Not on file  Social History Narrative   Not on file   Social Determinants of Health   Financial Resource Strain: Not on file  Food Insecurity: Not on file  Transportation Needs: Not on file  Physical Activity: Not on file  Stress: Not on file  Social Connections: Not on file    Tobacco Cessation:  A prescription for an FDA-approved tobacco cessation medication was offered at discharge and the patient refused  Current Medications: Current Facility-Administered Medications  Medication Dose Route Frequency Provider Last Rate Last Admin   ARIPiprazole (ABILIFY) tablet 10 mg  10 mg Oral Daily Charm Rings, NP   10 mg at 06/12/21 0955   traZODone (DESYREL) tablet 100 mg  100 mg Oral QHS Charm Rings, NP   100 mg at 06/11/21 2137   Current Outpatient Medications  Medication Sig Dispense Refill  ARIPiprazole (ABILIFY) 10 MG tablet Take 1 tablet (10 mg total) by mouth daily. 30 tablet 1   traZODone (DESYREL) 100 MG tablet Take 1 tablet (100 mg total) by mouth at bedtime. 30 tablet 1   ondansetron (ZOFRAN ODT) 4 MG disintegrating tablet Take 1 tablet (4 mg total) by mouth every 8 (eight) hours as needed for nausea or vomiting. 20 tablet 0   PTA Medications: (Not in a hospital admission)   Musculoskeletal: Strength & Muscle Tone: within normal limits Gait & Station: normal Patient leans: N/A  Psychiatric Specialty Exam: Physical Exam Vitals and nursing note reviewed.  Constitutional:      Appearance: Normal appearance.  HENT:      Head: Normocephalic.     Nose: Nose normal.  Pulmonary:     Effort: Pulmonary effort is normal.  Musculoskeletal:        General: Normal range of motion.     Cervical back: Normal range of motion.  Neurological:     General: No focal deficit present.     Mental Status: He is alert and oriented to person, place, and time.  Psychiatric:        Attention and Perception: Attention and perception normal.        Mood and Affect: Mood is anxious.        Speech: Speech normal.        Behavior: Behavior normal. Behavior is cooperative.        Thought Content: Thought content normal.        Cognition and Memory: Cognition and memory normal.        Judgment: Judgment normal.    Review of Systems  Psychiatric/Behavioral:  Positive for substance abuse. The patient is nervous/anxious.   All other systems reviewed and are negative.  Blood pressure 117/67, pulse (!) 52, temperature 97.8 F (36.6 C), temperature source Oral, resp. rate 16, height 6\' 1"  (1.854 m), weight 104.3 kg, SpO2 98 %.Body mass index is 30.34 kg/m.  General Appearance: Casual  Eye Contact:  Good  Speech:  Normal Rate  Volume:  Normal  Mood:  Anxious  Affect:  Congruent  Thought Process:  Coherent and Descriptions of Associations: Intact  Orientation:  Full (Time, Place, and Person)  Thought Content:  WDL and Logical  Suicidal Thoughts:  No  Homicidal Thoughts:  No  Memory:  Immediate;   Good Recent;   Good Remote;   Good  Judgement:  Fair  Insight:  Fair  Psychomotor Activity:  Normal  Concentration:  Concentration: Good and Attention Span: Good  Recall:  Good  Fund of Knowledge:  Good  Language:  Good  Akathisia:  No  Handed:  Right  AIMS (if indicated):     Assets:  Housing Leisure Time Physical Health Resilience Social Support  ADL's:  Intact  Cognition:  WNL  Sleep:         Physical Exam: Physical Exam Vitals and nursing note reviewed.  Constitutional:      Appearance: Normal appearance.   HENT:     Head: Normocephalic.     Nose: Nose normal.  Pulmonary:     Effort: Pulmonary effort is normal.  Musculoskeletal:        General: Normal range of motion.     Cervical back: Normal range of motion.  Neurological:     General: No focal deficit present.     Mental Status: He is alert and oriented to person, place, and time.  Psychiatric:  Attention and Perception: Attention and perception normal.        Mood and Affect: Mood is anxious.        Speech: Speech normal.        Behavior: Behavior normal. Behavior is cooperative.        Thought Content: Thought content normal.        Cognition and Memory: Cognition and memory normal.        Judgment: Judgment normal.   Review of Systems  Psychiatric/Behavioral:  Positive for substance abuse. The patient is nervous/anxious.   All other systems reviewed and are negative. Blood pressure 117/67, pulse (!) 52, temperature 97.8 F (36.6 C), temperature source Oral, resp. rate 16, height 6\' 1"  (1.854 m), weight 104.3 kg, SpO2 98 %. Body mass index is 30.34 kg/m.   Demographic Factors:  Male  Loss Factors: NA  Historical Factors: Impulsivity  Risk Reduction Factors:   Sense of responsibility to family, Living with another person, especially a relative, and Positive social support  Continued Clinical Symptoms:  Anxiety, mild  Cognitive Features That Contribute To Risk:  None    Suicide Risk:  Minimal: No identifiable suicidal ideation.  Patients presenting with no risk factors but with morbid ruminations; may be classified as minimal risk based on the severity of the depressive symptoms    Plan Of Care/Follow-up recommendations:  Activity:  as tolerated Diet:  heart healthy diet Schizophrenia, undifferentiated: -Continue Ability 10 mg daily -Follow up with [facility] or [name]  Cocaine abuse: -Substance IOP groups at   Insomnia: -Continue Trazodone 100 mg at bedtime   Disposition: discharge home, follow  up at [facility] or [facility], NP 06/12/2021, 10:48 AM

## 2021-06-12 NOTE — ED Notes (Signed)
IVC / pending reassessment in the AM. 

## 2021-06-17 ENCOUNTER — Encounter (HOSPITAL_COMMUNITY): Payer: Medicaid Other | Admitting: Family

## 2021-06-29 ENCOUNTER — Other Ambulatory Visit: Payer: Self-pay

## 2021-06-29 ENCOUNTER — Emergency Department: Payer: Medicaid Other

## 2021-06-29 ENCOUNTER — Emergency Department
Admission: EM | Admit: 2021-06-29 | Discharge: 2021-06-29 | Disposition: A | Discharge status: Home / Self Care | Payer: Medicaid Other | Attending: Emergency Medicine | Admitting: Emergency Medicine

## 2021-06-29 ENCOUNTER — Ambulatory Visit (HOSPITAL_COMMUNITY): Payer: Medicaid Other | Admitting: Licensed Clinical Social Worker

## 2021-06-29 ENCOUNTER — Encounter: Payer: Self-pay | Admitting: Emergency Medicine

## 2021-06-29 DIAGNOSIS — Z79899 Other long term (current) drug therapy: Secondary | ICD-10-CM | POA: Insufficient documentation

## 2021-06-29 DIAGNOSIS — J45909 Unspecified asthma, uncomplicated: Secondary | ICD-10-CM | POA: Diagnosis not present

## 2021-06-29 DIAGNOSIS — Z8616 Personal history of COVID-19: Secondary | ICD-10-CM | POA: Diagnosis not present

## 2021-06-29 DIAGNOSIS — F17210 Nicotine dependence, cigarettes, uncomplicated: Secondary | ICD-10-CM | POA: Diagnosis not present

## 2021-06-29 DIAGNOSIS — U071 COVID-19: Secondary | ICD-10-CM | POA: Diagnosis not present

## 2021-06-29 DIAGNOSIS — R059 Cough, unspecified: Secondary | ICD-10-CM | POA: Diagnosis present

## 2021-06-29 LAB — RESP PANEL BY RT-PCR (FLU A&B, COVID) ARPGX2
Influenza A by PCR: NEGATIVE
Influenza B by PCR: NEGATIVE
SARS Coronavirus 2 by RT PCR: POSITIVE — AB

## 2021-06-29 MED ORDER — HYDROCOD POLST-CPM POLST ER 10-8 MG/5ML PO SUER
5.0000 mL | Freq: Once | ORAL | Status: AC
Start: 2021-06-29 — End: 2021-06-29
  Administered 2021-06-29: 5 mL via ORAL
  Filled 2021-06-29: qty 5

## 2021-06-29 MED ORDER — PSEUDOEPH-BROMPHEN-DM 30-2-10 MG/5ML PO SYRP: 10.0000 mL | ORAL_SOLUTION | Freq: Four times a day (QID) | ORAL | 0 refills | Status: DC | PRN

## 2021-06-29 MED ORDER — NAPROXEN 500 MG PO TABS: 500.0000 mg | ORAL_TABLET | Freq: Two times a day (BID) | ORAL | 0 refills | Status: DC

## 2021-06-29 NOTE — ED Triage Notes (Signed)
Pt comes into the ED via [facility] from home c/o cough, body aches, and fever.  Tylenol 375mg  given.  PT unsure if he has been around anyone with COVID.  96% RA, 80 HR, 115/77.

## 2021-06-29 NOTE — ED Provider Notes (Signed)
[facility] Emergency Department Provider Note  ____________________________________________  Time seen: Approximately 5:55 PM  I have reviewed the triage vital signs and the nursing notes.   HISTORY  Chief Complaint Cough and Generalized Body Aches   HPI Dominic [name] is a 45 y.o. male who presents to the emergency department for treatment and evaluation of cough, fever, and generalized body aches that been present for the past 3 days.  Patient states that he "sniffed a little cocaine to try and help."   Past Medical History:  Diagnosis Date   Anxiety    Asthma    Bipolar 1 disorder (HCC)    Chronic hepatitis C without hepatic coma (HCC) 05/28/2018   Depression    Hepatitis C    Hepatitis C antibody positive in blood 05/28/2018   Schizophrenia (HCC)    Sleep apnea     Patient Active Problem List   Diagnosis Date Noted   COVID-19 11/19/2020   Schizophrenia, 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   Schizophrenia, chronic condition (HCC) 01/14/2019   Alcohol intoxication, uncomplicated (HCC) 01/13/2019   Asthma 11/25/2018   Nicotine dependence, cigarettes, uncomplicated 11/25/2018   Chronic hepatitis C without hepatic coma (HCC) 05/28/2018   Alcohol abuse 02/16/2016   Cocaine abuse (HCC) 02/16/2016    Past Surgical History:  Procedure Laterality Date   CYST REMOVAL NECK     neck    Prior to Admission medications   Medication Sig Start Date End Date Taking? Authorizing Provider  brompheniramine-pseudoephedrine-DM 30-2-10 MG/5ML syrup Take 10 mLs by mouth 4 (four) times daily as needed. 06/29/21  Yes Kam Kushnir B, FNP  naproxen (NAPROSYN) 500 MG tablet Take 1 tablet (500 mg total) by mouth 2 (two) times daily with a meal. 06/29/21  Yes Shakara Tweedy B, FNP  ARIPiprazole (ABILIFY) 10 MG tablet Take 1 tablet (10 mg total) by mouth daily. 04/29/21   Nwoko, Tommas Olp, PA  ondansetron (ZOFRAN ODT) 4 MG disintegrating tablet Take 1 tablet (4 mg  total) by mouth every 8 (eight) hours as needed for nausea or vomiting. 01/12/21   Irean Hong, MD  traZODone (DESYREL) 100 MG tablet Take 1 tablet (100 mg total) by mouth at bedtime. 04/29/21   Meta Hatchet, PA    Allergies Tomato  Family History  Problem Relation Age of Onset   Hypertension Sister    Schizophrenia Sister     Social History Social History   Tobacco Use   Smoking status: Some Days    Packs/day: 1.00    Types: Cigarettes   Smokeless tobacco: Never  Vaping Use   Vaping Use: Never used  Substance Use Topics   Alcohol use: Yes    Alcohol/week: 3.0 standard drinks    Types: 3 Shots of liquor per week   Drug use: Yes    Types: Cocaine, Marijuana    Comment: last used today    Review of Systems Constitutional: Positive for fever/chills. Normal appetite. ENT: Positive for sore throat. Cardiovascular: Denies chest pain. Respiratory: Negative for shortness of breath. Positive for cough. No wheezing.  Gastrointestinal: Negative for nausea,  no vomiting.  no diarrhea.  Musculoskeletal: Positive for body aches Skin: Negative for rash. Neurological: Positive for headaches ____________________________________________   PHYSICAL EXAM:  VITAL SIGNS: ED Triage Vitals  Enc Vitals Group     BP 06/29/21 1529 114/77     Pulse Rate 06/29/21 1529 79     Resp 06/29/21 1529 17     Temp 06/29/21 1529 (!) 100.9  F (38.3 C)     Temp Source 06/29/21 1529 Oral     SpO2 06/29/21 1529 94 %     Weight 06/29/21 1527 230 lb (104.3 kg)     Height 06/29/21 1527 6\' 1"  (1.854 m)     Head Circumference --      Peak Flow --      Pain Score 06/29/21 1527 10     Pain Loc --      Pain Edu? --      Excl. in GC? --     Constitutional: Alert and oriented. Overall well appearing and in no acute distress. Eyes: Conjunctivae are normal. Ears: TM normal Nose: Pan sinus congestion noted; no rhinnorhea. Mouth/Throat: Mucous membranes are moist.  Oropharynx normal. Tonsils without  exudate. Uvula midline. Neck: No stridor.  Lymphatic: No cervical lymphadenopathy. Cardiovascular: Normal rate, regular rhythm. Good peripheral circulation. Respiratory: Respirations are even and unlabored.  No retractions. Breath sounds clear. Gastrointestinal: Soft and nontender.  Musculoskeletal: FROM x 4 extremities.  Neurologic:  Normal speech and language. Skin:  Skin is warm, dry and intact. No rash noted. Psychiatric: Mood and affect are normal. Speech and behavior are normal.  ____________________________________________   LABS (all labs ordered are listed, but only abnormal results are displayed)  Labs Reviewed  RESP PANEL BY RT-PCR (FLU A&B, COVID) ARPGX2 - Abnormal; Notable for the following components:      Result Value   SARS Coronavirus 2 by RT PCR POSITIVE (*)    All other components within normal limits   ____________________________________________  EKG  Not indicated. ____________________________________________  RADIOLOGY  Chest x-ray without acute concerns.  ____________________________________________   PROCEDURES  Procedure(s) performed: None  Critical Care performed: No ____________________________________________   INITIAL IMPRESSION / ASSESSMENT AND PLAN / ED COURSE  45 y.o. male presents to the emergency department for treatment and evaluation of viral URI type symptoms.  See HPI for further details.  Plan will be to get a COVID and influenza test and chest x-ray.  COVID positive. Will prescribe Bromfed and Naprosyn. He is to follow up with PCP or return to the ER for symptoms of concern.    Medications  chlorpheniramine-HYDROcodone (TUSSIONEX) 10-8 MG/5ML suspension 5 mL (5 mLs Oral Given 06/29/21 1811)    ED Discharge Orders          Ordered    brompheniramine-pseudoephedrine-DM 30-2-10 MG/5ML syrup  4 times daily PRN        06/29/21 1922    naproxen (NAPROSYN) 500 MG tablet  2 times daily with meals        06/29/21 1922              Pertinent labs & imaging results that were available during my care of the patient were reviewed by me and considered in my medical decision making (see chart for details).    If controlled substance prescribed during this visit, 12 month history viewed on the NCCSRS prior to issuing an initial prescription for Schedule II or III opiod. ____________________________________________   FINAL CLINICAL IMPRESSION(S) / ED DIAGNOSES  Final diagnoses:  COVID-19    Note:  This document was prepared using Dragon voice recognition software and may include unintentional dictation errors.     07/01/21, FNP 06/29/21 1932    07/01/21, MD 06/29/21 2108

## 2021-07-08 ENCOUNTER — Other Ambulatory Visit (HOSPITAL_COMMUNITY): Payer: Self-pay

## 2021-07-11 ENCOUNTER — Other Ambulatory Visit (HOSPITAL_COMMUNITY): Payer: Self-pay

## 2021-07-13 ENCOUNTER — Emergency Department
Admission: EM | Admit: 2021-07-13 | Discharge: 2021-07-13 | Disposition: A | Discharge status: Home / Self Care | Payer: Medicaid Other | Attending: Emergency Medicine | Admitting: Emergency Medicine

## 2021-07-13 ENCOUNTER — Other Ambulatory Visit: Payer: Self-pay

## 2021-07-13 ENCOUNTER — Ambulatory Visit (HOSPITAL_COMMUNITY): Payer: Medicaid Other | Admitting: Licensed Clinical Social Worker

## 2021-07-13 DIAGNOSIS — M79672 Pain in left foot: Secondary | ICD-10-CM | POA: Insufficient documentation

## 2021-07-13 DIAGNOSIS — F17210 Nicotine dependence, cigarettes, uncomplicated: Secondary | ICD-10-CM | POA: Diagnosis not present

## 2021-07-13 DIAGNOSIS — J45909 Unspecified asthma, uncomplicated: Secondary | ICD-10-CM | POA: Diagnosis not present

## 2021-07-13 DIAGNOSIS — R059 Cough, unspecified: Secondary | ICD-10-CM | POA: Diagnosis present

## 2021-07-13 DIAGNOSIS — M7918 Myalgia, other site: Secondary | ICD-10-CM

## 2021-07-13 DIAGNOSIS — Z20822 Contact with and (suspected) exposure to covid-19: Secondary | ICD-10-CM | POA: Diagnosis not present

## 2021-07-13 DIAGNOSIS — Z8616 Personal history of COVID-19: Secondary | ICD-10-CM | POA: Diagnosis not present

## 2021-07-13 DIAGNOSIS — G8929 Other chronic pain: Secondary | ICD-10-CM | POA: Insufficient documentation

## 2021-07-13 DIAGNOSIS — M79671 Pain in right foot: Secondary | ICD-10-CM | POA: Insufficient documentation

## 2021-07-13 LAB — RESP PANEL BY RT-PCR (FLU A&B, COVID) ARPGX2
Influenza A by PCR: NEGATIVE
Influenza B by PCR: NEGATIVE
SARS Coronavirus 2 by RT PCR: NEGATIVE

## 2021-07-13 NOTE — ED Triage Notes (Signed)
Pt here with a cough and bilateral foot pain. Pt has had the cough for a week and wants to be checked for covid so that he can return to work. Pt also has headaches, chills, fever. Pt states that he also has knots on the bottoms of his feet.

## 2021-07-13 NOTE — Discharge Instructions (Addendum)
Check your MyChart for results of your COVID test.  Follow up with Primary Care for foot pain.  Take the naprosyn for foot pain as prescribed a couple of weeks ago.

## 2021-07-13 NOTE — ED Provider Notes (Signed)
[facility] Emergency Department Provider Note  ____________________________________________  Time seen: Approximately 4:15 PM  I have reviewed the triage vital signs and the nursing notes.   HISTORY  Chief Complaint Cough   HPI Dominic [name] is a 45 y.o. male presents to the emergency department for treatment and evaluation of continued viral symptoms.  He was diagnosed with COVID-19 2 weeks ago and continues to have a cough.  He has had no fever, shortness of breath, nausea, vomiting, diarrhea, or fever.  He was advised by his workplace that he will need a negative COVID screen before he may return.  He is also complaining of bilateral foot pain that has been chronic.  He denies worsening symptoms or new injury.  Past Medical History:  Diagnosis Date   Anxiety    Asthma    Bipolar 1 disorder (HCC)    Chronic hepatitis C without hepatic coma (HCC) 05/28/2018   Depression    Hepatitis C    Hepatitis C antibody positive in blood 05/28/2018   Schizophrenia (HCC)    Sleep apnea     Patient Active Problem List   Diagnosis Date Noted   COVID-19 11/19/2020   Schizophrenia, 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   Schizophrenia, chronic condition (HCC) 01/14/2019   Alcohol intoxication, uncomplicated (HCC) 01/13/2019   Asthma 11/25/2018   Nicotine dependence, cigarettes, uncomplicated 11/25/2018   Chronic hepatitis C without hepatic coma (HCC) 05/28/2018   Alcohol abuse 02/16/2016   Cocaine abuse (HCC) 02/16/2016    Past Surgical History:  Procedure Laterality Date   CYST REMOVAL NECK     neck    Prior to Admission medications   Medication Sig Start Date End Date Taking? Authorizing Provider  ARIPiprazole (ABILIFY) 10 MG tablet Take 1 tablet (10 mg total) by mouth daily. 04/29/21   Nwoko, Tommas Olp, PA  brompheniramine-pseudoephedrine-DM 30-2-10 MG/5ML syrup Take 10 mLs by mouth 4 (four) times daily as needed. 06/29/21   Doren Kaspar, Rulon Eisenmenger B, FNP  naproxen  (NAPROSYN) 500 MG tablet Take 1 tablet (500 mg total) by mouth 2 (two) times daily with a meal. 06/29/21   Kaidyn Javid B, FNP  ondansetron (ZOFRAN ODT) 4 MG disintegrating tablet Take 1 tablet (4 mg total) by mouth every 8 (eight) hours as needed for nausea or vomiting. 01/12/21   Irean Hong, MD  traZODone (DESYREL) 100 MG tablet Take 1 tablet (100 mg total) by mouth at bedtime. 04/29/21   Meta Hatchet, PA    Allergies Patient has no known allergies.  Family History  Problem Relation Age of Onset   Hypertension Sister    Schizophrenia Sister     Social History Social History   Tobacco Use   Smoking status: Some Days    Packs/day: 1.00    Types: Cigarettes   Smokeless tobacco: Never  Vaping Use   Vaping Use: Never used  Substance Use Topics   Alcohol use: Yes    Alcohol/week: 3.0 standard drinks    Types: 3 Shots of liquor per week   Drug use: Yes    Types: Cocaine, Marijuana    Comment: last used today    Review of Systems Constitutional: Negative for fever/chills.  Normal appetite. ENT: Negative sore throat. Cardiovascular: Denies chest pain. Respiratory: No shortness of breath.  Positive for for cough.  Negative wheezing.  Gastrointestinal: No nausea, no vomiting.  No diarrhea.  Musculoskeletal: Positive for body aches Skin: Negative for rash. Neurological: Positive for headaches ____________________________________________   PHYSICAL EXAM:  VITAL SIGNS: ED Triage Vitals  Enc Vitals Group     BP 07/13/21 1426 119/77     Pulse Rate 07/13/21 1427 70     Resp 07/13/21 1425 18     Temp 07/13/21 1425 98.4 F (36.9 C)     Temp Source 07/13/21 1425 Oral     SpO2 07/13/21 1425 96 %     Weight 07/13/21 1425 225 lb (102.1 kg)     Height 07/13/21 1425 6\' 1"  (1.854 m)     Head Circumference --      Peak Flow --      Pain Score 07/13/21 1425 10     Pain Loc --      Pain Edu? --      Excl. in GC? --     Constitutional: Alert and oriented.  Overall well  appearing and in no acute distress. Eyes: Conjunctivae are normal. Ears: Exam deferred Nose: No sinus congestion noted; no rhinnorhea. Mouth/Throat: Mucous membranes are moist.  Oropharynx normal. Tonsils not visualized. Uvula midline. Neck: No stridor.  Lymphatic: No cervical lymphadenopathy. Cardiovascular: Normal rate, regular rhythm. Good peripheral circulation. Respiratory: Respirations are even and unlabored.  No retractions.  Breath sounds clear to auscultation. Gastrointestinal: Soft and nontender.  Musculoskeletal: FROM x 4 extremities.  Neurologic:  Normal speech and language. Skin:  Skin is warm, dry and intact. No rash noted. Psychiatric: Mood and affect are normal. Speech and behavior are normal.  ____________________________________________   LABS (all labs ordered are listed, but only abnormal results are displayed)  Labs Reviewed  RESP PANEL BY RT-PCR (FLU A&B, COVID) ARPGX2   ____________________________________________  EKG  Not indicated ____________________________________________  RADIOLOGY  Not indicated ____________________________________________   PROCEDURES  Procedure(s) performed: None  Critical Care performed: No ____________________________________________   INITIAL IMPRESSION / ASSESSMENT AND PLAN / ED COURSE  45 y.o. male presenting for repeat COVID test.  See HPI for further details.  COVID test collected and the patient will get the results via MyChart.  He was given a work excuse to return per 59.  He was advised to take the Naprosyn that was prescribed 2 weeks ago for his body aches and pain.  Medications - No data to display  ED Discharge Orders     None        Pertinent labs & imaging results that were available during my care of the patient were reviewed by me and considered in my medical decision making (see chart for details).    If controlled substance prescribed during this visit, 12 month history  viewed on the NCCSRS prior to issuing an initial prescription for Schedule II or III opiod. ____________________________________________   FINAL CLINICAL IMPRESSION(S) / ED DIAGNOSES  Final diagnoses:  Cough  Musculoskeletal pain    Note:  This document was prepared using Dragon voice recognition software and may include unintentional dictation errors.     Cytogeneticist, FNP 07/13/21 1856    07/15/21, MD 07/13/21 [phone]

## 2021-07-15 ENCOUNTER — Encounter: Payer: Self-pay | Admitting: Psychiatry

## 2021-07-15 ENCOUNTER — Other Ambulatory Visit: Payer: Self-pay

## 2021-07-15 ENCOUNTER — Inpatient Hospital Stay
Admission: AD | Admit: 2021-07-15 | Discharge: 2021-07-18 | DRG: 885 | Disposition: A | Discharge status: Home / Self Care | Payer: Medicaid Other | Source: Intra-hospital | Attending: Psychiatry | Admitting: Psychiatry

## 2021-07-15 ENCOUNTER — Encounter: Payer: Self-pay | Admitting: Emergency Medicine

## 2021-07-15 ENCOUNTER — Emergency Department (EMERGENCY_DEPARTMENT_HOSPITAL)
Admission: EM | Admit: 2021-07-15 | Discharge: 2021-07-15 | Disposition: A | Discharge status: Other Acute Inpatient Hospital | Payer: Medicaid Other | Source: Home / Self Care | Attending: Emergency Medicine | Admitting: Emergency Medicine

## 2021-07-15 ENCOUNTER — Other Ambulatory Visit (HOSPITAL_COMMUNITY): Payer: Self-pay

## 2021-07-15 DIAGNOSIS — F99 Mental disorder, not otherwise specified: Secondary | ICD-10-CM

## 2021-07-15 DIAGNOSIS — Z9119 Patient's noncompliance with other medical treatment and regimen: Secondary | ICD-10-CM

## 2021-07-15 DIAGNOSIS — S01112A Laceration without foreign body of left eyelid and periocular area, initial encounter: Secondary | ICD-10-CM

## 2021-07-15 DIAGNOSIS — F1410 Cocaine abuse, uncomplicated: Secondary | ICD-10-CM | POA: Diagnosis present

## 2021-07-15 DIAGNOSIS — F203 Undifferentiated schizophrenia: Secondary | ICD-10-CM

## 2021-07-15 DIAGNOSIS — Z79899 Other long term (current) drug therapy: Secondary | ICD-10-CM

## 2021-07-15 DIAGNOSIS — R45851 Suicidal ideations: Secondary | ICD-10-CM | POA: Diagnosis present

## 2021-07-15 DIAGNOSIS — F1010 Alcohol abuse, uncomplicated: Secondary | ICD-10-CM | POA: Diagnosis present

## 2021-07-15 DIAGNOSIS — Z8616 Personal history of COVID-19: Secondary | ICD-10-CM | POA: Insufficient documentation

## 2021-07-15 DIAGNOSIS — F17210 Nicotine dependence, cigarettes, uncomplicated: Secondary | ICD-10-CM | POA: Diagnosis present

## 2021-07-15 DIAGNOSIS — J45909 Unspecified asthma, uncomplicated: Secondary | ICD-10-CM | POA: Insufficient documentation

## 2021-07-15 DIAGNOSIS — X781XXA Intentional self-harm by knife, initial encounter: Secondary | ICD-10-CM | POA: Diagnosis present

## 2021-07-15 DIAGNOSIS — Z79891 Long term (current) use of opiate analgesic: Secondary | ICD-10-CM | POA: Diagnosis not present

## 2021-07-15 DIAGNOSIS — R443 Hallucinations, unspecified: Secondary | ICD-10-CM

## 2021-07-15 DIAGNOSIS — F419 Anxiety disorder, unspecified: Secondary | ICD-10-CM | POA: Diagnosis present

## 2021-07-15 DIAGNOSIS — Y900 Blood alcohol level of less than 20 mg/100 ml: Secondary | ICD-10-CM | POA: Insufficient documentation

## 2021-07-15 DIAGNOSIS — F209 Schizophrenia, unspecified: Secondary | ICD-10-CM | POA: Insufficient documentation

## 2021-07-15 DIAGNOSIS — F5105 Insomnia due to other mental disorder: Secondary | ICD-10-CM

## 2021-07-15 DIAGNOSIS — G4730 Sleep apnea, unspecified: Secondary | ICD-10-CM | POA: Diagnosis present

## 2021-07-15 DIAGNOSIS — F319 Bipolar disorder, unspecified: Secondary | ICD-10-CM | POA: Diagnosis present

## 2021-07-15 DIAGNOSIS — Z20822 Contact with and (suspected) exposure to covid-19: Secondary | ICD-10-CM | POA: Insufficient documentation

## 2021-07-15 DIAGNOSIS — Z9151 Personal history of suicidal behavior: Secondary | ICD-10-CM | POA: Diagnosis not present

## 2021-07-15 DIAGNOSIS — Z791 Long term (current) use of non-steroidal anti-inflammatories (NSAID): Secondary | ICD-10-CM

## 2021-07-15 DIAGNOSIS — R4589 Other symptoms and signs involving emotional state: Secondary | ICD-10-CM

## 2021-07-15 DIAGNOSIS — B182 Chronic viral hepatitis C: Secondary | ICD-10-CM | POA: Diagnosis present

## 2021-07-15 DIAGNOSIS — Z818 Family history of other mental and behavioral disorders: Secondary | ICD-10-CM

## 2021-07-15 DIAGNOSIS — Z046 Encounter for general psychiatric examination, requested by authority: Secondary | ICD-10-CM | POA: Insufficient documentation

## 2021-07-15 LAB — URINALYSIS, COMPLETE (UACMP) WITH MICROSCOPIC
Bacteria, UA: NONE SEEN
Bilirubin Urine: NEGATIVE
Glucose, UA: NEGATIVE mg/dL
Hgb urine dipstick: NEGATIVE
Ketones, ur: NEGATIVE mg/dL
Nitrite: NEGATIVE
Protein, ur: 30 mg/dL — AB
Specific Gravity, Urine: 1.021 (ref 1.005–1.030)
pH: 6 (ref 5.0–8.0)

## 2021-07-15 LAB — URINE DRUG SCREEN, QUALITATIVE (ARMC ONLY)
Amphetamines, Ur Screen: NOT DETECTED
Barbiturates, Ur Screen: NOT DETECTED
Benzodiazepine, Ur Scrn: NOT DETECTED
Cannabinoid 50 Ng, Ur ~~LOC~~: POSITIVE — AB
Cocaine Metabolite,Ur ~~LOC~~: POSITIVE — AB
MDMA (Ecstasy)Ur Screen: NOT DETECTED
Methadone Scn, Ur: NOT DETECTED
Opiate, Ur Screen: NOT DETECTED
Phencyclidine (PCP) Ur S: NOT DETECTED
Tricyclic, Ur Screen: NOT DETECTED

## 2021-07-15 LAB — RESP PANEL BY RT-PCR (FLU A&B, COVID) ARPGX2
Influenza A by PCR: NEGATIVE
Influenza B by PCR: NEGATIVE
SARS Coronavirus 2 by RT PCR: NEGATIVE

## 2021-07-15 LAB — CBC WITH DIFFERENTIAL/PLATELET
Abs Immature Granulocytes: 0.26 10*3/uL — ABNORMAL HIGH (ref 0.00–0.07)
Basophils Absolute: 0 10*3/uL (ref 0.0–0.1)
Basophils Relative: 0 %
Eosinophils Absolute: 0 10*3/uL (ref 0.0–0.5)
Eosinophils Relative: 0 %
HCT: 35.6 % — ABNORMAL LOW (ref 39.0–52.0)
Hemoglobin: 13.3 g/dL (ref 13.0–17.0)
Immature Granulocytes: 1 %
Lymphocytes Relative: 15 %
Lymphs Abs: 2.8 10*3/uL (ref 0.7–4.0)
MCH: 30.2 pg (ref 26.0–34.0)
MCHC: 37.4 g/dL — ABNORMAL HIGH (ref 30.0–36.0)
MCV: 80.9 fL (ref 80.0–100.0)
Monocytes Absolute: 1.1 10*3/uL — ABNORMAL HIGH (ref 0.1–1.0)
Monocytes Relative: 6 %
Neutro Abs: 14.3 10*3/uL — ABNORMAL HIGH (ref 1.7–7.7)
Neutrophils Relative %: 78 %
Platelets: 216 10*3/uL (ref 150–400)
RBC: 4.4 MIL/uL (ref 4.22–5.81)
RDW: 13.6 % (ref 11.5–15.5)
WBC: 18.6 10*3/uL — ABNORMAL HIGH (ref 4.0–10.5)
nRBC: 0 % (ref 0.0–0.2)

## 2021-07-15 LAB — COMPREHENSIVE METABOLIC PANEL
ALT: 31 U/L (ref 0–44)
AST: 38 U/L (ref 15–41)
Albumin: 3.7 g/dL (ref 3.5–5.0)
Alkaline Phosphatase: 39 U/L (ref 38–126)
Anion gap: 6 (ref 5–15)
BUN: 15 mg/dL (ref 6–20)
CO2: 24 mmol/L (ref 22–32)
Calcium: 8.7 mg/dL — ABNORMAL LOW (ref 8.9–10.3)
Chloride: 106 mmol/L (ref 98–111)
Creatinine, Ser: 0.78 mg/dL (ref 0.61–1.24)
GFR, Estimated: >60 mL/min (ref >60)
Glucose, Bld: 90 mg/dL (ref 70–99)
Potassium: 3.7 mmol/L (ref 3.5–5.1)
Sodium: 136 mmol/L (ref 135–145)
Total Bilirubin: 1.1 mg/dL (ref 0.3–1.2)
Total Protein: 7.5 g/dL (ref 6.5–8.1)

## 2021-07-15 LAB — SALICYLATE LEVEL: Salicylate Lvl: <7 mg/dL — ABNORMAL LOW (ref 7.0–30.0)

## 2021-07-15 LAB — ETHANOL: Alcohol, Ethyl (B): <10 mg/dL (ref <10)

## 2021-07-15 LAB — ACETAMINOPHEN LEVEL: Acetaminophen (Tylenol), Serum: <10 ug/mL — ABNORMAL LOW (ref 10–30)

## 2021-07-15 MED ORDER — NICOTINE 21 MG/24HR TD PT24
21.0000 mg | MEDICATED_PATCH | Freq: Every day | TRANSDERMAL | Status: DC
  Administered 2021-07-15: 21 mg via TRANSDERMAL
  Filled 2021-07-15: qty 1

## 2021-07-15 MED ORDER — ACETAMINOPHEN 325 MG PO TABS
650.0000 mg | ORAL_TABLET | Freq: Four times a day (QID) | ORAL | Status: DC | PRN
  Administered 2021-07-16 (×2): 650 mg via ORAL
  Filled 2021-07-15 (×2): qty 2

## 2021-07-15 MED ORDER — MAGNESIUM HYDROXIDE 400 MG/5ML PO SUSP: 30.0000 mL | Freq: Every day | ORAL | Status: DC | PRN

## 2021-07-15 MED ORDER — NICOTINE 21 MG/24HR TD PT24
21.0000 mg | MEDICATED_PATCH | Freq: Every day | TRANSDERMAL | Status: DC
  Filled 2021-07-15: qty 1

## 2021-07-15 MED ORDER — FLUORESCEIN SODIUM 1 MG OP STRP
1.0000 | ORAL_STRIP | Freq: Once | OPHTHALMIC | Status: AC
  Administered 2021-07-15: 1 via OPHTHALMIC
  Filled 2021-07-15: qty 1

## 2021-07-15 MED ORDER — ARIPIPRAZOLE 10 MG PO TABS
20.0000 mg | ORAL_TABLET | Freq: Every day | ORAL | Status: DC
  Administered 2021-07-16 – 2021-07-18 (×3): 20 mg via ORAL
  Filled 2021-07-15 (×3): qty 2

## 2021-07-15 MED ORDER — HYDROXYZINE HCL 50 MG PO TABS
50.0000 mg | ORAL_TABLET | Freq: Three times a day (TID) | ORAL | Status: DC | PRN
  Administered 2021-07-16 – 2021-07-17 (×2): 50 mg via ORAL
  Filled 2021-07-15 (×3): qty 1

## 2021-07-15 MED ORDER — TRAZODONE HCL 100 MG PO TABS
100.0000 mg | ORAL_TABLET | Freq: Every day | ORAL | Status: DC
  Administered 2021-07-15 – 2021-07-17 (×3): 100 mg via ORAL
  Filled 2021-07-15 (×3): qty 1

## 2021-07-15 MED ORDER — ARIPIPRAZOLE 10 MG PO TABS
20.0000 mg | ORAL_TABLET | Freq: Every day | ORAL | Status: DC
  Administered 2021-07-15: 20 mg via ORAL
  Filled 2021-07-15: qty 2

## 2021-07-15 MED ORDER — ALUM & MAG HYDROXIDE-SIMETH 200-200-20 MG/5ML PO SUSP: 30.0000 mL | ORAL | Status: DC | PRN

## 2021-07-15 NOTE — Plan of Care (Signed)
  Problem: Activity: Goal: Will verbalize the importance of balancing activity with adequate rest periods Outcome: Not Progressing   Problem: Education: Goal: Will be free of psychotic symptoms Outcome: Not Progressing Goal: Knowledge of the prescribed therapeutic regimen will improve Outcome: Not Progressing   Problem: Coping: Goal: Coping ability will improve Outcome: Not Progressing   Problem: Education: Goal: Knowledge of [facility] Education information/materials will improve Outcome: Not Progressing Goal: Emotional status will improve Outcome: Not Progressing Goal: Mental status will improve Outcome: Not Progressing Goal: Verbalization of understanding the information provided will improve Outcome: Not Progressing   Problem: Coping: Goal: Ability to verbalize frustrations and anger appropriately will improve Outcome: Not Progressing Goal: Ability to demonstrate self-control will improve Outcome: Not Progressing   Problem: Health Behavior/Discharge Planning: Goal: Identification of resources available to assist in meeting health care needs will improve Outcome: Not Progressing Goal: Compliance with treatment plan for underlying cause of condition will improve Outcome: Not Progressing

## 2021-07-15 NOTE — ED Provider Notes (Signed)
[facility] Emergency Department Provider Note   ____________________________________________   Event Date/Time   First MD Initiated Contact with Patient 07/15/21 [phone]     (approximate)  I have reviewed the triage vital signs and the nursing notes.   HISTORY  Chief Complaint Psychiatric Evaluation    HPI Dominic [name] is a 45 y.o. male with past medical history of schizophrenia and polysubstance abuse who presents to the ED for psychiatric evaluation.  Patient reports that he has been hearing voices, is unable to specify what they have been saying to him.  He states that he was watching a movie last night and felt that there could be something in his eye.  He admits to taking a knife to his eye but is unable to state his intent in doing so.  He reports pain in his left eye but denies any trouble with his vision.  He states "they drugged me up" and states that people are after him and trying to kill him.  He denies any drug use, states he has been off his medications for least the past month.  He does admit to daily alcohol consumption, states he did not have anything to drink last night.        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 Authorizing Provider  ARIPiprazole (ABILIFY) 10 MG tablet Take 1 tablet (10 mg total) by mouth daily. 04/29/21   Nwoko, Tommas Olp, PA  brompheniramine-pseudoephedrine-DM 30-2-10 MG/5ML syrup Take 10 mLs by mouth 4 (four) times daily as needed. 06/29/21   Triplett, Rulon Eisenmenger B, FNP  naproxen (NAPROSYN) 500 MG tablet Take 1 tablet (500 mg total) by mouth 2 (two) times daily with a meal. 06/29/21   Triplett, Cari B, FNP  ondansetron (ZOFRAN ODT) 4 MG disintegrating tablet Take 1 tablet (4 mg total) by mouth every 8 (eight) hours as needed for nausea or vomiting. 01/12/21   Irean Hong, MD  traZODone (DESYREL) 100 MG tablet Take 1 tablet (100 mg total) by mouth at bedtime. 04/29/21   Meta Hatchet, PA    Allergies Patient has no known allergies.  Family History  Problem Relation Age of Onset   Hypertension Sister    Schizophrenia Sister     Social History Social History   Tobacco Use   Smoking status: Some Days    Packs/day: 1.00    Types: Cigarettes   Smokeless tobacco: Never  Vaping Use   Vaping Use: Never used  Substance Use Topics   Alcohol use: Yes    Alcohol/week: 3.0 standard drinks  Types: 3 Shots of liquor per week   Drug use: Yes    Types: Cocaine, Marijuana    Comment: last used today    Review of Systems  Constitutional: No fever/chills Eyes: No visual changes.  Positive for left eye pain. ENT: No sore throat. Cardiovascular: Denies chest pain. Respiratory: Denies shortness of breath. Gastrointestinal: No abdominal pain.  No nausea, no vomiting.  No diarrhea.  No constipation. Genitourinary: Negative for dysuria. Musculoskeletal: Negative for back pain. Skin: Negative for rash. Neurological: Negative for headaches, focal weakness or numbness.  ____________________________________________   PHYSICAL EXAM:  VITAL SIGNS: ED Triage Vitals [07/15/21 0727]   Enc Vitals Group     BP 136/72     Pulse Rate 69     Resp 20     Temp 98.8 F (37.1 C)     Temp Source Oral     SpO2 96 %     Weight      Height      Head Circumference      Peak Flow      Pain Score      Pain Loc      Pain Edu?      Excl. in GC?     Constitutional: Alert and oriented. Eyes: Conjunctivae are normal.  Superficial laceration to left eyelid.  Pupils equal, round, and reactive to light bilaterally.  No fluorescein uptake noted to left eye, negative Seidel sign. Head: Atraumatic. Nose: No congestion/rhinnorhea. Mouth/Throat: Mucous membranes are moist. Neck: Normal ROM Cardiovascular: Normal rate, regular rhythm. Grossly normal heart sounds. Respiratory: Normal respiratory effort.  No retractions. Lungs CTAB. Gastrointestinal: Soft and nontender. No distention. Genitourinary: deferred Musculoskeletal: No lower extremity tenderness nor edema. Neurologic:  Normal speech and language. No gross focal neurologic deficits are appreciated. Skin:  Skin is warm, dry and intact. No rash noted. Psychiatric: Mood and affect are normal. Speech and behavior are normal.  ____________________________________________   LABS (all labs ordered are listed, but only abnormal results are displayed)  Labs Reviewed  CBC WITH DIFFERENTIAL/PLATELET - Abnormal; Notable for the following components:      Result Value   WBC 18.6 (*)    HCT 35.6 (*)    MCHC 37.4 (*)    Neutro Abs 14.3 (*)    Monocytes Absolute 1.1 (*)    Abs Immature Granulocytes 0.26 (*)    All other components within normal limits  COMPREHENSIVE METABOLIC PANEL - Abnormal; Notable for the following components:   Calcium 8.7 (*)    All other components within normal limits  SALICYLATE LEVEL - Abnormal; Notable for the following components:   Salicylate Lvl <7.0 (*)    All other components within normal limits  ACETAMINOPHEN LEVEL - Abnormal; Notable for the following components:   Acetaminophen (Tylenol),  Serum <10 (*)    All other components within normal limits  RESP PANEL BY RT-PCR (FLU A&B, COVID) ARPGX2  ETHANOL  URINALYSIS, COMPLETE (UACMP) WITH MICROSCOPIC  URINE DRUG SCREEN, QUALITATIVE ([facility] ONLY)   ____________________________________________  EKG  ED ECG REPORT I, Chesley Noon, the attending physician, personally viewed and interpreted this ECG.   Date: 07/15/2021  EKG Time: 7:30  Rate: 72  Rhythm: normal sinus rhythm  Axis: Normal  Intervals:none  ST&T Change: None   PROCEDURES  Procedure(s) performed (including Critical Care):  Procedures   ____________________________________________   INITIAL IMPRESSION / ASSESSMENT AND PLAN / ED COURSE      45 year old male with past medical history of schizophrenia and polysubstance  abuse who presents to the ED with auditory hallucinations and attempted injury of left eye.  Eye trauma appears limited to the eyelid itself, no fluorescein uptake noted on exam and patient with negative Seidel sign.  Pupil is regular and reactive, no evidence to suggest ruptured globe at this time or significant corneal abrasion.  Patient does have superficial laceration to his left eyelid that does not require repair.  He was placed under IVC by [facility] [employer] due to self-harm, patient may be medically cleared for psychiatric evaluation pending labs.  Screening labs are unremarkable, patient may be medically cleared for psychiatric evaluation.  The patient has been placed in psychiatric observation due to the need to provide a safe environment for the patient while obtaining psychiatric consultation and evaluation, as well as ongoing medical and medication management to treat the patient's condition.  The patient has been placed under full IVC at this time.       ____________________________________________   FINAL CLINICAL IMPRESSION(S) / ED DIAGNOSES  Final diagnoses:  Hallucinations  Thoughts of self harm     ED Discharge  Orders     None        Note:  This document was prepared using Dragon voice recognition software and may include unintentional dictation errors.    Chesley Noon, MD 07/15/21 [phone]

## 2021-07-15 NOTE — ED Triage Notes (Signed)
Pt arrives via EMS with reports of schizophrenia but not been on medications for over a year. Pt attempted to use a butcher knife to remove his left eye last night. Police at bedside who are working on [employer] paperwork.

## 2021-07-15 NOTE — BH Assessment (Signed)
Comprehensive Clinical Assessment (CCA) Note  07/15/2021 Dominic [name] [id number]  Chief Complaint: No chief complaint on file.  Visit Diagnosis: Schizophrenia   Dominic [name] is a 45 year old male who presents to the ER because of attempts to end his life. Per the report of the police, the patient was trying to cut his eye out. Patient states he was upset because of the voices are getting louder and he was no longer able to manage them. He further reported it was his goal to end his life and he continues to feel that way. He states he hasn't been on his medications for over a year and was doing well, until a couple of weeks ago. He admits to the use of mind-altering substances. Alcohol, cocaine and cannabis.  During the interview the patient was calm, cooperative and pleasant. He was able to provide appropriate answers to the questions. He denies involvement with the legal system and denies history of violence, unless provoked. It was usually someone he was doing drugs with. Throughout the interview, he denies HI.  CCA Screening, Triage and Referral (STR)  Patient Reported Information How did you hear about [place]? Family/Friend  What Is the Reason for Your Visit/Call Today? Girlfriend believes the patient was danger to himself and others because he's not takin his medications  How Long Has This Been Causing You Problems? > than 6 months  What Do You Feel Would Help You the Most Today? Alcohol or Drug Use Treatment; Treatment for Depression or other mood problem   Have You Recently Had Any Thoughts About Hurting Yourself? No  Are You Planning to Commit Suicide/Harm Yourself At This time? No   Have you Recently Had Thoughts About Hurting Someone [name]? No  Are You Planning to Harm Someone at This Time? No  Explanation: No data recorded  Have You Used Any Alcohol or Drugs in the Past 24 Hours? Yes  How Long Ago Did You Use Drugs or Alcohol? No data recorded What Did You Use and  How Much? Cocaine   Do You Currently Have a Therapist/Psychiatrist? No  Name of Therapist/Psychiatrist: No data recorded  Have You Been Recently Discharged From Any Office Practice or Programs? No  Explanation of Discharge From Practice/Program: No data recorded    CCA Screening Triage Referral Assessment Type of Contact: Face-to-Face  Telemedicine Service Delivery:   Is this Initial or Reassessment? No data recorded Date Telepsych consult ordered in [facility]:  08/24/20  Time Telepsych consult ordered in [facility]:  0915  Location of Assessment: [facility] ED  Provider Location: [facility] ED   Collateral Involvement: Girlfriend   Does Patient Have a Court Appointed Legal Guardian? No data recorded Name and Contact of Legal Guardian: Self  If Minor and Not Living with Parent(s), Who has Custody? n/a  Is CPS involved or ever been involved? Never  Is APS involved or ever been involved? Never   Patient Determined To Be At Risk for Harm To Self or Others Based on Review of Patient Reported Information or Presenting Complaint? No  Method: No data recorded Availability of Means: No data recorded Intent: No data recorded Notification Required: No data recorded Additional Information for Danger to Others Potential: No data recorded Additional Comments for Danger to Others Potential: No data recorded Are There Guns or Other Weapons in Your Home? No data recorded Types of Guns/Weapons: No data recorded Are These Weapons Safely Secured?  No data recorded Who Could Verify You Are Able To Have These Secured: No data recorded Do You Have any Outstanding Charges, Pending Court Dates, Parole/Probation? No data recorded Contacted To Inform of Risk of Harm To Self or Others: No data recorded   Does Patient Present under Involuntary Commitment? Yes  IVC Papers Initial File Date: 06/11/21   [place] of Residence: [place]   Patient Currently Receiving the Following  Services: Not Receiving Services   Determination of Need: Emergent (2 hours)   Options For Referral: ED Visit     CCA Biopsychosocial Patient Reported Schizophrenia/Schizoaffective Diagnosis in Past: Yes   Strengths: Have a support system, have some insight into his mental illness and able to take care of his basic needs   Mental Health Symptoms Depression:   None   Duration of Depressive symptoms:    Mania:   None   Anxiety:    None   Psychosis:   None   Duration of Psychotic symptoms:    Trauma:   None   Obsessions:   None   Compulsions:   None   Inattention:   None   Hyperactivity/Impulsivity:   None   Oppositional/Defiant Behaviors:   None   Emotional Irregularity:   None   Other Mood/Personality Symptoms:  No data recorded   Mental Status Exam Appearance and self-care  Stature:   Average   Weight:   Average weight   Clothing:   Disheveled   Grooming:   Neglected   Cosmetic use:   None   Posture/gait:   Normal   Motor activity:   Restless   Sensorium  Attention:   Distractible   Concentration:   Scattered   Orientation:   X5   Recall/memory:   Normal   Affect and Mood  Affect:   Appropriate; Full Range   Mood:   Irritable   Relating  Eye contact:   Fleeting   Facial expression:   Responsive   Attitude toward examiner:   Cooperative   Thought and Language  Speech flow:  Clear and Coherent   Thought content:   Appropriate to Mood and Circumstances   Preoccupation:   Other (Comment)   Hallucinations:   None   Organization:  No data recorded  [employer] of Knowledge:   Fair   Intelligence:   Average   Abstraction:   Normal; Functional   Judgement:   Fair; Impaired   Reality Testing:   Realistic; Adequate   Insight:   Fair   Decision Making:   Impulsive   Social Functioning  Social Maturity:   Impulsive   Social Judgement:   Chemical engineer"; Normal    Stress  Stressors:   Family conflict; Transitions; Relationship   Coping Ability:   Exhausted   Skill Deficits:   None   Supports:   Family     Religion:    Leisure/Recreation:    Exercise/Diet:     CCA Employment/Education Employment/Work Situation: Employment / Work Systems developer: Unemployed  Education: Education Is Patient Currently Attending School?: No Did You Have An Individualized Education Program (IIEP): No Did You Have Any Difficulty At [employer]?: No Patient's Education Has Been Impacted by Current Illness: No   CCA Family/Childhood History Family and Relationship History: Family history Marital status: Long term relationship Does patient have children?: Yes How many children?: 2 How is patient's relationship with their children?: States it's good relationship  Childhood History:  Childhood History By whom was/is the patient raised?: Mother  Did patient suffer any verbal/emotional/physical/sexual abuse as a child?: No Did patient suffer from severe childhood neglect?: No Has patient ever been sexually abused/assaulted/raped as an adolescent or adult?: No Was the patient ever a victim of a crime or a disaster?: No Witnessed domestic violence?: No Has patient been affected by domestic violence as an adult?: No  Child/Adolescent Assessment:    CCA Substance Use Alcohol/Drug Use: Alcohol / Drug Use Pain Medications: See PTA Prescriptions: See PTA Over the Counter: See PTA History of alcohol / drug use?: Yes Longest period of sobriety (when/how long): Unable to quantify Negative Consequences of Use: Personal relationships, Work / School Substance #1 Name of Substance 1: Alcohol 1 - Last Use / Amount: 07/15/2021 Substance #2 Name of Substance 2: Cocaine 2 - Last Use / Amount: 07/15/2021 Substance #3 Name of Substance 3: Cannabis 3 - Last Use / Amount: 07/14/2021  ASAM's:  Six Dimensions of Multidimensional  Assessment  Dimension 1:  Acute Intoxication and/or Withdrawal Potential:      Dimension 2:  Biomedical Conditions and Complications:      Dimension 3:  Emotional, Behavioral, or Cognitive Conditions and Complications:     Dimension 4:  Readiness to Change:     Dimension 5:  Relapse, Continued use, or Continued Problem Potential:     Dimension 6:  Recovery/Living Environment:     ASAM Severity Score:    ASAM Recommended Level of Treatment:     Substance use Disorder (SUD)    Recommendations for Services/Supports/Treatments:    Discharge Disposition:    DSM5 Diagnoses: Patient Active Problem List   Diagnosis Date Noted   Left eyelid laceration 07/15/2021   COVID-19 11/19/2020   Schizophrenia, 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   Schizophrenia, chronic condition (HCC) 01/14/2019   Alcohol intoxication, uncomplicated (HCC) 01/13/2019   Asthma 11/25/2018   Nicotine dependence, cigarettes, uncomplicated 11/25/2018   Chronic hepatitis C without hepatic coma (HCC) 05/28/2018   Alcohol abuse 02/16/2016   Cocaine abuse (HCC) 02/16/2016    Referrals to Alternative Service(s): Referred to Alternative Service(s):   Place:   Date:   Time:    Referred to Alternative Service(s):   Place:   Date:   Time:    Referred to Alternative Service(s):   Place:   Date:   Time:    Referred to Alternative Service(s):   Place:   Date:   Time:     Lilyan Gilford MS, LCAS, [facility], [facility] Therapeutic Triage Specialist 07/15/2021 1:07 PM

## 2021-07-15 NOTE — BH Assessment (Signed)
Admission Progress Note  Patient IVC'd for SI attempt by stabbing himself in the left eye. Admitted to behavioral health unit AA 0 x 3. Cooperative yet, anxious and fidgety during admission. Disheveled in appearance. Endorses SI and Auditory hallucinations. Denies plan and agrees to remain safe while in the hospital. Denies voices as command hallucinations. Denies HI/VH. Reports not sleeping in the past week and drinking alcohol daily. States, "I tried to hurt myself because I someone took my money off my card." Informs RN that he wants to improve for his daughters who are 10 and 36. Reports support as mother. Endorses pain in Left eye and bilateral legs and feet. See pain assessment. Refuses medication at time because he wants to eat dinner. Skin assessment performed by RN's x2. Patient oriented to unit and unit rules. Verbalized understanding and compliance. Will continue q15 minute check for safety.

## 2021-07-15 NOTE — Consult Note (Signed)
[facility] Face-to-Face Psychiatry Consult   Reason for Consult: Consult for 45 year old man with a history of schizophrenia and substance abuse came into the hospital with self-injury and suicidal ideation Referring Physician: Su Hoff Patient Identification: Dominic [name] MRN:  [id number] Principal Diagnosis: Schizophrenia, undifferentiated (HCC) Diagnosis:  Principal Problem:   Schizophrenia, undifferentiated (HCC) Active Problems:   Alcohol abuse   Cocaine abuse (HCC)   Nicotine dependence, cigarettes, uncomplicated   Left eyelid laceration   Total Time spent with patient: 1 hour  Subjective:   Dominic [name] is a 45 y.o. male patient admitted with "I need my medicine".  HPI: Patient seen chart reviewed.  Patient well known from prior encounters.  45 year old man with a history of schizophrenia and substance abuse.  Patient states he has not been following up with outpatient mental health treatment and that he has not been on his medication for "a year".  He says he has been using cocaine regularly and also drinking regularly although he was not drinking last night.  Patient says he was at home watching television and tried to cut his left eyeball out with a butcher knife.  Managed to give himself a laceration on the left eyelid.  Endorses auditory hallucinations telling him to do things.  Mood dysphoric anxious and confused.  Denies homicidal ideation.  Patient is currently calm and cooperative.  Past Psychiatric History: Long history of schizophrenia or schizoaffective disorder as well as cocaine and alcohol abuse.  Many prior hospital stays.  Does well on long-acting injectables but has had compliance issues.  Has had some issues of self-injury and suicidal ideation in the past  Risk to Self:   Risk to Others:   Prior Inpatient Therapy:   Prior Outpatient Therapy:    Past Medical History:  Past Medical History:  Diagnosis Date   Anxiety    Asthma    Bipolar 1 disorder (HCC)     Chronic hepatitis C without hepatic coma (HCC) 05/28/2018   Depression    Hepatitis C    Hepatitis C antibody positive in blood 05/28/2018   Schizophrenia (HCC)    Sleep apnea     Past Surgical History:  Procedure Laterality Date   CYST REMOVAL NECK     neck   Family History:  Family History  Problem Relation Age of Onset   Hypertension Sister    Schizophrenia Sister    Family Psychiatric  History: Schizophrenia in close family members Social History:  Social History   Substance and Sexual Activity  Alcohol Use Yes   Alcohol/week: 3.0 standard drinks   Types: 3 Shots of liquor per week     Social History   Substance and Sexual Activity  Drug Use Yes   Types: Cocaine, Marijuana   Comment: last used today    Social History   Socioeconomic History   Marital status: Single    Spouse name: Not on file   Number of children: Not on file   Years of education: Not on file   Highest education level: Not on file  Occupational History   Not on file  Tobacco Use   Smoking status: Some Days    Packs/day: 1.00    Types: Cigarettes   Smokeless tobacco: Never  Vaping Use   Vaping Use: Never used  Substance and Sexual Activity   Alcohol use: Yes    Alcohol/week: 3.0 standard drinks    Types: 3 Shots of liquor per week   Drug use: Yes    Types:  Cocaine, Marijuana    Comment: last used today   Sexual activity: Not Currently  Other Topics Concern   Not on file  Social History Narrative   Not on file   Social Determinants of Health   Financial Resource Strain: Not on file  Food Insecurity: Not on file  Transportation Needs: Not on file  Physical Activity: Not on file  Stress: Not on file  Social Connections: Not on file   Additional Social History:    Allergies:  No Known Allergies  Labs:  Results for orders placed or performed during the hospital encounter of 07/15/21 (from the past 48 hour(s))  CBC with Differential     Status: Abnormal   Collection Time:  07/15/21  7:33 AM  Result Value Ref Range   WBC 18.6 (H) 4.0 - 10.5 K/uL   RBC 4.40 4.22 - 5.81 MIL/uL   Hemoglobin 13.3 13.0 - 17.0 g/dL   HCT 62.9 (L) 52.8 - 41.3 %   MCV 80.9 80.0 - 100.0 fL   MCH 30.2 26.0 - 34.0 pg   MCHC 37.4 (H) 30.0 - 36.0 g/dL   RDW 24.4 01.0 - 27.2 %   Platelets 216 150 - 400 K/uL   nRBC 0.0 0.0 - 0.2 %   Neutrophils Relative % 78 %   Neutro Abs 14.3 (H) 1.7 - 7.7 K/uL   Lymphocytes Relative 15 %   Lymphs Abs 2.8 0.7 - 4.0 K/uL   Monocytes Relative 6 %   Monocytes Absolute 1.1 (H) 0.1 - 1.0 K/uL   Eosinophils Relative 0 %   Eosinophils Absolute 0.0 0.0 - 0.5 K/uL   Basophils Relative 0 %   Basophils Absolute 0.0 0.0 - 0.1 K/uL   Immature Granulocytes 1 %   Abs Immature Granulocytes 0.26 (H) 0.00 - 0.07 K/uL    Comment: Performed at [facility], [address] [place], [place] [address]  Comprehensive metabolic panel     Status: Abnormal   Collection Time: 07/15/21  7:33 AM  Result Value Ref Range   Sodium 136 135 - 145 mmol/L   Potassium 3.7 3.5 - 5.1 mmol/L   Chloride 106 98 - 111 mmol/L   CO2 24 22 - 32 mmol/L   Glucose, Bld 90 70 - 99 mg/dL    Comment: Glucose reference range applies only to samples taken after fasting for at least 8 hours.   BUN 15 6 - 20 mg/dL   Creatinine, Ser 4.03 0.61 - 1.24 mg/dL   Calcium 8.7 (L) 8.9 - 10.3 mg/dL   Total Protein 7.5 6.5 - 8.1 g/dL   Albumin 3.7 3.5 - 5.0 g/dL   AST 38 15 - 41 U/L   ALT 31 0 - 44 U/L   Alkaline Phosphatase 39 38 - 126 U/L   Total Bilirubin 1.1 0.3 - 1.2 mg/dL   GFR, Estimated >47 >42 mL/min    Comment: (NOTE) Calculated using the CKD-EPI Creatinine Equation (2021)    Anion gap 6 5 - 15    Comment: Performed at [facility], [address] [place], [place] [address]  Ethanol     Status: None   Collection Time: 07/15/21  7:33 AM  Result Value Ref Range   Alcohol, Ethyl (B) <10 <10 mg/dL    Comment: (NOTE) Lowest detectable limit for serum alcohol is 10  mg/dL.  For medical purposes only. Performed at [facility], [address] [place], [place] [address]   Salicylate level  Status: Abnormal   Collection Time: 07/15/21  7:33 AM  Result Value Ref Range   Salicylate Lvl <7.0 (L) 7.0 - 30.0 mg/dL    Comment: Performed at [facility], [address] [place], [place] [address]  Acetaminophen level     Status: Abnormal   Collection Time: 07/15/21  7:33 AM  Result Value Ref Range   Acetaminophen (Tylenol), Serum <10 (L) 10 - 30 ug/mL    Comment: (NOTE) Therapeutic concentrations vary significantly. A range of 10-30 ug/mL  may be an effective concentration for many patients. However, some  are best treated at concentrations outside of this range. Acetaminophen concentrations >150 ug/mL at 4 hours after ingestion  and >50 ug/mL at 12 hours after ingestion are often associated with  toxic reactions.  Performed at [facility], [address] [place], [place] [address]   Urinalysis, Complete w Microscopic Urine, Clean Catch     Status: Abnormal   Collection Time: 07/15/21  7:34 AM  Result Value Ref Range   Color, Urine YELLOW (A) YELLOW   APPearance CLEAR (A) CLEAR   Specific Gravity, Urine 1.021 1.005 - 1.030   pH 6.0 5.0 - 8.0   Glucose, UA NEGATIVE NEGATIVE mg/dL   Hgb urine dipstick NEGATIVE NEGATIVE   Bilirubin Urine NEGATIVE NEGATIVE   Ketones, ur NEGATIVE NEGATIVE mg/dL   Protein, ur 30 (A) NEGATIVE mg/dL   Nitrite NEGATIVE NEGATIVE   Leukocytes,Ua SMALL (A) NEGATIVE   RBC / HPF 0-5 0 - 5 RBC/hpf   WBC, UA 11-20 0 - 5 WBC/hpf   Bacteria, UA NONE SEEN NONE SEEN   Squamous Epithelial / LPF 0-5 0 - 5   Mucus PRESENT     Comment: Performed at [facility], [address] [place], [place] [address]  Urine Drug Screen, Qualitative     Status: Abnormal   Collection Time: 07/15/21  7:34 AM  Result Value Ref Range   Tricyclic, Ur Screen NONE DETECTED NONE DETECTED   Amphetamines,  Ur Screen NONE DETECTED NONE DETECTED   MDMA (Ecstasy)Ur Screen NONE DETECTED NONE DETECTED   Cocaine Metabolite,Ur [place] POSITIVE (A) NONE DETECTED   Opiate, Ur Screen NONE DETECTED NONE DETECTED   Phencyclidine (PCP) Ur S NONE DETECTED NONE DETECTED   Cannabinoid 50 Ng, Ur [place] POSITIVE (A) NONE DETECTED   Barbiturates, Ur Screen NONE DETECTED NONE DETECTED   Benzodiazepine, Ur Scrn NONE DETECTED NONE DETECTED   Methadone Scn, Ur NONE DETECTED NONE DETECTED    Comment: (NOTE) Tricyclics + metabolites, urine    Cutoff 1000 ng/mL Amphetamines + metabolites, urine  Cutoff 1000 ng/mL MDMA (Ecstasy), urine              Cutoff 500 ng/mL Cocaine Metabolite, urine          Cutoff 300 ng/mL Opiate + metabolites, urine        Cutoff 300 ng/mL Phencyclidine (PCP), urine         Cutoff 25 ng/mL Cannabinoid, urine                 Cutoff 50 ng/mL Barbiturates + metabolites, urine  Cutoff 200 ng/mL Benzodiazepine, urine              Cutoff 200 ng/mL Methadone, urine                   Cutoff 300 ng/mL  The urine drug screen provides only a preliminary, unconfirmed analytical test result and should not be used for  non-medical purposes. Clinical consideration and professional judgment should be applied to any positive drug screen result due to possible interfering substances. A more specific alternate chemical method must be used in order to obtain a confirmed analytical result. Gas chromatography / mass spectrometry (GC/MS) is the preferred confirm atory method. Performed at [facility], [address] [place], [place] [address]   Resp Panel by RT-PCR (Flu A&B, Covid) Nasopharyngeal Swab     Status: None   Collection Time: 07/15/21  7:57 AM   Specimen: Nasopharyngeal Swab; Nasopharyngeal(NP) swabs in vial transport medium  Result Value Ref Range   SARS Coronavirus 2 by RT PCR NEGATIVE NEGATIVE    Comment: (NOTE) SARS-CoV-2 target nucleic acids are NOT DETECTED.  The SARS-CoV-2 RNA is  generally detectable in upper respiratory specimens during the acute phase of infection. The lowest concentration of SARS-CoV-2 viral copies this assay can detect is 138 copies/mL. A negative result does not preclude SARS-Cov-2 infection and should not be used as the sole basis for treatment or other patient management decisions. A negative result may occur with  improper specimen collection/handling, submission of specimen other than nasopharyngeal swab, presence of viral mutation(s) within the areas targeted by this assay, and inadequate number of viral copies(<138 copies/mL). A negative result must be combined with clinical observations, patient history, and epidemiological information. The expected result is Negative.  Fact Sheet for Patients:  BloggerCourse.com  Fact Sheet for Healthcare Providers:  SeriousBroker.it  This test is no t yet approved or cleared by the [place] FDA and  has been authorized for detection and/or diagnosis of SARS-CoV-2 by FDA under an Emergency Use Authorization (EUA). This EUA will remain  in effect (meaning this test can be used) for the duration of the COVID-19 declaration under Section 564(b)(1) of the Act, 21 U.S.C.section 360bbb-3(b)(1), unless the authorization is terminated  or revoked sooner.       Influenza A by PCR NEGATIVE NEGATIVE   Influenza B by PCR NEGATIVE NEGATIVE    Comment: (NOTE) The Xpert Xpress SARS-CoV-2/FLU/RSV plus assay is intended as an aid in the diagnosis of influenza from Nasopharyngeal swab specimens and should not be used as a sole basis for treatment. Nasal washings and aspirates are unacceptable for Xpert Xpress SARS-CoV-2/FLU/RSV testing.  Fact Sheet for Patients: BloggerCourse.com  Fact Sheet for Healthcare Providers: SeriousBroker.it  This test is not yet approved or cleared by the [place] FDA  and has been authorized for detection and/or diagnosis of SARS-CoV-2 by FDA under an Emergency Use Authorization (EUA). This EUA will remain in effect (meaning this test can be used) for the duration of the COVID-19 declaration under Section 564(b)(1) of the Act, 21 U.S.C. section 360bbb-3(b)(1), unless the authorization is terminated or revoked.  Performed at [facility], [address], [place], [place] [address]     Current Facility-Administered Medications  Medication Dose Route Frequency Provider Last Rate Last Admin   ARIPiprazole (ABILIFY) tablet 20 mg  20 mg Oral Daily Louie Flenner, Jackquline Denmark, MD       nicotine (NICODERM CQ - dosed in mg/24 hours) patch 21 mg  21 mg Transdermal Daily Alyshia Kernan, Jackquline Denmark, MD       Current Outpatient Medications  Medication Sig Dispense Refill   ARIPiprazole (ABILIFY) 10 MG tablet Take 1 tablet (10 mg total) by mouth daily. 30 tablet 1   brompheniramine-pseudoephedrine-DM 30-2-10 MG/5ML syrup Take 10 mLs by mouth 4 (four) times daily as needed. 120 mL 0   naproxen (NAPROSYN)  500 MG tablet Take 1 tablet (500 mg total) by mouth 2 (two) times daily with a meal. 30 tablet 0   ondansetron (ZOFRAN ODT) 4 MG disintegrating tablet Take 1 tablet (4 mg total) by mouth every 8 (eight) hours as needed for nausea or vomiting. 20 tablet 0   traZODone (DESYREL) 100 MG tablet Take 1 tablet (100 mg total) by mouth at bedtime. 30 tablet 1    Musculoskeletal: Strength & Muscle Tone: within normal limits Gait & Station: normal Patient leans: N/A            Psychiatric Specialty Exam:  Presentation  General Appearance: Appropriate for Environment; Casual  Eye Contact:Good  Speech:Clear and Coherent; Normal Rate  Speech Volume:Normal  Handedness:Right   Mood and Affect  Mood:Euthymic  Affect:Appropriate; Congruent   Thought Process  Thought Processes:Coherent  Descriptions of Associations:Intact  Orientation:Full (Time, Place and  Person)  Thought Content:WDL  History of Schizophrenia/Schizoaffective disorder:Yes  Duration of Psychotic Symptoms:Greater than six months  Hallucinations:No data recorded Ideas of Reference:None  Suicidal Thoughts:No data recorded Homicidal Thoughts:No data recorded  Sensorium  Memory:Immediate Good; Recent Good; Remote Good  Judgment:Good  Insight:Good   Executive Functions  Concentration:Good  Attention Span:Good  Recall:Good  Fund of Knowledge:Good  Language:Good   Psychomotor Activity  Psychomotor Activity: No data recorded  Assets  Assets:Communication Skills; Desire for Improvement; Financial Resources/Insurance; Housing; Physical Health; Social Support; Transportation   Sleep  Sleep: No data recorded  Physical Exam: Physical Exam Vitals and nursing note reviewed.  Constitutional:      Appearance: Normal appearance.  HENT:     Head: Normocephalic and atraumatic.     Mouth/Throat:     Pharynx: Oropharynx is clear.  Eyes:     Pupils: Pupils are equal, round, and reactive to light.  Cardiovascular:     Rate and Rhythm: Normal rate and regular rhythm.  Pulmonary:     Effort: Pulmonary effort is normal.     Breath sounds: Normal breath sounds.  Abdominal:     General: Abdomen is flat.     Palpations: Abdomen is soft.  Musculoskeletal:        General: Normal range of motion.  Skin:    General: Skin is warm and dry.       Neurological:     General: No focal deficit present.     Mental Status: He is alert. Mental status is at baseline.  Psychiatric:        Attention and Perception: He is inattentive. He perceives auditory hallucinations.        Mood and Affect: Mood is depressed. Affect is blunt.        Speech: Speech is delayed and tangential.        Behavior: Behavior is slowed.        Thought Content: Thought content includes suicidal ideation. Thought content does not include suicidal plan.        Cognition and Memory: Cognition is  impaired.        Judgment: Judgment is inappropriate.   Review of Systems  Constitutional: Negative.   HENT: Negative.    Eyes: Negative.   Respiratory: Negative.    Cardiovascular: Negative.   Gastrointestinal: Negative.   Musculoskeletal: Negative.   Skin: Negative.   Neurological: Negative.   Psychiatric/Behavioral:  Positive for depression, hallucinations, memory loss, substance abuse and suicidal ideas. The patient is nervous/anxious and has insomnia.   Blood pressure 131/74, pulse 74, temperature 98.8 F (37.1 C), temperature source Oral, resp.  rate 18, SpO2 95 %. There is no height or weight on file to calculate BMI.  Treatment Plan Summary: Medication management and Plan patient is appropriate for inpatient hospitalization based on diagnosis prognosis and current dangerousness.  Case reviewed with emergency room physician and TTS and inpatient team.  Labs reviewed.  I have ordered a resumption of his Abilify 20 mg a day also a nicotine patch.  Patient agreeable and understands the plan of admission to the hospital.  Orders will be placed for inpatient hospital stay.  Follow-up consultation can be obtained with ophthalmology or hospitalist as needed.  Disposition: Recommend psychiatric Inpatient admission when medically cleared. Supportive therapy provided about ongoing stressors.  Mordecai Rasmussen, MD 07/15/2021 12:00 PM

## 2021-07-16 DIAGNOSIS — F203 Undifferentiated schizophrenia: Principal | ICD-10-CM

## 2021-07-16 LAB — CBC
HCT: 35.6 % — ABNORMAL LOW (ref 39.0–52.0)
Hemoglobin: 13.3 g/dL (ref 13.0–17.0)
MCH: 31.4 pg (ref 26.0–34.0)
MCHC: 37.4 g/dL — ABNORMAL HIGH (ref 30.0–36.0)
MCV: 84 fL (ref 80.0–100.0)
Platelets: 187 10*3/uL (ref 150–400)
RBC: 4.24 MIL/uL (ref 4.22–5.81)
RDW: 13.6 % (ref 11.5–15.5)
WBC: 7.9 10*3/uL (ref 4.0–10.5)
nRBC: 0 % (ref 0.0–0.2)

## 2021-07-16 LAB — LIPID PANEL
Cholesterol: 178 mg/dL (ref 0–200)
HDL: 68 mg/dL (ref >40)
LDL Cholesterol: 98 mg/dL (ref 0–99)
Total CHOL/HDL Ratio: 2.6 RATIO
Triglycerides: 61 mg/dL (ref <150)
VLDL: 12 mg/dL (ref 0–40)

## 2021-07-16 MED ORDER — CITALOPRAM HYDROBROMIDE 20 MG PO TABS
20.0000 mg | ORAL_TABLET | Freq: Every day | ORAL | Status: DC
  Administered 2021-07-16 – 2021-07-18 (×3): 20 mg via ORAL
  Filled 2021-07-16 (×3): qty 1

## 2021-07-16 MED ORDER — THIAMINE HCL 100 MG PO TABS
100.0000 mg | ORAL_TABLET | Freq: Every day | ORAL | Status: DC
  Administered 2021-07-16 – 2021-07-18 (×3): 100 mg via ORAL
  Filled 2021-07-16 (×3): qty 1

## 2021-07-16 MED ORDER — LORAZEPAM 1 MG PO TABS
1.0000 mg | ORAL_TABLET | Freq: Four times a day (QID) | ORAL | Status: DC | PRN
  Administered 2021-07-16: 1 mg via ORAL
  Filled 2021-07-16: qty 1

## 2021-07-16 NOTE — Progress Notes (Signed)
Awake and alert; pleasant and cooperative. No complaints at this time. Remains safe on the unit with q15 min safety checks.

## 2021-07-16 NOTE — Plan of Care (Signed)
  Problem: Education: Goal: Knowledge of the prescribed therapeutic regimen will improve Outcome: Progressing   

## 2021-07-16 NOTE — Progress Notes (Signed)
Awake, alert, pleasant and cooperative with care. Denies SI/HI; does not endorse any hallucinations at present time; c/o anxiety and headache. Medication administered for headache and anxiety - see MAR. No other complaints at this time. Remains safe on the unit with q15 minute safety checks.

## 2021-07-16 NOTE — H&P (Signed)
Psychiatric Admission Assessment Adult  Patient Identification: Dominic [name] MRN:  [id number] Date of Evaluation:  07/16/2021 Chief Complaint:  Schizophrenia, undifferentiated (HCC) [F20.3] Principal Diagnosis: <principal problem not specified> Diagnosis:  Active Problems:   Schizophrenia, undifferentiated (HCC)  History of Present Illness:  C/C and HPI - "Not doing right, seeing things and hearing voices"  45 year old man with a history of schizophrenia and substance abuse came into the hospital with self-injury and suicidal ideation.   Patient seen chart reviewed. .  Peer psych consult note- Patient well known from prior encounters.  45 year old man with a history of schizophrenia and substance abuse.  Patient states he has not been following up with outpatient mental health treatment and that he has not been on his medication for "a year".  He says he has been using cocaine regularly and also drinking regularly although he was not drinking last night.  Patient says he was at home watching television and tried to cut his left eyeball out with a butcher knife.  Managed to give himself a laceration on the left eyelid.  Endorses auditory hallucinations telling him to do things.  Mood dysphoric anxious and confused.  Denies homicidal ideation.  Patient is currently calm and cooperative. Pt seen in his room, pt guarded and tangential, Pt reports seeing things " spirits and devil" and hearing voices, worsening recently for last several days,  pt vague about voices telling him to hurt him, pt endorses anxiety and depression. Admits using crack cocaine and alcohol 6 packs daily, denies withdrawal symptoms at this time. Pt denies suicidal intent/plan today.  Associated Signs/Symptoms: Total Time spent with patient: 45 min  Past Psychiatric History- schizophrenia or schizoaffective disorder as well as cocaine and alcohol abuse.  Many prior hospital stays.  Does well on long-acting injectables but has  had compliance issues.  Has had some issues of self-injury and suicidal ideation in the past  Is the patient at risk to self? yes Has the patient been a risk to self in the past 6 months? yes  Has the patient been a risk to self within the distant past? yes Is the patient a risk to others? no Has the patient been a risk to others in the past 6 months? No.  Has the patient been a risk to others within the distant past? unknown   Prior Inpatient Therapy:   Prior Outpatient Therapy:    Alcohol Screening: 1. How often do you have a drink containing alcohol?: 4 or more times a week 2. How many drinks containing alcohol do you have on a typical day when you are drinking?: 7, 8, or 9 3. How often do you have six or more drinks on one occasion?: Weekly AUDIT-C Score: 10 4. How often during the last year have you found that you were not able to stop drinking once you had started?: Daily or almost daily 5. How often during the last year have you failed to do what was normally expected from you because of drinking?: Weekly 6. How often during the last year have you needed a first drink in the morning to get yourself going after a heavy drinking session?: Weekly 7. How often during the last year have you had a feeling of guilt of remorse after drinking?: Weekly 8. How often during the last year have you been unable to remember what happened the night before because you had been drinking?: Weekly 9. Have you or someone else been injured as a result of your drinking?:  No 10. Has a relative or friend or a doctor or another health worker been concerned about your drinking or suggested you cut down?: Yes, but not in the last year Alcohol Use Disorder Identification Test Final Score (AUDIT): 28 Alcohol Brief Interventions/Follow-up: Alcohol education/Brief advice Substance Abuse History in the last 12 months:  see above Consequences of Substance Abuse: See above Previous Psychotropic Medications:  yse Psychological Evaluations:  Past Medical History:  Past Medical History:  Diagnosis Date   Anxiety    Asthma    Bipolar 1 disorder (HCC)    Chronic hepatitis C without hepatic coma (HCC) 05/28/2018   Depression    Hepatitis C    Hepatitis C antibody positive in blood 05/28/2018   Schizophrenia (HCC)    Sleep apnea     Past Surgical History:  Procedure Laterality Date   CYST REMOVAL NECK     neck   Family History:  Family History  Problem Relation Age of Onset   Hypertension Sister    Schizophrenia Sister    Family Psychiatric  History- sister- schizophrenia Tobacco Screening:   Social History:  Social History   Substance and Sexual Activity  Alcohol Use Yes   Alcohol/week: 3.0 standard drinks   Types: 3 Shots of liquor per week     Social History   Substance and Sexual Activity  Drug Use Yes   Types: Cocaine, Marijuana   Comment: last used today    Additional Social History: Marital status: Single Are you sexually active?: Yes What is your sexual orientation?: "Male" Has your sexual activity been affected by drugs, alcohol, medication, or emotional stress?: Patient agrees drugs and alcohol have affected his sexual activity Does patient have children?: Yes How many children?: 66 (38 and 72 year old girls) How is patient's relationship with their children?: "It's great"                         Allergies:  No Known Allergies Lab Results:  Results for orders placed or performed during the hospital encounter of 07/15/21 (from the past 48 hour(s))  Lipid panel     Status: None   Collection Time: 07/16/21  6:26 AM  Result Value Ref Range   Cholesterol 178 0 - 200 mg/dL   Triglycerides 61 <450 mg/dL   HDL 68 >38 mg/dL   Total CHOL/HDL Ratio 2.6 RATIO   VLDL 12 0 - 40 mg/dL   LDL Cholesterol 98 0 - 99 mg/dL    Comment:        Total Cholesterol/HDL:CHD Risk Coronary Heart Disease Risk Table                     Men   Women  1/2 Average Risk   3.4    3.3  Average Risk       5.0   4.4  2 X Average Risk   9.6   7.1  3 X Average Risk  23.4   11.0        Use the calculated Patient Ratio above and the CHD Risk Table to determine the patient's CHD Risk.        ATP III CLASSIFICATION (LDL):  <100     mg/dL   Optimal  882-800  mg/dL   Near or Above                    Optimal  130-159  mg/dL   Borderline  349-179  mg/dL   High  >338     mg/dL   Very High Performed at [facility], [address] [place], [place] [address]     Blood Alcohol level:  Lab Results  Component Value Date   [facility] <10 07/15/2021   ETH <10 06/11/2021    Metabolic Disorder Labs:  Lab Results  Component Value Date   HGBA1C 5.3 04/08/2018   MPG 105.41 04/08/2018   No results found for: PROLACTIN Lab Results  Component Value Date   CHOL 178 07/16/2021   TRIG 61 07/16/2021   HDL 68 07/16/2021   CHOLHDL 2.6 07/16/2021   VLDL 12 07/16/2021   LDLCALC 98 07/16/2021   LDLCALC 81 04/08/2018    Current Medications: Current Facility-Administered Medications  Medication Dose Route Frequency Provider Last Rate Last Admin   acetaminophen (TYLENOL) tablet 650 mg  650 mg Oral Q6H PRN Clapacs, Jackquline Denmark, MD   650 mg at 07/16/21 0804   alum & mag hydroxide-simeth (MAALOX/MYLANTA) 200-200-20 MG/5ML suspension 30 mL  30 mL Oral Q4H PRN Clapacs, Jackquline Denmark, MD       ARIPiprazole (ABILIFY) tablet 20 mg  20 mg Oral Daily Clapacs, John T, MD   20 mg at 07/16/21 0804   hydrOXYzine (ATARAX/VISTARIL) tablet 50 mg  50 mg Oral TID PRN Clapacs, Jackquline Denmark, MD       magnesium hydroxide (MILK OF MAGNESIA) suspension 30 mL  30 mL Oral Daily PRN Clapacs, Jackquline Denmark, MD       nicotine (NICODERM CQ - dosed in mg/24 hours) patch 21 mg  21 mg Transdermal Daily Clapacs, Jackquline Denmark, MD       traZODone (DESYREL) tablet 100 mg  100 mg Oral QHS Clapacs, John T, MD   100 mg at 07/15/21 2200   PTA Medications: Medications Prior to Admission  Medication Sig Dispense Refill Last Dose    ARIPiprazole (ABILIFY) 10 MG tablet Take 1 tablet (10 mg total) by mouth daily. (Patient not taking: Reported on 07/15/2021) 30 tablet 1    brompheniramine-pseudoephedrine-DM 30-2-10 MG/5ML syrup Take 10 mLs by mouth 4 (four) times daily as needed. 120 mL 0    naproxen (NAPROSYN) 500 MG tablet Take 1 tablet (500 mg total) by mouth 2 (two) times daily with a meal. 30 tablet 0    ondansetron (ZOFRAN ODT) 4 MG disintegrating tablet Take 1 tablet (4 mg total) by mouth every 8 (eight) hours as needed for nausea or vomiting. (Patient not taking: Reported on 07/15/2021) 20 tablet 0    traZODone (DESYREL) 100 MG tablet Take 1 tablet (100 mg total) by mouth at bedtime. (Patient not taking: Reported on 07/15/2021) 30 tablet 1     Musculoskeletal: Strength & Muscle Tone: within normal limits Gait & Station: normal Patient leans: N/A            Psychiatric Specialty Exam:  Presentation  General Appearance: Appropriate for Environment; Casual  Eye Contact: poor Speech:slow  Speech Volume:Normal  Handedness:Right   Mood and Affect  Mood: anxious Affect: anxious, restricted  Thought Process  Thought Processes:tangential Duration of Psychotic Symptoms: Greater than six months  Past Diagnosis of Schizophrenia or Psychoactive disorder: Yes  Descriptions of Associations: tangential Orientation:Full (Time, Place and Person)  Thought Content: vague Hallucinations: AVH Ideas of Reference:None  Suicidal Thoughts: yes intermittent, no plan Homicidal Thoughts:denies  Sensorium  Memory:Immediate Good; Recent Good; Remote Good  Judgment:poor Insight:poor  Executive Functions  Concentration: limited Attention Span:limited Recall:fair Fund of Knowledge:Good  Language:Good  Psychomotor Activity  Psychomotor Activity: No data recorded  Assets  Assets:Communication Skills; Desire for Improvement; Financial Resources/Insurance; Housing; Physical Health; Social Support;  Transportation   Sleep  Sleep: No data recorded   Physical Exam: Physical Exam Cardiovascular:     Rate and Rhythm: Normal rate.  Pulmonary:     Effort: Pulmonary effort is normal.  Musculoskeletal:        General: Normal range of motion.  Neurological:     General: No focal deficit present.     Mental Status: He is alert and oriented to person, place, and time.   ROS Blood pressure 119/83, pulse 66, temperature (!) 97.5 F (36.4 C), temperature source Oral, resp. rate 18, height 6\' 1"  (1.854 m), weight 102 kg, SpO2 100 %. Body mass index is 29.67 kg/m.  Treatment Plan Summary: Daily contact with patient to assess and evaluate symptoms and progress in treatment and Medication management. Pt with schizophrenia and substance abuse., worsening psychosis, suicidal attempt, poor med compliance.  Abilify for psychosis/ schizophrenia. Try Celexa for depression/anxiety. pt states he tried Zoloft in the past that did not help. Trazodone for sleep Ativan for alcohol withdrawal, vistaril for anxiety  Thiamine for supplement Nicotine patch for nicotine dependence  Will manage medical issues as appropriate Individual , Group/Milieu tx as tolerated.  Relevant labs   Observation Level/Precautions:  q 15 min   Laboratory:  see above  Psychotherapy:    Medications:    Consultations:    Discharge Concerns:    Estimated LOS:  Other:     Physician Treatment Plan for Primary Diagnosis: <principal problem not specified> Long Term Goal(s): Improvement in symptoms so as ready for discharge  Short Term Goals: Ability to identify changes in lifestyle to reduce recurrence of condition will improve, Ability to verbalize feelings will improve, Ability to disclose and discuss suicidal ideas, Ability to demonstrate self-control will improve, Ability to identify and develop effective coping behaviors will improve, Ability to maintain clinical measurements within normal limits will improve,  Compliance with prescribed medications will improve, and Ability to identify triggers associated with substance abuse/mental health issues will improve  Physician Treatment Plan for Secondary Diagnosis: Active Problems:   Schizophrenia, undifferentiated (HCC)  Long Term Goal(s): Improvement in symptoms so as ready for discharge  Short Term Goals: Ability to identify changes in lifestyle to reduce recurrence of condition will improve, Ability to verbalize feelings will improve, Ability to disclose and discuss suicidal ideas, Ability to demonstrate self-control will improve, Ability to identify and develop effective coping behaviors will improve, Ability to maintain clinical measurements within normal limits will improve, Compliance with prescribed medications will improve, and Ability to identify triggers associated with substance abuse/mental health issues will improve  I certify that inpatient services furnished can reasonably be expected to improve the patient's condition.    , MD 9/3/202211:04 AM

## 2021-07-16 NOTE — Group Note (Signed)
LCSW Group Therapy Note  Group Date: 07/16/2021 Start Time: 1305 End Time: 1405   Type of Therapy and Topic:  Group Therapy - Healthy vs Unhealthy Coping Skills  Participation Level:  Did Not Attend   Description of Group The focus of this group was to determine what unhealthy coping techniques typically are used by group members and what healthy coping techniques would be helpful in coping with various problems. Patients were guided in becoming aware of the differences between healthy and unhealthy coping techniques. Patients were asked to identify 2-3 healthy coping skills they would like to learn to use more effectively.  Therapeutic Goals Patients learned that coping is what human beings do all day long to deal with various situations in their lives Patients defined and discussed healthy vs unhealthy coping techniques Patients identified their preferred coping techniques and identified whether these were healthy or unhealthy Patients determined 2-3 healthy coping skills they would like to become more familiar with and use more often. Patients provided support and ideas to each other   Summary of Patient Progress: Patient did not attend group despite encouraged participation.    Therapeutic Modalities Cognitive Behavioral Therapy Motivational Interviewing  Norberto Sorenson, Theresia Majors 07/16/2021  4:57 PM

## 2021-07-16 NOTE — Plan of Care (Signed)
Pt rates depression and anxiety both 5/10. Pt denies SI, HI and VH. Pt has AH. Pt was educated on care plan and verbalizes understanding. Torrie Mayers RN Problem: Activity: Goal: Will verbalize the importance of balancing activity with adequate rest periods Outcome: Progressing   Problem: Education: Goal: Will be free of psychotic symptoms Outcome: Progressing Goal: Knowledge of the prescribed therapeutic regimen will improve Outcome: Progressing   Problem: Coping: Goal: Coping ability will improve Outcome: Progressing   Problem: Education: Goal: Knowledge of [employer] [facility] Education information/materials will improve Outcome: Progressing Goal: Emotional status will improve Outcome: Progressing Goal: Mental status will improve Outcome: Progressing Goal: Verbalization of understanding the information provided will improve Outcome: Progressing   Problem: Coping: Goal: Ability to verbalize frustrations and anger appropriately will improve Outcome: Progressing Goal: Ability to demonstrate self-control will improve Outcome: Progressing   Problem: Health Behavior/Discharge Planning: Goal: Identification of resources available to assist in meeting health care needs will improve Outcome: Progressing Goal: Compliance with treatment plan for underlying cause of condition will improve Outcome: Progressing

## 2021-07-16 NOTE — [facility] Suicide Risk Assessment (Signed)
[facility] Admission Suicide Risk Assessment   Nursing information obtained from:  Patient Demographic factors:  Low socioeconomic status, Unemployed Current Mental Status:  Suicidal ideation indicated by patient Loss Factors:  Financial problems / change in socioeconomic status Historical Factors:  Prior suicide attempts Risk Reduction Factors:  Living with another person, especially a relative  Total Time spent with patient: 50 min Principal Problem: <principal problem not specified> Diagnosis:  Active Problems:   Schizophrenia, undifferentiated (HCC)  Subjective Data: C/C and HPI - "Not doing right, seeing things and hearing voices"  45 year old [name] with a history of schizophrenia and substance abuse came into the hospital with self-injury and suicidal ideation.   Patient seen chart reviewed. .  Peer psych consult note- Patient well known from prior encounters.  Dominic [name] with a history of schizophrenia and substance abuse.  Patient states he has not been following up with outpatient mental health treatment and that he has not been on his medication for "a year".  He says he has been using cocaine regularly and also drinking regularly although he was not drinking last night.  Patient says he was at home watching television and tried to cut his left eyeball out with a butcher knife.  Managed to give himself a laceration on the left eyelid.  Endorses auditory hallucinations telling him to do things.  Mood dysphoric anxious and confused.  Denies homicidal ideation.  Patient is currently calm and cooperative. Pt seen in his room, pt guarded and tangential, Pt reports seeing things " spirits and devil" and hearing voices, worsening recently for last several days,  pt vague about voices telling him to hurt him, pt endorses anxiety and depression. Admits using crack cocaine and alcohol 6 packs daily, denies withdrawal symptoms at this time. Pt denies suicidal intent/plan today.  Past Psychiatric  History- schizophrenia or schizoaffective disorder as well as cocaine and alcohol abuse.  Many prior hospital stays.  Does well on long-acting injectables but has had compliance issues.  Has had some issues of self-injury and suicidal ideation in the past   Continued Clinical Symptoms:  Alcohol Use Disorder Identification Test Final Score (AUDIT): 28 The "Alcohol Use Disorders Identification Test", Guidelines for Use in Primary Care, Second Edition.  World Science writer [facility]). Score between 0-7:  no or low risk or alcohol related problems. Score between 8-15:  moderate risk of alcohol related problems. Score between 16-19:  high risk of alcohol related problems. Score 20 or above:  warrants further diagnostic evaluation for alcohol dependence and treatment.   CLINICAL FACTORS:   Alcohol/Substance Abuse/Dependencies Schizophrenia:   Command hallucinatons Depressive state Paranoid or undifferentiated type More than one psychiatric diagnosis Currently Psychotic Previous Psychiatric Diagnoses and Treatments   Musculoskeletal: Strength & Muscle Tone: within normal limits Gait & Station: normal Patient leans: N/A  Psychiatric Specialty Exam:   Presentation  General Appearance: Appropriate for Environment; Casual   Eye Contact: poor Speech:slow   Speech Volume:Normal   Handedness:Right     Mood and Affect  Mood: anxious Affect: anxious, restricted   Thought Process  Thought Processes:tangential Duration of Psychotic Symptoms: Greater than six months   Past Diagnosis of Schizophrenia or Psychoactive disorder: Yes   Descriptions of Associations: tangential Orientation:Full (Time, Place and Person)   Thought Content: vague Hallucinations: AVH Ideas of Reference:None   Suicidal Thoughts: yes intermittent, no plan Homicidal Thoughts:denies   Sensorium  Memory:Immediate Good; Recent Good; Remote Good   Judgment:poor Insight:poor   Executive Functions   Concentration: limited Attention  Span:limited Recall:fair Fund of Knowledge:Good   Language:Good      Psychomotor Activity: slow  Assets  Assets:Communication Skills; Desire for Improvement; Financial Resources/Insurance; Housing; Physical Health; Social Support; Transportation   Sleep  Sleep: No data recorded   Physical Exam: Physical Exam HENT:     Head: Normocephalic.  Cardiovascular:     Rate and Rhythm: Normal rate.  Pulmonary:     Effort: Pulmonary effort is normal.  Musculoskeletal:        General: Normal range of motion.  Neurological:     General: No focal deficit present.     Mental Status: He is alert.   ROS Blood pressure 119/83, pulse 66, temperature (!) 97.5 F (36.4 C), temperature source Oral, resp. rate 18, height 6\' 1"  (1.854 m), weight 102 kg, SpO2 100 %. Body mass index is 29.67 kg/m.   COGNITIVE FEATURES THAT CONTRIBUTE TO RISK:  Loss of executive function  , loss of touch with reality  SUICIDE RISK:   Severe:  Frequent, intense, and enduring suicidal ideation, specific plan, no subjective intent, but some objective markers of intent (i.e., choice of lethal method), the method is accessible, some limited preparatory behavior, evidence of impaired self-control, severe dysphoria/symptomatology, multiple risk factors present, and few if any protective factors, particularly a lack of social support.  PLAN OF CARE: Daily contact with patient to assess and evaluate symptoms and progress in treatment and Medication management. Pt with schizophrenia and substance abuse., worsening psychosis, suicidal attempt, poor med compliance.   Monitor suicide thoughts Abilify for psychosis/ schizophrenia. Try Celexa for depression/anxiety. pt states he tried Zoloft in the past that did not help. Trazodone for sleep Ativan for alcohol withdrawal, vistaril for anxiety  Thiamine for supplement Nicotine patch for nicotine dependence  Will manage medical issues  as appropriate Individual , Group/Milieu tx as tolerated.  Relevant labs  IVC status   I certify that inpatient services furnished can reasonably be expected to improve the patient's condition.   , MD 07/16/2021, 11:41 AM

## 2021-07-16 NOTE — Progress Notes (Signed)
Pt has been calm, cooperative and med compliant. Pt has been pleasant and social. Torrie Mayers RN

## 2021-07-17 DIAGNOSIS — F203 Undifferentiated schizophrenia: Secondary | ICD-10-CM | POA: Diagnosis not present

## 2021-07-17 LAB — TSH: TSH: 1.198 u[IU]/mL (ref 0.350–4.500)

## 2021-07-17 MED ORDER — NAPROXEN 500 MG PO TABS
250.0000 mg | ORAL_TABLET | Freq: Two times a day (BID) | ORAL | Status: DC | PRN
  Administered 2021-07-17: 250 mg via ORAL
  Filled 2021-07-17 (×3): qty 1

## 2021-07-17 MED ORDER — OLANZAPINE 10 MG PO TABS
10.0000 mg | ORAL_TABLET | Freq: Four times a day (QID) | ORAL | Status: DC | PRN
  Administered 2021-07-17: 10 mg via ORAL
  Filled 2021-07-17: qty 1

## 2021-07-17 NOTE — Progress Notes (Signed)
Patient extremely agitated and yelling "Let me out, I need to get out of here." Patient triggered by phone call with bail bondsman. Patient gave RN permission to speak with bail bondsman about inpatient status. RN spoke with bail bondsman about situation and bail bondsmen informed RN to tell patient to follow up with him after discharge and that he would work out something with patient. Patient informed of information received and redirected to use coping skills. Cont to monitor for safety.
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
## 2021-07-17 NOTE — [facility] Suicide Risk Assessment (Signed)
[facility] INPATIENT:  Family/Significant Other Suicide Prevention Education  Suicide Prevention Education:  Education Completed; Jerzy Roepke [phone] has been identified by the patient as the family member/significant other with whom the patient will be residing, and identified as the person(s) who will aid the patient in the event of a mental health crisis (suicidal ideations/suicide attempt).  With written consent from the patient, the family member/significant other has been provided the following suicide prevention education, prior to the and/or following the discharge of the patient.  The suicide prevention education provided includes the following: Suicide risk factors Suicide prevention and interventions National Suicide Hotline telephone number [facility] assessment telephone number [facility] Emergency Assistance 911 [facility] and/or Residential Mobile Crisis Unit telephone number  Request made of family/significant other to: Remove weapons (e.g., guns, rifles, knives), all items previously/currently identified as safety concern.   Remove drugs/medications (over-the-counter, prescriptions, illicit drugs), all items previously/currently identified as a safety concern.  The family member/significant other verbalizes understanding of the suicide prevention education information provided.  The family member/significant other agrees to remove the items of safety concern listed above.  Faucett placed return call to this CSW, calling from phone number, [phone] and identified self as patient's mother. Dible states there are no firearms or drugs/medications in the home identified as a safety concern.   Gildersleeve shared that she believes patient needs to be in an inpatient setting for at least 60 days. Haugen confirmed patient has not been compliant with psychiatric medication and engaging in alcohol and substance use. Montalto states she is afraid he will hurt  himself or someone will hurt him. Salinger states patient goes out walking in the community around 2-3 in the morning and she is afraid for patient's safety.  This CSW encouraged Moffatt to utilize crisis resources in the event that patient presents as a threat to himself or others. No additional concerns noted.   Ileana Ladd Darrelyn Morro 07/17/2021, 10:07 AM
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
## 2021-07-17 NOTE — Group Note (Addendum)
[facility] LCSW Group Therapy Note   Group Date: 07/17/2021 Start Time: 1300 End Time: 1400   Type of Therapy and Topic: Group Therapy: Avoiding Self-Sabotaging and Enabling Behaviors  Participation Level: Active  Mood: Anxious  Description of Group:  In this group, patients will learn how to identify obstacles, self-sabotaging and enabling behaviors, as well as: what are they, why do we do them and what needs these behaviors meet. Discuss unhealthy relationships and how to have positive healthy boundaries with those that sabotage and enable. Explore aspects of self-sabotage and enabling in yourself and how to limit these self-destructive behaviors in everyday life.   Therapeutic Goals: 1. Patient will identify one obstacle that relates to self-sabotage and enabling behaviors 2. Patient will identify one personal self-sabotaging or enabling behavior they did prior to admission 3. Patient will state a plan to change the above identified behavior 4. Patient will demonstrate ability to communicate their needs through discussion and/or role play.    Summary of Patient Progress: Patient presented late to group, leaving group intermittently and before the end of meeting. Patient presented alert and cooperative, stating that he wants to get substance abuse treatment. Patient was receptive to group discussion and feedback. Patient stated that the saying, "one is too many, a million is never enough" is a way that he self-sabotages when engaging with substances. Patient identified who he puts himself around and places as additional self sabotaging behavior. Patient identified his daughters as motivation to participate in treatment.    Therapeutic Modalities:  Cognitive Behavioral Therapy Person-Centered Therapy Motivational Interviewing    Norberto Sorenson, LCSW 07/17/2021 4:26PM
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
## 2021-07-17 NOTE — Progress Notes (Signed)
[facility] MD Progress Note  07/17/2021 11:48 AM Dominic [name]  MRN:  [id number] Subjective:   My legs are hurting, tylenol does not help" Interval hx-  45 year old man with a history of schizophrenia and substance abuse came into the hospital with self-injury and suicidal ideation  Chart reviewed. Per Nursing- Awake, alert, pleasant and cooperative with care. Denies SI/HI; does not endorse any hallucinations at present time; c/o anxiety and headache. Medication administered for headache and anxiety - see MAR. No other complaints at this time. Remains safe on the unit with q15 minute safety checks. Pt lying in bed, pt reports pain as above, agrees to try naproxyn. Pt continue to report AVH, denies suicidal thought/intent/plan. Pt  isolative and guarded.  Principal Problem: <principal problem not specified> Diagnosis: Active Problems:   Schizophrenia, undifferentiated (HCC) Patient seen chart reviewed Total Time spent with patient: 30 min  Past Psychiatric History: no new info  Past Medical History:  Past Medical History:  Diagnosis Date   Anxiety    Asthma    Bipolar 1 disorder (HCC)    Chronic hepatitis C without hepatic coma (HCC) 05/28/2018   Depression    Hepatitis C    Hepatitis C antibody positive in blood 05/28/2018   Schizophrenia (HCC)    Sleep apnea     Past Surgical History:  Procedure Laterality Date   CYST REMOVAL NECK     neck   Family History:  Family History  Problem Relation Age of Onset   Hypertension Sister    Schizophrenia Sister    Family Psychiatric  History: no new info Social History:  Social History   Substance and Sexual Activity  Alcohol Use Yes   Alcohol/week: 3.0 standard drinks   Types: 3 Shots of liquor per week     Social History   Substance and Sexual Activity  Drug Use Yes   Types: Cocaine, Marijuana   Comment: last used today    Social History   Socioeconomic History   Marital status: Single    Spouse name: Not on file    Number of children: Not on file   Years of education: Not on file   Highest education level: Not on file  Occupational History   Not on file  Tobacco Use   Smoking status: Some Days    Packs/day: 1.00    Types: Cigarettes   Smokeless tobacco: Never  Vaping Use   Vaping Use: Never used  Substance and Sexual Activity   Alcohol use: Yes    Alcohol/week: 3.0 standard drinks    Types: 3 Shots of liquor per week   Drug use: Yes    Types: Cocaine, Marijuana    Comment: last used today   Sexual activity: Not Currently  Other Topics Concern   Not on file  Social History Narrative   Not on file   Social Determinants of Health   Financial Resource Strain: Not on file  Food Insecurity: Not on file  Transportation Needs: Not on file  Physical Activity: Not on file  Stress: Not on file  Social Connections: Not on file   Additional Social History:                         Sleep: well   Appetite:  fair  Current Medications: Current Facility-Administered Medications  Medication Dose Route Frequency Provider Last Rate Last Admin   acetaminophen (TYLENOL) tablet 650 mg  650 mg Oral Q6H PRN Clapacs, Jackquline Denmark, MD  650 mg at 07/16/21 2000   alum & mag hydroxide-simeth (MAALOX/MYLANTA) 200-200-20 MG/5ML suspension 30 mL  30 mL Oral Q4H PRN Clapacs, Jackquline Denmark, MD       ARIPiprazole (ABILIFY) tablet 20 mg  20 mg Oral Daily Clapacs, Jackquline Denmark, MD   20 mg at 07/17/21 0757   citalopram (CELEXA) tablet 20 mg  20 mg Oral Daily Beverly Sessions, MD   20 mg at 07/17/21 0757   hydrOXYzine (ATARAX/VISTARIL) tablet 50 mg  50 mg Oral TID PRN Clapacs, Jackquline Denmark, MD   50 mg at 07/16/21 2000   LORazepam (ATIVAN) tablet 1 mg  1 mg Oral Q6H PRN Beverly Sessions, MD   1 mg at 07/16/21 1233   magnesium hydroxide (MILK OF MAGNESIA) suspension 30 mL  30 mL Oral Daily PRN Clapacs, Jackquline Denmark, MD       naproxen (NAPROSYN) tablet 250 mg  250 mg Oral BID PRN Beverly Sessions, MD       nicotine (NICODERM CQ - dosed  in mg/24 hours) patch 21 mg  21 mg Transdermal Daily Clapacs, Jackquline Denmark, MD       thiamine tablet 100 mg  100 mg Oral Daily Beverly Sessions, MD   100 mg at 07/17/21 0757   traZODone (DESYREL) tablet 100 mg  100 mg Oral QHS Clapacs, Jackquline Denmark, MD   100 mg at 07/16/21 2000    Lab Results:  Results for orders placed or performed during the hospital encounter of 07/15/21 (from the past 48 hour(s))  Lipid panel     Status: None   Collection Time: 07/16/21  6:26 AM  Result Value Ref Range   Cholesterol 178 0 - 200 mg/dL   Triglycerides 61 <295 mg/dL   HDL 68 >28 mg/dL   Total CHOL/HDL Ratio 2.6 RATIO   VLDL 12 0 - 40 mg/dL   LDL Cholesterol 98 0 - 99 mg/dL    Comment:        Total Cholesterol/HDL:CHD Risk Coronary Heart Disease Risk Table                     Men   Women  1/2 Average Risk   3.4   3.3  Average Risk       5.0   4.4  2 X Average Risk   9.6   7.1  3 X Average Risk  23.4   11.0        Use the calculated Patient Ratio above and the CHD Risk Table to determine the patient's CHD Risk.        ATP III CLASSIFICATION (LDL):  <100     mg/dL   Optimal  413-244  mg/dL   Near or Above                    Optimal  130-159  mg/dL   Borderline  010-272  mg/dL   High  >536     mg/dL   Very High Performed at [facility], [address] [place], [place] [address]   CBC     Status: Abnormal   Collection Time: 07/16/21 12:32 PM  Result Value Ref Range   WBC 7.9 4.0 - 10.5 K/uL   RBC 4.24 4.22 - 5.81 MIL/uL   Hemoglobin 13.3 13.0 - 17.0 g/dL   HCT 47.4 (L) 25.9 - 56.3 %   MCV 84.0 80.0 - 100.0 fL   MCH 31.4 26.0 - 34.0 pg   MCHC 37.4 (H)  30.0 - 36.0 g/dL   RDW 62.3 76.2 - 83.1 %   Platelets 187 150 - 400 K/uL   nRBC 0.0 0.0 - 0.2 %    Comment: Performed at [facility], [address] [place], [place] [address]  TSH     Status: None   Collection Time: 07/17/21  6:26 AM  Result Value Ref Range   TSH 1.198 0.350 - 4.500 uIU/mL    Comment: Performed by a 3rd  Generation assay with a functional sensitivity of <=0.01 uIU/mL. Performed at [facility], [address] [place], [place] [address]     Blood Alcohol level:  Lab Results  Component Value Date   [facility] <10 07/15/2021   ETH <10 06/11/2021    Metabolic Disorder Labs: Lab Results  Component Value Date   HGBA1C 5.3 04/08/2018   MPG 105.41 04/08/2018   No results found for: PROLACTIN Lab Results  Component Value Date   CHOL 178 07/16/2021   TRIG 61 07/16/2021   HDL 68 07/16/2021   CHOLHDL 2.6 07/16/2021   VLDL 12 07/16/2021   LDLCALC 98 07/16/2021   LDLCALC 81 04/08/2018    Physical Findings: AIMS:  , ,  ,  ,    CIWA:  CIWA-Ar Total: 0 COWS:     Musculoskeletal: Strength & Muscle Tone: within normal limits Gait & Station: normal Patient leans: N/A  Psychiatric Specialty Exam:  Presentation  General Appearance: Appropriate for Environment; Casual  Eye Contact:Good  Speech: normal Speech Volume:Normal  Handedness:Right   Mood and Affect  Mood:anxious Affect:Appropriate; Congruent restricted  Thought Process  Thought Processes:tangential Descriptions of Associations:Intact  Orientation:Full (Time, Place and Person)  Thought Content:WDL  History of Schizophrenia/Schizoaffective disorder:Yes  Duration of Psychotic Symptoms:Greater than six months  Hallucinations:AVH Ideas of Reference:None  Suicidal Thoughts:denies Homicidal Thoughts:denies  Sensorium  Memory:Immediate Good; Recent Good; Remote Good  Judgment:limited Insight: limited  Executive Functions  Concentration:Good  Attention Span:Good  Recall:Good  Fund of Knowledge:Good  Language:Good   Psychomotor Activity  Psychomotor Activity: No data recorded  Assets  Assets:Communication Skills; Desire for Improvement; Financial Resources/Insurance; Housing; Physical Health; Social Support; Transportation   Sleep  Sleep: No data recorded   Physical  Exam: Physical Exam Vitals and nursing note reviewed.  HENT:     Head: Normocephalic.  Cardiovascular:     Rate and Rhythm: Normal rate.  Pulmonary:     Effort: Pulmonary effort is normal.  Musculoskeletal:     Cervical back: Normal range of motion.  Neurological:     Mental Status: He is alert and oriented to person, place, and time.   ROS Blood pressure 120/78, pulse 69, temperature 98 F (36.7 C), temperature source Oral, resp. rate 17, height 6\' 1"  (1.854 m), weight 102 kg, SpO2 94 %. Body mass index is 29.67 kg/m.   Treatment Plan Summary: Daily contact with patient to assess and evaluate symptoms and progress in treatment and Medication management. Pt with schizophrenia and substance abuse., worsening psychosis, suicidal attempt, poor med compliance.  Abilify for psychosis/ schizophrenia.  Celexa for depression/anxiety. pt states he tried Zoloft in the past that did not help. Trazodone for sleep Ativan for alcohol withdrawal, vistaril for anxiety  Thiamine for supplement Nicotine patch for nicotine dependence  Will manage medical issues as appropriate Individual , Group/Milieu tx as tolerated.  Relevant labs - TSH-wnl.   , MD 07/17/2021, 11:48 AM

## 2021-07-17 NOTE — Progress Notes (Signed)
Patient made multiple phone calls to deal with his lost card.Vistaril given for anxiety.Patient appears irritable came to staff & states " I need to get out from here right now. I don't need any medicine now." After some prompting patient took Zyprexa PRN. Patient denies SI,HI and AVH at this time.Appetite and energy level good. Attended groups.Support and encouragement given.

## 2021-07-17 NOTE — Progress Notes (Signed)
Awake and alert. Pleasant at this time. Denies SI/HI; denies hallucinations. Reports anxiety and requests trazodone for sleep. Remains safe on the unit with q15 minute safety checks.

## 2021-07-17 NOTE — [facility] Suicide Risk Assessment (Addendum)
[facility] INPATIENT:  Family/Significant Other Suicide Prevention Education  Suicide Prevention Education:  Contact Attempts: Dominic [name]/Mother [phone], (name of family member/significant other) has been identified by the patient as the family member/significant other with whom the patient will be residing, and identified as the person(s) who will aid the patient in the event of a mental health crisis.  With written consent from the patient, two attempts were made to provide suicide prevention education, prior to and/or following the patient's discharge.  We were unsuccessful in providing suicide prevention education.  A suicide education pamphlet was given to the patient to share with family/significant other.  Date and time of first attempt: 07/17/21 9:48AM Date and time of second attempt:  This CSW placed call to [facility]; however, no one answered. Unable to leave voicemail. A second attempt will need to be made at a later time.   Return call was made from contact number provided by patient. Caller identified herself as patient's sister. CSW stated she could not confirm or deny that this social worker was calling in regards to patient and requested a return call from [place].  Dominic [name] 07/17/2021, 9:47 AM

## 2021-07-18 ENCOUNTER — Encounter: Payer: Self-pay | Admitting: Psychiatry

## 2021-07-18 DIAGNOSIS — F203 Undifferentiated schizophrenia: Secondary | ICD-10-CM | POA: Diagnosis not present

## 2021-07-18 MED ORDER — ARIPIPRAZOLE 20 MG PO TABS: 20.0000 mg | ORAL_TABLET | Freq: Every day | ORAL | 1 refills | Status: DC

## 2021-07-18 MED ORDER — ARIPIPRAZOLE ER 400 MG IM SRER
400.0000 mg | INTRAMUSCULAR | Status: DC
  Administered 2021-07-18: 400 mg via INTRAMUSCULAR
  Filled 2021-07-18 (×2): qty 2

## 2021-07-18 MED ORDER — ARIPIPRAZOLE ER 400 MG IM SRER: 400.0000 mg | INTRAMUSCULAR | 3 refills | Status: DC

## 2021-07-18 MED ORDER — THIAMINE HCL 100 MG PO TABS: 100.0000 mg | ORAL_TABLET | Freq: Every day | ORAL | 1 refills | Status: DC

## 2021-07-18 MED ORDER — TRAZODONE HCL 100 MG PO TABS
100.0000 mg | ORAL_TABLET | Freq: Every day | ORAL | 1 refills | Status: DC
Start: 2021-07-18 — End: 2021-07-27

## 2021-07-18 MED ORDER — CITALOPRAM HYDROBROMIDE 20 MG PO TABS: 20.0000 mg | ORAL_TABLET | Freq: Every day | ORAL | 1 refills | Status: DC

## 2021-07-18 NOTE — Progress Notes (Signed)
D: Pt alert and oriented. Pt denies experiencing any pain, SI/HI, or AVH at this time. Pt reports he will be able to keep himself safe when they return home.  A: Pt received discharge and medication education/information. Pt belongings were returned and signed for at this time.   R: Pt verbalized understanding of discharge and medication education/information.  Pt escorted by staff to physician's on call parking where [facility] picked pt up for transport.

## 2021-07-18 NOTE — [facility] Suicide Risk Assessment (Signed)
[facility] Discharge Suicide Risk Assessment   Principal Problem: <principal problem not specified> Discharge Diagnoses: Active Problems:   Cocaine abuse (HCC)   Schizophrenia, undifferentiated (HCC)   Total Time spent with patient: 35 minutes- 25 minutes face-to-face contact with patient, 10 minutes documentation, coordination of care, scripts   Musculoskeletal: Strength & Muscle Tone: within normal limits Gait & Station: normal Patient leans: N/A  Psychiatric Specialty Exam  Presentation  General Appearance: Appropriate for Environment; Casual  Eye Contact:Good  Speech:Clear and Coherent; Normal Rate  Speech Volume:Normal  Handedness:Right   Mood and Affect  Mood:Euthymic  Duration of Depression Symptoms: Greater than two weeks  Affect:Congruent   Thought Process  Thought Processes:Coherent; Goal Directed  Descriptions of Associations:Intact  Orientation:Full (Time, Place and Person)  Thought Content:Logical  History of Schizophrenia/Schizoaffective disorder:Yes  Duration of Psychotic Symptoms:Greater than six months  Hallucinations:Hallucinations: None  Ideas of Reference:None  Suicidal Thoughts:Suicidal Thoughts: No  Homicidal Thoughts:Homicidal Thoughts: No   Sensorium  Memory:Immediate Good; Recent Good; Remote Good  Judgment:Intact  Insight:Good   Executive Functions  Concentration:Good  Attention Span:Good  Recall:Good  Fund of Knowledge:Good  Language:Good   Psychomotor Activity  Psychomotor Activity:Psychomotor Activity: Normal   Assets  Assets:Communication Skills; Desire for Improvement; Financial Resources/Insurance; Housing; Physical Health; Resilience; Social Support   Sleep  Sleep:Sleep: Good Number of Hours of Sleep: 8.4   Physical Exam: Physical Exam ROS Blood pressure 120/78, pulse 69, temperature 98 F (36.7 C), temperature source Oral, resp. rate 17, height 6\' 1"  (1.854 m), weight 102 kg, SpO2 94 %. Body  mass index is 29.67 kg/m.  Mental Status Per Nursing Assessment::   On Admission:  Suicidal ideation indicated by patient  Demographic Factors:  Male  Loss Factors: NA  Historical Factors: Prior suicide attempts and Impulsivity  Risk Reduction Factors:   Sense of responsibility to family, Religious beliefs about death, Living with another person, especially a relative, Positive social support, Positive therapeutic relationship, and Positive coping skills or problem solving skills  Continued Clinical Symptoms:  Bipolar Disorder:   Depressive phase Alcohol/Substance Abuse/Dependencies Previous Psychiatric Diagnoses and Treatments  Cognitive Features That Contribute To Risk:  None    Suicide Risk:  Minimal: No identifiable suicidal ideation.  Patients presenting with no risk factors but with morbid ruminations; may be classified as minimal risk based on the severity of the depressive symptoms    Plan Of Care/Follow-up recommendations:  Activity:  as tolerated Diet:  regular diet  [phone], MD 07/18/2021, 9:35 AM

## 2021-07-18 NOTE — BH IP Treatment Plan (Signed)
Interdisciplinary Treatment and Diagnostic Plan Update  07/18/2021 Time of Session: 9:00AM Laken Lobato MRN: [id number]  Principal Diagnosis: Schizophrenia, undifferentiated (HCC)  Secondary Diagnoses: Principal Problem:   Schizophrenia, undifferentiated (HCC) Active Problems:   Cocaine abuse (HCC)   Current Medications:  Current Facility-Administered Medications  Medication Dose Route Frequency Provider Last Rate Last Admin   acetaminophen (TYLENOL) tablet 650 mg  650 mg Oral Q6H PRN Clapacs, John T, MD   650 mg at 07/16/21 2000   alum & mag hydroxide-simeth (MAALOX/MYLANTA) 200-200-20 MG/5ML suspension 30 mL  30 mL Oral Q4H PRN Clapacs, John T, MD       ARIPiprazole (ABILIFY) tablet 20 mg  20 mg Oral Daily Clapacs, John T, MD   20 mg at 07/18/21 0848   ARIPiprazole ER (ABILIFY MAINTENA) injection 400 mg  400 mg Intramuscular Q28 days Jesse Sans, MD   400 mg at 07/18/21 1209   citalopram (CELEXA) tablet 20 mg  20 mg Oral Daily Beverly Sessions, MD   20 mg at 07/18/21 0848   hydrOXYzine (ATARAX/VISTARIL) tablet 50 mg  50 mg Oral TID PRN Clapacs, John T, MD   50 mg at 07/17/21 1346   LORazepam (ATIVAN) tablet 1 mg  1 mg Oral Q6H PRN Beverly Sessions, MD   1 mg at 07/16/21 1233   magnesium hydroxide (MILK OF MAGNESIA) suspension 30 mL  30 mL Oral Daily PRN Clapacs, Jackquline Denmark, MD       naproxen (NAPROSYN) tablet 250 mg  250 mg Oral BID PRN Beverly Sessions, MD   250 mg at 07/17/21 1524   nicotine (NICODERM CQ - dosed in mg/24 hours) patch 21 mg  21 mg Transdermal Daily Clapacs, John T, MD       OLANZapine (ZYPREXA) tablet 10 mg  10 mg Oral Q6H PRN Beverly Sessions, MD   10 mg at 07/17/21 1524   thiamine tablet 100 mg  100 mg Oral Daily Beverly Sessions, MD   100 mg at 07/18/21 0848   traZODone (DESYREL) tablet 100 mg  100 mg Oral QHS Clapacs, John T, MD   100 mg at 07/17/21 1941   Current Outpatient Medications  Medication Sig Dispense Refill   [START ON 07/19/2021]  ARIPiprazole (ABILIFY) 20 MG tablet Take 1 tablet (20 mg total) by mouth daily. 30 tablet 1   [START ON 08/15/2021] ARIPiprazole ER (ABILIFY MAINTENA) 400 MG SRER injection Inject 2 mLs (400 mg total) into the muscle every 28 (twenty-eight) days. 1 each 3   [START ON 07/19/2021] citalopram (CELEXA) 20 MG tablet Take 1 tablet (20 mg total) by mouth daily. 30 tablet 1   naproxen (NAPROSYN) 500 MG tablet Take 1 tablet (500 mg total) by mouth 2 (two) times daily with a meal. 30 tablet 0   [START ON 07/19/2021] thiamine 100 MG tablet Take 1 tablet (100 mg total) by mouth daily. 30 tablet 1   traZODone (DESYREL) 100 MG tablet Take 1 tablet (100 mg total) by mouth at bedtime. 30 tablet 1   PTA Medications: No medications prior to admission.    Patient Stressors:    Patient Strengths:    Treatment Modalities: Medication Management, Group therapy, Case management,  1 to 1 session with clinician, Psychoeducation, Recreational therapy.   Physician Treatment Plan for Primary Diagnosis: Schizophrenia, undifferentiated (HCC) Long Term Goal(s): Improvement in symptoms so as ready for discharge   Short Term Goals: Ability to identify changes in lifestyle to reduce recurrence of condition will improve Ability to verbalize feelings  will improve Ability to disclose and discuss suicidal ideas Ability to demonstrate self-control will improve Ability to identify and develop effective coping behaviors will improve Ability to maintain clinical measurements within normal limits will improve Compliance with prescribed medications will improve Ability to identify triggers associated with substance abuse/mental health issues will improve  Medication Management: Evaluate patient's response, side effects, and tolerance of medication regimen.  Therapeutic Interventions: 1 to 1 sessions, Unit Group sessions and Medication administration.  Evaluation of Outcomes: Adequate for Discharge  Physician Treatment Plan for  Secondary Diagnosis: Principal Problem:   Schizophrenia, undifferentiated (HCC) Active Problems:   Cocaine abuse (HCC)  Long Term Goal(s): Improvement in symptoms so as ready for discharge   Short Term Goals: Ability to identify changes in lifestyle to reduce recurrence of condition will improve Ability to verbalize feelings will improve Ability to disclose and discuss suicidal ideas Ability to demonstrate self-control will improve Ability to identify and develop effective coping behaviors will improve Ability to maintain clinical measurements within normal limits will improve Compliance with prescribed medications will improve Ability to identify triggers associated with substance abuse/mental health issues will improve     Medication Management: Evaluate patient's response, side effects, and tolerance of medication regimen.  Therapeutic Interventions: 1 to 1 sessions, Unit Group sessions and Medication administration.  Evaluation of Outcomes: Adequate for Discharge   RN Treatment Plan for Primary Diagnosis: Schizophrenia, undifferentiated (HCC) Long Term Goal(s): Knowledge of disease and therapeutic regimen to maintain health will improve  Short Term Goals: Ability to remain free from injury will improve, Ability to verbalize frustration and anger appropriately will improve, Ability to demonstrate self-control, Ability to participate in decision making will improve, Ability to verbalize feelings will improve, Ability to disclose and discuss suicidal ideas, Ability to identify and develop effective coping behaviors will improve, and Compliance with prescribed medications will improve  Medication Management: RN will administer medications as ordered by provider, will assess and evaluate patient's response and provide education to patient for prescribed medication. RN will report any adverse and/or side effects to prescribing provider.  Therapeutic Interventions: 1 on 1 counseling  sessions, Psychoeducation, Medication administration, Evaluate responses to treatment, Monitor vital signs and CBGs as ordered, Perform/monitor CIWA, COWS, AIMS and Fall Risk screenings as ordered, Perform wound care treatments as ordered.  Evaluation of Outcomes: Adequate for Discharge   LCSW Treatment Plan for Primary Diagnosis: Schizophrenia, undifferentiated (HCC) Long Term Goal(s): Safe transition to appropriate next level of care at discharge, Engage patient in therapeutic group addressing interpersonal concerns.  Short Term Goals: Engage patient in aftercare planning with referrals and resources, Increase social support, Increase ability to appropriately verbalize feelings, Increase emotional regulation, Facilitate acceptance of mental health diagnosis and concerns, Facilitate patient progression through stages of change regarding substance use diagnoses and concerns, Identify triggers associated with mental health/substance abuse issues, and Increase skills for wellness and recovery  Therapeutic Interventions: Assess for all discharge needs, 1 to 1 time with Social worker, Explore available resources and support systems, Assess for adequacy in community support network, Educate family and significant other(s) on suicide prevention, Complete Psychosocial Assessment, Interpersonal group therapy.  Evaluation of Outcomes: Adequate for Discharge   Progress in Treatment: Attending groups: Yes. Participating in groups: Yes. Taking medication as prescribed: Yes. Toleration medication: Yes. Family/Significant other contact made: Yes, individual(s) contacted:  pt's mother Patient understands diagnosis: Yes. Discussing patient identified problems/goals with staff: Yes. Medical problems stabilized or resolved: Yes. Denies suicidal/homicidal ideation: Yes. Issues/concerns per patient self-inventory: No. Other:  None  New problem(s) identified: No, Describe:  None  New Short Term/Long Term  Goal(s): detox, elimination of symptoms of psychosis, medication management for mood stabilization; elimination of SI thoughts; development of comprehensive mental wellness/sobriety plan.   Patient Goals:  "I'm good now, I'm not hearing voices, don't wanna hurt anybody. Get an Abilify injection"  Discharge Plan or Barriers: CSW will assist pt with appropriate discharge plan  Reason for Continuation of Hospitalization: Medication stabilization  Estimated Length of Stay: 1-7 days  Scribe for Treatment Team: [place]ress Fanfan A [place], Theresia Majors 07/18/2021 2:51 PM

## 2021-07-18 NOTE — Progress Notes (Signed)
  [facility] Adult Case Management Discharge Plan :  Will you be returning to the same living situation after discharge:  Yes,  pt's mother's home At discharge, do you have transportation home?: Yes,  CSW will provide pt with transportation Do you have the ability to pay for your medications: No.  Release of information consent forms completed and in the chart;  Patient's signature needed at discharge.  Patient to Follow up at:  Follow-up Information     [facility] Follow up.   Why: Please go to walk-in hours to schedule your appointment. Walk in hours are Monday-Friday 9am-4pm. Thanks! Contact information: [address] [place] [address] [phone]         [facility] Follow up.   Why: Please go to walk-in hours to schedule your appointment. Walk-in hours are Monday, Wednesday, Friday 8am-4pm. Thanks! (You can attend walk-in hours once your [facility] county has changed). Contact information: [address] [place] [address] [phone]         [facility]. Go to.   Specialty: Urgent Care Why: This location takes your insurance. Please go to walk in hours listed below to schedule your appointment for therapy and/or medication managment. Thanks!  Therapy walk-ins: Monday - 8am - 1pm with Adam [44y/o+] Tuesday 8am-1pm with Bonne Dolores only] Wednesday 8am - 1pm with Paige [44y/o+] Thursday - NO therapy walk-ins Friday 1pm-4pm [counselors rotate Friday walk-ins]  Psychiatric/Med walk-ins: Monday 8am - 11am with Dr. Diana Eves only]  Tuesday 8am - 11am with Dr. Diana Eves only] and Denton Brick only] Wednesday 8am - 11am with Denton Brick only] Thursday 8am - 11am with Denton Brick only] Friday 8am-10am with Denton Brick only] Contact information: [address] [place] [address] [phone]                Next level of care provider has access to [facility] Link:no  Safety Planning and Suicide Prevention discussed: Yes,  completed with pt's mother  Have you used any form of tobacco in the last 30 days? (Cigarettes, Smokeless Tobacco, Cigars, and/or Pipes): Yes  Has patient been referred to the Quitline?: Patient refused referral  Patient has been referred for addiction treatment: Pt. refused referral  Dominic [name] A [place], LCSWA 07/18/2021, 10:39 AM

## 2021-07-18 NOTE — Progress Notes (Signed)
D: Pt alert and oriented. Pt rates depression 0/10 and anxiety 0/10. Pt denies experiencing any pain at this time. Pt denies experiencing any SI/HI, or AVH at this time.   A: Scheduled medications administered to pt, per MD orders. Support and encouragement provided. Frequent verbal contact made. Routine safety checks conducted q15 minutes.   R: No adverse drug reactions noted. Pt verbally contracts for safety at this time. Pt complaint with medications and treatment plan. Pt interacts well with others on the unit. Pt remains safe at this time. Will continue to monitor.

## 2021-07-18 NOTE — Discharge Summary (Signed)
Physician Discharge Summary Note  Patient:  Dominic SloughCedric Deon [name] is an 45 y.o., male MRN:  [id number] DOB:  1976-06-27 Patient phone:  [phone] (home)  Patient address:   [address] [place] [id number],  Total Time spent with patient: 35 minutes- 25 minutes face-to-face contact with patient, 10 minutes documentation, coordination of care, scripts   Date of Admission:  07/15/2021 Date of Discharge: 07/18/2021  Reason for Admission:  Worsening hallucinations, suicidal ideations, and self harm  Principal Problem: Schizophrenia, undifferentiated (HCC) Discharge Diagnoses: Principal Problem:   Schizophrenia, undifferentiated (HCC) Active Problems:   Cocaine abuse (HCC)   Past Psychiatric History: schizophrenia or schizoaffective disorder as well as cocaine and alcohol abuse.  Many prior hospital stays.  Does well on long-acting injectables but has had compliance issues.  Has had some issues of self-injury and suicidal ideation in the past    Past Medical History:  Past Medical History:  Diagnosis Date   Anxiety    Asthma    Bipolar 1 disorder (HCC)    Chronic hepatitis C without hepatic coma (HCC) 05/28/2018   Depression    Hepatitis C    Hepatitis C antibody positive in blood 05/28/2018   Schizophrenia (HCC)    Sleep apnea     Past Surgical History:  Procedure Laterality Date   CYST REMOVAL NECK     neck   Family History:  Family History  Problem Relation Age of Onset   Hypertension Sister    Schizophrenia Sister    Family Psychiatric  History: sister- schizophrenia Social History:  Social History   Substance and Sexual Activity  Alcohol Use Yes   Alcohol/week: 3.0 standard drinks   Types: 3 Shots of liquor per week     Social History   Substance and Sexual Activity  Drug Use Yes   Types: Cocaine, Marijuana   Comment: last used today    Social History   Socioeconomic History   Marital status: Single    Spouse name: Not on file   Number of  children: Not on file   Years of education: Not on file   Highest education level: Not on file  Occupational History   Not on file  Tobacco Use   Smoking status: Some Days    Packs/day: 1.00    Types: Cigarettes   Smokeless tobacco: Never  Vaping Use   Vaping Use: Never used  Substance and Sexual Activity   Alcohol use: Yes    Alcohol/week: 3.0 standard drinks    Types: 3 Shots of liquor per week   Drug use: Yes    Types: Cocaine, Marijuana    Comment: last used today   Sexual activity: Not Currently  Other Topics Concern   Not on file  Social History Narrative   Not on file   Social Determinants of Health   Financial Resource Strain: Not on file  Food Insecurity: Not on file  Transportation Needs: Not on file  Physical Activity: Not on file  Stress: Not on file  Social Connections: Not on file    Hospital Course:  45 year old man with a history of schizophrenia and substance abuse came into the hospital with self-injury, suicidal ideation, and worsening hallucinations. He was restarted on Abilify and titrated to 20 mg daily. He was given Abilify Maintenna 07/18/21 and next injection will be due 08/15/21. While on the unit his mood improved. He denies suicidal ideations, homicidal ideations, visual hallucinations, and auditory hallucinations. He plans to return home to live with his  mother and follow-up with [facility] at discharge. He requested the Abilify LAI, and states this helps him stay out of the hospital.   Physical Findings: AIMS: Facial and Oral Movements Muscles of Facial Expression: None, normal Lips and Perioral Area: None, normal Jaw: None, normal Tongue: None, normal,Extremity Movements Upper (arms, wrists, hands, fingers): None, normal Lower (legs, knees, ankles, toes): None, normal,  , Overall Severity Severity of abnormal movements (highest score from questions above): None, normal Incapacitation due to abnormal movements: None, normal Patient's awareness of  abnormal movements (rate only patient's report): No Awareness, Dental Status Current problems with teeth and/or dentures?: No Does patient usually wear dentures?: No  CIWA:  CIWA-Ar Total: 0 COWS:     Musculoskeletal: Strength & Muscle Tone: within normal limits Gait & Station: normal Patient leans: N/A   Psychiatric Specialty Exam:  Presentation  General Appearance: Appropriate for Environment; Casual  Eye Contact:Good  Speech:Clear and Coherent; Normal Rate  Speech Volume:Normal  Handedness:Right   Mood and Affect  Mood:Euthymic  Affect:Congruent   Thought Process  Thought Processes:Coherent; Goal Directed  Descriptions of Associations:Intact  Orientation:Full (Time, Place and Person)  Thought Content:Logical  History of Schizophrenia/Schizoaffective disorder:Yes  Duration of Psychotic Symptoms:Greater than six months  Hallucinations:Hallucinations: None  Ideas of Reference:None  Suicidal Thoughts:Suicidal Thoughts: No  Homicidal Thoughts:Homicidal Thoughts: No   Sensorium  Memory:Immediate Good; Recent Good; Remote Good  Judgment:Intact  Insight:Good   Executive Functions  Concentration:Good  Attention Span:Good  Recall:Good  Fund of Knowledge:Good  Language:Good   Psychomotor Activity  Psychomotor Activity:Psychomotor Activity: Normal   Assets  Assets:Communication Skills; Desire for Improvement; Financial Resources/Insurance; Housing; Physical Health; Resilience; Social Support   Sleep  Sleep:Sleep: Good Number of Hours of Sleep: 8.4    Physical Exam: Physical Exam Vitals and nursing note reviewed.  Constitutional:      Appearance: Normal appearance.  HENT:     Head: Normocephalic and atraumatic.     Right Ear: External ear normal.     Left Ear: External ear normal.     Nose: Nose normal.     Mouth/Throat:     Mouth: Mucous membranes are moist.     Pharynx: Oropharynx is clear.  Eyes:     Extraocular Movements:  Extraocular movements intact.     Conjunctiva/sclera: Conjunctivae normal.     Pupils: Pupils are equal, round, and reactive to light.     Comments: Superficial wound to eyelid healing well  Cardiovascular:     Rate and Rhythm: Normal rate and regular rhythm.     Pulses: Normal pulses.     Heart sounds: Normal heart sounds.  Pulmonary:     Effort: Pulmonary effort is normal.     Breath sounds: Normal breath sounds.  Abdominal:     General: Abdomen is flat.     Palpations: Abdomen is soft.  Musculoskeletal:        General: No swelling. Normal range of motion.     Cervical back: Normal range of motion and neck supple.  Skin:    General: Skin is warm and dry.  Neurological:     General: No focal deficit present.     Mental Status: He is alert and oriented to person, place, and time.  Psychiatric:        Mood and Affect: Mood normal.        Behavior: Behavior normal.        Thought Content: Thought content normal.        Judgment: Judgment normal.  Review of Systems  Constitutional: Negative.   HENT: Negative.    Eyes: Negative.   Respiratory: Negative.    Cardiovascular: Negative.   Gastrointestinal: Negative.   Genitourinary: Negative.   Musculoskeletal: Negative.   Skin: Negative.   Neurological: Negative.   Endo/Heme/Allergies: Negative.   Psychiatric/Behavioral:  Negative for depression, hallucinations, memory loss, substance abuse and suicidal ideas. The patient is not nervous/anxious and does not have insomnia.   Blood pressure 120/78, pulse 69, temperature 98 F (36.7 C), temperature source Oral, resp. rate 17, height 6\' 1"  (1.854 m), weight 102 kg, SpO2 94 %. Body mass index is 29.67 kg/m.   Social History   Tobacco Use  Smoking Status Some Days   Packs/day: 1.00   Types: Cigarettes  Smokeless Tobacco Never   Tobacco Cessation:  A prescription for an FDA-approved tobacco cessation medication was offered at discharge and the patient refused   Blood  Alcohol level:  Lab Results  Component Value Date   [facility] <10 07/15/2021   ETH <10 06/11/2021    Metabolic Disorder Labs:  Lab Results  Component Value Date   HGBA1C 5.3 04/08/2018   MPG 105.41 04/08/2018   No results found for: PROLACTIN Lab Results  Component Value Date   CHOL 178 07/16/2021   TRIG 61 07/16/2021   HDL 68 07/16/2021   CHOLHDL 2.6 07/16/2021   VLDL 12 07/16/2021   LDLCALC 98 07/16/2021   LDLCALC 81 04/08/2018    See Psychiatric Specialty Exam and Suicide Risk Assessment completed by Attending Physician prior to discharge.  Discharge destination:  Home  Is patient on multiple antipsychotic therapies at discharge:  No   Has Patient had three or more failed trials of antipsychotic monotherapy by history:  No  Recommended Plan for Multiple Antipsychotic Therapies: NA  Discharge Instructions     Diet general   Complete by: As directed    Increase activity slowly   Complete by: As directed       Allergies as of 07/18/2021   No Known Allergies      Medication List     STOP taking these medications    brompheniramine-pseudoephedrine-DM 30-2-10 MG/5ML syrup   ondansetron 4 MG disintegrating tablet Commonly known as: Zofran ODT       TAKE these medications      Indication  ARIPiprazole 20 MG tablet Commonly known as: ABILIFY Take 1 tablet (20 mg total) by mouth daily. Start taking on: July 19, 2021 What changed:  medication strength how much to take  Indication: Manic Phase of Manic-Depression   ARIPiprazole ER 400 MG Srer injection Commonly known as: ABILIFY MAINTENA Inject 2 mLs (400 mg total) into the muscle every 28 (twenty-eight) days. Start taking on: August 15, 2021 What changed: You were already taking a medication with the same name, and this prescription was added. Make sure you understand how and when to take each.  Indication: Manic-Depression   citalopram 20 MG tablet Commonly known as: CELEXA Take 1 tablet (20 mg  total) by mouth daily. Start taking on: July 19, 2021  Indication: Major Depressive Disorder   naproxen 500 MG tablet Commonly known as: Naprosyn Take 1 tablet (500 mg total) by mouth 2 (two) times daily with a meal.  Indication: Pain   thiamine 100 MG tablet Take 1 tablet (100 mg total) by mouth daily. Start taking on: July 19, 2021  Indication: Deficiency of Vitamin B1   traZODone 100 MG tablet Commonly known as: DESYREL Take 1 tablet (100 mg  total) by mouth at bedtime.  Indication: Trouble Sleeping         Follow-up recommendations:  Activity:  as tolearted Diet:  regular diet  Comments:  Next Abilify maintenna 400 mg IM injection due 08/15/2021. 30-day scripts with 1 refill sent to [facility] on [address] per patient request. Call 911 or proceed to nearest ED if suicidal ideations recur  Signed: Jesse Sans, MD 07/18/2021, 10:08 AM

## 2021-07-19 LAB — HEMOGLOBIN A1C
Hgb A1c MFr Bld: 5.6 % (ref 4.8–5.6)
Mean Plasma Glucose: 114 mg/dL

## 2021-07-21 ENCOUNTER — Other Ambulatory Visit: Payer: Self-pay

## 2021-07-21 ENCOUNTER — Emergency Department
Admission: EM | Admit: 2021-07-21 | Discharge: 2021-07-22 | Disposition: A | Discharge status: Home / Self Care | Payer: Medicaid Other | Attending: Emergency Medicine | Admitting: Emergency Medicine

## 2021-07-21 DIAGNOSIS — Z79899 Other long term (current) drug therapy: Secondary | ICD-10-CM | POA: Diagnosis not present

## 2021-07-21 DIAGNOSIS — F23 Brief psychotic disorder: Secondary | ICD-10-CM

## 2021-07-21 DIAGNOSIS — Z20822 Contact with and (suspected) exposure to covid-19: Secondary | ICD-10-CM | POA: Insufficient documentation

## 2021-07-21 DIAGNOSIS — F29 Unspecified psychosis not due to a substance or known physiological condition: Secondary | ICD-10-CM | POA: Diagnosis not present

## 2021-07-21 DIAGNOSIS — Z8616 Personal history of COVID-19: Secondary | ICD-10-CM | POA: Diagnosis not present

## 2021-07-21 DIAGNOSIS — Z046 Encounter for general psychiatric examination, requested by authority: Secondary | ICD-10-CM | POA: Diagnosis present

## 2021-07-21 DIAGNOSIS — J45909 Unspecified asthma, uncomplicated: Secondary | ICD-10-CM | POA: Insufficient documentation

## 2021-07-21 DIAGNOSIS — F203 Undifferentiated schizophrenia: Secondary | ICD-10-CM | POA: Diagnosis not present

## 2021-07-21 DIAGNOSIS — F1410 Cocaine abuse, uncomplicated: Secondary | ICD-10-CM | POA: Diagnosis not present

## 2021-07-21 DIAGNOSIS — F17210 Nicotine dependence, cigarettes, uncomplicated: Secondary | ICD-10-CM | POA: Diagnosis not present

## 2021-07-21 LAB — COMPREHENSIVE METABOLIC PANEL
ALT: 37 U/L (ref 0–44)
AST: 47 U/L — ABNORMAL HIGH (ref 15–41)
Albumin: 4 g/dL (ref 3.5–5.0)
Alkaline Phosphatase: 39 U/L (ref 38–126)
Anion gap: 9 (ref 5–15)
BUN: 15 mg/dL (ref 6–20)
CO2: 24 mmol/L (ref 22–32)
Calcium: 8.7 mg/dL — ABNORMAL LOW (ref 8.9–10.3)
Chloride: 102 mmol/L (ref 98–111)
Creatinine, Ser: 0.98 mg/dL (ref 0.61–1.24)
GFR, Estimated: >60 mL/min (ref >60)
Glucose, Bld: 123 mg/dL — ABNORMAL HIGH (ref 70–99)
Potassium: 3.4 mmol/L — ABNORMAL LOW (ref 3.5–5.1)
Sodium: 135 mmol/L (ref 135–145)
Total Bilirubin: 1.5 mg/dL — ABNORMAL HIGH (ref 0.3–1.2)
Total Protein: 7.8 g/dL (ref 6.5–8.1)

## 2021-07-21 LAB — CBC
HCT: 36.1 % — ABNORMAL LOW (ref 39.0–52.0)
Hemoglobin: 13.4 g/dL (ref 13.0–17.0)
MCH: 29.8 pg (ref 26.0–34.0)
MCHC: 37.1 g/dL — ABNORMAL HIGH (ref 30.0–36.0)
MCV: 80.4 fL (ref 80.0–100.0)
Platelets: 185 10*3/uL (ref 150–400)
RBC: 4.49 MIL/uL (ref 4.22–5.81)
RDW: 13.6 % (ref 11.5–15.5)
WBC: 18.9 10*3/uL — ABNORMAL HIGH (ref 4.0–10.5)
nRBC: 0 % (ref 0.0–0.2)

## 2021-07-21 LAB — ETHANOL: Alcohol, Ethyl (B): <10 mg/dL (ref <10)

## 2021-07-21 LAB — SALICYLATE LEVEL: Salicylate Lvl: <7 mg/dL — ABNORMAL LOW (ref 7.0–30.0)

## 2021-07-21 LAB — RESP PANEL BY RT-PCR (FLU A&B, COVID) ARPGX2
Influenza A by PCR: NEGATIVE
Influenza B by PCR: NEGATIVE
SARS Coronavirus 2 by RT PCR: NEGATIVE

## 2021-07-21 LAB — ACETAMINOPHEN LEVEL: Acetaminophen (Tylenol), Serum: <10 ug/mL — ABNORMAL LOW (ref 10–30)

## 2021-07-21 MED ORDER — HALOPERIDOL 5 MG PO TABS: 5.0000 mg | ORAL_TABLET | Freq: Four times a day (QID) | ORAL | Status: DC | PRN

## 2021-07-21 MED ORDER — ZIPRASIDONE MESYLATE 20 MG IM SOLR
20.0000 mg | Freq: Once | INTRAMUSCULAR | Status: AC
  Administered 2021-07-21: 20 mg via INTRAMUSCULAR
  Filled 2021-07-21: qty 20

## 2021-07-21 MED ORDER — LORAZEPAM 2 MG/ML IJ SOLN
2.0000 mg | Freq: Once | INTRAMUSCULAR | Status: AC
  Administered 2021-07-21: 2 mg via INTRAMUSCULAR
  Filled 2021-07-21: qty 1

## 2021-07-21 MED ORDER — ARIPIPRAZOLE 10 MG PO TABS
20.0000 mg | ORAL_TABLET | Freq: Every day | ORAL | Status: DC
  Administered 2021-07-21 – 2021-07-22 (×2): 20 mg via ORAL
  Filled 2021-07-21 (×2): qty 2

## 2021-07-21 MED ORDER — CITALOPRAM HYDROBROMIDE 20 MG PO TABS
20.0000 mg | ORAL_TABLET | Freq: Every day | ORAL | Status: DC
  Administered 2021-07-21 – 2021-07-22 (×2): 20 mg via ORAL
  Filled 2021-07-21 (×2): qty 1

## 2021-07-21 MED ORDER — HALOPERIDOL LACTATE 5 MG/ML IJ SOLN: 5.0000 mg | Freq: Four times a day (QID) | INTRAMUSCULAR | Status: DC | PRN
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
## 2021-07-21 NOTE — ED Notes (Signed)
[facility] pending admit per Dr. Toni Amend

## 2021-07-21 NOTE — ED Notes (Addendum)
Pt dressed out with this RN, Laymond Purser and PD. Belongings include: Black shoes Jeans Black t shirt [employer]

## 2021-07-21 NOTE — Consult Note (Signed)
[facility] Face-to-Face Psychiatry Consult   Reason for Consult: Consult for 45 year old man with a history of schizophrenia and substance abuse brought in after police found him agitated confused altered mental status in public Referring Physician: Scotty Court Patient Identification: Dominic [name] MRN:  [id number] Principal Diagnosis: Schizophrenia, undifferentiated (HCC) Diagnosis:  Principal Problem:   Schizophrenia, undifferentiated (HCC) Active Problems:   Cocaine abuse (HCC)   Total Time spent with patient: 1 hour  Subjective:   Dominic [name] is a 45 y.o. male patient admitted with "I do not know".  HPI: Patient seen chart reviewed.  45 year old man well known to the psychiatric service with a history of schizophrenia and cocaine and alcohol abuse.  Brought in after police found him partially unclothed agitated disorganized altered mental status out in public.  Patient was very confused and agitated on presentation.  Seemed to basically understand the situation but was fighting against staff yelling and screaming.  His speech did not make any sense.  He was not able to answer any questions in any sort of rational way.  Patient was given IM medication and eventually settled down and went to sleep.  He was just discharged from the hospital about 3 days ago after an admission for psychotic symptoms.  Given past history it would seem likely that he has been abusing substances but no drug screen has been obtained yet.  Patient is now resting.  Past Psychiatric History: Long history of schizophrenia and cocaine and alcohol abuse.  Risk to Self:   Risk to Others:   Prior Inpatient Therapy:   Prior Outpatient Therapy:    Past Medical History:  Past Medical History:  Diagnosis Date   Anxiety    Asthma    Bipolar 1 disorder (HCC)    Chronic hepatitis C without hepatic coma (HCC) 05/28/2018   Depression    Hepatitis C    Hepatitis C antibody positive in blood 05/28/2018   Schizophrenia  (HCC)    Sleep apnea     Past Surgical History:  Procedure Laterality Date   CYST REMOVAL NECK     neck   Family History:  Family History  Problem Relation Age of Onset   Hypertension Sister    Schizophrenia Sister    Family Psychiatric  History: See previous Social History:  Social History   Substance and Sexual Activity  Alcohol Use Yes   Alcohol/week: 3.0 standard drinks   Types: 3 Shots of liquor per week     Social History   Substance and Sexual Activity  Drug Use Yes   Types: Cocaine, Marijuana   Comment: last used today    Social History   Socioeconomic History   Marital status: Single    Spouse name: Not on file   Number of children: Not on file   Years of education: Not on file   Highest education level: Not on file  Occupational History   Not on file  Tobacco Use   Smoking status: Some Days    Packs/day: 1.00    Types: Cigarettes   Smokeless tobacco: Never  Vaping Use   Vaping Use: Never used  Substance and Sexual Activity   Alcohol use: Yes    Alcohol/week: 3.0 standard drinks    Types: 3 Shots of liquor per week   Drug use: Yes    Types: Cocaine, Marijuana    Comment: last used today   Sexual activity: Not Currently  Other Topics Concern   Not on file  Social History Narrative  Not on file   Social Determinants of Health   Financial Resource Strain: Not on file  Food Insecurity: Not on file  Transportation Needs: Not on file  Physical Activity: Not on file  Stress: Not on file  Social Connections: Not on file   Additional Social History:    Allergies:  No Known Allergies  Labs:  Results for orders placed or performed during the hospital encounter of 07/21/21 (from the past 48 hour(s))  Comprehensive metabolic panel     Status: Abnormal   Collection Time: 07/21/21  1:47 PM  Result Value Ref Range   Sodium 135 135 - 145 mmol/L   Potassium 3.4 (L) 3.5 - 5.1 mmol/L   Chloride 102 98 - 111 mmol/L   CO2 24 22 - 32 mmol/L    Glucose, Bld 123 (H) 70 - 99 mg/dL    Comment: Glucose reference range applies only to samples taken after fasting for at least 8 hours.   BUN 15 6 - 20 mg/dL   Creatinine, Ser 4.69 0.61 - 1.24 mg/dL   Calcium 8.7 (L) 8.9 - 10.3 mg/dL   Total Protein 7.8 6.5 - 8.1 g/dL   Albumin 4.0 3.5 - 5.0 g/dL   AST 47 (H) 15 - 41 U/L   ALT 37 0 - 44 U/L   Alkaline Phosphatase 39 38 - 126 U/L   Total Bilirubin 1.5 (H) 0.3 - 1.2 mg/dL   GFR, Estimated >62 >95 mL/min    Comment: (NOTE) Calculated using the CKD-EPI Creatinine Equation (2021)    Anion gap 9 5 - 15    Comment: Performed at [facility], [address] [place], [place] [address]  Ethanol     Status: None   Collection Time: 07/21/21  1:47 PM  Result Value Ref Range   Alcohol, Ethyl (B) <10 <10 mg/dL    Comment: (NOTE) Lowest detectable limit for serum alcohol is 10 mg/dL.  For medical purposes only. Performed at [facility], [address] [place], [place] [address]   Salicylate level     Status: Abnormal   Collection Time: 07/21/21  1:47 PM  Result Value Ref Range   Salicylate Lvl <7.0 (L) 7.0 - 30.0 mg/dL    Comment: Performed at [facility], [address] [place], [place] [address]  Acetaminophen level     Status: Abnormal   Collection Time: 07/21/21  1:47 PM  Result Value Ref Range   Acetaminophen (Tylenol), Serum <10 (L) 10 - 30 ug/mL    Comment: (NOTE) Therapeutic concentrations vary significantly. A range of 10-30 ug/mL  may be an effective concentration for many patients. However, some  are best treated at concentrations outside of this range. Acetaminophen concentrations >150 ug/mL at 4 hours after ingestion  and >50 ug/mL at 12 hours after ingestion are often associated with  toxic reactions.  Performed at [facility], [address] [place], [place] [address]   cbc     Status: Abnormal   Collection Time: 07/21/21  1:47 PM  Result Value Ref Range   WBC 18.9 (H)  4.0 - 10.5 K/uL   RBC 4.49 4.22 - 5.81 MIL/uL   Hemoglobin 13.4 13.0 - 17.0 g/dL   HCT 03.4 (L) 74.2 - 59.5 %   MCV 80.4 80.0 - 100.0 fL   MCH 29.8 26.0 - 34.0 pg   MCHC 37.1 (H) 30.0 - 36.0 g/dL    Comment: CORRECTED FOR COLD AGGLUTININS   RDW 13.6 11.5 - 15.5 %  Platelets 185 150 - 400 K/uL   nRBC 0.0 0.0 - 0.2 %    Comment: Performed at [facility], [address] [place], [place] [address]  Resp Panel by RT-PCR (Flu A&B, Covid) Nasopharyngeal Swab     Status: None   Collection Time: 07/21/21  2:26 PM   Specimen: Nasopharyngeal Swab; Nasopharyngeal(NP) swabs in vial transport medium  Result Value Ref Range   SARS Coronavirus 2 by RT PCR NEGATIVE NEGATIVE    Comment: (NOTE) SARS-CoV-2 target nucleic acids are NOT DETECTED.  The SARS-CoV-2 RNA is generally detectable in upper respiratory specimens during the acute phase of infection. The lowest concentration of SARS-CoV-2 viral copies this assay can detect is 138 copies/mL. A negative result does not preclude SARS-Cov-2 infection and should not be used as the sole basis for treatment or other patient management decisions. A negative result may occur with  improper specimen collection/handling, submission of specimen other than nasopharyngeal swab, presence of viral mutation(s) within the areas targeted by this assay, and inadequate number of viral copies(<138 copies/mL). A negative result must be combined with clinical observations, patient history, and epidemiological information. The expected result is Negative.  Fact Sheet for Patients:  BloggerCourse.comhttps://www.fda.gov/media/152166/download  Fact Sheet for Healthcare Providers:  SeriousBroker.ithttps://www.fda.gov/media/152162/download  This test is no t yet approved or cleared by the [place] FDA and  has been authorized for detection and/or diagnosis of SARS-CoV-2 by FDA under an Emergency Use Authorization (EUA). This EUA will remain  in effect (meaning this test can be used)  for the duration of the COVID-19 declaration under Section 564(b)(1) of the Act, 21 U.S.C.section 360bbb-3(b)(1), unless the authorization is terminated  or revoked sooner.       Influenza A by PCR NEGATIVE NEGATIVE   Influenza B by PCR NEGATIVE NEGATIVE    Comment: (NOTE) The Xpert Xpress SARS-CoV-2/FLU/RSV plus assay is intended as an aid in the diagnosis of influenza from Nasopharyngeal swab specimens and should not be used as a sole basis for treatment. Nasal washings and aspirates are unacceptable for Xpert Xpress SARS-CoV-2/FLU/RSV testing.  Fact Sheet for Patients: BloggerCourse.comhttps://www.fda.gov/media/152166/download  Fact Sheet for Healthcare Providers: SeriousBroker.ithttps://www.fda.gov/media/152162/download  This test is not yet approved or cleared by the [place] FDA and has been authorized for detection and/or diagnosis of SARS-CoV-2 by FDA under an Emergency Use Authorization (EUA). This EUA will remain in effect (meaning this test can be used) for the duration of the COVID-19 declaration under Section 564(b)(1) of the Act, 21 U.S.C. section 360bbb-3(b)(1), unless the authorization is terminated or revoked.  Performed at [facility], [address] [place], [place] [address]     Current Facility-Administered Medications  Medication Dose Route Frequency Provider Last Rate Last Admin   ARIPiprazole (ABILIFY) tablet 20 mg  20 mg Oral Daily Leiland Mihelich, Jackquline DenmarkJohn T, MD       citalopram (CELEXA) tablet 20 mg  20 mg Oral Daily Liann Spaeth T, MD       haloperidol (HALDOL) tablet 5 mg  5 mg Oral Q6H PRN Jashae Wiggs, Jackquline DenmarkJohn T, MD       Or   haloperidol lactate (HALDOL) injection 5 mg  5 mg Intramuscular Q6H PRN Eliyahu Bille, Jackquline DenmarkJohn T, MD       Current Outpatient Medications  Medication Sig Dispense Refill   ARIPiprazole (ABILIFY) 20 MG tablet Take 1 tablet (20 mg total) by mouth daily. 30 tablet 1   [START ON 08/15/2021] ARIPiprazole ER (ABILIFY MAINTENA) 400 MG SRER injection Inject 2 mLs (400  mg total) into the muscle every 28 (twenty-eight) days. 1 each 3   citalopram (CELEXA) 20 MG tablet Take 1 tablet (20 mg total) by mouth daily. 30 tablet 1   naproxen (NAPROSYN) 500 MG tablet Take 1 tablet (500 mg total) by mouth 2 (two) times daily with a meal. 30 tablet 0   thiamine 100 MG tablet Take 1 tablet (100 mg total) by mouth daily. 30 tablet 1   traZODone (DESYREL) 100 MG tablet Take 1 tablet (100 mg total) by mouth at bedtime. 30 tablet 1    Musculoskeletal: Strength & Muscle Tone: within normal limits Gait & Station: normal Patient leans: N/A            Psychiatric Specialty Exam:  Presentation  General Appearance: Appropriate for Environment; Casual  Eye Contact:Good  Speech:Clear and Coherent; Normal Rate  Speech Volume:Normal  Handedness:Right   Mood and Affect  Mood:Euthymic  Affect:Congruent   Thought Process  Thought Processes:Coherent; Goal Directed  Descriptions of Associations:Intact  Orientation:Full (Time, Place and Person)  Thought Content:Logical  History of Schizophrenia/Schizoaffective disorder:Yes  Duration of Psychotic Symptoms:Greater than six months  Hallucinations:No data recorded Ideas of Reference:None  Suicidal Thoughts:No data recorded Homicidal Thoughts:No data recorded  Sensorium  Memory:Immediate Good; Recent Good; Remote Good  Judgment:Intact  Insight:Good   Executive Functions  Concentration:Good  Attention Span:Good  Recall:Good  Fund of Knowledge:Good  Language:Good   Psychomotor Activity  Psychomotor Activity: No data recorded  Assets  Assets:Communication Skills; Desire for Improvement; Financial Resources/Insurance; Housing; Physical Health; Resilience; Social Support   Sleep  Sleep: No data recorded  Physical Exam: Physical Exam Vitals and nursing note reviewed.  Constitutional:      Appearance: Normal appearance.  HENT:     Head: Normocephalic and atraumatic.      Mouth/Throat:     Pharynx: Oropharynx is clear.  Eyes:     Pupils: Pupils are equal, round, and reactive to light.  Cardiovascular:     Rate and Rhythm: Normal rate and regular rhythm.  Pulmonary:     Effort: Pulmonary effort is normal.     Breath sounds: Normal breath sounds.  Abdominal:     General: Abdomen is flat.     Palpations: Abdomen is soft.  Musculoskeletal:        General: Normal range of motion.  Skin:    General: Skin is warm and dry.  Neurological:     General: No focal deficit present.     Mental Status: He is alert. Mental status is at baseline.  Psychiatric:        Attention and Perception: He is inattentive.        Mood and Affect: Mood normal. Affect is labile and inappropriate.        Speech: He is noncommunicative. Speech is rapid and pressured.        Behavior: Behavior is agitated.        Thought Content: Thought content is delusional.        Cognition and Memory: Cognition is impaired. Memory is impaired.        Judgment: Judgment is impulsive.   Review of Systems  Unable to perform ROS: Psychiatric disorder  Blood pressure 117/84, pulse 89, temperature 98.5 F (36.9 C), temperature source Oral, resp. rate 20, SpO2 95 %. There is no height or weight on file to calculate BMI.  Treatment Plan Summary: Medication management and Plan given patient's presentation with active psychosis and agitation he is an appropriate candidate to continue IVC  and plan for admission to psychiatric ward.  Reordered the Abilify and citalopram or old that he was given at last discharge.  Made sure he has as needed medicine available.  Case reviewed with TTS.  Once he is cooperative and has woken up and is calm and we have a COVID test week and plan on admission.  Disposition: Recommend psychiatric Inpatient admission when medically cleared.  Mordecai Rasmussen, MD 07/21/2021 4:52 PM

## 2021-07-21 NOTE — ED Notes (Signed)
Pt took tape in triage and wrapped all around face, tape taken off by officer

## 2021-07-21 NOTE — ED Notes (Signed)
Pt resting in room, nutrition and hydration offered/accepted.  Pt calm and cooperative with PO meds given, cont to monitor for changes in behaviors

## 2021-07-21 NOTE — ED Notes (Signed)
Patient unable to control his impulses demonstrating  hand body movements when speaking in rhythmic form  out load , speech incomprehensible, patient goes from one thought to another with no relation in between. Responds to his name and follows commands when directed. IM  medication provided as ordered. Patient swabbed for covid. Dr. Toni Amend at bedside unable to conduct assessment due to patients current state of mind. Safety maintained.

## 2021-07-21 NOTE — Progress Notes (Signed)
Pt lying in bed with eyes closed, resps even and unlabored. Pt difficult to arouse due to being medicated prior to arrival on unit. Unable to obtain vital signs.

## 2021-07-21 NOTE — ED Notes (Signed)
Pt talking to wall stating "I am going to kill you, shut the fuck up"

## 2021-07-21 NOTE — ED Notes (Signed)
Pt climbing on furniture in triage

## 2021-07-21 NOTE — ED Provider Notes (Signed)
[facility] Emergency Department Provider Note  ____________________________________________  Time seen: Approximately 2:07 PM  I have reviewed the triage vital signs and the nursing notes.   HISTORY  Chief Complaint Psychiatric Evaluation    Level 5 Caveat: Portions of the History and Physical including HPI and review of systems are unable to be completely obtained due to patient being a poor historian   HPI Dominic [name] is a 45 y.o. male with a past history of bipolar disorder and schizophrenia who is brought to the ED by police due to bizarre behavior.  He was found agitated, lying in the road, he tried to wrap a headphone cord around his neck.  He says that he is hearing voices that tell him to eat demons.  He is currently talking to the wall as if it or engaged in conversation with him.  He is not able to engage directly in interview and provide any meaningful responses currently.    Past Medical History:  Diagnosis Date   Anxiety    Asthma    Bipolar 1 disorder (HCC)    Chronic hepatitis C without hepatic coma (HCC) 05/28/2018   Depression    Hepatitis C    Hepatitis C antibody positive in blood 05/28/2018   Schizophrenia (HCC)    Sleep apnea      Patient Active Problem List   Diagnosis Date Noted   Left eyelid laceration 07/15/2021   COVID-19 11/19/2020   Schizophrenia, 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   Schizophrenia, chronic condition (HCC) 01/14/2019   Alcohol intoxication, uncomplicated (HCC) 01/13/2019   Asthma 11/25/2018   Nicotine dependence, cigarettes, uncomplicated 11/25/2018   Chronic hepatitis C without hepatic coma (HCC) 05/28/2018   Alcohol abuse 02/16/2016   Cocaine abuse (HCC) 02/16/2016     Past Surgical History:  Procedure Laterality Date   CYST REMOVAL NECK     neck     Prior to Admission medications   Medication Sig Start Date End Date Taking? Authorizing Provider  ARIPiprazole (ABILIFY) 20 MG tablet  Take 1 tablet (20 mg total) by mouth daily. 07/19/21   Jesse Sans, MD  ARIPiprazole ER (ABILIFY MAINTENA) 400 MG SRER injection Inject 2 mLs (400 mg total) into the muscle every 28 (twenty-eight) days. 08/15/21   Jesse Sans, MD  citalopram (CELEXA) 20 MG tablet Take 1 tablet (20 mg total) by mouth daily. 07/19/21   Jesse Sans, MD  naproxen (NAPROSYN) 500 MG tablet Take 1 tablet (500 mg total) by mouth 2 (two) times daily with a meal. 06/29/21   Triplett, Cari B, FNP  thiamine 100 MG tablet Take 1 tablet (100 mg total) by mouth daily. 07/19/21   Jesse Sans, MD  traZODone (DESYREL) 100 MG tablet Take 1 tablet (100 mg total) by mouth at bedtime. 07/18/21   Jesse Sans, MD     Allergies Patient has no known allergies.   Family History  Problem Relation Age of Onset   Hypertension Sister    Schizophrenia Sister     Social History Social History   Tobacco Use   Smoking status: Some Days    Packs/day: 1.00    Types: Cigarettes   Smokeless tobacco: Never  Vaping Use   Vaping Use: Never used  Substance Use Topics   Alcohol use: Yes    Alcohol/week: 3.0 standard drinks    Types: 3 Shots of liquor per week   Drug use: Yes    Types: Cocaine, Marijuana    Comment:  last used today    Review of Systems Level 5 Caveat: Portions of the History and Physical including HPI and review of systems are unable to be completely obtained due to patient being a poor historian    ____________________________________________   PHYSICAL EXAM:  VITAL SIGNS: ED Triage Vitals [07/21/21 1340]  Enc Vitals Group     BP 117/84     Pulse Rate 89     Resp 20     Temp 98.5 F (36.9 C)     Temp Source Oral     SpO2 95 %     Weight      Height      Head Circumference      Peak Flow      Pain Score      Pain Loc      Pain Edu?      Excl. in GC?     Vital signs reviewed, nursing assessments reviewed.   Constitutional: Awake, not oriented.  Agitated. Non-toxic  appearance. Eyes:   Conjunctivae are normal. EOMI. PERRL. ENT      Head:   Normocephalic and atraumatic.      Nose:   No congestion/rhinnorhea.       Mouth/Throat:   MMM, no pharyngeal erythema. No peritonsillar mass.       Neck:   No meningismus. Full ROM. Hematological/Lymphatic/Immunilogical:   No cervical lymphadenopathy. Cardiovascular:   RRR. Symmetric bilateral radial and DP pulses.  No murmurs. Cap refill less than 2 seconds. Respiratory:   Normal respiratory effort without tachypnea/retractions. Breath sounds are clear and equal bilaterally. No wheezes/rales/rhonchi. Gastrointestinal:   Soft and nontender. Non distended. There is no CVA tenderness.  No rebound, rigidity, or guarding. Genitourinary:   deferred Musculoskeletal:   Normal range of motion in all extremities. No joint effusions.  No lower extremity tenderness.  No edema. Neurologic:   Normal speech, incoherent language, delusional and responding to internal stimuli..  Motor grossly intact.  Skin:    Skin is warm, dry and intact. No rash noted.  No petechiae, purpura, or bullae.  ____________________________________________    LABS (pertinent positives/negatives) (all labs ordered are listed, but only abnormal results are displayed) Labs Reviewed  RESP PANEL BY RT-PCR (FLU A&B, COVID) ARPGX2  COMPREHENSIVE METABOLIC PANEL  ETHANOL  SALICYLATE LEVEL  ACETAMINOPHEN LEVEL  CBC  URINE DRUG SCREEN, QUALITATIVE ([facility] ONLY)   ____________________________________________   EKG    ____________________________________________    RADIOLOGY  No results found.  ____________________________________________   PROCEDURES Procedures  ____________________________________________    CLINICAL IMPRESSION / ASSESSMENT AND PLAN / ED COURSE  Medications ordered in the ED: Medications  ziprasidone (GEODON) injection 20 mg (has no administration in time range)  LORazepam (ATIVAN) injection 2 mg (has no  administration in time range)    Pertinent labs & imaging results that were available during my care of the patient were reviewed by me and considered in my medical decision making (see chart for details).   Dominic [name] was evaluated in Emergency Department on 07/21/2021 for the symptoms described in the history of present illness. He was evaluated in the context of the global COVID-19 pandemic, which necessitated consideration that the patient might be at risk for infection with the SARS-CoV-2 virus that causes COVID-19. Institutional protocols and algorithms that pertain to the evaluation of patients at risk for COVID-19 are in a state of rapid change based on information released by regulatory bodies including the CDC and federal and state organizations. These policies  and algorithms were followed during the patient's care in the ED.   Patient presents with acute psychosis.  Will need to IVC for safety pending psychiatric evaluation.  Will give intramuscular Geodon and Ativan for now for initial treatment of his psychosis.  The patient has been placed in psychiatric observation due to the need to provide a safe environment for the patient while obtaining psychiatric consultation and evaluation, as well as ongoing medical and medication management to treat the patient's condition.  The patient has been placed under full IVC at this time.       ____________________________________________   FINAL CLINICAL IMPRESSION(S) / ED DIAGNOSES    Final diagnoses:  Acute psychosis [facility])     ED Discharge Orders     None       Portions of this note were generated with dragon dictation software. Dictation errors may occur despite best attempts at proofreading.   Sharman Cheek, MD 07/21/21 1410

## 2021-07-21 NOTE — BH Assessment (Signed)
Comprehensive Clinical Assessment (CCA) Note  07/21/2021 Dominic [name] [id number]  Chief Complaint:  Chief Complaint  Patient presents with   Psychiatric Evaluation   Visit Diagnosis: Schizophrenia  Dominic [name] is a 45 year old male who presents to the ER via law enforcement. Per the report of the officer, the patient was found walking and laying in the street. He was having erratic behaviors and saying random things. Upon arrival to the ER, he was having the same behaviors. He was able to be redirected and follow orders, but still his speech and thoughts were incoherent. Patient was recently discharged from the [facility], after he was admitted for command hallucinations. As well as trying to cut his eye out with a knife. Patient is well known to the ER for similar presentations, but primarily for his substance use. Patient has a history of schizophrenia and being non-compliant with his medications and treatment.  CCA Screening, Triage and Referral (STR)  Patient Reported Information How did you hear about [place]? Other (Comment)  What Is the Reason for Your Visit/Call Today? Law Enforcement  How Long Has This Been Causing You Problems? 1-6 months  What Do You Feel Would Help You the Most Today? Treatment for Depression or other mood problem   Have You Recently Had Any Thoughts About Hurting Yourself? No  Are You Planning to Commit Suicide/Harm Yourself At This time? No   Have you Recently Had Thoughts About Hurting Someone [name]? No  Are You Planning to Harm Someone at This Time? No  Explanation: No data recorded  Have You Used Any Alcohol or Drugs in the Past 24 Hours? Yes  How Long Ago Did You Use Drugs or Alcohol? No data recorded What Did You Use and How Much? History of cocaine, alcohol & cannabis   Do You Currently Have a Therapist/Psychiatrist? No  Name of Therapist/Psychiatrist: No data recorded  Have You Been Recently Discharged From Any Office Practice or  Programs? No  Explanation of Discharge From Practice/Program: No data recorded    CCA Screening Triage Referral Assessment Type of Contact: Face-to-Face  Telemedicine Service Delivery:   Is this Initial or Reassessment? No data recorded Date Telepsych consult ordered in [facility]:  08/24/20  Time Telepsych consult ordered in [facility]  0915  Location of Assessment: [facility] ED  Provider Location: [facility] ED   Collateral Involvement: Law Enforcement that brought him to the ER   Does Patient Have a Automotive engineer Guardian? No data recorded Name and Contact of Legal Guardian: Self  If Minor and Not Living with Parent(s), Who has Custody? n/a  Is CPS involved or ever been involved? Never  Is APS involved or ever been involved? Never   Patient Determined To Be At Risk for Harm To Self or Others Based on Review of Patient Reported Information or Presenting Complaint? No  Method: No data recorded Availability of Means: No data recorded Intent: No data recorded Notification Required: No data recorded Additional Information for Danger to Others Potential: No data recorded Additional Comments for Danger to Others Potential: No data recorded Are There Guns or Other Weapons in Your Home? No data recorded Types of Guns/Weapons: No data recorded Are These Weapons Safely Secured?                            No data recorded Who Could Verify You Are Able To Have These Secured: No data recorded Do You Have any Outstanding Charges, Pending  Court Dates, Parole/Probation? No data recorded Contacted To Inform of Risk of Harm To Self or Others: No data recorded   Does Patient Present under Involuntary Commitment? Yes  IVC Papers Initial File Date: 06/11/21   [place] of Residence: [place]   Patient Currently Receiving the Following Services: Not Receiving Services   Determination of Need: Emergent (2 hours)   Options For Referral: ED Visit     CCA Biopsychosocial Patient Reported  Schizophrenia/Schizoaffective Diagnosis in Past: Yes   Strengths: Have some support, some insight & know he need his medications   Mental Health Symptoms Depression:   Hopelessness; Tearfulness; Worthlessness   Duration of Depressive symptoms:  Duration of Depressive Symptoms: Greater than two weeks   Mania:   Increased Energy; Racing thoughts; Recklessness; Change in energy/activity   Anxiety:    Irritability; Restlessness   Psychosis:   Grossly disorganized speech   Duration of Psychotic symptoms:  Duration of Psychotic Symptoms: Greater than six months   Trauma:   N/A   Obsessions:   N/A   Compulsions:   N/A   Inattention:   N/A   Hyperactivity/Impulsivity:   N/A   Oppositional/Defiant Behaviors:   N/A   Emotional Irregularity:   N/A   Other Mood/Personality Symptoms:  No data recorded   Mental Status Exam Appearance and self-care  Stature:   Average   Weight:   Average weight   Clothing:   Neat/clean; Age-appropriate   Grooming:   Normal   Cosmetic use:   Age appropriate   Posture/gait:   Normal   Motor activity:   -- (Within normal range)   Sensorium  Attention:   Normal   Concentration:   Normal   Orientation:   X5   Recall/memory:   Normal   Affect and Mood  Affect:   Appropriate; Full Range; Depressed   Mood:   Depressed; Anxious   Relating  Eye contact:   None   Facial expression:   Constricted   Attitude toward examiner:   Argumentative; Sarcastic; Suspicious   Thought and Language  Speech flow:  Garbled; Pressured; Profane; Slurred; Articulation error   Thought content:   Suspicious; Ideas of Reference   Preoccupation:   Obsessions   Hallucinations:   Other (Comment)   Organization:  No data recorded  [employer] of Knowledge:   Fair   Intelligence:   Average   Abstraction:   Functional   Judgement:   Impaired   Reality Testing:   Adequate   Insight:   Poor;  None/zero insight   Decision Making:   Confused; Impulsive   Social Functioning  Social Maturity:   Irresponsible; Impulsive   Social Judgement:   "Street Smart"   Stress  Stressors:   Other (Comment)   Coping Ability:   Overwhelmed; Deficient supports   Skill Deficits:   None   Supports:   Family; Friends/Service system     Religion: Religion/Spirituality Are You A Religious Person?: No  Leisure/Recreation: Leisure / Recreation Do You Have Hobbies?: No  Exercise/Diet: Exercise/Diet Do You Exercise?: No Have You Gained or Lost A Significant Amount of Weight in the Past Six Months?: No Do You Follow a Special Diet?: No Do You Have Any Trouble Sleeping?: No   CCA Employment/Education Employment/Work Situation: Employment / Work Situation Employment Situation: On disability Why is Patient on Disability: Mental Health How Long has Patient Been on Disability: Unknown Patient's Job has Been Impacted by Current Illness: No Has Patient ever Been in the  Military?: No  Education: Education Is Patient Currently Attending School?: No Did You Have An Individualized Education Program (IIEP): No Did You Have Any Difficulty At School?: No Patient's Education Has Been Impacted by Current Illness: No   CCA Family/Childhood History Family and Relationship History: Family history Marital status: Long term relationship Does patient have children?: Yes How many children?: 2 How is patient's relationship with their children?: Per previous visits, he states they are good.  Childhood History:  Childhood History By whom was/is the patient raised?: Mother Did patient suffer any verbal/emotional/physical/sexual abuse as a child?: No Did patient suffer from severe childhood neglect?: No Has patient ever been sexually abused/assaulted/raped as an adolescent or adult?: No Was the patient ever a victim of a crime or a disaster?: No Witnessed domestic violence?: No Has  patient been affected by domestic violence as an adult?: No  Child/Adolescent Assessment:    CCA Substance Use Alcohol/Drug Use: Alcohol / Drug Use Pain Medications: See PTA Prescriptions: See PTA Over the Counter: See PTA History of alcohol / drug use?: Yes Longest period of sobriety (when/how long): Unable to quantify Substance #1 Name of Substance 1: Alcohol Substance #2 Name of Substance 2: Cocaine Substance #3 Name of Substance 3: Cannabis   ASAM's:  Six Dimensions of Multidimensional Assessment  Dimension 1:  Acute Intoxication and/or Withdrawal Potential:      Dimension 2:  Biomedical Conditions and Complications:      Dimension 3:  Emotional, Behavioral, or Cognitive Conditions and Complications:     Dimension 4:  Readiness to Change:     Dimension 5:  Relapse, Continued use, or Continued Problem Potential:     Dimension 6:  Recovery/Living Environment:     ASAM Severity Score:    ASAM Recommended Level of Treatment:     Substance use Disorder (SUD)    Recommendations for Services/Supports/Treatments:    Discharge Disposition:    DSM5 Diagnoses: Patient Active Problem List   Diagnosis Date Noted   Left eyelid laceration 07/15/2021   COVID-19 11/19/2020   Schizophrenia, 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   Schizophrenia, chronic condition (HCC) 01/14/2019   Alcohol intoxication, uncomplicated (HCC) 01/13/2019   Asthma 11/25/2018   Nicotine dependence, cigarettes, uncomplicated 11/25/2018   Chronic hepatitis C without hepatic coma (HCC) 05/28/2018   Alcohol abuse 02/16/2016   Cocaine abuse (HCC) 02/16/2016     Referrals to Alternative Service(s): Referred to Alternative Service(s):   Place:   Date:   Time:    Referred to Alternative Service(s):   Place:   Date:   Time:    Referred to Alternative Service(s):   Place:   Date:   Time:    Referred to Alternative Service(s):   Place:   Date:   Time:     Lilyan Gilford MS, LCAS, [facility],  [facility] Therapeutic Triage Specialist 07/21/2021 4:27 PM

## 2021-07-22 LAB — URINE DRUG SCREEN, QUALITATIVE (ARMC ONLY)
Amphetamines, Ur Screen: NOT DETECTED
Barbiturates, Ur Screen: NOT DETECTED
Benzodiazepine, Ur Scrn: POSITIVE — AB
Cannabinoid 50 Ng, Ur ~~LOC~~: POSITIVE — AB
Cocaine Metabolite,Ur ~~LOC~~: POSITIVE — AB
MDMA (Ecstasy)Ur Screen: NOT DETECTED
Methadone Scn, Ur: NOT DETECTED
Opiate, Ur Screen: NOT DETECTED
Phencyclidine (PCP) Ur S: NOT DETECTED
Tricyclic, Ur Screen: NOT DETECTED

## 2021-07-22 LAB — RESP PANEL BY RT-PCR (FLU A&B, COVID) ARPGX2
Influenza A by PCR: NEGATIVE
Influenza B by PCR: NEGATIVE
SARS Coronavirus 2 by RT PCR: NEGATIVE

## 2021-07-22 LAB — POC SARS CORONAVIRUS 2 AG -  ED: SARSCOV2ONAVIRUS 2 AG: NEGATIVE

## 2021-07-22 MED ORDER — ARIPIPRAZOLE 20 MG PO TABS: 20.0000 mg | ORAL_TABLET | Freq: Every day | ORAL | 1 refills | Status: DC

## 2021-07-22 MED ORDER — CITALOPRAM HYDROBROMIDE 20 MG PO TABS: 20.0000 mg | ORAL_TABLET | Freq: Every day | ORAL | 1 refills | Status: DC

## 2021-07-22 NOTE — ED Notes (Signed)
IVC pending placement 

## 2021-07-22 NOTE — Consult Note (Signed)
[facility] Face-to-Face Psychiatry Consult   Reason for Consult: Follow-up consult for this 45 year old man with schizophrenia and cocaine and alcohol abuse Referring Physician: Katrinka Blazing Patient Identification: Dominic [name] MRN:  [id number] Principal Diagnosis: Schizophrenia, undifferentiated (HCC) Diagnosis:  Principal Problem:   Schizophrenia, undifferentiated (HCC) Active Problems:   Cocaine abuse (HCC)   Total Time spent with patient: 30 minutes  Subjective:   Dominic [name] is a 45 y.o. male patient admitted with "I need to get my life together".  HPI: Patient seen chart reviewed.  Patient was lucid cooperative and pleasant today although a little euphoric.  He admits that after leaving the hospital last time he used cocaine and was drinking.  Patient today is lucid.  He talks about voices telling him to do things yesterday but is not hearing voices today.  He told me that he had contacted [facility] the substance abuse facility in [place] and had been accepted there.  To my surprise when I called them they confirmed this that they had offered him a bed and could take him there.  Patient is not threatening not agitated not hostile or suicidal right now and is alert and oriented and able to make decisions.  Past Psychiatric History: Long history of schizophrenia complicated by cocaine and alcohol abuse  Risk to Self:   Risk to Others:   Prior Inpatient Therapy:   Prior Outpatient Therapy:    Past Medical History:  Past Medical History:  Diagnosis Date   Anxiety    Asthma    Bipolar 1 disorder (HCC)    Chronic hepatitis C without hepatic coma (HCC) 05/28/2018   Depression    Hepatitis C    Hepatitis C antibody positive in blood 05/28/2018   Schizophrenia (HCC)    Sleep apnea     Past Surgical History:  Procedure Laterality Date   CYST REMOVAL NECK     neck   Family History:  Family History  Problem Relation Age of Onset   Hypertension Sister    Schizophrenia  Sister    Family Psychiatric  History: See previous.  Several family members with mental health problems as well Social History:  Social History   Substance and Sexual Activity  Alcohol Use Yes   Alcohol/week: 3.0 standard drinks   Types: 3 Shots of liquor per week     Social History   Substance and Sexual Activity  Drug Use Yes   Types: Cocaine, Marijuana   Comment: last used today    Social History   Socioeconomic History   Marital status: Single    Spouse name: Not on file   Number of children: Not on file   Years of education: Not on file   Highest education level: Not on file  Occupational History   Not on file  Tobacco Use   Smoking status: Some Days    Packs/day: 1.00    Types: Cigarettes   Smokeless tobacco: Never  Vaping Use   Vaping Use: Never used  Substance and Sexual Activity   Alcohol use: Yes    Alcohol/week: 3.0 standard drinks    Types: 3 Shots of liquor per week   Drug use: Yes    Types: Cocaine, Marijuana    Comment: last used today   Sexual activity: Not Currently  Other Topics Concern   Not on file  Social History Narrative   Not on file   Social Determinants of Health   Financial Resource Strain: Not on file  Food Insecurity: Not  on file  Transportation Needs: Not on file  Physical Activity: Not on file  Stress: Not on file  Social Connections: Not on file   Additional Social History:    Allergies:  No Known Allergies  Labs:  Results for orders placed or performed during the hospital encounter of 07/21/21 (from the past 48 hour(s))  Urine Drug Screen, Qualitative     Status: Abnormal   Collection Time: 07/21/21  7:56 AM  Result Value Ref Range   Tricyclic, Ur Screen NONE DETECTED NONE DETECTED   Amphetamines, Ur Screen NONE DETECTED NONE DETECTED   MDMA (Ecstasy)Ur Screen NONE DETECTED NONE DETECTED   Cocaine Metabolite,Ur [place] POSITIVE (A) NONE DETECTED   Opiate, Ur Screen NONE DETECTED NONE DETECTED   Phencyclidine (PCP) Ur  S NONE DETECTED NONE DETECTED   Cannabinoid 50 Ng, Ur [place] POSITIVE (A) NONE DETECTED   Barbiturates, Ur Screen NONE DETECTED NONE DETECTED   Benzodiazepine, Ur Scrn POSITIVE (A) NONE DETECTED   Methadone Scn, Ur NONE DETECTED NONE DETECTED    Comment: (NOTE) Tricyclics + metabolites, urine    Cutoff 1000 ng/mL Amphetamines + metabolites, urine  Cutoff 1000 ng/mL MDMA (Ecstasy), urine              Cutoff 500 ng/mL Cocaine Metabolite, urine          Cutoff 300 ng/mL Opiate + metabolites, urine        Cutoff 300 ng/mL Phencyclidine (PCP), urine         Cutoff 25 ng/mL Cannabinoid, urine                 Cutoff 50 ng/mL Barbiturates + metabolites, urine  Cutoff 200 ng/mL Benzodiazepine, urine              Cutoff 200 ng/mL Methadone, urine                   Cutoff 300 ng/mL  The urine drug screen provides only a preliminary, unconfirmed analytical test result and should not be used for non-medical purposes. Clinical consideration and professional judgment should be applied to any positive drug screen result due to possible interfering substances. A more specific alternate chemical method must be used in order to obtain a confirmed analytical result. Gas chromatography / mass spectrometry (GC/MS) is the preferred confirm atory method. Performed at [facility], [address] [place], [place] [id number]   Comprehensive metabolic panel     Status: Abnormal   Collection Time: 07/21/21  1:47 PM  Result Value Ref Range   Sodium 135 135 - 145 mmol/L   Potassium 3.4 (L) 3.5 - 5.1 mmol/L   Chloride 102 98 - 111 mmol/L   CO2 24 22 - 32 mmol/L   Glucose, Bld 123 (H) 70 - 99 mg/dL    Comment: Glucose reference range applies only to samples taken after fasting for at least 8 hours.   BUN 15 6 - 20 mg/dL   Creatinine, Ser 2.130.98 0.61 - 1.24 mg/dL   Calcium 8.7 (L) 8.9 - 10.3 mg/dL   Total Protein 7.8 6.5 - 8.1 g/dL   Albumin 4.0 3.5 - 5.0 g/dL   AST 47 (H) 15 - 41 U/L   ALT 37 0 - 44  U/L   Alkaline Phosphatase 39 38 - 126 U/L   Total Bilirubin 1.5 (H) 0.3 - 1.2 mg/dL   GFR, Estimated >08>60 >65>60 mL/min    Comment: (NOTE) Calculated using the CKD-EPI Creatinine Equation (2021)  Anion gap 9 5 - 15    Comment: Performed at [facility], [address] [place], [place] [address]  Ethanol     Status: None   Collection Time: 07/21/21  1:47 PM  Result Value Ref Range   Alcohol, Ethyl (B) <10 <10 mg/dL    Comment: (NOTE) Lowest detectable limit for serum alcohol is 10 mg/dL.  For medical purposes only. Performed at [facility], [address] [place], [place] [address]   Salicylate level     Status: Abnormal   Collection Time: 07/21/21  1:47 PM  Result Value Ref Range   Salicylate Lvl <7.0 (L) 7.0 - 30.0 mg/dL    Comment: Performed at [facility], [address] [place], [place] [address]  Acetaminophen level     Status: Abnormal   Collection Time: 07/21/21  1:47 PM  Result Value Ref Range   Acetaminophen (Tylenol), Serum <10 (L) 10 - 30 ug/mL    Comment: (NOTE) Therapeutic concentrations vary significantly. A range of 10-30 ug/mL  may be an effective concentration for many patients. However, some  are best treated at concentrations outside of this range. Acetaminophen concentrations >150 ug/mL at 4 hours after ingestion  and >50 ug/mL at 12 hours after ingestion are often associated with  toxic reactions.  Performed at [facility], [address] [place], [place] [address]   cbc     Status: Abnormal   Collection Time: 07/21/21  1:47 PM  Result Value Ref Range   WBC 18.9 (H) 4.0 - 10.5 K/uL   RBC 4.49 4.22 - 5.81 MIL/uL   Hemoglobin 13.4 13.0 - 17.0 g/dL   HCT 57.8 (L) 46.9 - 62.9 %   MCV 80.4 80.0 - 100.0 fL   MCH 29.8 26.0 - 34.0 pg   MCHC 37.1 (H) 30.0 - 36.0 g/dL    Comment: CORRECTED FOR COLD AGGLUTININS   RDW 13.6 11.5 - 15.5 %   Platelets 185 150 - 400 K/uL   nRBC 0.0 0.0 - 0.2 %    Comment:  Performed at [facility], [address] [place], [place] [address]  Resp Panel by RT-PCR (Flu A&B, Covid) Nasopharyngeal Swab     Status: None   Collection Time: 07/21/21  2:26 PM   Specimen: Nasopharyngeal Swab; Nasopharyngeal(NP) swabs in vial transport medium  Result Value Ref Range   SARS Coronavirus 2 by RT PCR NEGATIVE NEGATIVE    Comment: (NOTE) SARS-CoV-2 target nucleic acids are NOT DETECTED.  The SARS-CoV-2 RNA is generally detectable in upper respiratory specimens during the acute phase of infection. The lowest concentration of SARS-CoV-2 viral copies this assay can detect is 138 copies/mL. A negative result does not preclude SARS-Cov-2 infection and should not be used as the sole basis for treatment or other patient management decisions. A negative result may occur with  improper specimen collection/handling, submission of specimen other than nasopharyngeal swab, presence of viral mutation(s) within the areas targeted by this assay, and inadequate number of viral copies(<138 copies/mL). A negative result must be combined with clinical observations, patient history, and epidemiological information. The expected result is Negative.  Fact Sheet for Patients:  BloggerCourse.com  Fact Sheet for Healthcare Providers:  SeriousBroker.it  This test is no t yet approved or cleared by the [place] FDA and  has been authorized for detection and/or diagnosis of SARS-CoV-2 by FDA under an Emergency Use Authorization (EUA). This EUA will remain  in effect (meaning this test can be used)  for the duration of the COVID-19 declaration under Section 564(b)(1) of the Act, 21 U.S.C.section 360bbb-3(b)(1), unless the authorization is terminated  or revoked sooner.       Influenza A by PCR NEGATIVE NEGATIVE   Influenza B by PCR NEGATIVE NEGATIVE    Comment: (NOTE) The Xpert Xpress SARS-CoV-2/FLU/RSV plus assay is intended  as an aid in the diagnosis of influenza from Nasopharyngeal swab specimens and should not be used as a sole basis for treatment. Nasal washings and aspirates are unacceptable for Xpert Xpress SARS-CoV-2/FLU/RSV testing.  Fact Sheet for Patients: BloggerCourse.com  Fact Sheet for Healthcare Providers: SeriousBroker.it  This test is not yet approved or cleared by the [place] FDA and has been authorized for detection and/or diagnosis of SARS-CoV-2 by FDA under an Emergency Use Authorization (EUA). This EUA will remain in effect (meaning this test can be used) for the duration of the COVID-19 declaration under Section 564(b)(1) of the Act, 21 U.S.C. section 360bbb-3(b)(1), unless the authorization is terminated or revoked.  Performed at [facility], [address] [place], [place] [address]     Current Facility-Administered Medications  Medication Dose Route Frequency Provider Last Rate Last Admin   ARIPiprazole (ABILIFY) tablet 20 mg  20 mg Oral Daily Yomar Mejorado T, MD   20 mg at 07/22/21 1941   citalopram (CELEXA) tablet 20 mg  20 mg Oral Daily Jamia Hoban, Jackquline Denmark, MD   20 mg at 07/22/21 7408   haloperidol (HALDOL) tablet 5 mg  5 mg Oral Q6H PRN Cleotha Tsang, Jackquline Denmark, MD       Or   haloperidol lactate (HALDOL) injection 5 mg  5 mg Intramuscular Q6H PRN Maryellen Dowdle, Jackquline Denmark, MD       Current Outpatient Medications  Medication Sig Dispense Refill   ARIPiprazole (ABILIFY) 20 MG tablet Take 1 tablet (20 mg total) by mouth daily. 30 tablet 1   [START ON 08/15/2021] ARIPiprazole ER (ABILIFY MAINTENA) 400 MG SRER injection Inject 2 mLs (400 mg total) into the muscle every 28 (twenty-eight) days. 1 each 3   citalopram (CELEXA) 20 MG tablet Take 1 tablet (20 mg total) by mouth daily. 30 tablet 1   naproxen (NAPROSYN) 500 MG tablet Take 1 tablet (500 mg total) by mouth 2 (two) times daily with a meal. 30 tablet 0   thiamine 100 MG tablet  Take 1 tablet (100 mg total) by mouth daily. 30 tablet 1   traZODone (DESYREL) 100 MG tablet Take 1 tablet (100 mg total) by mouth at bedtime. 30 tablet 1    Musculoskeletal: Strength & Muscle Tone: within normal limits Gait & Station: normal Patient leans: N/A            Psychiatric Specialty Exam:  Presentation  General Appearance: Appropriate for Environment; Casual  Eye Contact:Good  Speech:Clear and Coherent; Normal Rate  Speech Volume:Normal  Handedness:Right   Mood and Affect  Mood:Euthymic  Affect:Congruent   Thought Process  Thought Processes:Coherent; Goal Directed  Descriptions of Associations:Intact  Orientation:Full (Time, Place and Person)  Thought Content:Logical  History of Schizophrenia/Schizoaffective disorder:Yes  Duration of Psychotic Symptoms:Greater than six months  Hallucinations:No data recorded Ideas of Reference:None  Suicidal Thoughts:No data recorded Homicidal Thoughts:No data recorded  Sensorium  Memory:Immediate Good; Recent Good; Remote Good  Judgment:Intact  Insight:Good   Executive Functions  Concentration:Good  Attention Span:Good  Recall:Good  Fund of Knowledge:Good  Language:Good   Psychomotor Activity  Psychomotor Activity: No data recorded  Assets  Assets:Communication Skills; Desire for Improvement;  Financial Resources/Insurance; Housing; Physical Health; Resilience; Social Support   Sleep  Sleep: No data recorded  Physical Exam: Physical Exam Vitals and nursing note reviewed.  Constitutional:      Appearance: Normal appearance.  HENT:     Head: Normocephalic and atraumatic.     Mouth/Throat:     Pharynx: Oropharynx is clear.  Eyes:     Pupils: Pupils are equal, round, and reactive to light.  Cardiovascular:     Rate and Rhythm: Normal rate and regular rhythm.  Pulmonary:     Effort: Pulmonary effort is normal.     Breath sounds: Normal breath sounds.  Abdominal:      General: Abdomen is flat.     Palpations: Abdomen is soft.  Musculoskeletal:        General: Normal range of motion.  Skin:    General: Skin is warm and dry.  Neurological:     General: No focal deficit present.     Mental Status: He is alert. Mental status is at baseline.  Psychiatric:        Attention and Perception: Attention normal.        Mood and Affect: Mood normal.        Thought Content: Thought content normal.   Review of Systems  Constitutional: Negative.   HENT: Negative.    Eyes: Negative.   Respiratory: Negative.    Cardiovascular: Negative.   Gastrointestinal: Negative.   Musculoskeletal: Negative.   Skin: Negative.   Neurological: Negative.   Psychiatric/Behavioral: Negative.    Blood pressure 131/77, pulse (!) 51, temperature 98 F (36.7 C), temperature source Oral, resp. rate 16, height 6\' 1"  (1.854 m), weight 99.8 kg, SpO2 97 %. Body mass index is 29.03 kg/m.  Treatment Plan Summary: Plan I have confirmed with [facility] house that they are willing to admit him.  Their only stipulation is that he have a negative COVID test within 24 hours of when he hits their door.  Since yesterday's was at 2:30 in the afternoon we are better off getting another test and documenting it so that they will not turn them away.  I have spoke with TTS and we are going to work on arranging safe transport.  Patient does not meet commitment criteria and will be taken off of IVC to facilitate discharge planning.  Case being reviewed with ER physician as well.  Disposition:  see above plan  [facility], MD 07/22/2021 1:04 PM

## 2021-07-22 NOTE — ED Notes (Signed)
Pt ate 100% of meal tray. Pt took shower, given new clothing. Declined new linens. Encouraged patient to clean room and bring trash to nurses station.

## 2021-07-22 NOTE — ED Notes (Signed)
[facility]SP[facility]  CALLED  TO  TRANSPORT  PT  TO  [facility]  [facility] [place]

## 2021-07-22 NOTE — ED Notes (Signed)
IVC  PAPERS  RESCINDED  PER  DR  CLAPACS  MD INFORMED  ALLY  RN 

## 2021-07-22 NOTE — ED Notes (Signed)
Pt to be discharged and will go to [facility] house via Safe transport.

## 2021-07-22 NOTE — ED Notes (Signed)
Unlocked bathroom door to allow patient to use restroom. Pt exited and bathroom door locked behind patient by staff.  

## 2021-07-22 NOTE — ED Notes (Signed)
Pt A/Ox4, Discharged to Home/Self care.  All Pt belongings returned and accounted for.  No home medication brought into hospital.  Pt was ambulatory and left via [facility] transport to the [facility] in [place].  AVS reviewed pt stated understanding.

## 2021-07-22 NOTE — Progress Notes (Signed)
Pt accepted and ate 100% of Lunch tray. Pt calm, sitting in day room socializing with peers

## 2021-07-22 NOTE — BH Assessment (Signed)
Patient gave verbal permission for writer to forwarded his COVID results to [name].  Writer spoke with [employer] (Daryl Rivers-[phone]) and confirmed they received the information.

## 2021-07-22 NOTE — BH Assessment (Addendum)
Per Dr. Toni Amend, Patient accepted to [facility] [address] [place], [place] [address]  Staff Member to contact for any changes or concerns is Javon Brown-[phone].  Writer updated ER Sect. Misty Stanley).

## 2021-07-22 NOTE — ED Provider Notes (Signed)
Emergency Medicine Observation Re-evaluation Note  Dominic [name] is a 45 y.o. male, seen on rounds today.  Pt initially presented to the ED for complaints of Psychiatric Evaluation Currently, the patient is resting comfortably.  Physical Exam  BP 117/84   Pulse 89   Temp 98.5 F (36.9 C) (Oral)   Resp 20   Ht 6\' 1"  (1.854 m)   Wt 99.8 kg   SpO2 95%   BMI 29.03 kg/m  Physical Exam Constitutional:      Appearance: He is not ill-appearing or toxic-appearing.  HENT:     Head: Atraumatic.  Cardiovascular:     Comments: Appears well perfused Pulmonary:     Effort: Pulmonary effort is normal.  Abdominal:     General: There is no distension.  Musculoskeletal:        General: No deformity.  Neurological:     General: No focal deficit present.    ED Course / MDM  EKG:   I have reviewed the labs performed to date as well as medications administered while in observation.  Recent changes in the last 24 hours include IVC and moved to the BHU. Psych has seen and recommending inpatient psych placement  Plan  Current plan is for placement. Dominic [name] is under involuntary commitment.      Delford Field, MD 07/22/21 [phone]

## 2021-07-27 ENCOUNTER — Encounter (HOSPITAL_COMMUNITY): Payer: Self-pay | Admitting: Physician Assistant

## 2021-07-27 ENCOUNTER — Ambulatory Visit (INDEPENDENT_AMBULATORY_CARE_PROVIDER_SITE_OTHER): Payer: Medicaid Other | Admitting: Physician Assistant

## 2021-07-27 ENCOUNTER — Other Ambulatory Visit: Payer: Self-pay

## 2021-07-27 ENCOUNTER — Ambulatory Visit (HOSPITAL_COMMUNITY): Payer: Medicaid Other | Admitting: Licensed Clinical Social Worker

## 2021-07-27 VITALS — BP 133/74 | HR 90 | Ht 73.0 in | Wt 205.0 lb

## 2021-07-27 DIAGNOSIS — F209 Schizophrenia, unspecified: Secondary | ICD-10-CM

## 2021-07-27 DIAGNOSIS — F411 Generalized anxiety disorder: Secondary | ICD-10-CM | POA: Diagnosis not present

## 2021-07-27 DIAGNOSIS — F1010 Alcohol abuse, uncomplicated: Secondary | ICD-10-CM

## 2021-07-27 DIAGNOSIS — F5105 Insomnia due to other mental disorder: Secondary | ICD-10-CM | POA: Insufficient documentation

## 2021-07-27 DIAGNOSIS — R52 Pain, unspecified: Secondary | ICD-10-CM | POA: Insufficient documentation

## 2021-07-27 DIAGNOSIS — F99 Mental disorder, not otherwise specified: Secondary | ICD-10-CM

## 2021-07-27 MED ORDER — CITALOPRAM HYDROBROMIDE 20 MG PO TABS: 20.0000 mg | ORAL_TABLET | Freq: Every day | ORAL | 1 refills | Status: DC

## 2021-07-27 MED ORDER — NAPROXEN 500 MG PO TABS: 500.0000 mg | ORAL_TABLET | Freq: Two times a day (BID) | ORAL | 0 refills | Status: DC

## 2021-07-27 MED ORDER — TRAZODONE HCL 100 MG PO TABS: 100.0000 mg | ORAL_TABLET | Freq: Every day | ORAL | 1 refills | Status: DC

## 2021-07-27 MED ORDER — ARIPIPRAZOLE 20 MG PO TABS: 20.0000 mg | ORAL_TABLET | Freq: Every day | ORAL | 0 refills | Status: DC

## 2021-07-27 MED ORDER — THIAMINE HCL 100 MG PO TABS: 100.0000 mg | ORAL_TABLET | Freq: Every day | ORAL | 0 refills | Status: DC

## 2021-07-27 NOTE — Progress Notes (Signed)
BH MD/PA/NP OP Progress Note  07/27/2021 8:50 PM Dominic [name]  MRN:  [id number]  Chief Complaint:  Chief Complaint   Stress    HPI:   Dominic [name] is a 45 year old male with a past psychiatric history significant for schizophrenia, insomnia, anxiety, and alcohol abuse who presents to [facility] due to stress.  Patient is currently being managed on the following medications:  Aripiprazole 20 mg daily Trazodone 100 mg at bedtime Citalopram 20 mg daily  Patient reports that he recently tried to kill himself by walking into traffic.  Following the suicide attempt patient was taken to [facility] for management of his suicidal ideations.  Upon discharge, patient was placed on aripiprazole 20 mg daily, trazodone 100 mg at bedtime, and citalopram 20 mg daily.  Patient is also scheduled to eventually start receiving Abilify Maintena injections.  Patient was also provided thiamine supplement due to his alcohol abuse as well as naproxen for the management of the pain he endured due to walking barefoot.  Patient states that he currently feels great but he still continues to struggle with some depressive episodes.  Patient endorses the following depressive symptoms: decreased energy, lack of motivation, irritability, low mood, and feelings of guilt/worthlessness. Patient also endorses anxiety he rates an 8 out of 10.  Triggers to his anxiety include an event happening in June of next year and trying to receive visiting passes to see his daughter.  Patient denies any new stressors and is currently enrolled at [facility] house. A [place] Suicide Severity Rating Scale was performed with patient being considered high risk.  Patient denies feeling like a danger to himself and is able to contract for safety following the conclusion of the encounter.  Patient is alert and oriented x4, pleasant, calm, cooperative, and fully engaged in conversation during the encounter.   Patient endorses being in a great mood.  Patient denies suicidal or homicidal ideations.  He further denies auditory or visual hallucinations and does not appear to be responding to internal/external stimuli.  Patient endorses fair sleep but expresses that being on trazodone has improved his sleep greatly.  Patient endorses good appetite and eats on average 3 meals per day.  Patient denies active alcohol consumption, tobacco use, and illicit drug use.  In the past, patient has used cocaine.   Visit Diagnosis:    ICD-10-CM   1. Schizophrenia, chronic condition (HCC)  F20.9 ARIPiprazole (ABILIFY) 20 MG tablet    2. Insomnia due to other mental disorder  F51.05 traZODone (DESYREL) 100 MG tablet   F99     3. Anxiety state  F41.1 citalopram (CELEXA) 20 MG tablet    4. Alcohol abuse  F10.10 thiamine 100 MG tablet    5. Mild pain  R52 naproxen (NAPROSYN) 500 MG tablet      Past Psychiatric History:  Insomnia Anxiety Schizophrenia Alcohol abuse  Past Medical History:  Past Medical History:  Diagnosis Date   Anxiety    Asthma    Bipolar 1 disorder (HCC)    Chronic hepatitis C without hepatic coma (HCC) 05/28/2018   Depression    Hepatitis C    Hepatitis C antibody positive in blood 05/28/2018   Schizophrenia (HCC)    Sleep apnea     Past Surgical History:  Procedure Laterality Date   CYST REMOVAL NECK     neck    Family Psychiatric History:  Sister - schizophrenia  Family History:  Family History  Problem Relation Age of  Onset   Hypertension Sister    Schizophrenia Sister     Social History:  Social History   Socioeconomic History   Marital status: Single    Spouse name: Not on file   Number of children: Not on file   Years of education: Not on file   Highest education level: Not on file  Occupational History   Not on file  Tobacco Use   Smoking status: Some Days    Packs/day: 1.00    Types: Cigarettes   Smokeless tobacco: Never  Vaping Use   Vaping Use:  Never used  Substance and Sexual Activity   Alcohol use: Yes    Alcohol/week: 3.0 standard drinks    Types: 3 Shots of liquor per week   Drug use: Yes    Types: Cocaine, Marijuana    Comment: last used today   Sexual activity: Not Currently  Other Topics Concern   Not on file  Social History Narrative   Not on file   Social Determinants of Health   Financial Resource Strain: Not on file  Food Insecurity: Not on file  Transportation Needs: Not on file  Physical Activity: Not on file  Stress: Not on file  Social Connections: Not on file    Allergies: No Known Allergies  Metabolic Disorder Labs: Lab Results  Component Value Date   HGBA1C 5.6 07/16/2021   MPG 114 07/16/2021   MPG 105.41 04/08/2018   No results found for: PROLACTIN Lab Results  Component Value Date   CHOL 178 07/16/2021   TRIG 61 07/16/2021   HDL 68 07/16/2021   CHOLHDL 2.6 07/16/2021   VLDL 12 07/16/2021   LDLCALC 98 07/16/2021   LDLCALC 81 04/08/2018   Lab Results  Component Value Date   TSH 1.198 07/17/2021   TSH 2.617 04/08/2018    Therapeutic Level Labs: No results found for: LITHIUM No results found for: VALPROATE No components found for:  CBMZ  Current Medications: Current Outpatient Medications  Medication Sig Dispense Refill   [START ON 08/15/2021] ARIPiprazole ER (ABILIFY MAINTENA) 400 MG SRER injection Inject 2 mLs (400 mg total) into the muscle every 28 (twenty-eight) days. 1 each 3   ARIPiprazole (ABILIFY) 20 MG tablet Take 1 tablet (20 mg total) by mouth daily. 30 tablet 0   citalopram (CELEXA) 20 MG tablet Take 1 tablet (20 mg total) by mouth daily. 30 tablet 1   naproxen (NAPROSYN) 500 MG tablet Take 1 tablet (500 mg total) by mouth 2 (two) times daily with a meal. 60 tablet 0   thiamine 100 MG tablet Take 1 tablet (100 mg total) by mouth daily. 30 tablet 0   traZODone (DESYREL) 100 MG tablet Take 1 tablet (100 mg total) by mouth at bedtime. 30 tablet 1   No current  facility-administered medications for this visit.     Musculoskeletal: Strength & Muscle Tone: within normal limits Gait & Station: normal Patient leans: N/A  Psychiatric Specialty Exam: Review of Systems  Psychiatric/Behavioral:  Positive for sleep disturbance. Negative for decreased concentration, dysphoric mood, hallucinations, self-injury and suicidal ideas. The patient is not nervous/anxious and is not hyperactive.    Blood pressure 133/74, pulse 90, height 6\' 1"  (1.854 m), weight 205 lb (93 kg).Body mass index is 27.05 kg/m.  General Appearance: Fairly Groomed  Eye Contact:  Good  Speech:  Clear and Coherent and Normal Rate  Volume:  Normal  Mood:  Euthymic  Affect:  Appropriate  Thought Process:  Coherent, Goal Directed, and  Descriptions of Associations: Intact  Orientation:  Full (Time, Place, and Person)  Thought Content: WDL   Suicidal Thoughts:  No  Homicidal Thoughts:  No  Memory:  Immediate;   Good Recent;   Good Remote;   Fair  Judgement:  Fair  Insight:  Fair  Psychomotor Activity:  Normal  Concentration:  Concentration: Good and Attention Span: Good  Recall:  Good  Fund of Knowledge: Good  Language: Good  Akathisia:  NA  Handed:  Right  AIMS (if indicated): not done  Assets:  Communication Skills Desire for Improvement Housing Vocational/Educational  ADL's:  Intact  Cognition: WNL  Sleep:  Fair   Screenings: AIMS    Flowsheet Row Admission (Discharged) from 07/15/2019 in [facility] INPATIENT BEHAVIORAL MEDICINE Admission (Discharged) from 01/14/2019 in [facility] INPATIENT BEHAVIORAL MEDICINE  AIMS Total Score 0 0      AUDIT    Flowsheet Row Admission (Discharged) from 07/15/2021 in [facility] INPATIENT BEHAVIORAL MEDICINE Admission (Discharged) from 07/15/2019 in [facility] INPATIENT BEHAVIORAL MEDICINE Admission (Discharged) from 05/22/2019 in [facility] INPATIENT BEHAVIORAL MEDICINE Admission (Discharged) from 01/14/2019 in [facility] INPATIENT BEHAVIORAL MEDICINE Admission (Discharged)  from 04/09/2018 in [facility] INPATIENT BEHAVIORAL MEDICINE  Alcohol Use Disorder Identification Test Final Score (AUDIT) 28 9 8 9 17       GAD-7    Flowsheet Row Clinical Support from 07/27/2021 in [facility] Office Visit from 04/29/2021 in [facility]  Total GAD-7 Score 2 2      PHQ2-9    Flowsheet Row Clinical Support from 07/27/2021 in [facility] Office Visit from 04/29/2021 in [facility] Counselor from 04/26/2021 in [facility] Office Visit from 05/28/2018 in [facility] for Infectious Disease  PHQ-2 Total Score 0 0 1 0      Flowsheet Row Clinical Support from 07/27/2021 in [facility] ED from 07/21/2021 in [facility] Admission (Discharged) from 07/15/2021 in [facility] INPATIENT BEHAVIORAL MEDICINE  C-SSRS RISK CATEGORY High Risk No Risk Low Risk        Assessment and Plan:   Chesley D. OTTO [facility] is a 45 year old male with a past psychiatric history significant for schizophrenia, insomnia, anxiety, and alcohol abuse who presents to [facility] due to stress.  Patient was recently seen at [facility] for suicidal ideation and discharged on a variety of medications for the management of his depression, anxiety, and alcohol abuse.  Patient is requesting refills on his Abilify, trazodone, and citalopram.  Patient has no issues or concerns regarding his current medication regimen.  Patient is requesting a thiamine prescription and naproxen due to running out.  Patient was informed that these prescriptions were not normally prescribed but he was given a 30-day supply of each medication and given resources to follow up with a primary care provider.  Patient's medications to be e-prescribed to pharmacy of choice.  1. Insomnia due to other mental  disorder  - traZODone (DESYREL) 100 MG tablet; Take 1 tablet (100 mg total) by mouth at bedtime.  Dispense: 30 tablet; Refill: 1  2. Schizophrenia, chronic condition (HCC)  - ARIPiprazole (ABILIFY) 20 MG tablet; Take 1 tablet (20 mg total) by mouth daily.  Dispense: 30 tablet; Refill: 0  3. Anxiety state  - citalopram (CELEXA) 20 MG tablet; Take 1 tablet (20 mg total) by mouth daily.  Dispense: 30 tablet; Refill: 1  4. Alcohol abuse  - thiamine  100 MG tablet; Take 1 tablet (100 mg total) by mouth daily.  Dispense: 30 tablet; Refill: 0  5. Mild pain  - naproxen (NAPROSYN) 500 MG tablet; Take 1 tablet (500 mg total) by mouth 2 (two) times daily with a meal.  Dispense: 60 tablet; Refill: 0  Patient to follow up in 6 weeks Provider spent a total of 20 minutes with the patient/reviewing patient's chart  Meta Hatchet, PA 07/27/2021, 8:50 PM

## 2021-08-03 ENCOUNTER — Ambulatory Visit (INDEPENDENT_AMBULATORY_CARE_PROVIDER_SITE_OTHER): Payer: Medicaid Other | Admitting: Nurse Practitioner

## 2021-08-03 ENCOUNTER — Encounter: Payer: Self-pay | Admitting: Nurse Practitioner

## 2021-08-03 ENCOUNTER — Other Ambulatory Visit: Payer: Self-pay

## 2021-08-03 VITALS — BP 135/64 | HR 65 | Temp 98.1°F | Ht 76.0 in | Wt 214.0 lb

## 2021-08-03 DIAGNOSIS — G4709 Other insomnia: Secondary | ICD-10-CM

## 2021-08-03 DIAGNOSIS — Z0000 Encounter for general adult medical examination without abnormal findings: Secondary | ICD-10-CM

## 2021-08-03 DIAGNOSIS — K219 Gastro-esophageal reflux disease without esophagitis: Secondary | ICD-10-CM

## 2021-08-03 DIAGNOSIS — R112 Nausea with vomiting, unspecified: Secondary | ICD-10-CM

## 2021-08-03 DIAGNOSIS — Z8619 Personal history of other infectious and parasitic diseases: Secondary | ICD-10-CM

## 2021-08-03 LAB — POCT URINALYSIS DIP (CLINITEK)
Bilirubin, UA: NEGATIVE
Blood, UA: NEGATIVE
Glucose, UA: NEGATIVE mg/dL
Ketones, POC UA: NEGATIVE mg/dL
Leukocytes, UA: NEGATIVE
Nitrite, UA: NEGATIVE
POC PROTEIN,UA: NEGATIVE
Spec Grav, UA: 1.025 (ref 1.010–1.025)
Urobilinogen, UA: 4 E.U./dL — AB
pH, UA: 6 (ref 5.0–8.0)

## 2021-08-03 MED ORDER — VITAMIN B-6 50 MG PO TABS: 50.0000 mg | ORAL_TABLET | Freq: Every day | ORAL | 0 refills | Status: DC

## 2021-08-03 MED ORDER — OMEPRAZOLE 40 MG PO CPDR: 40.0000 mg | DELAYED_RELEASE_CAPSULE | Freq: Every day | ORAL | 3 refills | Status: DC | PRN

## 2021-08-03 MED ORDER — DIPHENHYDRAMINE HCL 50 MG PO TABS: 50.0000 mg | ORAL_TABLET | Freq: Every evening | ORAL | 0 refills | Status: DC | PRN

## 2021-08-03 NOTE — Progress Notes (Signed)
[facility] Patient [facility] [address] [place], [place]  [address] Phone:  [phone]   Fax:  [phone] Subjective:   Patient ID: Dominic [name], male    DOB: 12-Oct-1976, 45 y.o.   MRN: [id number]  Chief Complaint  Patient presents with   Establish Care    Treatment for Hep C, needing vitamin b6.    HPI Dominic [name] 45 y.o. male with history of anxiety, asthma, Bipolar 1 disorder, depression, schizophrenia and Hep C to the [facility] to establish care. Patient currently resides at the [facility], has been there for 10 days. Patient has been abstinent from alcohol, cocaine and weed x10 days.  Currently works in the Personal assistant.  Denies any complaints today.  Requesting refill of B6.  States that it was previously prescribed for nausea and vomiting which he has intermittently.  Also requesting management of hepatitis C.  Has had hepatitis C for several years transmitted through a tattoo needle.  States that it interferes with his vision at times.  Patient states that he began drinking alcohol 45 years old.  Prior to sobriety he was drinking a half a gallon of alcohol per day, 12 beers, weed and cocaine daily.  Was motivated to improve his life, after his children were taken away from him.  States that he also suffers from insomnia.  Has been on trazodone since 2011 and it is no longer effective.  States that he has difficulty falling asleep and staying asleep.  Often paces at night.  Typically sleeps 3 to 4 hours at night and is very tired during the day.Patient requesting prescription for benadryl, states that it has been effective in the past.  Denies any chest pain or shortness of breath.  Denies any numbness, tingling or acute changes in vision.  Denies any recent trauma or injury.  Denies any fever, headache or dizziness.  States that he monitors his diet and exercises daily.  Currently compliant with all medications.  Requesting medication for  heartburn, states that he is had problems with it in the past, but has never been prescribed medication.  Past Medical History:  Diagnosis Date   Anxiety    Asthma    Bipolar 1 disorder (HCC)    Chronic hepatitis C without hepatic coma (HCC) 05/28/2018   Depression    Hepatitis C    Hepatitis C antibody positive in blood 05/28/2018   Schizophrenia (HCC)    Sleep apnea     Past Surgical History:  Procedure Laterality Date   CYST REMOVAL NECK     neck    Family History  Problem Relation Age of Onset   Hypertension Sister    Schizophrenia Sister     Social History   Socioeconomic History   Marital status: Single    Spouse name: Not on file   Number of children: Not on file   Years of education: Not on file   Highest education level: Not on file  Occupational History   Not on file  Tobacco Use   Smoking status: Some Days    Packs/day: 1.00    Types: Cigarettes   Smokeless tobacco: Never  Vaping Use   Vaping Use: Never used  Substance and Sexual Activity   Alcohol use: Yes    Alcohol/week: 3.0 standard drinks    Types: 3 Shots of liquor per week   Drug use: Yes    Types: Cocaine, Marijuana    Comment: last used today  Sexual activity: Not Currently  Other Topics Concern   Not on file  Social History Narrative   Not on file   Social Determinants of Health   Financial Resource Strain: Not on file  Food Insecurity: Not on file  Transportation Needs: Not on file  Physical Activity: Not on file  Stress: Not on file  Social Connections: Not on file  Intimate Partner Violence: Not on file    Outpatient Medications Prior to Visit  Medication Sig Dispense Refill   ARIPiprazole (ABILIFY) 20 MG tablet Take 1 tablet (20 mg total) by mouth daily. 30 tablet 0   [START ON 08/15/2021] ARIPiprazole ER (ABILIFY MAINTENA) 400 MG SRER injection Inject 2 mLs (400 mg total) into the muscle every 28 (twenty-eight) days. 1 each 3   citalopram (CELEXA) 20 MG tablet Take 1  tablet (20 mg total) by mouth daily. 30 tablet 1   naproxen (NAPROSYN) 500 MG tablet Take 1 tablet (500 mg total) by mouth 2 (two) times daily with a meal. 60 tablet 0   thiamine 100 MG tablet Take 1 tablet (100 mg total) by mouth daily. 30 tablet 0   traZODone (DESYREL) 100 MG tablet Take 1 tablet (100 mg total) by mouth at bedtime. 30 tablet 1   No facility-administered medications prior to visit.    No Known Allergies  Review of Systems  Constitutional:  Negative for chills, fever and malaise/fatigue.  HENT: Negative.    Eyes:        Intermittent change sin vision due to Hep C  Respiratory:  Negative for cough and shortness of breath.   Cardiovascular:  Negative for chest pain, palpitations and leg swelling.  Gastrointestinal:  Positive for heartburn. Negative for abdominal pain, blood in stool, constipation, diarrhea, nausea and vomiting.  Genitourinary: Negative.   Musculoskeletal: Negative.   Skin: Negative.   Neurological: Negative.   Psychiatric/Behavioral:  Positive for substance abuse. Negative for depression and suicidal ideas. The patient has insomnia.   All other systems reviewed and are negative.     Objective:    Physical Exam Vitals reviewed.  Constitutional:      General: He is not in acute distress.    Appearance: Normal appearance. He is normal weight.  HENT:     Head: Normocephalic.     Right Ear: Tympanic membrane, ear canal and external ear normal.     Left Ear: Tympanic membrane, ear canal and external ear normal.     Nose: Nose normal.     Mouth/Throat:     Mouth: Mucous membranes are moist.  Eyes:     Extraocular Movements: Extraocular movements intact.     Conjunctiva/sclera: Conjunctivae normal.     Pupils: Pupils are equal, round, and reactive to light.  Cardiovascular:     Rate and Rhythm: Normal rate and regular rhythm.     Pulses: Normal pulses.     Heart sounds: Normal heart sounds.     Comments: No obvious peripheral edema Pulmonary:      Effort: Pulmonary effort is normal.     Breath sounds: Normal breath sounds.  Musculoskeletal:        General: Normal range of motion.     Cervical back: Normal range of motion.  Skin:    General: Skin is warm and dry.     Capillary Refill: Capillary refill takes less than 2 seconds.  Neurological:     General: No focal deficit present.     Mental Status: He is alert and oriented  to person, place, and time.  Psychiatric:        Mood and Affect: Mood normal.        Behavior: Behavior normal.        Thought Content: Thought content normal.        Judgment: Judgment normal.    BP 135/64 (BP Location: Left Arm, Patient Position: Sitting)   Pulse 65   Temp 98.1 F (36.7 C)   Ht 6\' 4"  (1.93 m)   Wt 214 lb 0.4 oz (97.1 kg)   SpO2 95%   BMI 26.05 kg/m  Wt Readings from Last 3 Encounters:  08/03/21 214 lb 0.4 oz (97.1 kg)  07/27/21 205 lb (93 kg)  07/21/21 220 lb (99.8 kg)    Immunization History  Administered Date(s) Administered   Influenza-Unspecified 09/27/2018   Tdap 05/19/2019, 06/16/2019    Diabetic Foot Exam - Simple   No data filed     Lab Results  Component Value Date   TSH 1.198 07/17/2021   Lab Results  Component Value Date   WBC 18.9 (H) 07/21/2021   HGB 13.4 07/21/2021   HCT 36.1 (L) 07/21/2021   MCV 80.4 07/21/2021   PLT 185 07/21/2021   Lab Results  Component Value Date   NA 135 07/21/2021   K 3.4 (L) 07/21/2021   CO2 24 07/21/2021   GLUCOSE 123 (H) 07/21/2021   BUN 15 07/21/2021   CREATININE 0.98 07/21/2021   BILITOT 1.5 (H) 07/21/2021   ALKPHOS 39 07/21/2021   AST 47 (H) 07/21/2021   ALT 37 07/21/2021   PROT 7.8 07/21/2021   ALBUMIN 4.0 07/21/2021   CALCIUM 8.7 (L) 07/21/2021   ANIONGAP 9 07/21/2021   Lab Results  Component Value Date   CHOL 178 07/16/2021   CHOL 150 04/08/2018   Lab Results  Component Value Date   HDL 68 07/16/2021   HDL 44 04/08/2018   Lab Results  Component Value Date   LDLCALC 98 07/16/2021    LDLCALC 81 04/08/2018   Lab Results  Component Value Date   TRIG 61 07/16/2021   TRIG 126 04/08/2018   Lab Results  Component Value Date   CHOLHDL 2.6 07/16/2021   CHOLHDL 3.4 04/08/2018   Lab Results  Component Value Date   HGBA1C 5.6 07/16/2021   HGBA1C 5.3 04/08/2018       Assessment & Plan:   Problem List Items Addressed This Visit   None Visit Diagnoses     Healthcare maintenance    -  Primary   Relevant Orders   POCT URINALYSIS DIP (CLINITEK) (Completed)   CBC with Differential/Platelet   Comprehensive metabolic panel   Lipid panel Encouraged continued diet and exercise efforts  Encouraged continued compliance with medication  Encouraged maintained sobriety  Encouraged maintain follow up with BH   History of hepatitis C       Relevant Orders   AMB referral to hepatitis C clinic   Non-intractable vomiting with nausea, unspecified vomiting type       Relevant Medications   pyridOXINE (VITAMIN B-6) 50 MG tablet   Gastroesophageal reflux disease, unspecified whether esophagitis present       Relevant Medications   omeprazole (PRILOSEC) 40 MG capsule   Other insomnia       Relevant Medications   diphenhydrAMINE (BENADRYL) 50 MG tablet Discussed sleep hygiene  Informed not to take concurrently with Trazodone    Follow up in 1 mth for reevaluation of symptoms addressed during today's visit, sooner as  needed    I am having Kieth D. Bolinger start on pyridOXINE, omeprazole, and diphenhydrAMINE. I am also having him maintain his ARIPiprazole ER, thiamine, traZODone, ARIPiprazole, citalopram, and naproxen.  Meds ordered this encounter  Medications   pyridOXINE (VITAMIN B-6) 50 MG tablet    Sig: Take 1 tablet (50 mg total) by mouth daily.    Dispense:  30 tablet    Refill:  0   omeprazole (PRILOSEC) 40 MG capsule    Sig: Take 1 capsule (40 mg total) by mouth daily as needed (acid reflux).    Dispense:  30 capsule    Refill:  3   diphenhydrAMINE (BENADRYL) 50  MG tablet    Sig: Take 1 tablet (50 mg total) by mouth at bedtime as needed for sleep.    Dispense:  30 tablet    Refill:  0     Kathrynn Speed, NP

## 2021-08-03 NOTE — Patient Instructions (Signed)
You were seen today in the [facility]CC to establish care. Labs were collected, results will be available via MyChart or, if abnormal, you will be contacted by clinic staff. You were prescribed medications, please take as directed. Please follow up in 1 mth for reevaluation.  

## 2021-08-04 ENCOUNTER — Other Ambulatory Visit: Payer: Self-pay

## 2021-08-04 ENCOUNTER — Emergency Department (HOSPITAL_COMMUNITY)
Admission: EM | Admit: 2021-08-04 | Discharge: 2021-08-04 | Disposition: A | Discharge status: Home / Self Care | Payer: Medicaid Other | Attending: Emergency Medicine | Admitting: Emergency Medicine

## 2021-08-04 ENCOUNTER — Encounter (HOSPITAL_COMMUNITY): Payer: Self-pay

## 2021-08-04 ENCOUNTER — Emergency Department (HOSPITAL_COMMUNITY): Payer: Medicaid Other

## 2021-08-04 DIAGNOSIS — Y990 Civilian activity done for income or pay: Secondary | ICD-10-CM | POA: Diagnosis not present

## 2021-08-04 DIAGNOSIS — W228XXA Striking against or struck by other objects, initial encounter: Secondary | ICD-10-CM | POA: Insufficient documentation

## 2021-08-04 DIAGNOSIS — Y9389 Activity, other specified: Secondary | ICD-10-CM | POA: Insufficient documentation

## 2021-08-04 DIAGNOSIS — Z23 Encounter for immunization: Secondary | ICD-10-CM | POA: Insufficient documentation

## 2021-08-04 DIAGNOSIS — S0990XA Unspecified injury of head, initial encounter: Secondary | ICD-10-CM | POA: Diagnosis present

## 2021-08-04 DIAGNOSIS — S0181XA Laceration without foreign body of other part of head, initial encounter: Secondary | ICD-10-CM | POA: Insufficient documentation

## 2021-08-04 DIAGNOSIS — S060X0A Concussion without loss of consciousness, initial encounter: Secondary | ICD-10-CM | POA: Diagnosis not present

## 2021-08-04 DIAGNOSIS — M542 Cervicalgia: Secondary | ICD-10-CM | POA: Diagnosis not present

## 2021-08-04 DIAGNOSIS — F17210 Nicotine dependence, cigarettes, uncomplicated: Secondary | ICD-10-CM | POA: Insufficient documentation

## 2021-08-04 DIAGNOSIS — Z8616 Personal history of COVID-19: Secondary | ICD-10-CM | POA: Diagnosis not present

## 2021-08-04 DIAGNOSIS — J45909 Unspecified asthma, uncomplicated: Secondary | ICD-10-CM | POA: Insufficient documentation

## 2021-08-04 DIAGNOSIS — Y9289 Other specified places as the place of occurrence of the external cause: Secondary | ICD-10-CM | POA: Diagnosis not present

## 2021-08-04 LAB — CBC WITH DIFFERENTIAL/PLATELET
Basophils Absolute: 0 10*3/uL (ref 0.0–0.2)
Basos: 0 %
EOS (ABSOLUTE): 0.1 10*3/uL (ref 0.0–0.4)
Eos: 1 %
Hematocrit: 40.1 % (ref 37.5–51.0)
Hemoglobin: 13.8 g/dL (ref 13.0–17.7)
Immature Grans (Abs): 0 10*3/uL (ref 0.0–0.1)
Immature Granulocytes: 0 %
Lymphocytes Absolute: 3.3 10*3/uL — ABNORMAL HIGH (ref 0.7–3.1)
Lymphs: 37 %
MCH: 30 pg (ref 26.6–33.0)
MCHC: 34.4 g/dL (ref 31.5–35.7)
MCV: 87 fL (ref 79–97)
Monocytes Absolute: 0.6 10*3/uL (ref 0.1–0.9)
Monocytes: 6 %
Neutrophils Absolute: 5 10*3/uL (ref 1.4–7.0)
Neutrophils: 56 %
Platelets: 227 10*3/uL (ref 150–450)
RBC: 4.6 x10E6/uL (ref 4.14–5.80)
RDW: 14.6 % (ref 11.6–15.4)
WBC: 9.1 10*3/uL (ref 3.4–10.8)

## 2021-08-04 LAB — COMPREHENSIVE METABOLIC PANEL
ALT: 43 IU/L (ref 0–44)
AST: 41 IU/L — ABNORMAL HIGH (ref 0–40)
Albumin/Globulin Ratio: 1.5 (ref 1.2–2.2)
Albumin: 4.1 g/dL (ref 4.0–5.0)
Alkaline Phosphatase: 47 IU/L (ref 44–121)
BUN/Creatinine Ratio: 17 (ref 9–20)
BUN: 13 mg/dL (ref 6–24)
Bilirubin Total: <0.2 mg/dL (ref 0.0–1.2)
CO2: 25 mmol/L (ref 20–29)
Calcium: 8.8 mg/dL (ref 8.7–10.2)
Chloride: 103 mmol/L (ref 96–106)
Creatinine, Ser: 0.78 mg/dL (ref 0.76–1.27)
Globulin, Total: 2.7 g/dL (ref 1.5–4.5)
Glucose: 100 mg/dL — ABNORMAL HIGH (ref 65–99)
Potassium: 4.5 mmol/L (ref 3.5–5.2)
Sodium: 139 mmol/L (ref 134–144)
Total Protein: 6.8 g/dL (ref 6.0–8.5)
eGFR: 113 mL/min/{1.73_m2} (ref >59)

## 2021-08-04 LAB — LIPID PANEL
Chol/HDL Ratio: 2.8 ratio (ref 0.0–5.0)
Cholesterol, Total: 194 mg/dL (ref 100–199)
HDL: 69 mg/dL (ref >39)
LDL Chol Calc (NIH): 107 mg/dL — ABNORMAL HIGH (ref 0–99)
Triglycerides: 99 mg/dL (ref 0–149)
VLDL Cholesterol Cal: 18 mg/dL (ref 5–40)

## 2021-08-04 MED ORDER — ARIPIPRAZOLE 10 MG PO TABS: 20.0000 mg | ORAL_TABLET | Freq: Every day | ORAL | Status: DC

## 2021-08-04 MED ORDER — ARIPIPRAZOLE 10 MG PO TABS
20.0000 mg | ORAL_TABLET | Freq: Once | ORAL | Status: AC
  Administered 2021-08-04: 20 mg via ORAL
  Filled 2021-08-04: qty 2

## 2021-08-04 MED ORDER — TETANUS-DIPHTH-ACELL PERTUSSIS 5-2.5-18.5 LF-MCG/0.5 IM SUSY
0.5000 mL | PREFILLED_SYRINGE | Freq: Once | INTRAMUSCULAR | Status: AC
  Administered 2021-08-04: 0.5 mL via INTRAMUSCULAR
  Filled 2021-08-04: qty 0.5

## 2021-08-04 MED ORDER — ACETAMINOPHEN 500 MG PO TABS
1000.0000 mg | ORAL_TABLET | Freq: Once | ORAL | Status: AC
  Administered 2021-08-04: 1000 mg via ORAL
  Filled 2021-08-04: qty 2
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
## 2021-08-04 NOTE — ED Provider Notes (Signed)
[facility] EMERGENCY DEPARTMENT Provider Note   CSN: [id number] Arrival date & time: 08/04/21  2027     History Chief Complaint  Patient presents with   Head Injury    Dominic [name] is a 45 y.o. male with PMHx schizophrenia, polysubstance abuse, chronic hepatitis C who presents for evaluation of head injury.   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  He endorses seeing stars and feeling lightheaded afterwards.  He then began to experience headache and neck pain.  He denies syncope, numbness, weakness, or coordination changes.  He denies any other traumatic injuries.  He subsequently presented to our emergency department for further evaluation.     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   Schizophrenia, chronic condition (HCC) 01/14/2019   Alcohol intoxication, uncomplicated (HCC) 01/13/2019   Asthma 11/25/2018   Nicotine dependence, cigarettes, uncomplicated 11/25/2018   Chronic hepatitis C without hepatic coma (HCC) 05/28/2018   Alcohol abuse 02/16/2016   Cocaine abuse (HCC) 02/16/2016    Past Surgical History:  Procedure Laterality Date   CYST REMOVAL NECK     neck       Family History  Problem Relation Age of Onset   Hypertension Sister    Schizophrenia Sister     Social History   Tobacco Use   Smoking status: Some Days     Packs/day: 1.00    Types: Cigarettes   Smokeless tobacco: Never  Vaping Use   Vaping Use: Never used  Substance Use Topics   Alcohol use: Yes    Alcohol/week: 3.0 standard drinks    Types: 3 Shots of liquor per week   Drug use: Yes    Types: Cocaine, Marijuana    Comment: last used today    Home Medications Prior to Admission medications   Medication Sig Start Date End Date Taking? Authorizing Provider  ARIPiprazole (ABILIFY) 20 MG tablet Take 1 tablet (20 mg total) by mouth daily. 07/27/21   Nwoko, Tommas Olp, PA  ARIPiprazole ER (ABILIFY MAINTENA) 400 MG SRER injection Inject 2 mLs (400 mg total) into the muscle every 28 (twenty-eight) days. 08/15/21   Jesse Sans, MD  citalopram (CELEXA) 20 MG tablet Take 1 tablet (20 mg total) by mouth daily. 07/27/21   Nwoko, Tommas Olp, PA  diphenhydrAMINE (BENADRYL) 50 MG tablet Take 1 tablet (50 mg total) by mouth at bedtime as needed for sleep. 08/03/21   Orion Crook I, NP  naproxen (NAPROSYN) 500 MG tablet Take 1 tablet (500 mg total) by mouth 2 (two) times daily with a meal. 07/27/21   Nwoko, Tommas Olp, PA  omeprazole (PRILOSEC) 40 MG capsule Take 1 capsule (40 mg total) by mouth daily as needed (acid reflux). 08/03/21   Orion Crook I, NP  pyridOXINE (VITAMIN  B-6) 50 MG tablet Take 1 tablet (50 mg total) by mouth daily. 08/03/21   Orion Crook I, NP  thiamine 100 MG tablet Take 1 tablet (100 mg total) by mouth daily. 07/27/21   Nwoko, Tommas Olp, PA  traZODone (DESYREL) 100 MG tablet Take 1 tablet (100 mg total) by mouth at bedtime. 07/27/21   Meta Hatchet, PA    Allergies    Patient has no known allergies.  Review of Systems   Review of Systems  Constitutional:  Negative for chills and fever.  HENT:  Negative for ear pain and sore throat.   Eyes:  Negative for pain and visual disturbance.  Respiratory:  Negative for cough and shortness of breath.   Cardiovascular:  Negative for chest pain and palpitations.   Gastrointestinal:  Negative for abdominal pain and vomiting.  Genitourinary:  Negative for dysuria and hematuria.  Musculoskeletal:  Positive for neck pain. Negative for arthralgias and back pain.  Skin:  Negative for color change and rash.  Neurological:  Positive for headaches. Negative for seizures and syncope.  All other systems reviewed and are negative.  Physical Exam Updated Vital Signs BP (!) 149/96 (BP Location: Right Arm)   Pulse 84   Temp 98.5 F (36.9 C) (Oral)   Resp 15   Ht 6' 3.98" (1.93 m)   Wt 97 kg   SpO2 97%   BMI 26.04 kg/m   Physical Exam Vitals and nursing note reviewed.  Constitutional:      Appearance: He is well-developed.  HENT:     Head: Normocephalic and atraumatic.     Comments: Small 0.25 cm linear laceration on the forehead.  No palpable skull fracture. Eyes:     Conjunctiva/sclera: Conjunctivae normal.  Neck:     Comments: Patient endorses midline cervical spine tenderness to palpation.  No step-offs or deformity. Cardiovascular:     Rate and Rhythm: Normal rate and regular rhythm.     Heart sounds: No murmur heard. Pulmonary:     Effort: Pulmonary effort is normal. No respiratory distress.     Breath sounds: Normal breath sounds.     Comments: Lungs clear to auscultation bilaterally.  Chest able to AP and lateral compression. Abdominal:     Palpations: Abdomen is soft.     Tenderness: There is no abdominal tenderness.     Comments: Abdomen soft, nontender, nondistended.  Musculoskeletal:     Cervical back: Neck supple. Tenderness present.  Skin:    General: Skin is warm and dry.     Capillary Refill: Capillary refill takes less than 2 seconds.  Neurological:     General: No focal deficit present.     Mental Status: He is alert and oriented to person, place, and time. Mental status is at baseline.     Cranial Nerves: No cranial nerve deficit.     Sensory: No sensory deficit.     Motor: No weakness.     Coordination: Coordination  normal.     Comments: No focal neurologic deficits.    ED Results / Procedures / Treatments   Labs (all labs ordered are listed, but only abnormal results are displayed) Labs Reviewed - No data to display  EKG None  Radiology CT HEAD WO CONTRAST ( )  Result Date: 08/04/2021 CLINICAL DATA:  Head trauma, moderate to severe EXAM: CT HEAD WITHOUT CONTRAST TECHNIQUE: Contiguous axial images were obtained from the base of the skull through the vertex without intravenous contrast. COMPARISON:  05/28/2020 CT head, 06/16/2019 CT  head and cervical spine FINDINGS: Brain: No evidence of acute infarction, hemorrhage, cerebral edema, mass, mass effect, or midline shift. Ventricles and sulci are normal for age. No extra-axial fluid collection. Vascular: No hyperdense vessel or unexpected calcification. Skull: Normal. Negative for fracture or focal lesion. Sinuses/Orbits: Mucous retention cyst in the right maxillary sinus and right sphenoid sinus. Orbits are unremarkable. Other: The mastoid air cells are well aerated. IMPRESSION: No acute intracranial process. Electronically Signed   By: Wiliam Ke M.D.   On: 08/04/2021 21:45   CT Cervical Spine Wo Contrast  Result Date: 08/04/2021 CLINICAL DATA:  Neck trauma EXAM: CT CERVICAL SPINE WITHOUT CONTRAST TECHNIQUE: Multidetector CT imaging of the cervical spine was performed without intravenous contrast. Multiplanar CT image reconstructions were also generated. COMPARISON:  CT cervical spine 06/16/2019 FINDINGS: Alignment: Mild straightening of the normal cervical lordosis, which may be positional. No listhesis. Skull base and vertebrae: No acute fracture. No primary bone lesion or focal pathologic process. Soft tissues and spinal canal: No prevertebral fluid or swelling. No visible canal hematoma. Disc levels: No high-grade spinal canal stenosis or neural foraminal narrowing. Upper chest: Apical blebs in the right lung. No focal pulmonary opacity. Other: None.  IMPRESSION: No acute fracture or static listhesis in the cervical spine. Electronically Signed   By: Wiliam Ke M.D.   On: 08/04/2021 21:47    Procedures Procedures   Medications Ordered in ED Medications  acetaminophen (TYLENOL) tablet 1,000 mg (1,000 mg Oral Given 08/04/21 2047)  ARIPiprazole (ABILIFY) tablet 20 mg (20 mg Oral Given 08/04/21 2301)  Tdap (BOOSTRIX) injection 0.5 mL (0.5 mLs Intramuscular Given 08/04/21 2301)    ED Course  I have reviewed the triage vital signs and the nursing notes.  Pertinent labs & imaging results that were available during my care of the patient were reviewed by me and considered in my medical decision making (see chart for details).    MDM Rules/Calculators/A&P                           45 y.o. male with past medical history as above who presents for evaluation of head injury. Afebrile and hemodynamically stable.  Exam as detailed above.  No focal neurologic deficits.  Focused trauma scans were obtained, with CT head and cervical spine demonstrating no acute traumatic injuries.  I have low suspicion for intrathoracic or intra-abdominal pathology requiring further imaging at this time.  Forehead laceration is very small and well approximated, no indication for primary repair sutures at this time.  Patient was given Tdap booster.  Patient will follow-up closely with PCP.  Final Clinical Impression(s) / ED Diagnoses Final diagnoses:  Injury of head, initial encounter  Concussion without loss of consciousness, initial encounter    Rx / DC Orders ED Discharge Orders     None        Holley [name], MD 08/05/21 0059    Virgina Norfolk, DO 08/05/21 1637

## 2021-08-04 NOTE — ED Triage Notes (Signed)
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

## 2021-08-04 NOTE — ED Notes (Signed)
[name] [phone] wants to talk to her brother

## 2021-08-05 ENCOUNTER — Emergency Department (HOSPITAL_COMMUNITY)
Admission: EM | Admit: 2021-08-05 | Discharge: 2021-08-05 | Disposition: A | Discharge status: Home / Self Care | Payer: Medicaid Other | Attending: Emergency Medicine | Admitting: Emergency Medicine

## 2021-08-05 ENCOUNTER — Encounter (HOSPITAL_COMMUNITY): Payer: Self-pay

## 2021-08-05 DIAGNOSIS — F440 Dissociative amnesia: Secondary | ICD-10-CM | POA: Diagnosis not present

## 2021-08-05 DIAGNOSIS — R440 Auditory hallucinations: Secondary | ICD-10-CM | POA: Diagnosis not present

## 2021-08-05 DIAGNOSIS — R45851 Suicidal ideations: Secondary | ICD-10-CM | POA: Diagnosis not present

## 2021-08-05 DIAGNOSIS — Z20822 Contact with and (suspected) exposure to covid-19: Secondary | ICD-10-CM | POA: Insufficient documentation

## 2021-08-05 DIAGNOSIS — F17210 Nicotine dependence, cigarettes, uncomplicated: Secondary | ICD-10-CM | POA: Insufficient documentation

## 2021-08-05 DIAGNOSIS — Y900 Blood alcohol level of less than 20 mg/100 ml: Secondary | ICD-10-CM | POA: Insufficient documentation

## 2021-08-05 DIAGNOSIS — F209 Schizophrenia, unspecified: Secondary | ICD-10-CM | POA: Diagnosis present

## 2021-08-05 DIAGNOSIS — R443 Hallucinations, unspecified: Secondary | ICD-10-CM

## 2021-08-05 DIAGNOSIS — F29 Unspecified psychosis not due to a substance or known physiological condition: Secondary | ICD-10-CM

## 2021-08-05 LAB — RAPID URINE DRUG SCREEN, HOSP PERFORMED
Amphetamines: NOT DETECTED
Barbiturates: NOT DETECTED
Benzodiazepines: NOT DETECTED
Cocaine: NOT DETECTED
Opiates: NOT DETECTED
Tetrahydrocannabinol: POSITIVE — AB

## 2021-08-05 LAB — CBC WITH DIFFERENTIAL/PLATELET
Abs Immature Granulocytes: 0.03 10*3/uL (ref 0.00–0.07)
Basophils Absolute: 0 10*3/uL (ref 0.0–0.1)
Basophils Relative: 0 %
Eosinophils Absolute: 0.1 10*3/uL (ref 0.0–0.5)
Eosinophils Relative: 1 %
HCT: 38.8 % — ABNORMAL LOW (ref 39.0–52.0)
Hemoglobin: 13.7 g/dL (ref 13.0–17.0)
Immature Granulocytes: 0 %
Lymphocytes Relative: 41 %
Lymphs Abs: 4.2 10*3/uL — ABNORMAL HIGH (ref 0.7–4.0)
MCH: 29.6 pg (ref 26.0–34.0)
MCHC: 35.3 g/dL (ref 30.0–36.0)
MCV: 83.8 fL (ref 80.0–100.0)
Monocytes Absolute: 0.7 10*3/uL (ref 0.1–1.0)
Monocytes Relative: 7 %
Neutro Abs: 5.1 10*3/uL (ref 1.7–7.7)
Neutrophils Relative %: 51 %
Platelets: 219 10*3/uL (ref 150–400)
RBC: 4.63 MIL/uL (ref 4.22–5.81)
RDW: 14.3 % (ref 11.5–15.5)
WBC: 10.1 10*3/uL (ref 4.0–10.5)
nRBC: 0 % (ref 0.0–0.2)

## 2021-08-05 LAB — COMPREHENSIVE METABOLIC PANEL
ALT: 45 U/L — ABNORMAL HIGH (ref 0–44)
AST: 40 U/L (ref 15–41)
Albumin: 3.9 g/dL (ref 3.5–5.0)
Alkaline Phosphatase: 41 U/L (ref 38–126)
Anion gap: 11 (ref 5–15)
BUN: 15 mg/dL (ref 6–20)
CO2: 23 mmol/L (ref 22–32)
Calcium: 8.6 mg/dL — ABNORMAL LOW (ref 8.9–10.3)
Chloride: 102 mmol/L (ref 98–111)
Creatinine, Ser: 0.68 mg/dL (ref 0.61–1.24)
GFR, Estimated: >60 mL/min (ref >60)
Glucose, Bld: 97 mg/dL (ref 70–99)
Potassium: 3.9 mmol/L (ref 3.5–5.1)
Sodium: 136 mmol/L (ref 135–145)
Total Bilirubin: 0.5 mg/dL (ref 0.3–1.2)
Total Protein: 7.5 g/dL (ref 6.5–8.1)

## 2021-08-05 LAB — URINALYSIS, ROUTINE W REFLEX MICROSCOPIC
Bilirubin Urine: NEGATIVE
Glucose, UA: NEGATIVE mg/dL
Hgb urine dipstick: NEGATIVE
Ketones, ur: NEGATIVE mg/dL
Nitrite: NEGATIVE
Protein, ur: NEGATIVE mg/dL
Specific Gravity, Urine: 1.02 (ref 1.005–1.030)
pH: 7 (ref 5.0–8.0)

## 2021-08-05 LAB — RESP PANEL BY RT-PCR (FLU A&B, COVID) ARPGX2
Influenza A by PCR: NEGATIVE
Influenza B by PCR: NEGATIVE
SARS Coronavirus 2 by RT PCR: NEGATIVE

## 2021-08-05 LAB — MAGNESIUM: Magnesium: 2 mg/dL (ref 1.7–2.4)

## 2021-08-05 LAB — ACETAMINOPHEN LEVEL: Acetaminophen (Tylenol), Serum: <10 ug/mL — ABNORMAL LOW (ref 10–30)

## 2021-08-05 LAB — SALICYLATE LEVEL: Salicylate Lvl: <7 mg/dL — ABNORMAL LOW (ref 7.0–30.0)

## 2021-08-05 LAB — ETHANOL: Alcohol, Ethyl (B): <10 mg/dL (ref <10)

## 2021-08-05 MED ORDER — OLANZAPINE 10 MG PO TBDP
10.0000 mg | ORAL_TABLET | Freq: Every day | ORAL | Status: DC
  Administered 2021-08-05: 10 mg via ORAL
  Filled 2021-08-05: qty 1

## 2021-08-05 MED ORDER — ARIPIPRAZOLE 10 MG PO TABS: 20.0000 mg | ORAL_TABLET | Freq: Every day | ORAL | Status: DC

## 2021-08-05 NOTE — ED Notes (Signed)
Patient back for his belongings. 2 bags of belongings returned

## 2021-08-05 NOTE — ED Notes (Signed)
Patient provided phone for use

## 2021-08-05 NOTE — BH Assessment (Signed)
Patient is a 45 year old male that presents this date after "hitting a pole with his head." Patient had voiced AH after that incident although denies at the time of assessment. Patient denies any S/I, H/I or AVH and is requesting to be discharged. Patient has a history of Schizophrenia and receives medication management from [facility] who assists with ongoing care. Patient reports current medication compliance and denies any mental health symptoms this date. Patient is currently residing at [facility] and will return there on discharge. Patient is requesting assistance with ongoing SA issues stating he would like some "long term care" and will be provided with resources on discharge so he can follow up. Patient's UDS is positive this date for [facility] although reports he has been using cocaine "when he can get it." Per Leevy-Johnson NP patient will be discharged this date and encouraged to follow up with resources provided.

## 2021-08-05 NOTE — BH Assessment (Signed)
[facility] Assessment Progress Note   Per Maxie Barb, NP, this voluntary pt will not require psychiatric hospitalization and will be psychiatrically cleared, provided that wrap-around services can be put I place for him.  After making several calls this writer reached Nira Retort [phone]), the [facility] Liaison working with this pt.  She reports that she will submit pt for Care Coordination services, and she agrees to speak to pt by phone right now.  After speaking to pt she calls me back, informing me that she obtained a better phone number for reaching pt [phone]) which I have forwarded to Registration.  Pt has reportedly agreed to have her contact him with Care Coordination information.  Pt has two previously scheduled appointments at [facility] on Tuesday, 08/16/2021 at 10:30 and on Thursday 09/08/2021 at 11:00.  These have been included in pt's discharge instructions along with Delilah's contact information.  Pt has independently reached out to [facility].  He reports that they have agreed to have someone come to [facility] to pick him up.  These details have been staffed with Nehemiah Settle who has psych cleared pt.  EDP Linwood Dibbles, MD and pt's nurse, Morrie Sheldon, have been notified.  Doylene Canning, MA Triage Specialist [phone]

## 2021-08-05 NOTE — Discharge Instructions (Signed)
For your behavioral health needs you are advised to follow up with [facility].  You have appointments scheduled Tuesday, August 16, 2021 at 10:30 am and for Thursday, September 08, 2021 at 11:00 am.       [facility]      [address]      [place], [place] [address]      [phone]  Be sure to stay in touch with Nira Retort, your hospital liaison at the [facility], for other treatment options:       Nira Retort      [facility]      [phone]

## 2021-08-05 NOTE — Consult Note (Signed)
[facility] Psych ED Discharge  08/05/2021 2:29 PM Dominic [name]  MRN:  [id number]  Method of visit?: Face to Face   Principal Problem: Auditory hallucination Discharge Diagnoses: Principal Problem:   Auditory hallucination Active Problems:   Schizophrenia, chronic condition (HCC)   Subjective:  Patient states he's been having "high anxiety" when he thinks about his kids that were taken from him. Says he is currently living in the [facility] and last night had "high anxiety". He endorses history of auditory hallucinations "since I was 45 years old". He is able to verbalize his diagnosis of schizophrenia says he "gets a shot" in a few weeks.   Per chart review patient is last seen at [facility] outpatient clinic 07/27/21 for medication management; to receive Abilify injection beginning in October. States he currently lives at the [facility] but is from [place], [place]. He denying any active suicidal or homicidal ideations, auditory or visual hallucinations, and does not appear to be attending to any external/internal stimuli at this time.   Patient has multiple supports in place including [facility] care coordination with pending ACTT referral, [facility], [facility] outpatient  who assist in managing his care. Patient has verbalized interest in maintaining care and "getting better" for the sake of his children and denies any safety concerns at this time.   Total Time spent with patient: 15 minutes  Past Psychiatric History:   -schizophrenia  Past Medical History:  Past Medical History:  Diagnosis Date   Anxiety    Asthma    Bipolar 1 disorder (HCC)    Chronic hepatitis C without hepatic coma (HCC) 05/28/2018   Depression    Hepatitis C    Hepatitis C antibody positive in blood 05/28/2018   Schizophrenia (HCC)    Sleep apnea     Past Surgical History:  Procedure Laterality Date   CYST REMOVAL NECK     neck   Family History:  Family History  Problem Relation Age of Onset    Hypertension Sister    Schizophrenia Sister    Family Psychiatric  History: not noted Social History:  Social History   Substance and Sexual Activity  Alcohol Use Yes   Alcohol/week: 3.0 standard drinks   Types: 3 Shots of liquor per week     Social History   Substance and Sexual Activity  Drug Use Yes   Types: Cocaine, Marijuana   Comment: last used today    Social History   Socioeconomic History   Marital status: Single    Spouse name: Not on file   Number of children: Not on file   Years of education: Not on file   Highest education level: Not on file  Occupational History   Not on file  Tobacco Use   Smoking status: Some Days    Packs/day: 1.00    Types: Cigarettes   Smokeless tobacco: Never  Vaping Use   Vaping Use: Never used  Substance and Sexual Activity   Alcohol use: Yes    Alcohol/week: 3.0 standard drinks    Types: 3 Shots of liquor per week   Drug use: Yes    Types: Cocaine, Marijuana    Comment: last used today   Sexual activity: Not Currently  Other Topics Concern   Not on file  Social History Narrative   Not on file   Social Determinants of Health   Financial Resource Strain: Not on file  Food Insecurity: Not on file  Transportation Needs: Not on file  Physical Activity: Not  on file  Stress: Not on file  Social Connections: Not on file    Tobacco Cessation:  Prescription not provided because: patient declined  Current Medications: Current Facility-Administered Medications  Medication Dose Route Frequency Provider Last Rate Last Admin   [START ON 08/06/2021] ARIPiprazole (ABILIFY) tablet 20 mg  20 mg Oral Daily Leevy-Johnson, Ahsan Esterline A, NP       Current Outpatient Medications  Medication Sig Dispense Refill   ARIPiprazole (ABILIFY) 20 MG tablet Take 1 tablet (20 mg total) by mouth daily. 30 tablet 0   [START ON 08/15/2021] ARIPiprazole ER (ABILIFY MAINTENA) 400 MG SRER injection Inject 2 mLs (400 mg total) into the muscle every 28  (twenty-eight) days. 1 each 3   citalopram (CELEXA) 20 MG tablet Take 1 tablet (20 mg total) by mouth daily. 30 tablet 1   diphenhydrAMINE (BENADRYL) 50 MG tablet Take 1 tablet (50 mg total) by mouth at bedtime as needed for sleep. 30 tablet 0   naproxen (NAPROSYN) 500 MG tablet Take 1 tablet (500 mg total) by mouth 2 (two) times daily with a meal. 60 tablet 0   omeprazole (PRILOSEC) 40 MG capsule Take 1 capsule (40 mg total) by mouth daily as needed (acid reflux). 30 capsule 3   pyridOXINE (VITAMIN B-6) 50 MG tablet Take 1 tablet (50 mg total) by mouth daily. 30 tablet 0   thiamine 100 MG tablet Take 1 tablet (100 mg total) by mouth daily. 30 tablet 0   traZODone (DESYREL) 100 MG tablet Take 1 tablet (100 mg total) by mouth at bedtime. 30 tablet 1   PTA Medications: (Not in a hospital admission)   Musculoskeletal: Strength & Muscle Tone: within normal limits Gait & Station: normal Patient leans: N/A  Psychiatric Specialty Exam:  Presentation  General Appearance: Disheveled; Casual  Eye Contact:Fair  Speech:Clear and Coherent  Speech Volume:Normal  Handedness:Right   Mood and Affect  Mood:Euthymic  Affect:Congruent   Thought Process  Thought Processes:Coherent  Descriptions of Associations:Intact  Orientation:Full (Time, Place and Person)  Thought Content:Logical  History of Schizophrenia/Schizoaffective disorder:Yes  Duration of Psychotic Symptoms:Greater than six months  Hallucinations:Hallucinations: None  Ideas of Reference:None  Suicidal Thoughts:Suicidal Thoughts: No  Homicidal Thoughts:Homicidal Thoughts: No   Sensorium  Memory:Immediate Fair; Recent Fair; Remote Fair  Judgment:Fair  Insight:Fair   Executive Functions  Concentration:Fair  Attention Span:Fair  Recall:Fair  Fund of Knowledge:Fair  Language:Fair   Psychomotor Activity  Psychomotor Activity:Psychomotor Activity: Normal   Assets  Assets:Desire for Improvement;  Manufacturing systems engineer; Financial Resources/Insurance; Housing; Physical Health; Resilience; Social Support   Sleep  Sleep:Sleep: Good    Physical Exam: Physical Exam Vitals and nursing note reviewed.  Constitutional:      General: He is not in acute distress.    Appearance: Normal appearance. He is normal weight. He is not ill-appearing.  HENT:     Head: Normocephalic.     Nose: Nose normal.     Mouth/Throat:     Mouth: Mucous membranes are moist.     Pharynx: Oropharynx is clear.  Eyes:     Pupils: Pupils are equal, round, and reactive to light.  Cardiovascular:     Rate and Rhythm: Normal rate.     Pulses: Normal pulses.  Pulmonary:     Effort: Pulmonary effort is normal.  Musculoskeletal:        General: Normal range of motion.     Cervical back: Normal range of motion.  Skin:    General: Skin is warm and dry.  Neurological:     Mental Status: He is alert. Mental status is at baseline.  Psychiatric:        Attention and Perception: Attention normal. He does not perceive visual hallucinations.        Mood and Affect: Mood normal. Affect is flat.        Speech: Speech normal.        Behavior: Behavior is cooperative.        Thought Content: Thought content is not paranoid or delusional. Thought content does not include homicidal or suicidal ideation. Thought content does not include homicidal or suicidal plan.        Cognition and Memory: Memory normal.        Judgment: Judgment is not impulsive.   Review of Systems  Psychiatric/Behavioral:  Negative for hallucinations and suicidal ideas.   All other systems reviewed and are negative. Blood pressure 119/71, pulse (!) 50, temperature 98.1 F (36.7 C), temperature source Oral, resp. rate 20, SpO2 97 %. There is no height or weight on file to calculate BMI.   Demographic Factors:  Male and Low socioeconomic status  Loss Factors: Financial problems/change in socioeconomic status  Historical Factors: Family history  of mental illness or substance abuse  Risk Reduction Factors:   Sense of responsibility to family and Religious beliefs about death  Continued Clinical Symptoms:  Alcohol/Substance Abuse/Dependencies Schizophrenia:   Paranoid or undifferentiated type  Cognitive Features That Contribute To Risk:  None    Suicide Risk:  Mild:  Suicidal ideation of limited frequency, intensity, duration, and specificity.  There are no identifiable plans, no associated intent, mild dysphoria and related symptoms, good self-control (both objective and subjective assessment), few other risk factors, and identifiable protective factors, including available and accessible social support.  Plan Of Care/Follow-up recommendations:  Other:  Patient to continue to work with [facility] coordinator; ACTT referral made and in process. Keep appointments with [facility] outpatient. Return to [facility] for continued outpatient support.   Disposition: Discharge patient home to [name] for individual follow up with existing providers.  Loletta Parish, NP 08/05/2021, 2:29 PM

## 2021-08-05 NOTE — ED Triage Notes (Signed)
Pt BIB EMS. Pt states that he has been hearing voices since he was 45 years old.

## 2021-08-05 NOTE — ED Notes (Addendum)
Patient was informed that he would be d/c, pt stated that he was tired of waiting and wanted to leave. Staff told patient that his d/c order was in and it wouldn't be long before he could leave, but pt told staff "I don't care, I'm out of here" and left the department. Pt's safety sitter (Margarette NT) attempted to give pt his belongings, however pt refused and walked out of the department. Morrie Sheldon RN aware, staff will hold pt's belongings in order to attempt to return them to pt.

## 2021-08-05 NOTE — ED Notes (Signed)
Two bags in 23-25/hall C cabinet

## 2021-08-05 NOTE — ED Notes (Signed)
Patient lays in bed and then gets back up and walks back and forth down hallway and then gets back in bed.

## 2021-08-05 NOTE — ED Provider Notes (Addendum)
[facility]-EMERGENCY DEPT Provider Note   CSN: [id number] Arrival date & time: 08/05/21  0128     History Chief Complaint  Patient presents with   hearing voices   Suicidal    Dominic [name] is a 44 y.o. male.  HPI  Patient has a history of schizophrenia and bipolar disorder according to the medical records.  Patient states last night he was feeling depressed and suicidal.  He was going to walk out in the middle of traffic.  Patient states he was hearing voices but they told him to not walk into traffic.  He admits to substance abuse problems.  Patient states he wants to get better.  He has daughters that he would like to see but has not been able to see them until he can maintain sobriety.  He denies any thoughts of harming himself right now.  He states he is feeling better than he was when he first came in last night.  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   Schizophrenia, chronic condition (HCC) 01/14/2019   Alcohol intoxication, uncomplicated (HCC) 01/13/2019   Asthma 11/25/2018   Nicotine dependence, cigarettes, uncomplicated 11/25/2018   Chronic hepatitis C without hepatic coma (HCC) 05/28/2018   Alcohol abuse 02/16/2016   Cocaine abuse (HCC) 02/16/2016    Past Surgical History:  Procedure Laterality Date   CYST REMOVAL NECK     neck       Family History  Problem Relation Age of Onset   Hypertension Sister    Schizophrenia Sister     Social  History   Tobacco Use   Smoking status: Some Days    Packs/day: 1.00    Types: Cigarettes   Smokeless tobacco: Never  Vaping Use   Vaping Use: Never used  Substance Use Topics   Alcohol use: Yes    Alcohol/week: 3.0 standard drinks    Types: 3 Shots of liquor per week   Drug use: Yes    Types: Cocaine, Marijuana    Comment: last used today    Home Medications Prior to Admission medications   Medication Sig Start Date End Date Taking? Authorizing Provider  ARIPiprazole (ABILIFY) 20 MG tablet Take 1 tablet (20 mg total) by mouth daily. 07/27/21   Nwoko, Tommas Olp, PA  ARIPiprazole ER (ABILIFY MAINTENA) 400 MG SRER injection Inject 2 mLs (400 mg total) into the muscle every 28 (twenty-eight) days. 08/15/21   Jesse Sans, MD  citalopram (CELEXA) 20 MG tablet Take 1 tablet (20 mg total) by mouth daily. 07/27/21   Nwoko, Tommas Olp, PA  diphenhydrAMINE (BENADRYL) 50 MG tablet Take 1 tablet (50 mg total) by mouth at bedtime as needed for sleep. 08/03/21   Orion Crook I, NP  naproxen (NAPROSYN) 500 MG tablet Take 1 tablet (500 mg total) by mouth 2 (two) times daily with a meal. 07/27/21   Nwoko, Tommas Olp, PA  omeprazole (PRILOSEC)  40 MG capsule Take 1 capsule (40 mg total) by mouth daily as needed (acid reflux). 08/03/21   Orion Crook I, NP  pyridOXINE (VITAMIN B-6) 50 MG tablet Take 1 tablet (50 mg total) by mouth daily. 08/03/21   Orion Crook I, NP  thiamine 100 MG tablet Take 1 tablet (100 mg total) by mouth daily. 07/27/21   Nwoko, Tommas Olp, PA  traZODone (DESYREL) 100 MG tablet Take 1 tablet (100 mg total) by mouth at bedtime. 07/27/21   Meta Hatchet, PA    Allergies    Patient has no known allergies.  Review of Systems   Review of Systems  All other systems reviewed and are negative.  Physical Exam Updated Vital Signs BP (!) 146/86   Pulse (!) 58   Temp 98.8 F (37.1 C)   Resp 18   SpO2 99%   Physical Exam Vitals and nursing note reviewed.   Constitutional:      General: He is not in acute distress.    Appearance: He is well-developed.  HENT:     Head: Normocephalic and atraumatic.     Right Ear: External ear normal.     Left Ear: External ear normal.  Eyes:     General: No scleral icterus.       Right eye: No discharge.        Left eye: No discharge.     Conjunctiva/sclera: Conjunctivae normal.  Neck:     Trachea: No tracheal deviation.  Cardiovascular:     Rate and Rhythm: Normal rate and regular rhythm.  Pulmonary:     Effort: Pulmonary effort is normal. No respiratory distress.     Breath sounds: Normal breath sounds. No stridor. No wheezing or rales.  Abdominal:     General: Bowel sounds are normal. There is no distension.     Palpations: Abdomen is soft.     Tenderness: There is no abdominal tenderness. There is no guarding or rebound.  Musculoskeletal:        General: No tenderness or deformity.     Cervical back: Neck supple.  Skin:    General: Skin is warm and dry.     Findings: No rash.  Neurological:     General: No focal deficit present.     Mental Status: He is alert.     Cranial Nerves: No cranial nerve deficit (no facial droop, extraocular movements intact, no slurred speech).     Sensory: No sensory deficit.     Motor: No abnormal muscle tone or seizure activity.     Coordination: Coordination normal.  Psychiatric:        Mood and Affect: Mood is elated.        Speech: Speech is not delayed or tangential.        Behavior: Behavior is not agitated, aggressive or withdrawn. Behavior is cooperative.        Thought Content: Thought content does not include suicidal ideation.        Cognition and Memory: Cognition normal.        Judgment: Judgment is impulsive.    ED Results / Procedures / Treatments   Labs (all labs ordered are listed, but only abnormal results are displayed) Labs Reviewed  CBC WITH DIFFERENTIAL/PLATELET - Abnormal; Notable for the following components:      Result Value    HCT 38.8 (*)    Lymphs Abs 4.2 (*)    All other components within normal limits  COMPREHENSIVE METABOLIC PANEL - Abnormal;  Notable for the following components:   Calcium 8.6 (*)    ALT 45 (*)    All other components within normal limits  URINALYSIS, ROUTINE W REFLEX MICROSCOPIC - Abnormal; Notable for the following components:   Leukocytes,Ua SMALL (*)    Bacteria, UA RARE (*)    All other components within normal limits  RAPID URINE DRUG SCREEN, HOSP PERFORMED - Abnormal; Notable for the following components:   Tetrahydrocannabinol POSITIVE (*)    All other components within normal limits  ACETAMINOPHEN LEVEL - Abnormal; Notable for the following components:   Acetaminophen (Tylenol), Serum <10 (*)    All other components within normal limits  SALICYLATE LEVEL - Abnormal; Notable for the following components:   Salicylate Lvl <7.0 (*)    All other components within normal limits  RESP PANEL BY RT-PCR (FLU A&B, COVID) ARPGX2  MAGNESIUM  ETHANOL  GC/CHLAMYDIA PROBE AMP ([place]) NOT AT [facility]    EKG None  Radiology CT HEAD WO CONTRAST ( )  Result Date: 08/04/2021 CLINICAL DATA:  Head trauma, moderate to severe EXAM: CT HEAD WITHOUT CONTRAST TECHNIQUE: Contiguous axial images were obtained from the base of the skull through the vertex without intravenous contrast. COMPARISON:  05/28/2020 CT head, 06/16/2019 CT head and cervical spine FINDINGS: Brain: No evidence of acute infarction, hemorrhage, cerebral edema, mass, mass effect, or midline shift. Ventricles and sulci are normal for age. No extra-axial fluid collection. Vascular: No hyperdense vessel or unexpected calcification. Skull: Normal. Negative for fracture or focal lesion. Sinuses/Orbits: Mucous retention cyst in the right maxillary sinus and right sphenoid sinus. Orbits are unremarkable. Other: The mastoid air cells are well aerated. IMPRESSION: No acute intracranial process. Electronically Signed   By: Wiliam Ke M.D.    On: 08/04/2021 21:45   CT Cervical Spine Wo Contrast  Result Date: 08/04/2021 CLINICAL DATA:  Neck trauma EXAM: CT CERVICAL SPINE WITHOUT CONTRAST TECHNIQUE: Multidetector CT imaging of the cervical spine was performed without intravenous contrast. Multiplanar CT image reconstructions were also generated. COMPARISON:  CT cervical spine 06/16/2019 FINDINGS: Alignment: Mild straightening of the normal cervical lordosis, which may be positional. No listhesis. Skull base and vertebrae: No acute fracture. No primary bone lesion or focal pathologic process. Soft tissues and spinal canal: No prevertebral fluid or swelling. No visible canal hematoma. Disc levels: No high-grade spinal canal stenosis or neural foraminal narrowing. Upper chest: Apical blebs in the right lung. No focal pulmonary opacity. Other: None. IMPRESSION: No acute fracture or static listhesis in the cervical spine. Electronically Signed   By: Wiliam Ke M.D.   On: 08/04/2021 21:47    Procedures Procedures   Medications Ordered in ED Medications - No data to display  ED Course  I have reviewed the triage vital signs and the nursing notes.  Pertinent labs & imaging results that were available during my care of the patient were reviewed by me and considered in my medical decision making (see chart for details).    MDM Rules/Calculators/A&P                           Patient presented to the emergency room for evaluation of hallucinations and thoughts of suicide.  Patient does have history of mental health problems.  Patient is currently calm and cooperative.  Laboratory tests are unremarkable with the exception of some white blood cells and small leukocytes esterase in the urine.  Patient is asymptomatic.  We will send off a GC  chlamydia probe on the urine.  Patient is otherwise medically cleared for psychiatric evaluation  The patient has been placed in psychiatric observation due to the need to provide a safe environment for the  patient while obtaining psychiatric consultation and evaluation, as well as ongoing medical and medication management to treat the patient's condition.  The patient has not been placed under full IVC at this time.  Final Clinical Impression(s) / ED Diagnoses Final diagnoses:  Hallucination  Suicidal ideation     Linwood Dibbles, MD 08/05/21 [phone] Patient was seen by psychiatry.  Patient has contracted for safety.  He is no longer suicidal.  Outpatient arrangements have been made for him to proceed to the [facility] house.  Patient is stable for discharge and continued outpatient management   Linwood Dibbles, MD 08/05/21 1427

## 2021-08-05 NOTE — ED Notes (Signed)
Lunch tray delivered with fish, patient states he is allergic to fish, called kitchen and placed order for cheeseburger and fries.

## 2021-08-08 LAB — GC/CHLAMYDIA PROBE AMP (~~LOC~~) NOT AT ARMC
Chlamydia: NEGATIVE
Comment: NEGATIVE
Comment: NORMAL
Neisseria Gonorrhea: NEGATIVE

## 2021-08-15 ENCOUNTER — Telehealth: Payer: Self-pay

## 2021-08-15 ENCOUNTER — Other Ambulatory Visit (HOSPITAL_COMMUNITY): Payer: Self-pay

## 2021-08-15 NOTE — Telephone Encounter (Signed)
[facility] Patient Advocate Encounter ? ?Insurance verification completed.   ? ?The patient is uninsured and will need patient assistance for medication. ? ?We can complete the application and will need to meet with the patient for signatures and income documentation. ? ?Dominic [name], CPhT ?Specialty Pharmacy Patient Advocate ?[facility] ?Phone: [phone] ?Fax:  [phone]  ?

## 2021-08-16 ENCOUNTER — Encounter: Payer: Medicaid Other | Admitting: Family

## 2021-08-16 ENCOUNTER — Ambulatory Visit (HOSPITAL_COMMUNITY): Payer: Medicaid Other

## 2021-09-02 ENCOUNTER — Ambulatory Visit: Payer: Self-pay | Admitting: Nurse Practitioner

## 2021-09-08 ENCOUNTER — Encounter (HOSPITAL_COMMUNITY): Payer: Medicaid Other | Admitting: Physician Assistant

## 2021-12-21 ENCOUNTER — Inpatient Hospital Stay
Admission: AD | Admit: 2021-12-21 | Discharge: 2021-12-26 | DRG: 885 | Disposition: A | Discharge status: Home / Self Care | Payer: No Typology Code available for payment source | Source: Intra-hospital | Attending: Psychiatry | Admitting: Psychiatry

## 2021-12-21 ENCOUNTER — Emergency Department (EMERGENCY_DEPARTMENT_HOSPITAL)
Admission: EM | Admit: 2021-12-21 | Discharge: 2021-12-21 | Disposition: A | Discharge status: Home / Self Care | Payer: No Typology Code available for payment source | Source: Home / Self Care | Attending: Emergency Medicine | Admitting: Emergency Medicine

## 2021-12-21 ENCOUNTER — Emergency Department: Payer: No Typology Code available for payment source

## 2021-12-21 ENCOUNTER — Encounter: Payer: Self-pay | Admitting: Psychiatry

## 2021-12-21 ENCOUNTER — Encounter: Payer: Self-pay | Admitting: Emergency Medicine

## 2021-12-21 ENCOUNTER — Other Ambulatory Visit: Payer: Self-pay

## 2021-12-21 DIAGNOSIS — Z9151 Personal history of suicidal behavior: Secondary | ICD-10-CM

## 2021-12-21 DIAGNOSIS — F1410 Cocaine abuse, uncomplicated: Secondary | ICD-10-CM | POA: Diagnosis present

## 2021-12-21 DIAGNOSIS — F1010 Alcohol abuse, uncomplicated: Secondary | ICD-10-CM | POA: Diagnosis present

## 2021-12-21 DIAGNOSIS — T1491XA Suicide attempt, initial encounter: Secondary | ICD-10-CM | POA: Diagnosis not present

## 2021-12-21 DIAGNOSIS — F17210 Nicotine dependence, cigarettes, uncomplicated: Secondary | ICD-10-CM | POA: Diagnosis present

## 2021-12-21 DIAGNOSIS — X80XXXA Intentional self-harm by jumping from a high place, initial encounter: Secondary | ICD-10-CM | POA: Diagnosis present

## 2021-12-21 DIAGNOSIS — Z20822 Contact with and (suspected) exposure to covid-19: Secondary | ICD-10-CM | POA: Insufficient documentation

## 2021-12-21 DIAGNOSIS — R440 Auditory hallucinations: Secondary | ICD-10-CM | POA: Insufficient documentation

## 2021-12-21 DIAGNOSIS — G4700 Insomnia, unspecified: Secondary | ICD-10-CM | POA: Diagnosis present

## 2021-12-21 DIAGNOSIS — F251 Schizoaffective disorder, depressive type: Secondary | ICD-10-CM | POA: Diagnosis present

## 2021-12-21 DIAGNOSIS — B182 Chronic viral hepatitis C: Secondary | ICD-10-CM | POA: Diagnosis present

## 2021-12-21 DIAGNOSIS — Z79899 Other long term (current) drug therapy: Secondary | ICD-10-CM | POA: Insufficient documentation

## 2021-12-21 DIAGNOSIS — R45851 Suicidal ideations: Secondary | ICD-10-CM | POA: Insufficient documentation

## 2021-12-21 DIAGNOSIS — W19XXXA Unspecified fall, initial encounter: Secondary | ICD-10-CM

## 2021-12-21 DIAGNOSIS — F5105 Insomnia due to other mental disorder: Secondary | ICD-10-CM

## 2021-12-21 DIAGNOSIS — F319 Bipolar disorder, unspecified: Secondary | ICD-10-CM | POA: Diagnosis present

## 2021-12-21 DIAGNOSIS — F209 Schizophrenia, unspecified: Secondary | ICD-10-CM

## 2021-12-21 DIAGNOSIS — F411 Generalized anxiety disorder: Secondary | ICD-10-CM

## 2021-12-21 LAB — URINE DRUG SCREEN, QUALITATIVE (ARMC ONLY)
Amphetamines, Ur Screen: POSITIVE — AB
Barbiturates, Ur Screen: NOT DETECTED
Benzodiazepine, Ur Scrn: NOT DETECTED
Cannabinoid 50 Ng, Ur ~~LOC~~: POSITIVE — AB
Cocaine Metabolite,Ur ~~LOC~~: POSITIVE — AB
MDMA (Ecstasy)Ur Screen: NOT DETECTED
Methadone Scn, Ur: NOT DETECTED
Opiate, Ur Screen: NOT DETECTED
Phencyclidine (PCP) Ur S: NOT DETECTED
Tricyclic, Ur Screen: NOT DETECTED

## 2021-12-21 LAB — CBC
HCT: 37.3 % — ABNORMAL LOW (ref 39.0–52.0)
Hemoglobin: 13.3 g/dL (ref 13.0–17.0)
MCH: 29.6 pg (ref 26.0–34.0)
MCHC: 35.7 g/dL (ref 30.0–36.0)
MCV: 82.9 fL (ref 80.0–100.0)
Platelets: 243 10*3/uL (ref 150–400)
RBC: 4.5 MIL/uL (ref 4.22–5.81)
RDW: 14.4 % (ref 11.5–15.5)
WBC: 10.4 10*3/uL (ref 4.0–10.5)
nRBC: 0 % (ref 0.0–0.2)

## 2021-12-21 LAB — COMPREHENSIVE METABOLIC PANEL
ALT: 29 U/L (ref 0–44)
AST: 46 U/L — ABNORMAL HIGH (ref 15–41)
Albumin: 3.8 g/dL (ref 3.5–5.0)
Alkaline Phosphatase: 33 U/L — ABNORMAL LOW (ref 38–126)
Anion gap: 5 (ref 5–15)
BUN: 14 mg/dL (ref 6–20)
CO2: 21 mmol/L — ABNORMAL LOW (ref 22–32)
Calcium: 8.5 mg/dL — ABNORMAL LOW (ref 8.9–10.3)
Chloride: 106 mmol/L (ref 98–111)
Creatinine, Ser: 0.55 mg/dL — ABNORMAL LOW (ref 0.61–1.24)
GFR, Estimated: >60 mL/min (ref >60)
Glucose, Bld: 161 mg/dL — ABNORMAL HIGH (ref 70–99)
Potassium: 3.1 mmol/L — ABNORMAL LOW (ref 3.5–5.1)
Sodium: 132 mmol/L — ABNORMAL LOW (ref 135–145)
Total Bilirubin: 0.8 mg/dL (ref 0.3–1.2)
Total Protein: 7.5 g/dL (ref 6.5–8.1)

## 2021-12-21 LAB — SALICYLATE LEVEL: Salicylate Lvl: <7 mg/dL — ABNORMAL LOW (ref 7.0–30.0)

## 2021-12-21 LAB — ETHANOL: Alcohol, Ethyl (B): <10 mg/dL (ref <10)

## 2021-12-21 LAB — ACETAMINOPHEN LEVEL: Acetaminophen (Tylenol), Serum: <10 ug/mL — ABNORMAL LOW (ref 10–30)

## 2021-12-21 LAB — RESP PANEL BY RT-PCR (FLU A&B, COVID) ARPGX2
Influenza A by PCR: NEGATIVE
Influenza B by PCR: NEGATIVE
SARS Coronavirus 2 by RT PCR: NEGATIVE

## 2021-12-21 MED ORDER — MAGNESIUM HYDROXIDE 400 MG/5ML PO SUSP: 30.0000 mL | Freq: Every day | ORAL | Status: DC | PRN

## 2021-12-21 MED ORDER — ARIPIPRAZOLE 10 MG PO TABS
20.0000 mg | ORAL_TABLET | Freq: Every day | ORAL | Status: DC
  Administered 2021-12-21: 20 mg via ORAL
  Filled 2021-12-21 (×2): qty 2

## 2021-12-21 MED ORDER — ACETAMINOPHEN 500 MG PO TABS
1000.0000 mg | ORAL_TABLET | Freq: Once | ORAL | Status: AC
  Administered 2021-12-21: 1000 mg via ORAL
  Filled 2021-12-21: qty 2

## 2021-12-21 MED ORDER — NAPROXEN 500 MG PO TABS
500.0000 mg | ORAL_TABLET | Freq: Once | ORAL | Status: AC
  Administered 2021-12-21: 500 mg via ORAL
  Filled 2021-12-21: qty 1

## 2021-12-21 MED ORDER — ARIPIPRAZOLE 10 MG PO TABS
20.0000 mg | ORAL_TABLET | Freq: Every day | ORAL | Status: DC
  Administered 2021-12-22 – 2021-12-26 (×5): 20 mg via ORAL
  Filled 2021-12-21 (×5): qty 2

## 2021-12-21 MED ORDER — QUETIAPINE FUMARATE 300 MG PO TABS: 300.0000 mg | ORAL_TABLET | Freq: Every day | ORAL | Status: DC

## 2021-12-21 MED ORDER — NICOTINE 21 MG/24HR TD PT24
21.0000 mg | MEDICATED_PATCH | Freq: Every day | TRANSDERMAL | Status: DC
  Filled 2021-12-21 (×3): qty 1

## 2021-12-21 MED ORDER — CITALOPRAM HYDROBROMIDE 20 MG PO TABS
20.0000 mg | ORAL_TABLET | Freq: Every day | ORAL | Status: DC
  Administered 2021-12-22 – 2021-12-26 (×5): 20 mg via ORAL
  Filled 2021-12-21 (×5): qty 1

## 2021-12-21 MED ORDER — QUETIAPINE FUMARATE 200 MG PO TABS
300.0000 mg | ORAL_TABLET | Freq: Every day | ORAL | Status: DC
  Administered 2021-12-21: 300 mg via ORAL
  Filled 2021-12-21: qty 1

## 2021-12-21 MED ORDER — TRAZODONE HCL 100 MG PO TABS
100.0000 mg | ORAL_TABLET | Freq: Every day | ORAL | Status: DC
  Administered 2021-12-21 – 2021-12-25 (×3): 100 mg via ORAL
  Filled 2021-12-21 (×5): qty 1

## 2021-12-21 MED ORDER — TRAZODONE HCL 100 MG PO TABS: 100.0000 mg | ORAL_TABLET | Freq: Every day | ORAL | Status: DC

## 2021-12-21 MED ORDER — CITALOPRAM HYDROBROMIDE 20 MG PO TABS
20.0000 mg | ORAL_TABLET | Freq: Every day | ORAL | Status: DC
  Administered 2021-12-21: 20 mg via ORAL
  Filled 2021-12-21: qty 1

## 2021-12-21 MED ORDER — ALUM & MAG HYDROXIDE-SIMETH 200-200-20 MG/5ML PO SUSP: 30.0000 mL | ORAL | Status: DC | PRN

## 2021-12-21 MED ORDER — NICOTINE 21 MG/24HR TD PT24: 21.0000 mg | MEDICATED_PATCH | Freq: Every day | TRANSDERMAL | Status: DC

## 2021-12-21 MED ORDER — ACETAMINOPHEN 325 MG PO TABS
650.0000 mg | ORAL_TABLET | Freq: Four times a day (QID) | ORAL | Status: DC | PRN
  Administered 2021-12-22: 650 mg via ORAL
  Filled 2021-12-21: qty 2

## 2021-12-21 NOTE — ED Notes (Signed)
Pt notified about need for urine. Pt not able to urinate at this moment. Will retry to collect urine. NT made aware as well.

## 2021-12-21 NOTE — ED Triage Notes (Signed)
Pt comes into the ED via [facility] c/o SI.  Pt states he jumped off a porch trying to kill himself and he landed on the right side of his body.  Pt has been out of his pysch meds for 3-4 months and he is now having auditory hallucinations.  The hallucinations are what told him to jump off the porch.  Pt now c/o right side leg, arm, and bilateral feet pain.  Pt in NAD at this time with even and unlabored respirations.

## 2021-12-21 NOTE — ED Notes (Signed)
IVC inpatient  

## 2021-12-21 NOTE — BH Assessment (Signed)
Comprehensive Clinical Assessment (CCA) Note  12/21/2021 Dominic Jennet Maduro RN:[id number]  Dominic [name], 46 year old male who presents to [facility] ED involuntarily for treatment. Per triage note, Pt comes into the ED via [facility] c/o SI.  Pt states he jumped off a porch trying to kill himself and he landed on the right side of his body.  Pt has been out of his pysch meds for 3-4 months and he is now having auditory hallucinations.  The hallucinations are what told him to jump off the porch.  Pt now c/o right side leg, arm, and bilateral feet pain.  Pt in NAD at this time with even and unlabored respirations.    During TTS assessment pt presents alert and oriented x 4, restless but cooperative, and mood-congruent with affect. The pt does not appear to be responding to internal or external stimuli. Neither is the pt presenting with any delusional thinking. Pt verified the information provided to triage RN.   Pt identifies his main complaint to be that he is having auditory hallucinations and the voices are telling him to kill himself. Patient reports he tried today by jumping off the porch. Patient states he still feels suicidal. Patient also mentioned he has not taken his psych medications in about 2-3 months since returning home to care for his ill mother. I did not follow up with [facility]. Prior to coming home, patient was staying at the [facility] where he was compliant with his meds and staying clean. Patient admitted to using cocaine and marijuana yesterday. Patient states he has been trying to stay out of trouble and wants to make a change for his 14yr old daughter. Patient reports he is committed to having a healthier lifestyle and doing what it takes to make better decisions. Pt endorses vague SI and denies HI/VH. Pt is unable to contract for safety and believes inpatient treatment would be beneficial.    Per Barbaraann Share, NP, pt is recommended for inpatient psychiatric admission.  Chief Complaint:   Chief Complaint  Patient presents with   Suicidal   Visit Diagnosis: Auditory hallucination    CCA Screening, Triage and Referral (STR)  Patient Reported Information How did you hear about [place]? Self  Referral name: No data recorded Referral phone number: No data recorded  Whom do you see for routine medical problems? No data recorded Practice/Facility Name: No data recorded Practice/Facility Phone Number: No data recorded Name of Contact: No data recorded Contact Number: No data recorded Contact Fax Number: No data recorded Prescriber Name: No data recorded Prescriber Address (if known): No data recorded  What Is the Reason for Your Visit/Call Today? Patient brought to ED due to having command auditory hallucinations and a suicide attempt.  How Long Has This Been Causing You Problems? > than 6 months  What Do You Feel Would Help You the Most Today? Medication(s); Alcohol or Drug Use Treatment; Financial Resources; Social Support   Have You Recently Been in Any Inpatient Treatment (Hospital/Detox/Crisis Center/28-Day Program)? No data recorded Name/Location of Program/Hospital:No data recorded How Long Were You There? No data recorded When Were You Discharged? No data recorded  Have You Ever Received Services From [facility] Before? No data recorded Who Do You See at [facility]? No data recorded  Have You Recently Had Any Thoughts About Hurting Yourself? Yes  Are You Planning to Commit Suicide/Harm Yourself At This time? No   Have you Recently Had Thoughts About [place]? No  Explanation: No data recorded  Have You Used Any Alcohol or Drugs in the Past 24 Hours? Yes  How Long Ago Did You Use Drugs or Alcohol? No data recorded What Did You Use and How Much? Cocaine and marijuana   Do You Currently Have a Therapist/Psychiatrist? No  Name of Therapist/Psychiatrist: [facility] [place] Recently Discharged From Any Office  Practice or Programs? No  Explanation of Discharge From Practice/Program: No data recorded    CCA Screening Triage Referral Assessment Type of Contact: Face-to-Face  Is this Initial or Reassessment? No data recorded Date Telepsych consult ordered in [facility]:  No data recorded Time Telepsych consult ordered in [facility]:  No data recorded  Patient Reported Information Reviewed? No data recorded Patient Left Without Being Seen? No data recorded Reason for Not Completing Assessment: No data recorded  Collateral Involvement: None provided   Does Patient Have a [place]? No data recorded Name and Contact of Legal Guardian: No data recorded If Minor and Not Living with Parent(s), Who has Custody? n/a  Is CPS involved or ever been involved? Never  Is APS involved or ever been involved? Never   Patient Determined To Be At Risk for Harm To Self or Others Based on Review of Patient Reported Information or Presenting Complaint? Yes, for Self-Harm  Method: No data recorded Availability of Means: No data recorded Intent: No data recorded Notification Required: No data recorded Additional Information for Danger to Others Potential: No data recorded Additional Comments for Danger to Others Potential: No data recorded Are There Guns or Other Weapons in Your Home? No data recorded Types of Guns/Weapons: No data recorded Are These Weapons Safely Secured?                            No data recorded Who Could Verify You Are Able To Have These Secured: No data recorded Do You Have any Outstanding Charges, Pending Court Dates, Parole/Probation? No data recorded Contacted To Inform of Risk of Harm To Self or Others: Other: Comment (NA)   Location of Assessment: [facility] ED   Does Patient Present under Involuntary Commitment? Yes  IVC Papers Initial File Date: 12/21/21   [place] of Residence: [place]   Patient Currently Receiving the Following Services: Not Receiving  Services   Determination of Need: Urgent (48 hours)   Options For Referral: Inpatient Hospitalization; Medication Management; Chemical Dependency Intensive Outpatient Therapy (CDIOP)     CCA Biopsychosocial Intake/Chief Complaint:  No data recorded Current Symptoms/Problems: No data recorded  Patient Reported Schizophrenia/Schizoaffective Diagnosis in Past: Yes   Strengths: Has some support, some insight & knows he needs his medications.  Preferences: No data recorded Abilities: No data recorded  Type of Services Patient Feels are Needed: No data recorded  Initial Clinical Notes/Concerns: No data recorded  Mental Health Symptoms Depression:   Change in energy/activity   Duration of Depressive symptoms:  Greater than two weeks   Mania:   None   Anxiety:    Restlessness; Difficulty concentrating   Psychosis:   Hallucinations   Duration of Psychotic symptoms:  Greater than six months   Trauma:   N/A   Obsessions:   N/A   Compulsions:   N/A   Inattention:   N/A   Hyperactivity/Impulsivity:   Feeling of restlessness   Oppositional/Defiant Behaviors:   N/A   Emotional Irregularity:   Potentially harmful impulsivity; Recurrent suicidal behaviors/gestures/threats   Other Mood/Personality Symptoms:  No data  recorded   Mental Status Exam Appearance and self-care  Stature:   Average   Weight:   Average weight   Clothing:   Disheveled   Grooming:   Neglected   Cosmetic use:   None   Posture/gait:   Normal   Motor activity:   Restless   Sensorium  Attention:   Normal   Concentration:   Normal   Orientation:   X5   Recall/memory:   Normal   Affect and Mood  Affect:   Anxious; Appropriate   Mood:   Anxious   Relating  Eye contact:   Fleeting   Facial expression:   Responsive   Attitude toward examiner:   Cooperative   Thought and Language  Speech flow:  Pressured   Thought content:   Appropriate to Mood  and Circumstances   Preoccupation:   None   Hallucinations:   Auditory; Command (Comment) (Command voices telling him to kill himself)   Organization:  No data recorded  [employer] of Knowledge:   Average   Intelligence:   Average   Abstraction:   Functional   Judgement:   Impaired   Reality Testing:   Adequate   Insight:   Fair   Decision Making:   Impulsive   Social Functioning  Social Maturity:   Irresponsible; Impulsive   Social Judgement:   "Street Smart"   Stress  Stressors:   Transitions; Relationship; Illness   Coping Ability:   Overwhelmed   Skill Deficits:   Decision making; Self-control   Supports:   Family; Friends/Service system     Religion:    Leisure/Recreation:    Exercise/Diet:     CCA Employment/Education Employment/Work Situation: Employment / Work Technical sales engineer: On disability Why is Patient on Disability: Mental Health How Long has Patient Been on Disability: Unknown  Education:     CCA Family/Childhood History Family and Relationship History: Family history Does patient have children?: Yes How many children?: 2 How is patient's relationship with their children?: Patient reports his sister takes care of his 72yr old daughter.  Childhood History:  Childhood History By whom was/is the patient raised?: Mother  Child/Adolescent Assessment:     CCA Substance Use Alcohol/Drug Use: Alcohol / Drug Use Pain Medications: See PTA Prescriptions: See PTA Over the Counter: See PTA History of alcohol / drug use?: Yes Longest period of sobriety (when/how long): Unable to quantify Negative Consequences of Use: Personal relationships, Work / Youth worker, Museum/gallery curator Substance #1 Name of Substance 1: Cocaine                       ASAM's:  Six Dimensions of Multidimensional Assessment  Dimension 1:  Acute Intoxication and/or Withdrawal Potential:      Dimension 2:  Biomedical  Conditions and Complications:      Dimension 3:  Emotional, Behavioral, or Cognitive Conditions and Complications:     Dimension 4:  Readiness to Change:     Dimension 5:  Relapse, Continued use, or Continued Problem Potential:     Dimension 6:  Recovery/Living Environment:     ASAM Severity Score:    ASAM Recommended Level of Treatment:     Substance use Disorder (SUD)    Recommendations for Services/Supports/Treatments:    DSM5 Diagnoses: Patient Active Problem List   Diagnosis Date Noted   Auditory hallucination 08/05/2021   Insomnia due to other mental disorder 07/27/2021   Anxiety state 07/27/2021   Mild pain 07/27/2021   Left  eyelid laceration 07/15/2021   COVID-19 11/19/2020   Schizophrenia, undifferentiated ([place]) 05/22/2019   Schizophrenia, chronic condition ([place]) 01/14/2019   Alcohol intoxication, uncomplicated ([place]) [id number]   Asthma 11/25/2018   Nicotine dependence, cigarettes, uncomplicated 0000000   Chronic hepatitis C without hepatic coma ([place]) 05/28/2018   Alcohol abuse 02/16/2016   Cocaine abuse ([place]) 02/16/2016    Patient Centered Plan: Patient is on the following Treatment Plan(s):     Referrals to Alternative Service(s): Referred to Alternative Service(s):   Place:   Date:   Time:    Referred to Alternative Service(s):   Place:   Date:   Time:    Referred to Alternative Service(s):   Place:   Date:   Time:    Referred to Alternative Service(s):   Place:   Date:   Time:     Deidrick Rainey Glennon Mac, Counselor, LCAS-A

## 2021-12-21 NOTE — BH Assessment (Signed)
Patient is to be admitted to [facility] by Dr. Toni Amend.  Attending Physician will be Dr.  Toni Amend .   Patient has been assigned to room 307, by [facility] Charge Nurse, Wendall Mola.     ER staff is aware of the admission: Fleet Contras, ER Secretary   Dr. Vicente Males, ER MD  Amy, Patient's Nurse  Aggie Cosier, Patient Access.

## 2021-12-21 NOTE — ED Notes (Signed)
IVC PENDING  CONSULT ?

## 2021-12-21 NOTE — ED Triage Notes (Signed)
First Nurse note:  Arrives via [facility].  C/O right sided pain x 1 hour.  10/10 pain.  Only history is paranoid schizophrenia, off meds x 4 months.  VS wnl.

## 2021-12-21 NOTE — ED Notes (Signed)
IVC/ pt will be moved to BMU after shift change

## 2021-12-21 NOTE — ED Provider Notes (Signed)
[facility] Provider Note    Event Date/Time   First MD Initiated Contact with Patient 12/21/21 1406     (approximate)   History   Suicidal   HPI  Dominic [name] is a 46 y.o. male who presents to the ED for evaluation of Suicidal   I reviewed behavioral health DC summary from 11/14 where he was admitted to [facility] psychiatry.  Patient presents to the ED with suicidal thoughts and command auditory hallucinations.  He reports he tried to kill himself today by jumping off the first floor porch onto the ground.  Reports right-sided pain to his hip and knee from this.  Reports continued invasive auditory hallucinations and thoughts of harming himself.  Reports he has been off of his medications for a few months now since he was admitted at [facility]   Triage Vital Signs: ED Triage Vitals  Enc Vitals Group     BP 12/21/21 1349 127/82     Pulse Rate 12/21/21 1349 71     Resp 12/21/21 1349 18     Temp 12/21/21 1349 98.7 F (37.1 C)     Temp Source 12/21/21 1349 Oral     SpO2 12/21/21 1349 97 %     Weight 12/21/21 1350 213 lb 13.5 oz (97 kg)     Height 12/21/21 1350 5\' 11"  (1.803 m)     Head Circumference --      Peak Flow --      Pain Score 12/21/21 1350 10     Pain Loc --      Pain Edu? --      Excl. in [place]? --     Most recent vital signs: Vitals:   12/21/21 1349  BP: 127/82  Pulse: 71  Resp: 18  Temp: 98.7 F (37.1 C)  SpO2: 97%    General: Awake, no distress.  Able to stand independently and stand on the right leg and ambulate with a slightly antalgic gait. CV:  Good peripheral perfusion.  Resp:  Normal effort.  Abd:  No distention.  MSK:  No deformity noted.  No external signs of trauma noted or significant deformity Neuro:  No focal deficits appreciated. Cranial nerves II through XII intact 5/5 strength and sensation in all 4 extremities  Other:     ED Results / Procedures / Treatments   Labs (all  labs ordered are listed, but only abnormal results are displayed) Labs Reviewed  COMPREHENSIVE METABOLIC PANEL - Abnormal; Notable for the following components:      Result Value   Sodium 132 (*)    Potassium 3.1 (*)    CO2 21 (*)    Glucose, Bld 161 (*)    Creatinine, Ser 0.55 (*)    Calcium 8.5 (*)    AST 46 (*)    Alkaline Phosphatase 33 (*)    All other components within normal limits  SALICYLATE LEVEL - Abnormal; Notable for the following components:   Salicylate Lvl [id number] (*)    All other components within normal limits  ACETAMINOPHEN LEVEL - Abnormal; Notable for the following components:   Acetaminophen (Tylenol), Serum <10 (*)    All other components within normal limits  CBC - Abnormal; Notable for the following components:   HCT 37.3 (*)    All other components within normal limits  RESP PANEL BY RT-PCR (FLU A&B, COVID) [id number]  ETHANOL  URINE DRUG SCREEN, QUALITATIVE ([facility] ONLY)    EKG  RADIOLOGY Plain films of the chest, pelvis, right hip and right knee reviewed by me without evidence of fracture, dislocation or acute cardiopulmonary pathology.  Official radiology report(s): DG Chest 1 View  Result Date: 12/21/2021 CLINICAL DATA:  Suicidal ideations. Patient jumped from a porch. Auditory hallucinations. EXAM: CHEST  1 VIEW COMPARISON:  Radiographs 06/29/2021 and 01/18/2021.  CT 06/16/2019. FINDINGS: 1437 hours. Two views obtained. The heart size and mediastinal contours are normal. The lungs are clear. There is no pleural effusion or pneumothorax. No acute osseous findings are identified. Mild degenerative changes in the spine. IMPRESSION: Stable chest.  No evidence of active cardiopulmonary process. Electronically Signed   By: Richardean Sale M.D.   On: 12/21/2021 14:50   DG Knee Complete 4 Views Right  Result Date: 12/21/2021 CLINICAL DATA:  Suicidal ideations. Patient jumped from a porch. Auditory hallucinations. EXAM: RIGHT KNEE - COMPLETE 4+ VIEW COMPARISON:   None. FINDINGS: The mineralization and alignment are normal. There is no evidence of acute fracture or dislocation. There are mild tricompartmental degenerative changes. There is fragmented spurring along the inferior aspect of the patella at the patellar tendon insertion. Small knee joint effusion noted. No evidence of foreign body or soft tissue emphysema. IMPRESSION: No acute osseous findings. Fragmented enthesophytes along the lower pole of the patella. Electronically Signed   By: Richardean Sale M.D.   On: 12/21/2021 14:54   DG Hip Unilat W or Wo Pelvis 2-3 Views Right  Result Date: 12/21/2021 CLINICAL DATA:  Suicidal ideations. Patient jumped from a porch. Auditory hallucinations. EXAM: DG HIP (WITH OR WITHOUT PELVIS) 2-3V RIGHT COMPARISON:  Pelvic CT 06/16/2019 FINDINGS: The mineralization and alignment are normal. There is no evidence of acute fracture or dislocation. Moderate degenerative changes of both hips. The sacroiliac joints and symphysis pubis appear intact. 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 There is stable spurring along the left superior pubic ramus. IMPRESSION: No acute osseous findings.  Hip and sacroiliac degenerative changes. Electronically Signed   By: Richardean Sale M.D.   On: 12/21/2021 14:53    PROCEDURES and INTERVENTIONS:  Procedures  Medications  acetaminophen (TYLENOL) tablet 1,000 mg (1,000 mg Oral Given 12/21/21 1434)  naproxen (NAPROSYN) tablet 500 mg (500 mg Oral Given 12/21/21 1434)     IMPRESSION / MDM / ASSESSMENT AND PLAN / ED COURSE  I reviewed the triage vital signs and the nursing notes.  46 year old male presents to the ED with suicidal thoughts with a plan to harm himself, requiring IVC and psychiatric evaluation.  He reports jumping off of a porch today, but not really seeing much significant trauma.  Imaging of the affected sites of pain without evidence of fracture or dislocation.  No laceration or injury requiring  repair.  CBC is normal and CMP with marginal electrolyte derangements with hyponatremia and hypokalemia that we will replete orally with solid food.  No barriers to psychiatric valuation.  We will consult psychiatry for further work-up and management.  IVC placed due to his continued thoughts of harming himself.      FINAL CLINICAL IMPRESSION(S) / ED DIAGNOSES   Final diagnoses:  Suicidal thoughts     Rx / DC Orders   ED Discharge Orders     None        Note:  This document was prepared using Dragon voice recognition software and may include unintentional dictation errors.   Vladimir Crofts, MD 12/21/21 1524

## 2021-12-21 NOTE — Consult Note (Signed)
[facility] Face-to-Face Psychiatry Consult   Reason for Consult:  Auditory hallucinations Referring Physician:  Delton [place] Patient Identification: Dominic [name] MRN:  [id number] Principal Diagnosis: Auditory hallucination Diagnosis:  Principal Problem:   Auditory hallucination   Total Time spent with patient: 1 hour  Subjective:   Dominic [name] is a 46 y.o. male patient admitted with auditory hallucinations telling him to kill himself.  HPI: Patient has known schizophrenia and substance use disorder.  He presented to the ED today and after jumping off of a porch in a suicide attempt he states, due to auditory hallucinations telling him to do so.  Patient states that he was in [facility] [place] until about 3 months ago, when he came to [place] to take care of his mother who is ill.  He states he was taking his medication when he was [place] but when he came to [place] he stopped taking it.  Patient admits that he did not go get his medication and he should have. Patient was seen face to face.  He admits to using cocaine and marijuana yesterday.  He has a flat affect, but is somewhat coherent, not aggressive.  He states that he is constantly hearing voices telling him to hurt himself.  He says that he wants to get "clean" for his 47 year old daughter.  He states that he is "tired of living like this."  Recommend inpatient psychiatric hospitalization Past Psychiatric History: Schizophrenia  Risk to Self:   Risk to Others:   Prior Inpatient Therapy:   Prior Outpatient Therapy:    Past Medical History:  Past Medical History:  Diagnosis Date   Anxiety    Asthma    Bipolar 1 disorder (HCC)    Chronic hepatitis C without hepatic coma (HCC) 05/28/2018   Depression    Hepatitis C    Hepatitis C antibody positive in blood 05/28/2018   Schizophrenia (HCC)    Sleep apnea     Past Surgical History:  Procedure Laterality Date   CYST REMOVAL NECK     neck    Family History:  Family History  Problem Relation Age of Onset   Hypertension Sister    Schizophrenia Sister    Family Psychiatric  History: Unknown Social History:  Social History   Substance and Sexual Activity  Alcohol Use Yes   Alcohol/week: 3.0 standard drinks   Types: 3 Shots of liquor per week     Social History   Substance and Sexual Activity  Drug Use Yes   Types: Cocaine, Marijuana   Comment: last used today    Social History   Socioeconomic History   Marital status: Single    Spouse name: Not on file   Number of children: Not on file   Years of education: Not on file   Highest education level: Not on file  Occupational History   Not on file  Tobacco Use   Smoking status: Some Days    Packs/day: 1.00    Types: Cigarettes   Smokeless tobacco: Never  Vaping Use   Vaping Use: Never used  Substance and Sexual Activity   Alcohol use: Yes    Alcohol/week: 3.0 standard drinks    Types: 3 Shots of liquor per week   Drug use: Yes    Types: Cocaine, Marijuana    Comment: last used today   Sexual activity: Not Currently  Other Topics Concern   Not on file  Social History Narrative   Not on file  Social Determinants of Health   Financial Resource Strain: Not on file  Food Insecurity: Not on file  Transportation Needs: Not on file  Physical Activity: Not on file  Stress: Not on file  Social Connections: Not on file   Additional Social History:    Allergies:  No Known Allergies  Labs:  Results for orders placed or performed during the hospital encounter of 12/21/21 (from the past 48 hour(s))  Comprehensive metabolic panel     Status: Abnormal   Collection Time: 12/21/21  1:52 PM  Result Value Ref Range   Sodium 132 (L) 135 - 145 mmol/L   Potassium 3.1 (L) 3.5 - 5.1 mmol/L   Chloride 106 98 - 111 mmol/L   CO2 21 (L) 22 - 32 mmol/L   Glucose, Bld 161 (H) 70 - 99 mg/dL    Comment: Glucose reference range applies only to samples taken after  fasting for at least 8 hours.   BUN 14 6 - 20 mg/dL   Creatinine, Ser 4.090.55 (L) 0.61 - 1.24 mg/dL   Calcium 8.5 (L) 8.9 - 10.3 mg/dL   Total Protein 7.5 6.5 - 8.1 g/dL   Albumin 3.8 3.5 - 5.0 g/dL   AST 46 (H) 15 - 41 U/L   ALT 29 0 - 44 U/L   Alkaline Phosphatase 33 (L) 38 - 126 U/L   Total Bilirubin 0.8 0.3 - 1.2 mg/dL   GFR, Estimated >81>60 >19>60 mL/min    Comment: (NOTE) Calculated using the CKD-EPI Creatinine Equation (2021)    Anion gap 5 5 - 15    Comment: Performed at [facility], [address] [place], [place] [address]  Ethanol     Status: None   Collection Time: 12/21/21  1:52 PM  Result Value Ref Range   Alcohol, Ethyl (B) <10 <10 mg/dL    Comment: (NOTE) Lowest detectable limit for serum alcohol is 10 mg/dL.  For medical purposes only. Performed at [facility], [address] [place], [place] [address]   Salicylate level     Status: Abnormal   Collection Time: 12/21/21  1:52 PM  Result Value Ref Range   Salicylate Lvl <7.0 (L) 7.0 - 30.0 mg/dL    Comment: Performed at [facility], [address] [place], [place] [address]  Acetaminophen level     Status: Abnormal   Collection Time: 12/21/21  1:52 PM  Result Value Ref Range   Acetaminophen (Tylenol), Serum <10 (L) 10 - 30 ug/mL    Comment: (NOTE) Therapeutic concentrations vary significantly. A range of 10-30 ug/mL  may be an effective concentration for many patients. However, some  are best treated at concentrations outside of this range. Acetaminophen concentrations >150 ug/mL at 4 hours after ingestion  and >50 ug/mL at 12 hours after ingestion are often associated with  toxic reactions.  Performed at [facility], [address] [place], [place] [address]   cbc     Status: Abnormal   Collection Time: 12/21/21  1:52 PM  Result Value Ref Range   WBC 10.4 4.0 - 10.5 K/uL   RBC 4.50 4.22 - 5.81 MIL/uL   Hemoglobin 13.3 13.0 - 17.0 g/dL   HCT 62.937.3 (L) 52.839.0 -  52.0 %   MCV 82.9 80.0 - 100.0 fL   MCH 29.6 26.0 - 34.0 pg   MCHC 35.7 30.0 - 36.0 g/dL   RDW 41.314.4 24.411.5 - 01.015.5 %   Platelets 243 150 - 400 K/uL   nRBC  0.0 0.0 - 0.2 %    Comment: Performed at [facility], [address] [place], [place] [address]  Resp Panel by RT-PCR (Flu A&B, Covid) Nasopharyngeal Swab     Status: None   Collection Time: 12/21/21  2:33 PM   Specimen: Nasopharyngeal Swab; Nasopharyngeal(NP) swabs in vial transport medium  Result Value Ref Range   SARS Coronavirus 2 by RT PCR NEGATIVE NEGATIVE    Comment: (NOTE) SARS-CoV-2 target nucleic acids are NOT DETECTED.  The SARS-CoV-2 RNA is generally detectable in upper respiratory specimens during the acute phase of infection. The lowest concentration of SARS-CoV-2 viral copies this assay can detect is 138 copies/mL. A negative result does not preclude SARS-Cov-2 infection and should not be used as the sole basis for treatment or other patient management decisions. A negative result may occur with  improper specimen collection/handling, submission of specimen other than nasopharyngeal swab, presence of viral mutation(s) within the areas targeted by this assay, and inadequate number of viral copies(<138 copies/mL). A negative result must be combined with clinical observations, patient history, and epidemiological information. The expected result is Negative.  Fact Sheet for Patients:  BloggerCourse.com  Fact Sheet for Healthcare Providers:  SeriousBroker.it  This test is no t yet approved or cleared by the [place] FDA and  has been authorized for detection and/or diagnosis of SARS-CoV-2 by FDA under an Emergency Use Authorization (EUA). This EUA will remain  in effect (meaning this test can be used) for the duration of the COVID-19 declaration under Section 564(b)(1) of the Act, 21 U.S.C.section 360bbb-3(b)(1), unless the authorization is terminated   or revoked sooner.       Influenza A by PCR NEGATIVE NEGATIVE   Influenza B by PCR NEGATIVE NEGATIVE    Comment: (NOTE) The Xpert Xpress SARS-CoV-2/FLU/RSV plus assay is intended as an aid in the diagnosis of influenza from Nasopharyngeal swab specimens and should not be used as a sole basis for treatment. Nasal washings and aspirates are unacceptable for Xpert Xpress SARS-CoV-2/FLU/RSV testing.  Fact Sheet for Patients: BloggerCourse.com  Fact Sheet for Healthcare Providers: SeriousBroker.it  This test is not yet approved or cleared by the [place] FDA and has been authorized for detection and/or diagnosis of SARS-CoV-2 by FDA under an Emergency Use Authorization (EUA). This EUA will remain in effect (meaning this test can be used) for the duration of the COVID-19 declaration under Section 564(b)(1) of the Act, 21 U.S.C. section 360bbb-3(b)(1), unless the authorization is terminated or revoked.  Performed at [facility], [address] [place], [place] [address]     Current Facility-Administered Medications  Medication Dose Route Frequency Provider Last Rate Last Admin   ARIPiprazole (ABILIFY) tablet 20 mg  20 mg Oral Daily Clapacs, John T, MD   20 mg at 12/21/21 1716   citalopram (CELEXA) tablet 20 mg  20 mg Oral Daily Clapacs, Jackquline Denmark, MD   20 mg at 12/21/21 1716   Current Outpatient Medications  Medication Sig Dispense Refill   ARIPiprazole (ABILIFY) 20 MG tablet Take 1 tablet (20 mg total) by mouth daily. 30 tablet 0   ARIPiprazole ER (ABILIFY MAINTENA) 400 MG SRER injection Inject 2 mLs (400 mg total) into the muscle every 28 (twenty-eight) days. 1 each 3   citalopram (CELEXA) 20 MG tablet Take 1 tablet (20 mg total) by mouth daily. 30 tablet 1   diphenhydrAMINE (BENADRYL) 50 MG tablet Take 1 tablet (50 mg total) by mouth at bedtime as needed for sleep. 30  tablet 0   lithium carbonate (LITHOBID) 300 MG  CR tablet Take 1 tablet by mouth every 12 (twelve) hours as needed.     mirtazapine (REMERON) 7.5 MG tablet Take 7.5 mg by mouth at bedtime.     naltrexone (DEPADE) 50 MG tablet Take 50 mg by mouth daily.     naproxen (NAPROSYN) 500 MG tablet Take 1 tablet (500 mg total) by mouth 2 (two) times daily with a meal. 60 tablet 0   nicotine (NICODERM CQ - DOSED IN MG/24 HOURS) 21 mg/24hr patch 21 mg daily.     omeprazole (PRILOSEC) 40 MG capsule Take 1 capsule (40 mg total) by mouth daily as needed (acid reflux). (Patient not taking: Reported on 12/21/2021) 30 capsule 3   pyridOXINE (VITAMIN B-6) 50 MG tablet Take 1 tablet (50 mg total) by mouth daily. 30 tablet 0   QUEtiapine (SEROQUEL) 300 MG tablet Take 300 mg by mouth at bedtime.     thiamine 100 MG tablet Take 1 tablet (100 mg total) by mouth daily. 30 tablet 0   traZODone (DESYREL) 100 MG tablet Take 1 tablet (100 mg total) by mouth at bedtime. 30 tablet 1    Musculoskeletal: Strength & Muscle Tone: within normal limits Gait & Station: normal Patient leans: N/A  Psychiatric Specialty Exam:  Presentation  General Appearance: Disheveled  Eye Contact:Good  Speech:Clear and Coherent  Speech Volume:Normal  Handedness:Right   Mood and Affect  Mood:Depressed; Anxious  Affect:Congruent   Thought Process  Thought Processes:Coherent  Descriptions of Associations:Intact  Orientation:Full (Time, Place and Person)  Thought Content:Logical  History of Schizophrenia/Schizoaffective disorder:Yes  Duration of Psychotic Symptoms:Greater than six months  Hallucinations:Hallucinations: Auditory Description of Auditory Hallucinations: "Voices telling me to kill myself"  Ideas of Reference:Delusions  Suicidal Thoughts:Suicidal Thoughts: Yes, Passive SI Passive Intent and/or Plan: With Intent  Homicidal Thoughts:Homicidal Thoughts: No   Sensorium  Memory:Immediate Good  Judgment:Poor  Insight:Fair   Executive Functions   Concentration:Fair  Attention Span:Fair  Recall:Fair  Fund of Knowledge:Fair  Language:Fair   Psychomotor Activity  Psychomotor Activity:Psychomotor Activity: Normal   Assets  Assets:Desire for Improvement; Resilience   Sleep  Sleep:Sleep: Fair   Physical Exam: Physical Exam Vitals reviewed.  HENT:     Head: Normocephalic.     Nose: No congestion or rhinorrhea.  Eyes:     General:        Right eye: No discharge.        Left eye: No discharge.  Cardiovascular:     Rate and Rhythm: Normal rate.  Pulmonary:     Effort: Pulmonary effort is normal.  Musculoskeletal:        General: Normal range of motion.     Cervical back: Normal range of motion.  Skin:    General: Skin is dry.  Neurological:     Mental Status: He is oriented to person, place, and time.  Psychiatric:        Behavior: Behavior normal.   Review of Systems  Psychiatric/Behavioral:  Positive for hallucinations, substance abuse and suicidal ideas.   All other systems reviewed and are negative. Blood pressure 127/82, pulse 71, temperature 98.7 F (37.1 C), temperature source Oral, resp. rate 18, height 5\' 11"  (1.803 m), weight 97 kg, SpO2 97 %. Body mass index is 29.83 kg/m.  Treatment Plan Summary: Daily contact with patient to assess and evaluate symptoms and progress in treatment, Medication management, and Plan admit to behavioral health unit and restart medications. Discussed with Dr. and  EDP  Disposition: Recommend psychiatric Inpatient admission when medically cleared.  Vanetta MuldersLouise F Anneta Rounds, NP 12/21/2021 5:48 PM

## 2021-12-21 NOTE — Tx Team (Signed)
Initial Treatment Plan 12/21/2021 10:42 PM Dominic [name] HER:[id number]    PATIENT STRESSORS: Financial difficulties   Substance abuse     PATIENT STRENGTHS: Communication skills  Supportive family/friends    PATIENT IDENTIFIED PROBLEMS: Living situation may change need discharge planning  Depression  Anxiety                 DISCHARGE CRITERIA:  Improved stabilization in mood, thinking, and/or behavior  PRELIMINARY DISCHARGE PLAN: Return to previous living arrangement  PATIENT/FAMILY INVOLVEMENT: This treatment plan has been presented to and reviewed with the patient, Dominic [name].  The patient have been given the opportunity to ask questions and make suggestions.  Jarome Matin, RN 12/21/2021, 10:42 PM

## 2021-12-21 NOTE — ED Notes (Signed)
Resumed care from dee rn.   Psych NP in with pt.

## 2021-12-21 NOTE — ED Notes (Addendum)
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

## 2021-12-22 DIAGNOSIS — F251 Schizoaffective disorder, depressive type: Principal | ICD-10-CM

## 2021-12-22 LAB — LITHIUM LEVEL: Lithium Lvl: <0.06 mmol/L — ABNORMAL LOW (ref 0.60–1.20)

## 2021-12-22 LAB — BASIC METABOLIC PANEL
Anion gap: 7 (ref 5–15)
BUN: 13 mg/dL (ref 6–20)
CO2: 24 mmol/L (ref 22–32)
Calcium: 8.7 mg/dL — ABNORMAL LOW (ref 8.9–10.3)
Chloride: 104 mmol/L (ref 98–111)
Creatinine, Ser: 0.68 mg/dL (ref 0.61–1.24)
GFR, Estimated: >60 mL/min (ref >60)
Glucose, Bld: 105 mg/dL — ABNORMAL HIGH (ref 70–99)
Potassium: 4.1 mmol/L (ref 3.5–5.1)
Sodium: 135 mmol/L (ref 135–145)

## 2021-12-22 LAB — GLUCOSE, CAPILLARY: Glucose-Capillary: 138 mg/dL — ABNORMAL HIGH (ref 70–99)

## 2021-12-22 MED ORDER — IBUPROFEN 600 MG PO TABS: 800.0000 mg | ORAL_TABLET | Freq: Three times a day (TID) | ORAL | Status: DC | PRN

## 2021-12-22 NOTE — Plan of Care (Signed)
  Problem: Education: Goal: Knowledge of [facility] Education information/materials will improve Outcome: Progressing Goal: Emotional status will improve Outcome: Progressing Goal: Mental status will improve Outcome: Progressing Goal: Verbalization of understanding the information provided will improve Outcome: Progressing   Problem: Activity: Goal: Interest or engagement in activities will improve Outcome: Progressing Goal: Sleeping patterns will improve Outcome: Progressing   Problem: Coping: Goal: Ability to verbalize frustrations and anger appropriately will improve Outcome: Progressing Goal: Ability to demonstrate self-control will improve Outcome: Progressing   Problem: Health Behavior/Discharge Planning: Goal: Identification of resources available to assist in meeting health care needs will improve Outcome: Progressing Goal: Compliance with treatment plan for underlying cause of condition will improve Outcome: Progressing   Problem: Physical Regulation: Goal: Ability to maintain clinical measurements within normal limits will improve Outcome: Progressing   Problem: Safety: Goal: Periods of time without injury will increase Outcome: Progressing   

## 2021-12-22 NOTE — Group Note (Signed)
LCSW Group Therapy Note  Group Date: 12/22/2021 Start Time: 1300 End Time: 1400   Type of Therapy and Topic:  Group Therapy - How To Cope with Nervousness about Discharge   Participation Level:  Did Not Attend   Description of Group This process group involved identification of patients' feelings about discharge. Some of them are scheduled to be discharged soon, while others are new admissions, but each of them was asked to share thoughts and feelings surrounding discharge from the hospital. One common theme was that they are excited at the prospect of going home, while another was that many of them are apprehensive about sharing why they were hospitalized. Patients were given the opportunity to discuss these feelings with their peers in preparation for discharge.  Therapeutic Goals  Patient will identify their overall feelings about pending discharge. Patient will think about how they might proactively address issues that they believe will once again arise once they get home (i.e. with parents). Patients will participate in discussion about having hope for change.   Summary of Patient Progress:   X  Therapeutic Modalities Cognitive Behavioral Therapy   Harden Mo, LCSWA 12/22/2021  1:59 PM

## 2021-12-22 NOTE — BHH Suicide Risk Assessment (Signed)
[facility] Admission Suicide Risk Assessment   Nursing information obtained from:  Patient Demographic factors:  Male Current Mental Status:  Suicidal ideation indicated by patient Loss Factors:  NA Historical Factors:  Prior suicide attempts Risk Reduction Factors:  Living with another person, especially a relative  Total Time spent with patient: 1 hour Principal Problem: Schizoaffective disorder, depressive type (HCC) Diagnosis:  Principal Problem:   Schizoaffective disorder, depressive type (HCC) Active Problems:   Alcohol abuse   Cocaine abuse (HCC)   Chronic hepatitis C without hepatic coma (HCC)   Nicotine dependence, cigarettes, uncomplicated  Subjective Data: Patient seen and chart reviewed.  Patient known from previous encounters.  46 year old man with a history of schizoaffective disorder and alcohol and cocaine abuse.  Presented to the hospital after intentionally jumping off of a second floor balcony in an impulsive suicide attempt.  Patient is currently insightful and cooperative with treatment.  Says he has no intent or wish to act on any suicidal thought while he is here in the hospital.  Patient shows insight into the problems with his recent drug and alcohol abuse and noncompliance with medicine and is specifically asking for treatment  Continued Clinical Symptoms:  Alcohol Use Disorder Identification Test Final Score (AUDIT): 2 The "Alcohol Use Disorders Identification Test", Guidelines for Use in Primary Care, Second Edition.  World [employer] writer [facility]). Score between 0-7:  no or low risk or alcohol related problems. Score between 8-15:  moderate risk of alcohol related problems. Score between 16-19:  high risk of alcohol related problems. Score 20 or above:  warrants further diagnostic evaluation for alcohol dependence and treatment.   CLINICAL FACTORS:   Bipolar Disorder:   Mixed State Alcohol/Substance Abuse/Dependencies   Musculoskeletal: Strength & Muscle  Tone: within normal limits Gait & Station: normal Patient leans: N/A  Psychiatric Specialty Exam:  Presentation  General Appearance: Disheveled  Eye Contact:Good  Speech:Clear and Coherent  Speech Volume:Normal  Handedness:Right   Mood and Affect  Mood:Depressed; Anxious  Affect:Congruent   Thought Process  Thought Processes:Coherent  Descriptions of Associations:Intact  Orientation:Full (Time, Place and Person)  Thought Content:Logical  History of Schizophrenia/Schizoaffective disorder:Yes  Duration of Psychotic Symptoms:Greater than six months  Hallucinations:Hallucinations: Auditory Description of Auditory Hallucinations: "Voices telling me to kill myself"  Ideas of Reference:Delusions  Suicidal Thoughts:Suicidal Thoughts: Yes, Passive SI Passive Intent and/or Plan: With Intent  Homicidal Thoughts:Homicidal Thoughts: No   Sensorium  Memory:Immediate Good  Judgment:Poor  Insight:Fair   Executive Functions  Concentration:Fair  Attention Span:Fair  Recall:Fair  Fund of Knowledge:Fair  Language:Fair   Psychomotor Activity  Psychomotor Activity:Psychomotor Activity: Normal   Assets  Assets:Desire for Improvement; Resilience   Sleep  Sleep:Sleep: Fair    Physical Exam: Physical Exam Vitals and nursing note reviewed.  Constitutional:      Appearance: Normal appearance.  HENT:     Head: Normocephalic and atraumatic.     Mouth/Throat:     Pharynx: Oropharynx is clear.  Eyes:     Pupils: Pupils are equal, round, and reactive to light.  Cardiovascular:     Rate and Rhythm: Normal rate and regular rhythm.  Pulmonary:     Effort: Pulmonary effort is normal.     Breath sounds: Normal breath sounds.  Abdominal:     General: Abdomen is flat.     Palpations: Abdomen is soft.  Musculoskeletal:        General: Normal range of motion.  Skin:    General: Skin is warm and dry.  Neurological:  General: No focal deficit present.      Mental Status: He is alert. Mental status is at baseline.  Psychiatric:        Attention and Perception: Attention normal. He perceives auditory hallucinations.        Mood and Affect: Mood is depressed.        Speech: Speech normal.        Behavior: Behavior is cooperative.        Thought Content: Thought content includes suicidal ideation. Thought content does not include suicidal plan.        Cognition and Memory: Memory is impaired.        Judgment: Judgment is impulsive.   Review of Systems  Constitutional: Negative.   HENT: Negative.    Eyes: Negative.   Respiratory: Negative.    Cardiovascular: Negative.   Gastrointestinal: Negative.   Musculoskeletal:  Positive for myalgias.  Skin: Negative.   Neurological: Negative.   Psychiatric/Behavioral:  Positive for depression, hallucinations, memory loss, substance abuse and suicidal ideas. The patient is nervous/anxious and has insomnia.   Blood pressure 113/75, pulse 79, temperature 97.8 F (36.6 C), temperature source Oral, resp. rate 17, height 5\' 10"  (1.778 m), weight 80.7 kg, SpO2 99 %. Body mass index is 25.54 kg/m.   COGNITIVE FEATURES THAT CONTRIBUTE TO RISK:  None    SUICIDE RISK:   Mild:  Suicidal ideation of limited frequency, intensity, duration, and specificity.  There are no identifiable plans, no associated intent, mild dysphoria and related symptoms, good self-control (both objective and subjective assessment), few other risk factors, and identifiable protective factors, including available and accessible social support.  PLAN OF CARE: Continue 15-minute checks.  Restarting medicine based on what has been an effective for him in the past.  Supportive counseling and therapy and encourage patient to talk to various treatment team members about follow-up care.  Specifically he is talking about wanting to get into a substance abuse program and I encouraged him to ask social work about it.  15-minute checks.  Reassess  dangerousness prior to discharge  I certify that inpatient services furnished can reasonably be expected to improve the patient's condition.   , MD 12/22/2021, 11:13 AM

## 2021-12-22 NOTE — Progress Notes (Signed)
D: Pt alert and oriented. Pt rates depression 10/10, hopelessness 5/10, and anxiety 6/10.Pt goal: "Stay off drugs." Pt reports energy level as low and concentration as being good. Pt reports sleep last night as being fair. Pt did receive medications for sleep and did find them helpful. Pt reports experiencing 8/10 bilat shoulder pain at this time, prn meds given. Pt denies experiencing any HI, or AVH at this time, however endorse SI w/o a plan while here. Pt verbally contracts for safety.  A: Scheduled medications administered to pt, per MD orders. Support and encouragement provided. Frequent verbal contact made. Routine safety checks conducted q15 minutes.   R: No adverse drug reactions noted. Pt verbally contracts for safety at this time. Pt complaint with medications and treatment plan. Pt interacts minimally with others on the unit. Pt remains safe at this time. Will continue to monitor.

## 2021-12-22 NOTE — H&P (Signed)
Psychiatric Admission Assessment Adult  Patient Identification: Dominic [name] MRN:  [id number] Date of Evaluation:  12/22/2021 Chief Complaint:  Schizoaffective disorder, depressive type ([place]) [F25.1] Principal Diagnosis: Schizoaffective disorder, depressive type ([place]) Diagnosis:  Principal Problem:   Schizoaffective disorder, depressive type ([place]) Active Problems:   Alcohol abuse   Cocaine abuse ([place])   Chronic hepatitis C without hepatic coma ([place])   Nicotine dependence, cigarettes, uncomplicated  History of Present Illness: Patient seen and chart reviewed.  Patient known from previous encounters.  46 year old man with a history of schizoaffective disorder and substance abuse.  He presented to the hospital after intentionally jumping from a second floor balcony with suicidal intent.  It sounds like he landed on grass.  He did not break any bones or suffer any serious injury but laid on the ground for quite a while before he came to the hospital.  Patient says his mood has been very depressed.  Feels down and negative and bad about his situation most of the time.  Sleeping poorly at night.  Not eating well and not taking care of himself.  Has not been taking any of his psychiatric medicine in several months.  Patient says he has been drinking about 2 of the 40 ounce bottles of beer each day and using crack cocaine regularly.  He is having auditory hallucinations much of the time.  Denies homicidal ideation.  Denies any other new medical issues.  He tells me that he thinks the immediate stress that drove him to jump from the balcony was learning that his mother plans to move to a new home or apartment and told him that he would not be able to come with her. Associated Signs/Symptoms: Depression Symptoms:  depressed mood, anhedonia, insomnia, feelings of worthlessness/guilt, hopelessness, suicidal attempt, Duration of Depression Symptoms: Greater than two weeks  (Hypo) Manic Symptoms:   Impulsivity, Irritable Mood, Anxiety Symptoms:  Excessive Worry, Psychotic Symptoms:  Hallucinations: Auditory PTSD Symptoms: Negative Total Time spent with patient: 1 hour  Past Psychiatric History: Patient has a long history of chronic mental health issues and substance abuse treatment.  Has been seen multiple times in the emergency room and also has prior admissions multiple times.  Medicines that have seemed to be effective for him in the past were Abilify and citalopram although honestly what seems to have been the most important key to staying stable with probably staying off of cocaine and alcohol.  He does have a past history of suicide attempts and self-injury  Is the patient at risk to self? Yes.    Has the patient been a risk to self in the past 6 months? Yes.    Has the patient been a risk to self within the distant past? Yes.    Is the patient a risk to others? No.  Has the patient been a risk to others in the past 6 months? No.  Has the patient been a risk to others within the distant past? Yes.     Prior Inpatient Therapy:   Prior Outpatient Therapy:    Alcohol Screening: 1. How often do you have a drink containing alcohol?: Monthly or less 2. How many drinks containing alcohol do you have on a typical day when you are drinking?: 1 or 2 3. How often do you have six or more drinks on one occasion?: Less than monthly AUDIT-C Score: 2 4. How often during the last year have you found that you were not able to stop drinking once you  had started?: Never 5. How often during the last year have you failed to do what was normally expected from you because of drinking?: Never 6. How often during the last year have you needed a first drink in the morning to get yourself going after a heavy drinking session?: Never 7. How often during the last year have you had a feeling of guilt of remorse after drinking?: Never 8. How often during the last year have you been unable to remember what  happened the night before because you had been drinking?: Never 9. Have you or someone else been injured as a result of your drinking?: No 10. Has a relative or friend or a doctor or another health worker been concerned about your drinking or suggested you cut down?: No Alcohol Use Disorder Identification Test Final Score (AUDIT): 2 Substance Abuse History in the last 12 months:  Yes.   Consequences of Substance Abuse: It worsens his medical issues including his hepatitis C by discouraging him to get it treated and preventing him from taking care of himself well.  Clearly makes his mood worse and leads to this sort of impulsive behavior with suicide attempts Previous Psychotropic Medications: Yes  Psychological Evaluations: Yes  Past Medical History:  Past Medical History:  Diagnosis Date   Anxiety    Asthma    Bipolar 1 disorder (HCC)    Chronic hepatitis C without hepatic coma (HCC) 05/28/2018   Depression    Hepatitis C    Hepatitis C antibody positive in blood 05/28/2018   Schizophrenia (HCC)    Sleep apnea     Past Surgical History:  Procedure Laterality Date   CYST REMOVAL NECK     neck   Family History:  Family History  Problem Relation Age of Onset   Hypertension Sister    Schizophrenia Sister    Family Psychiatric  History: There is a family history of schizophrenia Tobacco Screening:   Social History:  Social History   Substance and Sexual Activity  Alcohol Use Yes   Alcohol/week: 3.0 standard drinks   Types: 3 Shots of liquor per week     Social History   Substance and Sexual Activity  Drug Use Yes   Types: Cocaine, Marijuana   Comment: last used today    Additional Social History:                           Allergies:  No Known Allergies Lab Results:  Results for orders placed or performed during the hospital encounter of 12/21/21 (from the past 48 hour(s))  Lithium level     Status: Abnormal   Collection Time: 12/22/21  5:36 AM  Result  Value Ref Range   Lithium Lvl <0.06 (L) 0.60 - 1.20 mmol/L    Comment: Performed at [facility], [address] [place], [place] [address]    Blood Alcohol level:  Lab Results  Component Value Date   [facility] <10 12/21/2021   ETH <10 08/05/2021    Metabolic Disorder Labs:  Lab Results  Component Value Date   HGBA1C 5.6 07/16/2021   MPG 114 07/16/2021   MPG 105.41 04/08/2018   No results found for: PROLACTIN Lab Results  Component Value Date   CHOL 194 08/03/2021   TRIG 99 08/03/2021   HDL 69 08/03/2021   CHOLHDL 2.8 08/03/2021   VLDL 12 07/16/2021   LDLCALC 107 (H) 08/03/2021   LDLCALC 98 07/16/2021    Current  Medications: Current Facility-Administered Medications  Medication Dose Route Frequency Provider Last Rate Last Admin   acetaminophen (TYLENOL) tablet 650 mg  650 mg Oral Q6H PRN Sherlon Handing, NP   650 mg at 12/22/21 0848   alum & mag hydroxide-simeth (MAALOX/MYLANTA) 200-200-20 MG/5ML suspension 30 mL  30 mL Oral Q4H PRN Sherlon Handing, NP       ARIPiprazole (ABILIFY) tablet 20 mg  20 mg Oral Daily Waldon Merl F, NP   20 mg at 12/22/21 [id number]   citalopram (CELEXA) tablet 20 mg  20 mg Oral Daily Waldon Merl F, NP   20 mg at 12/22/21 [id number]   ibuprofen (ADVIL) tablet 800 mg  800 mg Oral Q8H PRN Laterria [place]ota, [name], MD       magnesium hydroxide (MILK OF MAGNESIA) suspension 30 mL  30 mL Oral Daily PRN Waldon Merl F, NP       nicotine (NICODERM CQ - dosed in mg/24 hours) patch 21 mg  21 mg Transdermal Daily Waldon Merl F, NP   21 mg at 12/22/21 [id number]   traZODone (DESYREL) tablet 100 mg  100 mg Oral QHS Waldon Merl F, NP   100 mg at 12/21/21 2141   PTA Medications: Medications Prior to Admission  Medication Sig Dispense Refill Last Dose   ARIPiprazole (ABILIFY) 20 MG tablet Take 1 tablet (20 mg total) by mouth daily. 30 tablet 0    ARIPiprazole ER (ABILIFY MAINTENA) 400 MG SRER injection Inject 2 mLs (400 mg total) into the muscle  every 28 (twenty-eight) days. 1 each 3    citalopram (CELEXA) 20 MG tablet Take 1 tablet (20 mg total) by mouth daily. 30 tablet 1    diphenhydrAMINE (BENADRYL) 50 MG tablet Take 1 tablet (50 mg total) by mouth at bedtime as needed for sleep. 30 tablet 0    lithium carbonate (LITHOBID) 300 MG CR tablet Take 1 tablet by mouth every 12 (twelve) hours as needed.      mirtazapine (REMERON) 7.5 MG tablet Take 7.5 mg by mouth at bedtime.      naltrexone (DEPADE) 50 MG tablet Take 50 mg by mouth daily.      naproxen (NAPROSYN) 500 MG tablet Take 1 tablet (500 mg total) by mouth 2 (two) times daily with a meal. 60 tablet 0    nicotine (NICODERM CQ - DOSED IN MG/24 HOURS) 21 mg/24hr patch 21 mg daily.      omeprazole (PRILOSEC) 40 MG capsule Take 1 capsule (40 mg total) by mouth daily as needed (acid reflux). (Patient not taking: Reported on 12/21/2021) 30 capsule 3    pyridOXINE (VITAMIN B-6) 50 MG tablet Take 1 tablet (50 mg total) by mouth daily. 30 tablet 0    QUEtiapine (SEROQUEL) 300 MG tablet Take 300 mg by mouth at bedtime.      thiamine 100 MG tablet Take 1 tablet (100 mg total) by mouth daily. 30 tablet 0    traZODone (DESYREL) 100 MG tablet Take 1 tablet (100 mg total) by mouth at bedtime. 30 tablet 1     Musculoskeletal: Strength & Muscle Tone: within normal limits Gait & Station: normal Patient leans: N/A            Psychiatric Specialty Exam:  Presentation  General Appearance: Disheveled  Eye Contact:Good  Speech:Clear and Coherent  Speech Volume:Normal  Handedness:Right   Mood and Affect  Mood:Depressed; Anxious  Affect:Congruent   Thought Process  Thought Processes:Coherent  Duration of Psychotic Symptoms: Greater than six months  Past Diagnosis of Schizophrenia or Psychoactive disorder: Yes  Descriptions of Associations:Intact  Orientation:Full (Time, Place and Person)  Thought Content:Logical  Hallucinations:Hallucinations: Auditory Description  of Auditory Hallucinations: "Voices telling me to kill myself"  Ideas of Reference:Delusions  Suicidal Thoughts:Suicidal Thoughts: Yes, Passive SI Passive Intent and/or Plan: With Intent  Homicidal Thoughts:Homicidal Thoughts: No   Sensorium  Memory:Immediate Good  Judgment:Poor  Insight:Fair   Executive Functions  Concentration:Fair  Attention Span:Fair  [place]   Psychomotor Activity  Psychomotor Activity:Psychomotor Activity: Normal   Assets  Assets:Desire for Improvement; Resilience   Sleep  Sleep:Sleep: Fair    Physical Exam: Physical Exam Vitals and nursing note reviewed.  Constitutional:      Appearance: Normal appearance.  HENT:     Head: Normocephalic and atraumatic.     Mouth/Throat:     Pharynx: Oropharynx is clear.  Eyes:     Pupils: Pupils are equal, round, and reactive to light.  Cardiovascular:     Rate and Rhythm: Normal rate and regular rhythm.  Pulmonary:     Effort: Pulmonary effort is normal.     Breath sounds: Normal breath sounds.  Abdominal:     General: Abdomen is flat.     Palpations: Abdomen is soft.  Musculoskeletal:        General: Normal range of motion.  Skin:    General: Skin is warm and dry.  Neurological:     General: No focal deficit present.     Mental Status: He is alert. Mental status is at baseline.  Psychiatric:        Attention and Perception: Attention normal. He perceives auditory hallucinations.        Mood and Affect: Mood is depressed.        Speech: Speech normal.        Behavior: Behavior is cooperative.        Thought Content: Thought content includes suicidal ideation. Thought content does not include suicidal plan.        Cognition and Memory: Memory is impaired.        Judgment: Judgment is impulsive.   Review of Systems  Constitutional: Negative.   HENT: Negative.    Eyes: Negative.   Respiratory: Negative.    Cardiovascular: Negative.    Gastrointestinal: Negative.   Musculoskeletal:  Positive for myalgias.  Skin: Negative.   Neurological: Negative.   Psychiatric/Behavioral:  Positive for depression, hallucinations, substance abuse and suicidal ideas. The patient is nervous/anxious and has insomnia.   Blood pressure 113/75, pulse 79, temperature 97.8 F (36.6 C), temperature source Oral, resp. rate 17, height 5\' 10"  (1.778 m), weight 80.7 kg, SpO2 99 %. Body mass index is 25.54 kg/m.  Treatment Plan Summary: Daily contact with patient to assess and evaluate symptoms and progress in treatment, Medication management, and Plan patient is restarted on Abilify 20 mg a day and Celexa 20 mg a day.  He had been restarted on quetiapine but in the past he has not required 2 different antipsychotics.  Vital signs will be monitored.  Continue 15-minute checks.  He told me today that he was interested in possibly going to [facility].  It sounds like he is kind of desperate and thinks he may not have a place to stay.  I briefly described what I know about the program but encouraged him that I thought that anything he could do to turn his life around would be a good idea.  Observation Level/Precautions:  15 minute  checks  Laboratory:  UDS  Psychotherapy:    Medications:    Consultations:    Discharge Concerns:    Estimated LOS:  Other:     Physician Treatment Plan for Primary Diagnosis: Schizoaffective disorder, depressive type ([place]) Long Term Goal(s): Improvement in symptoms so as ready for discharge  Short Term Goals: Ability to verbalize feelings will improve, Ability to disclose and discuss suicidal ideas, and Ability to demonstrate self-control will improve  Physician Treatment Plan for Secondary Diagnosis: Principal Problem:   Schizoaffective disorder, depressive type ([place]) Active Problems:   Alcohol abuse   Cocaine abuse ([place])   Chronic hepatitis C without hepatic coma (HCC)   Nicotine dependence, cigarettes,  uncomplicated  Long Term Goal(s): Improvement in symptoms so as ready for discharge  Short Term Goals: Compliance with prescribed medications will improve and Ability to identify triggers associated with substance abuse/mental health issues will improve  I certify that inpatient services furnished can reasonably be expected to improve the patient's condition.    Alethia Berthold, MD 2/9/202311:15 AM

## 2021-12-22 NOTE — Progress Notes (Signed)
Admission Note: report received from Fleet Contras RN patient is a 46 year old AA male transferred from [facility] ED IVC for a suicide attempt. 02/08 reported  jumping off high porch. Minor injuries observed. Expresses pain in both feet and right leg. Stated pain level was a 9 but refused pain medication. Walks with a limp. Pt was calm and cooperative during assessment. Denies HI and active SI and able to contract for safety. Does however endorse AH/VH. States he sees spirits and shadows, and hears voices telling to harm himself.  Admission plan of care reviewed with pt, consent signed.  Personal belongings/skin assessment completed.   No contraband found.  Patient oriented to the unit, staff and room.  Routine safety checks initiated.  Verbalizes understanding of unit rules/protocols.   Patient is presently safe on the unit. No unsafe behaviors noted.  Q 15 minute safety checks maintained per unit protocol.

## 2021-12-22 NOTE — Progress Notes (Signed)
Pt isolating in room, no interaction with peers or staff. Pt reports SI thoughts, but no plans and able to contract for safety. No HI, he denies visual hallucinations, but reports hearing voices to hurt himself. He does not talk about his stressors or life, but he will only answer questions.

## 2021-12-22 NOTE — Plan of Care (Signed)
?  Problem: Education: ?Goal: Knowledge of [place] [facility] Education information/materials will improve ?Outcome: Not Progressing ?Goal: Emotional status will improve ?Outcome: Not Progressing ?Goal: Mental status will improve ?Outcome: Not Progressing ?Goal: Verbalization of understanding the information provided will improve ?Outcome: Not Progressing ?  ?Problem: Activity: ?Goal: Interest or engagement in activities will improve ?Outcome: Not Progressing ?Goal: Sleeping patterns will improve ?Outcome: Not Progressing ?  ?Problem: Coping: ?Goal: Ability to verbalize frustrations and anger appropriately will improve ?Outcome: Not Progressing ?Goal: Ability to demonstrate self-control will improve ?Outcome: Not Progressing ?  ?Problem: Health Behavior/Discharge Planning: ?Goal: Identification of resources available to assist in meeting health care needs will improve ?Outcome: Not Progressing ?Goal: Compliance with treatment plan for underlying cause of condition will improve ?Outcome: Not Progressing ?  ?Problem: Physical Regulation: ?Goal: Ability to maintain clinical measurements within normal limits will improve ?Outcome: Not Progressing ?  ?Problem: Safety: ?Goal: Periods of time without injury will increase ?Outcome: Not Progressing ?  ?
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
## 2021-12-23 NOTE — Progress Notes (Signed)
[facility] MD Progress Note  12/23/2021 3:51 PM Dominic [name]  MRN:  [id number] Subjective: Follow-up 46 year old man with schizoaffective disorder and substance abuse.  Patient has been compliant with medicine.  He is seen today and has no complaints.  He says his hallucinations are much better.  Mood is improving.  He is still looking forward to trying to pursue rehabilitation treatment Principal Problem: Schizoaffective disorder, depressive type ([place]) Diagnosis: Principal Problem:   Schizoaffective disorder, depressive type ([place]) Active Problems:   Alcohol abuse   Cocaine abuse ([place])   Chronic hepatitis C without hepatic coma (HCC)   Nicotine dependence, cigarettes, uncomplicated  Total Time spent with patient: 30 minutes  Past Psychiatric History: Past history of longstanding chronic struggles with the same sorts of problems  Past Medical History:  Past Medical History:  Diagnosis Date   Anxiety    Asthma    Bipolar 1 disorder ([place])    Chronic hepatitis C without hepatic coma ([place]) 05/28/2018   Depression    Hepatitis C    Hepatitis C antibody positive in blood 05/28/2018   Schizophrenia ([place])    Sleep apnea     Past Surgical History:  Procedure Laterality Date   CYST REMOVAL NECK     neck   Family History:  Family History  Problem Relation Age of Onset   Hypertension Sister    Schizophrenia Sister    Family Psychiatric  History: See previous Social History:  Social History   Substance and Sexual Activity  Alcohol Use Yes   Alcohol/week: 3.0 standard drinks   Types: 3 Shots of liquor per week     Social History   Substance and Sexual Activity  Drug Use Yes   Types: Cocaine, Marijuana   Comment: last used today    Social History   Socioeconomic History   Marital status: Single    Spouse name: Not on file   Number of children: Not on file   Years of education: Not on file   Highest education level: Not on file  Occupational History   Not on file   Tobacco Use   Smoking status: Some Days    Packs/day: 1.00    Types: Cigarettes   Smokeless tobacco: Never  Vaping Use   Vaping Use: Never used  Substance and Sexual Activity   Alcohol use: Yes    Alcohol/week: 3.0 standard drinks    Types: 3 Shots of liquor per week   Drug use: Yes    Types: Cocaine, Marijuana    Comment: last used today   Sexual activity: Not Currently  Other Topics Concern   Not on file  Social History Narrative   Not on file   Social Determinants of Health   Financial Resource Strain: Not on file  Food Insecurity: Not on file  Transportation Needs: Not on file  Physical Activity: Not on file  Stress: Not on file  Social Connections: Not on file   Additional Social History:                         Sleep: Fair  Appetite:  Fair  Current Medications: Current Facility-Administered Medications  Medication Dose Route Frequency Provider Last Rate Last Admin   acetaminophen (TYLENOL) tablet 650 mg  650 mg Oral Q6H PRN Waldon Merl F, NP   650 mg at 12/22/21 0848   alum & mag hydroxide-simeth (MAALOX/MYLANTA) 200-200-20 MG/5ML suspension 30 mL  30 mL Oral Q4H PRN Sherlon Handing,  NP       ARIPiprazole (ABILIFY) tablet 20 mg  20 mg Oral Daily Waldon Merl F, NP   20 mg at 12/23/21 [id number]   citalopram (CELEXA) tablet 20 mg  20 mg Oral Daily Waldon Merl F, NP   20 mg at 12/23/21 [id number]   ibuprofen (ADVIL) tablet 800 mg  800 mg Oral Q8H PRN Analiz Tvedt, [name], MD       magnesium hydroxide (MILK OF MAGNESIA) suspension 30 mL  30 mL Oral Daily PRN Sherlon Handing, NP       nicotine (NICODERM CQ - dosed in mg/24 hours) patch 21 mg  21 mg Transdermal Daily Waldon Merl F, NP       traZODone (DESYREL) tablet 100 mg  100 mg Oral QHS Sherlon Handing, NP   100 mg at 12/22/21 2128    Lab Results:  Results for orders placed or performed during the hospital encounter of 12/21/21 (from the past 48 hour(s))  Lithium level     Status:  Abnormal   Collection Time: 12/22/21  5:36 AM  Result Value Ref Range   Lithium Lvl <0.06 (L) 0.60 - 1.20 mmol/L    Comment: Performed at [facility], [address] [place], [place] [id number]  Basic metabolic panel     Status: Abnormal   Collection Time: 12/22/21  5:36 AM  Result Value Ref Range   Sodium 135 135 - 145 mmol/L   Potassium 4.1 3.5 - 5.1 mmol/L   Chloride 104 98 - 111 mmol/L   CO2 24 22 - 32 mmol/L   Glucose, Bld 105 (H) 70 - 99 mg/dL    Comment: Glucose reference range applies only to samples taken after fasting for at least 8 hours.   BUN 13 6 - 20 mg/dL   Creatinine, Ser 0.68 0.61 - 1.24 mg/dL   Calcium 8.7 (L) 8.9 - 10.3 mg/dL   GFR, Estimated >60 >60 mL/min    Comment: (NOTE) Calculated using the CKD-EPI Creatinine Equation (2021)    Anion gap 7 5 - 15    Comment: Performed at [facility], [address] [place], [place] [address]  Glucose, capillary     Status: Abnormal   Collection Time: 12/22/21 11:16 AM  Result Value Ref Range   Glucose-Capillary 138 (H) 70 - 99 mg/dL    Comment: Glucose reference range applies only to samples taken after fasting for at least 8 hours.   Comment 1 Notify RN     Blood Alcohol level:  Lab Results  Component Value Date   ETH <10 12/21/2021   ETH <10 [id number]    Metabolic Disorder Labs: Lab Results  Component Value Date   HGBA1C 5.6 07/16/2021   MPG 114 07/16/2021   MPG 105.41 04/08/2018   No results found for: PROLACTIN Lab Results  Component Value Date   CHOL 194 08/03/2021   TRIG 99 08/03/2021   HDL 69 08/03/2021   CHOLHDL 2.8 08/03/2021   VLDL 12 07/16/2021   LDLCALC 107 (H) 08/03/2021   LDLCALC 98 07/16/2021    Physical Findings: AIMS:  , ,  ,  ,    CIWA:    COWS:     Musculoskeletal: Strength & Muscle Tone: within normal limits Gait & Station: normal Patient leans: N/A  Psychiatric Specialty Exam:  Presentation  General Appearance: Disheveled  Eye  Contact:Good  Speech:Clear and Coherent  Speech Volume:Normal  Handedness:Right   Mood and Affect  Mood:Depressed; Anxious  Affect:Congruent  Thought Process  Thought Processes:Coherent  Descriptions of Associations:Intact  Orientation:Full (Time, Place and Person)  Thought Content:Logical  History of Schizophrenia/Schizoaffective disorder:Yes  Duration of Psychotic Symptoms:Greater than six months  Hallucinations:No data recorded Ideas of Reference:Delusions  Suicidal Thoughts:No data recorded Homicidal Thoughts:No data recorded  Sensorium  Memory:Immediate Good  Judgment:Poor  Insight:Fair   Executive Functions  Concentration:Fair  Attention Span:Fair  [place]   Psychomotor Activity  Psychomotor Activity:No data recorded  Assets  Assets:Desire for Improvement; Resilience   Sleep  Sleep:No data recorded   Physical Exam: Physical Exam Vitals and nursing note reviewed.  Constitutional:      Appearance: Normal appearance.  HENT:     Head: Normocephalic and atraumatic.     Mouth/Throat:     Pharynx: Oropharynx is clear.  Eyes:     Pupils: Pupils are equal, round, and reactive to light.  Cardiovascular:     Rate and Rhythm: Normal rate and regular rhythm.  Pulmonary:     Effort: Pulmonary effort is normal.     Breath sounds: Normal breath sounds.  Abdominal:     General: Abdomen is flat.     Palpations: Abdomen is soft.  Musculoskeletal:        General: Normal range of motion.  Skin:    General: Skin is warm and dry.  Neurological:     General: No focal deficit present.     Mental Status: He is alert. Mental status is at baseline.  Psychiatric:        Mood and Affect: Mood normal.        Thought Content: Thought content normal.   Review of Systems  Constitutional: Negative.   HENT: Negative.    Eyes: Negative.   Respiratory: Negative.    Cardiovascular: Negative.    Gastrointestinal: Negative.   Musculoskeletal: Negative.   Skin: Negative.   Neurological: Negative.   Psychiatric/Behavioral: Negative.    Blood pressure 110/79, pulse (!) 58, temperature 98.1 F (36.7 C), temperature source Oral, resp. rate 17, height 5\' 10"  (1.778 m), weight 80.7 kg, SpO2 94 %. Body mass index is 25.54 kg/m.   Treatment Plan Summary: Medication management and Plan no change to current antidepressant and antipsychotic medication.  Supportive counseling and encouragement.  Treatment team will be working with him on rehabilitation options.  Alethia Berthold, MD 12/23/2021, 3:51 PM

## 2021-12-23 NOTE — BH IP Treatment Plan (Signed)
Interdisciplinary Treatment and Diagnostic Plan Update  12/23/2021 Time of Session: 9:30AM Dominic [name] MRN: [id number]  Principal Diagnosis: Schizoaffective disorder, depressive type ([place])  Secondary Diagnoses: Principal Problem:   Schizoaffective disorder, depressive type ([place]) Active Problems:   Alcohol abuse   Cocaine abuse ([place])   Chronic hepatitis C without hepatic coma (HCC)   Nicotine dependence, cigarettes, uncomplicated   Current Medications:  Current Facility-Administered Medications  Medication Dose Route Frequency Provider Last Rate Last Admin   acetaminophen (TYLENOL) tablet 650 mg  650 mg Oral Q6H PRN Sherlon Handing, NP   650 mg at 12/22/21 0848   alum & mag hydroxide-simeth (MAALOX/MYLANTA) 200-200-20 MG/5ML suspension 30 mL  30 mL Oral Q4H PRN Sherlon Handing, NP       ARIPiprazole (ABILIFY) tablet 20 mg  20 mg Oral Daily Waldon Merl F, NP   20 mg at 12/23/21 [id number]   citalopram (CELEXA) tablet 20 mg  20 mg Oral Daily Waldon Merl F, NP   20 mg at 12/23/21 [id number]   ibuprofen (ADVIL) tablet 800 mg  800 mg Oral Q8H PRN Clapacs, [name], MD       magnesium hydroxide (MILK OF MAGNESIA) suspension 30 mL  30 mL Oral Daily PRN Waldon Merl F, NP       nicotine (NICODERM CQ - dosed in mg/24 hours) patch 21 mg  21 mg Transdermal Daily Waldon Merl F, NP       traZODone (DESYREL) tablet 100 mg  100 mg Oral QHS Waldon Merl F, NP   100 mg at 12/22/21 2128   PTA Medications: Medications Prior to Admission  Medication Sig Dispense Refill Last Dose   ARIPiprazole (ABILIFY) 20 MG tablet Take 1 tablet (20 mg total) by mouth daily. 30 tablet 0    ARIPiprazole ER (ABILIFY MAINTENA) 400 MG SRER injection Inject 2 mLs (400 mg total) into the muscle every 28 (twenty-eight) days. 1 each 3    citalopram (CELEXA) 20 MG tablet Take 1 tablet (20 mg total) by mouth daily. 30 tablet 1    diphenhydrAMINE (BENADRYL) 50 MG tablet Take 1 tablet (50 mg total) by mouth  at bedtime as needed for sleep. 30 tablet 0    lithium carbonate (LITHOBID) 300 MG CR tablet Take 1 tablet by mouth every 12 (twelve) hours as needed.      mirtazapine (REMERON) 7.5 MG tablet Take 7.5 mg by mouth at bedtime.      naltrexone (DEPADE) 50 MG tablet Take 50 mg by mouth daily.      naproxen (NAPROSYN) 500 MG tablet Take 1 tablet (500 mg total) by mouth 2 (two) times daily with a meal. 60 tablet 0    nicotine (NICODERM CQ - DOSED IN MG/24 HOURS) 21 mg/24hr patch 21 mg daily.      omeprazole (PRILOSEC) 40 MG capsule Take 1 capsule (40 mg total) by mouth daily as needed (acid reflux). (Patient not taking: Reported on 12/21/2021) 30 capsule 3    pyridOXINE (VITAMIN B-6) 50 MG tablet Take 1 tablet (50 mg total) by mouth daily. 30 tablet 0    QUEtiapine (SEROQUEL) 300 MG tablet Take 300 mg by mouth at bedtime.      thiamine 100 MG tablet Take 1 tablet (100 mg total) by mouth daily. 30 tablet 0    traZODone (DESYREL) 100 MG tablet Take 1 tablet (100 mg total) by mouth at bedtime. 30 tablet 1     Patient Stressors: Financial difficulties   Substance abuse  Patient Strengths: Armed forces logistics/support/administrative officer  Supportive family/friends   Treatment Modalities: Medication Management, Group therapy, Case management,  1 to 1 session with clinician, Psychoeducation, Recreational therapy.   Physician Treatment Plan for Primary Diagnosis: Schizoaffective disorder, depressive type ([place]) Long Term Goal(s): Improvement in symptoms so as ready for discharge   Short Term Goals: Compliance with prescribed medications will improve Ability to identify triggers associated with substance abuse/mental health issues will improve Ability to verbalize feelings will improve Ability to disclose and discuss suicidal ideas Ability to demonstrate self-control will improve  Medication Management: Evaluate patient's response, side effects, and tolerance of medication regimen.  Therapeutic Interventions: 1 to 1 sessions,  Unit Group sessions and Medication administration.  Evaluation of Outcomes: Progressing  Physician Treatment Plan for Secondary Diagnosis: Principal Problem:   Schizoaffective disorder, depressive type ([place]) Active Problems:   Alcohol abuse   Cocaine abuse ([place])   Chronic hepatitis C without hepatic coma (HCC)   Nicotine dependence, cigarettes, uncomplicated  Long Term Goal(s): Improvement in symptoms so as ready for discharge   Short Term Goals: Compliance with prescribed medications will improve Ability to identify triggers associated with substance abuse/mental health issues will improve Ability to verbalize feelings will improve Ability to disclose and discuss suicidal ideas Ability to demonstrate self-control will improve     Medication Management: Evaluate patient's response, side effects, and tolerance of medication regimen.  Therapeutic Interventions: 1 to 1 sessions, Unit Group sessions and Medication administration.  Evaluation of Outcomes: Progressing   RN Treatment Plan for Primary Diagnosis: Schizoaffective disorder, depressive type ([place]) Long Term Goal(s): Knowledge of disease and therapeutic regimen to maintain health will improve  Short Term Goals: Ability to demonstrate self-control, Ability to participate in decision making will improve, Ability to verbalize feelings will improve, Ability to disclose and discuss suicidal ideas, Ability to identify and develop effective coping behaviors will improve, and Compliance with prescribed medications will improve  Medication Management: RN will administer medications as ordered by provider, will assess and evaluate patient's response and provide education to patient for prescribed medication. RN will report any adverse and/or side effects to prescribing provider.  Therapeutic Interventions: 1 on 1 counseling sessions, Psychoeducation, Medication administration, Evaluate responses to treatment, Monitor vital signs and CBGs  as ordered, Perform/monitor CIWA, COWS, AIMS and Fall Risk screenings as ordered, Perform wound care treatments as ordered.  Evaluation of Outcomes: Progressing   LCSW Treatment Plan for Primary Diagnosis: Schizoaffective disorder, depressive type ([place]) Long Term Goal(s): Safe transition to appropriate next level of care at discharge, Engage patient in therapeutic group addressing interpersonal concerns.  Short Term Goals: Engage patient in aftercare planning with referrals and resources, Increase social support, Increase ability to appropriately verbalize feelings, Increase emotional regulation, Facilitate acceptance of mental health diagnosis and concerns, Facilitate patient progression through stages of change regarding substance use diagnoses and concerns, Identify triggers associated with mental health/substance abuse issues, and Increase skills for wellness and recovery  Therapeutic Interventions: Assess for all discharge needs, 1 to 1 time with Social worker, Explore available resources and support systems, Assess for adequacy in community support network, Educate family and significant other(s) on suicide prevention, Complete Psychosocial Assessment, Interpersonal group therapy.  Evaluation of Outcomes: Progressing   Progress in Treatment: Attending groups: No. Participating in groups: No. Taking medication as prescribed: Yes. Toleration medication: Yes. Family/Significant other contact made: No, will contact:  once permission is given. Patient understands diagnosis: Yes. Discussing patient identified problems/goals with staff: Yes. Medical problems stabilized or resolved: Yes. Denies  suicidal/homicidal ideation: Yes. Issues/concerns per patient self-inventory: No. Other: none  New problem(s) identified: No, Describe:  none  New Short Term/Long Term Goal(s): detox, elimination of symptoms of psychosis, medication management for mood stabilization; elimination of SI thoughts;  development of comprehensive mental wellness/sobriety plan.   Patient Goals:  "get back on my medicines"  Discharge Plan or Barriers: CSW will assist patient in developing appropriate discharge plans.  Patient reports that he is interested in [place].  CSW has scheduled the patient for a TROSA interview on 12/28/2021 at 3:30PM.  Reason for Continuation of Hospitalization: Anxiety Depression Medication stabilization Suicidal ideation  Estimated Length of Stay:  1-7 days   Scribe for Treatment Team: Rozann Lesches, LCSW 12/23/2021 11:44 AM

## 2021-12-23 NOTE — Progress Notes (Signed)
Pt slept throughout the night.

## 2021-12-23 NOTE — Progress Notes (Signed)
D: Pt alert and oriented. Pt rates depression 5/10, hopelessness 5/10, and anxiety 5/10. Pt goal: "Get some help." Pt reports energy level as normal and concentration as being good. Pt reports sleep last night as being good. Pt did receive medications for sleep and did find them helpful. Pt reports experiencing 8/10 Right leg pain at this time, pt refused pain management medication at this time. Pt denies experiencing any SI/HI, or AVH at this time.   A: Scheduled medications administered to pt, per MD orders. Support and encouragement provided. Frequent verbal contact made. Routine safety checks conducted q15 minutes.   R: No adverse drug reactions noted. Pt verbally contracts for safety at this time. Pt complaint with medications and treatment plan. Pt interacts minimally with others on the unit. Pt remains safe at this time. Will continue to monitor.

## 2021-12-23 NOTE — [facility] Counselor (Signed)
CSW spoke with [place] at [place], [phone].  Patient is scheduled for an interview on 12/28/2021 at 3:30PM.  This was the earliest appointment per Caryn Bee.  Penni Homans, MSW, LCSW 12/23/2021 11:52 AM

## 2021-12-23 NOTE — [facility] Suicide Risk Assessment (Signed)
[facility] INPATIENT:  Family/Significant Other Suicide Prevention Education  Suicide Prevention Education:  Education Completed; Whitt Auletta, mother, [phone] has been identified by the patient as the family member/significant other with whom the patient will be residing, and identified as the person(s) who will aid the patient in the event of a mental health crisis (suicidal ideations/suicide attempt).  With written consent from the patient, the family member/significant other has been provided the following suicide prevention education, prior to the and/or following the discharge of the patient.  The suicide prevention education provided includes the following: Suicide risk factors Suicide prevention and interventions National Suicide Hotline telephone number [facility] assessment telephone number [facility] Emergency Assistance 911 [facility] and/or Residential Mobile Crisis Unit telephone number  Request made of family/significant other to: Remove weapons (e.g., guns, rifles, knives), all items previously/currently identified as safety concern.   Remove drugs/medications (over-the-counter, prescriptions, illicit drugs), all items previously/currently identified as a safety concern.  The family member/significant other verbalizes understanding of the suicide prevention education information provided.  The family member/significant other agrees to remove the items of safety concern listed above.  CSW spoke with the patient's mother.  She reports that patient "was not himself".  Mother reports that patient DOES have access to weapons, she reports a knife "he be messing with".  She reports that the patient "be talking to himself" when asked if patient is a danger to self and others.  Mother reports that patient is NOT able to return to her home.  She reports that the patient had been living with her.  She reports "he needs help".    Harden Mo 12/23/2021, 11:53  AM

## 2021-12-24 LAB — LIPID PANEL
Cholesterol: 189 mg/dL (ref 0–200)
HDL: 50 mg/dL (ref >40)
LDL Cholesterol: 120 mg/dL — ABNORMAL HIGH (ref 0–99)
Total CHOL/HDL Ratio: 3.8 RATIO
Triglycerides: 95 mg/dL (ref <150)
VLDL: 19 mg/dL (ref 0–40)

## 2021-12-24 LAB — HEMOGLOBIN A1C
Hgb A1c MFr Bld: 5 % (ref 4.8–5.6)
Mean Plasma Glucose: 96.8 mg/dL

## 2021-12-24 NOTE — Progress Notes (Signed)
Isolative to room. Denies SI, HI and AVH. Refused trazodone. Said he did not need it

## 2021-12-24 NOTE — Group Note (Signed)
LCSW Group Therapy Note  Group Date: 12/24/2021 Start Time: 1300 End Time: 1400   Type of Therapy and Topic:  Group Therapy - Healthy vs Unhealthy Coping Skills  Participation Level:  Did Not Attend   Description of Group The focus of this group was to determine what unhealthy coping techniques typically are used by group members and what healthy coping techniques would be helpful in coping with various problems. Patients were guided in becoming aware of the differences between healthy and unhealthy coping techniques. Patients were asked to identify 2-3 healthy coping skills they would like to learn to use more effectively.  Therapeutic Goals Patients learned that coping is what human beings do all day long to deal with various situations in their lives Patients defined and discussed healthy vs unhealthy coping techniques Patients identified their preferred coping techniques and identified whether these were healthy or unhealthy Patients determined 2-3 healthy coping skills they would like to become more familiar with and use more often. Patients provided support and ideas to each other   Summary of Patient Progress: Patient did not attend group despite encouraged participation.    Therapeutic Modalities Cognitive Behavioral Therapy Motivational Interviewing  Norberto Sorenson, Theresia Majors 12/24/2021  4:12 PM

## 2021-12-24 NOTE — Plan of Care (Signed)
  Problem: Education: Goal: Knowledge of [place] [facility] Education information/materials will improve Outcome: Progressing Goal: Emotional status will improve Outcome: Progressing Goal: Mental status will improve Outcome: Progressing Goal: Verbalization of understanding the information provided will improve Outcome: Progressing   Problem: Activity: Goal: Interest or engagement in activities will improve Outcome: Progressing Goal: Sleeping patterns will improve Outcome: Progressing   Problem: Coping: Goal: Ability to verbalize frustrations and anger appropriately will improve Outcome: Progressing Goal: Ability to demonstrate self-control will improve Outcome: Progressing   Problem: Health Behavior/Discharge Planning: Goal: Identification of resources available to assist in meeting health care needs will improve Outcome: Progressing Goal: Compliance with treatment plan for underlying cause of condition will improve Outcome: Progressing   Problem: Physical Regulation: Goal: Ability to maintain clinical measurements within normal limits will improve Outcome: Progressing   Problem: Safety: Goal: Periods of time without injury will increase Outcome: Progressing   

## 2021-12-24 NOTE — Progress Notes (Signed)
D: Pt alert and oriented. Pt rates depression 4/10, hopelessness 4/10, and anxiety 4/10, however verbally denies all. Pt goal: "Get help." Pt reports energy level as normal and concentration as being good. Pt reports sleep last night as being good. Pt did not receive medications for sleep. Pt denies experiencing any pain at this time. Pt verbally denies experiencing any SI/HI, or AVH at this time however endorses SI on self inventory. Pt contracts for safety while here.   Pt has asked a few time during this shift, "Do you know what they're going to do with me." Pt has been anxious to start treatment. Pt asked for the phone number to [facility]. This writer looked up the number gave it to the pt as well as called it for the pt. No one was answering at this time.   Pt attended the NA class today. Pt has been more active in the milieu today in comparison to yesterday.   A: Scheduled medications administered to pt, per MD orders. Support and encouragement provided. Frequent verbal contact made. Routine safety checks conducted q15 minutes.   R: No adverse drug reactions noted. Pt verbally contracts for safety at this time. Pt complaint with medications and treatment plan. Pt interacts well with others on the unit. Pt remains safe at this time. Will continue to monitor.

## 2021-12-24 NOTE — Progress Notes (Signed)
[facility] MD Progress Note  12/24/2021 11:35 AM Dominic [name]  MRN:  [id number] Subjective: Follow-up 46 year old man with schizoaffective disorder and substance abuse disorder.  Patient seen and chart reviewed.  Patient has no complaints.  Vitals unremarkable.  Says his mood is better.  Hallucinations greatly improved.  Patient says he did not do any follow-up yesterday about looking for substance abuse rehab.  Chart reviewed.  Last lipid panel was in September no recent hemoglobin A1c. Principal Problem: Schizoaffective disorder, depressive type ([place]) Diagnosis: Principal Problem:   Schizoaffective disorder, depressive type ([place]) Active Problems:   Alcohol abuse   Cocaine abuse ([place])   Chronic hepatitis C without hepatic coma ([facility])   Nicotine dependence, cigarettes, uncomplicated  Total Time spent with patient: 30 minutes  Past Psychiatric History: Past history of schizoaffective disorder.  Also substance abuse with stimulant abuse especially.  Mood is better when on medications.  Past Medical History:  Past Medical History:  Diagnosis Date   Anxiety    Asthma    Bipolar 1 disorder ([place])    Chronic hepatitis C without hepatic coma ([place]) 05/28/2018   Depression    Hepatitis C    Hepatitis C antibody positive in blood 05/28/2018   Schizophrenia ([place])    Sleep apnea     Past Surgical History:  Procedure Laterality Date   CYST REMOVAL NECK     neck   Family History:  Family History  Problem Relation Age of Onset   Hypertension Sister    Schizophrenia Sister    Family Psychiatric  History: See previous Social History:  Social History   Substance and Sexual Activity  Alcohol Use Yes   Alcohol/week: 3.0 standard drinks   Types: 3 Shots of liquor per week     Social History   Substance and Sexual Activity  Drug Use Yes   Types: Cocaine, Marijuana   Comment: last used today    Social History   Socioeconomic History   Marital status: Single    Spouse name: Not on  file   Number of children: Not on file   Years of education: Not on file   Highest education level: Not on file  Occupational History   Not on file  Tobacco Use   Smoking status: Some Days    Packs/day: 1.00    Types: Cigarettes   Smokeless tobacco: Never  Vaping Use   Vaping Use: Never used  Substance and Sexual Activity   Alcohol use: Yes    Alcohol/week: 3.0 standard drinks    Types: 3 Shots of liquor per week   Drug use: Yes    Types: Cocaine, Marijuana    Comment: last used today   Sexual activity: Not Currently  Other Topics Concern   Not on file  Social History Narrative   Not on file   Social Determinants of Health   Financial Resource Strain: Not on file  Food Insecurity: Not on file  Transportation Needs: Not on file  Physical Activity: Not on file  Stress: Not on file  Social Connections: Not on file   Additional Social History:                         Sleep: Fair  Appetite:  Fair  Current Medications: Current Facility-Administered Medications  Medication Dose Route Frequency Provider Last Rate Last Admin   acetaminophen (TYLENOL) tablet 650 mg  650 mg Oral Q6H PRN Sherlon Handing, NP   650 mg at  12/22/21 0848   alum & mag hydroxide-simeth (MAALOX/MYLANTA) 200-200-20 MG/5ML suspension 30 mL  30 mL Oral Q4H PRN Waldon Merl F, NP       ARIPiprazole (ABILIFY) tablet 20 mg  20 mg Oral Daily Waldon Merl F, NP   20 mg at 12/24/21 [id number]   citalopram (CELEXA) tablet 20 mg  20 mg Oral Daily Waldon Merl F, NP   20 mg at 12/24/21 [id number]   ibuprofen (ADVIL) tablet 800 mg  800 mg Oral Q8H PRN Zackory Pudlo, [name], MD       magnesium hydroxide (MILK OF MAGNESIA) suspension 30 mL  30 mL Oral Daily PRN Waldon Merl F, NP       nicotine (NICODERM CQ - dosed in mg/24 hours) patch 21 mg  21 mg Transdermal Daily Waldon Merl F, NP       traZODone (DESYREL) tablet 100 mg  100 mg Oral QHS Waldon Merl F, NP   100 mg at 12/22/21 2128    Lab  Results: No results found for this or any previous visit (from the past 48 hour(s)).  Blood Alcohol level:  Lab Results  Component Value Date   ETH <10 12/21/2021   ETH <10 [id number]    Metabolic Disorder Labs: Lab Results  Component Value Date   HGBA1C 5.6 07/16/2021   MPG 114 07/16/2021   MPG 105.41 04/08/2018   No results found for: PROLACTIN Lab Results  Component Value Date   CHOL 194 08/03/2021   TRIG 99 08/03/2021   HDL 69 08/03/2021   CHOLHDL 2.8 08/03/2021   VLDL 12 07/16/2021   LDLCALC 107 (H) 08/03/2021   LDLCALC 98 07/16/2021    Physical Findings: AIMS:  , ,  ,  ,    CIWA:    COWS:     Musculoskeletal: Strength & Muscle Tone: within normal limits Gait & Station: normal Patient leans: N/A  Psychiatric Specialty Exam:  Presentation  General Appearance: Disheveled  Eye Contact:Good  Speech:Clear and Coherent  Speech Volume:Normal  Handedness:Right   Mood and Affect  Mood:Depressed; Anxious  Affect:Congruent   Thought Process  Thought Processes:Coherent  Descriptions of Associations:Intact  Orientation:Full (Time, Place and Person)  Thought Content:Logical  History of Schizophrenia/Schizoaffective disorder:Yes  Duration of Psychotic Symptoms:Greater than six months  Hallucinations:No data recorded Ideas of Reference:Delusions  Suicidal Thoughts:No data recorded Homicidal Thoughts:No data recorded  Sensorium  Memory:Immediate Good  Judgment:Poor  Insight:Fair   Executive Functions  Concentration:Fair  Attention Span:Fair  [place]   Psychomotor Activity  Psychomotor Activity:No data recorded  Assets  Assets:Desire for Improvement; Resilience   Sleep  Sleep:No data recorded   Physical Exam: Physical Exam Vitals and nursing note reviewed.  Constitutional:      Appearance: Normal appearance.  HENT:     Head: Normocephalic and atraumatic.     Mouth/Throat:      Pharynx: Oropharynx is clear.  Eyes:     Pupils: Pupils are equal, round, and reactive to light.  Cardiovascular:     Rate and Rhythm: Normal rate and regular rhythm.  Pulmonary:     Effort: Pulmonary effort is normal.     Breath sounds: Normal breath sounds.  Abdominal:     General: Abdomen is flat.     Palpations: Abdomen is soft.  Musculoskeletal:        General: Normal range of motion.  Skin:    General: Skin is warm and dry.  Neurological:     General: No  focal deficit present.     Mental Status: He is alert. Mental status is at baseline.  Psychiatric:        Mood and Affect: Mood normal.        Thought Content: Thought content normal.   Review of Systems  Constitutional: Negative.   HENT: Negative.    Eyes: Negative.   Respiratory: Negative.    Cardiovascular: Negative.   Gastrointestinal: Negative.   Musculoskeletal: Negative.   Skin: Negative.   Neurological: Negative.   Psychiatric/Behavioral: Negative.    Blood pressure 103/73, pulse (!) 55, temperature (!) 97.5 F (36.4 C), temperature source Oral, resp. rate 18, height 5\' 10"  (1.778 m), weight 80.7 kg, SpO2 98 %. Body mass index is 25.54 kg/m.   Treatment Plan Summary: Medication management and Plan no change to current medication.  Orders placed to check lipid panel and hemoglobin A1c.  Encourage patient to talk with social work and continue to pursue possible options for rehab programs.  Alethia Berthold, MD 12/24/2021, 11:35 AM

## 2021-12-25 NOTE — Plan of Care (Signed)
°  Problem: Education: Goal: Knowledge of [place] [facility] Education information/materials will improve Outcome: Progressing Goal: Emotional status will improve Outcome: Progressing Goal: Mental status will improve Outcome: Progressing Goal: Verbalization of understanding the information provided will improve Outcome: Progressing   Problem: Activity: Goal: Interest or engagement in activities will improve Outcome: Progressing Goal: Sleeping patterns will improve Outcome: Progressing   Problem: Coping: Goal: Ability to verbalize frustrations and anger appropriately will improve Outcome: Progressing Goal: Ability to demonstrate self-control will improve Outcome: Progressing   Problem: Health Behavior/Discharge Planning: Goal: Identification of resources available to assist in meeting health care needs will improve Outcome: Progressing Goal: Compliance with treatment plan for underlying cause of condition will improve Outcome: Progressing   Problem: Physical Regulation: Goal: Ability to maintain clinical measurements within normal limits will improve Outcome: Progressing   Problem: Safety: Goal: Periods of time without injury will increase Outcome: Progressing   Problem: Safety: Goal: Periods of time without injury will increase Outcome: Progressing

## 2021-12-25 NOTE — Progress Notes (Signed)
Patient has been calm and cooperative. Denies SI, HI and AVH. Out of bed more. Refused Trazodone

## 2021-12-25 NOTE — Plan of Care (Signed)
Patient appropriate with staff & peers. Patient stated that he is feeling better and his goal is to get into a rehab program. Patient called  [facility] rescue mission and stated that he got accepted. Patient denies SI,HI and AVH. Visible in the milieu. Appetite and energy level good. Support and encouragement given.

## 2021-12-25 NOTE — Progress Notes (Signed)
[facility] MD Progress Note  12/25/2021 10:53 AM Dominic [name]  MRN:  [id number] Subjective: Pump follow-up for this patient with schizoaffective disorder and substance abuse.  Patient seen and chart reviewed.  Patient has no complaints today.  Reports he is tolerating his medicine well and feels much better.  Denies having any hallucinations.  Does not appear to be disorganized or paranoid.  Still focused on wanting to work on a discharge plan for rehab.  He did ask me to call his girlfriend which I will do this afternoon. Principal Problem: Schizoaffective disorder, depressive type ([place]) Diagnosis: Principal Problem:   Schizoaffective disorder, depressive type ([place]) Active Problems:   Alcohol abuse   Cocaine abuse ([place])   Chronic hepatitis C without hepatic coma (HCC)   Nicotine dependence, cigarettes, uncomplicated  Total Time spent with patient: 30 minutes  Past Psychiatric History: Past history of similar conditions usually doing much better when sober and on medicine  Past Medical History:  Past Medical History:  Diagnosis Date   Anxiety    Asthma    Bipolar 1 disorder ([place])    Chronic hepatitis C without hepatic coma ([place]) 05/28/2018   Depression    Hepatitis C    Hepatitis C antibody positive in blood 05/28/2018   Schizophrenia ([place])    Sleep apnea     Past Surgical History:  Procedure Laterality Date   CYST REMOVAL NECK     neck   Family History:  Family History  Problem Relation Age of Onset   Hypertension Sister    Schizophrenia Sister    Family Psychiatric  History: See previous Social History:  Social History   Substance and Sexual Activity  Alcohol Use Yes   Alcohol/week: 3.0 standard drinks   Types: 3 Shots of liquor per week     Social History   Substance and Sexual Activity  Drug Use Yes   Types: Cocaine, Marijuana   Comment: last used today    Social History   Socioeconomic History   Marital status: Single    Spouse name: Not on file    Number of children: Not on file   Years of education: Not on file   Highest education level: Not on file  Occupational History   Not on file  Tobacco Use   Smoking status: Some Days    Packs/day: 1.00    Types: Cigarettes   Smokeless tobacco: Never  Vaping Use   Vaping Use: Never used  Substance and Sexual Activity   Alcohol use: Yes    Alcohol/week: 3.0 standard drinks    Types: 3 Shots of liquor per week   Drug use: Yes    Types: Cocaine, Marijuana    Comment: last used today   Sexual activity: Not Currently  Other Topics Concern   Not on file  Social History Narrative   Not on file   Social Determinants of Health   Financial Resource Strain: Not on file  Food Insecurity: Not on file  Transportation Needs: Not on file  Physical Activity: Not on file  Stress: Not on file  Social Connections: Not on file   Additional Social History:                         Sleep: Fair  Appetite:  Fair  Current Medications: Current Facility-Administered Medications  Medication Dose Route Frequency Provider Last Rate Last Admin   acetaminophen (TYLENOL) tablet 650 mg  650 mg Oral Q6H PRN Waldon Merl  F, NP   650 mg at 12/22/21 0848   alum & mag hydroxide-simeth (MAALOX/MYLANTA) 200-200-20 MG/5ML suspension 30 mL  30 mL Oral Q4H PRN Sherlon Handing, NP       ARIPiprazole (ABILIFY) tablet 20 mg  20 mg Oral Daily Waldon Merl F, NP   20 mg at 12/25/21 0755   citalopram (CELEXA) tablet 20 mg  20 mg Oral Daily Waldon Merl F, NP   20 mg at 12/25/21 0755   ibuprofen (ADVIL) tablet 800 mg  800 mg Oral Q8H PRN Dakwon Wenberg, [name], MD       magnesium hydroxide (MILK OF MAGNESIA) suspension 30 mL  30 mL Oral Daily PRN Sherlon Handing, NP       nicotine (NICODERM CQ - dosed in mg/24 hours) patch 21 mg  21 mg Transdermal Daily Waldon Merl F, NP       traZODone (DESYREL) tablet 100 mg  100 mg Oral QHS Waldon Merl F, NP   100 mg at 12/22/21 2128    Lab Results:   Results for orders placed or performed during the hospital encounter of 12/21/21 (from the past 48 hour(s))  Hemoglobin A1c     Status: None   Collection Time: 12/24/21  2:07 PM  Result Value Ref Range   Hgb A1c MFr Bld 5.0 4.8 - 5.6 %    Comment: (NOTE) Pre diabetes:          5.7%-6.4%  Diabetes:              >6.4%  Glycemic control for   <7.0% adults with diabetes    Mean Plasma Glucose 96.8 mg/dL    Comment: Performed at [facility], [place] [address], [place], [place] [address]  Lipid panel     Status: Abnormal   Collection Time: 12/24/21  2:07 PM  Result Value Ref Range   Cholesterol 189 0 - 200 mg/dL   Triglycerides 95 <150 mg/dL   HDL 50 >40 mg/dL   Total CHOL/HDL Ratio 3.8 RATIO   VLDL 19 0 - 40 mg/dL   LDL Cholesterol 120 (H) 0 - 99 mg/dL    Comment:        Total Cholesterol/HDL:CHD Risk Coronary Heart Disease Risk Table                     Men   Women  1/2 Average Risk   3.4   3.3  Average Risk       5.0   4.4  2 X Average Risk   9.6   7.1  3 X Average Risk  23.4   11.0        Use the calculated Patient Ratio above and the CHD Risk Table to determine the patient's CHD Risk.        ATP III CLASSIFICATION (LDL):  <100     mg/dL   Optimal  100-129  mg/dL   Near or Above                    Optimal  130-159  mg/dL   Borderline  160-189  mg/dL   High  >190     mg/dL   Very High Performed at [facility], [address], [place], [place] [address]     Blood Alcohol level:  Lab Results  Component Value Date   [facility] <10 12/21/2021   ETH <10 [id number]    Metabolic Disorder Labs: Lab Results  Component Value  Date   HGBA1C 5.0 12/24/2021   MPG 96.8 12/24/2021   MPG 114 07/16/2021   No results found for: PROLACTIN Lab Results  Component Value Date   CHOL 189 12/24/2021   TRIG 95 12/24/2021   HDL 50 12/24/2021   CHOLHDL 3.8 12/24/2021   VLDL 19 12/24/2021   LDLCALC 120 (H) 12/24/2021   LDLCALC 107 (H) 08/03/2021    Physical  Findings: AIMS:  , ,  ,  ,    CIWA:    COWS:     Musculoskeletal: Strength & Muscle Tone: within normal limits Gait & Station: normal Patient leans: N/A  Psychiatric Specialty Exam:  Presentation  General Appearance: Disheveled  Eye Contact:Good  Speech:Clear and Coherent  Speech Volume:Normal  Handedness:Right   Mood and Affect  Mood:Depressed; Anxious  Affect:Congruent   Thought Process  Thought Processes:Coherent  Descriptions of Associations:Intact  Orientation:Full (Time, Place and Person)  Thought Content:Logical  History of Schizophrenia/Schizoaffective disorder:Yes  Duration of Psychotic Symptoms:Greater than six months  Hallucinations:No data recorded Ideas of Reference:Delusions  Suicidal Thoughts:No data recorded Homicidal Thoughts:No data recorded  Sensorium  Memory:Immediate Good  Judgment:Poor  Insight:Fair   Executive Functions  Concentration:Fair  Attention Span:Fair  [place]   Psychomotor Activity  Psychomotor Activity:No data recorded  Assets  Assets:Desire for Improvement; Resilience   Sleep  Sleep:No data recorded   Physical Exam: Physical Exam Vitals and nursing note reviewed.  Constitutional:      Appearance: Normal appearance.  HENT:     Head: Normocephalic and atraumatic.     Mouth/Throat:     Pharynx: Oropharynx is clear.  Eyes:     Pupils: Pupils are equal, round, and reactive to light.  Cardiovascular:     Rate and Rhythm: Normal rate and regular rhythm.  Pulmonary:     Effort: Pulmonary effort is normal.     Breath sounds: Normal breath sounds.  Abdominal:     General: Abdomen is flat.     Palpations: Abdomen is soft.  Musculoskeletal:        General: Normal range of motion.  Skin:    General: Skin is warm and dry.  Neurological:     General: No focal deficit present.     Mental Status: He is alert. Mental status is at baseline.  Psychiatric:         Mood and Affect: Mood normal.        Thought Content: Thought content normal.   Review of Systems  Constitutional: Negative.   HENT: Negative.    Eyes: Negative.   Respiratory: Negative.    Cardiovascular: Negative.   Gastrointestinal: Negative.   Musculoskeletal: Negative.   Skin: Negative.   Neurological: Negative.   Psychiatric/Behavioral: Negative.    Blood pressure 106/78, pulse (!) 51, temperature (!) 97.5 F (36.4 C), temperature source Oral, resp. rate 18, height 5\' 10"  (1.778 m), weight 80.7 kg, SpO2 96 %. Body mass index is 25.54 kg/m.   Treatment Plan Summary: Medication management and Plan doing well.  Tolerating medicine well.  We will work tomorrow on identifying what rehab programs and outpatient treatments are available and trying to get him more aggressively oriented towards discharge.  Alethia Berthold, MD 12/25/2021, 10:53 AM

## 2021-12-25 NOTE — Plan of Care (Signed)
  Problem: Education: Goal: Knowledge of [facility] Education information/materials will improve Outcome: Progressing Goal: Emotional status will improve Outcome: Progressing Goal: Mental status will improve Outcome: Progressing Goal: Verbalization of understanding the information provided will improve Outcome: Progressing   Problem: Activity: Goal: Interest or engagement in activities will improve Outcome: Progressing Goal: Sleeping patterns will improve Outcome: Progressing   Problem: Coping: Goal: Ability to verbalize frustrations and anger appropriately will improve Outcome: Progressing Goal: Ability to demonstrate self-control will improve Outcome: Progressing   Problem: Health Behavior/Discharge Planning: Goal: Identification of resources available to assist in meeting health care needs will improve Outcome: Progressing Goal: Compliance with treatment plan for underlying cause of condition will improve Outcome: Progressing   Problem: Physical Regulation: Goal: Ability to maintain clinical measurements within normal limits will improve Outcome: Progressing   Problem: Safety: Goal: Periods of time without injury will increase Outcome: Progressing   

## 2021-12-25 NOTE — Progress Notes (Signed)
Pt  visible on the unit, minimal interaction with with peers and staff. Pt denies SI/HI and AVH. Pt stated he was being discharged tomorrow. Pt calm and pleasant on approach. No complaints voiced. Pt watched TV for a little and then went to bed around 930 pm. No signs of emotional distress.

## 2021-12-26 ENCOUNTER — Other Ambulatory Visit: Payer: Self-pay

## 2021-12-26 MED ORDER — TRAZODONE HCL 100 MG PO TABS: 100.0000 mg | ORAL_TABLET | Freq: Every day | ORAL | 1 refills | Status: DC

## 2021-12-26 MED ORDER — NICOTINE 21 MG/24HR TD PT24
21.0000 mg | MEDICATED_PATCH | Freq: Every day | TRANSDERMAL | 0 refills | Status: DC
Start: 2021-12-26 — End: 2021-12-26
  Filled 2021-12-26: qty 14, 14d supply, fill #0

## 2021-12-26 MED ORDER — NICOTINE 21 MG/24HR TD PT24: 21.0000 mg | MEDICATED_PATCH | Freq: Every day | TRANSDERMAL | 1 refills | Status: DC

## 2021-12-26 MED ORDER — TRAZODONE HCL 100 MG PO TABS
100.0000 mg | ORAL_TABLET | Freq: Every day | ORAL | 0 refills | Status: DC
  Filled 2021-12-26: qty 10, 10d supply, fill #0

## 2021-12-26 MED ORDER — ARIPIPRAZOLE 20 MG PO TABS
20.0000 mg | ORAL_TABLET | Freq: Every day | ORAL | 0 refills | Status: DC
  Filled 2021-12-26: qty 10, 10d supply, fill #0

## 2021-12-26 MED ORDER — CITALOPRAM HYDROBROMIDE 20 MG PO TABS
20.0000 mg | ORAL_TABLET | Freq: Every day | ORAL | 0 refills | Status: DC
  Filled 2021-12-26: qty 10, 10d supply, fill #0

## 2021-12-26 MED ORDER — CITALOPRAM HYDROBROMIDE 20 MG PO TABS: 20.0000 mg | ORAL_TABLET | Freq: Every day | ORAL | 1 refills | Status: DC

## 2021-12-26 MED ORDER — ARIPIPRAZOLE ER 400 MG IM SRER: 400.0000 mg | INTRAMUSCULAR | 3 refills | Status: DC

## 2021-12-26 MED ORDER — ARIPIPRAZOLE 20 MG PO TABS: 20.0000 mg | ORAL_TABLET | Freq: Every day | ORAL | 1 refills | Status: DC

## 2021-12-26 NOTE — Progress Notes (Signed)
Patient denies SI, HI, and AVH. He was educated on prescriptions and follow up care. Pt questions were answered and pt verbalized understanding and did not voice any concerns. Pt's belongings were returned and patient was given supply of medicine. [facility]. Patient was not observed to be in any distress at time of discharge.

## 2021-12-26 NOTE — Progress Notes (Signed)
°  [facility] Adult Case Management Discharge Plan :  Will you be returning to the same living situation after discharge:  No.  Pt to be discharged to [facility]. At discharge, do you have transportation home?: Yes,  CSW will assist with transportation needs. Do you have the ability to pay for your medications: Yes,  [employer]  Release of information consent forms completed and in the chart;  Patient's signature needed at discharge.  Patient to Follow up at:  Follow-up Information     [facility] Follow up.   Why: You are scheduled to arrive at [facility] to pursue the [employer]. Contact information: [address]  [place], [place] [address] Phone: [phone]                Next level of care provider has access to [facility] Link:no  Safety Planning and Suicide Prevention discussed: Yes,  SPE completed with the patients mother.      Has patient been referred to the Quitline?: Patient refused referral  Patient has been referred for addiction treatment: Yes  Harden Mo, LCSW 12/26/2021, 9:09 AM

## 2021-12-26 NOTE — Discharge Summary (Signed)
Physician Discharge Summary Note  Patient:  Dominic [name] is an 46 y.o., male MRN:  [id number] DOB:  03-Jul-1976 Patient phone:  [phone] (home)  Patient address:   [address] [place] [address],  Total Time spent with patient: 30 minutes  Date of Admission:  12/21/2021 Date of Discharge: 12/26/2021  Reason for Admission: Admitted to the hospital because of return of psychotic symptoms mood instability in the context of substance abuse  Principal Problem: Schizoaffective disorder, depressive type [facility]) Discharge Diagnoses: Principal Problem:   Schizoaffective disorder, depressive type (HCC) Active Problems:   Alcohol abuse   Cocaine abuse (HCC)   Chronic hepatitis C without hepatic coma (HCC)   Nicotine dependence, cigarettes, uncomplicated   Past Psychiatric History: Patient has a long history of chronic mood instability and psychotic symptoms as well as chronic substance abuse.  Diagnosis of bipolar disorder.  Past Medical History:  Past Medical History:  Diagnosis Date   Anxiety    Asthma    Bipolar 1 disorder (HCC)    Chronic hepatitis C without hepatic coma (HCC) 05/28/2018   Depression    Hepatitis C    Hepatitis C antibody positive in blood 05/28/2018   Schizophrenia (HCC)    Sleep apnea     Past Surgical History:  Procedure Laterality Date   CYST REMOVAL NECK     neck   Family History:  Family History  Problem Relation Age of Onset   Hypertension Sister    Schizophrenia Sister    Family Psychiatric  History: See previous Social History:  Social History   Substance and Sexual Activity  Alcohol Use Yes   Alcohol/week: 3.0 standard drinks   Types: 3 Shots of liquor per week     Social History   Substance and Sexual Activity  Drug Use Yes   Types: Cocaine, Marijuana   Comment: last used today    Social History   Socioeconomic History   Marital status: Single    Spouse name: Not on file   Number of children: Not on file   Years of  education: Not on file   Highest education level: Not on file  Occupational History   Not on file  Tobacco Use   Smoking status: Some Days    Packs/day: 1.00    Types: Cigarettes   Smokeless tobacco: Never  Vaping Use   Vaping Use: Never used  Substance and Sexual Activity   Alcohol use: Yes    Alcohol/week: 3.0 standard drinks    Types: 3 Shots of liquor per week   Drug use: Yes    Types: Cocaine, Marijuana    Comment: last used today   Sexual activity: Not Currently  Other Topics Concern   Not on file  Social History Narrative   Not on file   Social Determinants of Health   Financial Resource Strain: Not on file  Food Insecurity: Not on file  Transportation Needs: Not on file  Physical Activity: Not on file  Stress: Not on file  Social Connections: Not on file    Hospital Course: Admitted to the psychiatric unit.  Started back on medications including Abilify injection oral Abilify and citalopram.  Tolerated medicines well.  Patient had no complaints.  Rapidly returned to his mental status baseline with resolution of hallucinations.  Affect became more upbeat and positive.  Did not display any dangerous behavior while on the unit.  Patient was seeking residential treatment which has been found at the [facility].  Patient  will be discharged today to following up to continue [facility].  Physical Findings: AIMS:  , ,  ,  ,    CIWA:    COWS:     Musculoskeletal: Strength & Muscle Tone: within normal limits Gait & Station: normal Patient leans: N/A   Psychiatric Specialty Exam:  Presentation  General Appearance: Disheveled  Eye Contact:Good  Speech:Clear and Coherent  Speech Volume:Normal  Handedness:Right   Mood and Affect  Mood:Depressed; Anxious  Affect:Congruent   Thought Process  Thought Processes:Coherent  Descriptions of Associations:Intact  Orientation:Full (Time, Place and Person)  Thought  Content:Logical  History of Schizophrenia/Schizoaffective disorder:Yes  Duration of Psychotic Symptoms:Greater than six months  Hallucinations:No data recorded Ideas of Reference:Delusions  Suicidal Thoughts:No data recorded Homicidal Thoughts:No data recorded  Sensorium  Memory:Immediate Good  Judgment:Poor  Insight:Fair   Executive Functions  Concentration:Fair  Attention Span:Fair  Recall:Fair  Fund of Knowledge:Fair  Language:Fair   Psychomotor Activity  Psychomotor Activity:No data recorded  Assets  Assets:Desire for Improvement; Resilience   Sleep  Sleep:No data recorded   Physical Exam: Physical Exam Vitals and nursing note reviewed.  Constitutional:      Appearance: Normal appearance.  HENT:     Head: Normocephalic and atraumatic.     Mouth/Throat:     Pharynx: Oropharynx is clear.  Eyes:     Pupils: Pupils are equal, round, and reactive to light.  Cardiovascular:     Rate and Rhythm: Normal rate and regular rhythm.  Pulmonary:     Effort: Pulmonary effort is normal.     Breath sounds: Normal breath sounds.  Abdominal:     General: Abdomen is flat.     Palpations: Abdomen is soft.  Musculoskeletal:        General: Normal range of motion.  Skin:    General: Skin is warm and dry.  Neurological:     General: No focal deficit present.     Mental Status: He is alert. Mental status is at baseline.  Psychiatric:        Mood and Affect: Mood normal.        Thought Content: Thought content normal.   Review of Systems  Constitutional: Negative.   HENT: Negative.    Eyes: Negative.   Respiratory: Negative.    Cardiovascular: Negative.   Gastrointestinal: Negative.   Musculoskeletal: Negative.   Skin: Negative.   Neurological: Negative.   Psychiatric/Behavioral: Negative.    Blood pressure 120/72, pulse 63, temperature 97.6 F (36.4 C), temperature source Oral, resp. rate 18, height 5\' 10"  (1.778 m), weight 80.7 kg, SpO2 99 %. Body mass  index is 25.54 kg/m.   Social History   Tobacco Use  Smoking Status Some Days   Packs/day: 1.00   Types: Cigarettes  Smokeless Tobacco Never   Tobacco Cessation:  A prescription for an FDA-approved tobacco cessation medication provided at discharge   Blood Alcohol level:  Lab Results  Component Value Date   ETH <10 12/21/2021   ETH <10 08/05/2021    Metabolic Disorder Labs:  Lab Results  Component Value Date   HGBA1C 5.0 12/24/2021   MPG 96.8 12/24/2021   MPG 114 07/16/2021   No results found for: PROLACTIN Lab Results  Component Value Date   CHOL 189 12/24/2021   TRIG 95 12/24/2021   HDL 50 12/24/2021   CHOLHDL 3.8 12/24/2021   VLDL 19 12/24/2021   LDLCALC 120 (H) 12/24/2021   LDLCALC 107 (H) 08/03/2021    See Psychiatric Specialty  Exam and Suicide Risk Assessment completed by Attending Physician prior to discharge.  Discharge destination:  Home  Is patient on multiple antipsychotic therapies at discharge:  No   Has Patient had three or more failed trials of antipsychotic monotherapy by history:  No  Recommended Plan for Multiple Antipsychotic Therapies: NA  Discharge Instructions     Diet - low sodium heart healthy   Complete by: As directed    Increase activity slowly   Complete by: As directed       Allergies as of 12/26/2021   No Known Allergies      Medication List     STOP taking these medications    diphenhydrAMINE 50 MG tablet Commonly known as: BENADRYL   lithium carbonate 300 MG CR tablet Commonly known as: LITHOBID   mirtazapine 7.5 MG tablet Commonly known as: REMERON   naltrexone 50 MG tablet Commonly known as: DEPADE   naproxen 500 MG tablet Commonly known as: Naprosyn   omeprazole 40 MG capsule Commonly known as: PRILOSEC   pyridOXINE 50 MG tablet Commonly known as: VITAMIN B-6   QUEtiapine 300 MG tablet Commonly known as: SEROQUEL   thiamine 100 MG tablet       TAKE these medications      Indication   ARIPiprazole ER 400 MG Srer injection Commonly known as: ABILIFY MAINTENA Inject 2 mLs (400 mg total) into the muscle every 28 (twenty-eight) days.  Indication: Manic-Depression   ARIPiprazole 20 MG tablet Commonly known as: ABILIFY Take 1 tablet (20 mg total) by mouth daily.  Indication: Manic Phase of Manic-Depression, Schizophrenia   citalopram 20 MG tablet Commonly known as: CELEXA Take 1 tablet (20 mg total) by mouth daily.  Indication: Major Depressive Disorder   nicotine 21 mg/24hr patch Commonly known as: NICODERM CQ - dosed in mg/24 hours Place 1 patch (21 mg total) onto the skin daily. What changed: how to take this  Indication: Nicotine Addiction   traZODone 100 MG tablet Commonly known as: DESYREL Take 1 tablet (100 mg total) by mouth at bedtime.  Indication: Trouble Sleeping        Follow-up Information     [facility] Follow up.   Why: You are scheduled to arrive at [facility] to pursue the [employer]. Contact information: [address]  [place], [place] [address] Phone: [phone]                Follow-up recommendations: Continue medication as prescribed.  Follow-up [facility].  Avoid drug use.  Comments: 10-day supply ordered at discharge as well  Signed: Mordecai Rasmussen, MD 12/26/2021, 10:17 AM

## 2021-12-26 NOTE — [facility] Suicide Risk Assessment (Signed)
[facility] Discharge Suicide Risk Assessment   Principal Problem: Schizoaffective disorder, depressive type (HCC) Discharge Diagnoses: Principal Problem:   Schizoaffective disorder, depressive type (HCC) Active Problems:   Alcohol abuse   Cocaine abuse (HCC)   Chronic hepatitis C without hepatic coma (HCC)   Nicotine dependence, cigarettes, uncomplicated   Total Time spent with patient: 30 minutes  Musculoskeletal: Strength & Muscle Tone: within normal limits Gait & Station: normal Patient leans: N/A  Psychiatric Specialty Exam  Presentation  General Appearance: Disheveled  Eye Contact:Good  Speech:Clear and Coherent  Speech Volume:Normal  Handedness:Right   Mood and Affect  Mood:Depressed; Anxious  Duration of Depression Symptoms: Greater than two weeks  Affect:Congruent   Thought Process  Thought Processes:Coherent  Descriptions of Associations:Intact  Orientation:Full (Time, Place and Person)  Thought Content:Logical  History of Schizophrenia/Schizoaffective disorder:Yes  Duration of Psychotic Symptoms:Greater than six months  Hallucinations:No data recorded Ideas of Reference:Delusions  Suicidal Thoughts:No data recorded Homicidal Thoughts:No data recorded  Sensorium  Memory:Immediate Good  Judgment:Poor  Insight:Fair   Executive Functions  Concentration:Fair  Attention Span:Fair  Recall:Fair  Fund of Knowledge:Fair  Language:Fair   Psychomotor Activity  Psychomotor Activity:No data recorded  Assets  Assets:Desire for Improvement; Resilience   Sleep  Sleep:No data recorded  Physical Exam: Physical Exam Vitals and nursing note reviewed.  Constitutional:      Appearance: Normal appearance.  HENT:     Head: Normocephalic and atraumatic.     Mouth/Throat:     Pharynx: Oropharynx is clear.  Eyes:     Pupils: Pupils are equal, round, and reactive to light.  Cardiovascular:     Rate and Rhythm: Normal rate and regular  rhythm.  Pulmonary:     Effort: Pulmonary effort is normal.     Breath sounds: Normal breath sounds.  Abdominal:     General: Abdomen is flat.     Palpations: Abdomen is soft.  Musculoskeletal:        General: Normal range of motion.  Skin:    General: Skin is warm and dry.  Neurological:     General: No focal deficit present.     Mental Status: He is alert. Mental status is at baseline.  Psychiatric:        Mood and Affect: Mood normal.        Thought Content: Thought content normal.   Review of Systems  Constitutional: Negative.   HENT: Negative.    Eyes: Negative.   Respiratory: Negative.    Cardiovascular: Negative.   Gastrointestinal: Negative.   Musculoskeletal: Negative.   Skin: Negative.   Neurological: Negative.   Psychiatric/Behavioral: Negative.    Blood pressure 120/72, pulse 63, temperature 97.6 F (36.4 C), temperature source Oral, resp. rate 18, height 5\' 10"  (1.778 m), weight 80.7 kg, SpO2 99 %. Body mass index is 25.54 kg/m.  Mental Status Per Nursing Assessment::   On Admission:  Suicidal ideation indicated by patient  Demographic Factors:  Male and Unemployed  Loss Factors: Financial problems/change in socioeconomic status  Historical Factors: Impulsivity  Risk Reduction Factors:   Positive social support, Positive therapeutic relationship, and Positive coping skills or problem solving skills  Continued Clinical Symptoms:  Alcohol/Substance Abuse/Dependencies Schizophrenia:   Paranoid or undifferentiated type  Cognitive Features That Contribute To Risk:  None    Suicide Risk:  Minimal: No identifiable suicidal ideation.  Patients presenting with no risk factors but with morbid ruminations; may be classified as minimal risk based on the severity of the depressive symptoms  Follow-up Information     [employer] Follow up.   Why: You are scheduled to arrive at [facility] to pursue the [employer]. Contact  information: [address]  [place], [place] [address] Phone: [phone]                Plan Of Care/Follow-up recommendations:  No suicidal ideation and no behavioral problem. Going to residential and upbeat about it  Mordecai Rasmussen, MD 12/26/2021, 9:13 AM

## 2022-01-07 ENCOUNTER — Other Ambulatory Visit: Payer: Self-pay

## 2022-01-07 ENCOUNTER — Emergency Department (HOSPITAL_COMMUNITY)
Admission: EM | Admit: 2022-01-07 | Discharge: 2022-01-07 | Disposition: A | Discharge status: Home / Self Care | Payer: No Typology Code available for payment source | Attending: Emergency Medicine | Admitting: Emergency Medicine

## 2022-01-07 ENCOUNTER — Encounter (HOSPITAL_COMMUNITY): Payer: Self-pay

## 2022-01-07 DIAGNOSIS — Z20822 Contact with and (suspected) exposure to covid-19: Secondary | ICD-10-CM | POA: Diagnosis not present

## 2022-01-07 DIAGNOSIS — R45851 Suicidal ideations: Secondary | ICD-10-CM | POA: Diagnosis not present

## 2022-01-07 DIAGNOSIS — F411 Generalized anxiety disorder: Secondary | ICD-10-CM

## 2022-01-07 DIAGNOSIS — F99 Mental disorder, not otherwise specified: Secondary | ICD-10-CM

## 2022-01-07 DIAGNOSIS — F209 Schizophrenia, unspecified: Secondary | ICD-10-CM | POA: Diagnosis present

## 2022-01-07 DIAGNOSIS — F5105 Insomnia due to other mental disorder: Secondary | ICD-10-CM

## 2022-01-07 LAB — COMPREHENSIVE METABOLIC PANEL
ALT: 42 U/L (ref 0–44)
AST: 36 U/L (ref 15–41)
Albumin: 4 g/dL (ref 3.5–5.0)
Alkaline Phosphatase: 43 U/L (ref 38–126)
Anion gap: 5 (ref 5–15)
BUN: 11 mg/dL (ref 6–20)
CO2: 24 mmol/L (ref 22–32)
Calcium: 8.9 mg/dL (ref 8.9–10.3)
Chloride: 107 mmol/L (ref 98–111)
Creatinine, Ser: 0.73 mg/dL (ref 0.61–1.24)
GFR, Estimated: >60 mL/min (ref >60)
Glucose, Bld: 95 mg/dL (ref 70–99)
Potassium: 3.5 mmol/L (ref 3.5–5.1)
Sodium: 136 mmol/L (ref 135–145)
Total Bilirubin: 0.7 mg/dL (ref 0.3–1.2)
Total Protein: 7.6 g/dL (ref 6.5–8.1)

## 2022-01-07 LAB — CBC
HCT: 36.3 % — ABNORMAL LOW (ref 39.0–52.0)
Hemoglobin: 13.2 g/dL (ref 13.0–17.0)
MCH: 29.5 pg (ref 26.0–34.0)
MCHC: 36.4 g/dL — ABNORMAL HIGH (ref 30.0–36.0)
MCV: 81 fL (ref 80.0–100.0)
Platelets: 202 10*3/uL (ref 150–400)
RBC: 4.48 MIL/uL (ref 4.22–5.81)
RDW: 14.7 % (ref 11.5–15.5)
WBC: 9.1 10*3/uL (ref 4.0–10.5)
nRBC: 0 % (ref 0.0–0.2)

## 2022-01-07 LAB — RAPID URINE DRUG SCREEN, HOSP PERFORMED
Amphetamines: POSITIVE — AB
Barbiturates: NOT DETECTED
Benzodiazepines: NOT DETECTED
Cocaine: POSITIVE — AB
Opiates: NOT DETECTED
Tetrahydrocannabinol: POSITIVE — AB

## 2022-01-07 LAB — ETHANOL: Alcohol, Ethyl (B): <10 mg/dL (ref <10)

## 2022-01-07 LAB — RESP PANEL BY RT-PCR (FLU A&B, COVID) ARPGX2
Influenza A by PCR: NEGATIVE
Influenza B by PCR: NEGATIVE
SARS Coronavirus 2 by RT PCR: NEGATIVE

## 2022-01-07 MED ORDER — ARIPIPRAZOLE 20 MG PO TABS: 20.0000 mg | ORAL_TABLET | Freq: Every day | ORAL | 0 refills | Status: DC

## 2022-01-07 MED ORDER — TRAZODONE HCL 100 MG PO TABS: 100.0000 mg | ORAL_TABLET | Freq: Every day | ORAL | 0 refills | Status: DC

## 2022-01-07 MED ORDER — CITALOPRAM HYDROBROMIDE 20 MG PO TABS: 20.0000 mg | ORAL_TABLET | Freq: Every day | ORAL | 0 refills | Status: DC

## 2022-01-07 NOTE — BH Assessment (Signed)
Clinician messaged Junior Arsenio Katz, RN: "Hey. It's Trey with TTS. Is the pt able to engage in the assessment, if so the pt will need to be placed in a private room. Also is the pt under IVC?"   Clinician awaiting response.    Redmond Pulling, MS, [facility], [facility] Triage Specialist [phone]
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
## 2022-01-07 NOTE — ED Notes (Signed)
Patient changed out into burgandy scrubs. Patient has 2 belonging bags at the nurse station across from room 4. Patient wanded by security.

## 2022-01-09 ENCOUNTER — Emergency Department (HOSPITAL_COMMUNITY)
Admission: EM | Admit: 2022-01-09 | Discharge: 2022-01-09 | Disposition: A | Discharge status: Home / Self Care | Payer: No Typology Code available for payment source | Attending: Emergency Medicine | Admitting: Emergency Medicine

## 2022-01-09 ENCOUNTER — Other Ambulatory Visit: Payer: Self-pay

## 2022-01-09 ENCOUNTER — Encounter (HOSPITAL_COMMUNITY): Payer: Self-pay

## 2022-01-09 DIAGNOSIS — Z5900 Homelessness unspecified: Secondary | ICD-10-CM | POA: Insufficient documentation

## 2022-01-09 DIAGNOSIS — R451 Restlessness and agitation: Secondary | ICD-10-CM | POA: Insufficient documentation

## 2022-01-09 DIAGNOSIS — Z20822 Contact with and (suspected) exposure to covid-19: Secondary | ICD-10-CM | POA: Diagnosis not present

## 2022-01-09 DIAGNOSIS — F1910 Other psychoactive substance abuse, uncomplicated: Secondary | ICD-10-CM | POA: Insufficient documentation

## 2022-01-09 DIAGNOSIS — F251 Schizoaffective disorder, depressive type: Secondary | ICD-10-CM | POA: Diagnosis not present

## 2022-01-09 DIAGNOSIS — R45851 Suicidal ideations: Secondary | ICD-10-CM | POA: Insufficient documentation

## 2022-01-09 LAB — CBC WITH DIFFERENTIAL/PLATELET
Abs Immature Granulocytes: 0.02 10*3/uL (ref 0.00–0.07)
Basophils Absolute: 0 10*3/uL (ref 0.0–0.1)
Basophils Relative: 0 %
Eosinophils Absolute: 0 10*3/uL (ref 0.0–0.5)
Eosinophils Relative: 0 %
HCT: 36.5 % — ABNORMAL LOW (ref 39.0–52.0)
Hemoglobin: 13.2 g/dL (ref 13.0–17.0)
Immature Granulocytes: 0 %
Lymphocytes Relative: 33 %
Lymphs Abs: 2.9 10*3/uL (ref 0.7–4.0)
MCH: 29.2 pg (ref 26.0–34.0)
MCHC: 36.2 g/dL — ABNORMAL HIGH (ref 30.0–36.0)
MCV: 80.8 fL (ref 80.0–100.0)
Monocytes Absolute: 0.6 10*3/uL (ref 0.1–1.0)
Monocytes Relative: 7 %
Neutro Abs: 5.3 10*3/uL (ref 1.7–7.7)
Neutrophils Relative %: 60 %
Platelets: 200 10*3/uL (ref 150–400)
RBC: 4.52 MIL/uL (ref 4.22–5.81)
RDW: 14.6 % (ref 11.5–15.5)
WBC: 8.9 10*3/uL (ref 4.0–10.5)
nRBC: 0 % (ref 0.0–0.2)

## 2022-01-09 LAB — RESP PANEL BY RT-PCR (FLU A&B, COVID) ARPGX2
Influenza A by PCR: NEGATIVE
Influenza B by PCR: NEGATIVE
SARS Coronavirus 2 by RT PCR: NEGATIVE

## 2022-01-09 LAB — COMPREHENSIVE METABOLIC PANEL
ALT: 37 U/L (ref 0–44)
AST: 38 U/L (ref 15–41)
Albumin: 4.2 g/dL (ref 3.5–5.0)
Alkaline Phosphatase: 40 U/L (ref 38–126)
Anion gap: 6 (ref 5–15)
BUN: 17 mg/dL (ref 6–20)
CO2: 23 mmol/L (ref 22–32)
Calcium: 8.8 mg/dL — ABNORMAL LOW (ref 8.9–10.3)
Chloride: 105 mmol/L (ref 98–111)
Creatinine, Ser: 0.74 mg/dL (ref 0.61–1.24)
GFR, Estimated: >60 mL/min (ref >60)
Glucose, Bld: 127 mg/dL — ABNORMAL HIGH (ref 70–99)
Potassium: 3.3 mmol/L — ABNORMAL LOW (ref 3.5–5.1)
Sodium: 134 mmol/L — ABNORMAL LOW (ref 135–145)
Total Bilirubin: 1.6 mg/dL — ABNORMAL HIGH (ref 0.3–1.2)
Total Protein: 8 g/dL (ref 6.5–8.1)

## 2022-01-09 LAB — ETHANOL: Alcohol, Ethyl (B): <10 mg/dL (ref <10)

## 2022-01-09 LAB — SALICYLATE LEVEL: Salicylate Lvl: <7 mg/dL — ABNORMAL LOW (ref 7.0–30.0)

## 2022-01-09 LAB — ACETAMINOPHEN LEVEL: Acetaminophen (Tylenol), Serum: <10 ug/mL — ABNORMAL LOW (ref 10–30)

## 2022-01-09 MED ORDER — ARIPIPRAZOLE 10 MG PO TABS
20.0000 mg | ORAL_TABLET | Freq: Every day | ORAL | Status: DC
  Administered 2022-01-09: 20 mg via ORAL
  Filled 2022-01-09: qty 2

## 2022-01-09 MED ORDER — CITALOPRAM HYDROBROMIDE 10 MG PO TABS
20.0000 mg | ORAL_TABLET | Freq: Every day | ORAL | Status: DC
  Administered 2022-01-09: 20 mg via ORAL
  Filled 2022-01-09: qty 2

## 2022-01-09 MED ORDER — POTASSIUM CHLORIDE CRYS ER 20 MEQ PO TBCR: 40.0000 meq | EXTENDED_RELEASE_TABLET | Freq: Once | ORAL | Status: DC

## 2022-01-09 MED ORDER — TRAZODONE HCL 100 MG PO TABS
100.0000 mg | ORAL_TABLET | Freq: Every day | ORAL | Status: DC
  Administered 2022-01-09: 100 mg via ORAL
  Filled 2022-01-09: qty 1

## 2022-01-09 NOTE — ED Notes (Signed)
Pt woken up from sleep. Pt asked for something to drink, Probation officer gave pt options. Pt requested apple juice, pt given apple juice per allowed by RN. Pt in his room, drinking his apple juice now

## 2022-01-09 NOTE — ED Notes (Signed)
Pt requested to take a shower, shower supplies given and pt took a shower in the TCU/ Pt back on his stretcher

## 2022-01-09 NOTE — BH Assessment (Signed)
Comprehensive Clinical Assessment (CCA) Screening, Triage and Referral Note  01/09/2022 Dominic [name] [id number]  Disposition: Per Elta Guadeloupe, NP, patient is psych cleared. Patient has a medication management and injection appointment on 01/24/22 @ 1230. TTS added resource for ACTT services in AVS.   Flowsheet Row ED from 01/09/2022 in [facility] ED from 01/07/2022 in [facility] Admission (Discharged) from 12/21/2021 in [facility] INPATIENT BEHAVIORAL MEDICINE  C-SSRS RISK CATEGORY High Risk High Risk High Risk      The patient demonstrates the following risk factors for suicide: Chronic risk factors for suicide include: psychiatric disorder of schizophrenia and substance use disorder. Acute risk factors for suicide include: N/A. Protective factors for this patient include: responsibility to others (children, family) and hope for the future. Considering these factors, the overall suicide risk at this point appears to be low. Patient is appropriate for outpatient follow up.   Dominic [name] is a 46 year old male presenting to [facility] by [employer] with chief complaint of suicidal ideations with a plan to lay in traffic. Patient was seen in the ED on 01/07/22 and recommended for inpatient treatment, however, patient was reassessed the same day by EDP and was discharged. Patient reports he left the ED on the 25th and went to work at [employer]. Patient reports he is on a 42 probationary period with his job, and he cannot miss work. When asked about his SI and AH, patient states it was cold outside. I had to say something to come here and get warm. Patient reports he has an appointment on Wednesday with Mr. Neale Burly who is his landlord. Patient reports plans to moving into his place then and reports he needs somewhere to stay until Wednesday. Patient also reports he was supposed to go to [facility] today for an appointment to get his medications.     Patient is oriented x4, engaged, alert, cooperative and pleasant. Patient eye contact and speech is normal, affect is appropriate, thoughts are clear. Patient denies SI, HI, AVH and SIB and contracts for safety. Patient provides protective factors of his daughter (birthday coming up in April), job and he is future oriented. Patient reports using ICE, THC and cocaine a couple of days ago (UDS+) and has a history of long-term substance abuse history. Patient is on supervised probation and has an ankle monitor on. Patient reports his next court date is on 03/28/22.  Subjectively patient does not appear manic, or psychotic, nor does patient appear to be responding to internal/external stimuli. When asked what TTS could help him with patient states, he needs food and to send him on his way.  Patient agrees to outpatient services and would like TTS to help with getting appointments.   Chief Complaint:  Chief Complaint  Patient presents with   Suicidal   Visit Diagnosis: Homelessness  Polysubstance abuse  Hx of Schizoaffective disorder, depressive type (HCC) Suicidal Ideations   Patient Reported Information How did you hear about [place]? Legal System  What Is the Reason for Your Visit/Call Today? The history is provided by the patient, the police and medical records.   Dominic [name] is a 46 y.o. male who presents to the Emergency Department complaining of suicidal ideation.  He presents to the emergency department by police for evaluation of SI.  He was recently seen in the emergency department for same.  He states he plans to lay in traffic.  He states that he lost his job and wants to get back  on his medications.  He has not been able to pick up his medications since they were most recently prescribed.  He also reports ongoing auditory hallucinations.  Symptoms are severe and constant in nature.  How Long Has This Been Causing You Problems? > than 6 months  What Do You Feel Would Help You the  Most Today? Alcohol or Drug Use Treatment; Treatment for Depression or other mood problem; Housing Assistance; Food Assistance; Medication(s)   Have You Recently Had Any Thoughts About Hurting Yourself? No  Are You Planning to Commit Suicide/Harm Yourself At This time? No   Have you Recently Had Thoughts About Hurting Someone [name]? No  Are You Planning to Harm Someone at This Time? No  Explanation: No data recorded  Have You Used Any Alcohol or Drugs in the Past 24 Hours? No  How Long Ago Did You Use Drugs or Alcohol? No data recorded What Did You Use and How Much? Pt reports, smoking a blunt laced with Crystal Meth (unknown to him.)   Do You Currently Have a Therapist/Psychiatrist? No  Name of Therapist/Psychiatrist: [facility] [place]   Have You Been Recently Discharged From Any Office Practice or Programs? Yes  Explanation of Discharge From Practice/Program: ED discharge on 01/07/22    CCA Screening Triage Referral Assessment Type of Contact: Tele-Assessment  Telemedicine Service Delivery: Telemedicine service delivery: This service was provided via telemedicine using a 2-way, interactive audio and video technology  Is this Initial or Reassessment? Initial Assessment  Date Telepsych consult ordered in [facility]:  01/09/22  Time Telepsych consult ordered in [facility]:  0238  Location of Assessment: [facility] ED  Provider Location: [facility] Assessment Services   Collateral Involvement: Pt denies,having family, friend supports.   Does Patient Have a Automotive engineer Guardian? No data recorded Name and Contact of Legal Guardian: No data recorded If Minor and Not Living with Parent(s), Who has Custody? n/a  Is CPS involved or ever been involved? Never  Is APS involved or ever been involved? Never   Patient Determined To Be At Risk for Harm To Self or Others Based on Review of Patient Reported Information or Presenting Complaint? No  Method: No data  recorded Availability of Means: No data recorded Intent: No data recorded Notification Required: No data recorded Additional Information for Danger to Others Potential: No data recorded Additional Comments for Danger to Others Potential: No data recorded Are There Guns or Other Weapons in Your Home? No data recorded Types of Guns/Weapons: No data recorded Are These Weapons Safely Secured?                            No data recorded Who Could Verify You Are Able To Have These Secured: No data recorded Do You Have any Outstanding Charges, Pending Court Dates, Parole/Probation? No data recorded Contacted To Inform of Risk of Harm To Self or Others: Law Enforcement   Does Patient Present under Involuntary Commitment? Yes  IVC Papers Initial File Date: 01/09/22   [place] of Residence: [place]   Patient Currently Receiving the Following Services: Not Receiving Services   Determination of Need: Urgent (48 hours)   Options For Referral: Medication Management; Outpatient Therapy   Discharge Disposition:     Audree Camel, [facility]

## 2022-01-09 NOTE — BH Assessment (Signed)
Disposition: Per Elta Guadeloupe, NP, patient is psych cleared. Patient has a medication management and injection appointment on 01/24/22 @ 1230 at [facility] TTS also added resource for ACTT services in AVS.

## 2022-01-09 NOTE — ED Notes (Signed)
Pt has been changed out into scrubs. Pt has three belonging bags 1) with his shoes 2) with a black bag 3) his pants and belt. Pt was corporative

## 2022-01-09 NOTE — ED Provider Notes (Signed)
Dominic [name]   CSN: [id number] Arrival date & time: 01/09/22  0120     History  Chief Complaint  Patient presents with   Suicidal    Dominic [name] is a 46 y.o. male.  The history is provided by the patient, the police and medical records.  Dominic [name] is a 46 y.o. male who presents to the Emergency Department complaining of suicidal ideation.  He presents to the emergency department by police for evaluation of SI.  He was recently seen in the emergency department for same.  He states he plans to lay in traffic.  He states that he lost his job and wants to get back on his medications.  He has not been able to pick up his medications since they were most recently prescribed.  He also reports ongoing auditory hallucinations.  Symptoms are severe and constant in nature.    Home Medications Prior to Admission medications   Medication Sig Start Date End Date Taking? Authorizing Provider  ARIPiprazole (ABILIFY) 20 MG tablet Take 1 tablet (20 mg total) by mouth daily. 01/07/22   Quintella Reichert, MD  ARIPiprazole ER (ABILIFY MAINTENA) 400 MG SRER injection Inject 2 mLs (400 mg total) into the muscle every 28 (twenty-eight) days. 12/26/21   Clapacs, [name], MD  citalopram (CELEXA) 20 MG tablet Take 1 tablet (20 mg total) by mouth daily. 01/07/22   Quintella Reichert, MD  nicotine (NICODERM CQ - DOSED IN MG/24 HOURS) 21 mg/24hr patch Place 1 patch (21 mg total) onto the skin daily. 12/26/21   Clapacs, [name], MD  traZODone (DESYREL) 100 MG tablet Take 1 tablet (100 mg total) by mouth at bedtime. 01/07/22   Quintella Reichert, MD      Allergies    Patient has no known allergies.    Review of Systems   Review of Systems  All other systems reviewed and are negative.  Physical Exam Updated Vital Signs BP 138/86 (BP Location: Left Arm)    Pulse 61    Temp 97.6 F (36.4 C) (Oral)    Resp 16    SpO2 98%  Physical Exam Vitals and nursing [name]  reviewed.  Constitutional:      Appearance: He is well-developed.  HENT:     Head: Normocephalic and atraumatic.  Cardiovascular:     Rate and Rhythm: Normal rate and regular rhythm.  Pulmonary:     Effort: Pulmonary effort is normal. No respiratory distress.  Abdominal:     Palpations: Abdomen is soft.     Tenderness: There is no abdominal tenderness. There is no guarding or rebound.  Musculoskeletal:        General: No tenderness.  Skin:    General: Skin is warm and dry.  Neurological:     Mental Status: He is alert and oriented to person, place, and time.     Comments: Normal gait  Psychiatric:     Comments: Mildly agitated but redirectable.  Labile mood.     ED Results / Procedures / Treatments   Labs (all labs ordered are listed, but only abnormal results are displayed) Labs Reviewed  COMPREHENSIVE METABOLIC PANEL - Abnormal; Notable for the following components:      Result Value   Sodium 134 (*)    Potassium 3.3 (*)    Glucose, Bld 127 (*)    Calcium 8.8 (*)    Total Bilirubin 1.6 (*)    All other components within normal limits  CBC  WITH DIFFERENTIAL/PLATELET - Abnormal; Notable for the following components:   HCT 36.5 (*)    MCHC 36.2 (*)    All other components within normal limits  SALICYLATE LEVEL - Abnormal; Notable for the following components:   Salicylate Lvl [id number] (*)    All other components within normal limits  ACETAMINOPHEN LEVEL - Abnormal; Notable for the following components:   Acetaminophen (Tylenol), Serum <10 (*)    All other components within normal limits  RESP PANEL BY RT-PCR (FLU A&B, COVID) ARPGX2  ETHANOL  RAPID URINE DRUG SCREEN, HOSP PERFORMED    EKG EKG Interpretation  Date/Time:  Monday January 09 2022 01:39:28 EST Ventricular Rate:  56 PR Interval:  190 QRS Duration: 109 QT Interval:  464 QTC Calculation: 448 R Axis:   68 Text Interpretation: Sinus rhythm Biatrial enlargement RSR' in V1 or V2, probably normal variant  Left ventricular hypertrophy ST elev, probable normal early repol pattern Confirmed by Quintella Reichert [phone]) on 01/09/2022 3:33:43 AM  Radiology No results found.  Procedures Procedures    Medications Ordered in ED Medications  ARIPiprazole (ABILIFY) tablet 20 mg (has no administration in time range)  citalopram (CELEXA) tablet 20 mg (has no administration in time range)  traZODone (DESYREL) tablet 100 mg (100 mg Oral Given 01/09/22 0143)    ED Course/ Medical Decision Making/ A&P                           Medical Decision Making Amount and/or Complexity of Data Reviewed Labs: ordered.  Risk Prescription drug management.   Patient with history of mental illness here for evaluation of suicidal ideation.  He was recently seen for similar complaint.  Patient did not comply with medications that were prescribed at time of discharge, has recurrent SI.  Given that he has been unreliable regarding taking medications as well as follow-up and has recurrent SI feel he is unsafe.  IVC completed for patient safety.  He was given his home medications.  He has been medically cleared for psychiatric evaluation and treatment.        Final Clinical Impression(s) / ED Diagnoses Final diagnoses:  None    Rx / DC Orders ED Discharge Orders     None         Quintella Reichert, MD 01/09/22 [phone]

## 2022-01-09 NOTE — ED Provider Notes (Signed)
Cleared by psychiatry.  Outpatient treatment arranged.  IVC has been rescinded.  Patient discharged.   Virgina Norfolk, DO 01/09/22 1036

## 2022-01-09 NOTE — ED Notes (Signed)
Pt's breakfast has arrived. Pt also talking to TTS now

## 2022-01-09 NOTE — BH Assessment (Signed)
TTS attempted to see pt at 0800. Per RN pt is currently sleeping. Would you like for me to message you when the pt wakes up? I was told in report that the pt has not slept in days. When trying to wake him up earlier, he stated he just wanted to sleep. RN will contact TTS when pt is ready for TTS assessment.

## 2022-01-09 NOTE — ED Triage Notes (Signed)
Pt told an officer that he wanted to kill himself. Pt states that he wants to kill himself because he is crazy.

## 2022-01-24 ENCOUNTER — Encounter (HOSPITAL_COMMUNITY): Payer: No Typology Code available for payment source

## 2022-01-24 ENCOUNTER — Ambulatory Visit (HOSPITAL_COMMUNITY): Payer: No Typology Code available for payment source

## 2022-01-27 ENCOUNTER — Other Ambulatory Visit: Payer: Self-pay

## 2022-01-27 ENCOUNTER — Encounter (HOSPITAL_COMMUNITY): Payer: Self-pay

## 2022-01-27 ENCOUNTER — Emergency Department (HOSPITAL_COMMUNITY): Payer: Medicaid Other

## 2022-01-27 ENCOUNTER — Emergency Department (HOSPITAL_COMMUNITY)
Admission: EM | Admit: 2022-01-27 | Discharge: 2022-01-27 | Disposition: A | Discharge status: Home / Self Care | Payer: Medicaid Other | Attending: Emergency Medicine | Admitting: Emergency Medicine

## 2022-01-27 DIAGNOSIS — F411 Generalized anxiety disorder: Secondary | ICD-10-CM

## 2022-01-27 DIAGNOSIS — Z760 Encounter for issue of repeat prescription: Secondary | ICD-10-CM | POA: Insufficient documentation

## 2022-01-27 DIAGNOSIS — F5105 Insomnia due to other mental disorder: Secondary | ICD-10-CM

## 2022-01-27 DIAGNOSIS — R443 Hallucinations, unspecified: Secondary | ICD-10-CM | POA: Diagnosis not present

## 2022-01-27 DIAGNOSIS — R059 Cough, unspecified: Secondary | ICD-10-CM | POA: Insufficient documentation

## 2022-01-27 DIAGNOSIS — Z20822 Contact with and (suspected) exposure to covid-19: Secondary | ICD-10-CM | POA: Insufficient documentation

## 2022-01-27 DIAGNOSIS — F209 Schizophrenia, unspecified: Secondary | ICD-10-CM

## 2022-01-27 DIAGNOSIS — R051 Acute cough: Secondary | ICD-10-CM

## 2022-01-27 LAB — CBC WITH DIFFERENTIAL/PLATELET
Abs Immature Granulocytes: 0.03 10*3/uL (ref 0.00–0.07)
Basophils Absolute: 0 10*3/uL (ref 0.0–0.1)
Basophils Relative: 0 %
Eosinophils Absolute: 0.1 10*3/uL (ref 0.0–0.5)
Eosinophils Relative: 1 %
HCT: 36.3 % — ABNORMAL LOW (ref 39.0–52.0)
Hemoglobin: 13.3 g/dL (ref 13.0–17.0)
Immature Granulocytes: 0 %
Lymphocytes Relative: 28 %
Lymphs Abs: 3.5 10*3/uL (ref 0.7–4.0)
MCH: 29.6 pg (ref 26.0–34.0)
MCHC: 36.6 g/dL — ABNORMAL HIGH (ref 30.0–36.0)
MCV: 80.7 fL (ref 80.0–100.0)
Monocytes Absolute: 0.9 10*3/uL (ref 0.1–1.0)
Monocytes Relative: 7 %
Neutro Abs: 7.8 10*3/uL — ABNORMAL HIGH (ref 1.7–7.7)
Neutrophils Relative %: 64 %
Platelets: 218 10*3/uL (ref 150–400)
RBC: 4.5 MIL/uL (ref 4.22–5.81)
RDW: 14.5 % (ref 11.5–15.5)
WBC: 12.3 10*3/uL — ABNORMAL HIGH (ref 4.0–10.5)
nRBC: 0 % (ref 0.0–0.2)

## 2022-01-27 LAB — COMPREHENSIVE METABOLIC PANEL
ALT: 29 U/L (ref 0–44)
AST: 34 U/L (ref 15–41)
Albumin: 3.9 g/dL (ref 3.5–5.0)
Alkaline Phosphatase: 36 U/L — ABNORMAL LOW (ref 38–126)
Anion gap: 6 (ref 5–15)
BUN: 14 mg/dL (ref 6–20)
CO2: 26 mmol/L (ref 22–32)
Calcium: 8.8 mg/dL — ABNORMAL LOW (ref 8.9–10.3)
Chloride: 107 mmol/L (ref 98–111)
Creatinine, Ser: 0.71 mg/dL (ref 0.61–1.24)
GFR, Estimated: >60 mL/min (ref >60)
Glucose, Bld: 126 mg/dL — ABNORMAL HIGH (ref 70–99)
Potassium: 3.5 mmol/L (ref 3.5–5.1)
Sodium: 139 mmol/L (ref 135–145)
Total Bilirubin: 0.4 mg/dL (ref 0.3–1.2)
Total Protein: 7.7 g/dL (ref 6.5–8.1)

## 2022-01-27 LAB — RAPID URINE DRUG SCREEN, HOSP PERFORMED
Amphetamines: NOT DETECTED
Barbiturates: NOT DETECTED
Benzodiazepines: NOT DETECTED
Cocaine: POSITIVE — AB
Opiates: NOT DETECTED
Tetrahydrocannabinol: POSITIVE — AB

## 2022-01-27 LAB — ETHANOL: Alcohol, Ethyl (B): <10 mg/dL (ref <10)

## 2022-01-27 LAB — RESP PANEL BY RT-PCR (FLU A&B, COVID) ARPGX2
Influenza A by PCR: NEGATIVE
Influenza B by PCR: NEGATIVE
SARS Coronavirus 2 by RT PCR: NEGATIVE

## 2022-01-27 MED ORDER — ARIPIPRAZOLE 20 MG PO TABS: 20.0000 mg | ORAL_TABLET | Freq: Every day | ORAL | 0 refills | Status: DC

## 2022-01-27 MED ORDER — ACETAMINOPHEN 325 MG PO TABS
650.0000 mg | ORAL_TABLET | Freq: Once | ORAL | Status: AC
  Administered 2022-01-27: 650 mg via ORAL
  Filled 2022-01-27: qty 2

## 2022-01-27 MED ORDER — CITALOPRAM HYDROBROMIDE 20 MG PO TABS: 20.0000 mg | ORAL_TABLET | Freq: Every day | ORAL | 0 refills | Status: DC

## 2022-01-27 MED ORDER — TRAZODONE HCL 100 MG PO TABS: 100.0000 mg | ORAL_TABLET | Freq: Every day | ORAL | 0 refills | Status: DC

## 2022-01-27 NOTE — ED Triage Notes (Addendum)
Per EMS- patient reports that he is out of his psych meds.  ?Patient also reports a cough and having pain all over. Patient states he has been walking in the ran. ? ? ?Patient states he was working and missed his appointment on 01/24/22 and was unable to get his psych meds. Patient states he is hearing voices and seeing objects.Marland Kitchen ?Patient denies any suicidal/homicidal thoughts ?

## 2022-01-27 NOTE — ED Provider Notes (Signed)
?[facility]-EMERGENCY DEPT ?Provider Note ? ? ?CSN: [id number] ?Arrival date & time: 01/27/22  1728 ? ?  ? ?History ? ?Chief Complaint  ?Patient presents with  ? out of psych meds  ? Cough  ? ? ?Dominic [name] is a 46 y.o. male who presents to the emergency department with several complaints.  Patient states that he has had a cough and is "having pain all over".  He also states that he has been walking in the rain.  States he is having difficulty swallowing because his mouth is so dry. ? ?He reports being out of his psychiatric medication for several days.  He states that he was working and missed his appointment on 3/14, has been able to get his meds.  He reports hearing voices and seeing objects.  He denies any suicidal or homicidal ideation. ? ? ?Cough ? ?  ? ?Home Medications ?Prior to Admission medications   ?Medication Sig Start Date End Date Taking? Authorizing Provider  ?ARIPiprazole (ABILIFY) 20 MG tablet Take 1 tablet (20 mg total) by mouth daily. 01/27/22   Naasia Weilbacher T, PA-C  ?ARIPiprazole ER (ABILIFY MAINTENA) 400 MG SRER injection Inject 2 mLs (400 mg total) into the muscle every 28 (twenty-eight) days. 12/26/21   Clapacs, Jackquline DenmarkJohn T, MD  ?citalopram (CELEXA) 20 MG tablet Take 1 tablet (20 mg total) by mouth daily. 01/27/22   Daison Braxton T, PA-C  ?nicotine (NICODERM CQ - DOSED IN MG/24 HOURS) 21 mg/24hr patch Place 1 patch (21 mg total) onto the skin daily. 12/26/21   Clapacs, Jackquline DenmarkJohn T, MD  ?traZODone (DESYREL) 100 MG tablet Take 1 tablet (100 mg total) by mouth at bedtime. 01/27/22   Monte Bronder T, PA-C  ?   ? ?Allergies    ?Patient has no known allergies.   ? ?Review of Systems   ?Review of Systems  ?Respiratory:  Positive for cough.   ?Psychiatric/Behavioral:  Positive for behavioral problems and hallucinations. Negative for suicidal ideas.   ?All other systems reviewed and are negative. ? ?Physical Exam ?Updated Vital Signs ?BP 130/67   Pulse 71   Temp (!) 97.4 ?F  (36.3 ?C) (Oral)   Resp (!) 26   Ht 5\' 11"  (1.803 m)   Wt 97 kg   SpO2 97%   BMI 29.83 kg/m?  ?Physical Exam ?Vitals and nursing note reviewed.  ?Constitutional:   ?   Appearance: Normal appearance.  ?HENT:  ?   Head: Normocephalic and atraumatic.  ?Eyes:  ?   Conjunctiva/sclera: Conjunctivae normal.  ?Cardiovascular:  ?   Rate and Rhythm: Normal rate and regular rhythm.  ?Pulmonary:  ?   Effort: Pulmonary effort is normal. No respiratory distress.  ?   Breath sounds: Normal breath sounds.  ?Skin: ?   General: Skin is warm and dry.  ?Neurological:  ?   Mental Status: He is alert.  ?Psychiatric:     ?   Attention and Perception: He is inattentive. He perceives auditory and visual hallucinations.     ?   Mood and Affect: Mood normal.     ?   Speech: Speech is slurred.     ?   Behavior: Behavior is agitated.     ?   Thought Content: Thought content does not include homicidal or suicidal ideation. Thought content does not include homicidal or suicidal plan.  ? ? ?ED Results / Procedures / Treatments   ?Labs ?(all labs ordered are listed, but only abnormal results are displayed) ?Labs  Reviewed  ?COMPREHENSIVE METABOLIC PANEL - Abnormal; Notable for the following components:  ?    Result Value  ? Glucose, Bld 126 (*)   ? Calcium 8.8 (*)   ? Alkaline Phosphatase 36 (*)   ? All other components within normal limits  ?RAPID URINE DRUG SCREEN, HOSP PERFORMED - Abnormal; Notable for the following components:  ? Cocaine POSITIVE (*)   ? Tetrahydrocannabinol POSITIVE (*)   ? All other components within normal limits  ?CBC WITH DIFFERENTIAL/PLATELET - Abnormal; Notable for the following components:  ? WBC 12.3 (*)   ? HCT 36.3 (*)   ? MCHC 36.6 (*)   ? Neutro Abs 7.8 (*)   ? All other components within normal limits  ?RESP PANEL BY RT-PCR (FLU A&B, COVID) ARPGX2  ?ETHANOL  ? ? ?EKG ?None ? ?Radiology ?DG Chest Portable 1 View ? ?Result Date: 01/27/2022 ?CLINICAL DATA:  Cough EXAM: PORTABLE CHEST 1 VIEW COMPARISON:  12/21/2021  FINDINGS: Lungs are clear.  No pleural effusion or pneumothorax. The heart is normal in size. IMPRESSION: No evidence of acute cardiopulmonary disease. Electronically Signed   By: Charline Bills M.D.   On: 01/27/2022 19:41   ? ?Procedures ?Procedures  ? ? ?Medications Ordered in ED ?Medications  ?acetaminophen (TYLENOL) tablet 650 mg (has no administration in time range)  ? ? ?ED Course/ Medical Decision Making/ A&P ?  ?                        ?Medical Decision Making ?Amount and/or Complexity of Data Reviewed ?Labs: ordered. ?Radiology: ordered. ? ?HPI: ?Patient is a 46 year old male with history of bipolar disorder, schizophrenia, hepatitis C, asthma who presents to the emergency department reporting that he is out of his psych meds.  He also states that he is having a cough and "pain all over", and he has been walking out in the rain all day.  States that he is having visual and auditory hallucinations, hearing voices and seeing objects.  He denies any suicidal or homicidal ideation. ? ?Physical exam: ?On my evaluation patient is sleeping, but is easily arousable.  His speech is slurred and he is complaining of a sore throat because his throat is dry.  He is not toxic appearing. ? ?Labs/Imaging: ?Medical screening labs obtained significant for mild leukocytosis of 12,300, may be incidental finding.  Normal hemoglobin.  Electrolytes grossly within normal limits, glucose of 126.  Urine drug screen positive for cocaine and THC.  Respiratory panel negative for COVID and flu.  Ethanol level less than 10.  Chest x-ray negative for acute cardiopulmonary abnormalities. ? ?Medications: ?We will provide the patient with some Tylenol for generalized body aches.  ? ?Disposition: ?On reevaluation, I do not believe patient requires additional psychiatric evaluation or treatment today. He states his hallucinations began after he ran out of his medicine several days ago.  Upon chart review it appears that this happens every  time the patient does not take his medicine.  At this time we will cancel TTS consult, and refill patient's medications. I do not believe he is a harm to himself or others.  ? ?He states that his cough started this morning I discussed with him this is likely related to a virus or possibly allergies.  We have given him a meal while in the department, and he tolerated this well. We discussed reasons to return to the ER and patient is agreeable to the plan.  ? ?Final Clinical Impression(s) /  ED Diagnoses ?Final diagnoses:  ?Hallucinations  ?Acute cough  ?Medication refill  ? ? ?Rx / DC Orders ?ED Discharge Orders   ? ?      Ordered  ?  ARIPiprazole (ABILIFY) 20 MG tablet  Daily       ? 01/27/22 2141  ?  citalopram (CELEXA) 20 MG tablet  Daily       ? 01/27/22 2141  ?  traZODone (DESYREL) 100 MG tablet  Daily at bedtime       ? 01/27/22 2141  ? ?  ?  ? ?  ? ?Portions of this report may have been transcribed using voice recognition software. Every effort was made to ensure accuracy; however, inadvertent computerized transcription errors may be present. ? ?  ?Boruch Manuele T, PA-C ?01/27/22 2147 ? ?  ?Gerhard Munch, MD ?01/27/22 2218 ? ?

## 2022-01-27 NOTE — ED Notes (Signed)
Pt given something to eat and drink. 

## 2022-01-27 NOTE — Discharge Instructions (Addendum)
You were seen in the emergency department for a medication refill and a cough. ? ?I have refilled your medicines.  ? ?We tested you for flu and COVID and these tests were negative. The x-ray of your chest was normal. I think your cough is related to allergies or a different viral illness.  ?

## 2022-02-10 ENCOUNTER — Ambulatory Visit (HOSPITAL_COMMUNITY): Payer: No Typology Code available for payment source | Admitting: Physician Assistant

## 2022-02-16 ENCOUNTER — Other Ambulatory Visit: Payer: Self-pay

## 2022-02-16 ENCOUNTER — Ambulatory Visit (HOSPITAL_COMMUNITY)
Admission: EM | Admit: 2022-02-16 | Discharge: 2022-02-17 | Disposition: A | Discharge status: Home / Self Care | Payer: Medicaid Other | Attending: Behavioral Health | Admitting: Behavioral Health

## 2022-02-16 DIAGNOSIS — F1020 Alcohol dependence, uncomplicated: Secondary | ICD-10-CM | POA: Insufficient documentation

## 2022-02-16 DIAGNOSIS — F411 Generalized anxiety disorder: Secondary | ICD-10-CM

## 2022-02-16 DIAGNOSIS — F1290 Cannabis use, unspecified, uncomplicated: Secondary | ICD-10-CM | POA: Diagnosis not present

## 2022-02-16 DIAGNOSIS — F1190 Opioid use, unspecified, uncomplicated: Secondary | ICD-10-CM | POA: Insufficient documentation

## 2022-02-16 DIAGNOSIS — F251 Schizoaffective disorder, depressive type: Secondary | ICD-10-CM

## 2022-02-16 DIAGNOSIS — F1490 Cocaine use, unspecified, uncomplicated: Secondary | ICD-10-CM | POA: Insufficient documentation

## 2022-02-16 DIAGNOSIS — F1590 Other stimulant use, unspecified, uncomplicated: Secondary | ICD-10-CM | POA: Insufficient documentation

## 2022-02-16 DIAGNOSIS — Z20822 Contact with and (suspected) exposure to covid-19: Secondary | ICD-10-CM | POA: Diagnosis not present

## 2022-02-16 DIAGNOSIS — Z9151 Personal history of suicidal behavior: Secondary | ICD-10-CM | POA: Insufficient documentation

## 2022-02-16 DIAGNOSIS — F1990 Other psychoactive substance use, unspecified, uncomplicated: Secondary | ICD-10-CM

## 2022-02-16 DIAGNOSIS — R45851 Suicidal ideations: Secondary | ICD-10-CM | POA: Diagnosis not present

## 2022-02-16 DIAGNOSIS — F1420 Cocaine dependence, uncomplicated: Secondary | ICD-10-CM | POA: Insufficient documentation

## 2022-02-16 DIAGNOSIS — F200 Paranoid schizophrenia: Secondary | ICD-10-CM | POA: Insufficient documentation

## 2022-02-16 DIAGNOSIS — Z79899 Other long term (current) drug therapy: Secondary | ICD-10-CM | POA: Insufficient documentation

## 2022-02-16 LAB — LIPID PANEL
Cholesterol: 167 mg/dL (ref 0–200)
HDL: 60 mg/dL (ref >40)
LDL Cholesterol: 99 mg/dL (ref 0–99)
Total CHOL/HDL Ratio: 2.8 RATIO
Triglycerides: 40 mg/dL (ref <150)
VLDL: 8 mg/dL (ref 0–40)

## 2022-02-16 LAB — RESP PANEL BY RT-PCR (FLU A&B, COVID) ARPGX2
Influenza A by PCR: NEGATIVE
Influenza B by PCR: NEGATIVE
SARS Coronavirus 2 by RT PCR: NEGATIVE

## 2022-02-16 LAB — CBC WITH DIFFERENTIAL/PLATELET
Abs Immature Granulocytes: 0.02 10*3/uL (ref 0.00–0.07)
Basophils Absolute: 0 10*3/uL (ref 0.0–0.1)
Basophils Relative: 0 %
Eosinophils Absolute: 0.1 10*3/uL (ref 0.0–0.5)
Eosinophils Relative: 1 %
HCT: 39.3 % (ref 39.0–52.0)
Hemoglobin: 14.1 g/dL (ref 13.0–17.0)
Immature Granulocytes: 0 %
Lymphocytes Relative: 43 %
Lymphs Abs: 3.3 10*3/uL (ref 0.7–4.0)
MCH: 29.3 pg (ref 26.0–34.0)
MCHC: 35.9 g/dL (ref 30.0–36.0)
MCV: 81.7 fL (ref 80.0–100.0)
Monocytes Absolute: 0.7 10*3/uL (ref 0.1–1.0)
Monocytes Relative: 9 %
Neutro Abs: 3.5 10*3/uL (ref 1.7–7.7)
Neutrophils Relative %: 47 %
Platelets: 215 10*3/uL (ref 150–400)
RBC: 4.81 MIL/uL (ref 4.22–5.81)
RDW: 14.6 % (ref 11.5–15.5)
WBC: 7.6 10*3/uL (ref 4.0–10.5)
nRBC: 0 % (ref 0.0–0.2)

## 2022-02-16 LAB — POCT URINE DRUG SCREEN - MANUAL ENTRY (I-SCREEN)
POC Amphetamine UR: NOT DETECTED
POC Buprenorphine (BUP): NOT DETECTED
POC Cocaine UR: POSITIVE — AB
POC Marijuana UR: POSITIVE — AB
POC Methadone UR: NOT DETECTED
POC Methamphetamine UR: POSITIVE — AB
POC Morphine: POSITIVE — AB
POC Oxazepam (BZO): NOT DETECTED
POC Oxycodone UR: NOT DETECTED
POC Secobarbital (BAR): NOT DETECTED

## 2022-02-16 LAB — COMPREHENSIVE METABOLIC PANEL
ALT: 33 U/L (ref 0–44)
AST: 33 U/L (ref 15–41)
Albumin: 3.9 g/dL (ref 3.5–5.0)
Alkaline Phosphatase: 36 U/L — ABNORMAL LOW (ref 38–126)
Anion gap: 6 (ref 5–15)
BUN: 7 mg/dL (ref 6–20)
CO2: 24 mmol/L (ref 22–32)
Calcium: 9.3 mg/dL (ref 8.9–10.3)
Chloride: 107 mmol/L (ref 98–111)
Creatinine, Ser: 0.77 mg/dL (ref 0.61–1.24)
GFR, Estimated: >60 mL/min (ref >60)
Glucose, Bld: 92 mg/dL (ref 70–99)
Potassium: 4.1 mmol/L (ref 3.5–5.1)
Sodium: 137 mmol/L (ref 135–145)
Total Bilirubin: 0.9 mg/dL (ref 0.3–1.2)
Total Protein: 7.2 g/dL (ref 6.5–8.1)

## 2022-02-16 LAB — HEMOGLOBIN A1C
Hgb A1c MFr Bld: 5 % (ref 4.8–5.6)
Mean Plasma Glucose: 96.8 mg/dL

## 2022-02-16 LAB — ETHANOL: Alcohol, Ethyl (B): <10 mg/dL (ref <10)

## 2022-02-16 LAB — TSH: TSH: 2.476 u[IU]/mL (ref 0.350–4.500)

## 2022-02-16 LAB — POC SARS CORONAVIRUS 2 AG -  ED: SARS Coronavirus 2 Ag: NEGATIVE

## 2022-02-16 MED ORDER — THIAMINE HCL 100 MG PO TABS
100.0000 mg | ORAL_TABLET | Freq: Every day | ORAL | Status: DC
  Administered 2022-02-17: 100 mg via ORAL
  Filled 2022-02-16: qty 1

## 2022-02-16 MED ORDER — HYDROXYZINE HCL 25 MG PO TABS
25.0000 mg | ORAL_TABLET | Freq: Three times a day (TID) | ORAL | Status: DC | PRN
  Administered 2022-02-16: 25 mg via ORAL
  Filled 2022-02-16: qty 1

## 2022-02-16 MED ORDER — ACETAMINOPHEN 325 MG PO TABS: 650.0000 mg | ORAL_TABLET | Freq: Four times a day (QID) | ORAL | Status: DC | PRN

## 2022-02-16 MED ORDER — LORAZEPAM 1 MG PO TABS: 1.0000 mg | ORAL_TABLET | Freq: Four times a day (QID) | ORAL | Status: DC | PRN

## 2022-02-16 MED ORDER — LOPERAMIDE HCL 2 MG PO CAPS: 2.0000 mg | ORAL_CAPSULE | ORAL | Status: DC | PRN

## 2022-02-16 MED ORDER — TRAZODONE HCL 50 MG PO TABS
50.0000 mg | ORAL_TABLET | Freq: Every evening | ORAL | Status: DC | PRN
  Administered 2022-02-16: 50 mg via ORAL
  Filled 2022-02-16: qty 1

## 2022-02-16 MED ORDER — THIAMINE HCL 100 MG/ML IJ SOLN
100.0000 mg | Freq: Once | INTRAMUSCULAR | Status: AC
  Administered 2022-02-16: 100 mg via INTRAMUSCULAR
  Filled 2022-02-16: qty 2

## 2022-02-16 MED ORDER — MAGNESIUM HYDROXIDE 400 MG/5ML PO SUSP: 30.0000 mL | Freq: Every day | ORAL | Status: DC | PRN

## 2022-02-16 MED ORDER — CITALOPRAM HYDROBROMIDE 20 MG PO TABS
20.0000 mg | ORAL_TABLET | Freq: Every day | ORAL | Status: DC
  Administered 2022-02-16 – 2022-02-17 (×2): 20 mg via ORAL
  Filled 2022-02-16 (×2): qty 1

## 2022-02-16 MED ORDER — ARIPIPRAZOLE 10 MG PO TABS
10.0000 mg | ORAL_TABLET | Freq: Every day | ORAL | Status: DC
  Administered 2022-02-16 – 2022-02-17 (×2): 10 mg via ORAL
  Filled 2022-02-16 (×2): qty 1

## 2022-02-16 MED ORDER — ADULT MULTIVITAMIN W/MINERALS CH
1.0000 | ORAL_TABLET | Freq: Every day | ORAL | Status: DC
  Administered 2022-02-16 – 2022-02-17 (×2): 1 via ORAL
  Filled 2022-02-16 (×2): qty 1

## 2022-02-16 MED ORDER — ALUM & MAG HYDROXIDE-SIMETH 200-200-20 MG/5ML PO SUSP: 30.0000 mL | ORAL | Status: DC | PRN

## 2022-02-16 MED ORDER — ONDANSETRON 4 MG PO TBDP: 4.0000 mg | ORAL_TABLET | Freq: Four times a day (QID) | ORAL | Status: DC | PRN

## 2022-02-16 NOTE — Progress Notes (Signed)
Dominic [name] was cooperative wit the admission process, He was relocated to the OBS area and oriented to his new environment. He called his probation officer related his his ankle monitor alarming. He left the charger at home. He endorsed feeling depressed, suicidal, hears voices and seeing images. He stated the voices are people crying out for water. The visual images is Jesus, a thief and a robin. He received nourishments and was allowed to relax in his chair bed. ?

## 2022-02-16 NOTE — ED Notes (Signed)
Although just waking from sleep Dominic [name] is requesting a sleep aide and something for anxiety see MAR for medication for anxiety and sleep  that were given. ?

## 2022-02-16 NOTE — BH Assessment (Signed)
Comprehensive Clinical Assessment (CCA) Note ? ?02/16/2022 ?Dominic [name] ?[id number] ? ? ?Disposition:  Per [name], NP, patient is recommended for continuous assessment ? ?The patient demonstrates the following risk factors for suicide: Chronic risk factors for suicide include: psychiatric disorder of schizophrenia, substance use disorder, and previous suicide attempts "a lot of attempts that failed." . Acute risk factors for suicide include: family or marital conflict, social withdrawal/isolation, and loss (financial, interpersonal, professional). Protective factors for this patient include: responsibility to others (children, family), hope for the future, and religious beliefs against suicide. Considering these factors, the overall suicide risk at this point appears to be moderate. Patient is not appropriate for outpatient follow up.  ? ? ?AIMS   ? ?Flowsheet Row Admission (Discharged) from 07/15/2019 in [facility] INPATIENT BEHAVIORAL MEDICINE Admission (Discharged) from 01/14/2019 in [facility] INPATIENT BEHAVIORAL MEDICINE  ?AIMS Total Score 0 0  ? ?  ? ?AUDIT   ? ?Flowsheet Row Admission (Discharged) from 12/21/2021 in [facility] INPATIENT BEHAVIORAL MEDICINE Admission (Discharged) from 07/15/2021 in [facility] INPATIENT BEHAVIORAL MEDICINE Admission (Discharged) from 07/15/2019 in [facility] INPATIENT BEHAVIORAL MEDICINE Admission (Discharged) from 05/22/2019 in [facility] INPATIENT BEHAVIORAL MEDICINE Admission (Discharged) from 01/14/2019 in [facility] INPATIENT BEHAVIORAL MEDICINE  ?Alcohol Use Disorder Identification Test Final Score (AUDIT) 2 28 9 8 9   ? ?  ? ?GAD-7   ? ?Flowsheet Row Clinical Support from 07/27/2021 in [facility] Office Visit from 04/29/2021 in [facility]  ?Total GAD-7 Score 2 2  ? ?  ? ?PHQ2-9   ? ?Flowsheet Row ED from 02/16/2022 in [facility] Office Visit from 08/03/2021 in [facility] Clinical Support from 07/27/2021 in  [facility] Office Visit from 04/29/2021 in [facility] Counselor from 04/26/2021 in [facility]  ?PHQ-2 Total Score 5 0 0 0 1  ?PHQ-9 Total Score 24 -- -- -- --  ? ?  ? ?Flowsheet Row ED from 01/27/2022 in [facility] from 01/09/2022 in [facility] ED from 01/07/2022 in [facility] DEPT  ?C-SSRS RISK CATEGORY Error: Q3, 4, or 5 should not be populated when Q2 is No High Risk High Risk  ? ?  ? ? ?Chief Complaint:  ?Chief Complaint  ?Patient presents with  ? Suicidal  ? Hallucinations  ? ?Visit Diagnosis: F20.3 Schizophrenia, F10.20 Alcohol Use Disorder Severe, F14.20 Cocaine Use Disorder Severe, F 12.20 Cannabis Use Disorder Severe  ? ? ?CCA Screening, Triage and Referral (STR) ? ?Patient Reported Information ?How did you hear about [facility]? Legal System ? ?What Is the Reason for Your Visit/Call Today? Pt presents to [facility] voluntarily escorted by [employer].  Per [employer] pt was picked up from the [facility] with complaints of hallucinations. Pt states that he has been feeling suicidal with a plan to jump in front of traffic because he does not want to be "locked up". Pt states that his parole officer is trying to lock him up and he does not want this to happen. Pt states that he is having visual hallucinations currently, " I see jesus on the cross". Pt denies HI. ? ?Patient states that he is on probation hand has two probation officers.  Patient is wearing an ankle monitor.  Patient states that he missed his court date in [facility] and he was told that if he did not come that there would be a warrant out  for him. Patient states that he had no transportation to court and states that he is overwhelmed and states that he does not want to go back to prison.  He states that he would rather just be dead than to go back.   ? ?Patient admits to use of alcohol,  cocaine and marijuana daily and states that he is giving all his money to the drug dealers and states that he should be giving money to his children instead.  Patient states that he is using a gram of cocaine daily, he is using an 1/8 ounce of marijuana daily and he states that he is drinking 12 beers daily.  His last use was yesterday.  Patient is currently not exhibiting any withdrawal symptoms. ? ?Patient is alert and oriented x 3, but could not identify the correct month or year.  His judgment, insight and impulse control are characteristically impaired.  His thoughts are mildly disorganized, but his memory is intact.  He states that he constantly hears voices screaming at him.  His speech is normal in tone and rate and his eye contact is good. ? ? ?How Long Has This Been Causing You Problems? 1 wk - 1 month ? ?What Do You Feel Would Help You the Most Today? Treatment for Depression or other mood problem ? ? ?Have You Recently Had Any Thoughts About Hurting Yourself? Yes ? ?Are You Planning to Commit Suicide/Harm Yourself At This time? Yes (jump in front of traffic) ? ? ?Have you Recently Had Thoughts About Hurting Someone Karolee Ohslse? No ? ?Are You Planning to Harm Someone at This Time? No ? ?Explanation: No data recorded ? ?Have You Used Any Alcohol or Drugs in the Past 24 Hours? No ? ?How Long Ago Did You Use Drugs or Alcohol? No data recorded ?What Did You Use and How Much? Pt reports, smoking a blunt laced with Crystal Meth (unknown to him.) ? ? ?Do You Currently Have a Therapist/Psychiatrist? No ? ?Name of Therapist/Psychiatrist: [facility] ? ? ?Have You Been Recently Discharged From Any Office Practice or Programs? Yes ? ?Explanation of Discharge From Practice/Program: ED discharge on 01/07/22 ? ? ?  ?CCA Screening Triage Referral Assessment ?Type of Contact: Tele-Assessment ? ?Telemedicine Service Delivery:   ?Is this Initial or Reassessment? Initial Assessment ? ?Date Telepsych consult ordered  in [facility]:  01/09/22 ? ?Time [facility]psych consult [facility]:  0238 ? ?Location of Assessment: [facility] ED ? ?[facility] ? ? ?Collateral Involvement: Pt denies,having family, friend supports. ? ? ?Does Patient Have a Automotive engineerCourt Appointed Legal Guardian? No data recorded ?Name and Contact of Legal Guardian: No data recorded ?If Minor and Not Living with Parent(s), Who has Custody? n/a ? ?Is CPS involved or ever been involved? Never ? ?Is APS involved or ever been involved? Never ? ? ?Patient Determined To Be At Risk for Harm To Self or Others Based on Review of Patient Reported Information or Presenting Complaint? No ? ?Method: No data recorded ?Availability of Means: No data recorded ?Intent: No data recorded ?Notification Required: No data recorded ?Additional Information for Danger to Others Potential: No data recorded ?Additional Comments for Danger to Others Potential: No data recorded ?Are There Guns or Other Weapons in Your Home? No data recorded ?Types of Guns/Weapons: No data recorded ?Are These Weapons Safely Secured?  No data recorded ?Who Could Verify You Are Able To Have These Secured: No data recorded ?Do You Have any Outstanding Charges, Pending Court Dates, Parole/Probation? No data recorded ?Contacted To Inform of Risk of Harm To Self or Others: Law Enforcement ? ? ? ?Does Patient Present under Involuntary Commitment? Yes ? ?IVC Papers Initial File Date: 01/09/22 ? ? ?[place] of Residence: [place] ? ? ?Patient Currently Receiving the Following Services: Not Receiving Services ? ? ?Determination of Need: Urgent (48 hours) ? ? ?Options For Referral: Medication Management; Outpatient Therapy ? ? ? ? ?CCA Biopsychosocial ?Patient Reported Schizophrenia/Schizoaffective Diagnosis in Past: Yes ? ? ?Strengths: Has some support, some insight & knows he needs his medications and needs to get off drugs ? ? ?Mental Health Symptoms ?Depression:   ?Change in  energy/activity; Increase/decrease in appetite; Hopelessness; Worthlessness; Difficulty Concentrating; Tearfulness; Sleep (too much or little); Irritability ?  ?Duration of Depressive symptoms:  ?Duration of Depress

## 2022-02-16 NOTE — ED Triage Notes (Signed)
Pt presents to [facility] voluntarily escorted by [employer].  Per [employer] pt was picked up from the [facility] with complaints of hallucinations. Pt states that he has been feeling suicidal with a plan to jump in front of traffic because he does not want to be "locked up". Pt states that his parole officer is trying to lock him up and he does not want this to happen. Pt states that he is having visual hallucinations currently, " I see jesus on the cross". Pt denies HI. ?

## 2022-02-16 NOTE — ED Notes (Signed)
Pt sleeping@this time. Breathing even and unlabored. Will continue to monitor for safety 

## 2022-02-16 NOTE — ED Notes (Signed)
Remains asleep no distress or disturbed sleep noted respiration are easy. ?

## 2022-02-16 NOTE — ED Provider Notes (Signed)
Behavioral Health Admission H&P ?(FBC & OBS) ? ?Date: 02/16/22 ?Patient Name: Dominic [name] ?MRN: [id number] ?Chief Complaint:  ?Chief Complaint  ?Patient presents with  ? Suicidal  ? Hallucinations  ?   ? ?Diagnoses:  ?Final diagnoses:  ?Schizoaffective disorder, depressive type (HCC)  ?Suicidal ideation  ?Substance use disorder  ? ? ?HPI: Dominic [name] is a 46 year old male patient who presents to the [facility] behavioral health urgent care voluntary accompanied by law enforcement with a chief complaint of "suicidal ideations with a plan to jump in front of traffic because he does not want to be locked up." ? ?Patient states, "I tried to take myself out by laying down on [facility] He endorses suicidal ideations with a plan and intent. He states that he had a court date today and was unable to make the appointment because he didn't have transportation. He states that his parole officer told him that he had to go to the court hearing today. He states that he called the clerk of court and they told him that they would lock him up he did not go to court today. Patient states, "I will have a warrant for my arrest all for proof of address." He shows this provider his ankle monitor and states that he is currently on parole. He states that he did 15 years in jail and states that if he does time "I feel suicidal." Patient reports past suicide attempts "a lot of failed attempts." Patient states that his five children gives him purpose for wanting to live and "not pulling stunts." He denies HI.  ? ?Patient endorses auditory and visual hallucinations. He describes the visual hallucinations as seeing the sacrifice of Jesus hanging by the wall and door in the consult room. He describes the auditory hallucinations as hearing people hollering from hell saying they need water, they need water. Patient does not appear to be responding to internal or external stimuli. ? ?Patient reports feeling depressed since  the break-up with his ex-girlfriend 3 weeks ago. He is unable to describe his depressive symptoms and states it is "unbearable."  He reports poor sleep. He reports poor appetite. He states that he loss weight and was initially weighing 255 and he is now 195. It is unclear as to the time frame the patient loss wt.  ? ?He reports using fentanyl, cocaine, and weed. He reports using fentanyl 3 times per month and states that he last used last week. He reports using fentanyl for the past 3 years on average he snorts a gram. He reports using cocaine, on average a gram per day, last use was 2 days ago. He reports that he has been using cocaine since age 66. He reports smoking weed everyday, on average 1/8th per day for 8 years. He reports drinking alcohol, on average a 12 pack/day. He states that his last drink was last night. He states that he has been drinking alcohol since age 41. Patient denies hx of alcohol withdrawal symptoms, seizures and DTs. Patient denies alcohol withdrawal symptoms at this time. No signs of withdrawal observed.  ? ?Patient states that he resides in a boardinghouse. 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 He states that he does not have outpatient psychiatric services. He states that he takes a Abilify injection. He states that he last received  the Abilify injection in February. He states that he's been off his medication for three months. ? ?Per chart review, patient was  [facility]. Patient was prescribed Celexa 20 mg p.o. daily, trazodone 100 mg p.o. QHS, Abilify ER 400 mg IM on 2/13 ???-date unclear, and Abilify 20 mg p.o. nightly. ? ? ?PHQ 2-9:   ?Flowsheet Row [facility] from [facility]  ?C-SSRS RISK CATEGORY Error: Q3, 4, or 5 should not be populated when Q2 is No High  Risk High Risk  ? ?  ?  ? ?Total Time spent with patient: 30 minutes ? ?Musculoskeletal  ?Strength & Muscle Tone: within normal limits ?Gait & Station: normal ?Patient leans: N/A ? ?Psychiatric Specialty Exam  ?Presentation ?General Appearance: Disheveled ? ?Eye Contact:Fair ? ?Speech:Clear and Coherent ? ?Speech Volume:Increased ? ?Handedness:Right ? ? ?Mood and Affect  ?Mood:Depressed; Anxious ? ?Affect:Congruent ? ? ?Thought Process  ?Thought Processes:Coherent; Goal Directed ? ?Descriptions of Associations:Intact ? ?Orientation:Full (Time, Place and Person) ? ?Thought Content:Logical ? Diagnosis of Schizophrenia or Schizoaffective disorder in past: Yes ? Duration of Psychotic Symptoms: Greater than six months ? ?Hallucinations:Hallucinations: Visual; Auditory ? ?Ideas of Reference:None ? ?Suicidal Thoughts:Suicidal Thoughts: Yes, Active ? ?Homicidal Thoughts:Homicidal Thoughts: No ? ? ?Sensorium  ?Memory:Immediate Fair; Remote Fair; Recent Fair ? ?Judgment:Intact ? ?Insight:Present ? ? ?Executive Functions  ?Concentration:Fair ? ?Attention Span:Fair ? ?Recall:Fair ? ?Fund of Knowledge:Fair ? ?Language:Fair ? ? ?Psychomotor Activity  ?Psychomotor Activity:Psychomotor Activity: Normal ? ? ?Assets  ?Assets:Communication Skills; Desire for Improvement; Housing; Physical Health; Leisure Time ? ? ?Sleep  ?Sleep:Sleep: Poor ?Number of Hours of Sleep: 3 ? ? ?Nutritional Assessment (For OBS and FBC admissions only) ?Has the patient had a weight loss or gain of 10 pounds or more in the last 3 months?: Yes ?Has the patient had a decrease in food intake/or appetite?: Yes ?Does the patient have dental problems?: No ?Does the patient have eating habits or behaviors that may be indicators of an eating disorder including binging or inducing vomiting?: No ?Has the patient recently lost weight without trying?: 2.0 ?Has the patient been eating poorly because of a decreased appetite?: 1 ?Malnutrition Screening Tool Score:  3 ?Nutritional Assessment Referrals: Medication/Tx changes ? ? ?Physical Exam ?Constitutional:   ?   Appearance: Normal appearance.  ?HENT:  ?   Head: Normocephalic.  ?   Nose: Nose normal.  ?Eyes:  ?   Conjunctiva/sclera: Conjunctivae normal.  ?Cardiovascular:  ?   Rate and Rhythm: Normal rate.  ?Pulmonary:  ?   Effort: Pulmonary effort is normal.  ?Musculoskeletal:     ?   General: Normal range of motion.  ?   Cervical back: Normal range of motion.  ?Neurological:  ?   Mental Status: He is alert and oriented to person, place, and time.  ? ?Review of Systems  ?Constitutional: Negative.   ?HENT: Negative.    ?Eyes: Negative.   ?Respiratory: Negative.    ?Cardiovascular: Negative.   ?Gastrointestinal: Negative.   ?Genitourinary: Negative.   ?Musculoskeletal: Negative.   ?Skin: Negative.   ?Neurological: Negative.   ?Endo/Heme/Allergies: Negative.   ?Psychiatric/Behavioral:  Positive for depression, hallucinations, substance abuse and suicidal ideas.   ? ?Blood pressure 124/79, pulse 62, temperature 98.3 ?F (36.8 ?C), resp. rate 20, SpO2 98 %. There is no height or weight on file to calculate BMI. ? ?Past Psychiatric History: Patient has a long history of chronic mental health issues and substance abuse treatment. Has been seen multiple times in the  emergency room and also has prior admissions multiple times. History of schizoaffective, depressed type. Past history of suicide attempts and self-injury ? ? ?Is the patient at risk to self? Yes  ?Has the patient been a risk to self in the past 6 months? Yes .    ?Has the patient been a risk to self within the distant past? Yes   ?Is the patient a risk to others? No   ?Has the patient been a risk to others in the past 6 months? No   ?Has the patient been a risk to others within the distant past? No  ? ?Past Medical History:  ?Past Medical History:  ?Diagnosis Date  ? Anxiety   ? Asthma   ? Bipolar 1 disorder (HCC)   ? Chronic hepatitis C without hepatic coma (HCC)  05/28/2018  ? Depression   ? Hepatitis C   ? Hepatitis C antibody positive in blood 05/28/2018  ? Schizophrenia (HCC)   ? Sleep apnea   ?  ?Past Surgical History:  ?Procedure Laterality Date  ? CYST REMOVAL NECK    ? Arther Dames

## 2022-02-17 MED ORDER — ARIPIPRAZOLE 10 MG PO TABS
10.0000 mg | ORAL_TABLET | Freq: Every day | ORAL | 0 refills | Status: DC
Start: 2022-02-18 — End: 2022-04-12

## 2022-02-17 MED ORDER — CITALOPRAM HYDROBROMIDE 20 MG PO TABS: 20.0000 mg | ORAL_TABLET | Freq: Every day | ORAL | 0 refills | Status: DC

## 2022-02-17 MED ORDER — TRAZODONE HCL 50 MG PO TABS: 50.0000 mg | ORAL_TABLET | Freq: Every evening | ORAL | 0 refills | Status: DC | PRN

## 2022-02-17 NOTE — Discharge Instructions (Addendum)
Discharge recommendations:  ?Patient is to take medications as prescribed. ?Please see information for follow-up appointment with psychiatry and therapy. ?Please follow up with your primary care provider for all medical related needs.  ? ?Therapy: We recommend that patient participate in individual therapy to address mental health concerns. ? ?Medications: The parent/guardian is to contact a medical professional and/or outpatient provider to address any new side effects that develop. Parent/guardian should update outpatient providers of any new medications and/or medication changes.  ? ?Atypical antipsychotics: If you are prescribed an atypical antipsychotic, it is recommended that your height, weight, BMI, blood pressure, fasting lipid panel, and fasting blood sugar be monitored by your outpatient providers. ? ?Safety:  ?The patient should abstain from use of illicit substances/drugs and abuse of any medications. ?If symptoms worsen or do not continue to improve or if the patient becomes actively suicidal or homicidal then it is recommended that the patient return to the closest hospital emergency department, the [facility], or call 911 for further evaluation and treatment. ?National Suicide Prevention Lifeline 1-800-SUICIDE or 859 051 4396. ? ?About 988 ?988 offers 24/7 access to trained crisis counselors who can help people experiencing mental health-related distress. People can call or text 988 or chat 988lifeline.org for themselves or if they are worried about a loved one who may need crisis support.  ? ?TO HELP YOU MAINTAIN A SOBER LIFESTYLE, a substance use disorder treatment program may be beneficial to you.  Contact one of the following providers at your earliest opportunity to ask about enrolling in their program:  ? ?RESIDENTIAL TREATMENT PROGRAMS: ? ?     [facility] ?     [address] ?     [place], [place] [address] ?     [phone] ? ?     [facility] ?      [address] ?     [place], [place] [address] ?     [phone]  ? ?     [facility] ?     [address] ?     [place], [place] [address] ?     [phone]  ? ?RESIDENTIAL REHAB PROGRAMS - CHARITABLE: ? ?     [facility]  ?     [address]  ?     [place], [place] [address]  ?     [phone]  ? ?     [facility]  ?     [address]  ?     [place], [place] [address]  ?     [phone]  ? ?     [facility]  ?     [address]  ?     [place], [place] [address]  ?     [phone]  ? ?     [facility]  ?     [address]  ?     [place], [place] [address]  ?     [phone]  ? ?     [facility] ?     [address]  ?     [place], [place] [address]  ?     [phone]  ? ?CHEMICAL DEPENDENCY INTENSIVE OUTPATIENT PROGRAMS: ? ?     [facility] (also offers transitional housing for those in their program) ?     [address] ?     [place], [place]  [address] ?     [phone] ? ?     [facility] ?     [address] ?     [place], [place] [address] ?     [phone] ? ? FOR YOUR SHELTER NEEDS, contact the following service providers: ? ?     [facility] [employer] ?     [address] ?     [place], [place] [address] ?     [phone] ? ?     [facility] ?     [address] ?     [place], [place] [address] ?     [phone] ? ?For day shelter and other supportive services for the homeless, contact the [facility]): ? ?     [facility] ?     [address] ?     [place], [place] [address] ?     [phone] ? ?For transitional housing, Pension scheme manager.  They provide longer term housing than a shelter, but there is an application process: ? ?     Holiday representative of [employer] ?     [facility] ?     [address] ?     [place], [place] [address] ?     [phone] ? ?

## 2022-02-17 NOTE — BH Assessment (Signed)
[facility] Assessment Progress Note ?  ?Per [name], NP, this pt does not require psychiatric hospitalization at this time.  Pt is psychiatrically cleared.  Discharge instructions include referral information for area substance use disorder treatment providers, as well as supportive services for the homeless.  Patrice and pt's nurse, Candi Leash, have been notified. ? ?Doylene Canning, MA ?Triage Specialist ?[phone] ? ?

## 2022-02-17 NOTE — ED Notes (Signed)
Remains asleep  no distress. ?

## 2022-02-17 NOTE — ED Provider Notes (Signed)
FBC/OBS ASAP Discharge Summary ? ?Date and Time: 02/17/2022 11:30 AM  ?Name: Dominic [name]  ?MRN:  [id number]  ? ?Discharge Diagnoses:  ?Final diagnoses:  ?Schizoaffective disorder, depressive type (HCC)  ?Suicidal ideation  ?Substance use disorder  ? ? ?Subjective: Patient states that yesterday he was trying to get to his court appointment in [place] and that he missed the first bus that came and that's when the suicidal thoughts came to his head. He denies suicidal ideations at this time and expresses having thoughts of "I do not want to be here if I have to go to jail." Patient verbally contracts for safety to return home or to substance use treatment. He denies HI. He denies auditory or visual hallucinations. There is no objective evidence that the patient is currently responding to internal or external stimuli. He states that he is much more clear headed this morning after getting some sleep last night.  He was advised that his urine drug screen showed positive for methamphetamines, cocaine, morphine, and marijuana. He states that he started using morphine and meth 2 months ago. He reports using meth and morphine "monthly." He denies opiate withdrawal symptoms. Patient contacted list of substance use facilities.  ? ?He states that he has been in contact with his p.o officer and she said that he does not have a warrant out for his arrest. He states that he would like to go to a rehab facility outside of [place], [place], and [place], preferably in [place] for 1 year. He states that his p.o is willing to help him get into recovery treatment.  ? ?Stay Summary: Dominic [name] is a 46 year old male patient who presents to the [facility] behavioral health urgent care voluntary accompanied by law enforcement with a chief complaint of "suicidal ideations with a plan to jump in front of traffic because he does not want to be locked up." ? ?He was admitted to the [facility] for overnight observation.  Labs obtained includes CBC, CMP, BAL, A1c, lipid panel, TSH, UDS, COVID and EKG. UDS pos for methamphetamines cocaine, morphine, THC. He was restarted on Celexa 20 mg p.o. daily for depression and Abilify 10 mg p.o. nightly for mood/hallucinations. Consulted with Doylene Canning for substance use treatment options per patient's request. I discussed with patient that if he is unable to get accepted to a residential treatment facility today, then he will be discharged home and will have to follow up on his own on Monday due to the Easter Friday Holiday today. I discussed with the patient following up here at the [facility] behavioral health outpatient for medication management.  ? ?Total Time spent with patient: 15 minutes ? ?Past Psychiatric History: Patient has a long history of chronic mental health issues and substance abuse treatment. Has been seen multiple times in the emergency room and also has prior admissions multiple times. History of schizoaffective, depressed type. Past history of suicide attempts and self-injury. ? ?Past Medical History:  ?Past Medical History:  ?Diagnosis Date  ? Anxiety   ? Asthma   ? Bipolar 1 disorder (HCC)   ? Chronic hepatitis C without hepatic coma (HCC) 05/28/2018  ? Depression   ? Hepatitis C   ? Hepatitis C antibody positive in blood 05/28/2018  ? Schizophrenia (HCC)   ? Sleep apnea   ?  ?Past Surgical History:  ?Procedure Laterality Date  ? CYST REMOVAL NECK    ? neck  ? ?Family History:  ?Family History  ?Problem Relation Age of Onset  ?  Hypertension Sister   ? Schizophrenia Sister   ? ?Family Psychiatric History: No hx reported ?Social History:  ?Social History  ? ?Substance and Sexual Activity  ?Alcohol Use Yes  ? Alcohol/week: 3.0 standard drinks  ? Types: 3 Shots of liquor per week  ?   ?Social History  ? ?Substance and Sexual Activity  ?Drug Use Yes  ? Types: Cocaine, Marijuana  ?  ?Social History  ? ?Socioeconomic History  ? Marital status: Single  ?  Spouse name: Not  on file  ? Number of children: Not on file  ? Years of education: Not on file  ? Highest education level: Not on file  ?Occupational History  ? Not on file  ?Tobacco Use  ? Smoking status: Some Days  ?  Packs/day: 1.00  ?  Types: Cigarettes  ? Smokeless tobacco: Never  ?Vaping Use  ? Vaping Use: Never used  ?Substance and Sexual Activity  ? Alcohol use: Yes  ?  Alcohol/week: 3.0 standard drinks  ?  Types: 3 Shots of liquor per week  ? Drug use: Yes  ?  Types: Cocaine, Marijuana  ? Sexual activity: Not Currently  ?Other Topics Concern  ? Not on file  ?Social History Narrative  ? Not on file  ? ?Social Determinants of Health  ? ?Financial Resource Strain: Not on file  ?Food Insecurity: Not on file  ?Transportation Needs: Not on file  ?Physical Activity: Not on file  ?Stress: Not on file  ?Social Connections: Not on file  ? ?SDOH:  ?SDOH Screenings  ? ?Alcohol Screen: Low Risk   ? Last Alcohol Screening Score (AUDIT): 2  ?Depression (PHQ2-9): Medium Risk  ? PHQ-2 Score: 24  ?Financial Resource Strain: Not on file  ?Food Insecurity: Not on file  ?Housing: Not on file  ?Physical Activity: Not on file  ?Social Connections: Not on file  ?Stress: Not on file  ?Tobacco Use: High Risk  ? Smoking Tobacco Use: Some Days  ? Smokeless Tobacco Use: Never  ? Passive Exposure: Not on file  ?Transportation Needs: Not on file  ? ? ?Tobacco Cessation:  Prescription not provided because: declined  ? ?Current Medications:  ?Current Facility-Administered Medications  ?Medication Dose Route Frequency Provider Last Rate Last Admin  ? acetaminophen (TYLENOL) tablet 650 mg  650 mg Oral Q6H PRN Shelah Heatley L, NP      ? alum & mag hydroxide-simeth (MAALOX/MYLANTA) 200-200-20 MG/5ML suspension 30 mL  30 mL Oral Q4H PRN Ruth Tully L, NP      ? ARIPiprazole (ABILIFY) tablet 10 mg  10 mg Oral Daily Ava Tangney L, NP   10 mg at 02/17/22 5956  ? citalopram (CELEXA) tablet 20 mg  20 mg Oral Daily Banesa Tristan L, NP   20 mg at 02/17/22 3875   ? hydrOXYzine (ATARAX) tablet 25 mg  25 mg Oral TID PRN [name] L, NP   25 mg at 02/16/22 2351  ? loperamide (IMODIUM) capsule 2-4 mg  2-4 mg Oral PRN Lisaann Atha, Chrystine Oiler, NP      ? LORazepam (ATIVAN) tablet 1 mg  1 mg Oral Q6H PRN Thoms Barthelemy L, NP      ? magnesium hydroxide (MILK OF MAGNESIA) suspension 30 mL  30 mL Oral Daily PRN Laquanna Veazey L, NP      ? multivitamin with minerals tablet 1 tablet  1 tablet Oral Daily Misha Antonini L, NP   1 tablet at 02/17/22 0926  ? ondansetron (ZOFRAN-ODT) disintegrating tablet 4  mg  4 mg Oral Q6H PRN Farha Dano L, NP      ? thiamine tablet 100 mg  100 mg Oral Daily Dietrich Ke L, NP   100 mg at 02/17/22 0926  ? traZODone (DESYREL) tablet 50 mg  50 mg Oral QHS PRN Loreen Bankson L, NP   50 mg at 02/16/22 2351  ? ?Current Outpatient Medications  ?Medication Sig Dispense Refill  ? Multiple Vitamin (MULTIVITAMIN WITH MINERALS) TABS tablet Take 1 tablet by mouth daily.    ? [START ON 02/18/2022] ARIPiprazole (ABILIFY) 10 MG tablet Take 1 tablet (10 mg total) by mouth daily. 30 tablet 0  ? citalopram (CELEXA) 20 MG tablet Take 1 tablet (20 mg total) by mouth daily. 30 tablet 0  ? traZODone (DESYREL) 50 MG tablet Take 1 tablet (50 mg total) by mouth at bedtime as needed for sleep. 30 tablet 0  ? ? ?PTA Medications: (Not in a hospital admission) ? ? ?Musculoskeletal  ?Strength & Muscle Tone: within normal limits ?Gait & Station: normal ?Patient leans: N/A ? ?Psychiatric Specialty Exam  ?Presentation  ?General Appearance: Disheveled ? ?Eye Contact:Fair ? ?Speech:Clear and Coherent ? ?Speech Volume:Normal ? ?Handedness:Right ? ? ?Mood and Affect  ?Mood:Euthymic ? ?Affect:Congruent ? ? ?Thought Process  ?Thought Processes:Coherent; Goal Directed ? ?Descriptions of Associations:Intact ? ?Orientation:Full (Time, Place and Person) ? ?Thought Content:WDL ? Diagnosis of Schizophrenia or Schizoaffective disorder in past: Yes ? Duration of Psychotic Symptoms: Greater than six  months ? ? Hallucinations:Hallucinations: None ? ?Ideas of Reference:None ? ?Suicidal Thoughts:Suicidal Thoughts: No ? ?Homicidal Thoughts:Homicidal Thoughts: No ? ? ?Sensorium  ?Memory:Immediate Fair;

## 2022-02-20 ENCOUNTER — Other Ambulatory Visit: Payer: Self-pay

## 2022-02-20 ENCOUNTER — Encounter (HOSPITAL_COMMUNITY): Payer: Self-pay | Admitting: Emergency Medicine

## 2022-02-20 ENCOUNTER — Emergency Department (HOSPITAL_COMMUNITY)
Admission: EM | Admit: 2022-02-20 | Discharge: 2022-02-20 | Disposition: A | Discharge status: Home / Self Care | Payer: Medicaid Other | Attending: Emergency Medicine | Admitting: Emergency Medicine

## 2022-02-20 DIAGNOSIS — F332 Major depressive disorder, recurrent severe without psychotic features: Secondary | ICD-10-CM | POA: Insufficient documentation

## 2022-02-20 DIAGNOSIS — Z046 Encounter for general psychiatric examination, requested by authority: Secondary | ICD-10-CM | POA: Insufficient documentation

## 2022-02-20 DIAGNOSIS — R45851 Suicidal ideations: Secondary | ICD-10-CM | POA: Insufficient documentation

## 2022-02-20 DIAGNOSIS — F1490 Cocaine use, unspecified, uncomplicated: Secondary | ICD-10-CM | POA: Diagnosis not present

## 2022-02-20 DIAGNOSIS — F1190 Opioid use, unspecified, uncomplicated: Secondary | ICD-10-CM | POA: Diagnosis not present

## 2022-02-20 DIAGNOSIS — Z20822 Contact with and (suspected) exposure to covid-19: Secondary | ICD-10-CM | POA: Insufficient documentation

## 2022-02-20 DIAGNOSIS — F419 Anxiety disorder, unspecified: Secondary | ICD-10-CM | POA: Insufficient documentation

## 2022-02-20 DIAGNOSIS — F1910 Other psychoactive substance abuse, uncomplicated: Secondary | ICD-10-CM

## 2022-02-20 LAB — COMPREHENSIVE METABOLIC PANEL
ALT: 42 U/L (ref 0–44)
AST: 41 U/L (ref 15–41)
Albumin: 3.9 g/dL (ref 3.5–5.0)
Alkaline Phosphatase: 37 U/L — ABNORMAL LOW (ref 38–126)
Anion gap: 8 (ref 5–15)
BUN: 14 mg/dL (ref 6–20)
CO2: 23 mmol/L (ref 22–32)
Calcium: 9.4 mg/dL (ref 8.9–10.3)
Chloride: 106 mmol/L (ref 98–111)
Creatinine, Ser: 0.88 mg/dL (ref 0.61–1.24)
GFR, Estimated: >60 mL/min (ref >60)
Glucose, Bld: 134 mg/dL — ABNORMAL HIGH (ref 70–99)
Potassium: 3.5 mmol/L (ref 3.5–5.1)
Sodium: 137 mmol/L (ref 135–145)
Total Bilirubin: 0.9 mg/dL (ref 0.3–1.2)
Total Protein: 7.9 g/dL (ref 6.5–8.1)

## 2022-02-20 LAB — CBC
HCT: 42.4 % (ref 39.0–52.0)
Hemoglobin: 15.6 g/dL (ref 13.0–17.0)
MCH: 29.8 pg (ref 26.0–34.0)
MCHC: 36.8 g/dL — ABNORMAL HIGH (ref 30.0–36.0)
MCV: 80.9 fL (ref 80.0–100.0)
Platelets: 226 10*3/uL (ref 150–400)
RBC: 5.24 MIL/uL (ref 4.22–5.81)
RDW: 14.6 % (ref 11.5–15.5)
WBC: 11.3 10*3/uL — ABNORMAL HIGH (ref 4.0–10.5)
nRBC: 0 % (ref 0.0–0.2)

## 2022-02-20 LAB — ACETAMINOPHEN LEVEL: Acetaminophen (Tylenol), Serum: <10 ug/mL — ABNORMAL LOW (ref 10–30)

## 2022-02-20 LAB — SALICYLATE LEVEL: Salicylate Lvl: <7 mg/dL — ABNORMAL LOW (ref 7.0–30.0)

## 2022-02-20 LAB — ETHANOL: Alcohol, Ethyl (B): <10 mg/dL (ref <10)

## 2022-02-20 LAB — RESP PANEL BY RT-PCR (FLU A&B, COVID) ARPGX2
Influenza A by PCR: NEGATIVE
Influenza B by PCR: NEGATIVE
SARS Coronavirus 2 by RT PCR: NEGATIVE

## 2022-02-20 MED ORDER — NICOTINE 21 MG/24HR TD PT24
21.0000 mg | MEDICATED_PATCH | Freq: Every day | TRANSDERMAL | Status: DC
  Administered 2022-02-20: 21 mg via TRANSDERMAL
  Filled 2022-02-20: qty 1

## 2022-02-20 MED ORDER — ARIPIPRAZOLE 10 MG PO TABS
10.0000 mg | ORAL_TABLET | Freq: Every day | ORAL | Status: DC
  Administered 2022-02-20: 10 mg via ORAL
  Filled 2022-02-20: qty 1

## 2022-02-20 MED ORDER — RISPERIDONE 1 MG PO TBDP: 2.0000 mg | ORAL_TABLET | Freq: Three times a day (TID) | ORAL | Status: DC | PRN

## 2022-02-20 MED ORDER — ALUM & MAG HYDROXIDE-SIMETH 200-200-20 MG/5ML PO SUSP: 30.0000 mL | Freq: Four times a day (QID) | ORAL | Status: DC | PRN

## 2022-02-20 MED ORDER — LORAZEPAM 1 MG PO TABS
1.0000 mg | ORAL_TABLET | ORAL | Status: AC | PRN
  Administered 2022-02-20: 1 mg via ORAL
  Filled 2022-02-20: qty 1

## 2022-02-20 MED ORDER — ZIPRASIDONE MESYLATE 20 MG IM SOLR: 20.0000 mg | INTRAMUSCULAR | Status: DC | PRN

## 2022-02-20 MED ORDER — ZOLPIDEM TARTRATE 5 MG PO TABS: 5.0000 mg | ORAL_TABLET | Freq: Every evening | ORAL | Status: DC | PRN

## 2022-02-20 MED ORDER — CITALOPRAM HYDROBROMIDE 10 MG PO TABS
20.0000 mg | ORAL_TABLET | Freq: Every day | ORAL | Status: DC
  Administered 2022-02-20: 20 mg via ORAL
  Filled 2022-02-20: qty 2

## 2022-02-20 MED ORDER — ONDANSETRON HCL 4 MG PO TABS: 4.0000 mg | ORAL_TABLET | Freq: Three times a day (TID) | ORAL | Status: DC | PRN

## 2022-02-20 MED ORDER — ACETAMINOPHEN 325 MG PO TABS: 650.0000 mg | ORAL_TABLET | ORAL | Status: DC | PRN
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
## 2022-02-20 NOTE — ED Notes (Signed)
Breakfast order placed ?

## 2022-02-20 NOTE — BH Assessment (Signed)
[facility] Assessment Progress Note ?  ?Per Shuvon Rankin, NP, this pt does not require psychiatric hospitalization at this time.  Pt is psychiatrically cleared.  Discharge instructions include referral information for [facility] for opioid replacement therapy, and [facility] for primary care.  Pt's nurse, Hope, has been notified. ? ?Doylene Canning, MA ?Triage Specialist ?[phone] ? ?

## 2022-02-20 NOTE — ED Provider Notes (Signed)
?  Physical Exam  ?BP 123/77 (BP Location: Right Arm)   Pulse 81   Temp 98.5 ?F (36.9 ?C) (Oral)   Resp 18   Ht 5\' 11"  (1.803 m)   Wt 97 kg   SpO2 98%   BMI 29.83 kg/m?  ? ?Physical Exam ? ?Procedures  ?Procedures ? ?ED Course / MDM  ?  ?Medical Decision Making ?Amount and/or Complexity of Data Reviewed ?Labs: ordered. ? ?Risk ?OTC drugs. ?Prescription drug management. ? ? ?Patient was seen by psychiatry this morning and recommend discharge.  Stable for discharge.  Medically cleared by previous provider ? ? ?  ?Drenda Freeze, MD ?02/20/22 1149 ? ?

## 2022-02-20 NOTE — Discharge Instructions (Addendum)
Avoid using drugs. ? ?Take your meds as prescribed by your doctor ? ?Please go to behavioral health for follow-up ? ?Return to ER if you have thoughts of harming yourself or others, hallucination. ? ?For opioid replacement therapy contact [facility] at your earliest opportunity: ?     [facility] ?     [address] ?     [place], [place] [address] ?     [phone] ? ?For primary care contact [facility]: ?     [facility] ?     [address] ?     [place], [place] [address]  ?     [phone]  ?

## 2022-02-20 NOTE — ED Notes (Signed)
Pt belongings inventoried and placed in locker 1 

## 2022-02-20 NOTE — ED Notes (Signed)
Provided pt with bus pass. °

## 2022-02-20 NOTE — ED Notes (Signed)
Pt given all his belongings and discharge paperwork. Pt given [place] sandwich and drink. Pt states he will not harm himself. ?

## 2022-02-20 NOTE — ED Triage Notes (Addendum)
Pt brought in by [employer] for SI. Pt endorses cocaine and fentanyl. ?
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
## 2022-02-20 NOTE — ED Notes (Signed)
Pt has ankle monitor charger with him, [employer] states to keep with pt so that he can charge his ankle monitor.  ?

## 2022-02-20 NOTE — BH Assessment (Addendum)
Comprehensive Clinical Assessment (CCA) Note ? ?02/20/2022 ?Dominic SloughCedric Deon [name] ?[id number] ? ?DISPOSITION: [place] NP recommends patient be discharged with resources. This Clinical research associatewriter spoke to patient at length in reference to area providers to assist with medication assistance that could provide medications at no charge and also OP SA programs to assist with ongoing use.  ? ?Flowsheet Row ED from 02/20/2022 [facility]  ?C-SSRS RISK CATEGORY No Risk High Risk Error: Q3, 4, or 5 should not be populated when Q2 is No  ? ?  ? The patient demonstrates the following risk factors for suicide: Chronic risk factors for suicide include: N/A. Acute risk factors for suicide include: N/A. Protective factors for this patient include: coping skills. Considering these factors, the overall suicide risk at this point appears to be low. Patient is appropriate for outpatient follow up.   ? ?Patient is a 46 year old male that presents this date with ongoing SA issues and medication compliance due to not having finances. Patient is denying any S/I, H/I or AVH. Patient has a prior diagnosis of MDD and SA issues. Patient states that he is on probation and is wearing an ankle monitor. Patient reports ongoing issues associated with his probation and current fees. Patient states he often feels frustrated and reports that he is overwhelmed and states that he does not want to go back to prison. Patient admits to use of alcohol, cocaine and marijuana daily and states that he is "tired of that lifestyle."  Patient states that he is using a gram of cocaine daily along with various amounts of opiates. Patient is vague in reference to use patterns and time frame. Patient is currently not exhibiting any withdrawal symptoms. ?  ?Patient is alert and oriented x 4. His judgment, insight and  impulse control are intact with thoughts organized. His speech is normal in tone and rate and his eye contact is good. ?  ? ?Chief Complaint:  ?Chief Complaint  ?Patient presents with  ? Psychiatric Evaluation  ? ?Visit Diagnosis: MDD recurrent without psychotic features, severe, Cocaine use, Opiate use   ? ? ?CCA Screening, Triage and Referral (STR) ? ?Patient Reported Information ?How did you hear about us? Self ? ?What Is the Reason for Your Visit/Call Today? Pt presents with ongoing SA issues ? ?How Long Has This Been Causing You Problems? > than 6 months ? ?What Do You Feel Would Help You the Most Today? Alcohol or Drug Use Treatment ? ? ?Have You Recently Had Any Thoughts About Hurting Yourself? No ? ?Are You Planning to Commit Suicide/Harm Yourself At This time? No ? ? ?Have you Recently Had Thoughts About Hurting Someone Karolee Ohslse? No ? ?Are You Planning to Harm Someone at This Time? No ? ?Explanation: No data recorded ? ?Have You Used Any Alcohol or Drugs in the Past 24 Hours? Yes ? ?How Long Ago Did You Use Drugs or Alcohol? No data recorded ?What Did You Use and How Much? Pt reports using a unknown amount of oipiates and cocaine prior to arrival. UDS pending. Patient is vague in reference to amounts used. ? ? ?Do You Currently Have a Therapist/Psychiatrist? No ? ?Name of Therapist/Psychiatrist: [facility] ? ? ?Have You Been Recently Discharged From Any Office Practice or Programs? No ? ?Explanation of Discharge From Practice/Program: NA ? ? ?  ?CCA Screening Triage Referral Assessment ?Type  of Contact: Face-to-Face ? ?Telemedicine Service Delivery: Telemedicine service delivery: This service was provided via telemedicine using a 2-way, interactive audio and video technology ? ?Is this Initial or Reassessment? Initial Assessment ? ?Date Telepsych consult ordered in [facility]:  02/20/22 ? ?Time Telepsych consult ordered in [facility]:  0238 ? ?Location of Assessment: [facility] ED ? ?Provider Location: [facility]  Assessment Services ? ? ?Collateral Involvement: None at this time ? ? ?Does Patient Have a Automotive engineer Guardian? No data recorded ?Name and Contact of Legal Guardian: No data recorded ?If Minor and Not Living with Parent(s), Who has Custody? NA ? ?Is CPS involved or ever been involved? Never ? ?Is APS involved or ever been involved? Never ? ? ?Patient Determined To Be At Risk for Harm To Self or Others Based on Review of Patient Reported Information or Presenting Complaint? No ? ?Method: No data recorded ?Availability of Means: No data recorded ?Intent: No data recorded ?Notification Required: No data recorded ?Additional Information for Danger to Others Potential: No data recorded ?Additional Comments for Danger to Others Potential: No data recorded ?Are There Guns or Other Weapons in Your Home? No data recorded ?Types of Guns/Weapons: No data recorded ?Are These Weapons Safely Secured?                            No data recorded ?Who Could Verify You Are Able To Have These Secured: No data recorded ?Do You Have any Outstanding Charges, Pending Court Dates, Parole/Probation? No data recorded ?Contacted To Inform of Risk of Harm To Self or Others: Other: Comment (NA) ? ? ? ?Does Patient Present under Involuntary Commitment? No ? ?IVC Papers Initial File Date: 01/09/22 ? ? ?[place] of Residence: [place] ? ? ?Patient Currently Receiving the Following Services: Not Receiving Services ? ? ?Determination of Need: Routine (7 days) ? ? ?Options For Referral: Outpatient Therapy ? ? ? ? ?CCA Biopsychosocial ?Patient Reported Schizophrenia/Schizoaffective Diagnosis in Past: No ? ? ?Strengths: pt is willing to participate in treatment ? ? ?Mental Health Symptoms ?Depression:   ?Change in energy/activity; Hopelessness; Difficulty Concentrating ?  ?Duration of Depressive symptoms:  ?Duration of Depressive Symptoms: Greater than two weeks ?  ?Mania:   ?None ?  ?Anxiety:    ?Restlessness; Difficulty concentrating ?   ?Psychosis:   ?None ?  ?Duration of Psychotic symptoms:    ?Trauma:   ?N/A ?  ?Obsessions:   ?N/A ?  ?Compulsions:   ?N/A ?  ?Inattention:   ?None ?  ?Hyperactivity/Impulsivity:   ?Feeling of restlessness; Fidgets with hands/feet ?  ?Oppositional/Defiant Behaviors:   ?None (has a pending assault charge) ?  ?Emotional Irregularity:   ?Chronic feelings of emptiness ?  ?Other Mood/Personality Symptoms:   ?NA ?  ? ?Mental Status Exam ?Appearance and self-care  ?Stature:   ?Average ?  ?Weight:   ?Average weight ?  ?Clothing:   ?Neat/clean (Pt in scrubs.) ?  ?Grooming:   ?Normal ?  ?Cosmetic use:   ?None ?  ?Posture/gait:   ?Normal ?  ?Motor activity:   ?Restless ?  ?Sensorium  ?Attention:   ?Normal ?  ?Concentration:   ?Normal ?  ?Orientation:   ?X5 ?  ?Recall/memory:   ?Normal ?  ?Affect and Mood  ?Affect:   ?Appropriate ?  ?Mood:   ?Anxious ?  ?Relating  ?Eye contact:   ?Normal ?  ?Facial expression:   ?Responsive ?  ?Attitude toward examiner:   ?Cooperative ?  ?  Thought and Language  ?Speech flow:  ?Clear and Coherent ?  ?Thought content:   ?Appropriate to Mood and Circumstances ?  ?Preoccupation:   ?None ?  ?Hallucinations:   ?None ?  ?Organization:  No data recorded  ?Executive Functions  ?Fund of Knowledge:   ?Average ?  ?Intelligence:   ?Average ?  ?Abstraction:   ?Functional ?  ?Judgement:   ?Normal ?  ?Reality Testing:   ?Adequate ?  ?Insight:   ?Fair ?  ?Decision Making:   ?Normal ?  ?Social Functioning  ?Social Maturity:   ?Responsible ?  ?Social Judgement:   ?"Street Smart" ?  ?Stress  ?Stressors:   ?Other (Comment) ?  ?Coping Ability:   ?Overwhelmed; Deficient supports ?  ?Skill Deficits:   ?Decision making; Self-control; Responsibility ?  ?Supports:   ?Support needed ?  ? ? ?Religion: ?Religion/Spirituality ?Are You A Religious Person?: Yes ?What is Your Religious Affiliation?: Baptist ?How Might This Affect Treatment?: N/A ? ?Leisure/Recreation: ?Leisure / Recreation ?Do You Have Hobbies?: Yes ?Leisure and  Hobbies: Rap, playing basketball. ? ?Exercise/Diet: ?Exercise/Diet ?Do You Exercise?: No ?Have You Gained or Lost A Significant Amount of Weight in the Past Six Months?: No ?Do You Follow a Special Diet?: No ?Do

## 2022-02-24 ENCOUNTER — Telehealth (HOSPITAL_COMMUNITY): Payer: Self-pay | Admitting: Physician Assistant

## 2022-02-24 NOTE — Telephone Encounter (Signed)
IR[facility] case manager called back, Clinical research associate provider with walk-in information. She will have patient come in during walk-in hours.  ?

## 2022-02-24 NOTE — Telephone Encounter (Signed)
Requesting patient be rescheduled an appointment for evaluation.

## 2022-02-24 NOTE — Telephone Encounter (Signed)
Tobe Sos, case manager at [facility], [phone], called with patient present. Reports patient was given ABILIFY injection in hospital on 02/17/22.   ?Please review and confirm medication needed and call [facility] rep back to advise if RX will be sent to pharmacy & when shot clinic appt for May will be. ? ?PATIENT HASN'T BEEN SEEN SINCE 07/27/21 WOULD NEED TO HAVE A NEW-EVAL WITH PROVIDER TO DETERMINE MOVING FORWARD  & TO SCHEDULE APPT. ?

## 2022-02-24 NOTE — Telephone Encounter (Signed)
Tobe Sos, case manager at [facility], [phone], called with patient present. Reports patient was given ABILIFY injection in hospital on 02/17/22.   Please review and confirm medication needed and call [facility] rep back to advise if RX will be sent to pharmacy & when shot clinic appt for May will be.

## 2022-02-24 NOTE — Telephone Encounter (Signed)
Message acknowledged and reviewed.

## 2022-03-10 ENCOUNTER — Telehealth (HOSPITAL_COMMUNITY): Payer: Self-pay | Admitting: *Deleted

## 2022-03-10 ENCOUNTER — Other Ambulatory Visit: Payer: Self-pay

## 2022-03-10 ENCOUNTER — Ambulatory Visit (HOSPITAL_COMMUNITY)
Admission: EM | Admit: 2022-03-10 | Discharge: 2022-03-11 | Disposition: A | Discharge status: Home / Self Care | Payer: Medicaid Other | Source: Home / Self Care

## 2022-03-10 ENCOUNTER — Emergency Department (HOSPITAL_COMMUNITY)
Admission: EM | Admit: 2022-03-10 | Discharge: 2022-03-10 | Disposition: A | Discharge status: Home / Self Care | Payer: Medicaid Other | Attending: Emergency Medicine | Admitting: Emergency Medicine

## 2022-03-10 ENCOUNTER — Ambulatory Visit (HOSPITAL_COMMUNITY)
Admission: EM | Admit: 2022-03-10 | Discharge: 2022-03-10 | Disposition: A | Discharge status: Home / Self Care | Payer: Medicaid Other | Source: Home / Self Care

## 2022-03-10 ENCOUNTER — Encounter (HOSPITAL_COMMUNITY): Payer: Self-pay | Admitting: Emergency Medicine

## 2022-03-10 DIAGNOSIS — R45851 Suicidal ideations: Secondary | ICD-10-CM | POA: Insufficient documentation

## 2022-03-10 DIAGNOSIS — F339 Major depressive disorder, recurrent, unspecified: Secondary | ICD-10-CM | POA: Insufficient documentation

## 2022-03-10 DIAGNOSIS — F1290 Cannabis use, unspecified, uncomplicated: Secondary | ICD-10-CM | POA: Insufficient documentation

## 2022-03-10 DIAGNOSIS — F1010 Alcohol abuse, uncomplicated: Secondary | ICD-10-CM

## 2022-03-10 DIAGNOSIS — E876 Hypokalemia: Secondary | ICD-10-CM | POA: Insufficient documentation

## 2022-03-10 DIAGNOSIS — F329 Major depressive disorder, single episode, unspecified: Secondary | ICD-10-CM | POA: Insufficient documentation

## 2022-03-10 DIAGNOSIS — F1410 Cocaine abuse, uncomplicated: Secondary | ICD-10-CM | POA: Insufficient documentation

## 2022-03-10 DIAGNOSIS — F1990 Other psychoactive substance use, unspecified, uncomplicated: Secondary | ICD-10-CM

## 2022-03-10 DIAGNOSIS — F1910 Other psychoactive substance abuse, uncomplicated: Secondary | ICD-10-CM | POA: Insufficient documentation

## 2022-03-10 DIAGNOSIS — F1510 Other stimulant abuse, uncomplicated: Secondary | ICD-10-CM

## 2022-03-10 DIAGNOSIS — Z5900 Homelessness unspecified: Secondary | ICD-10-CM | POA: Insufficient documentation

## 2022-03-10 DIAGNOSIS — Z62810 Personal history of physical and sexual abuse in childhood: Secondary | ICD-10-CM | POA: Insufficient documentation

## 2022-03-10 DIAGNOSIS — F1994 Other psychoactive substance use, unspecified with psychoactive substance-induced mood disorder: Secondary | ICD-10-CM | POA: Insufficient documentation

## 2022-03-10 DIAGNOSIS — F22 Delusional disorders: Secondary | ICD-10-CM | POA: Insufficient documentation

## 2022-03-10 DIAGNOSIS — F1420 Cocaine dependence, uncomplicated: Secondary | ICD-10-CM

## 2022-03-10 DIAGNOSIS — F430 Acute stress reaction: Secondary | ICD-10-CM

## 2022-03-10 DIAGNOSIS — Z20822 Contact with and (suspected) exposure to covid-19: Secondary | ICD-10-CM | POA: Insufficient documentation

## 2022-03-10 LAB — POCT URINE DRUG SCREEN - MANUAL ENTRY (I-SCREEN)
POC Amphetamine UR: NOT DETECTED
POC Buprenorphine (BUP): NOT DETECTED
POC Cocaine UR: POSITIVE — AB
POC Marijuana UR: POSITIVE — AB
POC Methadone UR: NOT DETECTED
POC Methamphetamine UR: NOT DETECTED
POC Morphine: NOT DETECTED
POC Oxazepam (BZO): NOT DETECTED
POC Oxycodone UR: NOT DETECTED
POC Secobarbital (BAR): NOT DETECTED

## 2022-03-10 LAB — URINALYSIS, ROUTINE W REFLEX MICROSCOPIC
Bilirubin Urine: NEGATIVE
Glucose, UA: NEGATIVE mg/dL
Hgb urine dipstick: NEGATIVE
Ketones, ur: NEGATIVE mg/dL
Nitrite: NEGATIVE
Protein, ur: NEGATIVE mg/dL
Specific Gravity, Urine: 1.028 (ref 1.005–1.030)
pH: 5 (ref 5.0–8.0)

## 2022-03-10 LAB — RAPID URINE DRUG SCREEN, HOSP PERFORMED
Amphetamines: NOT DETECTED
Barbiturates: NOT DETECTED
Benzodiazepines: NOT DETECTED
Cocaine: POSITIVE — AB
Opiates: NOT DETECTED
Tetrahydrocannabinol: POSITIVE — AB

## 2022-03-10 LAB — COMPREHENSIVE METABOLIC PANEL
ALT: 37 U/L (ref 0–44)
AST: 40 U/L (ref 15–41)
Albumin: 4.1 g/dL (ref 3.5–5.0)
Alkaline Phosphatase: 36 U/L — ABNORMAL LOW (ref 38–126)
Anion gap: 6 (ref 5–15)
BUN: 9 mg/dL (ref 6–20)
CO2: 26 mmol/L (ref 22–32)
Calcium: 9.1 mg/dL (ref 8.9–10.3)
Chloride: 106 mmol/L (ref 98–111)
Creatinine, Ser: 0.74 mg/dL (ref 0.61–1.24)
GFR, Estimated: >60 mL/min (ref >60)
Glucose, Bld: 68 mg/dL — ABNORMAL LOW (ref 70–99)
Potassium: 3.4 mmol/L — ABNORMAL LOW (ref 3.5–5.1)
Sodium: 138 mmol/L (ref 135–145)
Total Bilirubin: 0.9 mg/dL (ref 0.3–1.2)
Total Protein: 7.4 g/dL (ref 6.5–8.1)

## 2022-03-10 LAB — POC SARS CORONAVIRUS 2 AG: SARSCOV2ONAVIRUS 2 AG: NEGATIVE

## 2022-03-10 LAB — CBC
HCT: 41 % (ref 39.0–52.0)
Hemoglobin: 15 g/dL (ref 13.0–17.0)
MCH: 29.9 pg (ref 26.0–34.0)
MCHC: 36.6 g/dL — ABNORMAL HIGH (ref 30.0–36.0)
MCV: 81.7 fL (ref 80.0–100.0)
Platelets: 227 10*3/uL (ref 150–400)
RBC: 5.02 MIL/uL (ref 4.22–5.81)
RDW: 14.6 % (ref 11.5–15.5)
WBC: 9.5 10*3/uL (ref 4.0–10.5)
nRBC: 0 % (ref 0.0–0.2)

## 2022-03-10 LAB — RESP PANEL BY RT-PCR (FLU A&B, COVID) ARPGX2
Influenza A by PCR: NEGATIVE
Influenza B by PCR: NEGATIVE
SARS Coronavirus 2 by RT PCR: NEGATIVE

## 2022-03-10 LAB — ACETAMINOPHEN LEVEL: Acetaminophen (Tylenol), Serum: <10 ug/mL — ABNORMAL LOW (ref 10–30)

## 2022-03-10 LAB — SALICYLATE LEVEL: Salicylate Lvl: <7 mg/dL — ABNORMAL LOW (ref 7.0–30.0)

## 2022-03-10 LAB — ETHANOL: Alcohol, Ethyl (B): <10 mg/dL (ref <10)

## 2022-03-10 MED ORDER — CITALOPRAM HYDROBROMIDE 20 MG PO TABS
20.0000 mg | ORAL_TABLET | Freq: Once | ORAL | Status: AC
  Administered 2022-03-10: 20 mg via ORAL
  Filled 2022-03-10: qty 1

## 2022-03-10 MED ORDER — ACETAMINOPHEN 325 MG PO TABS: 650.0000 mg | ORAL_TABLET | Freq: Four times a day (QID) | ORAL | Status: DC | PRN

## 2022-03-10 MED ORDER — MAGNESIUM HYDROXIDE 400 MG/5ML PO SUSP: 30.0000 mL | Freq: Every day | ORAL | Status: DC | PRN

## 2022-03-10 MED ORDER — TRAZODONE HCL 50 MG PO TABS: 50.0000 mg | ORAL_TABLET | Freq: Every evening | ORAL | Status: DC | PRN

## 2022-03-10 MED ORDER — ALUM & MAG HYDROXIDE-SIMETH 200-200-20 MG/5ML PO SUSP: 30.0000 mL | ORAL | Status: DC | PRN

## 2022-03-10 NOTE — Progress Notes (Addendum)
Pt's UDS is positive for Cocaine and THC. ?

## 2022-03-10 NOTE — Discharge Instructions (Addendum)
It was our pleasure to provide your ER care today - we hope that you feel better. ? ?Avoid substance/drug use as it is harmful to your physical health and mental well-being.  See attached resource guides for help accessing substance use treatment programs, behavioral health therapy, and other social service resources.  ? ?For mental health issues and/or crisis, you may go directly to the Behavioral Health Urgent Care Center - it is open 24/7 and walk-ins are welcome. ? ?From today's labs your potassium level is slightly low - eat plenty of fruits and vegetables, and follow up with primary care doctor.  ? ?Return to ER if worse, new symptoms, fevers, chest pain, trouble breathing, or other concern.  ?

## 2022-03-10 NOTE — ED Triage Notes (Signed)
Pt reports SI stating he is going to jump off the [place], reports hearing voices and visual hallucinations, when asked how long pt states "forever" ?
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
## 2022-03-10 NOTE — ED Triage Notes (Signed)
Pt presents to [facility] voluntarily escorted by [employer]. Per [employer] pt was picked up from [facility] after being treated and discharged with complaints of hallucinations and ongoing SI. Pt states " I just need to get my medication". Pt states that he has been feeling suicidal with a plan to jump in front of traffic. Pt states that he is having auditory hallucinations currently, a voice telling him to "F the world". Pt denies HI. ?
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
## 2022-03-10 NOTE — ED Notes (Signed)
Attempted to take pt VS for d/c and pt refused. Provided pt with his belongings and d/c papers. Pt refused for this RN to go over d/c papers with him. Refused to go to the bathroom and change his close. Started pacing the floor yelling and using foul language and throwing his paperwork across the hall. Security requested at this time to help assist with d/c pt at this time.  ?

## 2022-03-10 NOTE — ED Provider Notes (Addendum)
?[facility] EMERGENCY DEPARTMENT ?Provider Note ? ? ?CSN: [id number] ?Arrival date & time: 03/10/22  0357 ? ?  ? ?History ? ?Chief Complaint  ?Patient presents with  ? V70.1  ? ? ?Tion Deon Enfield is a 46 y.o. male. ? ?Patient c/o being stressed out and feeling depressed about substance abuse and break up of relationship a couple months ago, as well as housing instability. Symptoms gradual onset, waxing/waning.  Denies acute stressor tonight. Normal appetite - is asking for food/drink now. No trouble sleeping. No wt loss. Denies trying to harm self or wanting to harm self. Had transient SI earlier.  ? ?The history is provided by the patient and medical records.  ? ?  ? ?Home Medications ?Prior to Admission medications   ?Medication Sig Start Date End Date Taking? Authorizing Provider  ?ARIPiprazole (ABILIFY) 10 MG tablet Take 1 tablet (10 mg total) by mouth daily. 02/18/22   White, Patrice L, NP  ?citalopram (CELEXA) 20 MG tablet Take 1 tablet (20 mg total) by mouth daily. 02/17/22   Layla Barter, NP  ?Multiple Vitamin (MULTIVITAMIN WITH MINERALS) TABS tablet Take 1 tablet by mouth daily.    [provider]  ?traZODone (DESYREL) 50 MG tablet Take 1 tablet (50 mg total) by mouth at bedtime as needed for sleep. 02/17/22   Layla Barter, NP  ?   ? ?Allergies    ?Patient has no known allergies.   ? ?Review of Systems   ?Review of Systems  ?Constitutional:  Negative for fever.  ?HENT:  Negative for sore throat.   ?Eyes:  Negative for redness.  ?Respiratory:  Negative for shortness of breath.   ?Cardiovascular:  Negative for chest pain.  ?Gastrointestinal:  Negative for abdominal pain.  ?Genitourinary:  Negative for flank pain.  ?Musculoskeletal:  Negative for back pain and neck pain.  ?Skin:  Negative for rash.  ?Neurological:  Negative for headaches.  ?Hematological:  Does not bruise/bleed easily.  ?Psychiatric/Behavioral:  Positive for dysphoric mood. Negative for self-injury. The patient  is nervous/anxious.   ? ?Physical Exam ?Updated Vital Signs ?BP 106/82 (BP Location: Right Arm)   Pulse (!) 59   Temp 97.8 ?F (36.6 ?C) (Oral)   Resp (!) 22   Ht 1.803 m (5\' 11" )   Wt 96.6 kg   SpO2 99%   BMI 29.71 kg/m?  ?Physical Exam ?Vitals and nursing note reviewed.  ?Constitutional:   ?   Appearance: Normal appearance. He is well-developed.  ?HENT:  ?   Head: Atraumatic.  ?   Nose: Nose normal.  ?   Mouth/Throat:  ?   Mouth: Mucous membranes are moist.  ?   Pharynx: Oropharynx is clear.  ?Eyes:  ?   General: No scleral icterus. ?   Conjunctiva/sclera: Conjunctivae normal.  ?   Pupils: Pupils are equal, round, and reactive to light.  ?Neck:  ?   Trachea: No tracheal deviation.  ?Cardiovascular:  ?   Rate and Rhythm: Normal rate and regular rhythm.  ?   Pulses: Normal pulses.  ?   Heart sounds: Normal heart sounds. No murmur heard. ?  No friction rub. No gallop.  ?Pulmonary:  ?   Effort: Pulmonary effort is normal. No accessory muscle usage or respiratory distress.  ?   Breath sounds: Normal breath sounds.  ?Abdominal:  ?   General: Bowel sounds are normal. There is no distension.  ?   Palpations: Abdomen is soft.  ?   Tenderness: There is no  abdominal tenderness.  ?Genitourinary: ?   Comments: No cva tenderness. ?Musculoskeletal:     ?   General: No swelling.  ?   Cervical back: Normal range of motion and neck supple. No rigidity.  ?Skin: ?   General: Skin is warm and dry.  ?   Findings: No rash.  ?Neurological:  ?   Mental Status: He is alert.  ?   Comments: Alert, speech clear. Steady gait.   ?Psychiatric:     ?   Mood and Affect: Mood normal.  ? ? ?ED Results / Procedures / Treatments   ?Labs ?(all labs ordered are listed, but only abnormal results are displayed) ?Results for orders placed or performed during the hospital encounter of 03/10/22  ?Comprehensive metabolic panel  ?Result Value Ref Range  ? Sodium 138 135 - 145 mmol/L  ? Potassium 3.4 (L) 3.5 - 5.1 mmol/L  ? Chloride 106 98 - 111 mmol/L  ?  CO2 26 22 - 32 mmol/L  ? Glucose, Bld 68 (L) 70 - 99 mg/dL  ? BUN 9 6 - 20 mg/dL  ? Creatinine, Ser 0.74 0.61 - 1.24 mg/dL  ? Calcium 9.1 8.9 - 10.3 mg/dL  ? Total Protein 7.4 6.5 - 8.1 g/dL  ? Albumin 4.1 3.5 - 5.0 g/dL  ? AST 40 15 - 41 U/L  ? ALT 37 0 - 44 U/L  ? Alkaline Phosphatase 36 (L) 38 - 126 U/L  ? Total Bilirubin 0.9 0.3 - 1.2 mg/dL  ? GFR, Estimated >60 >60 mL/min  ? Anion gap 6 5 - 15  ?Ethanol  ?Result Value Ref Range  ? Alcohol, Ethyl (B) <10 <10 mg/dL  ?Salicylate level  ?Result Value Ref Range  ? Salicylate Lvl <7.0 (L) 7.0 - 30.0 mg/dL  ?Acetaminophen level  ?Result Value Ref Range  ? Acetaminophen (Tylenol), Serum <10 (L) 10 - 30 ug/mL  ?cbc  ?Result Value Ref Range  ? WBC 9.5 4.0 - 10.5 K/uL  ? RBC 5.02 4.22 - 5.81 MIL/uL  ? Hemoglobin 15.0 13.0 - 17.0 g/dL  ? HCT 41.0 39.0 - 52.0 %  ? MCV 81.7 80.0 - 100.0 fL  ? MCH 29.9 26.0 - 34.0 pg  ? MCHC 36.6 (H) 30.0 - 36.0 g/dL  ? RDW 14.6 11.5 - 15.5 %  ? Platelets 227 150 - 400 K/uL  ? nRBC 0.0 0.0 - 0.2 %  ?Rapid urine drug screen (hospital performed)  ?Result Value Ref Range  ? Opiates NONE DETECTED NONE DETECTED  ? Cocaine POSITIVE (A) NONE DETECTED  ? Benzodiazepines NONE DETECTED NONE DETECTED  ? Amphetamines NONE DETECTED NONE DETECTED  ? Tetrahydrocannabinol POSITIVE (A) NONE DETECTED  ? Barbiturates NONE DETECTED NONE DETECTED  ? ? ? ?EKG ?None ? ?Radiology ?No results found. ? ?Procedures ?Procedures  ? ? ?Medications Ordered in ED ?Medications - No data to display ? ?ED Course/ Medical Decision Making/ A&P ?  ?                        ?Medical Decision Making ?Problems Addressed: ?Cocaine use disorder [facility]): acute illness or injury ?   Details: acute/chronic ?Hypokalemia: acute illness or injury ?Stress reaction causing mixed disturbance of emotion and conduct: acute illness or injury with systemic symptoms ?Substance induced mood disorder (HCC): acute illness or injury with systemic symptoms that poses a threat to life or bodily  functions ?Substance use disorder: acute illness or injury with systemic symptoms that poses a threat  to life or bodily functions ?   Details: acute on chronic ? ?Amount and/or Complexity of Data Reviewed ?External Data Reviewed: labs and notes. ?Labs: ordered. Decision-making details documented in ED Course. ? ?Risk ?Prescription drug management. ? ? ?Labs sent.  ? ?Reviewed nursing notes and prior charts for additional history. Multiple prior ED visits w substance use disorder and related mood disorder/SI. Dispo decision including potential need for admission considered - will get labs, observe, and reassess and revisit dispo decision.  ? ?Pt asking for food/drink - provided to him. ? ?Labs reviewed/interpreted by me - wbc and hgb normal. K sl low. Kcl po.  ? ?Pt has eaten/drank, is conversant, smiling and joking. Pt does not appear to be acutely depressed or despondent. He has normal mood and affect. Pt does not voice any thoughts of harm to self or others. Pt is not responding to internal stimuli - no hallucinations/delusions or acute psychosis noted.  ? ?Symptoms/exam appear most c/w substance use disorder and associated mood disorder. ? ?Reviewed pts prescription meds, and discussed need to continue prescription meds. ? ?Pt currently appears stable for d/c. ? ?Will provide resources guides for close f/u as outpt in terms of substance use resources, [facility] resources, and other social service resources. ? ?Rec close outpt pcp and bh f/u. ? ? ? ? ? ? ? ? ? ?Final Clinical Impression(s) / ED Diagnoses ?Final diagnoses:  ?None  ? ? ?Rx / DC Orders ?ED Discharge Orders   ? ? None  ? ?  ? ? ?  ? ?  ?Cathren LaineSteinl, Eliana Lueth, MD ?03/10/22 [phone] ? ?

## 2022-03-10 NOTE — Discharge Instructions (Signed)
?  Please come to [facility] (this facility) during walk in hours for appointment with psychiatrist/provider for further medication management and for therapists for therapy.  ? ? Walk-Ins for medication management  are available on Monday, Wednesday, Thursday and Friday from 8am-11am.  It is first come, first -serve; it is best to arrive by 7:00 AM.  ? ? Walk-Ins for therapy are  available on Monday and Wednesday?s  8am-11am.  It is first come, first -serve; it is best to arrive by 7:00 AM.  ? ? ?When you arrive please go upstairs for your appointment. If you are unsure of where to go, inform the front desk that you are here for a walk in appointment and they will assist you with directions upstairs. ? ?Address:  ?[address], in [place], [address] ?Ph: [phone]  ? ? ? ? ? ?

## 2022-03-10 NOTE — Progress Notes (Signed)
Pt is admitted to continuous assessment due to SI with plan to walk into traffic. Pt verbally contracts for safety on the unit. Pt is alert and oriented. Pt is ambulatory and is oriented to staff/unit. Pt was cooperative with labs and skin assessment. Pt has an ankle monitor. Pt endorses AVH. Pt denies current HI. Staff will monitor for pt's safety. ?

## 2022-03-10 NOTE — ED Notes (Signed)
Pt sleeping at present, no distress noted, respirations even & unlabored.  Monitoring for safety. ?

## 2022-03-10 NOTE — ED Notes (Signed)
Pt refused vital signs.

## 2022-03-10 NOTE — Telephone Encounter (Signed)
Call from [place] with [facility] wanting someoone to be aware that patient had spoken with him about AVH and SI today. States he had already spoken with staff in [facility] and knew patient had been discharged. I told caller I would document his information he just shared but I had no involvement with this person so was unable to offer additional help.  ?

## 2022-03-10 NOTE — ED Notes (Signed)
Patient discharge with follow up instructions and community resources. Patient given a bus pass. Denies SI, HI at time of discharge pain 0/10. ?

## 2022-03-10 NOTE — ED Notes (Signed)
Pt sleeping at present, no distress noted.  Monitoring for safety. 

## 2022-03-11 NOTE — ED Notes (Signed)
Pt laying in bed quiet.  No distress noted at this time.  Care assumed.  ?

## 2022-03-11 NOTE — ED Notes (Signed)
Pt used the phone. Then stated he needed to be discharged so " I can get to work" , " I don't want to loose my job".  Provider's notified in the chat that pt would like to speak with them.  ?

## 2022-03-11 NOTE — Progress Notes (Signed)
CSW provided the following resource for the patient to utilize listed below. Further, CSW contacted [facility] in [place] to inquire about bed availability. It was reported by Dennie Bible that there are beds available at the facility, however since they are working off a centralized nursing system the nurse is not available today to process patient in for bed assignments, however there are still 24 hour observation beds available for the patient to wait to be the unit.     ? ?Substance Abuse Resources ? ? ?[facility] ?- Admissions are currently completed Monday through Friday at 8am; both appointments and walk-ins are accepted.  Any individual that is a [facility] resident may present for a substance abuse screening and assessment for admission.  A person may be referred by numerous sources or self-refer.   Potential clients will be screened for medical necessity and appropriateness for the program.  Clients must meet criteria for high-intensity residential treatment services.  If clinically appropriate, a client will continue with the comprehensive clinical assessment and intake process, as well as enrollment in the [facility] Network. ? ?Address: [address] ?[place], [place] [address] ?Admin Hours: Mon-Fri 8AM to 5PM ?Center Hours: 24/7 ?Phone: [phone] ?Fax: [phone] ? ?[facility] risk surveyor (Detox) Facility Based Crisis:  ?These are 3 locations for services: Please call before arrival:   ? ?[facility])  ?Address: [address] [place], [place] [address] ?Phone: [phone] ? ?[facility]) ?Address: [address] [place], [place] [address] ?Phone#: [phone] ? ?[facility]) ?Address: [address] [place], [place], [place] [address] ?Phone#: [phone] ? ? ?[facility] ([facility]): (offers outpatient therapy and intensive outpatient substance abuse therapy).  ?[address], [place], [place] [address] ?Phone: [phone] ? ?[employer] ?Men's Division ?Address: [address] [place], [place] [address] ?Phone: [phone] ? ?-The [employer] provides food, shelter and other programs and services to the homeless men of [place] through our [employer]. ? ?By offering safe shelter, three meals a day, clean clothing, Biblical counseling, financial planning, vocational training, GED/education and employment assistance, we've helped mend the shattered lives of many homeless men since opening in 1974. ? ?We have approximately 267 beds available, with a max of 312 beds including mats for emergency situations and currently house an average of 270 men a night. ? ?Prospective Client Check-In Information ?Photo ID Required (State/ Out of State/ [facility]) - if photo ID is not available, clients are required to have a printout of a police/sheriff's criminal history report. ?Help out with chores around the [place]. ?No sex offender of any type (pending, charged, registered and/or any other sex related offenses) will be permitted to check in. ?Must be willing to abide by all rules, regulations, and policies established by the [employer]. ?The following will be provided - shelter, food, clothing, and biblical counseling. ?If you or someone you know is in need of assistance at our [facility] shelter in [place], [place], please call [phone] ext. [phone]. ? ?Women Shelter for [employer] hours are Monday-Friday only.  ? ?[facility]: ? ? Phone: [phone] ? ?[facility] ?SA Intensive Outpatient Program (SAIOP) means structured individual and group addiction activities and services that are provided at an outpatient program designed to assist adult and adolescent consumers to begin recovery and learn skills for recovery maintenance. The [facility] SAIOP program  is offered at least 3 hours a day, 3 days  a week. SAIOP services shall include a structured program consisting of, but not limited to, the following services: ?Individual counseling and support; Group counseling and support; Family counseling, training or support; Biochemical assays to identify recent drug use (e.g., urine drug screens); Strategies for relapse prevention to include community and social support systems in treatment; Life skills; Crisis contingency planning; Disease Management; and Treatment support activities that have been adapted or specifically designed for persons with physical disabilities, or persons with co-occurring disorders of mental illness and substance abuse/dependence or mental retardation/developmental disability and substance abuse/dependence. ? ?Phone: [phone]  ? ?[facility]) ? ?Address: [address] [place], [place], [place] [address] ?Phone: [phone] ? ? ?[facility] ?Address: [address] [place], [place] [address] ?Phone: [phone] ? ?- a combination of group and individual sessions to meet the participants needs. This allows participants to engage in treatment and remain involved in their home and work life. ?- Transitional housing places program participants in a supportive living environment while they complete a treatment program and work to secure independent housing. ?- The Substance Abuse Intensive Outpatient Treatment Program at [facility] consists of structured group sessions and individual sessions that are designed to teach participants early recovery and relapse prevention skills. ?-[facility] works with the [employer] to provide a housing and treatment program for homeless veterans.  ? ?[facility]  ? ?Address: [address] [place], [place] [address] ?Phone#: [phone]  ? ?: Referrals to RTSA facilities can be made by [employer] and [facility].  Referrals are also accepted from  physicians, private providers, hospital emergency rooms, family members, or any person who has knowledge of someone in the need of our services. ? ?The [facility] will also offer the following outpatient services: (Monday through Friday 8am-5pm) ?  ?Partial Hospitalization Program (PHP) ?Substance Abuse Intensive Outpatient Program (SA-IOP) ?Group Therapy ?Medication Management ?Peer Living Room ?We also provide (24/7):  ?Assessments: Our mental health clinician and providers will conduct a focused mental health evaluation, assessing for immediate safety concerns and further mental health needs. ?Referral: Our team will provide resources and help connect to community based mental health treatment, when indicated, including psychotherapy, psychiatry, and other specialized behavioral health or substance use disorder services (for those not already in treatment). ?Transitional Care: Our team providers in person bridging and/or telephonic follow-up during the patient's transition to outpatient services.  ? ?The [facility] ?24-Hour Call Center: ?[phone] ?Behavioral Health Crisis Line: ?[phone] ? ?Crissie Reese, MSW, LCSW-A, LCAS-A ?Phone: [phone] ?Disposition/TOC ? ? ?

## 2022-03-11 NOTE — Discharge Instructions (Addendum)
Substance Abuse Resources ? ? ?[facility] ?- Admissions are currently completed Monday through Friday at 8am; both appointments and walk-ins are accepted.  Any individual that is a [place] resident may present for a substance abuse screening and assessment for admission.  A person may be referred by numerous sources or self-refer.   Potential clients will be screened for medical necessity and appropriateness for the program.  Clients must meet criteria for high-intensity residential treatment services.  If clinically appropriate, a client will continue with the comprehensive clinical assessment and intake process, as well as enrollment in the [facility]. ? ?Address: [address] ?[place], [place] [address] ?Admin Hours: Mon-Fri 8AM to 5PM ?Center Hours: 24/7 ?Phone: [phone] ?Fax: [phone] ? ?[facility] (Detox) Facility Based Crisis:  ?These are 3 locations for services: Please call before arrival:   ? ?[facility] ([facility])  ?Address: [address] [place], [place] [address] ?Phone: [phone] ? ?[facility] ([facility]) ?Address: [address], [place], [place] [address] ?Phone#: [phone] ? ?[facility] ([facility]) ?Address: [address], [place], [place] [address] ?Phone#: [phone] ? ? ?[facility] ([facility]): (offers outpatient therapy and intensive outpatient substance abuse therapy).  ?[address], [place], [place] [address] ?Phone: [phone] ? ?[facility]Men's Division ?Address: [address] [place], [place] [address] ?Phone: [phone] ? ?-The [facility] provides food, shelter and other programs and services to the homeless men of [place] through our men's program. ? ?By offering safe shelter, three meals a day, clean clothing, Biblical counseling, financial planning, vocational training, GED/education and  employment assistance, we've helped mend the shattered lives of many homeless men since opening in 1974. ? ?We have approximately 267 beds available, with a max of 312 beds including mats for emergency situations and currently house an average of 270 men a night. ? ?Prospective Client Check-In Information ?Photo ID Required (State/ Out of State/ DOC) - if photo ID is not available, clients are required to have a printout of a police/sheriff's criminal history report. ?Help out with chores around the [place]. ?No sex offender of any type (pending, charged, registered and/or any other sex related offenses) will be permitted to check in. ?Must be willing to abide by all rules, regulations, and policies established by the [facility]. ?The following will be provided - shelter, food, clothing, and biblical counseling. ?If you or someone you know is in need of assistance at our men's shelter in [place], , please call [phone] ext. [phone]. ? ?[facility] for [facility] intake hours are Monday-Friday only.  ? ?[facility]: ? ? Phone: [phone] ? ?[facility] ?SA Intensive Outpatient Program (SAIOP) means structured individual and group addiction activities and services that are provided at an outpatient program designed to assist adult and adolescent consumers to begin recovery and learn skills for recovery maintenance. The [facility] SAIOP program is offered at least 3 hours a day, 3 days a week. SAIOP services shall include a structured program consisting of, but not limited to, the following services: ?Individual counseling and support; Group counseling and support; Family counseling, training or support; Biochemical assays to identify recent drug use (e.g., urine drug screens); Strategies for relapse prevention to include community and social support systems in treatment; Life skills; Crisis contingency planning; Disease Management; and Treatment  support activities that have been adapted or   specifically designed for persons with physical disabilities, or persons with co-occurring disorders of mental illness and substance abuse/dependence or mental retardation/developmental disability and substance abuse/dependence. ? ?Phone: [phone]  ? ?[facility]) ? ?Address: [address], [place], [place] [address] ?Phone: [phone] ? ? ?[facility] ?Address: [address], [place], [place] [address] ?Phone: [phone] ? ?- a combination of group and individual sessions to meet the participants needs. This allows participants to engage in treatment and remain involved in their home and work life. ?- Transitional housing places program participants in a supportive living environment while they complete a treatment program and work to secure independent housing. ?- The Substance Abuse Intensive Outpatient Treatment Program at [facility] consists of structured group sessions and individual sessions that are designed to teach participants early recovery and relapse prevention skills. ?-[facility] works with the [employer] to provide a housing and treatment program for homeless veterans.  ? ?[facility]  ? ?Address: [address] [place], [place] [address] ?Phone#: [phone]  ? ?: Referrals to RTSA facilities can be made by [facility] and [facility].  Referrals are also accepted from physicians, private providers, hospital emergency rooms, family members, or any person who has knowledge of someone in the need of our services. ? ?The [facility] [place] [facility] will also offer the following outpatient services: (Monday through Friday 8am-5pm) ?  ?Partial Hospitalization Program (PHP) ?Substance Abuse Intensive Outpatient Program (SA-IOP) ?Group Therapy ?Medication Management ?Peer Living Room ?We also provide (24/7):  ?Assessments: Our mental health  clinician and providers will conduct a focused mental health evaluation, assessing for immediate safety concerns and further mental health needs. ?Referral: Our team will provide resources and help connect to community based mental health treatment, when indicated, including psychotherapy, psychiatry, and other specialized behavioral health or substance use disorder services (for those not already in treatment). ?Transitional Care: Our team providers in person bridging and/or telephonic follow-up during the patient's transition to outpatient services.  ? ?[facility] ?24-Hour Call Center: ?[phone] ?Behavioral Health Crisis Line: ?[phone] ? ?

## 2022-03-11 NOTE — ED Notes (Signed)
Pt given blanket and is currently resting.  ?

## 2022-03-11 NOTE — ED Notes (Signed)
Safe transport called.  Pt was informed and is currently getting dressed.  No distress noted.  Pt appears eager to go to [facility]. ?

## 2022-03-11 NOTE — ED Provider Notes (Addendum)
FBC/OBS ASAP Discharge Summary ? ?Date and Time: 03/11/2022 10:59 AM  ?Name: Dominic [name]  ?MRN:  [id number]  ? ?Discharge Diagnoses:  ?Final diagnoses:  ?None  ? ? ?Subjective: " I was told I can get into a 30-day treatment program." ? ?Dominic [name] was seen and evaluated face-to-face.  He is denying suicidal homicidal ideations.  Denies auditory visual hallucinations.  Denies that he is taking psychotropic medications for mental health.  Dates he was seeking outpatient resources/rehabilitation program for at least 30 days.  He followed up with social worker who made additional outpatient resources available.  Contacted [facility] for bed availability. ? ?Per CSW note: "CSW provided the following resource for the patient to utilize listed below. Further, CSW contacted [facility] in [place] to inquire about bed availability. It was reported by Dennie Bible that there are beds available at the facility, however since they are working off a centralized nursing system the nurse is not available today to process patient in for bed assignments, however there are still 24 hour observation beds available for the patient to wait to be the unit."  ? ? ?During evaluation Dominic [name] is sitting in no acute distress. He is alert/oriented x 4; calm/cooperative; and mood congruent with affect. He is speaking in a clear tone at moderate volume, and normal pace; with good eye contact.  His thought process is coherent and relevant; There is no indication that he is currently responding to internal/external stimuli or experiencing delusional thought content; and he has denied suicidal/self-harm/homicidal ideation, psychosis, and paranoia.   ?Patient has remained calm throughout assessment and has answered questions appropriately.   ? ? ?At this time Dominic [name] is educated and verbalizes understanding of mental health resources and other crisis services in the community. He is instructed to call 911 and present to the  nearest emergency room should he experience any suicidal/homicidal ideation, auditory/visual/hallucinations, or detrimental worsening of his mental health condition.He was a also advised by Clinical research associate that he could call the toll-free phone on insurance card to assist with identifying in network counselors and agencies or number on back of Medicaid card to speak with care coordinator. ?      ? ? ?Stay Summary:  ? ?Total Time spent with patient: 15 minutes ? ?Past Psychiatric History:  ?Past Medical History:  ?Past Medical History:  ?Diagnosis Date  ? Anxiety   ? Asthma   ? Bipolar 1 disorder (HCC)   ? Chronic hepatitis C without hepatic coma (HCC) 05/28/2018  ? Depression   ? Hepatitis C   ? Hepatitis C antibody positive in blood 05/28/2018  ? Schizophrenia (HCC)   ? Sleep apnea   ?  ?Past Surgical History:  ?Procedure Laterality Date  ? CYST REMOVAL NECK    ? neck  ? ?Family History:  ?Family History  ?Problem Relation Age of Onset  ? Hypertension Sister   ? Schizophrenia Sister   ? ?Family Psychiatric History:  ?Social History:  ?Social History  ? ?Substance and Sexual Activity  ?Alcohol Use Yes  ? Alcohol/week: 3.0 standard drinks  ? Types: 3 Shots of liquor per week  ?   ?Social History  ? ?Substance and Sexual Activity  ?Drug Use Yes  ? Types: Cocaine, Marijuana  ?  ?Social History  ? ?Socioeconomic History  ? Marital status: Single  ?  Spouse name: Not on file  ? Number of children: Not on file  ? Years of education: Not on file  ? Highest education level:  Not on file  ?Occupational History  ? Not on file  ?Tobacco Use  ? Smoking status: Some Days  ?  Packs/day: 1.00  ?  Types: Cigarettes  ? Smokeless tobacco: Never  ?Vaping Use  ? Vaping Use: Never used  ?Substance and Sexual Activity  ? Alcohol use: Yes  ?  Alcohol/week: 3.0 standard drinks  ?  Types: 3 Shots of liquor per week  ? Drug use: Yes  ?  Types: Cocaine, Marijuana  ? Sexual activity: Not Currently  ?Other Topics Concern  ? Not on file  ?Social History  Narrative  ? Not on file  ? ?Social Determinants of Health  ? ?Financial Resource Strain: Not on file  ?Food Insecurity: Not on file  ?Transportation Needs: Not on file  ?Physical Activity: Not on file  ?Stress: Not on file  ?Social Connections: Not on file  ? ?SDOH:  ?SDOH Screenings  ? ?Alcohol Screen: Low Risk   ? Last Alcohol Screening Score (AUDIT): 2  ?Depression (PHQ2-9): Medium Risk  ? PHQ-2 Score: 24  ?Financial Resource Strain: Not on file  ?Food Insecurity: Not on file  ?Housing: Not on file  ?Physical Activity: Not on file  ?Social Connections: Not on file  ?Stress: Not on file  ?Tobacco Use: High Risk  ? Smoking Tobacco Use: Some Days  ? Smokeless Tobacco Use: Never  ? Passive Exposure: Not on file  ?Transportation Needs: Not on file  ? ? ?Tobacco Cessation:  A prescription for an FDA-approved tobacco cessation medication provided at discharge ? ?Current Medications:  ?Current Facility-Administered Medications  ?Medication Dose Route Frequency Provider Last Rate Last Admin  ? acetaminophen (TYLENOL) tablet 650 mg  650 mg Oral Q6H PRN Lauro [facility], MD      ? alum & mag hydroxide-simeth (MAALOX/MYLANTA) 200-200-20 MG/5ML suspension 30 mL  30 mL Oral Q4H PRN Pashayan, Mardelle Matte, MD      ? magnesium hydroxide (MILK OF MAGNESIA) suspension 30 mL  30 mL Oral Daily PRN Lauro Franklin, MD      ? traZODone (DESYREL) tablet 50 mg  50 mg Oral QHS PRN Lauro Franklin, MD      ? ?Current Outpatient Medications  ?Medication Sig Dispense Refill  ? ARIPiprazole (ABILIFY) 10 MG tablet Take 1 tablet (10 mg total) by mouth daily. (Patient not taking: Reported on 03/10/2022) 30 tablet 0  ? citalopram (CELEXA) 20 MG tablet Take 1 tablet (20 mg total) by mouth daily. (Patient not taking: Reported on 03/10/2022) 30 tablet 0  ? traZODone (DESYREL) 50 MG tablet Take 1 tablet (50 mg total) by mouth at bedtime as needed for sleep. (Patient not taking: Reported on 03/10/2022) 30 tablet 0  ? ? ?PTA Medications:  (Not in a hospital admission) ? ? ?Musculoskeletal  ?Strength & Muscle Tone: within normal limits ?Gait & Station: normal ?Patient leans: N/A ? ?Psychiatric Specialty Exam  ?Presentation  ?General Appearance: Casual; Disheveled ? ?Eye Contact:Fair ? ?Speech:Clear and Coherent; Normal Rate ? ?Speech Volume:Normal ? ?Handedness:Right ? ? ?Mood and Affect  ?Mood:Depressed ? ?Affect:Depressed ? ? ?Thought Process  ?Thought Processes:Coherent; Linear; Goal Directed ? ?Descriptions of Associations:Intact ? ?Orientation:Full (Time, Place and Person) ? ?Thought Content:Logical ? Diagnosis of Schizophrenia or Schizoaffective disorder in past: Yes ? Duration of Psychotic Symptoms: Greater than six months ? ? Hallucinations:Hallucinations: None ? ?Ideas of Reference:None ? ?Suicidal Thoughts:Suicidal Thoughts: No ? ?Homicidal Thoughts:Homicidal Thoughts: No ? ? ?Sensorium  ?Memory:Immediate Fair; Recent Fair ? ?Judgment:Intact ? ?Insight:Present ? ? ?  Executive Functions  ?Concentration:Fair ? ?Attention Span:Fair ? ?Recall:Fair ? ?Fund of Knowledge:Fair ? ?Language:Fair ? ? ?Psychomotor Activity  ?Psychomotor Activity:Psychomotor Activity: Normal ? ? ?Assets  ?Assets:Communication Skills; Desire for Improvement; Housing; Leisure Time ? ? ?Sleep  ?Sleep:Sleep: Poor ? ? ?Nutritional Assessment (For OBS and FBC admissions only) ?Has the patient had a weight loss or gain of 10 pounds or more in the last 3 months?: No ?Has the patient had a decrease in food intake/or appetite?: Yes ?Does the patient have dental problems?: No ?Does the patient have eating habits or behaviors that may be indicators of an eating disorder including binging or inducing vomiting?: No ?Has the patient recently lost weight without trying?: 2.0 ?Has the patient been eating poorly because of a decreased appetite?: 1 ?Malnutrition Screening Tool Score: 3 ? ? ? ?Physical Exam  ?Physical Exam ?Vitals and nursing note reviewed.  ?Cardiovascular:  ?   Rate and  Rhythm: Normal rate and regular rhythm.  ?Neurological:  ?   Mental Status: He is oriented to person, place, and time.  ?Psychiatric:     ?   Attention and Perception: Attention normal.     ?   Mood and Af

## 2022-03-11 NOTE — ED Notes (Signed)
Pt given muffin and juice.  ?

## 2022-03-11 NOTE — ED Notes (Signed)
Pt now states he thinks he needs to be here and "get help for my drugs".  ?

## 2022-03-11 NOTE — ED Notes (Addendum)
Patient was discharged to [facility] in [place]. Patient was sent with discharge instructions. Patient was escorted and transported via safe transport.  ?

## 2022-03-11 NOTE — ED Notes (Signed)
Pt sleeping at present, no distress noted.  Monitoring for safety. 

## 2022-03-14 ENCOUNTER — Telehealth (HOSPITAL_COMMUNITY): Payer: Self-pay

## 2022-03-14 NOTE — BH Assessment (Signed)
Care Management - Follow Up BHUC Discharges  ? ?Patient has been placed in an inpatient at [facility] substance abuse facility in [place] on 03-11-2022. ? ?

## 2022-03-30 DIAGNOSIS — Z765 Malingerer [conscious simulation]: Secondary | ICD-10-CM

## 2022-03-30 HISTORY — DX: Malingerer (conscious simulation): Z76.5

## 2022-04-09 ENCOUNTER — Emergency Department
Admission: EM | Admit: 2022-04-09 | Discharge: 2022-04-09 | Disposition: A | Discharge status: Home / Self Care | Payer: Medicaid Other | Attending: Emergency Medicine | Admitting: Emergency Medicine

## 2022-04-09 ENCOUNTER — Other Ambulatory Visit: Payer: Self-pay

## 2022-04-09 DIAGNOSIS — R7401 Elevation of levels of liver transaminase levels: Secondary | ICD-10-CM | POA: Diagnosis not present

## 2022-04-09 DIAGNOSIS — R45851 Suicidal ideations: Secondary | ICD-10-CM | POA: Insufficient documentation

## 2022-04-09 DIAGNOSIS — F209 Schizophrenia, unspecified: Secondary | ICD-10-CM | POA: Diagnosis not present

## 2022-04-09 DIAGNOSIS — Z20822 Contact with and (suspected) exposure to covid-19: Secondary | ICD-10-CM | POA: Diagnosis not present

## 2022-04-09 DIAGNOSIS — D72829 Elevated white blood cell count, unspecified: Secondary | ICD-10-CM | POA: Insufficient documentation

## 2022-04-09 DIAGNOSIS — F1020 Alcohol dependence, uncomplicated: Secondary | ICD-10-CM | POA: Diagnosis not present

## 2022-04-09 DIAGNOSIS — F1410 Cocaine abuse, uncomplicated: Secondary | ICD-10-CM | POA: Diagnosis not present

## 2022-04-09 DIAGNOSIS — B182 Chronic viral hepatitis C: Secondary | ICD-10-CM | POA: Insufficient documentation

## 2022-04-09 DIAGNOSIS — F200 Paranoid schizophrenia: Secondary | ICD-10-CM | POA: Insufficient documentation

## 2022-04-09 DIAGNOSIS — R440 Auditory hallucinations: Secondary | ICD-10-CM | POA: Diagnosis present

## 2022-04-09 DIAGNOSIS — Y900 Blood alcohol level of less than 20 mg/100 ml: Secondary | ICD-10-CM | POA: Insufficient documentation

## 2022-04-09 DIAGNOSIS — F251 Schizoaffective disorder, depressive type: Secondary | ICD-10-CM | POA: Diagnosis present

## 2022-04-09 DIAGNOSIS — F1910 Other psychoactive substance abuse, uncomplicated: Secondary | ICD-10-CM | POA: Diagnosis not present

## 2022-04-09 DIAGNOSIS — F203 Undifferentiated schizophrenia: Secondary | ICD-10-CM | POA: Diagnosis present

## 2022-04-09 DIAGNOSIS — Z043 Encounter for examination and observation following other accident: Secondary | ICD-10-CM | POA: Diagnosis present

## 2022-04-09 LAB — URINE DRUG SCREEN, QUALITATIVE (ARMC ONLY)
Amphetamines, Ur Screen: NOT DETECTED
Barbiturates, Ur Screen: NOT DETECTED
Benzodiazepine, Ur Scrn: POSITIVE — AB
Cannabinoid 50 Ng, Ur ~~LOC~~: POSITIVE — AB
Cocaine Metabolite,Ur ~~LOC~~: POSITIVE — AB
MDMA (Ecstasy)Ur Screen: NOT DETECTED
Methadone Scn, Ur: NOT DETECTED
Opiate, Ur Screen: NOT DETECTED
Phencyclidine (PCP) Ur S: NOT DETECTED
Tricyclic, Ur Screen: NOT DETECTED

## 2022-04-09 LAB — COMPREHENSIVE METABOLIC PANEL
ALT: 67 U/L — ABNORMAL HIGH (ref 0–44)
AST: 84 U/L — ABNORMAL HIGH (ref 15–41)
Albumin: 4.3 g/dL (ref 3.5–5.0)
Alkaline Phosphatase: 42 U/L (ref 38–126)
Anion gap: 8 (ref 5–15)
BUN: 19 mg/dL (ref 6–20)
CO2: 24 mmol/L (ref 22–32)
Calcium: 9.2 mg/dL (ref 8.9–10.3)
Chloride: 108 mmol/L (ref 98–111)
Creatinine, Ser: 0.84 mg/dL (ref 0.61–1.24)
GFR, Estimated: >60 mL/min (ref >60)
Glucose, Bld: 87 mg/dL (ref 70–99)
Potassium: 4.2 mmol/L (ref 3.5–5.1)
Sodium: 140 mmol/L (ref 135–145)
Total Bilirubin: 1.5 mg/dL — ABNORMAL HIGH (ref 0.3–1.2)
Total Protein: 8.2 g/dL — ABNORMAL HIGH (ref 6.5–8.1)

## 2022-04-09 LAB — ACETAMINOPHEN LEVEL: Acetaminophen (Tylenol), Serum: <10 ug/mL — ABNORMAL LOW (ref 10–30)

## 2022-04-09 LAB — CBC
HCT: 39.4 % (ref 39.0–52.0)
Hemoglobin: 13.8 g/dL (ref 13.0–17.0)
MCH: 28.4 pg (ref 26.0–34.0)
MCHC: 35 g/dL (ref 30.0–36.0)
MCV: 81.1 fL (ref 80.0–100.0)
Platelets: 210 10*3/uL (ref 150–400)
RBC: 4.86 MIL/uL (ref 4.22–5.81)
RDW: 14.4 % (ref 11.5–15.5)
WBC: 10.8 10*3/uL — ABNORMAL HIGH (ref 4.0–10.5)
nRBC: 0 % (ref 0.0–0.2)

## 2022-04-09 LAB — ETHANOL: Alcohol, Ethyl (B): <10 mg/dL (ref <10)

## 2022-04-09 LAB — SALICYLATE LEVEL: Salicylate Lvl: <7 mg/dL — ABNORMAL LOW (ref 7.0–30.0)

## 2022-04-09 MED ORDER — LORAZEPAM 1 MG PO TABS
1.0000 mg | ORAL_TABLET | Freq: Once | ORAL | Status: AC
  Administered 2022-04-09: 1 mg via ORAL
  Filled 2022-04-09: qty 1

## 2022-04-09 MED ORDER — OLANZAPINE 5 MG PO TBDP
10.0000 mg | ORAL_TABLET | Freq: Once | ORAL | Status: AC
  Administered 2022-04-09: 10 mg via ORAL
  Filled 2022-04-09: qty 2

## 2022-04-09 NOTE — ED Notes (Signed)
Pt refuses vitals. States "wheres my fucking shot, I didn't talk to no damn psychitrist". This RN informed patient that Psych NP and TTS were at bedside this AM. Pt states "fuck no they werent". Pt argumentative and aggressive with staff.

## 2022-04-09 NOTE — Consult Note (Signed)
[facility] Face-to-Face Psychiatry Consult   Reason for Consult: Substance Use  Referring Physician: Dr. Katrinka Blazing Patient Identification: Dominic [name] MRN:  [id number] Principal Diagnosis: <principal problem not specified> Diagnosis:  Active Problems:   Cocaine abuse (HCC)   Chronic hepatitis C without hepatic coma (HCC)   Schizophrenia, chronic condition (HCC)   Schizophrenia, undifferentiated (HCC)   Auditory hallucination   Schizoaffective disorder, depressive type (HCC)   Paranoid schizophrenia (HCC)   Total Time spent with patient: 1 hour  Subjective: "I hear voices when I use." Dominic [name] is a 46 y.o. male patient who presented to [facility] ED under involuntary commitment status (IVC). The patient's UDS is positive for Cocaine, Cannabinoids, and Benzodiazepine; the patient shared that whenever he uses substances, he tends to hallucinate. Per the IVC paperwork, the patient was rambling, stating, "Shadow man is going to kill me."    This provider saw The patient face-to-face; the chart was reviewed, and consulted with Dr. Vicente Males on 04/09/2022 due to the patient's care. It was discussed with the EDP that the patient does not meet the criteria to be admitted to the psychiatric inpatient unit.  On evaluation, the patient is alert and oriented x3, calm, and cooperative, but somnolence and mood congruent with affect. The patient does not appear to be responding to internal or external stimuli. Neither is the patient presenting with any delusional thinking. The patient denies auditory or visual hallucinations. The patient denies any suicidal, homicidal, or self-harm ideations. The patient is not presenting with any psychotic or paranoid behaviors. During an encounter with the patient, he could answer questions appropriately.  HPI: Per Dr. Armida Sans Dominic [name] is a 46 y.o. male who presents to the ED for evaluation of No chief complaint on file. I review [facility] psychiatric discharge summary  from 5/22.  He is admitted for 4 days due to substance abuse suicidality and detox in the setting of schizoaffective disorder and cocaine abuse. Presents to the ED today under IVC with police for evaluation of suicidal intent. He was near a [employer] track and reports that he wants to lay in front of the tracks and be run over by a train to kill himself.  Reports he has not had his medicine in about 1 month.   Past Psychiatric History:  Anxiety Bipolar 1 disorder (HCC) Depression Schizophrenia (HCC) Sleep apnea   Risk to Self:   Risk to Others:   Prior Inpatient Therapy:   Prior Outpatient Therapy:    Past Medical History:  Past Medical History:  Diagnosis Date   Anxiety    Asthma    Bipolar 1 disorder (HCC)    Chronic hepatitis C without hepatic coma (HCC) 05/28/2018   Depression    Hepatitis C    Hepatitis C antibody positive in blood 05/28/2018   Schizophrenia (HCC)    Sleep apnea     Past Surgical History:  Procedure Laterality Date   CYST REMOVAL NECK     neck   Family History:  Family History  Problem Relation Age of Onset   Hypertension Sister    Schizophrenia Sister    Family Psychiatric  History:  Social History:  Social History   Substance and Sexual Activity  Alcohol Use Yes   Alcohol/week: 3.0 standard drinks   Types: 3 Shots of liquor per week     Social History   Substance and Sexual Activity  Drug Use Yes   Types: Cocaine, Marijuana    Social History   Socioeconomic  History   Marital status: Single    Spouse name: Not on file   Number of children: Not on file   Years of education: Not on file   Highest education level: Not on file  Occupational History   Not on file  Tobacco Use   Smoking status: Some Days    Packs/day: 1.00    Types: Cigarettes   Smokeless tobacco: Never  Vaping Use   Vaping Use: Never used  Substance and Sexual Activity   Alcohol use: Yes    Alcohol/week: 3.0 standard drinks    Types: 3 Shots of liquor  per week   Drug use: Yes    Types: Cocaine, Marijuana   Sexual activity: Not Currently  Other Topics Concern   Not on file  Social History Narrative   Not on file   Social Determinants of Health   Financial Resource Strain: Not on file  Food Insecurity: Not on file  Transportation Needs: Not on file  Physical Activity: Not on file  Stress: Not on file  Social Connections: Not on file   Additional Social History:    Allergies:  No Known Allergies  Labs:  Results for orders placed or performed during the hospital encounter of 04/09/22 (from the past 48 hour(s))  Comprehensive metabolic panel     Status: Abnormal   Collection Time: 04/09/22  5:04 AM  Result Value Ref Range   Sodium 140 135 - 145 mmol/L   Potassium 4.2 3.5 - 5.1 mmol/L   Chloride 108 98 - 111 mmol/L   CO2 24 22 - 32 mmol/L   Glucose, Bld 87 70 - 99 mg/dL    Comment: Glucose reference range applies only to samples taken after fasting for at least 8 hours.   BUN 19 6 - 20 mg/dL   Creatinine, Ser 6.19 0.61 - 1.24 mg/dL   Calcium 9.2 8.9 - 50.9 mg/dL   Total Protein 8.2 (H) 6.5 - 8.1 g/dL   Albumin 4.3 3.5 - 5.0 g/dL   AST 84 (H) 15 - 41 U/L   ALT 67 (H) 0 - 44 U/L   Alkaline Phosphatase 42 38 - 126 U/L   Total Bilirubin 1.5 (H) 0.3 - 1.2 mg/dL   GFR, Estimated >32 >67 mL/min    Comment: (NOTE) Calculated using the CKD-EPI Creatinine Equation (2021)    Anion gap 8 5 - 15    Comment: Performed at [facility], [address] [place], [place] [address]  Ethanol     Status: None   Collection Time: 04/09/22  5:04 AM  Result Value Ref Range   Alcohol, Ethyl (B) <10 <10 mg/dL    Comment: (NOTE) Lowest detectable limit for serum alcohol is 10 mg/dL.  For medical purposes only. Performed at [facility], [address] [place], [place] [address]   Salicylate level     Status: Abnormal   Collection Time: 04/09/22  5:04 AM  Result Value Ref Range   Salicylate Lvl <7.0 (L) 7.0 - 30.0  mg/dL    Comment: Performed at [facility], [address] [place], [place] [address]  Acetaminophen level     Status: Abnormal   Collection Time: 04/09/22  5:04 AM  Result Value Ref Range   Acetaminophen (Tylenol), Serum <10 (L) 10 - 30 ug/mL    Comment: (NOTE) Therapeutic concentrations vary significantly. A range of 10-30 ug/mL  may be an effective concentration for many patients. However, some  are best treated at concentrations outside  of this range. Acetaminophen concentrations >150 ug/mL at 4 hours after ingestion  and >50 ug/mL at 12 hours after ingestion are often associated with  toxic reactions.  Performed at [facility], [address] [place], [place] [address]   cbc     Status: Abnormal   Collection Time: 04/09/22  5:04 AM  Result Value Ref Range   WBC 10.8 (H) 4.0 - 10.5 K/uL   RBC 4.86 4.22 - 5.81 MIL/uL   Hemoglobin 13.8 13.0 - 17.0 g/dL   HCT 09.3 23.5 - 57.3 %   MCV 81.1 80.0 - 100.0 fL   MCH 28.4 26.0 - 34.0 pg   MCHC 35.0 30.0 - 36.0 g/dL   RDW 22.0 25.4 - 27.0 %   Platelets 210 150 - 400 K/uL   nRBC 0.0 0.0 - 0.2 %    Comment: Performed at [facility], [address] [place], [place] [address]  Urine Drug Screen, Qualitative     Status: Abnormal   Collection Time: 04/09/22  5:13 AM  Result Value Ref Range   Tricyclic, Ur Screen NONE DETECTED NONE DETECTED   Amphetamines, Ur Screen NONE DETECTED NONE DETECTED   MDMA (Ecstasy)Ur Screen NONE DETECTED NONE DETECTED   Cocaine Metabolite,Ur [place] POSITIVE (A) NONE DETECTED   Opiate, Ur Screen NONE DETECTED NONE DETECTED   Phencyclidine (PCP) Ur S NONE DETECTED NONE DETECTED   Cannabinoid 50 Ng, Ur [place] POSITIVE (A) NONE DETECTED   Barbiturates, Ur Screen NONE DETECTED NONE DETECTED   Benzodiazepine, Ur Scrn POSITIVE (A) NONE DETECTED   Methadone Scn, Ur NONE DETECTED NONE DETECTED    Comment: (NOTE) Tricyclics + metabolites, urine    Cutoff 1000 ng/mL Amphetamines +  metabolites, urine  Cutoff 1000 ng/mL MDMA (Ecstasy), urine              Cutoff 500 ng/mL Cocaine Metabolite, urine          Cutoff 300 ng/mL Opiate + metabolites, urine        Cutoff 300 ng/mL Phencyclidine (PCP), urine         Cutoff 25 ng/mL Cannabinoid, urine                 Cutoff 50 ng/mL Barbiturates + metabolites, urine  Cutoff 200 ng/mL Benzodiazepine, urine              Cutoff 200 ng/mL Methadone, urine                   Cutoff 300 ng/mL  The urine drug screen provides only a preliminary, unconfirmed analytical test result and should not be used for non-medical purposes. Clinical consideration and professional judgment should be applied to any positive drug screen result due to possible interfering substances. A more specific alternate chemical method must be used in order to obtain a confirmed analytical result. Gas chromatography / mass spectrometry (GC/MS) is the preferred confirm atory method. Performed at [facility], [address] [place], [place] [address]     No current facility-administered medications for this encounter.   Current Outpatient Medications  Medication Sig Dispense Refill   ARIPiprazole (ABILIFY) 10 MG tablet Take 1 tablet (10 mg total) by mouth daily. 30 tablet 0   traZODone (DESYREL) 50 MG tablet Take 1 tablet (50 mg total) by mouth at bedtime as needed for sleep. 30 tablet 0   citalopram (CELEXA) 20 MG tablet Take 1 tablet (20 mg total) by mouth daily. (Patient not taking: Reported on 03/10/2022)  30 tablet 0    Musculoskeletal: Strength & Muscle Tone: within normal limits Gait & Station: normal Patient leans: N/A  Psychiatric Specialty Exam:  Presentation  General Appearance: Fairly Groomed  Eye Contact:Minimal  Speech:Garbled  Speech Volume:Decreased  Handedness:Right   Mood and Affect  Mood:Depressed  Affect:Depressed   Thought Process  Thought Processes:Coherent  Descriptions of  Associations:Intact  Orientation:Full (Time, Place and Person)  Thought Content:Logical  History of Schizophrenia/Schizoaffective disorder:Yes  Duration of Psychotic Symptoms:Greater than six months  Hallucinations:Hallucinations: Auditory Description of Auditory Hallucinations: "The voices are telling me to hurt people."  Ideas of Reference:None  Suicidal Thoughts:Suicidal Thoughts: No  Homicidal Thoughts:Homicidal Thoughts: No   Sensorium  Memory:Immediate Fair; Recent Fair; Remote Fair  Judgment:Intact  Insight:Present   Executive Functions  Concentration:Fair  Attention Span:Fair  Recall:Fair  Fund of Knowledge:Fair  Language:Fair   Psychomotor Activity  Psychomotor Activity:Psychomotor Activity: Normal   Assets  Assets:Communication Skills; Desire for Improvement; Housing; Resilience; Social Support   Sleep  Sleep:Sleep: Good Number of Hours of Sleep: 8   Physical Exam: Physical Exam Vitals and nursing note reviewed.  Constitutional:      Appearance: Normal appearance. He is normal weight.  HENT:     Head: Normocephalic and atraumatic.     Right Ear: External ear normal.     Left Ear: External ear normal.     Nose: Nose normal.  Cardiovascular:     Rate and Rhythm: Normal rate.     Pulses: Normal pulses.  Pulmonary:     Effort: Pulmonary effort is normal.  Musculoskeletal:        General: Normal range of motion.     Cervical back: Normal range of motion and neck supple.  Neurological:     General: No focal deficit present.     Mental Status: He is alert and oriented to person, place, and time.  Psychiatric:        Attention and Perception: Attention and perception normal.        Mood and Affect: Mood is depressed. Affect is flat.        Speech: Speech is delayed.        Behavior: Behavior normal. Behavior is cooperative.        Thought Content: Thought content normal.        Cognition and Memory: Cognition and memory normal.         Judgment: Judgment normal.   Review of Systems  Psychiatric/Behavioral:  Positive for depression and substance abuse.   All other systems reviewed and are negative. Blood pressure 131/89, pulse 90, temperature 98.9 F (37.2 C), temperature source Oral, resp. rate 12, height 5\' 11"  (1.803 m), weight 79.4 kg, SpO2 95 %. Body mass index is 24.41 kg/m.  Treatment Plan Summary: Plan -Patient does not meet criteria for psychiatric inpatient admission  Disposition: No evidence of imminent risk to self or others at present.   Patient does not meet criteria for psychiatric inpatient admission. Supportive therapy provided about ongoing stressors. Discussed crisis plan, support from social network, calling 911, coming to the Emergency Department, and calling Suicide Hotline.  Gillermo MurdochJacqueline Yannis Gumbs, NP 04/09/2022 12:01 PM

## 2022-04-09 NOTE — ED Provider Notes (Signed)
Emergency department handoff note  Care of this patient was signed out to me at the end of the previous provider shift.  All pertinent patient information was conveyed and all questions were answered.  Patient pending psychiatric evaluation who rescinded the IVC and MD patient stable for discharge at this time with outpatient follow-up.  The patient has been reexamined and is ready to be discharged.  All diagnostic results have been reviewed and discussed with the patient/family.  Care plan has been outlined and the patient/family understands all current diagnoses, results, and treatment plans.  There are no new complaints, changes, or physical findings at this time.  All questions have been addressed and answered.  Patient was instructed to, and agrees to follow-up with their primary care physician as well as return to the emergency department if any new or worsening symptoms develop.   Merwyn Katos, MD 04/09/22 1135

## 2022-04-09 NOTE — ED Triage Notes (Signed)
Pt arrives with bpd officer with ivc papers. Per papers pt stated he [place] railroad tracks to [place]. Pt is rambling, states the "shadow man is going to kill me". Pt loud, argumentative in triage.

## 2022-04-09 NOTE — ED Provider Notes (Signed)
[facility] Provider Note    Event Date/Time   First MD Initiated Contact with Patient 04/09/22 [phone]     (approximate)   History   No chief complaint on file.   HPI  Dominic [name] is a 46 y.o. male who presents to the ED for evaluation of No chief complaint on file.   I review [facility] psychiatric discharge summary from 5/22.  He is admitted for 4 days due to substance abuse suicidality and detox in the setting of schizoaffective disorder and cocaine abuse.  Presents to the ED today under IVC with police for evaluation of suicidal intent.  He was [place] a [employer] track and reports that he wants to lay in front of the tracks and be run over by a train to kill himself.  Reports he has not had his medicine in about 1 month.   Physical Exam   Triage Vital Signs: ED Triage Vitals  Enc Vitals Group     BP 04/09/22 0455 131/89     Pulse Rate 04/09/22 0455 90     Resp 04/09/22 0455 12     Temp 04/09/22 0455 98.9 F (37.2 C)     Temp Source 04/09/22 0455 Oral     SpO2 04/09/22 0455 95 %     Weight 04/09/22 0500 175 lb (79.4 kg)     Height 04/09/22 0500 5\' 11"  (1.803 m)     Head Circumference --      Peak Flow --      Pain Score 04/09/22 0500 0     Pain Loc --      Pain Edu? --      Excl. in GC? --     Most recent vital signs: Vitals:   04/09/22 0455  BP: 131/89  Pulse: 90  Resp: 12  Temp: 98.9 F (37.2 C)  SpO2: 95%    General: Awake, no distress.  Tangential, erratic but pleasant with me. CV:  Good peripheral perfusion.  Resp:  Normal effort.  Abd:  No distention.  MSK:  No deformity noted.  No signs of trauma Neuro:  No focal deficits appreciated. Other:     ED Results / Procedures / Treatments   Labs (all labs ordered are listed, but only abnormal results are displayed) Labs Reviewed  COMPREHENSIVE METABOLIC PANEL - Abnormal; Notable for the following components:      Result Value   Total Protein 8.2 (*)    AST 84 (*)     ALT 67 (*)    Total Bilirubin 1.5 (*)    All other components within normal limits  SALICYLATE LEVEL - Abnormal; Notable for the following components:   Salicylate Lvl <7.0 (*)    All other components within normal limits  ACETAMINOPHEN LEVEL - Abnormal; Notable for the following components:   Acetaminophen (Tylenol), Serum <10 (*)    All other components within normal limits  CBC - Abnormal; Notable for the following components:   WBC 10.8 (*)    All other components within normal limits  URINE DRUG SCREEN, QUALITATIVE ([facility] ONLY) - Abnormal; Notable for the following components:   Cocaine Metabolite,Ur [place] POSITIVE (*)    Cannabinoid 50 Ng, Ur [place] POSITIVE (*)    Benzodiazepine, Ur Scrn POSITIVE (*)    All other components within normal limits  RESP PANEL BY RT-PCR (FLU A&B, COVID) ARPGX2  ETHANOL    EKG   RADIOLOGY   Official radiology report(s): No results found.  PROCEDURES and INTERVENTIONS:  .Critical Care Performed by: Delton Prairie, MD Authorized by: Delton Prairie, MD   Critical care provider statement:    Critical care time (minutes):  30   Critical care time was exclusive of:  Separately billable procedures and treating other patients   Critical care was time spent personally by me on the following activities:  Development of treatment plan with patient or surrogate, discussions with consultants, evaluation of patient's response to treatment, examination of patient, ordering and review of laboratory studies, ordering and review of radiographic studies, ordering and performing treatments and interventions, pulse oximetry, re-evaluation of patient's condition and review of old charts  Medications  OLANZapine zydis (ZYPREXA) disintegrating tablet 10 mg (10 mg Oral Given 04/09/22 0540)  LORazepam (ATIVAN) tablet 1 mg (1 mg Oral Given 04/09/22 0539)     IMPRESSION / MDM / ASSESSMENT AND PLAN / ED COURSE  I reviewed the triage vital signs and the nursing  notes.  Differential diagnosis includes, but is not limited to, drug ingestion, sympathomimetic toxidrome, noncompliance with antipsychotic medications, trauma  {Patient presents with symptoms of an acute illness or injury that is potentially life-threatening.  Patient presents to the ED with acute suicidality with a plan requiring IVC and psychiatric evaluation.  He is erratic and tangential, but redirectable and willing to take oral medications.  I see no evidence of trauma or particular toxidromes though he has a long history of polysubstance abuse.  May be precipitated by sympathomimetic ingestion.  His blood work is fairly reassuring with marginal leukocytosis, marginal elevation of LFTs but otherwise normal metabolic panel.  UDS is noted to have multiple positive tests.  We will hold under IVC and consult psychiatry.  Clinical Course as of 04/09/22 0554  Wynelle Link Apr 09, 2022  0529 The patient has been placed in psychiatric observation due to the need to provide a safe environment for the patient while obtaining psychiatric consultation and evaluation, as well as ongoing medical and medication management to treat the patient's condition.  The patient has been placed under full IVC at this time.   [DS]    Clinical Course User Index [DS] Delton Prairie, MD     FINAL CLINICAL IMPRESSION(S) / ED DIAGNOSES   Final diagnoses:  Suicidal intent     Rx / DC Orders   ED Discharge Orders     None        Note:  This document was prepared using Dragon voice recognition software and may include unintentional dictation errors.   Delton Prairie, MD 04/09/22 [phone]

## 2022-04-09 NOTE — ED Notes (Signed)
Pt given cab voucher by this RN, pt then responds "go fuck yourself im not getting in that cab im going to walk home". This RN offered taxi voucher again, pt states "fuck you im going to [place]". Pt escorted to lobby by security- pt throwing objects and banging head. Security and sheriff notified and addressing situation.

## 2022-04-09 NOTE — ED Notes (Signed)
Patient reports having thoughts of self-harm wanted to lay on train tracks.  Patient reports history of self harm and inpatient treatment.

## 2022-04-09 NOTE — ED Notes (Signed)
This RN called mother. Mother going to call patient's sister to see if she can come pick patient up from emergency room.

## 2022-04-09 NOTE — BH Assessment (Signed)
Comprehensive Clinical Assessment (CCA) Screening, Triage and Referral Note  04/09/2022 Dominic [name] [id number]  Dominic [name], 46 year old male who presents to [facility] ED involuntarily for treatment. Per triage note, Pt arrives with bpd officer with ivc papers. Per papers pt stated he wanted to lie on railroad tracks to die. Pt is rambling, states the "shadow man is going to kill me". Pt loud, argumentative in triage.   During TTS assessment pt presents alert and oriented x 4, restless but cooperative, and mood-congruent with affect. The pt does not appear to be responding to internal or external stimuli. Neither is the pt presenting with any delusional thinking. Pt verified the information provided to triage RN.   Pt identifies his main complaint to be that he hallucinates only when he is using illicit substances. Patient UDS was positive for cocaine, Benzos, and cannabinoids. Patient reports he lives at the shelter and is compliant with his medications. Pt denies current SI/HI/AH/VH.    Per Annice Pih, NP pt does not meet criteria for inpatient psychiatric admission.    Chief Complaint:  Chief Complaint  Patient presents with   Hallucinations    Patient reports having hallucinations due to poly substance use.    Visit Diagnosis: Cocaine abuse  Patient Reported Information How did you hear about [place]? -- Mudlogger)  What Is the Reason for Your Visit/Call Today? Patient presents to ED involuntarily reporting hallucinations due to drug use.  How Long Has This Been Causing You Problems? <Week  What Do You Feel Would Help You the Most Today? Treatment for Depression or other mood problem; Medication(s)   Have You Recently Had Any Thoughts About Hurting Yourself? Yes  Are You Planning to Commit Suicide/Harm Yourself At This time? No   Have you Recently Had Thoughts About Hurting Someone [name]? No  Are You Planning to Harm Someone at This Time? No  Explanation: No data  recorded  Have You Used Any Alcohol or Drugs in the Past 24 Hours? Yes  How Long Ago Did You Use Drugs or Alcohol? No data recorded What Did You Use and How Much? Cocaine- unknown   Do You Currently Have a Therapist/Psychiatrist? No  Name of Therapist/Psychiatrist: [facility]   Have You Been Recently Discharged From Any Office Practice or Programs? No  Explanation of Discharge From Practice/Program: Just discharged from [facility] earlier today    CCA Screening Triage Referral Assessment Type of Contact: Face-to-Face  Telemedicine Service Delivery:   Is this Initial or Reassessment? Initial Assessment  Date Telepsych consult ordered in [facility]:  02/20/22  Time Telepsych consult ordered in [facility]:  0238  Location of Assessment: [facility] ED  Provider Location: [facility] ED   Collateral Involvement: None provided   Does Patient Have a Court Appointed Legal Guardian? No data recorded Name and Contact of Legal Guardian: No data recorded If Minor and Not Living with Parent(s), Who has Custody? n/a  Is CPS involved or ever been involved? Never  Is APS involved or ever been involved? Never   Patient Determined To Be At Risk for Harm To Self or Others Based on Review of Patient Reported Information or Presenting Complaint? No  Method: No data recorded Availability of Means: No data recorded Intent: No data recorded Notification Required: No data recorded Additional Information for Danger to Others Potential: No data recorded Additional Comments for Danger to Others Potential: No data recorded Are There Guns or Other Weapons in Your Home? No data recorded Types of Guns/Weapons: No  data recorded Are These Weapons Safely Secured?                            No data recorded Who Could Verify You Are Able To Have These Secured: No data recorded Do You Have any Outstanding Charges, Pending Court Dates, Parole/Probation? No data recorded Contacted To Inform of Risk of Harm To Self  or Others: Other: Comment (NA)   Does Patient Present under Involuntary Commitment? Yes  IVC Papers Initial File Date: 04/09/22   [place] of Residence: [place]   Patient Currently Receiving the Following Services: Not Receiving Services   Determination of Need: Emergent (2 hours)   Options For Referral: ED Visit; Medication Management; Outpatient Therapy   Discharge Disposition:     Clerance Lav, Counselor, LCAS-A

## 2022-04-09 NOTE — ED Notes (Signed)
IVC prior to arrival/ Consult ordered/pending 
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
## 2022-04-10 ENCOUNTER — Ambulatory Visit (HOSPITAL_COMMUNITY)
Admission: EM | Admit: 2022-04-10 | Discharge: 2022-04-11 | Disposition: A | Discharge status: Home / Self Care | Payer: Medicaid Other | Attending: Psychiatry | Admitting: Psychiatry

## 2022-04-10 ENCOUNTER — Other Ambulatory Visit: Payer: Self-pay

## 2022-04-10 ENCOUNTER — Emergency Department
Admission: EM | Admit: 2022-04-10 | Discharge: 2022-04-10 | Disposition: A | Discharge status: Home / Self Care | Payer: Medicaid Other | Attending: Emergency Medicine | Admitting: Emergency Medicine

## 2022-04-10 ENCOUNTER — Encounter: Payer: Self-pay | Admitting: Emergency Medicine

## 2022-04-10 DIAGNOSIS — F1020 Alcohol dependence, uncomplicated: Secondary | ICD-10-CM | POA: Diagnosis not present

## 2022-04-10 DIAGNOSIS — F1910 Other psychoactive substance abuse, uncomplicated: Secondary | ICD-10-CM | POA: Insufficient documentation

## 2022-04-10 DIAGNOSIS — Z5900 Homelessness unspecified: Secondary | ICD-10-CM | POA: Insufficient documentation

## 2022-04-10 DIAGNOSIS — R45851 Suicidal ideations: Secondary | ICD-10-CM | POA: Diagnosis not present

## 2022-04-10 DIAGNOSIS — M79606 Pain in leg, unspecified: Secondary | ICD-10-CM | POA: Insufficient documentation

## 2022-04-10 DIAGNOSIS — F1010 Alcohol abuse, uncomplicated: Secondary | ICD-10-CM | POA: Insufficient documentation

## 2022-04-10 DIAGNOSIS — X58XXXA Exposure to other specified factors, initial encounter: Secondary | ICD-10-CM | POA: Diagnosis not present

## 2022-04-10 DIAGNOSIS — F209 Schizophrenia, unspecified: Secondary | ICD-10-CM | POA: Diagnosis not present

## 2022-04-10 DIAGNOSIS — Z9151 Personal history of suicidal behavior: Secondary | ICD-10-CM | POA: Insufficient documentation

## 2022-04-10 DIAGNOSIS — S4991XA Unspecified injury of right shoulder and upper arm, initial encounter: Secondary | ICD-10-CM | POA: Diagnosis present

## 2022-04-10 DIAGNOSIS — F1410 Cocaine abuse, uncomplicated: Secondary | ICD-10-CM | POA: Diagnosis present

## 2022-04-10 DIAGNOSIS — Y900 Blood alcohol level of less than 20 mg/100 ml: Secondary | ICD-10-CM | POA: Insufficient documentation

## 2022-04-10 DIAGNOSIS — S40811A Abrasion of right upper arm, initial encounter: Secondary | ICD-10-CM | POA: Diagnosis not present

## 2022-04-10 DIAGNOSIS — R450 Nervousness: Secondary | ICD-10-CM | POA: Insufficient documentation

## 2022-04-10 LAB — SALICYLATE LEVEL: Salicylate Lvl: <7 mg/dL — ABNORMAL LOW (ref 7.0–30.0)

## 2022-04-10 LAB — COMPREHENSIVE METABOLIC PANEL
ALT: 66 U/L — ABNORMAL HIGH (ref 0–44)
AST: 78 U/L — ABNORMAL HIGH (ref 15–41)
Albumin: 4.2 g/dL (ref 3.5–5.0)
Alkaline Phosphatase: 42 U/L (ref 38–126)
Anion gap: 8 (ref 5–15)
BUN: 14 mg/dL (ref 6–20)
CO2: 23 mmol/L (ref 22–32)
Calcium: 8.8 mg/dL — ABNORMAL LOW (ref 8.9–10.3)
Chloride: 106 mmol/L (ref 98–111)
Creatinine, Ser: 0.72 mg/dL (ref 0.61–1.24)
GFR, Estimated: >60 mL/min (ref >60)
Glucose, Bld: 98 mg/dL (ref 70–99)
Potassium: 3.5 mmol/L (ref 3.5–5.1)
Sodium: 137 mmol/L (ref 135–145)
Total Bilirubin: 1.6 mg/dL — ABNORMAL HIGH (ref 0.3–1.2)
Total Protein: 8.2 g/dL — ABNORMAL HIGH (ref 6.5–8.1)

## 2022-04-10 LAB — ACETAMINOPHEN LEVEL: Acetaminophen (Tylenol), Serum: <10 ug/mL — ABNORMAL LOW (ref 10–30)

## 2022-04-10 LAB — URINE DRUG SCREEN, QUALITATIVE (ARMC ONLY)
Amphetamines, Ur Screen: NOT DETECTED
Barbiturates, Ur Screen: NOT DETECTED
Benzodiazepine, Ur Scrn: POSITIVE — AB
Cannabinoid 50 Ng, Ur ~~LOC~~: POSITIVE — AB
Cocaine Metabolite,Ur ~~LOC~~: POSITIVE — AB
MDMA (Ecstasy)Ur Screen: NOT DETECTED
Methadone Scn, Ur: NOT DETECTED
Opiate, Ur Screen: NOT DETECTED
Phencyclidine (PCP) Ur S: NOT DETECTED
Tricyclic, Ur Screen: NOT DETECTED

## 2022-04-10 LAB — CBC
HCT: 39.6 % (ref 39.0–52.0)
Hemoglobin: 14.2 g/dL (ref 13.0–17.0)
MCH: 28.9 pg (ref 26.0–34.0)
MCHC: 35.9 g/dL (ref 30.0–36.0)
MCV: 80.7 fL (ref 80.0–100.0)
Platelets: 215 10*3/uL (ref 150–400)
RBC: 4.91 MIL/uL (ref 4.22–5.81)
RDW: 14.2 % (ref 11.5–15.5)
WBC: 9.9 10*3/uL (ref 4.0–10.5)
nRBC: 0 % (ref 0.0–0.2)

## 2022-04-10 LAB — RESP PANEL BY RT-PCR (FLU A&B, COVID) ARPGX2
Influenza A by PCR: NEGATIVE
Influenza B by PCR: NEGATIVE
SARS Coronavirus 2 by RT PCR: NEGATIVE

## 2022-04-10 LAB — ETHANOL: Alcohol, Ethyl (B): <10 mg/dL (ref <10)

## 2022-04-10 NOTE — ED Notes (Signed)
Pt requested shower; provided clean hospital clothing and linens.  Shower setup provided with soap, shampoo, toothbrush/toothpaste, and deodorant.  Pt able to preform own ADL's with no assistance.  Hygiene items, old clothing and linens discarded appropriately.

## 2022-04-10 NOTE — ED Notes (Signed)
Dinner meal given. 

## 2022-04-10 NOTE — ED Triage Notes (Addendum)
Pt presents with BPD with SI. Pt states he did not receive his medications last night and was discharged. Pt with superficial self inflicted laceration to posterior right arm above elbow. Pt states he has also been using drugs. Pt states he would also like to be "detoxed".

## 2022-04-10 NOTE — ED Notes (Signed)
Pt dressed out into hospital provided scrubs with this tech and [employer] in the rm. Pt belongings consist of: shoes, shirt, pants, boxers, wallet, and a trash bag full of clothes, a pair of boots, and a bunch of loose papers. Pt belongings placed into a pt belongings bag and labeled with pt name. Pt has two bags of belongings: one pt belongings bag and one trash bag. Pt calm and cooperative while dressing out.

## 2022-04-10 NOTE — ED Provider Notes (Signed)
Cleared for d/c by behavioral services   Jene Every, MD 04/10/22 2204

## 2022-04-10 NOTE — ED Notes (Signed)
Lunch given to patient.

## 2022-04-10 NOTE — ED Notes (Signed)
Pt was placed for DC after psych consult by Annice Pih, NP and Jasmine, TTS. Paperwork printed and collected by this nurse. Pt immediately angry and confrontational. Pt states that we are attempting to scam his insurance. Pt provided with phone as he said he had a number to call for a ride. Pt provided bag 1 of 1 with all belongings. Pt changes after belongings were provided. Originally refuses paperwork but does eventually accept. Pt states that no person in [facility] hospital but this nurse are polite and helpful. Pt educated on plan of care but interrupts stating he does not want to hear it. Pt refuses vitals and signature as he is in hurry to exit. Pt states he will be calling BPD as they have instructed him to call if he needs anything. Reports he still has hallucinations and does not understand his DC but does not cooperate with education that nurse can provide. Pt is walked to exit by this nurse, had forgotten paperwork in room. This was collected and returned to pt while he was in triage calling [employer] to come to the main entrance of the ED. Pt is cooperative with this nurse. Security was present for safety but were not required to intervene.

## 2022-04-10 NOTE — ED Notes (Signed)
Breakfast given.  
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
## 2022-04-10 NOTE — ED Provider Notes (Signed)
[facility] Provider Note    Event Date/Time   First MD Initiated Contact with Patient 04/10/22 0128     (approximate)   History   Psychiatric Evaluation   HPI Level 5 caveat:  history/ROS may be limited by active psychosis / mental illness / altered mental status  Iram Simona HuhDeon Orner is a 46 y.o. male well-known to the emergency department with 11 visits in the last 6 months.  He presents tonight voluntarily reporting suicidal ideation, self-harm, and polysubstance abuse wanting detox.  He was seen about 24 hours ago and at that time was violent and lacks decision-making capacity and required administration of calming agents.  He was seen by psychiatry about 12 hours ago and determined to be appropriate for outpatient treatment.  He was discharged from the emergency department in a rather irate state.  He is calm and relatively cooperative tonight but states that his depression is getting worse and he wants to die, either by lying on train tracks or by cutting himself.  He caused a superficial laceration to the back of his right upper arm which she states was a suicide attempt.  He also said that he recognizes he needs to turn his life around.  He said he does drugs and when asked which ones, he replies "all of them".  He denies pain and shortness of breath.  States he wants help.     Physical Exam   Triage Vital Signs: ED Triage Vitals  Enc Vitals Group     BP 04/10/22 0105 117/80     Pulse Rate 04/10/22 0104 83     Resp 04/10/22 0104 14     Temp 04/10/22 0104 98.9 F (37.2 C)     Temp Source 04/10/22 0104 Oral     SpO2 04/10/22 0104 95 %     Weight --      Height --      Head Circumference --      Peak Flow --      Pain Score 04/10/22 0105 0     Pain Loc --      Pain Edu? --      Excl. in GC? --     Most recent vital signs: Vitals:   04/10/22 0104 04/10/22 0105  BP:  117/80  Pulse: 83   Resp: 14   Temp: 98.9 F (37.2 C)   SpO2: 95%       General: Awake, no distress.   CV:  Good peripheral perfusion.  Resp:  Normal effort.  Abd:  No distention.  Other:  Long but very superficial scratch on the right upper arm which she says was self-inflicted.  Patient is currently calm and cooperative and not making threats.  Reports depression, polysubstance abuse, and suicidal thoughts with a plan to lay on train tracks or cut himself.   ED Results / Procedures / Treatments   Labs (all labs ordered are listed, but only abnormal results are displayed) Labs Reviewed  COMPREHENSIVE METABOLIC PANEL - Abnormal; Notable for the following components:      Result Value   Calcium 8.8 (*)    Total Protein 8.2 (*)    AST 78 (*)    ALT 66 (*)    Total Bilirubin 1.6 (*)    All other components within normal limits  SALICYLATE LEVEL - Abnormal; Notable for the following components:   Salicylate Lvl <7.0 (*)    All other components within normal limits  ACETAMINOPHEN LEVEL - Abnormal;  Notable for the following components:   Acetaminophen (Tylenol), Serum <10 (*)    All other components within normal limits  URINE DRUG SCREEN, QUALITATIVE ([facility] ONLY) - Abnormal; Notable for the following components:   Cocaine Metabolite,Ur Gem POSITIVE (*)    Cannabinoid 50 Ng, Ur Wake POSITIVE (*)    Benzodiazepine, Ur Scrn POSITIVE (*)    All other components within normal limits  SARS CORONAVIRUS 2 BY RT PCR  ETHANOL  CBC     PROCEDURES:  Critical Care performed: No  Procedures   MEDICATIONS ORDERED IN ED: Medications - No data to display   IMPRESSION / MDM / ASSESSMENT AND PLAN / ED COURSE  I reviewed the triage vital signs and the nursing notes.                              Differential diagnosis includes, but is not limited to, exacerbation or acute episode of bipolar disorder and/or schizophrenia (both of which are diagnoses carried by the patient), metabolic or electrolyte abnormality, malingering.  Patient's presentation is most  consistent with exacerbation of chronic illness.  I review of the patient's medical record in care everywhere shows that over the last few days he has been to emergency departments at [facility], [facility], again at [facility], and at [facility].  He had a brief admission to psychiatry at [facility] about 2 weeks ago.  I read the admission and discharge notes (discharge physician Dr. 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  Of note, the psychiatrist specifically notes that the patient "directly admitted to fabricating SI and CAH to obtain admission".  However, as previously mentioned, he has diagnoses which theoretically could be managed as an inpatient.  Given the high probability of malingering, I will not put the patient under involuntary commitment as I do not believe he represents an immediate danger to himself or others, and I believe he has the capacity to make his own decisions.  I will consult psychiatry for their opinion.  As per protocol, I ordered the following labs as part of the patient's medical and psychiatric evaluation:  CBC, CMP, ethanol level, acetaminophen level, salicylate level, urine drug screen, respiratory viral panel.  The patient has been placed in psychiatric observation due to the need to provide a safe environment for the patient while obtaining psychiatric consultation and evaluation, as well as ongoing medical and medication management to treat the patient's condition.  The patient has not been placed under full IVC at this time.   Clinical Course as of 04/10/22 0308  Mon Apr 10, 2022  0240 I discussed the case in person with Rashaun, who consulted for psychiatry.  She recommends reassessment in the morning to see if the daytime team feels it is appropriate to admit him for additional psychiatric care.  She denies agree that there is an element of  malingering, but he also has psychiatric diagnoses that may benefit from inpatient treatment.  We also agreed that he does not meet involuntary commitment criteria, and that he has the capacity to make his own decisions, so should he decide to leave he will be allowed to do so. [CF]  0241 Urine Drug Screen, Qualitative(!) Urine drug screen is positive for cocaine, cannabinoids, and benzodiazepines. [CF]  0241 Reviewed lab work, no significant changes from prior. [CF]    Clinical Course User Index [CF] York Cerise,  Kandee Keen, MD     FINAL CLINICAL IMPRESSION(S) / ED DIAGNOSES   Final diagnoses:  Polysubstance abuse (HCC)     Rx / DC Orders   ED Discharge Orders     None        Note:  This document was prepared using Dragon voice recognition software and may include unintentional dictation errors.   Loleta Rose, MD 04/10/22 [phone]

## 2022-04-10 NOTE — Consult Note (Signed)
[facility] Face-to-Face Psychiatry Consult   Reason for Consult:  psychiatric evaluation Referring Physician:  Dr. York Cerise Patient Identification: Dominic [name] MRN:  [id number] Principal Diagnosis: <principal problem not specified> Diagnosis:  Active Problems:   Cocaine abuse (HCC)   Schizophrenia, chronic condition (HCC)   Alcohol use disorder, severe, dependence (HCC)   Total Time spent with patient: 45 minutes  Subjective:  "I need to get off drugs"   HPI:  Dominic [name], 46 y.o., male patient seen  by this provider; chart reviewed and consulted with Dr. York Cerise on 04/10/22.  On evaluation Dominic [name] reports that he is in need of his medication.  He says he would also like to receive detox for his drug abuse.  Patient endorses use of "everything" within the last hour of arrival to this ER.  Per chart review, patient was last seen at this ER yesterday for similar presentation.  His complaint was detox and SI. He denied SI on discharge and says that he threw away his discharge planning because he "got mad".  Patient also presented 8x with one admission in the last 6 months.  A superficial cut was noted on his arm during this encounter.    During evaluation Dominic [name] is laying in bed; he is alert/oriented x 4; calm/cooperative; and mood congruent with affect.  Patient is speaking in a clear tone at moderate volume, and normal pace; with fair eye contact.  His thought process is coherent and relevant; There is no indication that he is currently responding to internal/external stimuli or experiencing delusional thought content.  Patient endorse suicidal/self-harm but is believed to be for secondary gain.  There is no indication of psychosis, and paranoia.  Patient has remained calm throughout assessment and has answered questions appropriately.   Recommendation:  Reassessment in the am.  Dominic Rankin, Dominic [name]   Past Psychiatric History: Polysubstance Abuse,  schizophrenia  Risk to Self:   Risk to Others:   Prior Inpatient Therapy:   Prior Outpatient Therapy:    Past Medical History:  Past Medical History:  Diagnosis Date   Anxiety    Asthma    Bipolar 1 disorder (HCC)    Chronic hepatitis C without hepatic coma (HCC) 05/28/2018   Depression    Hepatitis C    Hepatitis C antibody positive in blood 05/28/2018   Schizophrenia (HCC)    Sleep apnea     Past Surgical History:  Procedure Laterality Date   CYST REMOVAL NECK     neck   Family History:  Family History  Problem Relation Age of Onset   Hypertension Sister    Schizophrenia Sister    Family Psychiatric  History: unknown Social History:  Social History   Substance and Sexual Activity  Alcohol Use Yes   Alcohol/week: 3.0 standard drinks   Types: 3 Shots of liquor per week     Social History   Substance and Sexual Activity  Drug Use Yes   Types: Cocaine, Marijuana    Social History   Socioeconomic History   Marital status: Single    Spouse name: Not on file   Number of children: Not on file   Years of education: Not on file   Highest education level: Not on file  Occupational History   Not on file  Tobacco Use   Smoking status: Some Days    Packs/day: 1.00    Types: Cigarettes   Smokeless tobacco: Never  Vaping Use   Vaping Use: Never used  Substance and Sexual Activity   Alcohol use: Yes    Alcohol/week: 3.0 standard drinks    Types: 3 Shots of liquor per week   Drug use: Yes    Types: Cocaine, Marijuana   Sexual activity: Not Currently  Other Topics Concern   Not on file  Social History Narrative   Not on file   Social Determinants of Health   Financial Resource Strain: Not on file  Food Insecurity: Not on file  Transportation Needs: Not on file  Physical Activity: Not on file  Stress: Not on file  Social Connections: Not on file   Additional Social History:    Allergies:  No Known Allergies  Labs:  Results for orders placed or  performed during the hospital encounter of 04/10/22 (from the past 48 hour(s))  Comprehensive metabolic panel     Status: Abnormal   Collection Time: 04/10/22  1:10 AM  Result Value Ref Range   Sodium 137 135 - 145 mmol/L   Potassium 3.5 3.5 - 5.1 mmol/L   Chloride 106 98 - 111 mmol/L   CO2 23 22 - 32 mmol/L   Glucose, Bld 98 70 - 99 mg/dL    Comment: Glucose reference range applies only to samples taken after fasting for at least 8 hours.   BUN 14 6 - 20 mg/dL   Creatinine, Ser 1.61 0.61 - 1.24 mg/dL   Calcium 8.8 (L) 8.9 - 10.3 mg/dL   Total Protein 8.2 (H) 6.5 - 8.1 g/dL   Albumin 4.2 3.5 - 5.0 g/dL   AST 78 (H) 15 - 41 U/L   ALT 66 (H) 0 - 44 U/L   Alkaline Phosphatase 42 38 - 126 U/L   Total Bilirubin 1.6 (H) 0.3 - 1.2 mg/dL   GFR, Estimated >09 >60 mL/min    Comment: (NOTE) Calculated using the CKD-EPI Creatinine Equation (2021)    Anion gap 8 5 - 15    Comment: Performed at [facility], [address] [place], [place] [address]  Ethanol     Status: None   Collection Time: 04/10/22  1:10 AM  Result Value Ref Range   Alcohol, Ethyl (B) <10 <10 mg/dL    Comment: (NOTE) Lowest detectable limit for serum alcohol is 10 mg/dL.  For medical purposes only. Performed at [facility], [address] [place], [place] [address]   Salicylate level     Status: Abnormal   Collection Time: 04/10/22  1:10 AM  Result Value Ref Range   Salicylate Lvl <7.0 (L) 7.0 - 30.0 mg/dL    Comment: Performed at [name], [address] [place], [place] [address]  Acetaminophen level     Status: Abnormal   Collection Time: 04/10/22  1:10 AM  Result Value Ref Range   Acetaminophen (Tylenol), Serum <10 (L) 10 - 30 ug/mL    Comment: (NOTE) Therapeutic concentrations vary significantly. A range of 10-30 ug/mL  may be an effective concentration for many patients. However, some  are best treated at concentrations outside of this range. Acetaminophen  concentrations >150 ug/mL at 4 hours after ingestion  and >50 ug/mL at 12 hours after ingestion are often associated with  toxic reactions.  Performed at [facility], [address] [place], [place] [address]   cbc     Status: None   Collection Time: 04/10/22  1:10 AM  Result Value Ref Range   WBC 9.9 4.0 - 10.5 K/uL   RBC 4.91 4.22 - 5.81 MIL/uL  Hemoglobin 14.2 13.0 - 17.0 g/dL   HCT 14.4 31.5 - 40.0 %   MCV 80.7 80.0 - 100.0 fL   MCH 28.9 26.0 - 34.0 pg   MCHC 35.9 30.0 - 36.0 g/dL   RDW 86.7 61.9 - 50.9 %   Platelets 215 150 - 400 K/uL   nRBC 0.0 0.0 - 0.2 %    Comment: Performed at [facility], [address], [place], [place] [address]  Urine Drug Screen, Qualitative     Status: Abnormal   Collection Time: 04/10/22  1:17 AM  Result Value Ref Range   Tricyclic, Ur Screen NONE DETECTED NONE DETECTED   Amphetamines, Ur Screen NONE DETECTED NONE DETECTED   MDMA (Ecstasy)Ur Screen NONE DETECTED NONE DETECTED   Cocaine Metabolite,Ur [place] POSITIVE (A) NONE DETECTED   Opiate, Ur Screen NONE DETECTED NONE DETECTED   Phencyclidine (PCP) Ur S NONE DETECTED NONE DETECTED   Cannabinoid 50 Ng, Ur [place] POSITIVE (A) NONE DETECTED   Barbiturates, Ur Screen NONE DETECTED NONE DETECTED   Benzodiazepine, Ur Scrn POSITIVE (A) NONE DETECTED   Methadone Scn, Ur NONE DETECTED NONE DETECTED    Comment: (NOTE) Tricyclics + metabolites, urine    Cutoff 1000 ng/mL Amphetamines + metabolites, urine  Cutoff 1000 ng/mL MDMA (Ecstasy), urine              Cutoff 500 ng/mL Cocaine Metabolite, urine          Cutoff 300 ng/mL Opiate + metabolites, urine        Cutoff 300 ng/mL Phencyclidine (PCP), urine         Cutoff 25 ng/mL Cannabinoid, urine                 Cutoff 50 ng/mL Barbiturates + metabolites, urine  Cutoff 200 ng/mL Benzodiazepine, urine              Cutoff 200 ng/mL Methadone, urine                   Cutoff 300 ng/mL  The urine drug screen provides only a preliminary,  unconfirmed analytical test result and should not be used for non-medical purposes. Clinical consideration and professional judgment should be applied to any positive drug screen result due to possible interfering substances. A more specific alternate chemical method must be used in order to obtain a confirmed analytical result. Gas chromatography / mass spectrometry (GC/MS) is the preferred confirm atory method. Performed at [facility], [address] [place], [place] [address]     No current facility-administered medications for this encounter.   Current Outpatient Medications  Medication Sig Dispense Refill   ARIPiprazole (ABILIFY) 10 MG tablet Take 1 tablet (10 mg total) by mouth daily. 30 tablet 0   citalopram (CELEXA) 20 MG tablet Take 1 tablet (20 mg total) by mouth daily. (Patient not taking: Reported on 03/10/2022) 30 tablet 0   traZODone (DESYREL) 50 MG tablet Take 1 tablet (50 mg total) by mouth at bedtime as needed for sleep. 30 tablet 0    Musculoskeletal: Strength & Muscle Tone: within normal limits Gait & Station: normal Patient leans: N/A   Psychiatric Specialty Exam:  Presentation  General Appearance: Fairly Groomed  Eye Contact:Minimal  Speech:Garbled  Speech Volume:Decreased  Handedness:Right   Mood and Affect  Mood:Depressed  Affect:Depressed   Thought Process  Thought Processes:Coherent  Descriptions of Associations:Intact  Orientation:Full (Time, Place and Person)  Thought Content:Logical  History of Schizophrenia/Schizoaffective disorder:Yes  Duration of Psychotic Symptoms:Greater  than six months  Hallucinations:Hallucinations: Auditory Description of Auditory Hallucinations: "The voices are telling me to hurt people."  Ideas of Reference:None  Suicidal Thoughts:Suicidal Thoughts: No  Homicidal Thoughts:Homicidal Thoughts: No   Sensorium  Memory:Immediate Fair; Recent Fair; Remote  Fair  Judgment:Intact  Insight:Present   Executive Functions  Concentration:Fair  Attention Span:Fair  Recall:Fair  Fund of Knowledge:Fair  Language:Fair   Psychomotor Activity  Psychomotor Activity:Psychomotor Activity: Normal   Assets  Assets:Communication Skills; Desire for Improvement; Housing; Resilience; Social Support   Sleep  Sleep:Sleep: Good Number of Hours of Sleep: 8   Physical Exam: Physical Exam Vitals and nursing note reviewed.  HENT:     Head: Normocephalic and atraumatic.     Nose: Nose normal.     Mouth/Throat:     Mouth: Mucous membranes are dry.  Pulmonary:     Effort: Pulmonary effort is normal.  Musculoskeletal:        General: Normal range of motion.     Cervical back: Normal range of motion.  Skin:    General: Skin is dry.  Neurological:     Mental Status: He is alert and oriented to person, place, and time.  Psychiatric:        Attention and Perception: Attention and perception normal.        Mood and Affect: Mood normal.        Speech: Speech normal.        Behavior: Behavior normal. Behavior is cooperative.        Thought Content: Thought content includes suicidal ideation.        Cognition and Memory: Cognition and memory normal.        Judgment: Judgment is impulsive.   Review of Systems  Psychiatric/Behavioral:  Positive for substance abuse and suicidal ideas.   All other systems reviewed and are negative. Blood pressure 117/80, pulse 83, temperature 98.9 F (37.2 C), temperature source Oral, resp. rate 14, SpO2 95 %. There is no height or weight on file to calculate BMI.  Treatment Plan Summary: Plan Reassess in the AM  Disposition: Supportive therapy provided about ongoing stressors. Refer to IOP. Discussed crisis plan, support from social network, calling 911, coming to the Emergency Department, and calling Suicide Hotline.  Dominic Leschashaun M Khamari Yousuf, Dominic [name] 04/10/2022 3:04 AM

## 2022-04-10 NOTE — Consult Note (Signed)
[facility] Face-to-Face Psychiatry Consult   Reason for Consult: Substance Use  Referring Physician: Dr. Katrinka Blazing Patient Identification: Dominic [name] MRN:  [id number] Principal Diagnosis: <principal problem not specified> Diagnosis:  Active Problems:   Cocaine abuse (HCC)   Schizophrenia, chronic condition (HCC)   Alcohol use disorder, severe, dependence (HCC)   Total Time spent with patient: 1 hour  Subjective: "I hear voices when I use." Dominic [name] is a 46 y.o. male patient who presented to [facility] ED voluntary. The patient's UDS is positive for Cocaine, Cannabinoids, and Benzodiazepine; the patient shared that whenever he uses substances, he tends to hallucinate. Per the IVC paperwork, the patient was rambling, stating, "Shadow man is going to kill me."    This provider saw The patient face-to-face; the chart was reviewed, and consulted with Dr. Cyril Loosen on 04/10/2022 due to the patient's care. It was discussed with the EDP that the patient does not meet the criteria to be admitted to the psychiatric inpatient unit. The patient could benefit from substance use detox treatment. On evaluation, the patient is alert and oriented x3, calm, and cooperative, alert and mood congruent with affect. The patient does not appear to be responding to internal or external stimuli. Neither is the patient presenting with any delusional thinking. The patient denies auditory or visual hallucinations. The patient denies any suicidal, homicidal, or self-harm ideations. The patient is not presenting with any psychotic or paranoid behaviors. During an encounter with the patient, he could answer questions appropriately.  HPI: Per Dr. York Cerise, Level 5 caveat:  history/ROS may be limited by active psychosis / mental illness / altered mental status Dominic [name] is a 46 y.o. male well-known to the emergency department with 11 visits in the last 6 months.  He presents tonight voluntarily reporting suicidal ideation,  self-harm, and polysubstance abuse wanting detox.  He was seen about 24 hours ago and at that time was violent and lacks decision-making capacity and required administration of calming agents.  He was seen by psychiatry about 12 hours ago and determined to be appropriate for outpatient treatment.  He was discharged from the emergency department in a rather irate state. He is calm and relatively cooperative tonight but states that his depression is getting worse and he wants to die, either by lying on train tracks or by cutting himself.  He caused a superficial laceration to the back of his right upper arm which she states was a suicide attempt.  He also said that he recognizes he needs to turn his life around.  He said he does drugs and when asked which ones, he replies "all of them". He denies pain and shortness of breath.  States he wants help.  Past Psychiatric History:  Anxiety Bipolar 1 disorder (HCC) Depression Schizophrenia (HCC) Sleep apnea   Risk to Self:   Risk to Others:   Prior Inpatient Therapy:   Prior Outpatient Therapy:    Past Medical History:  Past Medical History:  Diagnosis Date   Anxiety    Asthma    Bipolar 1 disorder (HCC)    Chronic hepatitis C without hepatic coma (HCC) 05/28/2018   Depression    Hepatitis C    Hepatitis C antibody positive in blood 05/28/2018   Malingering 03/30/2022   Psychiatry note from [facility] states "directly admitted to fabricating SI and CAH to obtain [psychiatric] admission"   Schizophrenia [facility])    Sleep apnea     Past Surgical History:  Procedure Laterality Date   CYST  REMOVAL NECK     neck   Family History:  Family History  Problem Relation Age of Onset   Hypertension Sister    Schizophrenia Sister    Family Psychiatric  History:  Social History:  Social History   Substance and Sexual Activity  Alcohol Use Yes   Alcohol/week: 3.0 standard drinks   Types: 3 Shots of liquor per week     Social History   Substance  and Sexual Activity  Drug Use Yes   Types: Cocaine, Marijuana    Social History   Socioeconomic History   Marital status: Single    Spouse name: Not on file   Number of children: Not on file   Years of education: Not on file   Highest education level: Not on file  Occupational History   Not on file  Tobacco Use   Smoking status: Some Days    Packs/day: 1.00    Types: Cigarettes   Smokeless tobacco: Never  Vaping Use   Vaping Use: Never used  Substance and Sexual Activity   Alcohol use: Yes    Alcohol/week: 3.0 standard drinks    Types: 3 Shots of liquor per week   Drug use: Yes    Types: Cocaine, Marijuana   Sexual activity: Not Currently  Other Topics Concern   Not on file  Social History Narrative   Not on file   Social Determinants of Health   Financial Resource Strain: Not on file  Food Insecurity: Not on file  Transportation Needs: Not on file  Physical Activity: Not on file  Stress: Not on file  Social Connections: Not on file   Additional Social History:    Allergies:  No Known Allergies  Labs:  Results for orders placed or performed during the hospital encounter of 04/10/22 (from the past 48 hour(s))  Comprehensive metabolic panel     Status: Abnormal   Collection Time: 04/10/22  1:10 AM  Result Value Ref Range   Sodium 137 135 - 145 mmol/L   Potassium 3.5 3.5 - 5.1 mmol/L   Chloride 106 98 - 111 mmol/L   CO2 23 22 - 32 mmol/L   Glucose, Bld 98 70 - 99 mg/dL    Comment: Glucose reference range applies only to samples taken after fasting for at least 8 hours.   BUN 14 6 - 20 mg/dL   Creatinine, Ser 1.61 0.61 - 1.24 mg/dL   Calcium 8.8 (L) 8.9 - 10.3 mg/dL   Total Protein 8.2 (H) 6.5 - 8.1 g/dL   Albumin 4.2 3.5 - 5.0 g/dL   AST 78 (H) 15 - 41 U/L   ALT 66 (H) 0 - 44 U/L   Alkaline Phosphatase 42 38 - 126 U/L   Total Bilirubin 1.6 (H) 0.3 - 1.2 mg/dL   GFR, Estimated >09 >60 mL/min    Comment: (NOTE) Calculated using the CKD-EPI Creatinine  Equation (2021)    Anion gap 8 5 - 15    Comment: Performed at [facility], [address] [place], [place] [address]  Ethanol     Status: None   Collection Time: 04/10/22  1:10 AM  Result Value Ref Range   Alcohol, Ethyl (B) <10 <10 mg/dL    Comment: (NOTE) Lowest detectable limit for serum alcohol is 10 mg/dL.  For medical purposes only. Performed at [facility], [address], [place], [place] [address]   Salicylate level     Status: Abnormal   Collection Time: 04/10/22  1:10  AM  Result Value Ref Range   Salicylate Lvl <7.0 (L) 7.0 - 30.0 mg/dL    Comment: Performed at [facility], [address] [place], [place] [address]  Acetaminophen level     Status: Abnormal   Collection Time: 04/10/22  1:10 AM  Result Value Ref Range   Acetaminophen (Tylenol), Serum <10 (L) 10 - 30 ug/mL    Comment: (NOTE) Therapeutic concentrations vary significantly. A range of 10-30 ug/mL  may be an effective concentration for many patients. However, some  are best treated at concentrations outside of this range. Acetaminophen concentrations >150 ug/mL at 4 hours after ingestion  and >50 ug/mL at 12 hours after ingestion are often associated with  toxic reactions.  Performed at [facility], [address] [place], [place] [address]   cbc     Status: None   Collection Time: 04/10/22  1:10 AM  Result Value Ref Range   WBC 9.9 4.0 - 10.5 K/uL   RBC 4.91 4.22 - 5.81 MIL/uL   Hemoglobin 14.2 13.0 - 17.0 g/dL   HCT 40.339.6 47.439.0 - 25.952.0 %   MCV 80.7 80.0 - 100.0 fL   MCH 28.9 26.0 - 34.0 pg   MCHC 35.9 30.0 - 36.0 g/dL   RDW 56.314.2 87.511.5 - 64.315.5 %   Platelets 215 150 - 400 K/uL   nRBC 0.0 0.0 - 0.2 %    Comment: Performed at [facility], [address] [place], [place] [address]  Urine Drug Screen, Qualitative     Status: Abnormal   Collection Time: 04/10/22  1:17 AM  Result Value Ref Range   Tricyclic, Ur Screen NONE DETECTED NONE DETECTED    Amphetamines, Ur Screen NONE DETECTED NONE DETECTED   MDMA (Ecstasy)Ur Screen NONE DETECTED NONE DETECTED   Cocaine Metabolite,Ur [place] POSITIVE (A) NONE DETECTED   Opiate, Ur Screen NONE DETECTED NONE DETECTED   Phencyclidine (PCP) Ur S NONE DETECTED NONE DETECTED   Cannabinoid 50 Ng, Ur  POSITIVE (A) NONE DETECTED   Barbiturates, Ur Screen NONE DETECTED NONE DETECTED   Benzodiazepine, Ur Scrn POSITIVE (A) NONE DETECTED   Methadone Scn, Ur NONE DETECTED NONE DETECTED    Comment: (NOTE) Tricyclics + metabolites, urine    Cutoff 1000 ng/mL Amphetamines + metabolites, urine  Cutoff 1000 ng/mL MDMA (Ecstasy), urine              Cutoff 500 ng/mL Cocaine Metabolite, urine          Cutoff 300 ng/mL Opiate + metabolites, urine        Cutoff 300 ng/mL Phencyclidine (PCP), urine         Cutoff 25 ng/mL Cannabinoid, urine                 Cutoff 50 ng/mL Barbiturates + metabolites, urine  Cutoff 200 ng/mL Benzodiazepine, urine              Cutoff 200 ng/mL Methadone, urine                   Cutoff 300 ng/mL  The urine drug screen provides only a preliminary, unconfirmed analytical test result and should not be used for non-medical purposes. Clinical consideration and professional judgment should be applied to any positive drug screen result due to possible interfering substances. A more specific alternate chemical method must be used in order to obtain a confirmed analytical result. Gas chromatography / mass spectrometry (GC/MS) is the preferred confirm atory method. Performed at  [facility], [address] [place], [place] [address]     No current facility-administered medications for this encounter.   Current Outpatient Medications  Medication Sig Dispense Refill   ARIPiprazole (ABILIFY) 10 MG tablet Take 1 tablet (10 mg total) by mouth daily. (Patient not taking: Reported on 04/10/2022) 30 tablet 0   citalopram (CELEXA) 20 MG tablet Take 1 tablet (20 mg total) by mouth  daily. (Patient not taking: Reported on 03/10/2022) 30 tablet 0   traZODone (DESYREL) 50 MG tablet Take 1 tablet (50 mg total) by mouth at bedtime as needed for sleep. (Patient not taking: Reported on 04/10/2022) 30 tablet 0    Musculoskeletal: Strength & Muscle Tone: within normal limits Gait & Station: normal Patient leans: N/A  Psychiatric Specialty Exam:  Presentation  General Appearance: Fairly Groomed  Eye Contact:Minimal  Speech:Garbled  Speech Volume:Decreased  Handedness:Right   Mood and Affect  Mood:Depressed  Affect:Depressed   Thought Process  Thought Processes:Coherent  Descriptions of Associations:Intact  Orientation:Full (Time, Place and Person)  Thought Content:Logical  History of Schizophrenia/Schizoaffective disorder:Yes  Duration of Psychotic Symptoms:Greater than six months  Hallucinations:Hallucinations: Auditory Description of Auditory Hallucinations: "The voices are telling me to hurt people."  Ideas of Reference:None  Suicidal Thoughts:Suicidal Thoughts: No  Homicidal Thoughts:Homicidal Thoughts: No   Sensorium  Memory:Immediate Fair; Recent Fair; Remote Fair  Judgment:Intact  Insight:Present   Executive Functions  Concentration:Fair  Attention Span:Fair  Recall:Fair  Fund of Knowledge:Fair  Language:Fair   Psychomotor Activity  Psychomotor Activity:Psychomotor Activity: Normal   Assets  Assets:Communication Skills; Desire for Improvement; Housing; Resilience; Social Support   Sleep  Sleep:Sleep: Good Number of Hours of Sleep: 8   Physical Exam: Physical Exam Vitals and nursing note reviewed.  Constitutional:      Appearance: Normal appearance. He is normal weight.  HENT:     Head: Normocephalic and atraumatic.     Right Ear: External ear normal.     Left Ear: External ear normal.     Nose: Nose normal.  Cardiovascular:     Rate and Rhythm: Normal rate.     Pulses: Normal pulses.  Pulmonary:      Effort: Pulmonary effort is normal.  Musculoskeletal:        General: Normal range of motion.     Cervical back: Normal range of motion and neck supple.  Neurological:     General: No focal deficit present.     Mental Status: He is alert and oriented to person, place, and time.  Psychiatric:        Attention and Perception: Attention and perception normal.        Mood and Affect: Mood is depressed. Affect is flat.        Speech: Speech is delayed.        Behavior: Behavior normal. Behavior is cooperative.        Thought Content: Thought content normal.        Cognition and Memory: Cognition and memory normal.        Judgment: Judgment normal.   Review of Systems  Psychiatric/Behavioral:  Positive for depression and substance abuse.   All other systems reviewed and are negative. Blood pressure 130/69, pulse 74, temperature 97.6 F (36.4 C), temperature source Oral, resp. rate 16, SpO2 97 %. There is no height or weight on file to calculate BMI.  Treatment Plan Summary: Plan -Patient does not meet criteria for psychiatric inpatient admission  Disposition: No evidence of imminent risk to self or  others at present.   Patient does not meet criteria for psychiatric inpatient admission. Supportive therapy provided about ongoing stressors. Discussed crisis plan, support from social network, calling 911, coming to the Emergency Department, and calling Suicide Hotline.  Gillermo Murdoch, NP 04/10/2022 11:30 PM

## 2022-04-10 NOTE — ED Notes (Signed)
Report received from Ally, RN including SBAR. Patient alert and oriented, warm and dry, and in no acute distress. Patient denies SI, HI, VH and pain. Patient made aware of Q15 minute rounds and Rover and Officer presence for their safety. Patient instructed to come to this nurse with needs or concerns. 

## 2022-04-10 NOTE — ED Notes (Signed)
VOL  PENDING  ressessment

## 2022-04-10 NOTE — ED Notes (Signed)
VOL/pending reassessment in the AM 

## 2022-04-11 ENCOUNTER — Encounter (HOSPITAL_COMMUNITY): Payer: Self-pay | Admitting: Emergency Medicine

## 2022-04-11 ENCOUNTER — Other Ambulatory Visit: Payer: Self-pay

## 2022-04-11 ENCOUNTER — Emergency Department (HOSPITAL_COMMUNITY)
Admission: EM | Admit: 2022-04-11 | Discharge: 2022-04-11 | Disposition: A | Discharge status: Home / Self Care | Payer: Medicaid Other | Attending: Emergency Medicine | Admitting: Emergency Medicine

## 2022-04-11 ENCOUNTER — Emergency Department (EMERGENCY_DEPARTMENT_HOSPITAL)
Admission: EM | Admit: 2022-04-11 | Discharge: 2022-04-12 | Disposition: A | Discharge status: Home / Self Care | Payer: Medicaid Other | Source: Home / Self Care | Attending: Emergency Medicine | Admitting: Emergency Medicine

## 2022-04-11 ENCOUNTER — Ambulatory Visit (HOSPITAL_COMMUNITY)
Admission: EM | Admit: 2022-04-11 | Discharge: 2022-04-11 | Disposition: A | Discharge status: Home / Self Care | Payer: Medicaid Other | Source: Home / Self Care

## 2022-04-11 ENCOUNTER — Encounter (HOSPITAL_COMMUNITY): Payer: Self-pay | Admitting: Registered Nurse

## 2022-04-11 DIAGNOSIS — F10239 Alcohol dependence with withdrawal, unspecified: Secondary | ICD-10-CM | POA: Diagnosis not present

## 2022-04-11 DIAGNOSIS — Z5900 Homelessness unspecified: Secondary | ICD-10-CM | POA: Insufficient documentation

## 2022-04-11 DIAGNOSIS — Z765 Malingerer [conscious simulation]: Secondary | ICD-10-CM | POA: Diagnosis not present

## 2022-04-11 DIAGNOSIS — F1994 Other psychoactive substance use, unspecified with psychoactive substance-induced mood disorder: Secondary | ICD-10-CM | POA: Diagnosis present

## 2022-04-11 DIAGNOSIS — J45909 Unspecified asthma, uncomplicated: Secondary | ICD-10-CM | POA: Diagnosis not present

## 2022-04-11 DIAGNOSIS — F1910 Other psychoactive substance abuse, uncomplicated: Secondary | ICD-10-CM | POA: Diagnosis present

## 2022-04-11 DIAGNOSIS — R45851 Suicidal ideations: Secondary | ICD-10-CM | POA: Insufficient documentation

## 2022-04-11 DIAGNOSIS — F17210 Nicotine dependence, cigarettes, uncomplicated: Secondary | ICD-10-CM | POA: Insufficient documentation

## 2022-04-11 DIAGNOSIS — F251 Schizoaffective disorder, depressive type: Secondary | ICD-10-CM | POA: Insufficient documentation

## 2022-04-11 DIAGNOSIS — R440 Auditory hallucinations: Secondary | ICD-10-CM | POA: Insufficient documentation

## 2022-04-11 DIAGNOSIS — F1920 Other psychoactive substance dependence, uncomplicated: Secondary | ICD-10-CM

## 2022-04-11 DIAGNOSIS — F411 Generalized anxiety disorder: Secondary | ICD-10-CM

## 2022-04-11 LAB — CBC
HCT: 37.7 % — ABNORMAL LOW (ref 39.0–52.0)
Hemoglobin: 13.8 g/dL (ref 13.0–17.0)
MCH: 29.8 pg (ref 26.0–34.0)
MCHC: 36.6 g/dL — ABNORMAL HIGH (ref 30.0–36.0)
MCV: 81.4 fL (ref 80.0–100.0)
Platelets: 213 10*3/uL (ref 150–400)
RBC: 4.63 MIL/uL (ref 4.22–5.81)
RDW: 14.6 % (ref 11.5–15.5)
WBC: 9.1 10*3/uL (ref 4.0–10.5)
nRBC: 0 % (ref 0.0–0.2)

## 2022-04-11 LAB — COMPREHENSIVE METABOLIC PANEL
ALT: 48 U/L — ABNORMAL HIGH (ref 0–44)
AST: 41 U/L (ref 15–41)
Albumin: 3.8 g/dL (ref 3.5–5.0)
Alkaline Phosphatase: 34 U/L — ABNORMAL LOW (ref 38–126)
Anion gap: 7 (ref 5–15)
BUN: 7 mg/dL (ref 6–20)
CO2: 25 mmol/L (ref 22–32)
Calcium: 8.8 mg/dL — ABNORMAL LOW (ref 8.9–10.3)
Chloride: 105 mmol/L (ref 98–111)
Creatinine, Ser: 0.76 mg/dL (ref 0.61–1.24)
GFR, Estimated: >60 mL/min (ref >60)
Glucose, Bld: 83 mg/dL (ref 70–99)
Potassium: 4 mmol/L (ref 3.5–5.1)
Sodium: 137 mmol/L (ref 135–145)
Total Bilirubin: 0.7 mg/dL (ref 0.3–1.2)
Total Protein: 7.2 g/dL (ref 6.5–8.1)

## 2022-04-11 LAB — RAPID URINE DRUG SCREEN, HOSP PERFORMED
Amphetamines: NOT DETECTED
Barbiturates: NOT DETECTED
Benzodiazepines: POSITIVE — AB
Cocaine: POSITIVE — AB
Opiates: NOT DETECTED
Tetrahydrocannabinol: POSITIVE — AB

## 2022-04-11 LAB — ACETAMINOPHEN LEVEL: Acetaminophen (Tylenol), Serum: <10 ug/mL — ABNORMAL LOW (ref 10–30)

## 2022-04-11 LAB — ETHANOL: Alcohol, Ethyl (B): <10 mg/dL (ref <10)

## 2022-04-11 LAB — SALICYLATE LEVEL: Salicylate Lvl: <7 mg/dL — ABNORMAL LOW (ref 7.0–30.0)

## 2022-04-11 MED ORDER — ONDANSETRON HCL 4 MG PO TABS: 4.0000 mg | ORAL_TABLET | Freq: Three times a day (TID) | ORAL | Status: DC | PRN

## 2022-04-11 MED ORDER — ACETAMINOPHEN 500 MG PO TABS
1000.0000 mg | ORAL_TABLET | Freq: Once | ORAL | Status: AC
Start: 2022-04-11 — End: 2022-04-11
  Administered 2022-04-11: 1000 mg via ORAL
  Filled 2022-04-11: qty 2

## 2022-04-11 MED ORDER — ACETAMINOPHEN 325 MG PO TABS: 650.0000 mg | ORAL_TABLET | ORAL | Status: DC | PRN

## 2022-04-11 NOTE — Discharge Instructions (Signed)
F/u with outpatient substance abuse program F/[facility] with [facility]

## 2022-04-11 NOTE — ED Notes (Signed)
[employer] dept was called to come back and transport patient due to him being d/c. Pt was waiting in Lobby area because he wanted to be able to use phone. Pt then decided to walk off property and not wait for transportation

## 2022-04-11 NOTE — ED Provider Notes (Signed)
[facility] Emergency Department Provider Note MRN:  [id number]  Arrival date & time: 04/11/22     Chief Complaint   Detox History of Present Illness   Dominic [name] is a 46 y.o. year-old male with history of bipolar disorder presenting to the ED with chief complaint of detox.  Patient wants help detoxing from drugs and alcohol.  Last used marijuana, alcohol, cocaine yesterday.  Currently without pain or complaints.  Denies SI.  Review of Systems  A thorough review of systems was obtained and all systems are negative except as noted in the HPI and PMH.   Patient's Health History    Past Medical History:  Diagnosis Date   Anxiety    Asthma    Bipolar 1 disorder ([place])    Chronic hepatitis C without hepatic coma ([place]) 05/28/2018   Depression    Hepatitis C    Hepatitis C antibody positive in blood 05/28/2018   Malingering 03/30/2022   Psychiatry note from [facility] states "directly admitted to fabricating SI and CAH to obtain [psychiatric] admission"   Schizophrenia ([place])    Sleep apnea     Past Surgical History:  Procedure Laterality Date   CYST REMOVAL NECK     neck    Family History  Problem Relation Age of Onset   Hypertension Sister    Schizophrenia Sister     Social History   Socioeconomic History   Marital status: Single    Spouse name: Not on file   Number of children: Not on file   Years of education: Not on file   Highest education level: Not on file  Occupational History   Not on file  Tobacco Use   Smoking status: Some Days    Packs/day: 1.00    Types: Cigarettes   Smokeless tobacco: Never  Vaping Use   Vaping Use: Never used  Substance and Sexual Activity   Alcohol use: Yes    Alcohol/week: 3.0 standard drinks    Types: 3 Shots of liquor per week   Drug use: Yes    Types: Cocaine, Marijuana   Sexual activity: Not Currently  Other Topics Concern   Not on file  Social History Narrative   Not on file   Social  Determinants of Health   Financial Resource Strain: Not on file  Food Insecurity: Not on file  Transportation Needs: Not on file  Physical Activity: Not on file  Stress: Not on file  Social Connections: Not on file  Intimate Partner Violence: Not on file     Physical Exam   Vitals:   04/11/22 0250  BP: 126/75  Pulse: 71  Resp: 18  Temp: 97.9 F (36.6 C)  SpO2: 98%    CONSTITUTIONAL: Well-appearing, NAD NEURO/PSYCH:  Alert and oriented x 3, no focal deficits EYES:  eyes equal and reactive ENT/NECK:  no LAD, no JVD CARDIO: Regular rate, well-perfused, normal S1 and S2 PULM:  CTAB no wheezing or rhonchi GI/GU:  non-distended, non-tender MSK/SPINE:  No gross deformities, no edema SKIN:  no rash, atraumatic   *Additional and/or pertinent findings included in MDM below  Diagnostic and Interventional Summary    EKG Interpretation  Date/Time:    Ventricular Rate:    PR Interval:    QRS Duration:   QT Interval:    QTC Calculation:   R Axis:     Text Interpretation:         Labs Reviewed - No data to display  No orders to  display    Medications  acetaminophen (TYLENOL) tablet 1,000 mg (has no administration in time range)     Procedures  /  Critical Care Procedures  ED Course and Medical Decision Making  Initial Impression and Ddx Patient would like resources for substance use disorder.  Initially was interested in hospitalization but we discussed how given his lack of acute withdrawal this is not an option.  He has some leg soreness because he walked here from [employer] station, providing Tylenol.  Otherwise no complaints, well-appearing, normal vital signs, appropriate for discharge with resources.  Past medical/surgical history that increases complexity of ED encounter: Bipolar disorder, substance use disorder  Interpretation of Diagnostics Not applicable Patient Reassessment and Ultimate Disposition/Management Discharge  Patient management required  discussion with the following services or consulting groups:  None  Complexity of Problems Addressed Acute complicated illness or Injury  Additional Data Reviewed and Analyzed Further history obtained from: Prior ED visit notes  Additional Factors Impacting ED Encounter Risk None  Barth Kirks. Sedonia Small, [place] [email or web address]  Final Clinical Impressions(s) / ED Diagnoses     ICD-10-CM   1. Drug dependence ([place])  F19.20       ED Discharge Orders     None        Discharge Instructions Discussed with and Provided to Patient:    Discharge Instructions      You were evaluated in the Emergency Department and after careful evaluation, we did not find any emergent condition requiring admission or further testing in the hospital.  Your exam/testing today was overall reassuring.  Use the resources provided to continue working on your substance use issues.  Please return to the Emergency Department if you experience any worsening of your condition.  Thank you for allowing [place] to be a part of your care.       Maudie Flakes, MD 04/11/22 [phone]

## 2022-04-11 NOTE — Discharge Instructions (Addendum)
Walk in hours for medication management Monday, Wednesday, Thursday, and Friday from 8:00 AM to 11:00 AM Recommend arriving by by 7:30 AM.  It is first come first serve.    Walk in hours for therapy intake Monday and Wednesday only 8:00 AM to 11:00 AM Encouraged to arrive by 7:30 AM.  It is first come first serve   [facility]  Tuesday 8:00 AM to 12:00 PM and Thursday 11:00 AM to 4:30 PM Call to schedule an appointment if it is going to be your initial appointment for intake.     Substance Abuse Treatment Programs  Intensive Outpatient Programs [facility]     [address]      [place], [place]                   [phone]       [facility] [address] [place], [place] [phone]  [facility]     (Inpatient and outpatient)     [address]           [phone]    [facility] [phone] (Suboxone and Methadone)  [address]      [place], [place] [address]      [phone]       [address] [place], [place] [phone]  [facility] (Outpatient/Inpatient, Chemical)    (insurance only) [phone]             [facility] (Groups & Residential) [place], [place] [phone]     [facility]     [address]     [place], [place]      [phone]       [facility] (for caregivers and family) [phone] [address] [place], [place] [phone]      Residential Treatment Programs [facility]      [address], [place], [place] [address]  [phone]       [facility] [address] [place], [place] [address] [phone]  [facility]        [phone]       [facility] [phone]  [facility] ([facility]             [address]                                         [place], [place]                                                [phone] or [phone]                                [facility] [place] [address] [place], [id number] [phone]  [facility]    [address]      [place], [place]     [phone]       [facility] [address] [place], [place] [phone]  [facility]   [address]     [place], [place] [address]     [phone]  Admissions: 8am-3pm M-F  [facility] (RT[facility]) [address] [place], [place] [phone]  [facility]: Residential Program ([address])   [place], [place]      [phone] or [phone]     [facility]: [facility] [place], [place] (Walk in Hours over the weekend or by referral)  [facility] [address] [place], [place], [place] [address] [phone]  Crisis Mobile: [facility]:  [phone] (for crisis response 24 hours a day) [facility] Hotline:      [phone] Outpatient Psychiatry and Counseling  [facility]: Mobile Crisis Management 24 hours:  [phone]  [facility] of the [employer] sliding scale fee and walk in schedule: M-F 8am-12pm/1pm-3pm [address]  [place], [place] [address] [phone]  [facility] [address] [place], [place] [address] [phone]  [facility] (Formerly known as [facility] [employer])- new patient walk-in appointments available Monday - Friday 8am -3pm.          [address] [place], [place] [address] [phone] or crisis line- [phone]  [facility]/ Intensive Outpatient Therapy Program [address] [place], [place] [address] [phone]  [facility]                  Crisis Services      [phone] [address]     [place], [place] [address]                 [place] [facility]   [facility] [phone]. [address] [place], [place]  [address]   [facility]          [address]  [place], [place] [address]       [phone]  [facility] [address] [place], [place] [address] [phone]  [facility]    [address]    [place], [place] [address]     [phone]       Andee Poles, MD     [address]     [place] [address]     [phone]       [facility] [address] [place] [address]  [facility]     [address] [place], [place]      [phone]       [facility] Eulogio Ditch, MD [address] [place], [place] [address] [phone]  [facility]     [address]     [place], [place] [address]     [phone]       [facility] [address] [place], [place] [address] [phone] Resources for Temporary Residential Assistance/Crisis Centers  [facility]ENT[facility]) M-F 8am-3pm   [address] [place], [place] [address]   [phone] Services include: laundry, barbering, support groups, case management, phone  & computer access, showers, AA/NA mtgs, mental health/substance abuse nurse, job skills class, disability information, VA assistance, spiritual classes, etc.   HOMELESS SHELTERS  [facility]     [facility]   [address], [place]     [phone]              [employer] (women and children)       [address] [place], [place]  [address] [phone] [email or web address] for application and process Application Required  [facility]   [address]    [place] [address]     [phone]                    [facility] [address] [place], [place] [address] [phone] application appt.) Application Required  [facility]  (women only)    [address]     [place], [place] [address]     [phone]      Intake starts 6pm daily Need valid ID, SSC, & Police report [employer] [address] [place], [place] [phone] Application Required  [employer] (men only)     [address]      [place], [place]     [phone]       [facility] (Pregnant women only) [address] [place], [place] [phone]  [facility]      [address]      [place], [place] [address]     [phone]             [facility] [address] [place], [place] [phone] 90 day commitment/SA/Application process  [facility](men only)     [address]     [place], [place]     [phone]       Check-in at [facility] ([facility]) [address] [place], [place] [address] [phone] Men/Women/Women and Children must be there by 7 pm  [facility] [place], [place] [phone]

## 2022-04-11 NOTE — ED Triage Notes (Signed)
Pt here for suicidal ideations. Per pt he has not received his injection yet and believes the abilify isn't working anymore. Pt endorses SI w/ plan to hang himself and auditory hallucinations, denis HI. Pt states he was taken to behavioral health earlier today for same.

## 2022-04-11 NOTE — Progress Notes (Signed)
   04/11/22 1850  [facility] Triage Screening (Walk-ins at [facility] only)  How Did You Hear About [place]? Self  What Is the Reason for Your Visit/Call Today? Pt is a 46 yo male who presented voluntarily and unaccompanied. Pt has been in most of the local EDs multiple days recently with similar presentation. Pt reports SI chronically and some plans that he reported that he will not go through with. Today he reported wanting to "lay down in traffic" but added "but I didn't." Pt denied HI, NSSH, and paranoia. Pt reported AVH in the form of "hearing voices." Pt stated they were telling him to sing a song and then he sang a song. Pt did not appear to be repsonding to internal stimuli or delusional. Pt reported polysubstance abuse and was vague with the details.  How Long Has This Been Causing You Problems? > than 6 months  Have You Recently Had Any Thoughts About Hurting Yourself? Yes  Are You Planning to Commit Suicide/Harm Yourself At This time? No  Have you Recently Had Thoughts About Hurting Someone [name]? No  Are You Planning To Harm Someone At This Time? No  Are you currently experiencing any auditory, visual or other hallucinations? Yes (Pt did not appear to be hearing voices, responding to internal stimuli, delusional or intoxicated.)  Have You Used Any Alcohol or Drugs in the Past 24 Hours?  (uta)  Do you have any current medical co-morbidities that require immediate attention?  Rich Reining)  Clinician description of patient physical appearance/behavior: Pt was calm, cooperative, alert and appeared fully oriented. Pt did not appear to be responding to internal stimuli or experiencing delusional thinking. Pt's speech and movement appeared within normal limits and her appearance was unremarkable. Pt's mood seemed tired and somewhat depressed and he had a flat affect which was congruent. Pt's judgment and insight seemed impaired.  What Do You Feel Would Help You the Most Today? Alcohol or Drug Use Treatment   Determination of Need Routine (7 days) (Per Suvon Rankin NP pt is psychiatrically cleared. Resources concerning drug treatment/OP therapy/medication managment have been offered in the past and were offered again for immediate follow-up.)   Corrie Dandy T. Jimmye Norman, MS, [facility], [facility] Triage Specialist [facility]

## 2022-04-11 NOTE — ED Notes (Signed)
Gave pt bag lunch and drink.  Pt was calm and polite, Passenger transport manager.

## 2022-04-11 NOTE — ED Notes (Signed)
[employer] called for transport home.

## 2022-04-11 NOTE — Progress Notes (Signed)
   04/10/22 2355  [facility] Triage Screening (Walk-ins at [facility] only)  How Did You Hear About [place]? Self  What Is the Reason for Your Visit/Call Today? Pt [facility] voluntarily via law enforcement after being discharged from the ED at [facility]. Pt says he thought he was being admitted for substance abuse treatment and was upset that he was discharged. He says he went to his mother's house and she encouraged him to seek treatment at [facility]. He says he smokes 1 gram of crack daily, drinks approximately 6 cans of beer daily, inhales fentanyl approximately twice per week, and smokes marijuana 1-2 times per week. He says he lost his identification and food stamps and needs assistance so he can get a room at a boarding house. He describes his mood as depressed. He denies current suicidal ideation and displays healed marks on his arms and torso from when he has attempted suicide in the past. He denies homicidal ideation or history of violence but Pt's medical record indicates a history of aggressive behavior. He denies auditory or visual hallucinations. He reports taking Abilify and trazodone but does not believe they are effective "because if I was on the right medication I would not use drugs."  How Long Has This Been Causing You Problems? 1 wk - 1 month  Have You Recently Had Any Thoughts About Hurting Yourself? No  Are You Planning to Commit Suicide/Harm Yourself At This time? No  Have you Recently Had Thoughts About Hurting Someone [name]? No  Are You Planning To Harm Someone At This Time? No  Are you currently experiencing any auditory, visual or other hallucinations? No  Have You Used Any Alcohol or Drugs in the Past 24 Hours? Yes  How long ago did you use Drugs or Alcohol? Yesterday  What Did You Use and How Much? Small amount of fentanyl  Do you have any current medical co-morbidities that require immediate attention? No  Clinician description of patient physical appearance/behavior: Pt is dressed in a tank  top and jeans. He alert and oriented x4. Pt speaks in a clear tone, at moderate volume and normal pace. Motor behavior appears normal. Eye contact is good. Pt's mood is euthymic and affect is congruent with mood. Thought process is coherent and relevant. There is no indication Pt is currently responding to internal stimuli or experiencing delusional thought content. He is cooperative.  What Do You Feel Would Help You the Most Today? Alcohol or Drug Use Treatment;Treatment for Depression or other mood problem;Medication(s)  If access to [facility] was not available, would you have sought care in the Emergency Department? Yes  Determination of Need Routine (7 days)  Options For Referral Chemical Dependency Intensive Outpatient Therapy (CDIOP);Outpatient Therapy;Medication Management

## 2022-04-11 NOTE — ED Provider Notes (Signed)
[facility] EMERGENCY DEPARTMENT Provider Note   CSN: [id number] Arrival date & time: 04/11/22  2017     History  Chief Complaint  Patient presents with   Suicidal    Keavon Seve Monette is a 46 y.o. male with a past medical history of schizophrenia, schizoaffective disorder, paranoid schizophrenia, polysubstance abuse who presents to the emergency department complaining of SI onset today.  He notes he has auditory hallucinations that tell him to hang himself or go out into traffic.  No meds tried prior to arrival.  Denies HI or visual hallucinations.   The history is provided by the patient. No language interpreter was used.      Home Medications Prior to Admission medications   Medication Sig Start Date End Date Taking? Authorizing Provider  ARIPiprazole (ABILIFY) 10 MG tablet Take 1 tablet (10 mg total) by mouth daily. Patient not taking: Reported on 04/10/2022 02/18/22   Layla Barter, NP  citalopram (CELEXA) 20 MG tablet Take 1 tablet (20 mg total) by mouth daily. Patient not taking: Reported on 03/10/2022 02/17/22   Layla Barter, NP  traZODone (DESYREL) 50 MG tablet Take 1 tablet (50 mg total) by mouth at bedtime as needed for sleep. Patient not taking: Reported on 04/10/2022 02/17/22   Layla Barter, NP      Allergies    Patient has no known allergies.    Review of Systems   Review of Systems  All other systems reviewed and are negative.  Physical Exam Updated Vital Signs BP 115/73   Pulse (!) 52   Temp 98 F (36.7 C)   Resp 18   SpO2 96%  Physical Exam Vitals and nursing note reviewed.  Constitutional:      General: He is not in acute distress.    Appearance: He is not diaphoretic.  HENT:     Head: Normocephalic and atraumatic.     Mouth/Throat:     Pharynx: No oropharyngeal exudate.  Eyes:     General: No scleral icterus.    Conjunctiva/sclera: Conjunctivae normal.  Cardiovascular:     Rate and Rhythm: Normal rate and regular  rhythm.     Pulses: Normal pulses.     Heart sounds: Normal heart sounds.  Pulmonary:     Effort: Pulmonary effort is normal. No respiratory distress.     Breath sounds: Normal breath sounds. No wheezing.  Abdominal:     General: Bowel sounds are normal.     Palpations: Abdomen is soft. There is no mass.     Tenderness: There is no abdominal tenderness. There is no guarding or rebound.  Musculoskeletal:        General: Normal range of motion.     Cervical back: Normal range of motion and neck supple.  Skin:    General: Skin is warm and dry.  Neurological:     Mental Status: He is alert.  Psychiatric:        Behavior: Behavior normal.    ED Results / Procedures / Treatments   Labs (all labs ordered are listed, but only abnormal results are displayed) Labs Reviewed  COMPREHENSIVE METABOLIC PANEL - Abnormal; Notable for the following components:      Result Value   Calcium 8.8 (*)    ALT 48 (*)    Alkaline Phosphatase 34 (*)    All other components within normal limits  SALICYLATE LEVEL - Abnormal; Notable for the following components:   Salicylate Lvl <7.0 (*)  All other components within normal limits  ACETAMINOPHEN LEVEL - Abnormal; Notable for the following components:   Acetaminophen (Tylenol), Serum <10 (*)    All other components within normal limits  CBC - Abnormal; Notable for the following components:   HCT 37.7 (*)    MCHC 36.6 (*)    All other components within normal limits  RAPID URINE DRUG SCREEN, HOSP PERFORMED - Abnormal; Notable for the following components:   Cocaine POSITIVE (*)    Benzodiazepines POSITIVE (*)    Tetrahydrocannabinol POSITIVE (*)    All other components within normal limits  ETHANOL    EKG None  Radiology No results found.  Procedures Procedures    Medications Ordered in ED Medications  ondansetron (ZOFRAN) tablet 4 mg (has no administration in time range)  acetaminophen (TYLENOL) tablet 650 mg (has no administration in  time range)    ED Course/ Medical Decision Making/ A&P                           Medical Decision Making Amount and/or Complexity of Data Reviewed Labs: ordered.  Risk OTC drugs. Prescription drug management.   Pt presents with concerns for SI onset today.  Also complains of auditory hallucinations telling him to hang himself and go run out into traffic.  Notes this all began once DSS took his kids away from him.  Denies HI at this time.  History of schizophrenia, schizoaffective disorder.  No meds.  Vital signs stable, patient afebrile.  On exam patient without acute cardiovascular, respiratory, abdominal exam findings.  Co morbidities that complicate the patient evaluation: Schizophrenia, schizoaffective disorder  Additional history obtained:  External records from outside source obtained and reviewed including: Patient was evaluated to be [facility] earlier today for similar concerns.  At that time he was discharged.   Labs:  I ordered, and personally interpreted labs.  The pertinent results include:   UDS notable positive for cocaine, benzodiazepine, THC Acetaminophen, salicylate, ethanol unremarkable CMP and CBC unremarkable  Social Determinants of Health: Homeless  Disposition: Presentation suspicious for SI. After consideration of the diagnostic results and the patients response to treatment, I feel that the patient would benefit from TTS consult.  Labs without acute findings, patient medically cleared at this time. TTS consult placed.    This chart was dictated using voice recognition software, Dragon. Despite the best efforts of this provider to proofread and correct errors, errors may still occur which can change documentation meaning.  Final Clinical Impression(s) / ED Diagnoses Final diagnoses:  Suicidal ideation    Rx / DC Orders ED Discharge Orders     None         Hue Frick A, PA-C 04/11/22 2248    Terrilee Files, MD 04/12/22 [phone]

## 2022-04-11 NOTE — ED Triage Notes (Signed)
Pt states he is here to detox. Last cocaine and marijuana use "earlier today". Denies SI and HI. States he was supposed to get inpatient treatment but did not go. Respirations even and unlabored. NAD noted at this time.

## 2022-04-11 NOTE — Discharge Instructions (Addendum)
You were evaluated in the Emergency Department and after careful evaluation, we did not find any emergent condition requiring admission or further testing in the hospital.  Your exam/testing today was overall reassuring.  Use the resources provided to continue working on your substance use issues.  Please return to the Emergency Department if you experience any worsening of your condition.  Thank you for allowing [place] to be a part of your care.

## 2022-04-11 NOTE — ED Provider Notes (Signed)
Behavioral Health Urgent Care Medical Screening Exam  Patient Name: Dominic [name] MRN: [id number] Date of Evaluation: 04/11/22 Chief Complaint:   Diagnosis:  Final diagnoses:  Substance induced mood disorder (HCC)  Polysubstance abuse (HCC)  Homelessness    Recent Visits Chart Review     04/11/22 ED (Discharge) Drug dependence [facility](HCC) (Primary Dx) Sabas SousBero, Michael M, MD [facility]  04/10/22 ED (Discharge) Homelessness unspecified (Primary Dx); Substance abuse [facility](HCC)  [facility]  04/10/22 ED (Discharge) Polysubstance abuse [facility](HCC) (Primary Dx) Loleta RoseForbach, Cory, MD [facility]  04/09/22 ED (Discharge) Suicidal intent (Primary Dx) Delton PrairieSmith, Dylan, MD [facility]  03/10/22 ED (Discharge) Cocaine use disorder, moderate, dependence (HCC) (Primary Dx); Alcohol abuse  [facility]            History of Present illness: Dominic [name] is a 46 y.o. male patient [facility] with complaints of suicidal ideation and want to get back on his medications.    Dominic [name], 46 y.o., male patient seen face to face by this provider, consulted with Dr. Earlene PlaterKatherine Laubach; and chart reviewed on 04/11/22.  On evaluation Dominic [name] is laying on the floor with paper covering his face.  Asked if he could give me his name and he states "Dominic [name]" Informed that I was in the wrong room and begin to walk out when he comes into the hall way.  He is asked his name again and he states "Dominic [name] that's want I told you."   This writer informed him that the name he gave was Dominic [name] and in order for this assessment to take place I need to be sure I am speaking with the right person.  He then gives his correct name.  Patient asked what [facility] and he states  "Every since DSS took my kids I have been depressed  and I back slide and started using drugs.  I got these voices in my mind telling me to lay in traffic.  No I don't have a plan.  I didn't do it, I wouldn't do it.  I just need to get on my medication.  Can you give me a Lithium or something.  I use to come here up stairs and get my shot."  Patient asked where he was coming  from and if he had just been discharged from the hospital and he stated started singing "Been around the world and I, I, I can't find my baby, I don't know when, I don't know why she's gone away, And I don't know where she can be, my baby, But I'm gonna find her."  Patient asked what he needed, what he felt would help and he states "I'm like Humpty Dumpty I'm broken like that."  Patient the starts asking the name of the nurses that work in the shot clinic after hearing the name Fannie KneeSue he states "That's her, tell her I need to see her."  Patient informed that outpatient staff has left for the day but the shot clinic was open on Tuesday and Thursday.  Also informed he could do walk in to re-establish services.  Patient then states he has to go to the bathroom "I got to pee." During evaluation Dominic [name] is lying on floor with paper covering his eyes.  He pretends to be someone else on initial presentation to the room  but eventually gives the correct name.  There is not noted distress.  He is alert/oriented x 4; calm and somewhat cooperative.  His mood is playful and euthymic with congruent affect.  He is speaking in a clear tone at moderate volume, and normal pace; with fair eye contact.  His thought process is coherent.  There is no indication that he is currently responding to internal/external stimuli or experiencing delusional thought content; but he states he is hearing voices.  He denies homicidal ideation and paranoia; but is stating that he is having suicidal thoughts with no intent or specific plan, then states he wouldn't act on suicidal thoughts.  He was [facility] discharged [facility] ED  less than an hour ago where he denied suicidal/homicidal ideation, psychosis, and paranoia.  Patient has been seen in urgent care and ED several times in the last several days with similar complaints also stating that he is homeless and has no money related to losing ID, DBT, and EBT card and can't pay for anywhere to stay.  Patient has been given resources for community services such as shelters, [facility], outpatient psychiatric services, and rehab/substance abuse services that he hasn't attempted to follow up on.  He has went from one ED/UC to the next.  See above recent visits for the last 3 days.     Malingering: This patient has a significant history of malingering evident by multiple emergency room (ED), urgent care (UC) visits, and psychiatric hospitalizations with similar complaints of suicidal ideation.  Patient has been observed overnight in the ED and UC and there has been no unsafe behavior observed.  Patients' similar complaint of suicidal ideation is often resolved once housing or other need has been arranged for him.  While in the ED or UC he has demonstrated no unsafe behavior and there is no psychiatric condition which is amendable to inpatient psychiatric hospitalization at this time.  Patients' suicidal ideation is conditional and used as a means of manipulation and/or attention seeking without true intention to harm himself.  His behaviors are more consistent with someone who is acting in self-preservation, rather that someone who is acutely suicidal.  Given all that is stated above including the fact that the patient shows no signs of mania or psychosis, has a liner, coherent thought process throughout assessment the patient does not meet criteria for inpatient psychiatric hospitalization or further psychiatric evaluation at this time.  Patient would benefit from outpatient psychiatric services which resources will be provided along with community resources prior to discharge.    Patient  ongoing endorsement of suicidal ideation shows clear evidence of secondary gain of unmet needs that is representative of limited and often- maladaptive coping skills and rather than an indicator of imminent risk of death.  Evidence indicates that subsequent suicide attempts by patients who made contingent suicide threats (defined as threatened suicide or exaggerated suicidality) are uncommon in both groups.  Hospitalization should not be used as a substitute for social services, substance abuse treatment, and legal assistance for patients who make contingent suicide threats (Characteristics and six-month outcome of patients who use suicide threats to seek hospital admission.  (1966). Psychiatric Services, 47(8), [phone]. (DOI: 10.1176/ps.47.8.871).   Patient has not benefited from past hospitalization under similar circumstances in terms of suicide risk modification, improvement in mental health, or social conditions.  Patients repeated use of ED, UC, and psychiatric hospitalization services instead of recommended outpatient follow-up is ineffective in helping patients' improvement.  Psychiatric hospitalization is not indicated, and the  patient's refusal of interventions that have been offered by clinical care teams during prior stays in the ED, UC and during psychiatric hospitalization to mitigate risk of self-harm, other than allowing care team to observe to sobriety or behavior.  This provider and Dr. Earlene Plater have discussed assessment and treatment recommendations with patient.  Further we see no evidence of severe psychosis, cognitive impairment, intoxication, or other condition that prevents the patient from acting under their own choice and volition.       Psychiatric Specialty Exam  Presentation  General Appearance:Appropriate for Environment; Casual; Disheveled  Eye Contact:Fair  Speech:Clear and Coherent  Speech Volume:Normal  Handedness:Right   Mood and Affect   Mood:Euthymic  Affect:Congruent   Thought Process  Thought Processes:Coherent; Linear  Descriptions of Associations:Circumstantial  Orientation:Full (Time, Place and Person)  Thought Content:Logical  Diagnosis of Schizophrenia or Schizoaffective disorder in past: Yes  Duration of Psychotic Symptoms: Greater than six months  Hallucinations:Auditory States the voices told me to sing that song.  Prior started to sing song "Been Around the World"  Ideas of Reference:None  Suicidal Thoughts:Yes, Passive (Reporting that the voices are telling him to hurt himself) With Intent  Homicidal Thoughts:No   Sensorium  Memory:Immediate Good; Recent Good  Judgment:Intact  Insight:Fair   Executive Functions  Concentration:Fair  Attention Span:Fair  Recall:Good  Fund of Knowledge:Good  Language:Good   Psychomotor Activity  Psychomotor Activity:Normal   Assets  Assets:Communication Skills; Financial Resources/Insurance   Sleep  Sleep:Fair  Number of hours: 8   Nutritional Assessment (For OBS and FBC admissions only) Has the patient had a weight loss or gain of 10 pounds or more in the last 3 months?: No Has the patient had a decrease in food intake/or appetite?: No Does the patient have dental problems?: No Does the patient have eating habits or behaviors that may be indicators of an eating disorder including binging or inducing vomiting?: No Has the patient recently lost weight without trying?: 0 Has the patient been eating poorly because of a decreased appetite?: 0 Malnutrition Screening Tool Score: 0    Physical Exam: Physical Exam Vitals and nursing note reviewed. Exam conducted with a chaperone present.  Constitutional:      General: He is not in acute distress.    Appearance: Normal appearance. He is not ill-appearing.  Cardiovascular:     Rate and Rhythm: Normal rate.  Pulmonary:     Effort: Pulmonary effort is normal.  Musculoskeletal:         General: Normal range of motion.  Skin:    General: Skin is warm and dry.  Neurological:     Mental Status: He is alert and oriented to person, place, and time.  Psychiatric:        Attention and Perception: Attention and perception normal. He does not perceive auditory or visual hallucinations.        Mood and Affect: Mood and affect normal.        Speech: Speech normal.        Behavior: Behavior is cooperative.        Thought Content: Thought content normal. Thought content is not paranoid or delusional. Thought content does not include homicidal or suicidal ideation.        Cognition and Memory: Cognition normal.        Judgment: Judgment is impulsive.   Review of Systems  Constitutional: Negative.   HENT: Negative.    Eyes: Negative.   Respiratory: Negative.    Cardiovascular: Negative.  Gastrointestinal: Negative.   Genitourinary: Negative.   Musculoskeletal: Negative.   Skin: Negative.   Neurological: Negative.   Endo/Heme/Allergies: Negative.   Psychiatric/Behavioral:  Positive for substance abuse. Depression: Reporting depression,  but is laughing and joking during assessment. Hallucinations: States voices are telling him "to sing that song". Suicidal ideas: Reporting suicidal ideation but states no spicific plan and wouldn't go through with thoughts.Nervous/anxious: Stable.   Blood pressure 123/76, pulse 78, temperature 97.9 F (36.6 C), temperature source Oral, resp. rate 18, height  (1.803 m), weight 108.9 kg, SpO2 99 %. Body mass index is 33.47 kg/m.  Musculoskeletal: Strength & Muscle Tone: within normal limits Gait & Station: normal Patient leans: N/A   [facility] Discharge Disposition for Follow up and Recommendations: Based on my evaluation the patient does not appear to have an emergency medical condition and can be discharged with resources and follow up care in outpatient services for Medication Management, Substance Abuse Intensive Outpatient Program,  Individual Therapy, and Group Therapy    Discharge Instructions         Walk in hours for medication management Monday, Wednesday, Thursday, and Friday from 8:00 AM to 11:00 AM Recommend arriving by by 7:30 AM.  It is first come first serve.    Walk in hours for therapy intake Monday and Wednesday only 8:00 AM to 11:00 AM Encouraged to arrive by 7:30 AM.  It is first come first serve   [facility]  Tuesday 8:00 AM to 12:00 PM and Thursday 11:00 AM to 4:30 PM Call to schedule an appointment if it is going to be your initial appointment for intake.     Substance Abuse Treatment Programs  Intensive Outpatient Programs [facility]     [address]      [place], [place]                   [phone]       [facility] [address] [place], [place] [phone]  [facility]     (Inpatient and outpatient)     [address]           [phone]    [facility] [phone] (Suboxone and Methadone)  [address]      [place], [place] [address]      [phone]       [address] [place], [place] [phone]  [facility] (Outpatient/Inpatient, Chemical)    (insurance only) [phone]             [facility] (Groups & Residential) [place], [place] [phone]     [facility]     [address]     [place], [place]      [phone]       [facility] (for caregivers and family) [phone] [place], [place] [phone]      Residential Treatment Programs [facility]      [address] [place], [place] [address]  [phone]       [facility] [address], [place], [place] [address] [phone]  [facility] [place]        [phone]       [facility] [phone]  [facility] ([facility]             [address]  [place], [place]                                                 [phone] or [phone]                               [facility] [address] [place], [address] [phone]  [facility]    [address]      [place], [place]     [phone]       [facility] [address] [place], [place] [phone]  [facility]   [address] [place], [place] [address]     [phone]      Admissions: 8am-3pm M-F  [facility] (RTS) [address] [place], [place] [phone]  [facility]: Residential Program [phone] Days)   [place], [place]      [phone] or [phone]     [facility]: [facility] [place], [place] (Walk in Hours over the weekend or by referral)  [facility] [address] [place], [place], [place] [address] [phone]  Crisis Mobile: [facility]:  [phone] (for crisis response 24 hours a day) [facility] Hotline:      [phone] Outpatient Psychiatry and Counseling  [facility]: Mobile Crisis Management 24 hours:  [phone]  [facility] of the [employer] sliding scale fee and walk in schedule: M-F 8am-12pm/1pm-3pm [address]  [place], [place] [address] [phone]  [facility] [address] [place], [place] [address] [phone]  [facility] (Formerly [facility])- new patient walk-in appointments available Monday - Friday 8am -3pm.          [address] [place], [place] [address] [phone] or crisis line- [phone]  [facility] Out[facility] Services/ Intensive Outpatient Therapy Program [address] [place], [place] [address] [phone]  [facility]                  Crisis Services      [phone] [address]     [place], [place] [address]                 [facility]    [facility] [phone]. [address] [place], [place] [address]   [employer] [facility]          [address]  [place], [place] [address]       [phone]  [facility] [address] [place], [place] [address] [phone]  [facility]    [address]    [place], [place] [address]     [phone]       Andee Poles, MD     [address]     [place] [address]     [phone]       [facility] [address] [place] [address]  [facility]     [address]     [place], [place]      [phone]       [facility]io Ditch,  [address] [place], [place] [address] [phone]  [facility]     [address]     [place], [place] [address]     [phone]       [facility] [address] [place], [place] [address] [phone] Resources for Temporary Residential Assistance/Crisis Centers  [facility]) M-F 8am-3pm   [address] [place], [place] [address]   [phone] Services include: laundry, barbering, support groups, case management, phone  & computer access, showers, AA/NA mtgs, mental health/substance abuse nurse, job skills class, disability information, VA assistance, spiritual classes, etc.   HOMELESS SHELTERS  [facility]     [facility]   [address], [place]     [phone]              [employer] (women and children)       [address] [place], [place] [address] [phone] [email or web address] for application and process Application Required  [facility]   [address]    [place] [address]     [phone]                    [facility] [place] [address] [place], [place]  [address] [phone] application appt.) Application Required  [facility] (women only)    [address]     [place], [place] [address]     [phone]      Intake starts 6pm daily Need valid ID, SSC, & Police report [employer] [address] [place], [place] [phone] Application Required  [employer] (men only)     [address]      [place], [place]     [phone]       [facility] (Pregnant women only) [address] [place], [place] [phone]  [facility]      [address]      [place], [place] [address]     [phone]             [facility] [address] [place], [place] [phone] 90 day commitment/SA/Application process  [facility](men only)     [address]     [place], [place]     [phone]       Check-in at [facility] [address] [place], [place] [address] [phone] Men/Women/Women and Children must be there by 7 pm  [facility] [place], [place] [phone]                       Assunta Found, NP 04/11/2022, 7:00 PM

## 2022-04-11 NOTE — ED Provider Notes (Signed)
Behavioral Health Urgent Care Medical Screening Exam  Patient Name: Dominic [name] MRN: [id number] Date of Evaluation: 04/11/22 Chief Complaint:   Diagnosis:  Final diagnoses:  Homelessness unspecified  Substance abuse (HCC)    History of Present illness: Dominic [name] is a 46 y.o. male. With a history of substance abuse,  SI.  Present to [facility] by [employer] after [facility] was [facility].  Patient was seen at [facility] and discharge at 10:36pm  tonight and came straight [facility] to [facility].  per the patient he has pain in his leg, and wanted to detox.      Observation of patient, patient is alert and oriented x4 speech is clear, maintaining eye contact, mood is anxious affect congruent with mood.  Patient denies SI, HI, AVH, paranoia.  Per the patient he used alcohol last Friday, and crack cocaine/fentanyl 2 days ago.  According to the patient he was staying at some address in [place] but holds the landlord $550, he stated he lost his EBT and ID card and does not have the means to pay.  At this time patient does not meet criteria for inpatient admission, patient would best benefit from outpatient detox or substance abuse program.  Provide resources to patient for [facility] shelter.  Recommend discharge with resources  Psychiatric Specialty Exam  Presentation  General Appearance:Casual  Eye Contact:Fair  Speech:Clear and Coherent  Speech Volume:Normal  Handedness:Ambidextrous   Mood and Affect  Mood:Anxious; Angry  Affect:Congruent   Thought Process  Thought Processes:Coherent  Descriptions of Associations:Circumstantial  Orientation:Full (Time, Place and Person)  Thought Content:WDL  Diagnosis of Schizophrenia or Schizoaffective disorder in past: No  Duration of Psychotic Symptoms: Greater than six months  Hallucinations:None "The voices are telling me to hurt people."  Ideas of Reference:None  Suicidal Thoughts:No With  Intent  Homicidal Thoughts:No   Sensorium  Memory:Immediate Fair  Judgment:Poor  Insight:Fair   Executive Functions  Concentration:Fair  Attention Span:Fair  Recall:Fair  Fund of Knowledge:Fair  Language:Fair   Psychomotor Activity  Psychomotor Activity:Normal   Assets  Assets:Desire for Improvement; Housing   Sleep  Sleep:Fair  Number of hours: 8   Nutritional Assessment (For OBS and FBC admissions only) Has the patient had a weight loss or gain of 10 pounds or more in the last 3 months?: No Has the patient had a decrease in food intake/or appetite?: No Does the patient have dental problems?: No Does the patient have eating habits or behaviors that may be indicators of an eating disorder including binging or inducing vomiting?: No Has the patient recently lost weight without trying?: 0    Physical Exam: Physical Exam HENT:     Head: Normocephalic.     Nose: Nose normal.  Cardiovascular:     Rate and Rhythm: Normal rate.  Pulmonary:     Effort: Pulmonary effort is normal.  Musculoskeletal:        General: Normal range of motion.     Cervical back: Normal range of motion.  Skin:    General: Skin is warm.  Neurological:     General: No focal deficit present.     Mental Status: He is alert.  Psychiatric:        Mood and Affect: Mood normal.        Behavior: Behavior normal.        Thought Content: Thought content normal.        Judgment: Judgment normal.   Review of Systems  Constitutional: Negative.  HENT: Negative.    Eyes: Negative.   Respiratory: Negative.    Cardiovascular: Negative.   Gastrointestinal: Negative.   Genitourinary: Negative.   Musculoskeletal: Negative.   Skin: Negative.   Neurological: Negative.   Endo/Heme/Allergies: Negative.   Psychiatric/Behavioral:  The patient is nervous/anxious.   Blood pressure 135/90, pulse 68, temperature 97.8 F (36.6 C), temperature source Oral, resp. rate 18, SpO2 100 %. There is no  height or weight on file to calculate BMI.  Musculoskeletal: Strength & Muscle Tone: within normal limits Gait & Station: normal Patient leans: N/A   [facility] MSE Discharge Disposition for Follow up and Recommendations: Based on my evaluation the patient does not appear to have an emergency medical condition and can be discharged with resources and follow up care in outpatient services for Substance Abuse Intensive Outpatient Program and Individual Therapy   Sindy Guadeloupe, NP 04/11/2022, 12:31 AM

## 2022-04-11 NOTE — ED Notes (Signed)
RN reviewed discharge instructions with pt. Pt verbalized understanding and had no further questions. VSS upon discharge.  

## 2022-04-12 ENCOUNTER — Encounter (HOSPITAL_COMMUNITY): Payer: Self-pay | Admitting: Registered Nurse

## 2022-04-12 DIAGNOSIS — F1994 Other psychoactive substance use, unspecified with psychoactive substance-induced mood disorder: Secondary | ICD-10-CM

## 2022-04-12 DIAGNOSIS — Z765 Malingerer [conscious simulation]: Secondary | ICD-10-CM

## 2022-04-12 DIAGNOSIS — F251 Schizoaffective disorder, depressive type: Secondary | ICD-10-CM

## 2022-04-12 DIAGNOSIS — F1910 Other psychoactive substance abuse, uncomplicated: Secondary | ICD-10-CM

## 2022-04-12 MED ORDER — ARIPIPRAZOLE ER 400 MG IM SRER: 400.0000 mg | INTRAMUSCULAR | 1 refills | Status: DC

## 2022-04-12 MED ORDER — TRAZODONE HCL 50 MG PO TABS: 50.0000 mg | ORAL_TABLET | Freq: Every evening | ORAL | Status: DC | PRN

## 2022-04-12 MED ORDER — ARIPIPRAZOLE ER 400 MG IM SRER
400.0000 mg | INTRAMUSCULAR | Status: DC
Start: 2022-05-10 — End: 2022-04-12

## 2022-04-12 MED ORDER — ARIPIPRAZOLE 10 MG PO TABS: 10.0000 mg | ORAL_TABLET | Freq: Every day | ORAL | 0 refills | Status: DC

## 2022-04-12 MED ORDER — ARIPIPRAZOLE 10 MG PO TABS
10.0000 mg | ORAL_TABLET | Freq: Every day | ORAL | Status: DC
  Administered 2022-04-12: 10 mg via ORAL
  Filled 2022-04-12: qty 1

## 2022-04-12 MED ORDER — CITALOPRAM HYDROBROMIDE 20 MG PO TABS: 20.0000 mg | ORAL_TABLET | Freq: Every day | ORAL | 0 refills | Status: DC

## 2022-04-12 MED ORDER — CITALOPRAM HYDROBROMIDE 10 MG PO TABS
20.0000 mg | ORAL_TABLET | Freq: Every day | ORAL | Status: DC
  Administered 2022-04-12: 20 mg via ORAL
  Filled 2022-04-12: qty 2

## 2022-04-12 MED ORDER — ARIPIPRAZOLE ER 400 MG IM SRER
400.0000 mg | Freq: Once | INTRAMUSCULAR | Status: AC
  Administered 2022-04-12: 400 mg via INTRAMUSCULAR
  Filled 2022-04-12: qty 2

## 2022-04-12 NOTE — Progress Notes (Signed)
Patient requested as snack afrer talking doctor

## 2022-04-12 NOTE — ED Notes (Signed)
Patient denies pain and is resting comfortably. Pt provided with bus pass.

## 2022-04-12 NOTE — ED Notes (Signed)
Pt has AVS . This Clinical research associate reviewed with Pt to pick up Po med today and reviewed the 05-11-22 APPT for injection. Pt reports understanding he will need to pick up injection med before going for the appt 05-11-22 .
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
## 2022-04-12 NOTE — Consult Note (Signed)
Telepsych Consultation   Reason for Consult:  Suicidal ideation Referring Physician:  Cyndee Brightly, Soijett A, PA-C Location of Patient: [facility] Location of Provider: Other: [facility]  Patient Identification: Dominic [name] MRN:  [id number] Principal Diagnosis: Substance induced mood disorder (HCC) Diagnosis:  Principal Problem:   Substance induced mood disorder (HCC) Active Problems:   Malingering   Schizoaffective disorder, depressive type (HCC)   Polysubstance abuse (HCC)   Total Time spent with patient: 45 minutes  Subjective:   Dominic [name] is a 46 y.o. male patient admitted to [facility] ED after presenting with complaints of suicidal ideation.  Patient was just discharged from [facility] prior to his admission to [facility] ED.    Patient is needing a place to stay related to not having money to pay at this time.  He has had multiple ED/UC visits at least 14 to 15 ED and (urgent care) UC visits in [facility] system since 01/27/2022 this is not including 5/6 [facility], 5/14 [facility], 5/18 to 5/23 admission [facility], [facility]23 [facility], [facility], [facility] x 2.  It has been noted that patient presents with complaints of suicidal ideation secondary gain to have a place to stay.  He has even admitted that he is needing a place to stay and that has made suicidal/homicidal complaints so that he is allowed to stay in hospital.  He has been referred to multiple substance abuse rehab and community services that he has not followed up with.  He has been restarted on medication multiple time with no compliance or follow up to continue medications.       HPI:  Dominic [name], 46 y.o., male patient seen via tele health by this provider for the second time in less than 24 hours. Psychiatric assessment discussed with Dr. Nelly Rout; and chart reviewed on 04/12/22.  On evaluation Dominic [name] is sitting on side of bed in a pleasant mood stating that he is feeling good this morning "They gave me my medicine and I'm feeling good.  I feel  like myself."  (Patient has had one dose of Abilify 10 mg and Celexa 20 mg that was given this morning.  He is scheduled to get Abilify Maintena 400 mg prior to discharge).  Patient states that he has spoken to landlord and "He is going to let me sleep at house until I get my money and then he is going to let me move to a boarding house tomorrow.  I told him I wanted a room with my own shower and bath.  I'm ready to go.  Today is my mom's birthday.  I won't get to see her though cause she is another county."  Patient denies suicidal/self-harm/homicidal ideation, psychosis, and paranoia."   During evaluation Dominic [name] is sitting on side of bed in pleasant , euthymic mood with congruent affect.  There is no noted distress.  He is alert and oriented x 4.  He is speaking in a clear tone at moderate volume, and normal pace; with good eye contact.  His thought process is coherent and relevant; There is no indication that he is currently responding to internal/external stimuli or experiencing delusional thought content; and he denies suicidal/self-harm/homicidal ideation, psychosis, and paranoia.  Discussed medication regimen with patient and also informed of his follow up at [facility] understanding voiced and he agreed to keep follow up appointment.  Also informed that he was again given resources for community services and substance use services.  Past Psychiatric History: See below  Risk to Self:  Denies Risk to Others:  Denies Prior Inpatient Therapy:  Yes Prior Outpatient Therapy:  Yes  Past Medical History:  Past Medical History:  Diagnosis Date   Anxiety    Asthma    Bipolar 1 disorder (HCC)    Chronic hepatitis C without hepatic coma (HCC) 05/28/2018   Depression    Hepatitis C    Hepatitis C antibody positive in blood 05/28/2018   Malingering 03/30/2022   Psychiatry note from [facility] states "directly admitted to fabricating SI and CAH to obtain [psychiatric] admission"   Schizophrenia  (HCC)    Sleep apnea     Past Surgical History:  Procedure Laterality Date   CYST REMOVAL NECK     neck   Family History:  Family History  Problem Relation Age of Onset   Hypertension Sister    Schizophrenia Sister    Family Psychiatric  History: None reported Social History:  Social History   Substance and Sexual Activity  Alcohol Use Yes   Alcohol/week: 3.0 standard drinks   Types: 3 Shots of liquor per week     Social History   Substance and Sexual Activity  Drug Use Yes   Types: Cocaine, Marijuana    Social History   Socioeconomic History   Marital status: Single    Spouse name: Not on file   Number of children: Not on file   Years of education: Not on file   Highest education level: Not on file  Occupational History   Not on file  Tobacco Use   Smoking status: Some Days    Packs/day: 1.00    Types: Cigarettes   Smokeless tobacco: Never  Vaping Use   Vaping Use: Never used  Substance and Sexual Activity   Alcohol use: Yes    Alcohol/week: 3.0 standard drinks    Types: 3 Shots of liquor per week   Drug use: Yes    Types: Cocaine, Marijuana   Sexual activity: Not Currently  Other Topics Concern   Not on file  Social History Narrative   Not on file   Social Determinants of Health   Financial Resource Strain: Not on file  Food Insecurity: Not on file  Transportation Needs: Not on file  Physical Activity: Not on file  Stress: Not on file  Social Connections: Not on file   Additional Social History:    Allergies:  No Known Allergies  Labs:  Results for orders placed or performed during the hospital encounter of 04/11/22 (from the past 48 hour(s))  Comprehensive metabolic panel     Status: Abnormal   Collection Time: 04/11/22  8:49 PM  Result Value Ref Range   Sodium 137 135 - 145 mmol/L   Potassium 4.0 3.5 - 5.1 mmol/L   Chloride 105 98 - 111 mmol/L   CO2 25 22 - 32 mmol/L   Glucose, Bld 83 70 - 99 mg/dL    Comment: Glucose reference  range applies only to samples taken after fasting for at least 8 hours.   BUN 7 6 - 20 mg/dL   Creatinine, Ser 1.61 0.61 - 1.24 mg/dL   Calcium 8.8 (L) 8.9 - 10.3 mg/dL   Total Protein 7.2 6.5 - 8.1 g/dL   Albumin 3.8 3.5 - 5.0 g/dL   AST 41 15 - 41 U/L   ALT 48 (H) 0 - 44 U/L   Alkaline Phosphatase 34 (L) 38 - 126 U/L   Total Bilirubin 0.7 0.3 -  1.2 mg/dL   GFR, Estimated >16>60 >10>60 mL/min    Comment: (NOTE) Calculated using the CKD-EPI Creatinine Equation (2021)    Anion gap 7 5 - 15    Comment: Performed at [facility], [address] [place], [place] [address]  Ethanol     Status: None   Collection Time: 04/11/22  8:49 PM  Result Value Ref Range   Alcohol, Ethyl (B) <10 <10 mg/dL    Comment: (NOTE) Lowest detectable limit for serum alcohol is 10 mg/dL.  For medical purposes only. Performed at [facility], [address] [place], [place] [address]   Salicylate level     Status: Abnormal   Collection Time: 04/11/22  8:49 PM  Result Value Ref Range   Salicylate Lvl <7.0 (L) 7.0 - 30.0 mg/dL    Comment: Performed at [facility], [address] [place], [place] [address]  Acetaminophen level     Status: Abnormal   Collection Time: 04/11/22  8:49 PM  Result Value Ref Range   Acetaminophen (Tylenol), Serum <10 (L) 10 - 30 ug/mL    Comment: (NOTE) Therapeutic concentrations vary significantly. A range of 10-30 ug/mL  may be an effective concentration for many patients. However, some  are best treated at concentrations outside of this range. Acetaminophen concentrations >150 ug/mL at 4 hours after ingestion  and >50 ug/mL at 12 hours after ingestion are often associated with  toxic reactions.  Performed at [facility], [address] [place], [place] [address]   cbc     Status: Abnormal   Collection Time: 04/11/22  8:49 PM  Result Value Ref Range   WBC 9.1 4.0 - 10.5 K/uL   RBC 4.63 4.22 - 5.81 MIL/uL   Hemoglobin 13.8 13.0 - 17.0 g/dL    HCT 21.337.7 (L) 08.639.0 - 52.0 %   MCV 81.4 80.0 - 100.0 fL   MCH 29.8 26.0 - 34.0 pg   MCHC 36.6 (H) 30.0 - 36.0 g/dL   RDW 57.814.6 46.911.5 - 62.915.5 %   Platelets 213 150 - 400 K/uL   nRBC 0.0 0.0 - 0.2 %    Comment: Performed at [facility], [address] [place], [place] [address]  Rapid urine drug screen (hospital performed)     Status: Abnormal   Collection Time: 04/11/22  9:19 PM  Result Value Ref Range   Opiates NONE DETECTED NONE DETECTED   Cocaine POSITIVE (A) NONE DETECTED   Benzodiazepines POSITIVE (A) NONE DETECTED   Amphetamines NONE DETECTED NONE DETECTED   Tetrahydrocannabinol POSITIVE (A) NONE DETECTED   Barbiturates NONE DETECTED NONE DETECTED    Comment: (NOTE) DRUG SCREEN FOR MEDICAL PURPOSES ONLY.  IF CONFIRMATION IS NEEDED FOR ANY PURPOSE, NOTIFY LAB WITHIN 5 DAYS.  LOWEST DETECTABLE LIMITS FOR URINE DRUG SCREEN Drug Class                     Cutoff (ng/mL) Amphetamine and metabolites    1000 Barbiturate and metabolites    200 Benzodiazepine                 200 Tricyclics and metabolites     300 Opiates and metabolites        300 Cocaine and metabolites        300 THC  50 Performed at [facility], [address], [place], [place] [address]     Medications:  Current Facility-Administered Medications  Medication Dose Route Frequency Provider Last Rate Last Admin   acetaminophen (TYLENOL) tablet 650 mg  650 mg Oral Q4H PRN Blue, Soijett A, PA-C       ARIPiprazole (ABILIFY) tablet 10 mg  10 mg Oral Daily Anabelen Kaminsky B, NP   10 mg at 04/12/22 0926   ARIPiprazole ER (ABILIFY MAINTENA) injection 400 mg  400 mg Intramuscular Q28 days Romeo Zielinski B, NP       citalopram (CELEXA) tablet 20 mg  20 mg Oral Daily Kendal Raffo B, NP   20 mg at 04/12/22 0926   ondansetron (ZOFRAN) tablet 4 mg  4 mg Oral Q8H PRN Blue, Soijett A, PA-C       traZODone (DESYREL) tablet 50 mg  50 mg Oral QHS PRN Tatym Schermer B, NP       Current  Outpatient Medications  Medication Sig Dispense Refill   acetaminophen (TYLENOL) 500 MG tablet Take 1,000 mg by mouth every 6 (six) hours as needed for moderate pain or headache.     ARIPiprazole (ABILIFY) 10 MG tablet Take 1 tablet (10 mg total) by mouth daily for 14 days. 14 tablet 0   ARIPiprazole ER (ABILIFY MAINTENA) 400 MG SRER injection Inject 2 mLs (400 mg total) into the muscle every 28 (twenty-eight) days. Next dose due May 11, 2022.  Follow up at [facility] shot clinic at 11:15 am.  Bring medication for injection with you 1 each 1   citalopram (CELEXA) 20 MG tablet Take 1 tablet (20 mg total) by mouth daily. 30 tablet 0   traZODone (DESYREL) 50 MG tablet Take 1 tablet (50 mg total) by mouth at bedtime as needed for sleep. (Patient not taking: Reported on 04/10/2022) 30 tablet 0    Musculoskeletal: Strength & Muscle Tone: within normal limits Gait & Station: normal Patient leans: N/A   Psychiatric Specialty Exam:  Presentation  General Appearance: Appropriate for Environment  Eye Contact:Good  Speech:Clear and Coherent; Normal Rate  Speech Volume:Normal  Handedness:Right   Mood and Affect  Mood:Euthymic  Affect:Appropriate; Congruent   Thought Process  Thought Processes:Coherent; Goal Directed  Descriptions of Associations:Intact  Orientation:Full (Time, Place and Person)  Thought Content:Logical  History of Schizophrenia/Schizoaffective disorder:Yes  Duration of Psychotic Symptoms:Greater than six months  Hallucinations:Hallucinations: None Description of Auditory Hallucinations: States he is having no auditory of visual hallucinations  Ideas of Reference:None  Suicidal Thoughts:Suicidal Thoughts: No  Homicidal Thoughts:Homicidal Thoughts: No   Sensorium  Memory:Immediate Good; Recent Good; Remote Fair  Judgment:Intact  Insight:Present   Executive Functions  Concentration:Fair  Attention  Span:Good  Recall:Good  Fund of Knowledge:Good  Language:Good   Psychomotor Activity  Psychomotor Activity:Psychomotor Activity: Normal   Assets  Assets:Communication Skills; Desire for Improvement; Financial Resources/Insurance   Sleep  Sleep:Sleep: Good    Physical Exam: Physical Exam ROS Blood pressure (!) 130/50, pulse 75, temperature 97.8 F (36.6 C), temperature source Oral, resp. rate 20, SpO2 96 %. There is no height or weight on file to calculate BMI.   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  Malingering when he has admitted to giving false statements of suicidal and auditory hallucinations to gain admission to hospital for secondary gain.    Malingering: This patient has a significant history of malingering evident by multiple emergency room (ED), urgent care (UC) visits, and  psychiatric hospitalizations with similar complaints of suicidal ideation.  Patient has been observed overnight in the ED and UC and there has been no unsafe behavior observed.  Patients' similar complaint of suicidal ideation is often resolved once housing or other need has been arranged for him.  While in the ED or UC he has demonstrated no unsafe behavior and there is no psychiatric condition which is amendable to inpatient psychiatric hospitalization at this time.  Patients' suicidal ideation is conditional and used as a means of manipulation and/or attention seeking without true intention to harm himself.  His behaviors are more consistent with someone who is acting in self-preservation, rather than someone who is acutely suicidal.  Given all that is stated above including the fact that the patient shows no signs of mania or psychosis, has a liner, coherent thought process throughout assessment the patient does not meet criteria for inpatient psychiatric hospitalization or further psychiatric evaluation at this time.  Patient would benefit from  outpatient psychiatric services which resources will be provided along with community resources prior to discharge.    Patients' ongoing endorsement of suicidal ideation shows clear evidence of secondary gain of unmet needs that is representative of limited and often- maladaptive coping skills and rather than an indicator of imminent risk of death.  Evidence indicates that subsequent suicide attempts by patients who made contingent suicide threats (defined as threatened suicide or exaggerated suicidality) are uncommon in both groups.  Hospitalization should not be used as a substitute for social services, substance abuse treatment, and legal assistance for patients who make contingent suicide threats (Characteristics and six-month outcome of patients who use suicide threats to seek hospital admission.  (1966). Psychiatric Services, 47(8), [phone]. (DOI: 10.1176/ps.47.8.871).     Treatment Plan Summary: Plan Psychiatrically clear to follow up with outpatient psychiatric services.  Resources and follow up information will be given   Follow-up Information     [facility]. Go on 05/11/2022.   Specialty: Urgent Care Why: Keep scheduled appointment for 05/11/22 at 11:15 am for intake medication management and next Abilify Maintena injection.  If you need to be seen earlier go during walk in hours listed below in discharge instructions Contact information: [phone] [place] [phone]                Discharge Instructions         Walk in hours for medication management Monday, Wednesday, Thursday, and Friday from 8:00 AM to 11:00 AM Recommend arriving by by 7:30 AM.  It is first come first serve.    Walk in hours for therapy intake Monday and Wednesday only 8:00 AM to 11:00 AM Encouraged to arrive by 7:30 AM.  It is first come first serve   Shot Clinic Call to schedule an appointment.  Shot clinic is offered on Tuesday and  Thursday.  If you don't have an appointment will need to call prior to coming to make sure there is an opening to be seen and injection given Follow up as instructed by behavior health     Disposition: No evidence of imminent risk to self or others at present.   Patient does not meet criteria for psychiatric inpatient admission. Supportive therapy provided about ongoing stressors. Discussed crisis plan, support from social network, calling 911, coming to the Emergency Department, and calling Suicide Hotline.  This service was provided via telemedicine using a 2-way, interactive audio and video technology.  Names of all persons participating in  this telemedicine service and their role in this encounter. Name: Assunta Found Role: NP  Name: Dominic [name] Role: Patient  Name:  Role:   Name:  Role:    Secure message sent to patients nurse informing the following:  Psychiatric consult completed and patient is psychiatrically cleared.  He will need Abilify Maintena 400 mg injection given prior to discharge.  He is to follow at [facility] shot clinic for next Abilify Maintena injection scheduled for May 11, 2022, at 11:15 am (it has been added to AVS).  He will need to continue PO Abilify for 14 days.  Resources have been added to AVS also.  Please inform MD only default listed  Beldon Nowling, NP 04/12/2022 10:34 AM

## 2022-04-12 NOTE — Discharge Instructions (Addendum)
    Walk in hours for medication management Monday, Wednesday, Thursday, and Friday from 8:00 AM to 11:00 AM Recommend arriving by by 7:30 AM.  It is first come first serve.    Walk in hours for therapy intake Monday and Wednesday only 8:00 AM to 11:00 AM Encouraged to arrive by 7:30 AM.  It is first come first serve   Shot Clinic Call to schedule an appointment.  Shot clinic is offered on Tuesday and Thursday.  If you don't have an appointment will need to call prior to coming to make sure there is an opening to be seen and injection given Follow up as instructed by behavior health

## 2022-04-12 NOTE — ED Notes (Signed)
Pt is currently sleeping well. Will get full set of vs when pt awakes. Resp are even and unlabored at present rr 17. Skin WNL. Zero noted distress.

## 2022-04-14 ENCOUNTER — Emergency Department (HOSPITAL_COMMUNITY): Payer: Medicaid Other

## 2022-04-14 ENCOUNTER — Other Ambulatory Visit: Payer: Self-pay

## 2022-04-14 ENCOUNTER — Emergency Department (HOSPITAL_COMMUNITY)
Admission: EM | Admit: 2022-04-14 | Discharge: 2022-04-14 | Disposition: A | Discharge status: Home / Self Care | Payer: Medicaid Other | Attending: Emergency Medicine | Admitting: Emergency Medicine

## 2022-04-14 DIAGNOSIS — F411 Generalized anxiety disorder: Secondary | ICD-10-CM | POA: Diagnosis present

## 2022-04-14 DIAGNOSIS — F17210 Nicotine dependence, cigarettes, uncomplicated: Secondary | ICD-10-CM | POA: Insufficient documentation

## 2022-04-14 DIAGNOSIS — F1910 Other psychoactive substance abuse, uncomplicated: Secondary | ICD-10-CM

## 2022-04-14 DIAGNOSIS — Z20822 Contact with and (suspected) exposure to covid-19: Secondary | ICD-10-CM | POA: Insufficient documentation

## 2022-04-14 DIAGNOSIS — F4323 Adjustment disorder with mixed anxiety and depressed mood: Secondary | ICD-10-CM | POA: Diagnosis present

## 2022-04-14 DIAGNOSIS — F419 Anxiety disorder, unspecified: Secondary | ICD-10-CM | POA: Diagnosis not present

## 2022-04-14 DIAGNOSIS — D72829 Elevated white blood cell count, unspecified: Secondary | ICD-10-CM | POA: Diagnosis not present

## 2022-04-14 LAB — HEPATIC FUNCTION PANEL
ALT: 40 U/L (ref 0–44)
AST: 36 U/L (ref 15–41)
Albumin: 4.4 g/dL (ref 3.5–5.0)
Alkaline Phosphatase: 39 U/L (ref 38–126)
Bilirubin, Direct: 0.3 mg/dL — ABNORMAL HIGH (ref 0.0–0.2)
Indirect Bilirubin: 1.4 mg/dL — ABNORMAL HIGH (ref 0.3–0.9)
Total Bilirubin: 1.7 mg/dL — ABNORMAL HIGH (ref 0.3–1.2)
Total Protein: 8.8 g/dL — ABNORMAL HIGH (ref 6.5–8.1)

## 2022-04-14 LAB — TROPONIN I (HIGH SENSITIVITY)
Troponin I (High Sensitivity): 5 ng/L (ref <18)
Troponin I (High Sensitivity): 4 ng/L (ref <18)

## 2022-04-14 LAB — CBC
HCT: 39.7 % (ref 39.0–52.0)
Hemoglobin: 14.2 g/dL (ref 13.0–17.0)
MCH: 29.3 pg (ref 26.0–34.0)
MCHC: 35.8 g/dL (ref 30.0–36.0)
MCV: 81.9 fL (ref 80.0–100.0)
Platelets: 235 10*3/uL (ref 150–400)
RBC: 4.85 MIL/uL (ref 4.22–5.81)
RDW: 14.6 % (ref 11.5–15.5)
WBC: 18.3 10*3/uL — ABNORMAL HIGH (ref 4.0–10.5)
nRBC: 0 % (ref 0.0–0.2)

## 2022-04-14 LAB — RAPID URINE DRUG SCREEN, HOSP PERFORMED
Amphetamines: POSITIVE — AB
Barbiturates: NOT DETECTED
Benzodiazepines: POSITIVE — AB
Cocaine: POSITIVE — AB
Opiates: NOT DETECTED
Tetrahydrocannabinol: POSITIVE — AB

## 2022-04-14 LAB — CK: Total CK: 344 U/L (ref 49–397)

## 2022-04-14 LAB — BASIC METABOLIC PANEL
Anion gap: 7 (ref 5–15)
BUN: 15 mg/dL (ref 6–20)
CO2: 25 mmol/L (ref 22–32)
Calcium: 9.3 mg/dL (ref 8.9–10.3)
Chloride: 107 mmol/L (ref 98–111)
Creatinine, Ser: 0.8 mg/dL (ref 0.61–1.24)
GFR, Estimated: >60 mL/min (ref >60)
Glucose, Bld: 100 mg/dL — ABNORMAL HIGH (ref 70–99)
Potassium: 4.3 mmol/L (ref 3.5–5.1)
Sodium: 139 mmol/L (ref 135–145)

## 2022-04-14 LAB — RESP PANEL BY RT-PCR (FLU A&B, COVID) ARPGX2
Influenza A by PCR: NEGATIVE
Influenza B by PCR: NEGATIVE
SARS Coronavirus 2 by RT PCR: NEGATIVE

## 2022-04-14 LAB — ETHANOL: Alcohol, Ethyl (B): <10 mg/dL (ref <10)

## 2022-04-14 LAB — SALICYLATE LEVEL: Salicylate Lvl: <7 mg/dL — ABNORMAL LOW (ref 7.0–30.0)

## 2022-04-14 LAB — ACETAMINOPHEN LEVEL: Acetaminophen (Tylenol), Serum: <10 ug/mL — ABNORMAL LOW (ref 10–30)

## 2022-04-14 NOTE — ED Notes (Signed)
Pt ambulatory without assistance.  

## 2022-04-14 NOTE — ED Provider Notes (Signed)
[facility]-EMERGENCY DEPT Provider Note   CSN: [id number] Arrival date & time: 04/14/22  2409     History {Add pertinent medical, surgical, social history, OB history to HPI:1} Chief Complaint  Patient presents with   Drug Problem   Suicidal    Dominic [name] is a 46 y.o. male with a past medical history of homelessness, polysubstance abuse, schizophrenia who presents emergency department with chief complaint of chest pain, leg pain and suicidal ideation after using methamphetamine and fentanyl an hour prior to arrival.  Patient was seen for SI 2 days ago and cleared.  Patient denies shortness of breath or loss of consciousness.  He has no other complaints at this time   Drug Problem      Home Medications Prior to Admission medications   Medication Sig Start Date End Date Taking? Authorizing Provider  ARIPiprazole (ABILIFY) 10 MG tablet Take 1 tablet (10 mg total) by mouth daily for 14 days. 04/12/22 04/26/22 Yes Rankin, Shuvon B, NP  ARIPiprazole ER (ABILIFY MAINTENA) 400 MG SRER injection Inject 2 mLs (400 mg total) into the muscle every 28 (twenty-eight) days. Next dose due May 11, 2022.  Follow up at [facility] shot clinic at 11:15 am.  Bring medication for injection with you Patient not taking: Reported on 04/14/2022 04/12/22   Rankin, Shuvon B, NP  citalopram (CELEXA) 20 MG tablet Take 1 tablet (20 mg total) by mouth daily. Patient not taking: Reported on 04/14/2022 04/12/22   Rankin, Shuvon B, NP  traZODone (DESYREL) 50 MG tablet Take 1 tablet (50 mg total) by mouth at bedtime as needed for sleep. Patient not taking: Reported on 04/10/2022 02/17/22   Layla Barter, NP      Allergies    Patient has no known allergies.    Review of Systems   Review of Systems  Physical Exam Updated Vital Signs BP 133/90 (BP Location: Right Arm)   Pulse 94   Temp 98 F (36.7 C) (Oral)   Resp (!) 30   Ht 5\' 11"  (1.803 m)   Wt 88.5 kg    SpO2 97%   BMI 27.20 kg/m  Physical Exam Vitals and nursing note reviewed.  Constitutional:      General: He is not in acute distress.    Appearance: He is well-developed. He is not diaphoretic.  HENT:     Head: Normocephalic and atraumatic.  Eyes:     General: No scleral icterus.    Conjunctiva/sclera: Conjunctivae normal.  Cardiovascular:     Rate and Rhythm: Normal rate and regular rhythm.     Heart sounds: Normal heart sounds.  Pulmonary:     Effort: Pulmonary effort is normal. No respiratory distress.     Breath sounds: Normal breath sounds.  Abdominal:     Palpations: Abdomen is soft.     Tenderness: There is no abdominal tenderness.  Musculoskeletal:     Cervical back: Normal range of motion and neck supple.  Skin:    General: Skin is warm and dry.  Neurological:     Mental Status: He is alert.  Psychiatric:        Behavior: Behavior normal.    ED Results / Procedures / Treatments   Labs (all labs ordered are listed, but only abnormal results are displayed) Labs Reviewed - No data to display  EKG None  Radiology No results found.  Procedures Procedures  {Document cardiac monitor, telemetry assessment procedure when appropriate:1}  Medications Ordered in  ED Medications - No data to display  ED Course/ Medical Decision Making/ A&P                           Medical Decision Making  ***  {Document critical care time when appropriate:1} {Document review of labs and clinical decision tools ie heart score, Chads2Vasc2 etc:1}  {Document your independent review of radiology images, and any outside records:1} {Document your discussion with family members, caretakers, and with consultants:1} {Document social determinants of health affecting pt's care:1} {Document your decision making why or why not admission, treatments were needed:1} Final Clinical Impression(s) / ED Diagnoses Final diagnoses:  None    Rx / DC Orders ED Discharge Orders     None

## 2022-04-14 NOTE — ED Provider Notes (Signed)
Care handoff received from Arthor Captain, PA-C at shift change please see previous provider note for full details of visit.  In short patient arrived for chest and leg pain along with suicidal ideations after using fentanyl and methamphetamine earlier tonight.  Labs are currently pending.  Of note patient was seen by TTS 2 days ago and cleared.  Patient without plan for suicide. Physical Exam  BP 135/85   Pulse 80   Temp 98 F (36.7 C) (Oral)   Resp 15   Ht 5\' 11"  (1.803 m)   Wt 88.5 kg   SpO2 95%   BMI 27.20 kg/m   Physical Exam Constitutional:      General: He is not in acute distress.    Appearance: Normal appearance. He is well-developed. He is not ill-appearing or diaphoretic.  HENT:     Head: Normocephalic and atraumatic.  Eyes:     General: Vision grossly intact. Gaze aligned appropriately.     Pupils: Pupils are equal, round, and reactive to light.  Neck:     Trachea: Trachea and phonation normal.  Pulmonary:     Effort: Pulmonary effort is normal. No respiratory distress.  Abdominal:     General: There is no distension.     Palpations: Abdomen is soft.     Tenderness: There is no abdominal tenderness. There is no guarding or rebound.  Musculoskeletal:        General: Normal range of motion.     Cervical back: Normal range of motion.  Skin:    General: Skin is warm and dry.  Neurological:     Mental Status: He is alert.     GCS: GCS eye subscore is 4. GCS verbal subscore is 5. GCS motor subscore is 6.     Comments: Speech is clear and goal oriented, follows commands Major Cranial nerves without deficit, no facial droop Moves extremities without ataxia, coordination intact  Psychiatric:        Behavior: Behavior normal.    Procedures  Procedures  ED Course / MDM   Clinical Course as of 04/14/22 1108  Fri Apr 14, 2022  0837 Basic metabolic panel(!) BMP without emergent electrolyte derangement, AKI or gap. [BM]  0838 CBC(!)  CBC shows leukocytosis of 18.3  suspect secondary to substance abuse.  No anemia or thrombocytopenia. [BM]  0838 CK  CK within normal limits, doubt rhabdo. [BM]  0838 Troponin I (High Sensitivity) Initial and delta troponin within normal limits, low suspicion for ACS. [BM]  [phone] DG Chest [facility] I personally viewed patient's chest x-ray, I do not appreciate any obvious PTX or effusion.  I agree with radiology and there is a possible opacity at the will obtain two-view chest x-ray to further evaluate. [BM]  1005 DG Chest 2 View I personally reviewed interpreted the follow-up two-view chest x-ray.  I do not appreciate any obvious PTX, effusion or infiltrates.  I agree with radiology interpretation. [BM]  1105 Acetaminophen level(!) No evidence for acetaminophen overdose [BM]  1106 Salicylate level(!)  No evidence of salicylate overdose [BM]  1106 Urine rapid drug screen (hosp performed)(!) UDS positive for cocaine, benzodiazepines, amphetamines and THC [BM]  1106 Hepatic function panel(!) LFTs have shown elevation of the total bilirubin and direct bilirubin appears similar to prior.  Patient without abdominal pain or nausea/vomiting to suggest biliary obstruction. [BM]    Clinical Course User Index [BM] [facility], PA-C   Medical Decision Making Amount and/or Complexity of Data Reviewed Labs: ordered.  Decision-making details documented in ED Course. Radiology: ordered. Decision-making details documented in ED Course.  Patient reassessed he is walking around the room talking on the phone no acute distress.  He denies any active chest pain or additional complaints at this time.  Cardiac work-up overall reassuring.  Suspect patient's earlier pain may be secondary to his polysubstance abuse.  Low suspicion for ACS, PE, dissection or other emergent cardiopulmonary causes of his symptoms. Labs did show a leukocytosis of 18.3 but I suspect this is secondary to his polysubstance abuse as well, suspicion for infectious  etiology at this point.   He does report passive suicidal ideations and would like to talk to psychiatry about this today.  TTS consult placed.  Remainder of medical clearance labs ordered. ------------------------- Remainder of medical clearance labs showed no emergent abnormalities.  Patient reassessed he is resting comfortably no acute distress.  Vital signs stable on room air.  At this time there does not appear to be any evidence of an acute emergency medical condition and the patient appears stable psychiatric disposition.  Patient's case discussed with Dr. Anitra Lauth who agrees with plan.  Note: Portions of this report may have been transcribed using voice recognition software. Every effort was made to ensure accuracy; however, inadvertent computerized transcription errors may still be present.        Elizabeth Palau 04/14/22 1108    Gwyneth Sprout, MD 04/20/22 1654

## 2022-04-14 NOTE — ED Provider Notes (Signed)
Patient cleared by psychiatry for discharge    Lorre Nick, MD 04/14/22 1909

## 2022-04-14 NOTE — Discharge Instructions (Addendum)
Behavioral Health Crisis Line: [phone]  Shelter List:   [facility] [place]) [address] [place], [place] Phone: [phone]   [facility] has been providing emergency shelter to those in need of a permanent residence for over 35 years. The [facility] shelter plays an important role in our community.   There are many life events that can pull someone into a downward spiral towards poverty that is very difficult to get out of. Homelessness is a problem that can affect anyone of [place]. [facility] is a safe and comforting place to stay, especially if you have experienced the hardship of street life.   [facility] provides a single bed and bedding to 100 adult men and women. The shelter welcomes all who are in need of housing, no one in real need is turned away unless space is not available.   While staying at [facility], guests are offered more than just a bed for a night. Hot meals are provided and every guest has access to case management services. Case managers provide assistance with finding housing, employment, or other services that will help them gain stability. Continuous stay is based on availability, capacity, and progress towards goals.   To contact the front desk of [facility] please call   [phone] ext [phone] or ext. [phone].   [facility] [address], [place], [place] [address] Phone: [phone]   [facility] [address], [place] [address] Phone: [phone]   [facility]: [phone]. [address] [place], [place] [address] Phone: [phone]   [facility] [address], [place], [place] [address] Check in at 6:00PM for placement at a local shelter) Phone: [phone]     [facility]          -(Please Call) Phone: [phone]   [facility] Eligibility:   Must be drug and alcohol free for at least 14 days or more at the time of application. This program serves males.  Houses Engineer, civil (consulting), Economist who serve six-month terms.  Houses are financially self-supporting; members split house expenses, which average $90.00 to $130.00 per person per week.  Any Resident who relapses must be immediately expelled. Call:  [phone]   [facility]-will provide timely access to mental health services for children and adolescents (4-17) and adults presenting in a mental health crisis. The program is designed for those who need urgent Behavioral Health or Substance Use treatment and are not experiencing a medical crisis that would typically require an emergency room visit.   [address] [place], [place] [address] Phone: [phone] [email or web address]   [facility] will also offer the following outpatient services: (Monday through Friday 8am-5pm)   Partial Hospitalization Program (PHP) Substance Abuse Intensive Outpatient Program (SA-IOP) Group Therapy Medication Management Peer Living Room   We also provide (24/7):   Assessments: Our mental health clinician and providers will conduct a focused mental health evaluation, assessing for immediate safety concerns and further mental health needs.   Referral: Our team will provide resources and help connect to community based mental health treatment, when indicated, including psychotherapy, psychiatry, and other specialized behavioral health or substance use disorder services (for those not already in treatment).   Transitional Care: Our team providers in person bridging and/or telphonic follow-up during the patient's transition to outpatient services. Address: [address] [place], [place], [place]  [address] Hours:  Closed ? Opens 9?AM Mon Phone: [phone]

## 2022-04-14 NOTE — Progress Notes (Signed)
CSW added resources to pt's AVS.  Maryjean Ka, MSW, [facility] 04/14/2022 8:31 PM

## 2022-04-14 NOTE — Consult Note (Cosign Needed Addendum)
Telepsych Consultation   Reason for Consult:  SI Referring Physician:  Elizabeth Palau Location of Patient:  [place] [id number] Location of Provider: Behavioral Health TTS Department  Patient Identification: Dominic [name] MRN:  [id number] Principal Diagnosis: Adjustment disorder with mixed anxiety and depressed mood Diagnosis:  Principal Problem:   Adjustment disorder with mixed anxiety and depressed mood Active Problems:   Anxiety state   Total Time spent with patient: 20 minutes  Subjective:   Dominic [name] is a 46 y.o. male patient admitted with suicidal ideations.  Said he smoked "ice, crack cocaine. Did some heroin and fentanyl".  Patient then stated he wanted to go back to [facility] for detox. Says he is from [facility], "but I can't go back there". Says he has been homeless since earlier this year when he and his child's mother broke up. x3 ED encounters 04/11/22 with similar presentations at different locations; pt discharged with resources. Reports being registered with [facility] and familiar with area services. No ACTT Team or wrap around services noted. Denies any active suicidal or homicidal ideations, auditory or visual hallucinations. No active withdrawal signs or symptoms noted. Secondary gain cannot be ruled out given patient history and number of emergency encounters. Patient is being psych cleared;TOC consult placed to address patient's psychosocial needs, possible [facility] referral.   HPI:  [name] is a 46 year old male patient with past psychiatric history of  schizophrenia, schizoaffective disorder, polysubstance abuse, malingering who was admitted via EMS from street wanting to seek detox from heroin and fentanyl with suicidal ideations, reports last use one hour prior. Per chart review, patient has 11 ED encounters within past 10 days spanning several counties including [place], [place], [place], [place]; multiple encounters within 24 hour  period at times. Patient is homeless. Per chart review patient received Abilify Maintenna 400 mg at [facility]; next dose due 05/11/22.   Past Psychiatric History: schizophrenia, schizoaffective disorder, polysubstance abuse  Risk to Self:  pt denies Risk to Others:  pt denies Prior Inpatient Therapy:  yes Prior Outpatient Therapy:  yes  Past Medical History:  Past Medical History:  Diagnosis Date   Anxiety    Asthma    Bipolar 1 disorder (HCC)    Chronic hepatitis C without hepatic coma (HCC) 05/28/2018   Depression    Hepatitis C    Hepatitis C antibody positive in blood 05/28/2018   Malingering 03/30/2022   Psychiatry note from [facility] states "directly admitted to fabricating SI and CAH to obtain [psychiatric] admission"   Schizophrenia (HCC)    Sleep apnea     Past Surgical History:  Procedure Laterality Date   CYST REMOVAL NECK     neck   Family History:  Family History  Problem Relation Age of Onset   Hypertension Sister    Schizophrenia Sister    Family Psychiatric  History: not noted Social History:  Social History   Substance and Sexual Activity  Alcohol Use Yes   Alcohol/week: 3.0 standard drinks   Types: 3 Shots of liquor per week     Social History   Substance and Sexual Activity  Drug Use Yes   Types: Cocaine, Marijuana    Social History   Socioeconomic History   Marital status: Single    Spouse name: Not on file   Number of children: Not on file   Years of education: Not on file   Highest education level: Not on file  Occupational History   Not  on file  Tobacco Use   Smoking status: Some Days    Packs/day: 1.00    Types: Cigarettes   Smokeless tobacco: Never  Vaping Use   Vaping Use: Never used  Substance and Sexual Activity   Alcohol use: Yes    Alcohol/week: 3.0 standard drinks    Types: 3 Shots of liquor per week   Drug use: Yes    Types: Cocaine, Marijuana   Sexual activity: Not Currently  Other Topics Concern   Not on file   Social History Narrative   Not on file   Social Determinants of Health   Financial Resource Strain: Not on file  Food Insecurity: Not on file  Transportation Needs: Not on file  Physical Activity: Not on file  Stress: Not on file  Social Connections: Not on file   Additional Social History:    Allergies:  No Known Allergies  Labs:  Results for orders placed or performed during the hospital encounter of 04/14/22 (from the past 48 hour(s))  Basic metabolic panel     Status: Abnormal   Collection Time: 04/14/22  5:47 AM  Result Value Ref Range   Sodium 139 135 - 145 mmol/L   Potassium 4.3 3.5 - 5.1 mmol/L   Chloride 107 98 - 111 mmol/L   CO2 25 22 - 32 mmol/L   Glucose, Bld 100 (H) 70 - 99 mg/dL    Comment: Glucose reference range applies only to samples taken after fasting for at least 8 hours.   BUN 15 6 - 20 mg/dL   Creatinine, Ser 9.60 0.61 - 1.24 mg/dL   Calcium 9.3 8.9 - 45.4 mg/dL   GFR, Estimated >09 >81 mL/min    Comment: (NOTE) Calculated using the CKD-EPI Creatinine Equation (2021)    Anion gap 7 5 - 15    Comment: Performed at [facility], [address], [place], [place] [address]  CBC     Status: Abnormal   Collection Time: 04/14/22  5:47 AM  Result Value Ref Range   WBC 18.3 (H) 4.0 - 10.5 K/uL   RBC 4.85 4.22 - 5.81 MIL/uL   Hemoglobin 14.2 13.0 - 17.0 g/dL   HCT 82.9 56.2 - 13.0 %   MCV 81.9 80.0 - 100.0 fL   MCH 29.3 26.0 - 34.0 pg   MCHC 35.8 30.0 - 36.0 g/dL   RDW 86.5 78.4 - 69.6 %   Platelets 235 150 - 400 K/uL   nRBC 0.0 0.0 - 0.2 %    Comment: Performed at [facility], [address] [place], [place] [address]  Troponin I (High Sensitivity)     Status: None   Collection Time: 04/14/22  5:47 AM  Result Value Ref Range   Troponin I (High Sensitivity) 5 <18 ng/L    Comment: (NOTE) Elevated high sensitivity troponin I (hsTnI) values and significant  changes across serial measurements may suggest ACS  but many other  chronic and acute conditions are known to elevate hsTnI results.  Refer to the "Links" section for chest pain algorithms and additional  guidance. Performed at [facility], [address], [place], [place] [address]   CK     Status: None   Collection Time: 04/14/22  5:47 AM  Result Value Ref Range   Total CK 344 49 - 397 U/L    Comment: Performed at [facility], [address] [place], [place] [address]  Urine rapid drug screen (hosp performed)  Status: Abnormal   Collection Time: 04/14/22  5:47 AM  Result Value Ref Range   Opiates NONE DETECTED NONE DETECTED   Cocaine POSITIVE (A) NONE DETECTED   Benzodiazepines POSITIVE (A) NONE DETECTED   Amphetamines POSITIVE (A) NONE DETECTED   Tetrahydrocannabinol POSITIVE (A) NONE DETECTED   Barbiturates NONE DETECTED NONE DETECTED    Comment: (NOTE) DRUG SCREEN FOR MEDICAL PURPOSES ONLY.  IF CONFIRMATION IS NEEDED FOR ANY PURPOSE, NOTIFY LAB WITHIN 5 DAYS.  LOWEST DETECTABLE LIMITS FOR URINE DRUG SCREEN Drug Class                     Cutoff (ng/mL) Amphetamine and metabolites    1000 Barbiturate and metabolites    200 Benzodiazepine                 200 Tricyclics and metabolites     300 Opiates and metabolites        300 Cocaine and metabolites        300 THC                            50 Performed at [facility], [address], [place], [place] [address]   Troponin I (High Sensitivity)     Status: None   Collection Time: 04/14/22  7:43 AM  Result Value Ref Range   Troponin I (High Sensitivity) 4 <18 ng/L    Comment: (NOTE) Elevated high sensitivity troponin I (hsTnI) values and significant  changes across serial measurements may suggest ACS but many other  chronic and acute conditions are known to elevate hsTnI results.  Refer to the "Links" section for chest pain algorithms and additional  guidance. Performed at [facility],  [address] [place], [place] [address]   Resp Panel by RT-PCR (Flu A&B, Covid) Anterior Nasal Swab     Status: None   Collection Time: 04/14/22 10:11 AM   Specimen: Anterior Nasal Swab  Result Value Ref Range   SARS Coronavirus 2 by RT PCR NEGATIVE NEGATIVE    Comment: (NOTE) SARS-CoV-2 target nucleic acids are NOT DETECTED.  The SARS-CoV-2 RNA is generally detectable in upper respiratory specimens during the acute phase of infection. The lowest concentration of SARS-CoV-2 viral copies this assay can detect is 138 copies/mL. A negative result does not preclude SARS-Cov-2 infection and should not be used as the sole basis for treatment or other patient management decisions. A negative result may occur with  improper specimen collection/handling, submission of specimen other than nasopharyngeal swab, presence of viral mutation(s) within the areas targeted by this assay, and inadequate number of viral copies(<138 copies/mL). A negative result must be combined with clinical observations, patient history, and epidemiological information. The expected result is Negative.  Fact Sheet for Patients:  BloggerCourse.com  Fact Sheet for Healthcare Providers:  SeriousBroker.it  This test is no t yet approved or cleared by the [place] FDA and  has been authorized for detection and/or diagnosis of SARS-CoV-2 by FDA under an Emergency Use Authorization (EUA). This EUA will remain  in effect (meaning this test can be used) for the duration of the COVID-19 declaration under Section 564(b)(1) of the Act, 21 U.S.C.section 360bbb-3(b)(1), unless the authorization is terminated  or revoked sooner.       Influenza A by PCR NEGATIVE NEGATIVE   Influenza B by PCR NEGATIVE NEGATIVE    Comment: (NOTE) The Xpert Xpress SARS-CoV-2/FLU/RSV plus assay  is intended as an aid in the diagnosis of influenza from Nasopharyngeal swab specimens  and should not be used as a sole basis for treatment. Nasal washings and aspirates are unacceptable for Xpert Xpress SARS-CoV-2/FLU/RSV testing.  Fact Sheet for Patients: BloggerCourse.com  Fact Sheet for Healthcare Providers: SeriousBroker.it  This test is not yet approved or cleared by the [place] FDA and has been authorized for detection and/or diagnosis of SARS-CoV-2 by FDA under an Emergency Use Authorization (EUA). This EUA will remain in effect (meaning this test can be used) for the duration of the COVID-19 declaration under Section 564(b)(1) of the Act, 21 U.S.C. section 360bbb-3(b)(1), unless the authorization is terminated or revoked.  Performed at [facility], [address], [place], [place] [address]   Ethanol     Status: None   Collection Time: 04/14/22 10:12 AM  Result Value Ref Range   Alcohol, Ethyl (B) <10 <10 mg/dL    Comment: (NOTE) Lowest detectable limit for serum alcohol is 10 mg/dL.  For medical purposes only. Performed at [facility], [address], [place], [place] [address]   Salicylate level     Status: Abnormal   Collection Time: 04/14/22 10:12 AM  Result Value Ref Range   Salicylate Lvl <7.0 (L) 7.0 - 30.0 mg/dL    Comment: Performed at [facility], [address], [place], [place] [address]  Acetaminophen level     Status: Abnormal   Collection Time: 04/14/22 10:12 AM  Result Value Ref Range   Acetaminophen (Tylenol), Serum <10 (L) 10 - 30 ug/mL    Comment: (NOTE) Therapeutic concentrations vary significantly. A range of 10-30 ug/mL  may be an effective concentration for many patients. However, some  are best treated at concentrations outside of this range. Acetaminophen concentrations >150 ug/mL at 4 hours after ingestion  and >50 ug/mL at 12 hours after ingestion are often associated with  toxic reactions.  Performed at  [facility], [address] [place], [place] [address]   Hepatic function panel     Status: Abnormal   Collection Time: 04/14/22 10:12 AM  Result Value Ref Range   Total Protein 8.8 (H) 6.5 - 8.1 g/dL   Albumin 4.4 3.5 - 5.0 g/dL   AST 36 15 - 41 U/L   ALT 40 0 - 44 U/L   Alkaline Phosphatase 39 38 - 126 U/L   Total Bilirubin 1.7 (H) 0.3 - 1.2 mg/dL   Bilirubin, Direct 0.3 (H) 0.0 - 0.2 mg/dL   Indirect Bilirubin 1.4 (H) 0.3 - 0.9 mg/dL    Comment: Performed at [facility], [address], [place], [place] [address]    Medications:  No current facility-administered medications for this encounter.   Current Outpatient Medications  Medication Sig Dispense Refill   ARIPiprazole (ABILIFY) 10 MG tablet Take 1 tablet (10 mg total) by mouth daily for 14 days. 14 tablet 0   ARIPiprazole ER (ABILIFY MAINTENA) 400 MG SRER injection Inject 2 mLs (400 mg total) into the muscle every 28 (twenty-eight) days. Next dose due May 11, 2022.  Follow up at [facility] shot clinic at 11:15 am.  Bring medication for injection with you (Patient not taking: Reported on 04/14/2022) 1 each 1   citalopram (CELEXA) 20 MG tablet Take 1 tablet (20 mg total) by mouth daily. (Patient not taking: Reported on 04/14/2022) 30 tablet 0   traZODone (DESYREL) 50 MG tablet Take 1 tablet (  50 mg total) by mouth at bedtime as needed for sleep. (Patient not taking: Reported on 04/10/2022) 30 tablet 0    Musculoskeletal: Strength & Muscle Tone: within normal limits Gait & Station: normal Patient leans: N/A  Psychiatric Specialty Exam:  Presentation  General Appearance: Appropriate for Environment  Eye Contact:Good  Speech:Clear and Coherent  Speech Volume:Normal  Handedness:Right   Mood and Affect  Mood:Euthymic  Affect:Appropriate; Congruent   Thought Process  Thought Processes:Coherent; Goal Directed  Descriptions of  Associations:Intact  Orientation:Full (Time, Place and Person)  Thought Content:Logical  History of Schizophrenia/Schizoaffective disorder:Yes  Duration of Psychotic Symptoms:Greater than six months  Hallucinations:Hallucinations: None Description of Auditory Hallucinations: denies any auditory hallucinations at this time.  Ideas of Reference:None  Suicidal Thoughts:Suicidal Thoughts: No  Homicidal Thoughts:Homicidal Thoughts: No   Sensorium  Memory:Immediate Fair; Recent Fair; Remote Fair  Judgment:Fair  Insight:Fair   Executive Functions  Concentration:Fair  Attention Span:Fair  Recall:Fair  Fund of Knowledge:Fair  Language:Fair   Psychomotor Activity  Psychomotor Activity:Psychomotor Activity: Normal  Assets  Assets:Communication Skills; Financial Resources/Insurance; Resilience; Physical Health   Sleep  Sleep:Sleep: Good   Physical Exam: Physical Exam Vitals and nursing note reviewed.  Constitutional:      Appearance: Normal appearance. He is normal weight.  HENT:     Head: Normocephalic.     Nose: Nose normal.     Mouth/Throat:     Mouth: Mucous membranes are moist.     Pharynx: Oropharynx is clear.  Eyes:     Pupils: Pupils are equal, round, and reactive to light.  Cardiovascular:     Rate and Rhythm: Normal rate.     Pulses: Normal pulses.  Pulmonary:     Effort: Pulmonary effort is normal.  Abdominal:     General: Abdomen is flat.  Musculoskeletal:        General: Normal range of motion.     Cervical back: Normal range of motion.  Skin:    General: Skin is warm and dry.  Neurological:     Mental Status: He is alert and oriented to person, place, and time. Mental status is at baseline.  Psychiatric:        Attention and Perception: Attention and perception normal.        Mood and Affect: Mood and affect normal.        Speech: Speech normal.        Behavior: Behavior is cooperative.        Cognition and Memory: Cognition and  memory normal.        Judgment: Judgment normal.   Review of Systems  Psychiatric/Behavioral:  Positive for substance abuse. Negative for hallucinations and suicidal ideas.   All other systems reviewed and are negative. Blood pressure 123/89, pulse 78, temperature 98 F (36.7 C), temperature source Oral, resp. rate 16, height 5\' 11"  (1.803 m), weight 88.5 kg, SpO2 97 %. Body mass index is 27.2 kg/m.  Treatment Plan Summary: Plan Based on patient presentation and chart review, patient will be psych cleared for discharge; [facility] address [facility] needs.   Disposition: No evidence of imminent risk to self or others at present.   Patient does not meet criteria for psychiatric inpatient admission. Supportive therapy provided about ongoing stressors. Discussed crisis plan, support from social network, calling 911, coming to the Emergency Department, and calling Suicide Hotline.  This service was provided via telemedicine using a 2-way, interactive audio and video technology.  Names of all persons participating in this  telemedicine service and their role in this encounter. Name: Maxie Barb Role: PMHNP  Name: Nelly Rout Role: Attending MD  Name: Precious Bard Role: patient  Name:  Role:     Loletta Parish, NP 04/14/2022 7:13 PM

## 2022-04-14 NOTE — ED Triage Notes (Addendum)
BIB EMS from the streets wants to seek detox for heroin and fentanyl last use one hour prior to hospital. Suicidal with no plan

## 2022-04-14 NOTE — ED Notes (Signed)
Patient dressed out. Two Belonging bags placed in cabinet 5-8.

## 2022-04-14 NOTE — ED Notes (Signed)
An After Visit Summary was printed and given to the patient. Discharge instructions given and no further questions at this time.  Pt aware resources are in his discharge papers. Pt ambulatory, A&Ox4.

## 2022-04-15 ENCOUNTER — Other Ambulatory Visit: Payer: Self-pay

## 2022-04-15 ENCOUNTER — Emergency Department (HOSPITAL_COMMUNITY)
Admission: EM | Admit: 2022-04-15 | Discharge: 2022-04-15 | Disposition: A | Discharge status: Home / Self Care | Payer: Medicaid Other | Attending: Emergency Medicine | Admitting: Emergency Medicine

## 2022-04-15 ENCOUNTER — Encounter (HOSPITAL_COMMUNITY): Payer: Self-pay

## 2022-04-15 DIAGNOSIS — M25562 Pain in left knee: Secondary | ICD-10-CM | POA: Insufficient documentation

## 2022-04-15 DIAGNOSIS — F1910 Other psychoactive substance abuse, uncomplicated: Secondary | ICD-10-CM

## 2022-04-15 DIAGNOSIS — F1510 Other stimulant abuse, uncomplicated: Secondary | ICD-10-CM | POA: Insufficient documentation

## 2022-04-15 DIAGNOSIS — R45851 Suicidal ideations: Secondary | ICD-10-CM | POA: Insufficient documentation

## 2022-04-15 DIAGNOSIS — F1410 Cocaine abuse, uncomplicated: Secondary | ICD-10-CM | POA: Insufficient documentation

## 2022-04-15 LAB — CBC
HCT: 38.6 % — ABNORMAL LOW (ref 39.0–52.0)
Hemoglobin: 14.1 g/dL (ref 13.0–17.0)
MCH: 29.7 pg (ref 26.0–34.0)
MCHC: 36.5 g/dL — ABNORMAL HIGH (ref 30.0–36.0)
MCV: 81.4 fL (ref 80.0–100.0)
Platelets: 237 10*3/uL (ref 150–400)
RBC: 4.74 MIL/uL (ref 4.22–5.81)
RDW: 14.4 % (ref 11.5–15.5)
WBC: 10.8 10*3/uL — ABNORMAL HIGH (ref 4.0–10.5)
nRBC: 0 % (ref 0.0–0.2)

## 2022-04-15 LAB — COMPREHENSIVE METABOLIC PANEL
ALT: 35 U/L (ref 0–44)
AST: 32 U/L (ref 15–41)
Albumin: 3.9 g/dL (ref 3.5–5.0)
Alkaline Phosphatase: 36 U/L — ABNORMAL LOW (ref 38–126)
Anion gap: 9 (ref 5–15)
BUN: 13 mg/dL (ref 6–20)
CO2: 21 mmol/L — ABNORMAL LOW (ref 22–32)
Calcium: 8.7 mg/dL — ABNORMAL LOW (ref 8.9–10.3)
Chloride: 103 mmol/L (ref 98–111)
Creatinine, Ser: 0.95 mg/dL (ref 0.61–1.24)
GFR, Estimated: >60 mL/min (ref >60)
Glucose, Bld: 88 mg/dL (ref 70–99)
Potassium: 4 mmol/L (ref 3.5–5.1)
Sodium: 133 mmol/L — ABNORMAL LOW (ref 135–145)
Total Bilirubin: 0.9 mg/dL (ref 0.3–1.2)
Total Protein: 7.8 g/dL (ref 6.5–8.1)

## 2022-04-15 LAB — ETHANOL: Alcohol, Ethyl (B): <10 mg/dL (ref <10)

## 2022-04-15 MED ORDER — IBUPROFEN 400 MG PO TABS: 600.0000 mg | ORAL_TABLET | Freq: Once | ORAL | Status: DC

## 2022-04-15 NOTE — ED Notes (Signed)
Belongings returned to pt

## 2022-04-15 NOTE — ED Provider Notes (Signed)
[facility] EMERGENCY DEPARTMENT Provider Note   CSN: [id number] Arrival date & time: 04/15/22  1627     History  Chief Complaint  Patient presents with   Needs Detox    Dominic [name] is a 46 y.o. male with an extensive past medical history including schizophrenia, schizoaffective disorder, polysubstance abuse, substance-induced mood disorder, with history of frequent emergency department visits notably with this being the fifth visit within around a week time for suicidal ideation, and substance abuse related concerns.  Patient presents again this afternoon after being discharged yesterday evening stating that he used methamphetamine, cocaine this morning.  Patient also endorses tobacco and marijuana use.  Patient reported suicidal ideation with plan to drive a car in front of a train.  He reports history of suicide attempts.  His suicidal ideation seems conditional, and related to his substance abuse. He denies any homicidal ideations.  He endorses occasional audiovisual hallucinations but denies any at the time of my evaluation.  Patient was cleared by TTS yesterday.  He endorses some pain in the left knee.  HPI     Home Medications Prior to Admission medications   Medication Sig Start Date End Date Taking? Authorizing Provider  ARIPiprazole (ABILIFY) 10 MG tablet Take 1 tablet (10 mg total) by mouth daily for 14 days. 04/12/22 04/26/22  Rankin, Shuvon B, NP  ARIPiprazole ER (ABILIFY MAINTENA) 400 MG SRER injection Inject 2 mLs (400 mg total) into the muscle every 28 (twenty-eight) days. Next dose due May 11, 2022.  Follow up at [facility] shot clinic at 11:15 am.  Bring medication for injection with you Patient not taking: Reported on 04/14/2022 04/12/22   Rankin, Shuvon B, NP  citalopram (CELEXA) 20 MG tablet Take 1 tablet (20 mg total) by mouth daily. Patient not taking: Reported on 04/14/2022 04/12/22   Rankin, Shuvon B, NP  traZODone  (DESYREL) 50 MG tablet Take 1 tablet (50 mg total) by mouth at bedtime as needed for sleep. Patient not taking: Reported on 04/10/2022 02/17/22   Layla Barter, NP      Allergies    Patient has no known allergies.    Review of Systems   Review of Systems  Psychiatric/Behavioral:  Positive for suicidal ideas.   All other systems reviewed and are negative.  Physical Exam Updated Vital Signs BP 115/76 (BP Location: Right Arm)   Pulse 92   Temp 99.1 F (37.3 C) (Oral)   Resp 18   Ht 5\' 11"  (1.803 m)   Wt 83.9 kg   SpO2 97%   BMI 25.80 kg/m  Physical Exam Vitals and nursing note reviewed.  Constitutional:      General: He is not in acute distress.    Appearance: Normal appearance.     Comments: Disheveled  HENT:     Head: Normocephalic and atraumatic.  Eyes:     General:        Right eye: No discharge.        Left eye: No discharge.  Cardiovascular:     Rate and Rhythm: Normal rate and regular rhythm.     Heart sounds: No murmur heard.   No friction rub. No gallop.  Pulmonary:     Effort: Pulmonary effort is normal.     Breath sounds: Normal breath sounds.  Abdominal:     General: Bowel sounds are normal.     Palpations: Abdomen is soft.  Skin:    General: Skin is warm and  dry.     Capillary Refill: Capillary refill takes less than 2 seconds.  Neurological:     Mental Status: He is alert and oriented to person, place, and time.  Psychiatric:        Mood and Affect: Mood normal.        Behavior: Behavior normal.     Comments: Without any signs of mania.  He has normal thought content, responds to questions without any signs of current intoxication or confusion.  He does not seem to be responding to internal stimuli.  Despite endorsing SI patient seems to show no signs of emotional distress, or depressive symptoms.  He does endorse lack of sleep for the last 5 days.    ED Results / Procedures / Treatments   Labs (all labs ordered are listed, but only abnormal  results are displayed) Labs Reviewed  COMPREHENSIVE METABOLIC PANEL - Abnormal; Notable for the following components:      Result Value   Sodium 133 (*)    CO2 21 (*)    Calcium 8.7 (*)    Alkaline Phosphatase 36 (*)    All other components within normal limits  CBC - Abnormal; Notable for the following components:   WBC 10.8 (*)    HCT 38.6 (*)    MCHC 36.5 (*)    All other components within normal limits  ETHANOL    EKG None  Radiology DG Chest 2 View  Result Date: 04/14/2022 CLINICAL DATA:  Provided history: Follow-up on opacity seen on AP view. EXAM: CHEST - 2 VIEW COMPARISON:  Prior chest radiographs 04/14/2022 and earlier. FINDINGS: Heart size within normal limits. Redemonstrated subtle focus of scarring within the right upper lobe. No appreciable airspace consolidation. Specifically, the previously question airspace opacity within the left lung base is not appreciated. No evidence of pleural effusion or pneumothorax. No acute bony abnormality identified. IMPRESSION: No evidence of acute cardiopulmonary abnormality. Redemonstrated subtle focus of scarring within the right upper lobe. Electronically Signed   By: Jackey Loge D.O.   On: 04/14/2022 08:47   DG Chest Port 1 View  Result Date: 04/14/2022 CLINICAL DATA:  46 year old male with sudden onset chest pain. EXAM: PORTABLE CHEST 1 VIEW COMPARISON:  Portable chest 01/27/2022 and earlier. FINDINGS: Portable AP semi upright view at 0549 hours. Lung volumes and mediastinal contours are stable and within normal limits. Visualized tracheal air column is within normal limits. There is streaky increased opacity at the left lung base along the left cardiac contour. But elsewhere lung markings appear stable and Allowing for portable technique the lungs are clear. No pneumothorax or pleural effusion. No acute osseous abnormality identified. Negative visible bowel gas. IMPRESSION: 1. Suspicion of acute airspace opacity in the left lower lung  along the heart border on this portable exam. PA and lateral radiographs may confirm. 2. No other acute cardiopulmonary abnormality. Electronically Signed   By: Odessa Fleming M.D.   On: 04/14/2022 05:59    Procedures Procedures    Medications Ordered in ED Medications - No data to display  ED Course/ Medical Decision Making/ A&P                           Medical Decision Making Amount and/or Complexity of Data Reviewed Labs: ordered.   This an overall well-appearing but disheveled patient with history of frequent emergency department visits, notably he has been seen at least 5 times in the last week, he has been cleared  multiple times by TTS in the last month for suicidal ideations.  He has not had any recent encounters for intentional overdose or injuries related to suicide attempts.  His suicidal ideation seem conditional, related to his substance abuse.  He also has a history of malingering.  Clinically he does not show any signs of dehydration, is not endorsing chest pain, shortness of breath, is afebrile.  He had a unremarkable CK at his last evaluation, clinically doubt rhabdomyolysis.  Leukocytosis noted yesterday is significantly improved today, was likely secondary to minimal hemoconcentration, stress, versus other.  Shows no signs of infection today.  Despite suicidal ideation, clinically I think that his ideations are passive, and related to his drug abuse.  As he has been cleared multiple times for psych reasons recently I think it is reasonable to discharge patient with psychiatric resources at this time.  Extensive return precautions given including 24-hour crisis hotline. Final Clinical Impression(s) / ED Diagnoses Final diagnoses:  Suicidal ideation  Polysubstance abuse [facility](HCC)    Rx / DC Orders ED Discharge Orders     None         [place] Balirosperi, Jarom Govan H, PA-C 04/15/22 1807    Terald Sleeperrifan, Matthew J, MD 04/15/22 2107

## 2022-04-15 NOTE — ED Triage Notes (Signed)
Pt came in via POV with SI/HI thoughts & says he needs to detox. He last took cocaine & ice at 0700 this morning. A/Ox4, endorses sweating, & some nausea.

## 2022-04-15 NOTE — ED Notes (Signed)
Pt belongings inventoried and placed in purple zone locker #3. Security at bedside to wand pt.

## 2022-04-15 NOTE — ED Notes (Signed)
Pt verbalizes understanding of discharge instructions. Opportunity for questions and answers were provided. Pt discharged from the ED.   ?

## 2022-04-16 ENCOUNTER — Emergency Department (HOSPITAL_COMMUNITY)
Admission: EM | Admit: 2022-04-16 | Discharge: 2022-04-18 | Disposition: A | Discharge status: Home / Self Care | Payer: Medicaid Other | Attending: Emergency Medicine | Admitting: Emergency Medicine

## 2022-04-16 ENCOUNTER — Encounter (HOSPITAL_COMMUNITY): Payer: Self-pay

## 2022-04-16 DIAGNOSIS — F1994 Other psychoactive substance use, unspecified with psychoactive substance-induced mood disorder: Secondary | ICD-10-CM | POA: Diagnosis not present

## 2022-04-16 DIAGNOSIS — Z043 Encounter for examination and observation following other accident: Secondary | ICD-10-CM | POA: Diagnosis present

## 2022-04-16 DIAGNOSIS — R440 Auditory hallucinations: Secondary | ICD-10-CM | POA: Insufficient documentation

## 2022-04-16 DIAGNOSIS — F1910 Other psychoactive substance abuse, uncomplicated: Secondary | ICD-10-CM | POA: Diagnosis not present

## 2022-04-16 DIAGNOSIS — R45851 Suicidal ideations: Secondary | ICD-10-CM | POA: Insufficient documentation

## 2022-04-16 DIAGNOSIS — Y900 Blood alcohol level of less than 20 mg/100 ml: Secondary | ICD-10-CM | POA: Insufficient documentation

## 2022-04-16 LAB — ETHANOL: Alcohol, Ethyl (B): <10 mg/dL (ref <10)

## 2022-04-16 LAB — BASIC METABOLIC PANEL
Anion gap: 5 (ref 5–15)
BUN: 17 mg/dL (ref 6–20)
CO2: 26 mmol/L (ref 22–32)
Calcium: 8.9 mg/dL (ref 8.9–10.3)
Chloride: 108 mmol/L (ref 98–111)
Creatinine, Ser: 0.75 mg/dL (ref 0.61–1.24)
GFR, Estimated: >60 mL/min (ref >60)
Glucose, Bld: 136 mg/dL — ABNORMAL HIGH (ref 70–99)
Potassium: 3.9 mmol/L (ref 3.5–5.1)
Sodium: 139 mmol/L (ref 135–145)

## 2022-04-16 LAB — SALICYLATE LEVEL: Salicylate Lvl: <7 mg/dL — ABNORMAL LOW (ref 7.0–30.0)

## 2022-04-16 LAB — CBC WITH DIFFERENTIAL/PLATELET
Abs Immature Granulocytes: 0.01 10*3/uL (ref 0.00–0.07)
Basophils Absolute: 0 10*3/uL (ref 0.0–0.1)
Basophils Relative: 1 %
Eosinophils Absolute: 0.1 10*3/uL (ref 0.0–0.5)
Eosinophils Relative: 2 %
HCT: 37.8 % — ABNORMAL LOW (ref 39.0–52.0)
Hemoglobin: 13.5 g/dL (ref 13.0–17.0)
Immature Granulocytes: 0 %
Lymphocytes Relative: 43 %
Lymphs Abs: 3.3 10*3/uL (ref 0.7–4.0)
MCH: 29.3 pg (ref 26.0–34.0)
MCHC: 35.7 g/dL (ref 30.0–36.0)
MCV: 82.2 fL (ref 80.0–100.0)
Monocytes Absolute: 0.8 10*3/uL (ref 0.1–1.0)
Monocytes Relative: 11 %
Neutro Abs: 3.2 10*3/uL (ref 1.7–7.7)
Neutrophils Relative %: 43 %
Platelets: 228 10*3/uL (ref 150–400)
RBC: 4.6 MIL/uL (ref 4.22–5.81)
RDW: 14.6 % (ref 11.5–15.5)
WBC: 7.5 10*3/uL (ref 4.0–10.5)
nRBC: 0 % (ref 0.0–0.2)

## 2022-04-16 LAB — ACETAMINOPHEN LEVEL: Acetaminophen (Tylenol), Serum: <10 ug/mL — ABNORMAL LOW (ref 10–30)

## 2022-04-16 LAB — RAPID URINE DRUG SCREEN, HOSP PERFORMED
Amphetamines: POSITIVE — AB
Barbiturates: NOT DETECTED
Benzodiazepines: POSITIVE — AB
Cocaine: POSITIVE — AB
Opiates: NOT DETECTED
Tetrahydrocannabinol: POSITIVE — AB

## 2022-04-16 MED ORDER — CLONIDINE HCL 0.1 MG PO TABS
0.1000 mg | ORAL_TABLET | Freq: Four times a day (QID) | ORAL | Status: DC
  Administered 2022-04-16 – 2022-04-17 (×2): 0.1 mg via ORAL
  Filled 2022-04-16 (×2): qty 1

## 2022-04-16 MED ORDER — ZIPRASIDONE MESYLATE 20 MG IM SOLR: 20.0000 mg | INTRAMUSCULAR | Status: DC | PRN

## 2022-04-16 MED ORDER — NAPROXEN 500 MG PO TABS: 500.0000 mg | ORAL_TABLET | Freq: Two times a day (BID) | ORAL | Status: DC | PRN

## 2022-04-16 MED ORDER — CLONIDINE HCL 0.1 MG PO TABS: 0.1000 mg | ORAL_TABLET | Freq: Every day | ORAL | Status: DC

## 2022-04-16 MED ORDER — METHOCARBAMOL 500 MG PO TABS
500.0000 mg | ORAL_TABLET | Freq: Three times a day (TID) | ORAL | Status: DC | PRN
  Administered 2022-04-17: 500 mg via ORAL
  Filled 2022-04-16: qty 1

## 2022-04-16 MED ORDER — OLANZAPINE 10 MG PO TBDP
10.0000 mg | ORAL_TABLET | Freq: Three times a day (TID) | ORAL | Status: DC | PRN
  Administered 2022-04-17: 10 mg via ORAL
  Filled 2022-04-16: qty 1

## 2022-04-16 MED ORDER — HYDROXYZINE HCL 25 MG PO TABS
25.0000 mg | ORAL_TABLET | Freq: Four times a day (QID) | ORAL | Status: DC | PRN
  Administered 2022-04-17: 25 mg via ORAL
  Filled 2022-04-16: qty 1

## 2022-04-16 MED ORDER — LORAZEPAM 1 MG PO TABS
1.0000 mg | ORAL_TABLET | ORAL | Status: DC | PRN
Start: 2022-04-16 — End: 2022-04-18

## 2022-04-16 MED ORDER — CLONIDINE HCL 0.1 MG PO TABS: 0.1000 mg | ORAL_TABLET | ORAL | Status: DC

## 2022-04-16 MED ORDER — OLANZAPINE 10 MG PO TBDP
10.0000 mg | ORAL_TABLET | Freq: Every day | ORAL | Status: DC
  Administered 2022-04-16 – 2022-04-18 (×3): 10 mg via ORAL
  Filled 2022-04-16 (×3): qty 1

## 2022-04-16 MED ORDER — DICYCLOMINE HCL 20 MG PO TABS: 20.0000 mg | ORAL_TABLET | Freq: Four times a day (QID) | ORAL | Status: DC | PRN

## 2022-04-16 MED ORDER — ONDANSETRON 4 MG PO TBDP: 4.0000 mg | ORAL_TABLET | Freq: Four times a day (QID) | ORAL | Status: DC | PRN

## 2022-04-16 MED ORDER — GABAPENTIN 100 MG PO CAPS
100.0000 mg | ORAL_CAPSULE | Freq: Three times a day (TID) | ORAL | Status: DC
Start: 2022-04-16 — End: 2022-04-16

## 2022-04-16 MED ORDER — LOPERAMIDE HCL 2 MG PO CAPS: 2.0000 mg | ORAL_CAPSULE | ORAL | Status: DC | PRN

## 2022-04-16 NOTE — ED Provider Notes (Signed)
[facility]-EMERGENCY DEPT Provider Note   CSN: [id number] Arrival date & time: 04/16/22  1148     History  Chief Complaint  Patient presents with   Addiction Problem    Dominic [name] is a 46 y.o. male.  46 year old male with past medical history of paranoid schizophrenia and recurrent emergency room visits presents today for evaluation of suicidal ideations with a plan.  He also endorses auditory hallucinations which are telling him to harm himself.  Reports noncompliance with his medications for about 3 weeks.  He denies other complaints.   The history is provided by the patient. No language interpreter was used.      Home Medications Prior to Admission medications   Medication Sig Start Date End Date Taking? Authorizing Provider  ARIPiprazole (ABILIFY) 10 MG tablet Take 1 tablet (10 mg total) by mouth daily for 14 days. 04/12/22 04/26/22  Rankin, Shuvon B, NP  ARIPiprazole ER (ABILIFY MAINTENA) 400 MG SRER injection Inject 2 mLs (400 mg total) into the muscle every 28 (twenty-eight) days. Next dose due May 11, 2022.  Follow up at [facility] shot clinic at 11:15 am.  Bring medication for injection with you Patient not taking: Reported on 04/14/2022 04/12/22   Rankin, Shuvon B, NP  citalopram (CELEXA) 20 MG tablet Take 1 tablet (20 mg total) by mouth daily. Patient not taking: Reported on 04/14/2022 04/12/22   Rankin, Shuvon B, NP  traZODone (DESYREL) 50 MG tablet Take 1 tablet (50 mg total) by mouth at bedtime as needed for sleep. Patient not taking: Reported on 04/10/2022 02/17/22   Layla Barter, NP      Allergies    Patient has no known allergies.    Review of Systems   Review of Systems  Constitutional:  Negative for chills and fever.  Cardiovascular:  Negative for chest pain.  Neurological:  Negative for light-headedness.  Psychiatric/Behavioral:  Positive for suicidal ideas.   All other systems reviewed and are  negative.  Physical Exam Updated Vital Signs BP 129/64 (BP Location: Right Arm)   Pulse 90   Temp 98 F (36.7 C) (Oral)   Resp 18   Ht 5\' 11"  (1.803 m)   Wt 83.9 kg   SpO2 99%   BMI 25.80 kg/m  Physical Exam Vitals and nursing note reviewed.  Constitutional:      General: He is not in acute distress.    Appearance: Normal appearance. He is not ill-appearing.  HENT:     Head: Normocephalic and atraumatic.     Nose: Nose normal.  Eyes:     Conjunctiva/sclera: Conjunctivae normal.  Cardiovascular:     Rate and Rhythm: Normal rate and regular rhythm.  Pulmonary:     Effort: Pulmonary effort is normal. No respiratory distress.     Breath sounds: No wheezing or rales.  Abdominal:     General: There is no distension.     Tenderness: There is no abdominal tenderness. There is no guarding.  Musculoskeletal:        General: No deformity.  Skin:    Findings: No rash.  Neurological:     Mental Status: He is alert.    ED Results / Procedures / Treatments   Labs (all labs ordered are listed, but only abnormal results are displayed) Labs Reviewed  CBC WITH DIFFERENTIAL/PLATELET - Abnormal; Notable for the following components:      Result Value   HCT 37.8 (*)    All other components  within normal limits  BASIC METABOLIC PANEL - Abnormal; Notable for the following components:   Glucose, Bld 136 (*)    All other components within normal limits  SALICYLATE LEVEL - Abnormal; Notable for the following components:   Salicylate Lvl <7.0 (*)    All other components within normal limits  ACETAMINOPHEN LEVEL - Abnormal; Notable for the following components:   Acetaminophen (Tylenol), Serum <10 (*)    All other components within normal limits  ETHANOL  RAPID URINE DRUG SCREEN, HOSP PERFORMED    EKG None  Radiology No results found.  Procedures Procedures    Medications Ordered in ED Medications - No data to display  ED Course/ Medical Decision Making/ A&P                            Medical Decision Making Amount and/or Complexity of Data Reviewed Labs: ordered.   47 year old male with past medical history of paranoid schizophrenia, recurrent emergency room visits presents today for evaluation of auditory hallucinations and suicidal ideations.  Reports noncompliance with his medications.  Without other complaints.  Will evaluate with labs, and place TTS consult.  He also endorses polysubstance abuse with cocaine use about 3 days, meth use yesterday, and daily marijuana use.  Endorses alcohol use.  Last drink about 1 week ago. CBC without leukocytosis or anemia.  Alcohol, Tylenol, salicylate levels within normal limits.  BMP with glucose 136 otherwise unremarkable.  EKG pending.  Patient is cleared for psychiatric evaluation. Patient signed out to default provider  Final Clinical Impression(s) / ED Diagnoses Final diagnoses:  Polysubstance abuse [facility])        Marita Kansas, PA-C 04/19/22 0115    Virgina Norfolk, DO 04/20/22 1455

## 2022-04-16 NOTE — ED Triage Notes (Signed)
Pt reports that he wants to detox. Pt states he has been taking "Ice, cocaine, fentanyl." Pt states he has started withdrawing. Pt reports shakes/nausea. Pt reports hallucinations and hearing voices that are telling him to cut his arms off. Pt states he got clean earlier this year at [place].
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
## 2022-04-17 DIAGNOSIS — F1910 Other psychoactive substance abuse, uncomplicated: Secondary | ICD-10-CM | POA: Diagnosis not present

## 2022-04-17 NOTE — Consult Note (Signed)
Telepsych Consultation   Reason for Consult:  Psychiatric Reassessment for SI Referring Physician:  Evlyn Courier, PA-C Location of Patient:    [facility] Location of Provider: Other: virtual home office  Patient Identification: Dominic [name] MRN:  [id number] Principal Diagnosis: Polysubstance abuse ([place]) Diagnosis:  Principal Problem:   Polysubstance abuse ([place])   Total Time spent with patient: 30 minutes  Subjective:   Dominic [name] is a 46 y.o. male patient admitted with suicidal ideations and plan to cut his arm off.  Admission UDS was + for amphetamines, cocaine, THC and benzodiazepines.    HPI:   Patient seen via telepsych by this provider; chart reviewed and consulted with Dr. Dwyane Dee on 04/17/22.  On evaluation Dominic [name] reports a mental health hx for schizoaffective disorder and psychiatric medication non-compliance.  He reports self medicating with  "cocaine, methamphetamine, marijuana and benzos" on and off for several months.  Last usage was yesterday, when using illicit substances he has suicidal ideations an audible or visual hallucinations that improves when he 's no longer intoxicated.  States he sleep, "great"  and appetite is good.  He was restarted on olanzapine 10mg  po daily on arrival, has tolerated well and denies medication side effects.   Today he is alert and oriented x4; he no longer endorses suicidal ideations, plan or intent for self harm and contracts for safety.  States he would like referral to residential tx facility for SUD.  He is clear and coherent, denies AVH and able to appropriately verbalize his needs.  He carries the dx of schizoaffective disorder and does not take medications as prescribed but on eval today, he does not appear psychotic and he's not responding to internal stimulus. He is future oriented and relates his plan to get "clean from drugs."  Patient has been medically cleared. QtQTC is 348/428, no prolonged QTC interval.    Past Psychiatric History: as outlined above  Risk to Self:  no Risk to Others:  no Prior Inpatient Therapy:  yes Prior Outpatient Therapy:  yes  Past Medical History:  Past Medical History:  Diagnosis Date   Anxiety    Asthma    Bipolar 1 disorder ([place])    Chronic hepatitis C without hepatic coma ([place]) 05/28/2018   Depression    Hepatitis C    Hepatitis C antibody positive in blood 05/28/2018   Malingering 03/30/2022   Psychiatry note from [facility] states "directly admitted to fabricating SI and CAH to obtain [psychiatric] admission"   Schizophrenia ([place])    Sleep apnea     Past Surgical History:  Procedure Laterality Date   CYST REMOVAL NECK     neck   Family History:  Family History  Problem Relation Age of Onset   Hypertension Sister    Schizophrenia Sister    Family Psychiatric  History: unknonw Social History:  Social History   Substance and Sexual Activity  Alcohol Use Yes   Alcohol/week: 3.0 standard drinks   Types: 3 Shots of liquor per week     Social History   Substance and Sexual Activity  Drug Use Yes   Types: Cocaine, Marijuana, Methamphetamines    Social History   Socioeconomic History   Marital status: Single    Spouse name: Not on file   Number of children: Not on file   Years of education: Not on file   Highest education level: Not on file  Occupational History   Not on file  Tobacco Use  Smoking status: Some Days    Packs/day: 1.00    Types: Cigarettes   Smokeless tobacco: Never  Vaping Use   Vaping Use: Never used  Substance and Sexual Activity   Alcohol use: Yes    Alcohol/week: 3.0 standard drinks    Types: 3 Shots of liquor per week   Drug use: Yes    Types: Cocaine, Marijuana, Methamphetamines   Sexual activity: Not Currently  Other Topics Concern   Not on file  Social History Narrative   Not on file   Social Determinants of Health   Financial Resource Strain: Not on file  Food Insecurity: Not on file   Transportation Needs: Not on file  Physical Activity: Not on file  Stress: Not on file  Social Connections: Not on file   Additional Social History:    Allergies:  No Known Allergies  Labs:  Results for orders placed or performed during the hospital encounter of 04/16/22 (from the past 48 hour(s))  CBC with Differential     Status: Abnormal   Collection Time: 04/16/22  1:13 PM  Result Value Ref Range   WBC 7.5 4.0 - 10.5 K/uL   RBC 4.60 4.22 - 5.81 MIL/uL   Hemoglobin 13.5 13.0 - 17.0 g/dL   HCT 37.8 (L) 39.0 - 52.0 %   MCV 82.2 80.0 - 100.0 fL   MCH 29.3 26.0 - 34.0 pg   MCHC 35.7 30.0 - 36.0 g/dL   RDW 14.6 11.5 - 15.5 %   Platelets 228 150 - 400 K/uL   nRBC 0.0 0.0 - 0.2 %   Neutrophils Relative % 43 %   Neutro Abs 3.2 1.7 - 7.7 K/uL   Lymphocytes Relative 43 %   Lymphs Abs 3.3 0.7 - 4.0 K/uL   Monocytes Relative 11 %   Monocytes Absolute 0.8 0.1 - 1.0 K/uL   Eosinophils Relative 2 %   Eosinophils Absolute 0.1 0.0 - 0.5 K/uL   Basophils Relative 1 %   Basophils Absolute 0.0 0.0 - 0.1 K/uL   Immature Granulocytes 0 %   Abs Immature Granulocytes 0.01 0.00 - 0.07 K/uL    Comment: Performed at [facility], [place] [address], [place], [place] [id number]  Basic metabolic panel     Status: Abnormal   Collection Time: 04/16/22  1:13 PM  Result Value Ref Range   Sodium 139 135 - 145 mmol/L   Potassium 3.9 3.5 - 5.1 mmol/L   Chloride 108 98 - 111 mmol/L   CO2 26 22 - 32 mmol/L   Glucose, Bld 136 (H) 70 - 99 mg/dL    Comment: Glucose reference range applies only to samples taken after fasting for at least 8 hours.   BUN 17 6 - 20 mg/dL   Creatinine, Ser 0.75 0.61 - 1.24 mg/dL   Calcium 8.9 8.9 - 10.3 mg/dL   GFR, Estimated >60 >60 mL/min    Comment: (NOTE) Calculated using the CKD-EPI Creatinine Equation (2021)    Anion gap 5 5 - 15    Comment: Performed at [facility], [place] [address] [place], [place] [id number]  Salicylate level      Status: Abnormal   Collection Time: 04/16/22  1:13 PM  Result Value Ref Range   Salicylate Lvl Q000111Q (L) 7.0 - 30.0 mg/dL    Comment: Performed at [facility], [place] [address], [place], [place] [address]  Acetaminophen level     Status: Abnormal   Collection Time: 04/16/22  1:13 PM  Result Value Ref Range   Acetaminophen (Tylenol), Serum <10 (L) 10 - 30 ug/mL    Comment: (NOTE) Therapeutic concentrations vary significantly. A range of 10-30 ug/mL  may be an effective concentration for many patients. However, some  are best treated at concentrations outside of this range. Acetaminophen concentrations >150 ug/mL at 4 hours after ingestion  and >50 ug/mL at 12 hours after ingestion are often associated with  toxic reactions.  Performed at [facility], [place] [address] [place], [place] [address]   Ethanol     Status: None   Collection Time: 04/16/22  1:13 PM  Result Value Ref Range   Alcohol, Ethyl (B) <10 <10 mg/dL    Comment: (NOTE) Lowest detectable limit for serum alcohol is 10 mg/dL.  For medical purposes only. Performed at [facility], [place] [address], [place], [place] [address]   Rapid urine drug screen (hospital performed)     Status: Abnormal   Collection Time: 04/16/22  4:25 PM  Result Value Ref Range   Opiates NONE DETECTED NONE DETECTED   Cocaine POSITIVE (A) NONE DETECTED   Benzodiazepines POSITIVE (A) NONE DETECTED   Amphetamines POSITIVE (A) NONE DETECTED   Tetrahydrocannabinol POSITIVE (A) NONE DETECTED   Barbiturates NONE DETECTED NONE DETECTED    Comment: (NOTE) DRUG SCREEN FOR MEDICAL PURPOSES ONLY.  IF CONFIRMATION IS NEEDED FOR ANY PURPOSE, NOTIFY LAB WITHIN 5 DAYS.  LOWEST DETECTABLE LIMITS FOR URINE DRUG SCREEN Drug Class                     Cutoff (ng/mL) Amphetamine and metabolites    1000 Barbiturate and metabolites    200 Benzodiazepine                 [id number] Tricyclics and metabolites      300 Opiates and metabolites        300 Cocaine and metabolites        300 THC                            50 Performed at [facility], [place] [address], [place], [place] [address]     Medications:  Current Facility-Administered Medications  Medication Dose Route Frequency Provider Last Rate Last Admin   cloNIDine (CATAPRES) tablet 0.1 mg  0.1 mg Oral QID Leevy-Johnson, Brooke A, NP   0.1 mg at 04/17/22 1126   Followed by   Derrill Memo ON 04/18/2022] cloNIDine (CATAPRES) tablet 0.1 mg  0.1 mg Oral BH-qamhs Leevy-Johnson, Brooke A, NP       Followed by   Derrill Memo ON 04/21/2022] cloNIDine (CATAPRES) tablet 0.1 mg  0.1 mg Oral QAC breakfast Leevy-Johnson, Brooke A, NP       dicyclomine (BENTYL) tablet 20 mg  20 mg Oral Q6H PRN Leevy-Johnson, Brooke A, NP       hydrOXYzine (ATARAX) tablet 25 mg  25 mg Oral Q6H PRN Leevy-Johnson, Brooke A, NP       loperamide (IMODIUM) capsule 2-4 mg  2-4 mg Oral PRN Leevy-Johnson, Brooke A, NP       OLANZapine zydis (ZYPREXA) disintegrating tablet 10 mg  10 mg Oral Q8H PRN Leevy-Johnson, Brooke A, NP       And   LORazepam (ATIVAN) tablet 1 mg  1 mg Oral PRN Leevy-Johnson, Brooke A, NP       And   ziprasidone (GEODON) injection 20 mg  20 mg Intramuscular PRN Leevy-Johnson, Brooke A, NP       methocarbamol (ROBAXIN) tablet 500 mg  500 mg Oral Q8H PRN Leevy-Johnson, Brooke A, NP       naproxen (NAPROSYN) tablet 500 mg  500 mg Oral BID PRN Leevy-Johnson, Brooke A, NP       OLANZapine zydis (ZYPREXA) disintegrating tablet 10 mg  10 mg Oral Daily Leevy-Johnson, Brooke A, NP   10 mg at 04/17/22 1128   ondansetron (ZOFRAN-ODT) disintegrating tablet 4 mg  4 mg Oral Q6H PRN Leevy-Johnson, Brooke A, NP       Current Outpatient Medications  Medication Sig Dispense Refill   ARIPiprazole (ABILIFY) 10 MG tablet Take 1 tablet (10 mg total) by mouth daily for 14 days. (Patient not taking: Reported on 04/16/2022) 14 tablet 0   ARIPiprazole ER (ABILIFY MAINTENA) 400 MG  SRER injection Inject 2 mLs (400 mg total) into the muscle every 28 (twenty-eight) days. Next dose due May 11, 2022.  Follow up at [facility] shot clinic at 11:15 am.  Bring medication for injection with you (Patient not taking: Reported on 04/14/2022) 1 each 1   citalopram (CELEXA) 20 MG tablet Take 1 tablet (20 mg total) by mouth daily. (Patient not taking: Reported on 04/14/2022) 30 tablet 0   traZODone (DESYREL) 50 MG tablet Take 1 tablet (50 mg total) by mouth at bedtime as needed for sleep. (Patient not taking: Reported on 04/10/2022) 30 tablet 0    Musculoskeletal: pt moves all extremities and seen independently ambulating without concerns Strength & Muscle Tone: within normal limits Gait & Station: normal Patient leans: N/A   Psychiatric Specialty Exam:  Presentation  General Appearance: Appropriate for Environment; Casual  Eye Contact:Good  Speech:Clear and Coherent; Normal Rate  Speech Volume:Normal  Handedness:Right   Mood and Affect  Mood:Euthymic  Affect:Appropriate; Congruent   Thought Process  Thought Processes:Coherent  Descriptions of Associations:Intact  Orientation:Full (Time, Place and Person)  Thought Content:Logical (has improved since restarting meds and no longer intoxicated from illicit substance use)  History of Schizophrenia/Schizoaffective disorder:Yes  Duration of Psychotic Symptoms:Greater than six months  Hallucinations:Hallucinations: None  Ideas of Reference:None  Suicidal Thoughts:Suicidal Thoughts: No  Homicidal Thoughts:Homicidal Thoughts: No   Sensorium  Memory:Immediate Good; Recent Good; Remote Good  Judgment:Fair (baseline is fair in the setting of chronic drug use; no acute safety concerns) But good today since he is no longer intoxicated with illicit substances Insight:Fair But improves when he is no longer using illicit drugs  Executive Functions  Concentration:Good  Attention  Span:Good  [place] of Knowledge:Good  Language:Good   Psychomotor Activity  Psychomotor Activity:Psychomotor Activity: Normal   Assets  Assets:Communication Skills; Desire for Improvement; Financial Resources/Insurance   Sleep  Sleep:Sleep: Good Number of Hours of Sleep: 8    Physical Exam: Physical Exam Constitutional:      Appearance: Normal appearance.  Cardiovascular:     Rate and Rhythm: Normal rate.     Pulses: Normal pulses.  Pulmonary:     Effort: Pulmonary effort is normal.  Musculoskeletal:        General: Normal range of motion.     Cervical back: Normal range of motion.  Neurological:     General: No focal deficit present.     Mental Status: He is alert and oriented to person, place, and time.  Psychiatric:        Attention and Perception: Attention and perception normal.  Mood and Affect: Mood and affect normal.        Speech: Speech normal.        Behavior: Behavior normal. Behavior is cooperative.        Thought Content: Thought content normal. Thought content is not paranoid or delusional. Thought content does not include homicidal or suicidal ideation. Thought content does not include homicidal or suicidal plan.        Cognition and Memory: Cognition and memory normal.        Judgment: Judgment normal.   Review of Systems  Constitutional: Negative.   HENT: Negative.    Eyes: Negative.   Respiratory: Negative.    Cardiovascular: Negative.   Gastrointestinal: Negative.   Skin: Negative.   Neurological:  Positive for tremors, sensory change, speech change, focal weakness and seizures.  Endo/Heme/Allergies: Negative.   Psychiatric/Behavioral:  Positive for substance abuse. Negative for suicidal ideas (has improved since admission).   Blood pressure 106/83, pulse 61, temperature 98.6 F (37 C), temperature source Oral, resp. rate 15, height 5\' 11"  (1.803 m), weight 83.9 kg, SpO2 98 %. Body mass index is 25.8 kg/m.  Treatment  Plan Summary: Patient with polysubstance induced mood disorder, demonstrates symptomatic improved since he's no longer intoxicated, was given olanzapine 10mg  po and has had a chance to sleep. He contracts for safety and is future oriented.  Pt expresses his plan to go to residential program.   Plan- As per above assessment, there are no current grounds for involuntary commitment at this time.  Patient is not currently interested in inpatient services but expresses agreement to continue outpatient treatment., we have reviewed importance of substance abuse abstinence, potential negative impact substance abuse can have on his relationships and level of functioning, and importance of medication compliance.  Referral to SUD residential programl Mariea Clonts, LCSW to help with referral.   Disposition: No evidence of imminent risk to self or others at present.   Patient does not meet criteria for psychiatric inpatient admission. Supportive therapy provided about ongoing stressors. Discussed crisis plan, support from social network, calling 911, coming to the Emergency Department, and calling Suicide Hotline.  This service was provided via telemedicine using a 2-way, interactive audio and video technology.  Names of all persons participating in this telemedicine service and their role in this encounter. Name: Dominic [name] Role: Patient  Name: Merlyn Lot Role: PMHNP    Mallie Darting, NP 04/17/2022 11:47 AM

## 2022-04-17 NOTE — Progress Notes (Signed)
This CSW called to check on referral with [facility] at [phone]. CSW spoke with an employee but was advised that CSW would need to speak with Marcelino Duster. CSW was then transferred to [facility]. CSW left HIPPA complained voicemail and requested a phone call back.   Maryjean Ka, MSW, LCSWA 04/17/2022 5:27 PM

## 2022-04-17 NOTE — ED Notes (Signed)
Pt requesting to use, this RN advised pt he is allowed (3) 10 min ph calls daily between the hours of 9a-11a; 1p-3p, and 5p-7p

## 2022-04-17 NOTE — Progress Notes (Signed)
CSW contacted [facility] in [place], [place] to check bed availability. The [facility] receptionist has requested for the CSW to return the call in one hour. CSW has agreed to do so.    Damita Dunnings, MSW, LCSW-A  11:55 AM 04/17/2022

## 2022-04-17 NOTE — Progress Notes (Signed)
CSW spoke with Marcelino Duster the Intake Specialist at [facility] who confirmed bed availability. CSW agreed to fax [facility] referral for review. 2nd shift CSW to follow-up to confirm admissions. At [phone].   Damita Dunnings, MSW, LCSW-A  2:46 PM 04/17/2022

## 2022-04-18 DIAGNOSIS — F1910 Other psychoactive substance abuse, uncomplicated: Secondary | ICD-10-CM

## 2022-04-18 MED ORDER — OLANZAPINE 10 MG PO TBDP: 10.0000 mg | ORAL_TABLET | Freq: Every day | ORAL | 0 refills | Status: DC

## 2022-04-18 MED ORDER — HYDROXYZINE HCL 25 MG PO TABS
25.0000 mg | ORAL_TABLET | Freq: Four times a day (QID) | ORAL | 0 refills | Status: DC | PRN
Start: 2022-04-18 — End: 2022-04-20

## 2022-04-18 MED ORDER — METHOCARBAMOL 500 MG PO TABS
500.0000 mg | ORAL_TABLET | Freq: Three times a day (TID) | ORAL | 0 refills | Status: DC | PRN
Start: 2022-04-18 — End: 2022-04-20

## 2022-04-18 MED ORDER — DICYCLOMINE HCL 20 MG PO TABS
20.0000 mg | ORAL_TABLET | Freq: Four times a day (QID) | ORAL | 0 refills | Status: DC | PRN
Start: 2022-04-18 — End: 2022-04-20

## 2022-04-18 NOTE — ED Provider Notes (Signed)
Emergency Medicine Observation Re-evaluation Note  Dominic [name] is a 46 y.o. male, seen on rounds today.  Pt initially presented to the ED for complaints of Addiction Problem Currently, the patient is sleeping.  Physical Exam  BP 115/76 (BP Location: Left Arm)   Pulse 70   Temp (!) 97.5 F (36.4 C) (Oral)   Resp 18   Ht 5\' 11"  (1.803 m)   Wt 83.9 kg   SpO2 100%   BMI 25.80 kg/m  Physical Exam General: Sleeping Cardiac: Extremities perfused Lungs: Breathing unlabored Psych: Deferred  ED Course / MDM  EKG:   I have reviewed the labs performed to date as well as medications administered while in observation.  Recent changes in the last 24 hours include evaluated and cleared by psychiatry.  Plan  Current plan is for transfer to [facility].  Dominic [name] is not under involuntary commitment.     Delford Field, MD 04/18/22 0900

## 2022-04-18 NOTE — ED Notes (Signed)
Patient provided phone for use 

## 2022-04-18 NOTE — Discharge Summary (Signed)
[facility] ED Discharge  04/18/2022 3:35 PM Dominic [name]  MRN:  [id number]  Principal Problem: Polysubstance abuse [facility]) Discharge Diagnoses: Principal Problem:   Polysubstance abuse (HCC)  Clinical Impression:  Final diagnoses:  Polysubstance abuse (HCC)   Subjective: Dominic [name] is a 46 y.o. male patient admitted with suicidal ideations and plan to cut his arm off.  Admission UDS was + for amphetamines, cocaine, THC and benzodiazepines. Today patient has remained calm and cooperative.  He is interested in participating in long term substance abuse treatment.  He is ready to quit using illicit substances.  Patient has been accepted at [facility] recovery for treatment.  Plan is fore patient to come to the facility on Thursday.  Patient has been educated and given information to report to [facility] on [facility].  He has been advised also not to use any illicit substance before going to [facility].  He denies SI/HI/AVH and no mention of Paranoia.  Patient is discharged.  ED Assessment Time Calculation: Start Time: 1507 Stop Time: 1533 Total Time in Minutes (Assessment Completion): 26   Past Psychiatric History: Hx significant for suicide attempt, Polysubstance abuse, Schizoaffective disorder, depressive type, Medication non compliant, Alcohol abuse and Multiple ER visits for various types of Mental health issue.  He is supposed to be getting Monthly Injections of Abilify and Citalopram but he is not compliant with any.  Past Medical History:  Past Medical History:  Diagnosis Date   Anxiety    Asthma    Bipolar 1 disorder (HCC)    Chronic hepatitis C without hepatic coma (HCC) 05/28/2018   Depression    Hepatitis C    Hepatitis C antibody positive in blood 05/28/2018   Malingering 03/30/2022   Psychiatry note from [facility] states "directly admitted to fabricating SI and CAH to obtain [psychiatric] admission"   Schizophrenia [facility])    Sleep apnea     Past Surgical History:   Procedure Laterality Date   CYST REMOVAL NECK     neck   Family History:  Family History  Problem Relation Age of Onset   Hypertension Sister    Schizophrenia Sister    Family Psychiatric  History: hx of Schizophrenia per record in the family. Social History:  Social History   Substance and Sexual Activity  Alcohol Use Yes   Alcohol/week: 3.0 standard drinks   Types: 3 Shots of liquor per week     Social History   Substance and Sexual Activity  Drug Use Yes   Types: Cocaine, Marijuana, Methamphetamines    Social History   Socioeconomic History   Marital status: Single    Spouse name: Not on file   Number of children: Not on file   Years of education: Not on file   Highest education level: Not on file  Occupational History   Not on file  Tobacco Use   Smoking status: Some Days    Packs/day: 1.00    Types: Cigarettes   Smokeless tobacco: Never  Vaping Use   Vaping Use: Never used  Substance and Sexual Activity   Alcohol use: Yes    Alcohol/week: 3.0 standard drinks    Types: 3 Shots of liquor per week   Drug use: Yes    Types: Cocaine, Marijuana, Methamphetamines   Sexual activity: Not Currently  Other Topics Concern   Not on file  Social History Narrative   Not on file   Social Determinants of Health   Financial Resource Strain: Not on file  Food Insecurity: Not on file  Transportation Needs: Not on file  Physical Activity: Not on file  Stress: Not on file  Social Connections: Not on file    Tobacco Cessation:  N/A, patient does not currently use tobacco products  Current Medications: Current Facility-Administered Medications  Medication Dose Route Frequency Provider Last Rate Last Admin   dicyclomine (BENTYL) tablet 20 mg  20 mg Oral Q6H PRN Leevy-Johnson, Brooke A, NP       hydrOXYzine (ATARAX) tablet 25 mg  25 mg Oral Q6H PRN Leevy-Johnson, Brooke A, NP   25 mg at 04/17/22 2210   loperamide (IMODIUM) capsule 2-4 mg  2-4 mg Oral PRN  Leevy-Johnson, Brooke A, NP       OLANZapine zydis (ZYPREXA) disintegrating tablet 10 mg  10 mg Oral Q8H PRN Leevy-Johnson, Brooke A, NP   10 mg at 04/17/22 2210   And   LORazepam (ATIVAN) tablet 1 mg  1 mg Oral PRN Leevy-Johnson, Brooke A, NP       And   ziprasidone (GEODON) injection 20 mg  20 mg Intramuscular PRN Leevy-Johnson, Brooke A, NP       methocarbamol (ROBAXIN) tablet 500 mg  500 mg Oral Q8H PRN Leevy-Johnson, Brooke A, NP   500 mg at 04/17/22 2210   naproxen (NAPROSYN) tablet 500 mg  500 mg Oral BID PRN Leevy-Johnson, Brooke A, NP       OLANZapine zydis (ZYPREXA) disintegrating tablet 10 mg  10 mg Oral Daily Leevy-Johnson, Brooke A, NP   10 mg at 04/18/22 0932   ondansetron (ZOFRAN-ODT) disintegrating tablet 4 mg  4 mg Oral Q6H PRN Leevy-Johnson, Brooke A, NP       Current Outpatient Medications  Medication Sig Dispense Refill   ARIPiprazole ER (ABILIFY MAINTENA) 400 MG SRER injection Inject 2 mLs (400 mg total) into the muscle every 28 (twenty-eight) days. Next dose due May 11, 2022.  Follow up at [facility] shot clinic at 11:15 am.  Bring medication for injection with you (Patient not taking: Reported on 04/14/2022) 1 each 1   dicyclomine (BENTYL) 20 MG tablet Take 1 tablet (20 mg total) by mouth every 6 (six) hours as needed for spasms (abdominal cramping). 45 tablet 0   hydrOXYzine (ATARAX) 25 MG tablet Take 1 tablet (25 mg total) by mouth every 6 (six) hours as needed for anxiety. 30 tablet 0   methocarbamol (ROBAXIN) 500 MG tablet Take 1 tablet (500 mg total) by mouth every 8 (eight) hours as needed for up to 15 days for muscle spasms. 45 tablet 0   [START ON 04/19/2022] OLANZapine zydis (ZYPREXA) 10 MG disintegrating tablet Take 1 tablet (10 mg total) by mouth daily. 30 tablet 0   traZODone (DESYREL) 50 MG tablet Take 1 tablet (50 mg total) by mouth at bedtime as needed for sleep. (Patient not taking: Reported on 04/10/2022) 30 tablet 0   PTA  Medications: (Not in a hospital admission)   [place] Scale:  Flowsheet Row ED from 04/16/2022 in [facility] from 04/15/2022 in [facility] EM[facility]GENCY [facility]PART[facility] ED from 04/14/2022 in [facility]  C-SSRS RISK CATEGORY High Risk High Risk Error: Q7 should not be populated when Q6 is No       Musculoskeletal: Strength & Muscle Tone: within normal limits Gait & Station: normal Patient leans: Front  Psychiatric Specialty Exam: Presentation  General Appearance: Appropriate for Environment; Casual  Eye Contact:Good  Speech:Clear and Coherent;  Normal Rate  Speech Volume:Normal  Handedness:Right   Mood and Affect  Mood:Euthymic  Affect:Appropriate; Congruent   Thought Process  Thought Processes:Coherent  Descriptions of Associations:Intact  Orientation:Full (Time, Place and Person)  Thought Content:Logical (has improved since restarting meds and no longer intoxicated from illicit substance use)  History of Schizophrenia/Schizoaffective disorder:Yes  Duration of Psychotic Symptoms:Greater than six months  Hallucinations:Hallucinations: None  Ideas of Reference:None  Suicidal Thoughts:Suicidal Thoughts: No  Homicidal Thoughts:Homicidal Thoughts: No   Sensorium  Memory:Immediate Good; Recent Good; Remote Good  Judgment:Fair (baseline is fair in the setting of chronic drug use; no acute safety concerns)  Insight:Fair   Executive Functions  Concentration:Good  Attention Span:Good  Recall:Good  Fund of Knowledge:Good  Language:Good   Psychomotor Activity  Psychomotor Activity:Psychomotor Activity: Normal   Assets  Assets:Communication Skills; Desire for Improvement; Financial Resources/Insurance   Sleep  Sleep:Sleep: Good Number of Hours of Sleep: 8    Physical Exam: Physical Exam Vitals and nursing note reviewed.  Constitutional:      Appearance: Normal  appearance.  HENT:     Head: Normocephalic and atraumatic.     Nose: Nose normal.  Cardiovascular:     Rate and Rhythm: Normal rate and regular rhythm.  Pulmonary:     Effort: Pulmonary effort is normal.  Musculoskeletal:        General: Normal range of motion.     Cervical back: Normal range of motion.  Skin:    General: Skin is warm.  Neurological:     General: No focal deficit present.     Mental Status: He is alert and oriented to person, place, and time.   Review of Systems  Constitutional: Negative.   HENT: Negative.    Eyes: Negative.   Respiratory: Negative.    Cardiovascular: Negative.   Gastrointestinal: Negative.   Genitourinary: Negative.   Musculoskeletal: Negative.   Skin: Negative.   Neurological: Negative.   Endo/Heme/Allergies: Negative.   Psychiatric/Behavioral:  Positive for depression and substance abuse.   Blood pressure 129/71, pulse 76, temperature 98 F (36.7 C), resp. rate 20, height 5\' 11"  (1.803 m), weight 83.9 kg, SpO2 100 %. Body mass index is 25.8 kg/m.   Demographic Factors:  Male, Adolescent or young adult, Low socioeconomic status, and Unemployed  Loss Factors: NA  Historical Factors: Prior suicide attempts and Impulsivity  Risk Reduction Factors:   Positive coping skills or problem solving skills  Continued Clinical Symptoms:  Depression:   Impulsivity Insomnia Alcohol/Substance Abuse/Dependencies Previous Psychiatric Diagnoses and Treatments  Cognitive Features That Contribute To Risk:  None    Suicide Risk:  Minimal: No identifiable suicidal ideation.  Patients presenting with no risk factors but with morbid ruminations; may be classified as minimal risk based on the severity of the depressive symptoms    Plan Of Care/Follow-up recommendations:  Activity:  as tolerated Diet:  Regular  Medical Decision Making: Patient is interested in long term substance abuse treatment.  He has been accepted at [facility].  [facility]  Problem 1: Polysubstance abuse  Problem 2: Schizoaffective disorder, Depressed type  Disposition: Discharge  Earney NavyJosephine C Delora Gravatt, NP-PMHNP-BC 04/18/2022, 3:35 PM

## 2022-04-18 NOTE — ED Notes (Signed)
Patient walking back and forth out of room

## 2022-04-18 NOTE — Progress Notes (Signed)
Patient has been accepted to [facility]'s residential facility in [place], [place]. Patient is scheduled for admissions on Thursday June 8 at 9 AM. Patient must arrive to the facility on time to avoid any delays. CSW has informed ED RN and NP regarding acceptance.    Damita Dunnings, MSW, LCSW-A  2:16 PM 04/18/2022

## 2022-04-18 NOTE — ED Notes (Signed)
Pt has been accepted to [facility] in [place], [place] for Thursday June 8th. Pt has to arrive to the facility at 9 AM

## 2022-04-18 NOTE — ED Notes (Signed)
2 belonging bags returned. Discharge instructions reviewed

## 2022-04-18 NOTE — ED Notes (Signed)
Snack provided

## 2022-04-18 NOTE — Progress Notes (Signed)
CSW contacted [facility] to verify if this agency provides ACTT services to this patient, but there was no answer. CSW left a voice message asking the agency to return her call. CSW is awaiting to hear back.    Damita Dunnings, MSW, LCSW-A  1:31 PM 04/18/2022

## 2022-04-18 NOTE — Progress Notes (Signed)
CSW contacted [facility] at [facility], but there was no answer. CSW left a voice message asking the Pt to return the call. CSW is awaiting to hear back.    Mariea Clonts, MSW, LCSW-A  1:29 PM 04/18/2022

## 2022-04-18 NOTE — ED Notes (Signed)
Breakfast tray given. °

## 2022-04-18 NOTE — Discharge Instructions (Addendum)
You are scheduled for admission at [facility] on Thursday at 9 AM.  Address is the following:  [address] [place], [place] [address]

## 2022-04-19 ENCOUNTER — Ambulatory Visit (HOSPITAL_COMMUNITY)
Admission: EM | Admit: 2022-04-19 | Discharge: 2022-04-20 | Disposition: A | Discharge status: Home / Self Care | Payer: Medicaid Other | Source: Home / Self Care

## 2022-04-19 ENCOUNTER — Other Ambulatory Visit: Payer: Self-pay

## 2022-04-19 ENCOUNTER — Encounter (HOSPITAL_COMMUNITY): Payer: Self-pay | Admitting: Emergency Medicine

## 2022-04-19 ENCOUNTER — Emergency Department (HOSPITAL_COMMUNITY)
Admission: EM | Admit: 2022-04-19 | Discharge: 2022-04-19 | Disposition: A | Discharge status: Home / Self Care | Payer: Medicaid Other | Attending: Emergency Medicine | Admitting: Emergency Medicine

## 2022-04-19 ENCOUNTER — Emergency Department (HOSPITAL_COMMUNITY)
Admission: EM | Admit: 2022-04-19 | Discharge: 2022-04-19 | Discharge status: Against Medical Advice | Payer: Medicaid Other | Source: Home / Self Care

## 2022-04-19 DIAGNOSIS — F1910 Other psychoactive substance abuse, uncomplicated: Secondary | ICD-10-CM

## 2022-04-19 DIAGNOSIS — Z5321 Procedure and treatment not carried out due to patient leaving prior to being seen by health care provider: Secondary | ICD-10-CM | POA: Insufficient documentation

## 2022-04-19 DIAGNOSIS — Z9151 Personal history of suicidal behavior: Secondary | ICD-10-CM | POA: Insufficient documentation

## 2022-04-19 DIAGNOSIS — G4730 Sleep apnea, unspecified: Secondary | ICD-10-CM | POA: Insufficient documentation

## 2022-04-19 DIAGNOSIS — F17210 Nicotine dependence, cigarettes, uncomplicated: Secondary | ICD-10-CM | POA: Insufficient documentation

## 2022-04-19 DIAGNOSIS — R45851 Suicidal ideations: Secondary | ICD-10-CM | POA: Insufficient documentation

## 2022-04-19 DIAGNOSIS — Z20822 Contact with and (suspected) exposure to covid-19: Secondary | ICD-10-CM | POA: Insufficient documentation

## 2022-04-19 DIAGNOSIS — M79605 Pain in left leg: Secondary | ICD-10-CM | POA: Insufficient documentation

## 2022-04-19 DIAGNOSIS — F200 Paranoid schizophrenia: Secondary | ICD-10-CM

## 2022-04-19 DIAGNOSIS — Z5900 Homelessness unspecified: Secondary | ICD-10-CM | POA: Insufficient documentation

## 2022-04-19 DIAGNOSIS — J45909 Unspecified asthma, uncomplicated: Secondary | ICD-10-CM | POA: Insufficient documentation

## 2022-04-19 DIAGNOSIS — M79662 Pain in left lower leg: Secondary | ICD-10-CM | POA: Insufficient documentation

## 2022-04-19 LAB — POCT URINE DRUG SCREEN - MANUAL ENTRY (I-SCREEN)
POC Amphetamine UR: POSITIVE — AB
POC Buprenorphine (BUP): NOT DETECTED
POC Cocaine UR: POSITIVE — AB
POC Marijuana UR: POSITIVE — AB
POC Methadone UR: NOT DETECTED
POC Methamphetamine UR: POSITIVE — AB
POC Morphine: NOT DETECTED
POC Oxazepam (BZO): POSITIVE — AB
POC Oxycodone UR: NOT DETECTED
POC Secobarbital (BAR): NOT DETECTED

## 2022-04-19 LAB — RESP PANEL BY RT-PCR (FLU A&B, COVID) ARPGX2
Influenza A by PCR: NEGATIVE
Influenza B by PCR: NEGATIVE
SARS Coronavirus 2 by RT PCR: NEGATIVE

## 2022-04-19 LAB — POC SARS CORONAVIRUS 2 AG: SARSCOV2ONAVIRUS 2 AG: NEGATIVE

## 2022-04-19 MED ORDER — ACETAMINOPHEN 325 MG PO TABS
650.0000 mg | ORAL_TABLET | Freq: Four times a day (QID) | ORAL | Status: DC | PRN
  Administered 2022-04-19: 650 mg via ORAL
  Filled 2022-04-19: qty 2

## 2022-04-19 MED ORDER — METHOCARBAMOL 500 MG PO TABS: 500.0000 mg | ORAL_TABLET | Freq: Three times a day (TID) | ORAL | Status: DC | PRN

## 2022-04-19 MED ORDER — THIAMINE HCL 100 MG PO TABS
100.0000 mg | ORAL_TABLET | Freq: Every day | ORAL | Status: DC
Start: 2022-04-20 — End: 2022-04-20
  Administered 2022-04-20: 100 mg via ORAL
  Filled 2022-04-19: qty 1

## 2022-04-19 MED ORDER — ALUM & MAG HYDROXIDE-SIMETH 200-200-20 MG/5ML PO SUSP
30.0000 mL | ORAL | Status: DC | PRN
  Administered 2022-04-19: 30 mL via ORAL
  Filled 2022-04-19: qty 30

## 2022-04-19 MED ORDER — METHOCARBAMOL 500 MG PO TABS
500.0000 mg | ORAL_TABLET | Freq: Three times a day (TID) | ORAL | Status: DC | PRN
  Filled 2022-04-19: qty 10

## 2022-04-19 MED ORDER — MAGNESIUM HYDROXIDE 400 MG/5ML PO SUSP: 30.0000 mL | Freq: Every day | ORAL | Status: DC | PRN

## 2022-04-19 MED ORDER — HYDROXYZINE HCL 25 MG PO TABS: 25.0000 mg | ORAL_TABLET | Freq: Four times a day (QID) | ORAL | Status: DC | PRN

## 2022-04-19 MED ORDER — LOPERAMIDE HCL 2 MG PO CAPS: 2.0000 mg | ORAL_CAPSULE | ORAL | Status: DC | PRN

## 2022-04-19 MED ORDER — OLANZAPINE 10 MG PO TBDP
10.0000 mg | ORAL_TABLET | Freq: Every day | ORAL | Status: DC
Start: 2022-04-19 — End: 2022-04-20
  Administered 2022-04-19: 10 mg via ORAL
  Filled 2022-04-19 (×2): qty 1
  Filled 2022-04-19: qty 14

## 2022-04-19 MED ORDER — DICYCLOMINE HCL 20 MG PO TABS: 20.0000 mg | ORAL_TABLET | Freq: Four times a day (QID) | ORAL | Status: DC | PRN

## 2022-04-19 MED ORDER — NAPROXEN 500 MG PO TABS: 500.0000 mg | ORAL_TABLET | Freq: Two times a day (BID) | ORAL | Status: DC | PRN

## 2022-04-19 MED ORDER — DICYCLOMINE HCL 20 MG PO TABS
20.0000 mg | ORAL_TABLET | Freq: Four times a day (QID) | ORAL | Status: DC | PRN
  Filled 2022-04-19: qty 10

## 2022-04-19 MED ORDER — LORAZEPAM 1 MG PO TABS: 1.0000 mg | ORAL_TABLET | Freq: Four times a day (QID) | ORAL | Status: DC | PRN

## 2022-04-19 MED ORDER — OLANZAPINE 10 MG PO TBDP
10.0000 mg | ORAL_TABLET | Freq: Every day | ORAL | Status: DC
Start: 2022-04-19 — End: 2022-04-19

## 2022-04-19 MED ORDER — ONDANSETRON 4 MG PO TBDP: 4.0000 mg | ORAL_TABLET | Freq: Four times a day (QID) | ORAL | Status: DC | PRN

## 2022-04-19 MED ORDER — THIAMINE HCL 100 MG/ML IJ SOLN
100.0000 mg | Freq: Once | INTRAMUSCULAR | Status: AC
Start: 2022-04-19 — End: 2022-04-19
  Administered 2022-04-19: 100 mg via INTRAMUSCULAR
  Filled 2022-04-19: qty 2

## 2022-04-19 MED ORDER — HYDROXYZINE HCL 25 MG PO TABS
25.0000 mg | ORAL_TABLET | Freq: Four times a day (QID) | ORAL | Status: DC | PRN
  Filled 2022-04-19: qty 10

## 2022-04-19 MED ORDER — ADULT MULTIVITAMIN W/MINERALS CH
1.0000 | ORAL_TABLET | Freq: Every day | ORAL | Status: DC
  Administered 2022-04-19 – 2022-04-20 (×2): 1 via ORAL
  Filled 2022-04-19 (×2): qty 1

## 2022-04-19 NOTE — Discharge Instructions (Addendum)
Your exam today was reassuring.  You will prescribe Robaxin earlier today for Dominic [name] long.  This is at your pharmacy.  Pick this up when the pharmacy opens.  He denies any thoughts of harming yourself or anyone else.  You have an admission plan at [name] for Thursday at 9 in the morning please follow-up at [name] at that time.
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
## 2022-04-19 NOTE — ED Triage Notes (Signed)
Patient reports left leg pain , seen here few hours ago for the same complaint - discharged home .

## 2022-04-19 NOTE — ED Provider Notes (Signed)
[facility] EMERGENCY DEPARTMENT Provider Note   CSN: [id number] Arrival date & time: 04/19/22  0051     History  Chief Complaint  Patient presents with   Leg Pain     Dominic [name] is a 46 y.o. male.  46 year old male presents today for evaluation of left lower extremity pain since being discharged from [facility] long emergency room.  He was at [facility] long emergency room for psychiatric evaluation and discharged today for admission at [facility] Thursday morning.  He plans on keeping that admission.  He denies suicidal ideations, homicidal ideations, or AVH tonight.  States he just wants something for his leg pain.  States he has been ambulating since discharge without improvement of his pain.  Has not taken anything prior to arrival.  He is also requesting a sandwich.  Of note patient frequently presents to the emergency room for various complaints including SI.  However as noted above he is without suicidal ideations tonight.  The history is provided by the patient. No language interpreter was used.      Home Medications Prior to Admission medications   Medication Sig Start Date End Date Taking? Authorizing Provider  ARIPiprazole ER (ABILIFY MAINTENA) 400 MG SRER injection Inject 2 mLs (400 mg total) into the muscle every 28 (twenty-eight) days. Next dose due May 11, 2022.  Follow up at [facility] shot clinic at 11:15 am.  Bring medication for injection with you Patient not taking: Reported on 04/14/2022 04/12/22   Rankin, Shuvon B, NP  dicyclomine (BENTYL) 20 MG tablet Take 1 tablet (20 mg total) by mouth every 6 (six) hours as needed for spasms (abdominal cramping). 04/18/22 05/18/22  Earney Navy, NP  hydrOXYzine (ATARAX) 25 MG tablet Take 1 tablet (25 mg total) by mouth every 6 (six) hours as needed for anxiety. 04/18/22   Earney Navy, NP  methocarbamol (ROBAXIN) 500 MG tablet Take 1 tablet (500 mg total) by mouth every 8  (eight) hours as needed for up to 15 days for muscle spasms. 04/18/22 05/03/22  Earney Navy, NP  OLANZapine zydis (ZYPREXA) 10 MG disintegrating tablet Take 1 tablet (10 mg total) by mouth daily. 04/19/22 05/19/22  Earney Navy, NP  traZODone (DESYREL) 50 MG tablet Take 1 tablet (50 mg total) by mouth at bedtime as needed for sleep. Patient not taking: Reported on 04/10/2022 02/17/22   Layla Barter, NP      Allergies    Patient has no known allergies.    Review of Systems   Review of Systems  Constitutional:  Negative for chills and fever.  Musculoskeletal:  Positive for myalgias. Negative for arthralgias and gait problem.  All other systems reviewed and are negative.  Physical Exam Updated Vital Signs BP 112/65 (BP Location: Right Arm)   Pulse (!) 57   Temp 98.2 F (36.8 C) (Oral)   Resp 17   SpO2 95%  Physical Exam Vitals and nursing note reviewed.  Constitutional:      General: He is not in acute distress.    Appearance: Normal appearance. He is not ill-appearing.  HENT:     Head: Normocephalic and atraumatic.     Nose: Nose normal.  Eyes:     Conjunctiva/sclera: Conjunctivae normal.  Pulmonary:     Effort: Pulmonary effort is normal. No respiratory distress.  Musculoskeletal:        General: No deformity.     Comments: Full range of motion in bilateral lower  extremities.  Without deformity, or other concerning findings.  He is able to ambulate without difficulty.  Skin:    Findings: No rash.  Neurological:     Mental Status: He is alert.    ED Results / Procedures / Treatments   Labs (all labs ordered are listed, but only abnormal results are displayed) Labs Reviewed - No data to display  EKG None  Radiology No results found.  Procedures Procedures    Medications Ordered in ED Medications - No data to display  ED Course/ Medical Decision Making/ A&P                           Medical Decision Making  46 year old male presents today for  evaluation of left lower extremity pain.  Discharged today from [place] long emergency room with plans to follow-up for admission at [facility] for state 9 AM.  He is without suicidal ideations, homicidal ideations, or AVH tonight.  He is well-appearing.  On review of his AVS that he has with him he was prescribed Robaxin this morning.  Reviewed and discussed this with the patient.  He did not pick up his medications today from his pharmacy however he is planning to do so today when his pharmacy opens.  Patient is appropriate for discharge.  Discharged in stable condition.  Return precautions discussed.  Discussed importance of keeping his appointment at [facility].  Of note patient presents to the emergency room frequently for various complaints.  He is requesting a sandwich.   Final Clinical Impression(s) / ED Diagnoses Final diagnoses:  Acute pain of left lower extremity    Rx / DC Orders ED Discharge Orders     None         Marita [place], PA-C 04/19/22 0125    Tilden Fossa, MD 04/19/22 [phone]

## 2022-04-19 NOTE — ED Notes (Signed)
Pt A&O x 4, presents with SI, plan to lay in traffic. Auditory hallucinations telling him he is not going to make it.  Denies HI.  Monitoring for safety.

## 2022-04-19 NOTE — ED Notes (Addendum)
Patient approached and found resting quietly in bed. He reports '' I'm doing okay today. I am supposed to be going to [employer]. '' Patient reports he continues to have auditory hallucination that states '' it tells me I'm not going to make it to my appointment tomorrow at [place]. '' Patient denies any other hallucinations and has not been observed responding to any internal stimuli. Patient states '' I was suicidal yesterday when I came in, I'm not now, really just focused on getting to [facility]. '' Patient states he is hopeful for residential substance use treatment. Reports he was previously on '' abilify for the voices''  Denies any Visual hallucinations. Patient is calm, appropriate. Muffin and juice and breakfast provided as well as toiletries for shower. Pt showered. He appears in no acute distress. Pt is safe, will con' t to monitor.

## 2022-04-19 NOTE — ED Notes (Signed)
Pt was given spaghetti and juice for lunch.  

## 2022-04-19 NOTE — ED Triage Notes (Signed)
Patient arrived with EMS from street reports left leg pain onset yesterday , denies injury/ambulatory .

## 2022-04-19 NOTE — ED Provider Notes (Signed)
Behavioral Health Progress Note  Date and Time: 04/19/2022 9:50 AM Name: Dominic [name] MRN:  [id number]  Subjective:  Pt is a 46 y.o. male [facility] for SI with a plan to lay down in traffic on the [place]. Pt was recently discharged from [facility] for similar complaints. Pt has a bed available at [facility] for Thursday at 9 am.   Pt is seen today. Pt states he is doing "all right " and his mood is still depressed and anxious. He didn't sleep well last night. Pt states his appetite is stable.  Currently, Pt endorses active suicidal ideation without a plan.  He reports multiple past suicidal attempts started showing his scars of cutting on his abdomen. He denies homicidal ideation.  He reports auditory hallucination of voices telling him to hurt himself. He reports that these voices are chronic but has been getting worse.  He denies any visual hallucinations today but usually sees spirits which go fast and dark shadows.  He reports that he uses ice (last use yesterday), fentanyl (last used over the weekend), crack cocaine (daily), marijuana (daily), Xanax  (3 times per week with alcohol ), Adderall (daily ) and alcohol  (on weekends ). He last drank half pint of liquor this past weekend.  He reports cravings but denies any withdrawal symptoms. He has never been to any substance abuse rehab. Pt denies any headache, nausea, vomiting, dizziness, chest pain, SOB, abdominal pain, diarrhea, and constipation. He reports pain in left leg. Pt states he has [facility].   Diagnosis:  Final diagnoses:  Suicidal ideations  Paranoid schizophrenia (HCC)  Polysubstance abuse (HCC)    Total Time spent with patient: 30 minutes  Past Psychiatric History: see H&P Past Medical History:  Past Medical History:  Diagnosis Date   Anxiety    Asthma    Bipolar 1 disorder (HCC)    Chronic hepatitis C without hepatic coma (HCC) 05/28/2018   Depression    Hepatitis  C    Hepatitis C antibody positive in blood 05/28/2018   Malingering 03/30/2022   [facility]iatry [facility] from [facility] states "directly admitted to fabricating SI and CAH to obtain [psychiatric] admission"   Schizophrenia (HCC)    Sleep apnea     Past Surgical History:  Procedure Laterality Date   CYST REMOVAL NECK     neck   Family History:  Family History  Problem Relation Age of Onset   Hypertension Sister    Schizophrenia Sister    Family Psychiatric  History: see H&P Social History:  Social History   Substance and Sexual Activity  Alcohol Use Yes   Alcohol/week: 3.0 standard drinks   Types: 3 Shots of liquor per week     Social History   Substance and Sexual Activity  Drug Use Yes   Types: Cocaine, Marijuana, Methamphetamines    Social History   Socioeconomic History   Marital status: Single    Spouse name: Not on file   Number of children: Not on file   Years of education: Not on file   Highest education level: Not on file  Occupational History   Not on file  Tobacco Use   Smoking status: Some Days    Packs/day: 1.00    Types: Cigarettes   Smokeless tobacco: Never  Vaping Use   Vaping Use: Never used  Substance and Sexual Activity   Alcohol use: Yes    Alcohol/week: 3.0 standard drinks  Types: 3 Shots of liquor per week   Drug use: Yes    Types: Cocaine, Marijuana, Methamphetamines   Sexual activity: Not Currently  Other Topics Concern   Not on file  Social History Narrative   Not on file   Social Determinants of Health   Financial Resource Strain: Not on file  Food Insecurity: Not on file  Transportation Needs: Not on file  Physical Activity: Not on file  Stress: Not on file  Social Connections: Not on file   SDOH:  SDOH Screenings   Alcohol Screen: Low Risk    Last Alcohol Screening Score (AUDIT): 2  Depression (PHQ2-9): Medium Risk   PHQ-2 Score: 19  Financial Resource Strain: Not on file  Food Insecurity: Not on file  Housing: Not on  file  Physical Activity: Not on file  Social Connections: Not on file  Stress: Not on file  Tobacco Use: High Risk   Smoking Tobacco Use: Some Days   Smokeless Tobacco Use: Never   Passive Exposure: Not on file  Transportation Needs: Not on file   Additional Social History:    Pain Medications: See MAR Prescriptions: See MAR Over the Counter: See MAR History of alcohol / drug use?: Yes Longest period of sobriety (when/how long): Unknown Negative Consequences of Use: Legal, Financial, Personal relationships Withdrawal Symptoms: None (Pt denies) Name of Substance 1: Crack cocaine 1 - Age of First Use: Unknown 1 - Amount (size/oz): Typically 1 gram 1 - Frequency: Daily 1 - Duration: Ongoing 1 - Last Use / Amount: Today pt used $20 1 - Method of Aquiring: Unknown 1- Route of Use: Smoke Name of Substance 2: Methamphetamine 2 - Age of First Use: Unknown 2 - Amount (size/oz): 1 gram 2 - Frequency: 2-3x/week 2 - Duration: Ongoing 2 - Last Use / Amount: 2-3 days ago 2 - Method of Aquiring: Unknown 2 - Route of Substance Use: Injects and smokes Name of Substance 3: THC 3 - Age of First Use: Unknown 3 - Amount (size/oz): 2-3 blunts 3 - Frequency: Daily 3 - Duration: Ongoing 3 - Last Use / Amount: 2-3 blunts today 3 - Method of Aquiring: Unknown 3 - Route of Substance Use: Smoke Name of Substance 4: Benzodiazepines 4 - Age of First Use: Unknown 4 - Amount (size/oz): Pt is unsure - he reports he took 1 unknown pill of an unknown amount 4 - Frequency: 2-3 times/week 4 - Duration: Ongoing 4 - Last Use / Amount: 2-3 days ago 4 - Method of Aquiring: Unknown 4 - Route of Substance Use: Swallow            Sleep: Poor  Appetite:  Good  Current Medications:  Current Facility-Administered Medications  Medication Dose Route Frequency Provider Last Rate Last Admin   acetaminophen (TYLENOL) tablet 650 mg  650 mg Oral Q6H PRN Onuoha, Chinwendu V, NP       alum & mag  hydroxide-simeth (MAALOX/MYLANTA) 200-200-20 MG/5ML suspension 30 mL  30 mL Oral Q4H PRN Onuoha, Chinwendu V, NP       dicyclomine (BENTYL) tablet 20 mg  20 mg Oral Q6H PRN Nira Conn A, NP       hydrOXYzine (ATARAX) tablet 25 mg  25 mg Oral Q6H PRN Nira Conn A, NP       loperamide (IMODIUM) capsule 2-4 mg  2-4 mg Oral PRN Loye Vento, MD       LORazepam (ATIVAN) tablet 1 mg  1 mg Oral Q6H PRN Karsten Ro, MD  magnesium hydroxide (MILK OF MAGNESIA) suspension 30 mL  30 mL Oral Daily PRN Onuoha, Chinwendu V, NP       methocarbamol (ROBAXIN) tablet 500 mg  500 mg Oral Q8H PRN Jackelyn Poling, NP       multivitamin with minerals tablet 1 tablet  1 tablet Oral Daily Hamna Asa, MD       naproxen (NAPROSYN) tablet 500 mg  500 mg Oral BID PRN Karsten Ro, MD       OLANZapine zydis (ZYPREXA) disintegrating tablet 10 mg  10 mg Oral QHS Nira Conn A, NP       ondansetron (ZOFRAN-ODT) disintegrating tablet 4 mg  4 mg Oral Q6H PRN Karsten Ro, MD       thiamine (B-1) injection 100 mg  100 mg Intramuscular Once Karsten Ro, MD       [START ON 04/20/2022] thiamine tablet 100 mg  100 mg Oral Daily Jeramia Saleeby, Traci Sermon, MD       Current Outpatient Medications  Medication Sig Dispense Refill   ARIPiprazole (ABILIFY) 10 MG tablet Take 10 mg by mouth daily.     ARIPiprazole ER (ABILIFY MAINTENA) 400 MG SRER injection Inject 2 mLs (400 mg total) into the muscle every 28 (twenty-eight) days. Next dose due May 11, 2022.  Follow up at [facility] shot clinic at 11:15 am.  Bring medication for injection with you 1 each 1   dicyclomine (BENTYL) 20 MG tablet Take 1 tablet (20 mg total) by mouth every 6 (six) hours as needed for spasms (abdominal cramping). 45 tablet 0   hydrOXYzine (ATARAX) 25 MG tablet Take 1 tablet (25 mg total) by mouth every 6 (six) hours as needed for anxiety. 30 tablet 0   methocarbamol (ROBAXIN) 500 MG tablet Take 1 tablet (500 mg total) by mouth every  8 (eight) hours as needed for up to 15 days for muscle spasms. 45 tablet 0   OLANZapine zydis (ZYPREXA) 10 MG disintegrating tablet Take 1 tablet (10 mg total) by mouth daily. 30 tablet 0   traZODone (DESYREL) 50 MG tablet Take 1 tablet (50 mg total) by mouth at bedtime as needed for sleep. (Patient taking differently: Take 50 mg by mouth at bedtime.) 30 tablet 0    Labs  Lab Results:  Admission on 04/19/2022  Component Date Value Ref Range Status   SARS Coronavirus 2 by RT PCR 04/19/2022 NEGATIVE  NEGATIVE Final   Comment: (NOTE) SARS-CoV-2 target nucleic acids are NOT DETECTED.  The SARS-CoV-2 RNA is generally detectable in upper respiratory specimens during the acute phase of infection. The lowest concentration of SARS-CoV-2 viral copies this assay can detect is 138 copies/mL. A negative result does not preclude SARS-Cov-2 infection and should not be used as the sole basis for treatment or other patient management decisions. A negative result may occur with  improper specimen collection/handling, submission of specimen other than nasopharyngeal swab, presence of viral mutation(s) within the areas targeted by this assay, and inadequate number of viral copies(<138 copies/mL). A negative result must be combined with clinical observations, patient history, and epidemiological information. The expected result is Negative.  Fact Sheet for Patients:  BloggerCourse.com  Fact Sheet for Healthcare Providers:  SeriousBroker.it  This test is no                          t yet approved or cleared by the [place] FDA and  has been authorized for detection and/or  diagnosis of SARS-CoV-2 by FDA under an Emergency Use Authorization (EUA). This EUA will remain  in effect (meaning this test can be used) for the duration of the COVID-19 declaration under Section 564(b)(1) of the Act, 21 U.S.C.section 360bbb-3(b)(1), unless the authorization is  terminated  or revoked sooner.       Influenza A by PCR 04/19/2022 NEGATIVE  NEGATIVE Final   Influenza B by PCR 04/19/2022 NEGATIVE  NEGATIVE Final   Comment: (NOTE) The Xpert Xpress SARS-CoV-2/FLU/RSV plus assay is intended as an aid in the diagnosis of influenza from Nasopharyngeal swab specimens and should not be used as a sole basis for treatment. Nasal washings and aspirates are unacceptable for Xpert Xpress SARS-CoV-2/FLU/RSV testing.  Fact Sheet for Patients: BloggerCourse.com  Fact Sheet for Healthcare Providers: SeriousBroker.it  This test is not yet approved or cleared by the [place] FDA and has been authorized for detection and/or diagnosis of SARS-CoV-2 by FDA under an Emergency Use Authorization (EUA). This EUA will remain in effect (meaning this test can be used) for the duration of the COVID-19 declaration under Section 564(b)(1) of the Act, 21 U.S.C. section 360bbb-3(b)(1), unless the authorization is terminated or revoked.  Performed at [facility], [address] [place], [place] [address]    POC Amphetamine UR 04/19/2022 Positive (A)  NONE DETECTED (Cut Off Level 1000 ng/mL) Final   POC Secobarbital (BAR) 04/19/2022 None Detected  NONE DETECTED (Cut Off Level 300 ng/mL) Final   POC Buprenorphine (BUP) 04/19/2022 None Detected  NONE DETECTED (Cut Off Level 10 ng/mL) Final   POC Oxazepam (BZO) 04/19/2022 Positive (A)  NONE DETECTED (Cut Off Level 300 ng/mL) Final   POC Cocaine UR 04/19/2022 Positive (A)  NONE DETECTED (Cut Off Level 300 ng/mL) Final   POC Methamphetamine UR 04/19/2022 Positive (A)  NONE DETECTED (Cut Off Level 1000 ng/mL) Final   POC Morphine 04/19/2022 None Detected  NONE DETECTED (Cut Off Level 300 ng/mL) Final   POC Methadone UR 04/19/2022 None Detected  NONE DETECTED (Cut Off Level 300 ng/mL) Final   POC Oxycodone UR 04/19/2022 None Detected  NONE DETECTED (Cut Off Level 100  ng/mL) Final   POC Marijuana UR 04/19/2022 Positive (A)  NONE DETECTED (Cut Off Level 50 ng/mL) Final   SARSCOV2ONAVIRUS 2 AG 04/19/2022 NEGATIVE  NEGATIVE Final   Comment: (NOTE) SARS-CoV-2 antigen NOT DETECTED.   Negative results are presumptive.  Negative results do not preclude SARS-CoV-2 infection and should not be used as the sole basis for treatment or other patient management decisions, including infection  control decisions, particularly in the presence of clinical signs and  symptoms consistent with COVID-19, or in those who have been in contact with the virus.  Negative results must be combined with clinical observations, patient history, and epidemiological information. The expected result is Negative.  Fact Sheet for Patients: https://www.jennings-kim.com/  Fact Sheet for Healthcare Providers: https://alexander-rogers.biz/  This test is not yet approved or cleared by the [place] FDA and  has been authorized for detection and/or diagnosis of SARS-CoV-2 by FDA under an Emergency Use Authorization (EUA).  This EUA will remain in effect (meaning this test can be used) for the duration of  the COV                          ID-19 declaration under Section 564(b)(1) of the Act, 21 U.S.C. section 360bbb-3(b)(1), unless the authorization is terminated or revoked sooner.    Admission on  04/16/2022, Discharged on 04/18/2022  Component Date Value Ref Range Status   WBC 04/16/2022 7.5  4.0 - 10.5 K/uL Final   RBC 04/16/2022 4.60  4.22 - 5.81 MIL/uL Final   Hemoglobin 04/16/2022 13.5  13.0 - 17.0 g/dL Final   HCT 82/50/5397 37.8 (L)  39.0 - 52.0 % Final   MCV 04/16/2022 82.2  80.0 - 100.0 fL Final   MCH 04/16/2022 29.3  26.0 - 34.0 pg Final   MCHC 04/16/2022 35.7  30.0 - 36.0 g/dL Final   RDW 67/34/1937 14.6  11.5 - 15.5 % Final   Platelets 04/16/2022 228  150 - 400 K/uL Final   nRBC 04/16/2022 0.0  0.0 - 0.2 % Final   Neutrophils Relative %  04/16/2022 43  % Final   Neutro Abs 04/16/2022 3.2  1.7 - 7.7 K/uL Final   Lymphocytes Relative 04/16/2022 43  % Final   Lymphs Abs 04/16/2022 3.3  0.7 - 4.0 K/uL Final   Monocytes Relative 04/16/2022 11  % Final   Monocytes Absolute 04/16/2022 0.8  0.1 - 1.0 K/uL Final   Eosinophils Relative 04/16/2022 2  % Final   Eosinophils Absolute 04/16/2022 0.1  0.0 - 0.5 K/uL Final   Basophils Relative 04/16/2022 1  % Final   Basophils Absolute 04/16/2022 0.0  0.0 - 0.1 K/uL Final   Immature Granulocytes 04/16/2022 0  % Final   Abs Immature Granulocytes 04/16/2022 0.01  0.00 - 0.07 K/uL Final   Performed at [facility], [address], [place], [place] [address]   Sodium 04/16/2022 139  135 - 145 mmol/L Final   Potassium 04/16/2022 3.9  3.5 - 5.1 mmol/L Final   Chloride 04/16/2022 108  98 - 111 mmol/L Final   CO2 04/16/2022 26  22 - 32 mmol/L Final   Glucose, Bld 04/16/2022 136 (H)  70 - 99 mg/dL Final   Glucose reference range applies only to samples taken after fasting for at least 8 hours.   BUN 04/16/2022 17  6 - 20 mg/dL Final   Creatinine, Ser 04/16/2022 0.75  0.61 - 1.24 mg/dL Final   Calcium 97/35/3299 8.9  8.9 - 10.3 mg/dL Final   GFR, Estimated 04/16/2022 >60  >60 mL/min Final   Comment: (NOTE) Calculated using the CKD-EPI Creatinine Equation (2021)    Anion gap 04/16/2022 5  5 - 15 Final   Performed at [facility], [address] [place], [place] [address]   Salicylate Lvl 04/16/2022 <7.0 (L)  7.0 - 30.0 mg/dL Final   Performed at [facility], [address] [place], [place] [address]   Acetaminophen (Tylenol), Serum 04/16/2022 <10 (L)  10 - 30 ug/mL Final   Comment: (NOTE) Therapeutic concentrations vary significantly. A range of 10-30 ug/mL  may be an effective concentration for many patients. However, some  are best treated at concentrations outside of this range. Acetaminophen concentrations >150 ug/mL at 4 hours  after ingestion  and >50 ug/mL at 12 hours after ingestion are often associated with  toxic reactions.  Performed at [facility], [address], [place], [place] [address]    Alcohol, Ethyl (B) 04/16/2022 <10  <10 mg/dL Final   Comment: (NOTE) Lowest detectable limit for serum alcohol is 10 mg/dL.  For medical purposes only. Performed at [facility], [address], [place], [place] [address]    Opiates 04/16/2022 NONE DETECTED  NONE DETECTED Final   Cocaine 04/16/2022 POSITIVE (A)  NONE  DETECTED Final   Benzodiazepines 04/16/2022 POSITIVE (A)  NONE DETECTED Final   Amphetamines 04/16/2022 POSITIVE (A)  NONE DETECTED Final   Tetrahydrocannabinol 04/16/2022 POSITIVE (A)  NONE DETECTED Final   Barbiturates 04/16/2022 NONE DETECTED  NONE DETECTED Final   Comment: (NOTE) DRUG SCREEN FOR MEDICAL PURPOSES ONLY.  IF CONFIRMATION IS NEEDED FOR ANY PURPOSE, NOTIFY LAB WITHIN 5 DAYS.  LOWEST DETECTABLE LIMITS FOR URINE DRUG SCREEN Drug Class                     Cutoff (ng/mL) Amphetamine and metabolites    1000 Barbiturate and metabolites    200 Benzodiazepine                 200 Tricyclics and metabolites     300 Opiates and metabolites        300 Cocaine and metabolites        300 THC                            50 Performed at [facility], [address] [place], [place] [address]   Admission on 04/15/2022, Discharged on 04/15/2022  Component Date Value Ref Range Status   Sodium 04/15/2022 133 (L)  135 - 145 mmol/L Final   Potassium 04/15/2022 4.0  3.5 - 5.1 mmol/L Final   Chloride 04/15/2022 103  98 - 111 mmol/L Final   CO2 04/15/2022 21 (L)  22 - 32 mmol/L Final   Glucose, Bld 04/15/2022 88  70 - 99 mg/dL Final   Glucose reference range applies only to samples taken after fasting for at least 8 hours.   BUN 04/15/2022 13  6 - 20 mg/dL Final   Creatinine, Ser 04/15/2022 0.95  0.61 - 1.24 mg/dL Final   Calcium  60/45/4098 8.7 (L)  8.9 - 10.3 mg/dL Final   Total Protein 11/91/4782 7.8  6.5 - 8.1 g/dL Final   Albumin 95/62/1308 3.9  3.5 - 5.0 g/dL Final   AST 65/78/4696 32  15 - 41 U/L Final   ALT 04/15/2022 35  0 - 44 U/L Final   Alkaline Phosphatase 04/15/2022 36 (L)  38 - 126 U/L Final   Total Bilirubin 04/15/2022 0.9  0.3 - 1.2 mg/dL Final   GFR, Estimated 04/15/2022 >60  >60 mL/min Final   Comment: (NOTE) Calculated using the CKD-EPI Creatinine Equation (2021)    Anion gap 04/15/2022 9  5 - 15 Final   Performed at [facility], [address] [place], [place] [address]   Alcohol, Ethyl (B) 04/15/2022 <10  <10 mg/dL Final   Comment: (NOTE) Lowest detectable limit for serum alcohol is 10 mg/dL.  For medical purposes only. Performed at [facility], [address], [place], [place] [address]    WBC 04/15/2022 10.8 (H)  4.0 - 10.5 K/uL Final   RBC 04/15/2022 4.74  4.22 - 5.81 MIL/uL Final   Hemoglobin 04/15/2022 14.1  13.0 - 17.0 g/dL Final   HCT 40/08/2724 38.6 (L)  39.0 - 52.0 % Final   MCV 04/15/2022 81.4  80.0 - 100.0 fL Final   MCH 04/15/2022 29.7  26.0 - 34.0 pg Final   MCHC 04/15/2022 36.5 (H)  30.0 - 36.0 g/dL Final   RDW 36/64/4034 14.4  11.5 - 15.5 % Final   Platelets 04/15/2022 237  150 - 400 K/uL Final   nRBC 04/15/2022 0.0  0.0 - 0.2 %  Final   Performed at [facility], [address] [place], [place] [address]  Admission on 04/14/2022, Discharged on 04/14/2022  Component Date Value Ref Range Status   Sodium 04/14/2022 139  135 - 145 mmol/L Final   Potassium 04/14/2022 4.3  3.5 - 5.1 mmol/L Final   Chloride 04/14/2022 107  98 - 111 mmol/L Final   CO2 04/14/2022 25  22 - 32 mmol/L Final   Glucose, Bld 04/14/2022 100 (H)  70 - 99 mg/dL Final   Glucose reference range applies only to samples taken after fasting for at least 8 hours.   BUN 04/14/2022 15  6 - 20 mg/dL Final   Creatinine, Ser 04/14/2022 0.80  0.61 - 1.24 mg/dL Final   Calcium 40/34/7425  9.3  8.9 - 10.3 mg/dL Final   GFR, Estimated 04/14/2022 >60  >60 mL/min Final   Comment: (NOTE) Calculated using the CKD-EPI Creatinine Equation (2021)    Anion gap 04/14/2022 7  5 - 15 Final   Performed at [facility], [address] [place], [place] [address]   WBC 04/14/2022 18.3 (H)  4.0 - 10.5 K/uL Final   RBC 04/14/2022 4.85  4.22 - 5.81 MIL/uL Final   Hemoglobin 04/14/2022 14.2  13.0 - 17.0 g/dL Final   HCT 75/64/3329 39.7  39.0 - 52.0 % Final   MCV 04/14/2022 81.9  80.0 - 100.0 fL Final   MCH 04/14/2022 29.3  26.0 - 34.0 pg Final   MCHC 04/14/2022 35.8  30.0 - 36.0 g/dL Final   RDW 51/88/4166 14.6  11.5 - 15.5 % Final   Platelets 04/14/2022 235  150 - 400 K/uL Final   nRBC 04/14/2022 0.0  0.0 - 0.2 % Final   Performed at [facility], [address] [place], [place] [address]   Troponin I (High Sensitivity) 04/14/2022 5  <18 ng/L Final   Comment: (NOTE) Elevated high sensitivity troponin I (hsTnI) values and significant  changes across serial measurements may suggest ACS but many other  chronic and acute conditions are known to elevate hsTnI results.  Refer to the "Links" section for chest pain algorithms and additional  guidance. Performed at [facility], [address] [place], [place] [address]    Total CK 04/14/2022 344  49 - 397 U/L Final   Performed at [facility], [address], [place], [place] [address]   Troponin I (High Sensitivity) 04/14/2022 4  <18 ng/L Final   Comment: (NOTE) Elevated high sensitivity troponin I (hsTnI) values and significant  changes across serial measurements may suggest ACS but many other  chronic and acute conditions are known to elevate hsTnI results.  Refer to the "Links" section for chest pain algorithms and additional  guidance. Performed at [facility], [address], [place], [place] [address]    SARS Coronavirus 2 by RT PCR  04/14/2022 NEGATIVE  NEGATIVE Final   Comment: (NOTE) SARS-CoV-2 target nucleic acids are NOT DETECTED.  The SARS-CoV-2 RNA is generally detectable in upper respiratory specimens during the acute phase of infection. The lowest concentration of SARS-CoV-2 viral copies this assay can detect is 138 copies/mL. A negative result does not preclude SARS-Cov-2 infection and should not be used as the sole basis for treatment or other patient management decisions. A negative result may occur with  improper specimen collection/handling, submission of specimen other than nasopharyngeal swab, presence of viral mutation(s) within the areas targeted by this assay, and inadequate  number of viral copies(<138 copies/mL). A negative result must be combined with clinical observations, patient history, and epidemiological information. The expected result is Negative.  Fact Sheet for Patients:  BloggerCourse.com  Fact Sheet for Healthcare Providers:  SeriousBroker.it  This test is no                          t yet approved or cleared by the [place] FDA and  has been authorized for detection and/or diagnosis of SARS-CoV-2 by FDA under an Emergency Use Authorization (EUA). This EUA will remain  in effect (meaning this test can be used) for the duration of the COVID-19 declaration under Section 564(b)(1) of the Act, 21 U.S.C.section 360bbb-3(b)(1), unless the authorization is terminated  or revoked sooner.       Influenza A by PCR 04/14/2022 NEGATIVE  NEGATIVE Final   Influenza B by PCR 04/14/2022 NEGATIVE  NEGATIVE Final   Comment: (NOTE) The Xpert Xpress SARS-CoV-2/FLU/RSV plus assay is intended as an aid in the diagnosis of influenza from Nasopharyngeal swab specimens and should not be used as a sole basis for treatment. Nasal washings and aspirates are unacceptable for Xpert Xpress SARS-CoV-2/FLU/RSV testing.  Fact Sheet for  Patients: BloggerCourse.com  Fact Sheet for Healthcare Providers: SeriousBroker.it  This test is not yet approved or cleared by the [place] FDA and has been authorized for detection and/or diagnosis of SARS-CoV-2 by FDA under an Emergency Use Authorization (EUA). This EUA will remain in effect (meaning this test can be used) for the duration of the COVID-19 declaration under Section 564(b)(1) of the Act, 21 U.S.C. section 360bbb-3(b)(1), unless the authorization is terminated or revoked.  Performed at [facility], [address], [place], [place] [address]    Alcohol, Ethyl (B) 04/14/2022 <10  <10 mg/dL Final   Comment: (NOTE) Lowest detectable limit for serum alcohol is 10 mg/dL.  For medical purposes only. Performed at [facility], [address] [address], [place], [place] [address]    Opiates 04/14/2022 NONE DETECTED  NONE DETECTED Final   Cocaine 04/14/2022 POSITIVE (A)  NONE DETECTED Final   Benzodiazepines 04/14/2022 POSITIVE (A)  NONE DETECTED Final   Amphetamines 04/14/2022 POSITIVE (A)  NONE DETECTED Final   Tetrahydrocannabinol 04/14/2022 POSITIVE (A)  NONE DETECTED Final   Barbiturates 04/14/2022 NONE DETECTED  NONE DETECTED Final   Comment: (NOTE) DRUG SCREEN FOR MEDICAL PURPOSES ONLY.  IF CONFIRMATION IS NEEDED FOR ANY PURPOSE, NOTIFY LAB WITHIN 5 DAYS.  LOWEST DETECTABLE LIMITS FOR URINE DRUG SCREEN Drug Class                     Cutoff (ng/mL) Amphetamine and metabolites    1000 Barbiturate and metabolites    200 Benzodiazepine                 200 Tricyclics and metabolites     300 Opiates and metabolites        300 Cocaine and metabolites        300 THC                            50 Performed at [facility], [address], [place], [place] [address]    Salicylate Lvl 04/14/2022 <7.0 (L)  7.0 - 30.0 mg/dL Final   Performed at [facility], [address], [place], [place] [address]   Acetaminophen (  Tylenol), Serum 04/14/2022 <10 (L)  10 - 30 ug/mL Final   Comment: (NOTE) Therapeutic concentrations vary significantly. A range of 10-30 ug/mL  may be an effective concentration for many patients. However, some  are best treated at concentrations outside of this range. Acetaminophen concentrations >150 ug/mL at 4 hours after ingestion  and >50 ug/mL at 12 hours after ingestion are often associated with  toxic reactions.  Performed at [facility], [address] [place], [place] [address]    Total Protein 04/14/2022 8.8 (H)  6.5 - 8.1 g/dL Final   Albumin 27/25/3664 4.4  3.5 - 5.0 g/dL Final   AST 40/34/7425 36  15 - 41 U/L Final   ALT 04/14/2022 40  0 - 44 U/L Final   Alkaline Phosphatase 04/14/2022 39  38 - 126 U/L Final   Total Bilirubin 04/14/2022 1.7 (H)  0.3 - 1.2 mg/dL Final   Bilirubin, Direct 04/14/2022 0.3 (H)  0.0 - 0.2 mg/dL Final   Indirect Bilirubin 04/14/2022 1.4 (H)  0.3 - 0.9 mg/dL Final   Performed at [facility], [address], [place], [place] [address]  Admission on 04/11/2022, Discharged on 04/12/2022  Component Date Value Ref Range Status   Sodium 04/11/2022 137  135 - 145 mmol/L Final   Potassium 04/11/2022 4.0  3.5 - 5.1 mmol/L Final   Chloride 04/11/2022 105  98 - 111 mmol/L Final   CO2 04/11/2022 25  22 - 32 mmol/L Final   Glucose, Bld 04/11/2022 83  70 - 99 mg/dL Final   Glucose reference range applies only to samples taken after fasting for at least 8 hours.   BUN 04/11/2022 7  6 - 20 mg/dL Final   Creatinine, Ser 04/11/2022 0.76  0.61 - 1.24 mg/dL Final   Calcium 75/64/3329 8.8 (L)  8.9 - 10.3 mg/dL Final   Total Protein 51/88/4166 7.2  6.5 - 8.1 g/dL Final   Albumin 05/12/1600 3.8  3.5 - 5.0 g/dL Final   AST 09/32/3557 41  15 - 41 U/L Final   ALT 04/11/2022 48 (H)  0 - 44 U/L Final   Alkaline Phosphatase 04/11/2022 34 (L)  38 -  126 U/L Final   Total Bilirubin 04/11/2022 0.7  0.3 - 1.2 mg/dL Final   GFR, Estimated 04/11/2022 >60  >60 mL/min Final   Comment: (NOTE) Calculated using the CKD-EPI Creatinine Equation (2021)    Anion gap 04/11/2022 7  5 - 15 Final   Performed at [facility], [address], [place], [place] [address]   Alcohol, Ethyl (B) 04/11/2022 <10  <10 mg/dL Final   Comment: (NOTE) Lowest detectable limit for serum alcohol is 10 mg/dL.  For medical purposes only. Performed at [facility], [address] [place], [place] [address]    Salicylate Lvl 04/11/2022 <7.0 (L)  7.0 - 30.0 mg/dL Final   Performed at [facility], [address], [place], [place] [address]   Acetaminophen (Tylenol), Serum 04/11/2022 <10 (L)  10 - 30 ug/mL Final   Comment: (NOTE) Therapeutic concentrations vary significantly. A range of 10-30 ug/mL  may be an effective concentration for many patients. However, some  are best treated at concentrations outside of this range. Acetaminophen concentrations >150 ug/mL at 4 hours after ingestion  and >50 ug/mL at 12 hours after ingestion are often associated with  toxic reactions.  Performed at [facility], [address], [place], [place] [address]  WBC 04/11/2022 9.1  4.0 - 10.5 K/uL Final   RBC 04/11/2022 4.63  4.22 - 5.81 MIL/uL Final   Hemoglobin 04/11/2022 13.8  13.0 - 17.0 g/dL Final   HCT 40/98/1191 37.7 (L)  39.0 - 52.0 % Final   MCV 04/11/2022 81.4  80.0 - 100.0 fL Final   MCH 04/11/2022 29.8  26.0 - 34.0 pg Final   MCHC 04/11/2022 36.6 (H)  30.0 - 36.0 g/dL Final   RDW 47/82/9562 14.6  11.5 - 15.5 % Final   Platelets 04/11/2022 213  150 - 400 K/uL Final   nRBC 04/11/2022 0.0  0.0 - 0.2 % Final   Performed at [facility], [address], [place], [place] [address]   Opiates 04/11/2022 NONE DETECTED  NONE DETECTED Final   Cocaine 04/11/2022 POSITIVE (A)  NONE DETECTED Final   Benzodiazepines 04/11/2022 POSITIVE (A)  NONE  DETECTED Final   Amphetamines 04/11/2022 NONE DETECTED  NONE DETECTED Final   Tetrahydrocannabinol 04/11/2022 POSITIVE (A)  NONE DETECTED Final   Barbiturates 04/11/2022 NONE DETECTED  NONE DETECTED Final   Comment: (NOTE) DRUG SCREEN FOR MEDICAL PURPOSES ONLY.  IF CONFIRMATION IS NEEDED FOR ANY PURPOSE, NOTIFY LAB WITHIN 5 DAYS.  LOWEST DETECTABLE LIMITS FOR URINE DRUG SCREEN Drug Class                     Cutoff (ng/mL) Amphetamine and metabolites    1000 Barbiturate and metabolites    200 Benzodiazepine                 200 Tricyclics and metabolites     300 Opiates and metabolites        300 Cocaine and metabolites        300 THC                            50 Performed at [facility], [address] [place], [place] [address]   Admission on 04/10/2022, Discharged on 04/10/2022  Component Date Value Ref Range Status   Sodium 04/10/2022 137  135 - 145 mmol/L Final   Potassium 04/10/2022 3.5  3.5 - 5.1 mmol/L Final   Chloride 04/10/2022 106  98 - 111 mmol/L Final   CO2 04/10/2022 23  22 - 32 mmol/L Final   Glucose, Bld 04/10/2022 98  70 - 99 mg/dL Final   Glucose reference range applies only to samples taken after fasting for at least 8 hours.   BUN 04/10/2022 14  6 - 20 mg/dL Final   Creatinine, Ser 04/10/2022 0.72  0.61 - 1.24 mg/dL Final   Calcium 96/29/5284 8.8 (L)  8.9 - 10.3 mg/dL Final   Total Protein 13/24/4010 8.2 (H)  6.5 - 8.1 g/dL Final   Albumin 27/25/3664 4.2  3.5 - 5.0 g/dL Final   AST 40/34/7425 78 (H)  15 - 41 U/L Final   ALT 04/10/2022 66 (H)  0 - 44 U/L Final   Alkaline Phosphatase 04/10/2022 42  38 - 126 U/L Final   Total Bilirubin 04/10/2022 1.6 (H)  0.3 - 1.2 mg/dL Final   GFR, Estimated 04/10/2022 >60  >60 mL/min Final   Comment: (NOTE) Calculated using the CKD-EPI Creatinine Equation (2021)    Anion gap 04/10/2022 8  5 - 15 Final   Performed at [facility], [address], [place], [place] [address]   Alcohol, Ethyl (B)  04/10/2022 <10  <10 mg/dL Final  Comment: (NOTE) Lowest detectable limit for serum alcohol is 10 mg/dL.  For medical purposes only. Performed at [facility], [address] [place], [place] [address]    Salicylate Lvl 04/10/2022 <7.0 (L)  7.0 - 30.0 mg/dL Final   Performed at [facility], [address] [place], [place] [address]   Acetaminophen (Tylenol), Serum 04/10/2022 <10 (L)  10 - 30 ug/mL Final   Comment: (NOTE) Therapeutic concentrations vary significantly. A range of 10-30 ug/mL  may be an effective concentration for many patients. However, some  are best treated at concentrations outside of this range. Acetaminophen concentrations >150 ug/mL at 4 hours after ingestion  and >50 ug/mL at 12 hours after ingestion are often associated with  toxic reactions.  Performed at [facility], [address] [place], [place] [address]    WBC 04/10/2022 9.9  4.0 - 10.5 K/uL Final   RBC 04/10/2022 4.91  4.22 - 5.81 MIL/uL Final   Hemoglobin 04/10/2022 14.2  13.0 - 17.0 g/dL Final   HCT 91/47/8295 39.6  39.0 - 52.0 % Final   MCV 04/10/2022 80.7  80.0 - 100.0 fL Final   MCH 04/10/2022 28.9  26.0 - 34.0 pg Final   MCHC 04/10/2022 35.9  30.0 - 36.0 g/dL Final   RDW 62/13/0865 14.2  11.5 - 15.5 % Final   Platelets 04/10/2022 215  150 - 400 K/uL Final   nRBC 04/10/2022 0.0  0.0 - 0.2 % Final   Performed at [facility], [address] [place], [place] [address]   Tricyclic, Ur Screen 04/10/2022 NONE DETECTED  NONE DETECTED Final   Amphetamines, Ur Screen 04/10/2022 NONE DETECTED  NONE DETECTED Final   MDMA (Ecstasy)Ur Screen 04/10/2022 NONE DETECTED  NONE DETECTED Final   Cocaine Metabolite,Ur [place] 04/10/2022 POSITIVE (A)  NONE DETECTED Final   Opiate, Ur Screen 04/10/2022 NONE DETECTED  NONE DETECTED Final   Phencyclidine (PCP) Ur S 04/10/2022 NONE DETECTED  NONE DETECTED Final   Cannabinoid 50 Ng, Ur [place] 04/10/2022 POSITIVE (A)  NONE DETECTED  Final   Barbiturates, Ur Screen 04/10/2022 NONE DETECTED  NONE DETECTED Final   Benzodiazepine, Ur Scrn 04/10/2022 POSITIVE (A)  NONE DETECTED Final   Methadone Scn, Ur 04/10/2022 NONE DETECTED  NONE DETECTED Final   Comment: (NOTE) Tricyclics + metabolites, urine    Cutoff 1000 ng/mL Amphetamines + metabolites, urine  Cutoff 1000 ng/mL MDMA (Ecstasy), urine              Cutoff 500 ng/mL Cocaine Metabolite, urine          Cutoff 300 ng/mL Opiate + metabolites, urine        Cutoff 300 ng/mL Phencyclidine (PCP), urine         Cutoff 25 ng/mL Cannabinoid, urine                 Cutoff 50 ng/mL Barbiturates + metabolites, urine  Cutoff 200 ng/mL Benzodiazepine, urine              Cutoff 200 ng/mL Methadone, urine                   Cutoff 300 ng/mL  The urine drug screen provides only a preliminary, unconfirmed analytical test result and should not be used for non-medical purposes. Clinical consideration and professional judgment should be applied to any positive drug screen result due to possible interfering substances. A more specific alternate chemical method must be used in order to obtain a confirmed  analytical result. Gas chromatography / mass spectrometry (GC/MS) is the preferred confirm                          atory method. Performed at [facility], [address] [place], [place] [address]   Admission on 04/09/2022, Discharged on 04/09/2022  Component Date Value Ref Range Status   Sodium 04/09/2022 140  135 - 145 mmol/L Final   Potassium 04/09/2022 4.2  3.5 - 5.1 mmol/L Final   Chloride 04/09/2022 108  98 - 111 mmol/L Final   CO2 04/09/2022 24  22 - 32 mmol/L Final   Glucose, Bld 04/09/2022 87  70 - 99 mg/dL Final   Glucose reference range applies only to samples taken after fasting for at least 8 hours.   BUN 04/09/2022 19  6 - 20 mg/dL Final   Creatinine, Ser 04/09/2022 0.84  0.61 - 1.24 mg/dL Final   Calcium 60/45/4098 9.2  8.9 - 10.3 mg/dL Final   Total  Protein 04/09/2022 8.2 (H)  6.5 - 8.1 g/dL Final   Albumin 11/91/4782 4.3  3.5 - 5.0 g/dL Final   AST 95/62/1308 84 (H)  15 - 41 U/L Final   ALT 04/09/2022 67 (H)  0 - 44 U/L Final   Alkaline Phosphatase 04/09/2022 42  38 - 126 U/L Final   Total Bilirubin 04/09/2022 1.5 (H)  0.3 - 1.2 mg/dL Final   GFR, Estimated 04/09/2022 >60  >60 mL/min Final   Comment: (NOTE) Calculated using the CKD-EPI Creatinine Equation (2021)    Anion gap 04/09/2022 8  5 - 15 Final   Performed at [facility], [address] [place], [place] [address]   Alcohol, Ethyl (B) 04/09/2022 <10  <10 mg/dL Final   Comment: (NOTE) Lowest detectable limit for serum alcohol is 10 mg/dL.  For medical purposes only. Performed at [facility], [address] [place], [place] [address]    Salicylate Lvl 04/09/2022 <7.0 (L)  7.0 - 30.0 mg/dL Final   Performed at [facility], [address] [place], [place] [address]   Acetaminophen (Tylenol), Serum 04/09/2022 <10 (L)  10 - 30 ug/mL Final   Comment: (NOTE) Therapeutic concentrations vary significantly. A range of 10-30 ug/mL  may be an effective concentration for many patients. However, some  are best treated at concentrations outside of this range. Acetaminophen concentrations >150 ug/mL at 4 hours after ingestion  and >50 ug/mL at 12 hours after ingestion are often associated with  toxic reactions.  Performed at [facility], [address] [place], [place] [address]    WBC 04/09/2022 10.8 (H)  4.0 - 10.5 K/uL Final   RBC 04/09/2022 4.86  4.22 - 5.81 MIL/uL Final   Hemoglobin 04/09/2022 13.8  13.0 - 17.0 g/dL Final   HCT 09/09/2535 39.4  39.0 - 52.0 % Final   MCV 04/09/2022 81.1  80.0 - 100.0 fL Final   MCH 04/09/2022 28.4  26.0 - 34.0 pg Final   MCHC 04/09/2022 35.0  30.0 - 36.0 g/dL Final   RDW 64/40/3474 14.4  11.5 - 15.5 % Final   Platelets 04/09/2022 210  150 - 400 K/uL Final   nRBC 04/09/2022 0.0  0.0 - 0.2 %  Final   Performed at [facility], [address] [place], [place] [address]   Tricyclic, Ur Screen 04/09/2022 NONE DETECTED  NONE DETECTED Final   Amphetamines, Ur Screen 04/09/2022 NONE DETECTED  NONE DETECTED Final  MDMA (Ecstasy)Ur Screen 04/09/2022 NONE DETECTED  NONE DETECTED Final   Cocaine Metabolite,Ur [place] 04/09/2022 POSITIVE (A)  NONE DETECTED Final   Opiate, Ur Screen 04/09/2022 NONE DETECTED  NONE DETECTED Final   Phencyclidine (PCP) Ur S 04/09/2022 NONE DETECTED  NONE DETECTED Final   Cannabinoid 50 Ng, Ur [place] 04/09/2022 POSITIVE (A)  NONE DETECTED Final   Barbiturates, Ur Screen 04/09/2022 NONE DETECTED  NONE DETECTED Final   Benzodiazepine, Ur Scrn 04/09/2022 POSITIVE (A)  NONE DETECTED Final   Methadone Scn, Ur 04/09/2022 NONE DETECTED  NONE DETECTED Final   Comment: (NOTE) Tricyclics + metabolites, urine    Cutoff 1000 ng/mL Amphetamines + metabolites, urine  Cutoff 1000 ng/mL MDMA (Ecstasy), urine              Cutoff 500 ng/mL Cocaine Metabolite, urine          Cutoff 300 ng/mL Opiate + metabolites, urine        Cutoff 300 ng/mL Phencyclidine (PCP), urine         Cutoff 25 ng/mL Cannabinoid, urine                 Cutoff 50 ng/mL Barbiturates + metabolites, urine  Cutoff 200 ng/mL Benzodiazepine, urine              Cutoff 200 ng/mL Methadone, urine                   Cutoff 300 ng/mL  The urine drug screen provides only a preliminary, unconfirmed analytical test result and should not be used for non-medical purposes. Clinical consideration and professional judgment should be applied to any positive drug screen result due to possible interfering substances. A more specific alternate chemical method must be used in order to obtain a confirmed analytical result. Gas chromatography / mass spectrometry (GC/MS) is the preferred confirm                          atory method. Performed at [facility], [address] [place], [place] [address]    SARS  Coronavirus 2 by RT PCR 04/09/2022 NEGATIVE  NEGATIVE Final   Comment: (NOTE) SARS-CoV-2 target nucleic acids are NOT DETECTED.  The SARS-CoV-2 RNA is generally detectable in upper respiratory specimens during the acute phase of infection. The lowest concentration of SARS-CoV-2 viral copies this assay can detect is 138 copies/mL. A negative result does not preclude SARS-Cov-2 infection and should not be used as the sole basis for treatment or other patient management decisions. A negative result may occur with  improper specimen collection/handling, submission of specimen other than nasopharyngeal swab, presence of viral mutation(s) within the areas targeted by this assay, and inadequate number of viral copies(<138 copies/mL). A negative result must be combined with clinical observations, patient history, and epidemiological information. The expected result is Negative.  Fact Sheet for Patients:  BloggerCourse.com  Fact Sheet for Healthcare Providers:  SeriousBroker.it  This test is no                          t yet approved or cleared by the [place] FDA and  has been authorized for detection and/or diagnosis of SARS-CoV-2 by FDA under an Emergency Use Authorization (EUA). This EUA will remain  in effect (meaning this test can be used) for the duration of the COVID-19 declaration under Section 564(b)(1) of the Act, 21 U.S.C.section 360bbb-3(b)(1), unless the authorization is terminated  or revoked sooner.       Influenza A by PCR 04/09/2022 NEGATIVE  NEGATIVE Final   Influenza B by PCR 04/09/2022 NEGATIVE  NEGATIVE Final   Comment: (NOTE) The Xpert Xpress SARS-CoV-2/FLU/RSV plus assay is intended as an aid in the diagnosis of influenza from Nasopharyngeal swab specimens and should not be used as a sole basis for treatment. Nasal washings and aspirates are unacceptable for Xpert Xpress SARS-CoV-2/FLU/RSV testing.  Fact  Sheet for Patients: BloggerCourse.com  Fact Sheet for Healthcare Providers: SeriousBroker.it  This test is not yet approved or cleared by the [place] FDA and has been authorized for detection and/or diagnosis of SARS-CoV-2 by FDA under an Emergency Use Authorization (EUA). This EUA will remain in effect (meaning this test can be used) for the duration of the COVID-19 declaration under Section 564(b)(1) of the Act, 21 U.S.C. section 360bbb-3(b)(1), unless the authorization is terminated or revoked.  Performed at [facility], [address] [place], [place] [address]   Admission on 03/10/2022, Discharged on 03/11/2022  Component Date Value Ref Range Status   SARS Coronavirus 2 by RT PCR 03/10/2022 NEGATIVE  NEGATIVE Final   Comment: (NOTE) SARS-CoV-2 target nucleic acids are NOT DETECTED.  The SARS-CoV-2 RNA is generally detectable in upper respiratory specimens during the acute phase of infection. The lowest concentration of SARS-CoV-2 viral copies this assay can detect is 138 copies/mL. A negative result does not preclude SARS-Cov-2 infection and should not be used as the sole basis for treatment or other patient management decisions. A negative result may occur with  improper specimen collection/handling, submission of specimen other than nasopharyngeal swab, presence of viral mutation(s) within the areas targeted by this assay, and inadequate number of viral copies(<138 copies/mL). A negative result must be combined with clinical observations, patient history, and epidemiological information. The expected result is Negative.  Fact Sheet for Patients:  BloggerCourse.com  Fact Sheet for Healthcare Providers:  SeriousBroker.it  This test is no                          t yet approved or cleared by the [place] FDA and  has been authorized for detection  and/or diagnosis of SARS-CoV-2 by FDA under an Emergency Use Authorization (EUA). This EUA will remain  in effect (meaning this test can be used) for the duration of the COVID-19 declaration under Section 564(b)(1) of the Act, 21 U.S.C.section 360bbb-3(b)(1), unless the authorization is terminated  or revoked sooner.       Influenza A by PCR 03/10/2022 NEGATIVE  NEGATIVE Final   Influenza B by PCR 03/10/2022 NEGATIVE  NEGATIVE Final   Comment: (NOTE) The Xpert Xpress SARS-CoV-2/FLU/RSV plus assay is intended as an aid in the diagnosis of influenza from Nasopharyngeal swab specimens and should not be used as a sole basis for treatment. Nasal washings and aspirates are unacceptable for Xpert Xpress SARS-CoV-2/FLU/RSV testing.  Fact Sheet for Patients: BloggerCourse.com  Fact Sheet for Healthcare Providers: SeriousBroker.it  This test is not yet approved or cleared by the [place] FDA and has been authorized for detection and/or diagnosis of SARS-CoV-2 by FDA under an Emergency Use Authorization (EUA). This EUA will remain in effect (meaning this test can be used) for the duration of the COVID-19 declaration under Section 564(b)(1) of the Act, 21 U.S.C. section 360bbb-3(b)(1), unless the authorization is terminated or revoked.  Performed at [facility], [address] [place], [place] [address]  Color, Urine 03/10/2022 YELLOW  YELLOW Final   APPearance 03/10/2022 CLEAR  CLEAR Final   Specific Gravity, Urine 03/10/2022 1.028  1.005 - 1.030 Final   pH 03/10/2022 5.0  5.0 - 8.0 Final   Glucose, UA 03/10/2022 NEGATIVE  NEGATIVE mg/dL Final   Hgb urine dipstick 03/10/2022 NEGATIVE  NEGATIVE Final   Bilirubin Urine 03/10/2022 NEGATIVE  NEGATIVE Final   Ketones, ur 03/10/2022 NEGATIVE  NEGATIVE mg/dL Final   Protein, ur 16/08/9603 NEGATIVE  NEGATIVE mg/dL Final   Nitrite 54/07/8118 NEGATIVE  NEGATIVE Final    Leukocytes,Ua 03/10/2022 TRACE (A)  NEGATIVE Final   RBC / HPF 03/10/2022 0-5  0 - 5 RBC/hpf Final   WBC, UA 03/10/2022 6-10  0 - 5 WBC/hpf Final   Bacteria, UA 03/10/2022 RARE (A)  NONE SEEN Final   Squamous Epithelial / LPF 03/10/2022 0-5  0 - 5 Final   Mucus 03/10/2022 PRESENT   Final   Performed at [facility], [address] [place], [place] [address]   SARSCOV2ONAVIRUS 2 AG 03/10/2022 NEGATIVE  NEGATIVE Final   Comment: (NOTE) SARS-CoV-2 antigen NOT DETECTED.   Negative results are presumptive.  Negative results do not preclude SARS-CoV-2 infection and should not be used as the sole basis for treatment or other patient management decisions, including infection  control decisions, particularly in the presence of clinical signs and  symptoms consistent with COVID-19, or in those who have been in contact with the virus.  Negative results must be combined with clinical observations, patient history, and epidemiological information. The expected result is Negative.  Fact Sheet for Patients: https://www.jennings-kim.com/  Fact Sheet for Healthcare Providers: https://alexander-rogers.biz/  This test is not yet approved or cleared by the [place] FDA and  has been authorized for detection and/or diagnosis of SARS-CoV-2 by FDA under an Emergency Use Authorization (EUA).  This EUA will remain in effect (meaning this test can be used) for the duration of  the COV                          ID-19 declaration under Section 564(b)(1) of the Act, 21 U.S.C. section 360bbb-3(b)(1), unless the authorization is terminated or revoked sooner.     POC Amphetamine UR 03/10/2022 None Detected  NONE DETECTED (Cut Off Level 1000 ng/mL) Final   POC Secobarbital (BAR) 03/10/2022 None Detected  NONE DETECTED (Cut Off Level 300 ng/mL) Final   POC Buprenorphine (BUP) 03/10/2022 None Detected  NONE DETECTED (Cut Off Level 10 ng/mL) Final   POC Oxazepam (BZO) 03/10/2022  None Detected  NONE DETECTED (Cut Off Level 300 ng/mL) Final   POC Cocaine UR 03/10/2022 Positive (A)  NONE DETECTED (Cut Off Level 300 ng/mL) Final   POC Methamphetamine UR 03/10/2022 None Detected  NONE DETECTED (Cut Off Level 1000 ng/mL) Final   POC Morphine 03/10/2022 None Detected  NONE DETECTED (Cut Off Level 300 ng/mL) Final   POC Oxycodone UR 03/10/2022 None Detected  NONE DETECTED (Cut Off Level 100 ng/mL) Final   POC Methadone UR 03/10/2022 None Detected  NONE DETECTED (Cut Off Level 300 ng/mL) Final   POC Marijuana UR 03/10/2022 Positive (A)  NONE DETECTED (Cut Off Level 50 ng/mL) Final  Admission on 03/10/2022, Discharged on 03/10/2022  Component Date Value Ref Range Status   Sodium 03/10/2022 138  135 - 145 mmol/L Final   Potassium 03/10/2022 3.4 (L)  3.5 - 5.1 mmol/L Final   Chloride 03/10/2022 106  98 -  111 mmol/L Final   CO2 03/10/2022 26  22 - 32 mmol/L Final   Glucose, Bld 03/10/2022 68 (L)  70 - 99 mg/dL Final   Glucose reference range applies only to samples taken after fasting for at least 8 hours.   BUN 03/10/2022 9  6 - 20 mg/dL Final   Creatinine, Ser 03/10/2022 0.74  0.61 - 1.24 mg/dL Final   Calcium 91/47/8295 9.1  8.9 - 10.3 mg/dL Final   Total Protein 62/13/0865 7.4  6.5 - 8.1 g/dL Final   Albumin 78/46/9629 4.1  3.5 - 5.0 g/dL Final   AST 52/84/1324 40  15 - 41 U/L Final   ALT 03/10/2022 37  0 - 44 U/L Final   Alkaline Phosphatase 03/10/2022 36 (L)  38 - 126 U/L Final   Total Bilirubin 03/10/2022 0.9  0.3 - 1.2 mg/dL Final   GFR, Estimated 03/10/2022 >60  >60 mL/min Final   Comment: (NOTE) Calculated using the CKD-EPI Creatinine Equation (2021)    Anion gap 03/10/2022 6  5 - 15 Final   Performed at [facility], [address] [place], [place] [address]   Alcohol, Ethyl (B) 03/10/2022 <10  <10 mg/dL Final   Comment: (NOTE) Lowest detectable limit for serum alcohol is 10 mg/dL.  For medical purposes only. Performed at [facility], [address], [place], [place] [address]    Salicylate Lvl 03/10/2022 <7.0 (L)  7.0 - 30.0 mg/dL Final   Performed at [facility], [address], [place], [place] [address]   Acetaminophen (Tylenol), Serum 03/10/2022 <10 (L)  10 - 30 ug/mL Final   Comment: (NOTE) Therapeutic concentrations vary significantly. A range of 10-30 ug/mL  may be an effective concentration for many patients. However, some  are best treated at concentrations outside of this range. Acetaminophen concentrations >150 ug/mL at 4 hours after ingestion  and >50 ug/mL at 12 hours after ingestion are often associated with  toxic reactions.  Performed at [facility], [address], [place], [place] [address]    WBC 03/10/2022 9.5  4.0 - 10.5 K/uL Final   RBC 03/10/2022 5.02  4.22 - 5.81 MIL/uL Final   Hemoglobin 03/10/2022 15.0  13.0 - 17.0 g/dL Final   HCT 95/63/8756 41.0  39.0 - 52.0 % Final   MCV 03/10/2022 81.7  80.0 - 100.0 fL Final   MCH 03/10/2022 29.9  26.0 - 34.0 pg Final   MCHC 03/10/2022 36.6 (H)  30.0 - 36.0 g/dL Final   RDW 43/32/9518 14.6  11.5 - 15.5 % Final   Platelets 03/10/2022 227  150 - 400 K/uL Final   nRBC 03/10/2022 0.0  0.0 - 0.2 % Final   Performed at [facility], [address], [place], [place] [address]   Opiates 03/10/2022 NONE DETECTED  NONE DETECTED Final   Cocaine 03/10/2022 POSITIVE (A)  NONE DETECTED Final   Benzodiazepines 03/10/2022 NONE DETECTED  NONE DETECTED Final   Amphetamines 03/10/2022 NONE DETECTED  NONE DETECTED Final   Tetrahydrocannabinol 03/10/2022 POSITIVE (A)  NONE DETECTED Final   Barbiturates 03/10/2022 NONE DETECTED  NONE DETECTED Final   Comment: (NOTE) DRUG SCREEN FOR MEDICAL PURPOSES ONLY.  IF CONFIRMATION IS NEEDED FOR ANY PURPOSE, NOTIFY LAB WITHIN 5 DAYS.  LOWEST DETECTABLE LIMITS FOR URINE DRUG SCREEN Drug Class                     Cutoff (ng/mL) Amphetamine and metabolites  1000 Barbiturate and metabolites    200 Benzodiazepine                  200 Tricyclics and metabolites     300 Opiates and metabolites        300 Cocaine and metabolites        300 THC                            50 Performed at [facility], [address], [place], [place] [address]   Admission on 02/20/2022, Discharged on 02/20/2022  Component Date Value Ref Range Status   Sodium 02/20/2022 137  135 - 145 mmol/L Final   Potassium 02/20/2022 3.5  3.5 - 5.1 mmol/L Final   Chloride 02/20/2022 106  98 - 111 mmol/L Final   CO2 02/20/2022 23  22 - 32 mmol/L Final   Glucose, Bld 02/20/2022 134 (H)  70 - 99 mg/dL Final   Glucose reference range applies only to samples taken after fasting for at least 8 hours.   BUN 02/20/2022 14  6 - 20 mg/dL Final   Creatinine, Ser 02/20/2022 0.88  0.61 - 1.24 mg/dL Final   Calcium 60/45/4098 9.4  8.9 - 10.3 mg/dL Final   Total Protein 11/91/4782 7.9  6.5 - 8.1 g/dL Final   Albumin 95/62/1308 3.9  3.5 - 5.0 g/dL Final   AST 65/78/4696 41  15 - 41 U/L Final   ALT 02/20/2022 42  0 - 44 U/L Final   Alkaline Phosphatase 02/20/2022 37 (L)  38 - 126 U/L Final   Total Bilirubin 02/20/2022 0.9  0.3 - 1.2 mg/dL Final   GFR, Estimated 02/20/2022 >60  >60 mL/min Final   Comment: (NOTE) Calculated using the CKD-EPI Creatinine Equation (2021)    Anion gap 02/20/2022 8  5 - 15 Final   Performed at [facility], [address] [place], [place] [address]   Alcohol, Ethyl (B) 02/20/2022 <10  <10 mg/dL Final   Comment: (NOTE) Lowest detectable limit for serum alcohol is 10 mg/dL.  For medical purposes only. Performed at [facility], [address] [place], [place] [address]    Salicylate Lvl 02/20/2022 <7.0 (L)  7.0 - 30.0 mg/dL Final   Performed at [facility], [address], [place], [place] [address]   Acetaminophen (Tylenol), Serum 02/20/2022 <10 (L)  10 - 30 ug/mL Final   Comment: (NOTE) Therapeutic concentrations vary significantly. A range of 10-30 ug/mL  may be an effective  concentration for many patients. However, some  are best treated at concentrations outside of this range. Acetaminophen concentrations >150 ug/mL at 4 hours after ingestion  and >50 ug/mL at 12 hours after ingestion are often associated with  toxic reactions.  Performed at [facility], [address], [place], [place] [address]    WBC 02/20/2022 11.3 (H)  4.0 - 10.5 K/uL Final   RBC 02/20/2022 5.24  4.22 - 5.81 MIL/uL Final   Hemoglobin 02/20/2022 15.6  13.0 - 17.0 g/dL Final   HCT 64/40/3474 42.4  39.0 - 52.0 % Final   MCV 02/20/2022 80.9  80.0 - 100.0 fL Final   MCH 02/20/2022 29.8  26.0 - 34.0 pg Final   MCHC 02/20/2022 36.8 (H)  30.0 - 36.0 g/dL Final   RDW 25/95/6387 14.6  11.5 - 15.5 % Final   Platelets 02/20/2022 226  150 - 400 K/uL  Final   nRBC 02/20/2022 0.0  0.0 - 0.2 % Final   Performed at [facility], [address], [place], [place] [address]   SARS Coronavirus 2 by RT PCR 02/20/2022 NEGATIVE  NEGATIVE Final   Comment: (NOTE) SARS-CoV-2 target nucleic acids are NOT DETECTED.  The SARS-CoV-2 RNA is generally detectable in upper respiratory specimens during the acute phase of infection. The lowest concentration of SARS-CoV-2 viral copies this assay can detect is 138 copies/mL. A negative result does not preclude SARS-Cov-2 infection and should not be used as the sole basis for treatment or other patient management decisions. A negative result may occur with  improper specimen collection/handling, submission of specimen other than nasopharyngeal swab, presence of viral mutation(s) within the areas targeted by this assay, and inadequate number of viral copies(<138 copies/mL). A negative result must be combined with clinical observations, patient history, and epidemiological information. The expected result is Negative.  Fact Sheet for Patients:  BloggerCourse.com  Fact Sheet for Healthcare Providers:   SeriousBroker.it  This test is no                          t yet approved or cleared by the [place] FDA and  has been authorized for detection and/or diagnosis of SARS-CoV-2 by FDA under an Emergency Use Authorization (EUA). This EUA will remain  in effect (meaning this test can be used) for the duration of the COVID-19 declaration under Section 564(b)(1) of the Act, 21 U.S.C.section 360bbb-3(b)(1), unless the authorization is terminated  or revoked sooner.       Influenza A by PCR 02/20/2022 NEGATIVE  NEGATIVE Final   Influenza B by PCR 02/20/2022 NEGATIVE  NEGATIVE Final   Comment: (NOTE) The Xpert Xpress SARS-CoV-2/FLU/RSV plus assay is intended as an aid in the diagnosis of influenza from Nasopharyngeal swab specimens and should not be used as a sole basis for treatment. Nasal washings and aspirates are unacceptable for Xpert Xpress SARS-CoV-2/FLU/RSV testing.  Fact Sheet for Patients: BloggerCourse.com  Fact Sheet for Healthcare Providers: SeriousBroker.it  This test is not yet approved or cleared by the [place] FDA and has been authorized for detection and/or diagnosis of SARS-CoV-2 by FDA under an Emergency Use Authorization (EUA). This EUA will remain in effect (meaning this test can be used) for the duration of the COVID-19 declaration under Section 564(b)(1) of the Act, 21 U.S.C. section 360bbb-3(b)(1), unless the authorization is terminated or revoked.  Performed at [facility], [address] [place], [place] [address]   There may be more visits with results that are not included.    Blood Alcohol level:  Lab Results  Component Value Date   ETH <10 04/16/2022   ETH <10 04/15/2022    Metabolic Disorder Labs: Lab Results  Component Value Date   HGBA1C 5.0 02/16/2022   MPG 96.8 02/16/2022   MPG 96.8 12/24/2021   No results found for: PROLACTIN Lab Results   Component Value Date   CHOL 167 02/16/2022   TRIG 40 02/16/2022   HDL 60 02/16/2022   CHOLHDL 2.8 02/16/2022   VLDL 8 02/16/2022   LDLCALC 99 02/16/2022   LDLCALC 120 (H) 12/24/2021    Therapeutic Lab Levels: Lab Results  Component Value Date   LITHIUM <0.06 (L) 12/22/2021   No results found for: VALPROATE No components found for:  CBMZ  Physical Findings   AIMS    Flowsheet Row Admission (Discharged) from 07/15/2019 in  [facility] INPATIENT BEHAVIORAL MEDICINE Admission (Discharged) from 01/14/2019 in [facility] INPATIENT BEHAV[facility]AL MEDICINE  AIMS Total Score 0 0      AUDIT    Flowsheet Row Admission (Discharged) from 12/21/2021 in [facility] INPATIENT BEHAVIORAL MEDICINE Admission (Discharged) from 07/15/2021 in [facility] INPATIENT BEHAVIORAL MEDICINE Admission (Discharged) from 07/15/2019 in [facility] INPATIENT BEHAVIORAL MEDICINE Admission (Discharged) from 05/22/2019 in [facility] INPATIENT BEHAVIORAL MEDICINE Admission (Discharged) from 01/14/2019 in [facility] INPATIENT BEHAVIORAL MEDICINE  Alcohol Use Disorder Identification Test Final Score (AUDIT) GAD-7    Flowsheet Row Clinical Support from 07/27/2021 in [facility] Office Visit from 04/29/2021 in [facility]  Total GAD-7 Score 2 2      PHQ2-9    Flowsheet Row ED from 04/19/2022 in [facility] ED from 02/16/2022 in [facility] Office Visit from 08/03/2021 in [facility] Clinical Support from 07/27/2021 in [facility] Office Visit from 04/29/2021 in [facility]  PHQ-2 Total Score 5 5 0 0 0  PHQ-9 Total Score 19 24 -- -- --      Flowsheet Row ED from 04/19/2022 in [facility] Most recent reading at 04/19/2022  6:55 AM ED from 04/19/2022 in [facility] EMERGENCY DEPARTMENT Most recent reading at 04/19/2022 12:56 AM ED from  04/16/2022 in [facility] Most recent reading at 04/16/2022  3:58 PM  C-SSRS RISK CATEGORY High Risk No Risk High Risk        Musculoskeletal  Strength & Muscle Tone: within normal limits Gait & Station: normal Patient leans: N/A  Psychiatric Specialty Exam  Presentation  General Appearance: Disheveled  Eye Contact:Fair  Speech:Clear and Coherent; Normal Rate  Speech Volume:Normal  Handedness:Right   Mood and Affect  Mood:Dysphoric  Affect:Congruent   Thought Process  Thought Processes:Coherent  Descriptions of Associations:Intact  Orientation:Partial  Thought Content:-- (AVH, SI)  Diagnosis of Schizophrenia or Schizoaffective disorder in past: Yes  Duration of Psychotic Symptoms: Greater than six months   Hallucinations:Hallucinations: Auditory Description of Auditory Hallucinations: Hearing voices to hurt himself Description of Visual Hallucinations: Denies VH today but usually sees Flying spirits  Ideas of Reference:Paranoia  Suicidal Thoughts:Suicidal Thoughts: Yes, Active SI Active Intent and/or Plan: With Intent; Without Plan  Homicidal Thoughts:Homicidal Thoughts: No   Sensorium  Memory:Recent Poor; Remote Poor; Immediate Poor  Judgment:Poor  Insight:Fair   Executive Functions  Concentration:Fair  Attention Span:Fair  Recall:Fair  Fund of Knowledge:Fair  Language:Good   Psychomotor Activity  Psychomotor Activity:Psychomotor Activity: Normal   Assets  Assets:Communication Skills; Desire for Improvement; Physical Health   Sleep  Sleep:Sleep: Poor   Nutritional Assessment (For OBS and FBC admissions only) Has the patient had a weight loss or gain of 10 pounds or more in the last 3 months?: No Has the patient had a decrease in food intake/or appetite?: No Does the patient have dental problems?: No Does the patient have eating habits or behaviors that may be indicators of an eating disorder  including binging or inducing vomiting?: No Has the patient recently lost weight without trying?: 0 Has the patient been eating poorly because of a decreased appetite?: 0 Malnutrition Screening Tool Score: 0    Physical Exam  Physical Exam Vitals and nursing note reviewed.  Constitutional:      General: He is not in acute distress.    Appearance: He  is not ill-appearing, toxic-appearing or diaphoretic.  HENT:     Head: Normocephalic.  Pulmonary:     Effort: Pulmonary effort is normal.  Neurological:     Mental Status: He is alert and oriented to person, place, and time.   Review of Systems  Constitutional:  Negative for chills and fever.  Respiratory:  Negative for cough and shortness of breath.   Cardiovascular:  Negative for chest pain.  Gastrointestinal:  Negative for abdominal pain, diarrhea and vomiting.  Neurological:  Negative for dizziness and headaches.  Psychiatric/Behavioral:  Positive for depression, hallucinations, substance abuse and suicidal ideas. The patient is nervous/anxious and has insomnia.   Blood pressure 116/67, pulse 64, temperature 98.4 F (36.9 C), temperature source Oral, resp. rate 19, SpO2 99 %. There is no height or weight on file to calculate BMI.  Treatment Plan Summary: Daily contact with patient to assess and evaluate symptoms and progress in treatment  Labs on 6/7- UDS positive for amphetamine, oxazepam, cocaine, methamphetamine, THC Covid and Influenza A and B- Negative.  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 - Start multivitamin and thiamine. - Pt accepted at [facility] in [place] for Thursday at 9am. Pt to remain on observation unit for safety  monitoring due to active suicidal ideations  -Based on my evaluation the patient does not appear to have an emergency medical condition.   Karsten Ro, MD PGy2 04/19/2022 9:50 AM

## 2022-04-19 NOTE — ED Triage Notes (Signed)
Pt presents to [facility] voluntarily, accompanied by [employer] with complaints of suicidal ideation with a plan to walk into traffic. Pt reports being picked up by [employer] at a [facility] gas station and felt that he would not survive in the streets if he did not come to [facility]. Pt states " I cant make it out here without using". Pt originally denied using any alcohol or drugs, but later stated that he had used crack cocaine (unsure of how much). Pt reports having hx of paranoid schizophrenia and takes prescribed medications Abilify and Trazadone. Pt also reports hearing voices that are commanding and telling him to lay in traffic and that he will not make it to [facility]. Pt denies HI.

## 2022-04-19 NOTE — ED Provider Notes (Signed)
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 However, some  are best treated at concentrations outside of this range. Acetaminophen concentrations >150 ug/mL at 4  hours after ingestion  and >50 ug/mL at 12 hours after ingestion are often associated with  toxic reactions.  Performed at [facility], [address] [name], [place] [address]    Total Protein 04/14/2022 8.8 (H)  6.5 - 8.1 g/dL Final   Albumin 13/06/6577 4.4  3.5 - 5.0 g/dL Final   AST 46/96/2952 36  15 - 41 U/L Final   ALT 04/14/2022 40  0 - 44  U/L Final   Alkaline Phosphatase 04/14/2022 39  38 - 126 U/L Final   Total Bilirubin 04/14/2022 1.7 (H)  0.3 - 1.2 mg/dL Final   Bilirubin, Direct 04/14/2022 0.3 (H)  0.0 - 0.2 mg/dL Final   Indirect Bilirubin 04/14/2022 1.4 (H)  0.3 - 0.9 mg/dL Final   Performed at [facility], [address] [place], [place] [address]  Admission on 04/11/2022, Discharged on 04/12/2022  Component Date Value Ref Range Status   Sodium 04/11/2022 137  135 - 145 mmol/L Final   Potassium 04/11/2022 4.0  3.5 - 5.1 mmol/L Final   Chloride 04/11/2022 105  98 - 111 mmol/L Final   CO2 04/11/2022 25  22 - 32 mmol/L Final   Glucose, Bld 04/11/2022 83  70 - 99 mg/dL Final   Glucose reference range applies only to samples taken after fasting for at least 8 hours.   BUN 04/11/2022 7  6 - 20 mg/dL Final   Creatinine, Ser 04/11/2022 0.76  0.61 - 1.24 mg/dL Final   Calcium 60/45/4098 8.8 (L)  8.9 - 10.3 mg/dL Final   Total Protein 11/91/4782 7.2  6.5 - 8.1 g/dL Final   Albumin 95/62/1308 3.8  3.5 - 5.0 g/dL Final   AST 65/78/4696 41  15 - 41 U/L Final   ALT 04/11/2022 48 (H)  0 - 44 U/L Final   Alkaline Phosphatase 04/11/2022 34 (L)  38 - 126 U/L Final   Total Bilirubin 04/11/2022 0.7  0.3 - 1.2 mg/dL Final   GFR, Estimated 04/11/2022 >60  >60 mL/min Final   Comment: (NOTE) Calculated using the CKD-EPI Creatinine Equation (2021)    Anion gap 04/11/2022 7  5 - 15 Final   Performed at [facility], [address] [place], [place] [address]   Alcohol, Ethyl (B) 04/11/2022 <10  <10 mg/dL Final   Comment: (NOTE) Lowest  detectable limit for serum alcohol is 10 mg/dL.  For medical purposes only. Performed at [facility], [address], [place], [place] [address]    Salicylate Lvl 04/11/2022 <7.0 (L)  7.0 - 30.0 mg/dL Final   Performed at [facility], [address], [place], [place] [address]   Acetaminophen (Tylenol), Serum 04/11/2022 <10 (L)  10 - 30 ug/mL Final   Comment: (NOTE) Therapeutic concentrations vary significantly. A range of 10-30 ug/mL  may be an effective concentration for many patients. However, some  are best treated at concentrations outside of this range. Acetaminophen concentrations >150 ug/mL at 4 hours after ingestion  and >50 ug/mL at 12 hours after ingestion are often associated with  toxic reactions.  Performed at [facility], [address] [place], [place] [address]    WBC 04/11/2022 9.1  4.0 - 10.5 K/uL Final   RBC 04/11/2022 4.63  4.22 - 5.81 MIL/uL Final   Hemoglobin 04/11/2022 13.8  13.0 - 17.0 g/dL Final   HCT 64/40/3474 37.7 (L)  39.0 - 52.0 % Final   MCV 04/11/2022 81.4  80.0 - 100.0 fL Final   MCH 04/11/2022 29.8  26.0 - 34.0 pg Final   MCHC 04/11/2022 36.6 (H)  30.0 - 36.0 g/dL Final   RDW 25/95/6387 14.6  11.5 - 15.5 % Final   Platelets 04/11/2022 213  150 - 400 K/uL Final   nRBC 04/11/2022 0.0  0.0 - 0.2 % Final   Performed at [facility], [address] [place], [place] [address]   Opiates 04/11/2022  NONE DETECTED  NONE DETECTED Final   Cocaine 04/11/2022 POSITIVE (A)  NONE DETECTED Final   Benzodiazepines 04/11/2022 POSITIVE (A)  NONE DETECTED Final   Amphetamines 04/11/2022 NONE DETECTED  NONE DETECTED Final   Tetrahydrocannabinol 04/11/2022 POSITIVE (A)  NONE DETECTED Final   Barbiturates 04/11/2022 NONE DETECTED  NONE DETECTED Final   Comment: (NOTE) DRUG SCREEN FOR MEDICAL PURPOSES ONLY.  IF CONFIRMATION IS NEEDED FOR ANY PURPOSE, NOTIFY LAB WITHIN 5 DAYS.  LOWEST DETECTABLE LIMITS FOR URINE DRUG SCREEN Drug Class                      Cutoff (ng/mL) Amphetamine and metabolites    1000 Barbiturate and metabolites    200 Benzodiazepine                 200 Tricyclics and metabolites     300 Opiates and metabolites        300 Cocaine and metabolites        300 THC                            50 Performed at [facility], [address], [place], [place] [address]   Admission on 04/10/2022, Discharged on 04/10/2022  Component Date Value Ref Range Status   Sodium 04/10/2022 137  135 - 145 mmol/L Final   Potassium 04/10/2022 3.5  3.5 - 5.1 mmol/L Final   Chloride 04/10/2022 106  98 - 111 mmol/L Final   CO2 04/10/2022 23  22 - 32 mmol/L Final   Glucose, Bld 04/10/2022 98  70 - 99 mg/dL Final   Glucose reference range applies only to samples taken after fasting for at least 8 hours.   BUN 04/10/2022 14  6 - 20 mg/dL Final   Creatinine, Ser 04/10/2022 0.72  0.61 - 1.24 mg/dL Final   Calcium 60/45/4098 8.8 (L)  8.9 - 10.3 mg/dL Final   Total Protein 11/91/4782 8.2 (H)  6.5 - 8.1 g/dL Final   Albumin 95/62/1308 4.2  3.5 - 5.0 g/dL Final   AST 65/78/4696 78 (H)  15 - 41 U/L Final   ALT 04/10/2022 66 (H)  0 - 44 U/L Final   Alkaline Phosphatase 04/10/2022 42  38 - 126 U/L Final   Total Bilirubin 04/10/2022 1.6 (H)  0.3 - 1.2 mg/dL Final   GFR, Estimated 04/10/2022 >60  >60 mL/min Final   Comment: (NOTE) Calculated using the CKD-EPI Creatinine Equation (2021)    Anion gap 04/10/2022 8  5 - 15 Final   Performed at [facility], [address] [place], [place] [address]   Alcohol, Ethyl (B) 04/10/2022 <10  <10 mg/dL Final   Comment: (NOTE) Lowest detectable limit for serum alcohol is 10 mg/dL.  For medical purposes only. Performed at [facility], [address] [place], [place] [address]    Salicylate Lvl 04/10/2022 <7.0 (L)  7.0 - 30.0 mg/dL Final   Performed at [facility], [address] [place], [place] [address]   Acetaminophen (Tylenol), Serum 04/10/2022  <10 (L)  10 - 30 ug/mL Final   Comment: (NOTE) Therapeutic concentrations vary significantly. A range of 10-30 ug/mL  may be an effective concentration for many patients. However, some  are best treated at concentrations outside of this range. Acetaminophen concentrations >150 ug/mL at 4 hours after ingestion  and >50 ug/mL at 12 hours after ingestion are often associated with  toxic reactions.  Performed at [facility], [address] [place], [place] [address]    WBC 04/10/2022 9.9  4.0 - 10.5 K/uL Final   RBC 04/10/2022 4.91  4.22 - 5.81 MIL/uL Final   Hemoglobin 04/10/2022 14.2  13.0 - 17.0 g/dL Final   HCT 60/45/4098 39.6  39.0 - 52.0 % Final   MCV 04/10/2022 80.7  80.0 - 100.0 fL Final   MCH 04/10/2022 28.9  26.0 - 34.0 pg Final   MCHC 04/10/2022 35.9  30.0 - 36.0 g/dL Final   RDW 11/91/4782 14.2  11.5 - 15.5 % Final   Platelets 04/10/2022 215  150 - 400 K/uL Final   nRBC 04/10/2022 0.0  0.0 - 0.2 % Final   Performed at [facility], [address] [place], [place] [address]   Tricyclic, Ur Screen 04/10/2022 NONE DETECTED  NONE DETECTED Final   Amphetamines, Ur Screen 04/10/2022 NONE DETECTED  NONE DETECTED Final   MDMA (Ecstasy)Ur Screen 04/10/2022 NONE DETECTED  NONE DETECTED Final   Cocaine Metabolite,Ur [place] 04/10/2022 POSITIVE (A)  NONE DETECTED Final   Opiate, Ur Screen 04/10/2022 NONE DETECTED  NONE DETECTED Final   Phencyclidine (PCP) Ur S 04/10/2022 NONE DETECTED  NONE DETECTED Final   Cannabinoid 50 Ng, Ur [place] 04/10/2022 POSITIVE (A)  NONE DETECTED Final   Barbiturates, Ur Screen 04/10/2022 NONE DETECTED  NONE DETECTED Final   Benzodiazepine, Ur Scrn 04/10/2022 POSITIVE (A)  NONE DETECTED Final   Methadone Scn, Ur 04/10/2022 NONE DETECTED  NONE DETECTED Final   Comment: (NOTE) Tricyclics + metabolites, urine    Cutoff 1000 ng/mL Amphetamines + metabolites, urine  Cutoff 1000 ng/mL MDMA (Ecstasy), urine              Cutoff 500 ng/mL Cocaine  Metabolite, urine          Cutoff 300 ng/mL Opiate + metabolites, urine        Cutoff 300 ng/mL Phencyclidine (PCP), urine         Cutoff 25 ng/mL Cannabinoid, urine                 Cutoff 50 ng/mL Barbiturates + metabolites, urine  Cutoff 200 ng/mL Benzodiazepine, urine              Cutoff 200 ng/mL Methadone, urine                   Cutoff 300 ng/mL  The urine drug screen provides only a preliminary, unconfirmed analytical test result and should not be used for non-medical purposes. Clinical consideration and professional judgment should be applied to any positive drug screen result due to possible interfering substances. A more specific alternate chemical method must be used in order to obtain a confirmed analytical result. Gas chromatography / mass spectrometry (GC/MS) is the preferred confirm                          atory method. Performed at [facility], [address] [place], [place] [address]   Admission on 04/09/2022, Discharged on 04/09/2022  Component Date Value Ref Range Status   Sodium 04/09/2022 140  135 - 145 mmol/L Final   Potassium 04/09/2022 4.2  3.5 - 5.1 mmol/L Final   Chloride 04/09/2022 108  98 - 111 mmol/L Final   CO2 04/09/2022 24  22 - 32 mmol/L Final   Glucose, Bld 04/09/2022 87  70 - 99 mg/dL Final   Glucose reference range  applies only to samples taken after fasting for at least 8 hours.   BUN 04/09/2022 19  6 - 20 mg/dL Final   Creatinine, Ser 04/09/2022 0.84  0.61 - 1.24 mg/dL Final   Calcium 16/08/9603 9.2  8.9 - 10.3 mg/dL Final   Total Protein 54/07/8118 8.2 (H)  6.5 - 8.1 g/dL Final   Albumin 14/78/2956 4.3  3.5 - 5.0 g/dL Final   AST 21/30/8657 84 (H)  15 - 41 U/L Final   ALT 04/09/2022 67 (H)  0 - 44 U/L Final   Alkaline Phosphatase 04/09/2022 42  38 - 126 U/L Final   Total Bilirubin 04/09/2022 1.5 (H)  0.3 - 1.2 mg/dL Final   GFR, Estimated 04/09/2022 >60  >60 mL/min Final   Comment: (NOTE) Calculated using the CKD-EPI  Creatinine Equation (2021)    Anion gap 04/09/2022 8  5 - 15 Final   Performed at [facility], [address] [place], [place] [address]   Alcohol, Ethyl (B) 04/09/2022 <10  <10 mg/dL Final   Comment: (NOTE) Lowest detectable limit for serum alcohol is 10 mg/dL.  For medical purposes only. Performed at [facility], [address] [place], [place] [address]    Salicylate Lvl 04/09/2022 <7.0 (L)  7.0 - 30.0 mg/dL Final   Performed at [facility], [address] [place], [place] [address]   Acetaminophen (Tylenol), Serum 04/09/2022 <10 (L)  10 - 30 ug/mL Final   Comment: (NOTE) Therapeutic concentrations vary significantly. A range of 10-30 ug/mL  may be an effective concentration for many patients. However, some  are best treated at concentrations outside of this range. Acetaminophen concentrations >150 ug/mL at 4 hours after ingestion  and >50 ug/mL at 12 hours after ingestion are often associated with  toxic reactions.  Performed at [facility], [address] [place], [place] [address]    WBC 04/09/2022 10.8 (H)  4.0 - 10.5 K/uL Final   RBC 04/09/2022 4.86  4.22 - 5.81 MIL/uL Final   Hemoglobin 04/09/2022 13.8  13.0 - 17.0 g/dL Final   HCT 72/53/6644 39.4  39.0 - 52.0 % Final   MCV 04/09/2022 81.1  80.0 - 100.0 fL Final   MCH 04/09/2022 28.4  26.0 - 34.0 pg Final   MCHC 04/09/2022 35.0  30.0 - 36.0 g/dL Final   RDW 03/47/4259 14.4  11.5 - 15.5 % Final   Platelets 04/09/2022 210  150 - 400 K/uL Final   nRBC 04/09/2022 0.0  0.0 - 0.2 % Final   Performed at [facility], [address] [place], [place] [address]   Tricyclic, Ur Screen 04/09/2022 NONE DETECTED  NONE DETECTED Final   Amphetamines, Ur Screen 04/09/2022 NONE DETECTED  NONE DETECTED Final   MDMA (Ecstasy)Ur Screen 04/09/2022 NONE DETECTED  NONE DETECTED Final   Cocaine Metabolite,Ur [place] 04/09/2022 POSITIVE (A)  NONE DETECTED Final   Opiate, Ur Screen  04/09/2022 NONE DETECTED  NONE DETECTED Final   Phencyclidine (PCP) Ur S 04/09/2022 NONE DETECTED  NONE DETECTED Final   Cannabinoid 50 Ng, Ur [place] 04/09/2022 POSITIVE (A)  NONE DETECTED Final   Barbiturates, Ur Screen 04/09/2022 NONE DETECTED  NONE DETECTED Final   Benzodiazepine, Ur Scrn 04/09/2022 POSITIVE (A)  NONE DETECTED Final   Methadone Scn, Ur 04/09/2022 NONE DETECTED  NONE DETECTED Final   Comment: (NOTE) Tricyclics + metabolites, urine    Cutoff 1000 ng/mL Amphetamines + metabolites, urine  Cutoff 1000 ng/mL MDMA (Ecstasy), urine  Cutoff 500 ng/mL Cocaine Metabolite, urine          Cutoff 300 ng/mL Opiate + metabolites, urine        Cutoff 300 ng/mL Phencyclidine (PCP), urine         Cutoff 25 ng/mL Cannabinoid, urine                 Cutoff 50 ng/mL Barbiturates + metabolites, urine  Cutoff 200 ng/mL Benzodiazepine, urine              Cutoff 200 ng/mL Methadone, urine                   Cutoff 300 ng/mL  The urine drug screen provides only a preliminary, unconfirmed analytical test result and should not be used for non-medical purposes. Clinical consideration and professional judgment should be applied to any positive drug screen result due to possible interfering substances. A more specific alternate chemical method must be used in order to obtain a confirmed analytical result. Gas chromatography / mass spectrometry (GC/MS) is the preferred confirm                          atory method. Performed at [facility], [address] [place], [place] [address]    SARS Coronavirus 2 by RT PCR 04/09/2022 NEGATIVE  NEGATIVE Final   Comment: (NOTE) SARS-CoV-2 target nucleic acids are NOT DETECTED.  The SARS-CoV-2 RNA is generally detectable in upper respiratory specimens during the acute phase of infection. The lowest concentration of SARS-CoV-2 viral copies this assay can detect is 138 copies/mL. A negative result does not preclude SARS-Cov-2 infection  and should not be used as the sole basis for treatment or other patient management decisions. A negative result may occur with  improper specimen collection/handling, submission of specimen other than nasopharyngeal swab, presence of viral mutation(s) within the areas targeted by this assay, and inadequate number of viral copies(<138 copies/mL). A negative result must be combined with clinical observations, patient history, and epidemiological information. The expected result is Negative.  Fact Sheet for Patients:  BloggerCourse.com  Fact Sheet for Healthcare Providers:  SeriousBroker.it  This test is no                          t yet approved or cleared by the [place] FDA and  has been authorized for detection and/or diagnosis of SARS-CoV-2 by FDA under an Emergency Use Authorization (EUA). This EUA will remain  in effect (meaning this test can be used) for the duration of the COVID-19 declaration under Section 564(b)(1) of the Act, 21 U.S.C.section 360bbb-3(b)(1), unless the authorization is terminated  or revoked sooner.       Influenza A by PCR 04/09/2022 NEGATIVE  NEGATIVE Final   Influenza B by PCR 04/09/2022 NEGATIVE  NEGATIVE Final   Comment: (NOTE) The Xpert Xpress SARS-CoV-2/FLU/RSV plus assay is intended as an aid in the diagnosis of influenza from Nasopharyngeal swab specimens and should not be used as a sole basis for treatment. Nasal washings and aspirates are unacceptable for Xpert Xpress SARS-CoV-2/FLU/RSV testing.  Fact Sheet for Patients: BloggerCourse.com  Fact Sheet for Healthcare Providers: SeriousBroker.it  This test is not yet approved or cleared by the [place] FDA and has been authorized for detection and/or diagnosis of SARS-CoV-2 by FDA under an Emergency Use Authorization (EUA). This EUA will remain in effect (meaning this test can be  used) for  the duration of the COVID-19 declaration under Section 564(b)(1) of the Act, 21 U.S.C. section 360bbb-3(b)(1), unless the authorization is terminated or revoked.  Performed at [facility], [address] [place], [place] [address]   Admission on 03/10/2022, Discharged on 03/11/2022  Component Date Value Ref Range Status   SARS Coronavirus 2 by RT PCR 03/10/2022 NEGATIVE  NEGATIVE Final   Comment: (NOTE) SARS-CoV-2 target nucleic acids are NOT DETECTED.  The SARS-CoV-2 RNA is generally detectable in upper respiratory specimens during the acute phase of infection. The lowest concentration of SARS-CoV-2 viral copies this assay can detect is 138 copies/mL. A negative result does not preclude SARS-Cov-2 infection and should not be used as the sole basis for treatment or other patient management decisions. A negative result may occur with  improper specimen collection/handling, submission of specimen other than nasopharyngeal swab, presence of viral mutation(s) within the areas targeted by this assay, and inadequate number of viral copies(<138 copies/mL). A negative result must be combined with clinical observations, patient history, and epidemiological information. The expected result is Negative.  Fact Sheet for Patients:  BloggerCourse.comhttps://www.fda.gov/media/152166/download  Fact Sheet for Healthcare Providers:  SeriousBroker.ithttps://www.fda.gov/media/152162/download  This test is no                          t yet approved or cleared by the [place] FDA and  has been authorized for detection and/or diagnosis of SARS-CoV-2 by FDA under an Emergency Use Authorization (EUA). This EUA will remain  in effect (meaning this test can be used) for the duration of the COVID-19 declaration under Section 564(b)(1) of the Act, 21 U.S.C.section 360bbb-3(b)(1), unless the authorization is terminated  or revoked sooner.       Influenza A by PCR 03/10/2022 NEGATIVE  NEGATIVE Final    Influenza B by PCR 03/10/2022 NEGATIVE  NEGATIVE Final   Comment: (NOTE) The Xpert Xpress SARS-CoV-2/FLU/RSV plus assay is intended as an aid in the diagnosis of influenza from Nasopharyngeal swab specimens and should not be used as a sole basis for treatment. Nasal washings and aspirates are unacceptable for Xpert Xpress SARS-CoV-2/FLU/RSV testing.  Fact Sheet for Patients: BloggerCourse.comhttps://www.fda.gov/media/152166/download  Fact Sheet for Healthcare Providers: SeriousBroker.ithttps://www.fda.gov/media/152162/download  This test is not yet approved or cleared by the [place] FDA and has been authorized for detection and/or diagnosis of SARS-CoV-2 by FDA under an Emergency Use Authorization (EUA). This EUA will remain in effect (meaning this test can be used) for the duration of the COVID-19 declaration under Section 564(b)(1) of the Act, 21 U.S.C. section 360bbb-3(b)(1), unless the authorization is terminated or revoked.  Performed at [facility], [address] [place], [place] [address]    Color, Urine 03/10/2022 YELLOW  YELLOW Final   APPearance 03/10/2022 CLEAR  CLEAR Final   Specific Gravity, Urine 03/10/2022 1.028  1.005 - 1.030 Final   pH 03/10/2022 5.0  5.0 - 8.0 Final   Glucose, UA 03/10/2022 NEGATIVE  NEGATIVE mg/dL Final   Hgb urine dipstick 03/10/2022 NEGATIVE  NEGATIVE Final   Bilirubin Urine 03/10/2022 NEGATIVE  NEGATIVE Final   Ketones, ur 03/10/2022 NEGATIVE  NEGATIVE mg/dL Final   Protein, ur 84/13/244004/28/2023 NEGATIVE  NEGATIVE mg/dL Final   Nitrite 10/27/253604/28/2023 NEGATIVE  NEGATIVE Final   Leukocytes,Ua 03/10/2022 TRACE (A)  NEGATIVE Final   RBC / HPF 03/10/2022 0-5  0 - 5 RBC/hpf Final   WBC, UA 03/10/2022 6-10  0 - 5 WBC/hpf Final   Bacteria, UA 03/10/2022  RARE (A)  NONE SEEN Final   Squamous Epithelial / LPF 03/10/2022 0-5  0 - 5 Final   Mucus 03/10/2022 PRESENT   Final   Performed at [facility], [address] [place], [place] [address]   SARSCOV2ONAVIRUS 2 AG  03/10/2022 NEGATIVE  NEGATIVE Final   Comment: (NOTE) SARS-CoV-2 antigen NOT DETECTED.   Negative results are presumptive.  Negative results do not preclude SARS-CoV-2 infection and should not be used as the sole basis for treatment or other patient management decisions, including infection  control decisions, particularly in the presence of clinical signs and  symptoms consistent with COVID-19, or in those who have been in contact with the virus.  Negative results must be combined with clinical observations, patient history, and epidemiological information. The expected result is Negative.  Fact Sheet for Patients: https://www.jennings-kim.com/  Fact Sheet for Healthcare Providers: https://alexander-rogers.biz/  This test is not yet approved or cleared by the [place] FDA and  has been authorized for detection and/or diagnosis of SARS-CoV-2 by FDA under an Emergency Use Authorization (EUA).  This EUA will remain in effect (meaning this test can be used) for the duration of  the COV                          ID-19 declaration under Section 564(b)(1) of the Act, 21 U.S.C. section 360bbb-3(b)(1), unless the authorization is terminated or revoked sooner.     POC Amphetamine UR 03/10/2022 None Detected  NONE DETECTED (Cut Off Level 1000 ng/mL) Final   POC Secobarbital (BAR) 03/10/2022 None Detected  NONE DETECTED (Cut Off Level 300 ng/mL) Final   POC Buprenorphine (BUP) 03/10/2022 None Detected  NONE DETECTED (Cut Off Level 10 ng/mL) Final   POC Oxazepam (BZO) 03/10/2022 None Detected  NONE DETECTED (Cut Off Level 300 ng/mL) Final   POC Cocaine UR 03/10/2022 Positive (A)  NONE DETECTED (Cut Off Level 300 ng/mL) Final   POC Methamphetamine UR 03/10/2022 None Detected  NONE DETECTED (Cut Off Level 1000 ng/mL) Final   POC Morphine 03/10/2022 None Detected  NONE DETECTED (Cut Off Level 300 ng/mL) Final   POC Oxycodone UR 03/10/2022 None Detected  NONE DETECTED  (Cut Off Level 100 ng/mL) Final   POC Methadone UR 03/10/2022 None Detected  NONE DETECTED (Cut Off Level 300 ng/mL) Final   POC Marijuana UR 03/10/2022 Positive (A)  NONE DETECTED (Cut Off Level 50 ng/mL) Final  Admission on 03/10/2022, Discharged on 03/10/2022  Component Date Value Ref Range Status   Sodium 03/10/2022 138  135 - 145 mmol/L Final   Potassium 03/10/2022 3.4 (L)  3.5 - 5.1 mmol/L Final   Chloride 03/10/2022 106  98 - 111 mmol/L Final   CO2 03/10/2022 26  22 - 32 mmol/L Final   Glucose, Bld 03/10/2022 68 (L)  70 - 99 mg/dL Final   Glucose reference range applies only to samples taken after fasting for at least 8 hours.   BUN 03/10/2022 9  6 - 20 mg/dL Final   Creatinine, Ser 03/10/2022 0.74  0.61 - 1.24 mg/dL Final   Calcium 60/45/4098 9.1  8.9 - 10.3 mg/dL Final   Total Protein 11/91/4782 7.4  6.5 - 8.1 g/dL Final   Albumin 95/62/1308 4.1  3.5 - 5.0 g/dL Final   AST 65/78/4696 40  15 - 41 U/L Final   ALT 03/10/2022 37  0 - 44 U/L Final   Alkaline Phosphatase 03/10/2022 36 (L)  38 -  126 U/L Final   Total Bilirubin 03/10/2022 0.9  0.3 - 1.2 mg/dL Final   GFR, Estimated 03/10/2022 >60  >60 mL/min Final   Comment: (NOTE) Calculated using the CKD-EPI Creatinine Equation (2021)    Anion gap 03/10/2022 6  5 - 15 Final   Performed at [facility], [address] [place], [place] [address]   Alcohol, Ethyl (B) 03/10/2022 <10  <10 mg/dL Final   Comment: (NOTE) Lowest detectable limit for serum alcohol is 10 mg/dL.  For medical purposes only. Performed at [facility], [address], [place], [place] [address]    Salicylate Lvl 03/10/2022 <7.0 (L)  7.0 - 30.0 mg/dL Final   Performed at [facility], [address] [place], [place] [address]   Acetaminophen (Tylenol), Serum 03/10/2022 <10 (L)  10 - 30 ug/mL Final   Comment: (NOTE) Therapeutic concentrations vary significantly. A range of 10-30 ug/mL  may be an effective concentration for many patients.  However, some  are best treated at concentrations outside of this range. Acetaminophen concentrations >150 ug/mL at 4 hours after ingestion  and >50 ug/mL at 12 hours after ingestion are often associated with  toxic reactions.  Performed at [facility], [address] [place], [place] [address]    WBC 03/10/2022 9.5  4.0 - 10.5 K/uL Final   RBC 03/10/2022 5.02  4.22 - 5.81 MIL/uL Final   Hemoglobin 03/10/2022 15.0  13.0 - 17.0 g/dL Final   HCT 29/56/2130 41.0  39.0 - 52.0 % Final   MCV 03/10/2022 81.7  80.0 - 100.0 fL Final   MCH 03/10/2022 29.9  26.0 - 34.0 pg Final   MCHC 03/10/2022 36.6 (H)  30.0 - 36.0 g/dL Final   RDW 86/57/8469 14.6  11.5 - 15.5 % Final   Platelets 03/10/2022 227  150 - 400 K/uL Final   nRBC 03/10/2022 0.0  0.0 - 0.2 % Final   Performed at [facility], [address], [place], [place] [address]   Opiates 03/10/2022 NONE DETECTED  NONE DETECTED Final   Cocaine 03/10/2022 POSITIVE (A)  NONE DETECTED Final   Benzodiazepines 03/10/2022 NONE DETECTED  NONE DETECTED Final   Amphetamines 03/10/2022 NONE DETECTED  NONE DETECTED Final   Tetrahydrocannabinol 03/10/2022 POSITIVE (A)  NONE DETECTED Final   Barbiturates 03/10/2022 NONE DETECTED  NONE DETECTED Final   Comment: (NOTE) DRUG SCREEN FOR MEDICAL PURPOSES ONLY.  IF CONFIRMATION IS NEEDED FOR ANY PURPOSE, NOTIFY LAB WITHIN 5 DAYS.  LOWEST DETECTABLE LIMITS FOR URINE DRUG SCREEN Drug Class                     Cutoff (ng/mL) Amphetamine and metabolites    1000 Barbiturate and metabolites    200 Benzodiazepine                 200 Tricyclics and metabolites     300 Opiates and metabolites        300 Cocaine and metabolites        300 THC                            50 Performed at [facility], [address] [place], [place] [address]   Admission on 02/20/2022, Discharged on 02/20/2022  Component Date Value Ref Range Status   Sodium 02/20/2022 137  135 - 145 mmol/L Final   Potassium  02/20/2022 3.5  3.5 - 5.1 mmol/L Final   Chloride 02/20/2022 106  98 - 111 mmol/L Final   CO2 02/20/2022 23  22 - 32 mmol/L Final   Glucose, Bld 02/20/2022 134 (H)  70 - 99 mg/dL Final   Glucose reference range applies only to samples taken after fasting for at least 8 hours.   BUN 02/20/2022 14  6 - 20 mg/dL Final   Creatinine, Ser 02/20/2022 0.88  0.61 - 1.24 mg/dL Final   Calcium 16/08/9603 9.4  8.9 - 10.3 mg/dL Final   Total Protein 54/07/8118 7.9  6.5 - 8.1 g/dL Final   Albumin 14/78/2956 3.9  3.5 - 5.0 g/dL Final   AST 21/30/8657 41  15 - 41 U/L Final   ALT 02/20/2022 42  0 - 44 U/L Final   Alkaline Phosphatase 02/20/2022 37 (L)  38 - 126 U/L Final   Total Bilirubin 02/20/2022 0.9  0.3 - 1.2 mg/dL Final   GFR, Estimated 02/20/2022 >60  >60 mL/min Final   Comment: (NOTE) Calculated using the CKD-EPI Creatinine Equation (2021)    Anion gap 02/20/2022 8  5 - 15 Final   Performed at [facility], [address], [place], [place] [address]   Alcohol, Ethyl (B) 02/20/2022 <10  <10 mg/dL Final   Comment: (NOTE) Lowest detectable limit for serum alcohol is 10 mg/dL.  For medical purposes only. Performed at [facility], [address] [place], [place] [address]    Salicylate Lvl 02/20/2022 <7.0 (L)  7.0 - 30.0 mg/dL Final   Performed at [facility], [address], [place], [place] [address]   Acetaminophen (Tylenol), Serum 02/20/2022 <10 (L)  10 - 30 ug/mL Final   Comment: (NOTE) Therapeutic concentrations vary significantly. A range of 10-30 ug/mL  may be an effective concentration for many patients. However, some  are best treated at concentrations outside of this range. Acetaminophen concentrations >150 ug/mL at 4 hours after ingestion  and >50 ug/mL at 12 hours after ingestion are often associated with  toxic reactions.  Performed at [facility], [address], [place], [place] [address]    WBC 02/20/2022 11.3 (H)  4.0 - 10.5 K/uL Final   RBC  02/20/2022 5.24  4.22 - 5.81 MIL/uL Final   Hemoglobin 02/20/2022 15.6  13.0 - 17.0 g/dL Final   HCT 72/53/6644 42.4  39.0 - 52.0 % Final   MCV 02/20/2022 80.9  80.0 - 100.0 fL Final   MCH 02/20/2022 29.8  26.0 - 34.0 pg Final   MCHC 02/20/2022 36.8 (H)  30.0 - 36.0 g/dL Final   RDW 03/47/4259 14.6  11.5 - 15.5 % Final   Platelets 02/20/2022 226  150 - 400 K/uL Final   nRBC 02/20/2022 0.0  0.0 - 0.2 % Final   Performed at [facility], [address], [place], [place] [address]   SARS Coronavirus 2 by RT PCR 02/20/2022 NEGATIVE  NEGATIVE Final   Comment: (NOTE) SARS-CoV-2 target nucleic acids are NOT DETECTED.  The SARS-CoV-2 RNA is generally detectable in upper respiratory specimens during the acute phase of infection. The lowest concentration of SARS-CoV-2 viral copies this assay can detect is 138 copies/mL. A negative result does not preclude SARS-Cov-2 infection and should not be used as the sole basis for treatment or other patient management decisions. A negative result may occur with  improper specimen collection/handling, submission of specimen other than nasopharyngeal swab, presence of viral mutation(s) within the areas targeted  by this assay, and inadequate number of viral copies(<138 copies/mL). A negative result must be combined with clinical observations, patient history, and epidemiological information. The expected result is Negative.  Fact Sheet for Patients:  BloggerCourse.com  Fact Sheet for Healthcare Providers:  SeriousBroker.it  This test is no                          t yet approved or cleared by the [place] FDA and  has been authorized for detection and/or diagnosis of SARS-CoV-2 by FDA under an Emergency Use Authorization (EUA). This EUA will remain  in effect (meaning this test can be used) for the duration of the COVID-19 declaration under Section 564(b)(1) of the Act, 21 U.S.C.section  360bbb-3(b)(1), unless the authorization is terminated  or revoked sooner.       Influenza A by PCR 02/20/2022 NEGATIVE  NEGATIVE Final   Influenza B by PCR 02/20/2022 NEGATIVE  NEGATIVE Final   Comment: (NOTE) The Xpert Xpress SARS-CoV-2/FLU/RSV plus assay is intended as an aid in the diagnosis of influenza from Nasopharyngeal swab specimens and should not be used as a sole basis for treatment. Nasal washings and aspirates are unacceptable for Xpert Xpress SARS-CoV-2/FLU/RSV testing.  Fact Sheet for Patients: BloggerCourse.com  Fact Sheet for Healthcare Providers: SeriousBroker.it  This test is not yet approved or cleared by the [place] FDA and has been authorized for detection and/or diagnosis of SARS-CoV-2 by FDA under an Emergency Use Authorization (EUA). This EUA will remain in effect (meaning this test can be used) for the duration of the COVID-19 declaration under Section 564(b)(1) of the Act, 21 U.S.C. section 360bbb-3(b)(1), unless the authorization is terminated or revoked.  Performed at [facility], [address] [place], [place] [address]   There may be more visits with results that are not included.    Allergies: Patient has no known allergies.  PTA Medications: (Not in a hospital admission)   Medical Decision Making  Recommend admission to observation unit for safety. Pt has a bed available at [facility] in [place] on Thusday at 9 am.   Lab Orders         Resp Panel by RT-PCR (Flu A&B, Covid) Anterior Nasal Swab         POCT Urine Drug Screen - (I-Screen)         POC SARS Coronavirus 2 Ag      Clinical Course as of 04/19/22 0625  Wed Apr 19, 2022  0620 POCT Urine Drug Screen - (I-Screen)(!) +amphetamines +methamphetamines +cocaine +oxazepam +marijuana [JB]    Clinical Course User Index [JB] Jackelyn Poling, NP   Recommendations  Based on my evaluation the patient does not appear to  have an emergency medical condition. Pt accepted at [facility] in [place] for Thursday at 9am. Pt to remain on observation unit for safety monitoring due to suicidal ideations with a plan.   Mancel Bale, NP 04/19/22  6:17 AM

## 2022-04-19 NOTE — ED Notes (Signed)
C/o heart burn denies pain or SOB skin warm and dry with appropriate color for ethnicity. Medicated per order Maalox 30 ml by mouth.

## 2022-04-20 MED ORDER — TRAZODONE HCL 50 MG PO TABS: 50.0000 mg | ORAL_TABLET | Freq: Every evening | ORAL | 1 refills | Status: DC | PRN

## 2022-04-20 MED ORDER — TRAZODONE HCL 50 MG PO TABS
50.0000 mg | ORAL_TABLET | Freq: Every evening | ORAL | Status: DC | PRN
  Filled 2022-04-20: qty 14
  Filled 2022-04-20: qty 10

## 2022-04-20 MED ORDER — OLANZAPINE 10 MG PO TBDP: 10.0000 mg | ORAL_TABLET | Freq: Every day | ORAL | 1 refills | Status: DC

## 2022-04-20 NOTE — Progress Notes (Incomplete)
Patient reports he has scheduled appointment at [facility] in [place]. Patient denies any SI or HI and reports he is ready to go. Pt denies any AV/ Hallucinations and states '' I got some good rest here and I feel better. '' Patient appears in no acute distress, no signs of acute decompensation. Meal provided. Patient verbalized understanding of discharge plan. All belongings returned and patient escorted from unit to the car to SAFE transport.

## 2022-04-20 NOTE — ED Notes (Signed)
Pt was given a muffin and juice for breakfast.  

## 2022-04-20 NOTE — Discharge Instructions (Signed)
  Discharge recommendations:  Patient is to take medications as prescribed. Please see information for follow-up appointment with psychiatry and therapy. Please follow up with your primary care provider for all medical related needs.   Medications: The parent/guardian is to contact a medical professional and/or outpatient provider to address any new side effects that develop. Parent/guardian should update outpatient providers of any new medications and/or medication changes.   Atypical antipsychotics: If you are prescribed an atypical antipsychotic, it is recommended that your height, weight, BMI, blood pressure, fasting lipid panel, and fasting blood sugar be monitored by your outpatient providers.  Safety:  The patient should abstain from use of illicit substances/drugs and abuse of any medications. If symptoms worsen or do not continue to improve or if the patient becomes actively suicidal or homicidal then it is recommended that the patient return to the closest hospital emergency department, the [facility], or call 911 for further evaluation and treatment. National Suicide Prevention Lifeline 1-800-SUICIDE or 1-800-273-8255.  About 988 988 offers 24/7 access to trained crisis counselors who can help people experiencing mental health-related distress. People can call or text 988 or chat 988lifeline.org for themselves or if they are worried about a loved one who may need crisis support.  

## 2022-04-20 NOTE — ED Provider Notes (Signed)
FBC/OBS ASAP Discharge Summary  Date and Time: 04/20/2022 8:21 AM  Name: Dominic [name]  MRN:  [id number]   Discharge Diagnoses:  Final diagnoses:  Suicidal ideations  Paranoid schizophrenia (HCC)  Polysubstance abuse (HCC)    Subjective: Patient states that he feels great today. He states that he slept up good last night and slept all day yesterday. He states that his appetite is pretty good. He denies auditory and visual hallucinations. He denies paranoid thought content. He denies homicidal ideations. He denies suicidal ideations. He verbally contracts for safety to discharge to [facility] this morning. He denies withdrawal symptoms this morning. Patient verbalizes readiness to discharge this morning to [facility] for substance use treatment. He denies medication side effects at this time.  Objective: Patient was observed overnight without any disruptive, aggressive or psychotic behaviors. Patient does not appear to be in significant opiate, alcohol or benzol withdrawal. V/s T-97.2, HR 57 (baseline), B/P 121/78, O2 100%. UDS pos amphetamine, BZO, cocaine, methamphetamine, and THC. No CIWA or Cows scored.    Stay Summary: Dominic SloughCedric Deon Hoot is a 46 y.o. male who [facility] for SI with a plan to [place] in traffic on the [place]. He was recently discharged from [facility] ED for similar complaints. Patient endorsed SI without a plan. He endorsed AH, voices telling him to hurt himself. He reported using ice (last use yesterday), fentanyl (last used over the weekend), crack cocaine (daily), marijuana (daily), Xanax  (3 times per week with alcohol ), Adderall (daily ) and alcohol  (on weekends ). He last drank half pint of liquor this past weekend.  [facility] has a [facility] Thursday at 9 am for residential treatment.   Patient was started on a CIWA and COWs. Zyprexa 10 mg po QHS was initiated for paranoid schizophrenia disorder.    Total Time spent with  patient: 20 minutes  Past Psychiatric History: Documented history of schizophrenia, bipolar 1, MDD and malingering.  Past Medical History:  Past Medical History:  Diagnosis Date   Anxiety    Asthma    Bipolar 1 disorder (HCC)    Chronic hepatitis C without hepatic coma (HCC) 05/28/2018   Depression    Hepatitis C    Hepatitis C antibody positive in blood 05/28/2018   Malingering 03/30/2022   [facility] states "directly admitted to fabricating SI and CAH to obtain [psychiatric] admission"   Schizophrenia [facility](HCC)    Sleep apnea     Past Surgical History:  Procedure Laterality Date   CYST REMOVAL NECK     neck   Family History:  Family History  Problem Relation Age of Onset   Hypertension Sister    Schizophrenia Sister    Family Psychiatric History: No history reported.  Social History:  Social History   Substance and Sexual Activity  Alcohol Use Yes   Alcohol/week: 3.0 standard drinks of alcohol   Types: 3 Shots of liquor per week     Social History   Substance and Sexual Activity  Drug Use Yes   Types: Cocaine, Marijuana, Methamphetamines    Social History   Socioeconomic History   Marital status: Single    Spouse name: Not on file   Number of children: Not on file   Years of education: Not on file   Highest education level: Not on file  Occupational History   Not on file  Tobacco Use   Smoking status: Some Days    Packs/day:  1.00    Types: Cigarettes   Smokeless tobacco: Never  Vaping Use   Vaping Use: Never used  Substance and Sexual Activity   Alcohol use: Yes    Alcohol/week: 3.0 standard drinks of alcohol    Types: 3 Shots of liquor per week   Drug use: Yes    Types: Cocaine, Marijuana, Methamphetamines   Sexual activity: Not Currently  Other Topics Concern   Not on file  Social History Narrative   Not on file   Social Determinants of Health   Financial Resource Strain: Not on file  Food Insecurity: Not on file   Transportation Needs: Not on file  Physical Activity: Inactive (11/25/2018)   Exercise Vital Sign    Days of Exercise per Week: 0 days    Minutes of Exercise per Session: 0 min  Stress: Not on file  Social Connections: Not on file   SDOH:  SDOH Screenings   Alcohol Screen: Low Risk  (12/21/2021)   Alcohol Screen    Last Alcohol Screening Score (AUDIT): 2  Depression (PHQ2-9): Medium Risk (04/19/2022)   Depression (PHQ2-9)    PHQ-2 Score: 19  Financial Resource Strain: Not on file  Food Insecurity: Not on file  Housing: Not on file  Physical Activity: Inactive (11/25/2018)   Exercise Vital Sign    Days of Exercise per Week: 0 days    Minutes of Exercise per Session: 0 min  Social Connections: Not on file  Stress: Not on file  Tobacco Use: High Risk (04/19/2022)   Patient History    Smoking Tobacco Use: Some Days    Smokeless Tobacco Use: Never    Passive Exposure: Not on file  Transportation Needs: Not on file    Tobacco Cessation:  Prescription not provided because: declined  Current Medications:  Current Facility-Administered Medications  Medication Dose Route Frequency Provider Last Rate Last Admin   acetaminophen (TYLENOL) tablet 650 mg  650 mg Oral Q6H PRN Onuoha, Chinwendu V, NP   650 mg at 04/19/22 1412   alum & mag hydroxide-simeth (MAALOX/MYLANTA) 200-200-20 MG/5ML suspension 30 mL  30 mL Oral Q4H PRN Onuoha, Chinwendu V, NP   30 mL at 04/19/22 2333   dicyclomine (BENTYL) tablet 20 mg  20 mg Oral Q6H PRN Jackelyn Poling, NP       hydrOXYzine (ATARAX) tablet 25 mg  25 mg Oral Q6H PRN Nira Conn A, NP       loperamide (IMODIUM) capsule 2-4 mg  2-4 mg Oral PRN Doda, Vandana, MD       LORazepam (ATIVAN) tablet 1 mg  1 mg Oral Q6H PRN Doda, Vandana, MD       magnesium hydroxide (MILK OF MAGNESIA) suspension 30 mL  30 mL Oral Daily PRN Onuoha, Chinwendu V, NP       methocarbamol (ROBAXIN) tablet 500 mg  500 mg Oral Q8H PRN Nira Conn A, NP       multivitamin with  minerals tablet 1 tablet  1 tablet Oral Daily Doda, Vandana, MD   1 tablet at 04/20/22 0811   naproxen (NAPROSYN) tablet 500 mg  500 mg Oral BID PRN Karsten Ro, MD       OLANZapine zydis (ZYPREXA) disintegrating tablet 10 mg  10 mg Oral QHS Nira Conn A, NP   10 mg at 04/19/22 2204   ondansetron (ZOFRAN-ODT) disintegrating tablet 4 mg  4 mg Oral Q6H PRN Karsten Ro, MD       thiamine tablet 100 mg  100 mg  Oral Daily Karsten Ro, MD   100 mg at 04/20/22 6213   Current Outpatient Medications  Medication Sig Dispense Refill   OLANZapine zydis (ZYPREXA) 10 MG disintegrating tablet Take 1 tablet (10 mg total) by mouth daily. 30 tablet 1   traZODone (DESYREL) 50 MG tablet Take 1 tablet (50 mg total) by mouth at bedtime as needed for sleep. 30 tablet 1    PTA Medications: (Not in a hospital admission)      04/19/2022    6:21 AM 02/16/2022    2:05 PM 08/03/2021    3:07 PM  Depression screen PHQ 2/9  Decreased Interest 2 2 0  Down, Depressed, Hopeless 3 3 0  PHQ - 2 Score 5 5 0  Altered sleeping 2 3   Tired, decreased energy 2 2   Change in appetite 2 3   Feeling bad or failure about yourself  3 3   Trouble concentrating 2 3   Moving slowly or fidgety/restless 1 3   Suicidal thoughts 2 2   PHQ-9 Score 19 24   Difficult doing work/chores Very difficult Extremely dIfficult     Flowsheet Row ED from 04/19/2022 in [facility] Most recent reading at 04/19/2022  6:55 AM ED from 04/19/2022 in [facility] EMERGENCY DEPARTMENT Most recent reading at 04/19/2022 12:56 AM ED from 04/16/2022 in [facility] Most recent reading at 04/16/2022  3:58 PM  C-SSRS RISK CATEGORY High Risk No Risk High Risk       Musculoskeletal  Strength & Muscle Tone: within normal limits Gait & Station: normal Patient leans: N/A  Psychiatric Specialty Exam  Presentation  General Appearance: Appropriate for Environment  Eye  Contact:Fair  Speech:Clear and Coherent  Speech Volume:Normal  Handedness:Right   Mood and Affect  Mood:Euthymic  Affect:Congruent   Thought Process  Thought Processes:Coherent; Goal Directed  Descriptions of Associations:Intact  Orientation:Full (Time, Place and Person)  Thought Content:Logical  Diagnosis of Schizophrenia or Schizoaffective disorder in past: Yes  Duration of Psychotic Symptoms: Greater than six months   Hallucinations:Hallucinations: None Description of Auditory Hallucinations: Hearing voices to hurt himself Description of Visual Hallucinations: Denies VH today but usually sees Flying spirits  Ideas of Reference:None  Suicidal Thoughts:Suicidal Thoughts: No SI Active Intent and/or Plan: With Intent; Without Plan  Homicidal Thoughts:Homicidal Thoughts: No   Sensorium  Memory:Immediate Fair; Recent Fair; Remote Fair  Judgment:Fair  Insight:Fair   Executive Functions  Concentration:Fair  Attention Span:Fair  Recall:Fair  Fund of Knowledge:Fair  Language:Fair   Psychomotor Activity  Psychomotor Activity:Psychomotor Activity: Normal   Assets  Assets:Communication Skills; Desire for Improvement; Physical Health; Leisure Time   Sleep  Sleep:Sleep: Fair   Nutritional Assessment (For OBS and FBC admissions only) Has the patient had a weight loss or gain of 10 pounds or more in the last 3 months?: No Has the patient had a decrease in food intake/or appetite?: No Does the patient have dental problems?: No Does the patient have eating habits or behaviors that may be indicators of an eating disorder including binging or inducing vomiting?: No Has the patient recently lost weight without trying?: 0 Has the patient been eating poorly because of a decreased appetite?: 0 Malnutrition Screening Tool Score: 0    Physical Exam  Physical Exam HENT:     Head: Normocephalic.     Nose: Nose normal.  Eyes:     Conjunctiva/sclera:  Conjunctivae normal.  Cardiovascular:     Rate and Rhythm: Bradycardia  present.  Pulmonary:     Effort: Pulmonary effort is normal.  Musculoskeletal:        General: Normal range of motion.     Cervical back: Normal range of motion.  Neurological:     Mental Status: He is alert and oriented to person, place, and time.    Review of Systems  Constitutional: Negative.   HENT: Negative.    Eyes: Negative.   Respiratory: Negative.    Cardiovascular: Negative.   Gastrointestinal: Negative.   Genitourinary: Negative.   Musculoskeletal: Negative.   Skin: Negative.   Neurological: Negative.   Endo/Heme/Allergies: Negative.    Blood pressure 121/78, pulse (!) 57, temperature (!) 97.2 F (36.2 C), temperature source Oral, resp. rate 18, SpO2 100 %. There is no height or weight on file to calculate BMI.  DSuicide Risk:  Minimal: No identifiable suicidal ideation.  Patients presenting with no risk factors but with morbid ruminations; may be classified as minimal risk based on the severity of the depressive symptoms  Plan Of Care/Follow-up recommendations:  Activity:  as tolerated  Disposition: discharge to self. Patient has been accepted to [facility] for substance use residential treatment. Sample medications and prescriptions provided at the time of discharge.   Carle Fenech L, NP 04/20/2022, 8:21 AM

## 2022-04-20 NOTE — ED Notes (Signed)
Patient was discharged to [facility] in [place], [place]. Patient was in stable condition prior to leaving. Patient was given a 30 day supply of his medications including his prescriptions. Patient was transported and escorted by [employer].

## 2022-05-01 ENCOUNTER — Ambulatory Visit: Payer: Medicaid Other | Admitting: Physician Assistant

## 2022-05-01 ENCOUNTER — Encounter: Payer: Self-pay | Admitting: Physician Assistant

## 2022-05-01 VITALS — BP 114/76 | HR 57 | Resp 18 | Ht 72.0 in | Wt 218.0 lb

## 2022-05-01 DIAGNOSIS — M79605 Pain in left leg: Secondary | ICD-10-CM | POA: Diagnosis not present

## 2022-05-01 DIAGNOSIS — B182 Chronic viral hepatitis C: Secondary | ICD-10-CM | POA: Diagnosis not present

## 2022-05-01 DIAGNOSIS — F411 Generalized anxiety disorder: Secondary | ICD-10-CM

## 2022-05-01 DIAGNOSIS — F1510 Other stimulant abuse, uncomplicated: Secondary | ICD-10-CM

## 2022-05-01 DIAGNOSIS — F1410 Cocaine abuse, uncomplicated: Secondary | ICD-10-CM

## 2022-05-01 DIAGNOSIS — F1310 Sedative, hypnotic or anxiolytic abuse, uncomplicated: Secondary | ICD-10-CM

## 2022-05-01 DIAGNOSIS — R7309 Other abnormal glucose: Secondary | ICD-10-CM

## 2022-05-01 DIAGNOSIS — M79604 Pain in right leg: Secondary | ICD-10-CM

## 2022-05-01 DIAGNOSIS — F5104 Psychophysiologic insomnia: Secondary | ICD-10-CM

## 2022-05-01 DIAGNOSIS — F902 Attention-deficit hyperactivity disorder, combined type: Secondary | ICD-10-CM

## 2022-05-01 DIAGNOSIS — J4520 Mild intermittent asthma, uncomplicated: Secondary | ICD-10-CM

## 2022-05-01 DIAGNOSIS — F17210 Nicotine dependence, cigarettes, uncomplicated: Secondary | ICD-10-CM

## 2022-05-01 DIAGNOSIS — F1010 Alcohol abuse, uncomplicated: Secondary | ICD-10-CM

## 2022-05-01 DIAGNOSIS — F200 Paranoid schizophrenia: Secondary | ICD-10-CM

## 2022-05-01 MED ORDER — METHOCARBAMOL 500 MG PO TABS: 500.0000 mg | ORAL_TABLET | Freq: Three times a day (TID) | ORAL | 1 refills | Status: DC | PRN

## 2022-05-01 MED ORDER — NALTREXONE HCL 50 MG PO TABS: 50.0000 mg | ORAL_TABLET | Freq: Every day | ORAL | 1 refills | Status: DC

## 2022-05-01 MED ORDER — QUETIAPINE FUMARATE 50 MG PO TABS: ORAL_TABLET | ORAL | 1 refills | Status: DC

## 2022-05-01 MED ORDER — HYDROXYZINE HCL 25 MG PO TABS
25.0000 mg | ORAL_TABLET | Freq: Four times a day (QID) | ORAL | 1 refills | Status: DC | PRN
Start: 2022-05-01 — End: 2022-07-22

## 2022-05-01 MED ORDER — OLANZAPINE 10 MG PO TBDP: 10.0000 mg | ORAL_TABLET | Freq: Every day | ORAL | 1 refills | Status: DC
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
## 2022-05-01 NOTE — Patient Instructions (Signed)
To help with your sleep, you are going to stop taking trazodone and start taking Seroquel.  You will take 50 mg at bedtime, you may increase this to 100 mg after 3 days.  I have started a referral for you to be seen by infectious disease for further evaluation of hepatitis C.  You will follow-up with the mobile unit in 2 weeks.  Roney Jaffe, PA-C Physician Assistant [facility] [email or web address]   Insomnia Insomnia is a sleep disorder that makes it difficult to fall asleep or stay asleep. Insomnia can cause fatigue, low energy, difficulty concentrating, mood swings, and poor performance at work or school. There are three different ways to classify insomnia: Difficulty falling asleep. Difficulty staying asleep. Waking up too early in the morning. Any type of insomnia can be long-term (chronic) or short-term (acute). Both are common. Short-term insomnia usually lasts for 3 months or less. Chronic insomnia occurs at least three times a week for longer than 3 months. What are the causes? Insomnia may be caused by another condition, situation, or substance, such as: Having certain mental health conditions, such as anxiety and depression. Using caffeine, alcohol, tobacco, or drugs. Having gastrointestinal conditions, such as gastroesophageal reflux disease (GERD). Having certain medical conditions. These include: Asthma. Alzheimer's disease. Stroke. Chronic pain. An overactive thyroid gland (hyperthyroidism). Other sleep disorders, such as restless legs syndrome and sleep apnea. Menopause. Sometimes, the cause of insomnia may not be known. What increases the risk? Risk factors for insomnia include: Gender. Females are affected more often than males. Age. Insomnia is more common as people get older. Stress and certain medical and mental health conditions. Lack of exercise. Having an irregular work schedule. This may include  working night shifts and traveling between different time zones. What are the signs or symptoms? If you have insomnia, the main symptom is having trouble falling asleep or having trouble staying asleep. This may lead to other symptoms, such as: Feeling tired or having low energy. Feeling nervous about going to sleep. Not feeling rested in the morning. Having trouble concentrating. Feeling irritable, anxious, or depressed. How is this diagnosed? This condition may be diagnosed based on: Your symptoms and medical history. Your health care provider may ask about: Your sleep habits. Any medical conditions you have. Your mental health. A physical exam. How is this treated? Treatment for insomnia depends on the cause. Treatment may focus on treating an underlying condition that is causing the insomnia. Treatment may also include: Medicines to help you sleep. Counseling or therapy. Lifestyle adjustments to help you sleep better. Follow these instructions at home: Eating and drinking  Limit or avoid alcohol, caffeinated beverages, and products that contain nicotine and tobacco, especially close to bedtime. These can disrupt your sleep. Do not eat a large meal or eat spicy foods right before bedtime. This can lead to digestive discomfort that can make it hard for you to sleep. Sleep habits  Keep a sleep diary to help you and your health care provider figure out what could be causing your insomnia. Write down: When you sleep. When you wake up during the night. How well you sleep and how rested you feel the next day. Any side effects of medicines you are taking. What you eat and drink. Make your bedroom a dark, comfortable place where it is easy to fall asleep. Put up shades or blackout curtains to block light from outside. Use a white noise machine to block noise. Keep the temperature cool. Limit screen use  before bedtime. This includes: Not watching TV. Not using your smartphone,  tablet, or computer. Stick to a routine that includes going to bed and waking up at the same times every day and night. This can help you fall asleep faster. Consider making a quiet activity, such as reading, part of your nighttime routine. Try to avoid taking naps during the day so that you sleep better at night. Get out of bed if you are still awake after 15 minutes of trying to sleep. Keep the lights down, but try reading or doing a quiet activity. When you feel sleepy, go back to bed. General instructions Take over-the-counter and prescription medicines only as told by your health care provider. Exercise regularly as told by your health care provider. However, avoid exercising in the hours right before bedtime. Use relaxation techniques to manage stress. Ask your health care provider to suggest some techniques that may work well for you. These may include: Breathing exercises. Routines to release muscle tension. Visualizing peaceful scenes. Make sure that you drive carefully. Do not drive if you feel very sleepy. Keep all follow-up visits. This is important. Contact a health care provider if: You are tired throughout the day. You have trouble in your daily routine due to sleepiness. You continue to have sleep problems, or your sleep problems get worse. Get help right away if: You have thoughts about hurting yourself or someone else. Get help right away if you feel like you may hurt yourself or others, or have thoughts about taking your own life. Go to your nearest emergency room or: Call 911. Call the National Suicide Prevention Lifeline at 941 660 8755 or 988. This is open 24 hours a day. Text the Crisis Text Line at 681-291-7643. Summary Insomnia is a sleep disorder that makes it difficult to fall asleep or stay asleep. Insomnia can be long-term (chronic) or short-term (acute). Treatment for insomnia depends on the cause. Treatment may focus on treating an underlying condition that is  causing the insomnia. Keep a sleep diary to help you and your health care provider figure out what could be causing your insomnia. This information is not intended to replace advice given to you by your health care provider. Make sure you discuss any questions you have with your health care provider. Document Revised: 10/10/2021 Document Reviewed: 10/10/2021 Elsevier Patient Education  2023 [employer].

## 2022-05-02 ENCOUNTER — Encounter: Payer: Self-pay | Admitting: Physician Assistant

## 2022-05-02 DIAGNOSIS — F5104 Psychophysiologic insomnia: Secondary | ICD-10-CM | POA: Insufficient documentation

## 2022-05-02 DIAGNOSIS — F1510 Other stimulant abuse, uncomplicated: Secondary | ICD-10-CM | POA: Insufficient documentation

## 2022-05-02 DIAGNOSIS — F1310 Sedative, hypnotic or anxiolytic abuse, uncomplicated: Secondary | ICD-10-CM | POA: Insufficient documentation

## 2022-05-11 ENCOUNTER — Ambulatory Visit (HOSPITAL_COMMUNITY): Payer: Medicaid Other

## 2022-05-13 ENCOUNTER — Telehealth (HOSPITAL_COMMUNITY): Payer: Self-pay

## 2022-05-13 NOTE — BH Assessment (Signed)
Care Management - [facility] Follow Up Discharges   Writer attempted to make contact with patient today and was unsuccessful.  Phone just rang, no voicemail.   Per chart review, patient was provided with outpatient resources.  

## 2022-06-14 ENCOUNTER — Emergency Department (HOSPITAL_COMMUNITY): Payer: Medicaid Other

## 2022-06-14 ENCOUNTER — Encounter (HOSPITAL_COMMUNITY): Payer: Self-pay | Admitting: Emergency Medicine

## 2022-06-14 ENCOUNTER — Other Ambulatory Visit: Payer: Self-pay

## 2022-06-14 ENCOUNTER — Emergency Department (HOSPITAL_COMMUNITY)
Admission: EM | Admit: 2022-06-14 | Discharge: 2022-06-14 | Disposition: A | Discharge status: Home / Self Care | Payer: Medicaid Other | Attending: Emergency Medicine | Admitting: Emergency Medicine

## 2022-06-14 DIAGNOSIS — M545 Low back pain, unspecified: Secondary | ICD-10-CM | POA: Insufficient documentation

## 2022-06-14 DIAGNOSIS — Y92410 Unspecified street and highway as the place of occurrence of the external cause: Secondary | ICD-10-CM | POA: Insufficient documentation

## 2022-06-14 DIAGNOSIS — S40212A Abrasion of left shoulder, initial encounter: Secondary | ICD-10-CM | POA: Diagnosis not present

## 2022-06-14 DIAGNOSIS — S80211A Abrasion, right knee, initial encounter: Secondary | ICD-10-CM | POA: Diagnosis not present

## 2022-06-14 DIAGNOSIS — S62347A Nondisplaced fracture of base of fifth metacarpal bone. left hand, initial encounter for closed fracture: Secondary | ICD-10-CM | POA: Diagnosis not present

## 2022-06-14 DIAGNOSIS — S0081XA Abrasion of other part of head, initial encounter: Secondary | ICD-10-CM | POA: Diagnosis not present

## 2022-06-14 DIAGNOSIS — S0990XA Unspecified injury of head, initial encounter: Secondary | ICD-10-CM | POA: Diagnosis not present

## 2022-06-14 DIAGNOSIS — R109 Unspecified abdominal pain: Secondary | ICD-10-CM | POA: Insufficient documentation

## 2022-06-14 DIAGNOSIS — S169XXA Unspecified injury of muscle, fascia and tendon at neck level, initial encounter: Secondary | ICD-10-CM | POA: Diagnosis present

## 2022-06-14 DIAGNOSIS — S27322A Contusion of lung, bilateral, initial encounter: Secondary | ICD-10-CM | POA: Diagnosis not present

## 2022-06-14 DIAGNOSIS — S129XXA Fracture of neck, unspecified, initial encounter: Secondary | ICD-10-CM | POA: Insufficient documentation

## 2022-06-14 DIAGNOSIS — D649 Anemia, unspecified: Secondary | ICD-10-CM | POA: Diagnosis not present

## 2022-06-14 LAB — COMPREHENSIVE METABOLIC PANEL
ALT: 33 U/L (ref 0–44)
AST: 57 U/L — ABNORMAL HIGH (ref 15–41)
Albumin: 3.5 g/dL (ref 3.5–5.0)
Alkaline Phosphatase: 36 U/L — ABNORMAL LOW (ref 38–126)
Anion gap: 7 (ref 5–15)
BUN: 12 mg/dL (ref 6–20)
CO2: 24 mmol/L (ref 22–32)
Calcium: 8.8 mg/dL — ABNORMAL LOW (ref 8.9–10.3)
Chloride: 104 mmol/L (ref 98–111)
Creatinine, Ser: 0.81 mg/dL (ref 0.61–1.24)
GFR, Estimated: >60 mL/min (ref >60)
Glucose, Bld: 178 mg/dL — ABNORMAL HIGH (ref 70–99)
Potassium: 3.3 mmol/L — ABNORMAL LOW (ref 3.5–5.1)
Sodium: 135 mmol/L (ref 135–145)
Total Bilirubin: 0.8 mg/dL (ref 0.3–1.2)
Total Protein: 7.2 g/dL (ref 6.5–8.1)

## 2022-06-14 LAB — CBC
HCT: 35.2 % — ABNORMAL LOW (ref 39.0–52.0)
Hemoglobin: 12.9 g/dL — ABNORMAL LOW (ref 13.0–17.0)
MCH: 29.7 pg (ref 26.0–34.0)
MCHC: 36.6 g/dL — ABNORMAL HIGH (ref 30.0–36.0)
MCV: 80.9 fL (ref 80.0–100.0)
Platelets: 200 10*3/uL (ref 150–400)
RBC: 4.35 MIL/uL (ref 4.22–5.81)
RDW: 14.5 % (ref 11.5–15.5)
WBC: 10.9 10*3/uL — ABNORMAL HIGH (ref 4.0–10.5)
nRBC: 0 % (ref 0.0–0.2)

## 2022-06-14 LAB — I-STAT CHEM 8, ED
BUN: 13 mg/dL (ref 6–20)
Calcium, Ion: 1.18 mmol/L (ref 1.15–1.40)
Chloride: 102 mmol/L (ref 98–111)
Creatinine, Ser: 0.7 mg/dL (ref 0.61–1.24)
Glucose, Bld: 175 mg/dL — ABNORMAL HIGH (ref 70–99)
HCT: 38 % — ABNORMAL LOW (ref 39.0–52.0)
Hemoglobin: 12.9 g/dL — ABNORMAL LOW (ref 13.0–17.0)
Potassium: 3.3 mmol/L — ABNORMAL LOW (ref 3.5–5.1)
Sodium: 138 mmol/L (ref 135–145)
TCO2: 23 mmol/L (ref 22–32)

## 2022-06-14 LAB — ETHANOL: Alcohol, Ethyl (B): <10 mg/dL (ref <10)

## 2022-06-14 LAB — SAMPLE TO BLOOD BANK

## 2022-06-14 LAB — PROTIME-INR
INR: 1 (ref 0.8–1.2)
Prothrombin Time: 13.4 seconds (ref 11.4–15.2)

## 2022-06-14 LAB — LACTIC ACID, PLASMA: Lactic Acid, Venous: 1 mmol/L (ref 0.5–1.9)

## 2022-06-14 MED ORDER — IOHEXOL 300 MG/ML  SOLN
100.0000 mL | Freq: Once | INTRAMUSCULAR | Status: AC | PRN
  Administered 2022-06-14: 100 mL via INTRAVENOUS

## 2022-06-14 NOTE — ED Notes (Signed)
Ortho called to place splint.

## 2022-06-14 NOTE — ED Notes (Signed)
Patient is in bed. Gave patient something to drink. Encouraged patient to use the urinal for urine sample.
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
## 2022-06-14 NOTE — Progress Notes (Signed)
Orthopedic Tech Progress Note Patient Details:  Dominic [name] [birth year] [id number]  Ortho Devices Type of Ortho Device: Cotton web roll, Ace wrap, Ulna gutter splint Ortho Device/Splint Location: LUE Ortho Device/Splint Interventions: Ordered, Application, Adjustment   Post Interventions Patient Tolerated: Well Instructions Provided: Care of device  Donald Pore 06/14/2022, 3:11 AM

## 2022-06-14 NOTE — ED Notes (Signed)
Patient transported to CT 

## 2022-06-14 NOTE — ED Notes (Signed)
Assumed care of patient.
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
## 2022-06-20 ENCOUNTER — Other Ambulatory Visit: Payer: Self-pay

## 2022-06-20 ENCOUNTER — Emergency Department: Payer: No Typology Code available for payment source

## 2022-06-20 ENCOUNTER — Emergency Department
Admission: EM | Admit: 2022-06-20 | Discharge: 2022-06-21 | Disposition: A | Discharge status: Home / Self Care | Payer: No Typology Code available for payment source | Attending: Emergency Medicine | Admitting: Emergency Medicine

## 2022-06-20 DIAGNOSIS — F1410 Cocaine abuse, uncomplicated: Secondary | ICD-10-CM | POA: Diagnosis not present

## 2022-06-20 DIAGNOSIS — F1420 Cocaine dependence, uncomplicated: Secondary | ICD-10-CM | POA: Insufficient documentation

## 2022-06-20 DIAGNOSIS — F1510 Other stimulant abuse, uncomplicated: Secondary | ICD-10-CM | POA: Diagnosis present

## 2022-06-20 DIAGNOSIS — F1310 Sedative, hypnotic or anxiolytic abuse, uncomplicated: Secondary | ICD-10-CM | POA: Diagnosis present

## 2022-06-20 DIAGNOSIS — S80212D Abrasion, left knee, subsequent encounter: Secondary | ICD-10-CM | POA: Diagnosis not present

## 2022-06-20 DIAGNOSIS — R443 Hallucinations, unspecified: Secondary | ICD-10-CM

## 2022-06-20 DIAGNOSIS — F1020 Alcohol dependence, uncomplicated: Secondary | ICD-10-CM | POA: Diagnosis not present

## 2022-06-20 DIAGNOSIS — F200 Paranoid schizophrenia: Secondary | ICD-10-CM | POA: Insufficient documentation

## 2022-06-20 DIAGNOSIS — Y900 Blood alcohol level of less than 20 mg/100 ml: Secondary | ICD-10-CM | POA: Diagnosis not present

## 2022-06-20 DIAGNOSIS — R440 Auditory hallucinations: Secondary | ICD-10-CM | POA: Diagnosis present

## 2022-06-20 DIAGNOSIS — S80211D Abrasion, right knee, subsequent encounter: Secondary | ICD-10-CM | POA: Insufficient documentation

## 2022-06-20 DIAGNOSIS — F411 Generalized anxiety disorder: Secondary | ICD-10-CM | POA: Diagnosis present

## 2022-06-20 DIAGNOSIS — F15188 Other stimulant abuse with other stimulant-induced disorder: Secondary | ICD-10-CM | POA: Insufficient documentation

## 2022-06-20 DIAGNOSIS — F17210 Nicotine dependence, cigarettes, uncomplicated: Secondary | ICD-10-CM | POA: Insufficient documentation

## 2022-06-20 DIAGNOSIS — Z20822 Contact with and (suspected) exposure to covid-19: Secondary | ICD-10-CM | POA: Insufficient documentation

## 2022-06-20 DIAGNOSIS — F1910 Other psychoactive substance abuse, uncomplicated: Secondary | ICD-10-CM

## 2022-06-20 DIAGNOSIS — Z765 Malingerer [conscious simulation]: Secondary | ICD-10-CM | POA: Diagnosis not present

## 2022-06-20 DIAGNOSIS — F1994 Other psychoactive substance use, unspecified with psychoactive substance-induced mood disorder: Secondary | ICD-10-CM | POA: Diagnosis present

## 2022-06-20 DIAGNOSIS — S0081XD Abrasion of other part of head, subsequent encounter: Secondary | ICD-10-CM | POA: Insufficient documentation

## 2022-06-20 DIAGNOSIS — F1010 Alcohol abuse, uncomplicated: Secondary | ICD-10-CM | POA: Diagnosis present

## 2022-06-20 DIAGNOSIS — J45909 Unspecified asthma, uncomplicated: Secondary | ICD-10-CM | POA: Diagnosis not present

## 2022-06-20 DIAGNOSIS — R45851 Suicidal ideations: Secondary | ICD-10-CM | POA: Diagnosis not present

## 2022-06-20 DIAGNOSIS — F4323 Adjustment disorder with mixed anxiety and depressed mood: Secondary | ICD-10-CM | POA: Insufficient documentation

## 2022-06-20 DIAGNOSIS — F10920 Alcohol use, unspecified with intoxication, uncomplicated: Secondary | ICD-10-CM | POA: Diagnosis present

## 2022-06-20 LAB — COMPREHENSIVE METABOLIC PANEL
ALT: 31 U/L (ref 0–44)
AST: 36 U/L (ref 15–41)
Albumin: 4 g/dL (ref 3.5–5.0)
Alkaline Phosphatase: 37 U/L — ABNORMAL LOW (ref 38–126)
Anion gap: 7 (ref 5–15)
BUN: 18 mg/dL (ref 6–20)
CO2: 24 mmol/L (ref 22–32)
Calcium: 9.1 mg/dL (ref 8.9–10.3)
Chloride: 107 mmol/L (ref 98–111)
Creatinine, Ser: 0.98 mg/dL (ref 0.61–1.24)
GFR, Estimated: >60 mL/min (ref >60)
Glucose, Bld: 106 mg/dL — ABNORMAL HIGH (ref 70–99)
Potassium: 4 mmol/L (ref 3.5–5.1)
Sodium: 138 mmol/L (ref 135–145)
Total Bilirubin: 0.6 mg/dL (ref 0.3–1.2)
Total Protein: 8.3 g/dL — ABNORMAL HIGH (ref 6.5–8.1)

## 2022-06-20 LAB — ACETAMINOPHEN LEVEL: Acetaminophen (Tylenol), Serum: <10 ug/mL — ABNORMAL LOW (ref 10–30)

## 2022-06-20 LAB — CBC
HCT: 37.9 % — ABNORMAL LOW (ref 39.0–52.0)
Hemoglobin: 13.5 g/dL (ref 13.0–17.0)
MCH: 28.8 pg (ref 26.0–34.0)
MCHC: 35.6 g/dL (ref 30.0–36.0)
MCV: 80.8 fL (ref 80.0–100.0)
Platelets: 259 10*3/uL (ref 150–400)
RBC: 4.69 MIL/uL (ref 4.22–5.81)
RDW: 14.2 % (ref 11.5–15.5)
WBC: 14.1 10*3/uL — ABNORMAL HIGH (ref 4.0–10.5)
nRBC: 0 % (ref 0.0–0.2)

## 2022-06-20 LAB — URINE DRUG SCREEN, QUALITATIVE (ARMC ONLY)
Amphetamines, Ur Screen: NOT DETECTED
Barbiturates, Ur Screen: NOT DETECTED
Benzodiazepine, Ur Scrn: NOT DETECTED
Cannabinoid 50 Ng, Ur ~~LOC~~: POSITIVE — AB
Cocaine Metabolite,Ur ~~LOC~~: POSITIVE — AB
MDMA (Ecstasy)Ur Screen: NOT DETECTED
Methadone Scn, Ur: NOT DETECTED
Opiate, Ur Screen: NOT DETECTED
Phencyclidine (PCP) Ur S: NOT DETECTED
Tricyclic, Ur Screen: NOT DETECTED

## 2022-06-20 LAB — ETHANOL: Alcohol, Ethyl (B): 14 mg/dL — ABNORMAL HIGH (ref <10)

## 2022-06-20 LAB — PROCALCITONIN: Procalcitonin: <0.1 ng/mL

## 2022-06-20 LAB — SARS CORONAVIRUS 2 BY RT PCR: SARS Coronavirus 2 by RT PCR: NEGATIVE

## 2022-06-20 LAB — SALICYLATE LEVEL: Salicylate Lvl: <7 mg/dL — ABNORMAL LOW (ref 7.0–30.0)

## 2022-06-20 MED ORDER — LORAZEPAM 2 MG/ML IJ SOLN: 1.0000 mg | INTRAMUSCULAR | Status: DC | PRN

## 2022-06-20 MED ORDER — IBUPROFEN 400 MG PO TABS: 400.0000 mg | ORAL_TABLET | Freq: Four times a day (QID) | ORAL | Status: DC | PRN

## 2022-06-20 MED ORDER — FOLIC ACID 1 MG PO TABS
1.0000 mg | ORAL_TABLET | Freq: Every day | ORAL | Status: DC
  Administered 2022-06-20 – 2022-06-21 (×2): 1 mg via ORAL
  Filled 2022-06-20 (×2): qty 1

## 2022-06-20 MED ORDER — OLANZAPINE 5 MG PO TBDP
10.0000 mg | ORAL_TABLET | Freq: Every day | ORAL | Status: DC
  Administered 2022-06-20 – 2022-06-21 (×2): 10 mg via ORAL
  Filled 2022-06-20 (×2): qty 2

## 2022-06-20 MED ORDER — LORAZEPAM 1 MG PO TABS: 1.0000 mg | ORAL_TABLET | ORAL | Status: DC | PRN

## 2022-06-20 MED ORDER — METHOCARBAMOL 500 MG PO TABS: 500.0000 mg | ORAL_TABLET | Freq: Three times a day (TID) | ORAL | Status: DC | PRN

## 2022-06-20 MED ORDER — HYDROXYZINE HCL 25 MG PO TABS: 25.0000 mg | ORAL_TABLET | Freq: Four times a day (QID) | ORAL | Status: DC | PRN

## 2022-06-20 MED ORDER — THIAMINE HCL 100 MG PO TABS
100.0000 mg | ORAL_TABLET | Freq: Every day | ORAL | Status: DC
  Administered 2022-06-20 – 2022-06-21 (×2): 100 mg via ORAL
  Filled 2022-06-20 (×2): qty 1

## 2022-06-20 MED ORDER — ADULT MULTIVITAMIN W/MINERALS CH
1.0000 | ORAL_TABLET | Freq: Every day | ORAL | Status: DC
  Administered 2022-06-20 – 2022-06-21 (×2): 1 via ORAL
  Filled 2022-06-20 (×2): qty 1

## 2022-06-20 MED ORDER — THIAMINE HCL 100 MG/ML IJ SOLN: 100.0000 mg | Freq: Every day | INTRAMUSCULAR | Status: DC

## 2022-06-20 MED ORDER — ACETAMINOPHEN 500 MG PO TABS: 1000.0000 mg | ORAL_TABLET | Freq: Four times a day (QID) | ORAL | Status: DC | PRN
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
## 2022-06-20 NOTE — ED Notes (Signed)
VOL/  PENDING  CONSULT 

## 2022-06-20 NOTE — ED Notes (Signed)
Pt to xray
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
## 2022-06-20 NOTE — ED Notes (Signed)
Unable to place splint while patient in behavioral area

## 2022-06-20 NOTE — ED Notes (Addendum)
Pt changed into blue scrubs , socks with grippers and BH underwear.  Pt belongings   2 black shoes  2 black socks  Black and red shorts  Black and red tank  [employer] blue wallet  [employer] blue underwear Education officer, museum present
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
## 2022-06-20 NOTE — ED Notes (Signed)
Snack and drink at bedside, pt continues to sleep at this time.

## 2022-06-21 DIAGNOSIS — F1410 Cocaine abuse, uncomplicated: Secondary | ICD-10-CM

## 2022-06-21 NOTE — ED Notes (Signed)
VOL/pending reassessment in the AM 

## 2022-06-21 NOTE — ED Notes (Signed)
Breakfast given.  

## 2022-06-21 NOTE — ED Notes (Signed)
Offered phone to call ride for discharge. States he is taking bus.  Verified correct patient and correct discharge papers given. Pt alert and oriented X 4, stable for discharge. RR even and unlabored, color WNL. Discussed discharge instructions and follow-up as directed. Discharge medications discussed, when prescribed. Pt had opportunity to ask questions, and RN available to provide patient and/or family education. Left with all of belongings.

## 2022-06-21 NOTE — TOC Initial Note (Signed)
Transition of Care [facility]) - Initial/Assessment Note    Patient Details  Name: Dominic [name] MRN: [id number] Date of Birth: Mar 02, 1976  [facility] CM/SW Contact:    Allayne Butcher, RN Phone Number: 06/21/2022, 8:40 AM  Clinical Narrative:                 [facility] consult acknowledged, patient seen by psych and if cleard this morning can be discharged.    TOC signed off.         Patient Goals and CMS Choice        Expected Discharge Plan and Services                                                Prior Living Arrangements/Services                       Activities of Daily Living      Permission Sought/Granted                  Emotional Assessment              Admission diagnosis:  Mental Eval Patient Active Problem List   Diagnosis Date Noted   Psychophysiological insomnia 05/02/2022   Methamphetamine abuse (HCC) 05/02/2022   Benzodiazepine abuse (HCC) 05/02/2022   Adjustment disorder with mixed anxiety and depressed mood 04/14/2022   Substance induced mood disorder (HCC) 04/11/2022   Polysubstance abuse (HCC)    Cocaine use disorder, moderate, dependence (HCC)    Alcohol use disorder, severe, dependence (HCC)    Paranoid schizophrenia (HCC)    Schizoaffective disorder, depressive type (HCC) 12/21/2021   Auditory hallucination 08/05/2021   Insomnia due to other mental disorder 07/27/2021   GAD (generalized anxiety disorder) 07/27/2021   Mild pain 07/27/2021   Left eyelid laceration 07/15/2021   COVID-19 11/19/2020   Malingering 10/13/2020   Schizophrenia, 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   Schizophrenia, chronic condition (HCC) 01/14/2019   Alcohol intoxication, uncomplicated (HCC) 01/13/2019   Asthma 11/25/2018   Nicotine dependence, cigarettes, uncomplicated 11/25/2018   Chronic hepatitis C without hepatic coma (HCC) 05/28/2018   Alcohol abuse 02/16/2016   Cocaine abuse (HCC) 02/16/2016   PCP:  Oneita Hurt  No Pharmacy:   [id number] - [place] - [address]  ([place]) [place] [address] Phone: [phone] Fax: [phone]  [facility] - [place], [place] - [address] [place] [address] Phone: [phone] Fax: [phone]     Social Determinants of Health (SDOH) Interventions    Readmission Risk Interventions     No data to display

## 2022-06-21 NOTE — Discharge Instructions (Signed)
You were cleared for discharge by psychiatry return to the ER if you develop any other concerns

## 2022-06-21 NOTE — Consult Note (Signed)
[facility] Psych ED Progress Note  06/21/2022 11:20 AM Dominic [name]  MRN:  [id number]   Method of visit?: Face to Face   Subjective:  "I know cocaine can cause hallucinations."  Patient was re-evaluated this morning. He is not currently experiencing hallucinations. He is sleepy, but lucid, clear expression of thought. Discussed with patient that cocaine use can cause hallucinations or worsen the ones he may have at baseline. Patient agrees, and agrees that he needs to follow up with outpatient mental health and substance abuse services.  Patient denies suicidal or homicidal ideations.  Patient does not meet criteria for inpatient psychiatric hospitalization.       Principal Problem: Cocaine abuse (HCC) Diagnosis:  Principal Problem:   Cocaine abuse (HCC) Active Problems:   Auditory hallucination   Alcohol abuse   Nicotine dependence, cigarettes, uncomplicated   Alcohol intoxication, uncomplicated (HCC)   Malingering   GAD (generalized anxiety disorder)   Cocaine use disorder, moderate, dependence (HCC)   Alcohol use disorder, severe, dependence (HCC)   Paranoid schizophrenia (HCC)   Substance induced mood disorder (HCC)   Adjustment disorder with mixed anxiety and depressed mood   Methamphetamine abuse (HCC)   Benzodiazepine abuse (HCC)  Total Time spent with patient: 15 minutes  Past Psychiatric History: see previous  Past Medical History:  Past Medical History:  Diagnosis Date   Anxiety    Asthma    Bipolar 1 disorder (HCC)    Chronic hepatitis C without hepatic coma (HCC) 05/28/2018   Depression    Hepatitis C    Hepatitis C antibody positive in blood 05/28/2018   Malingering 03/30/2022   Psychiatry note from [facility] states "directly admitted to fabricating SI and CAH to obtain [psychiatric] admission"   Schizophrenia (HCC)    Sleep apnea     Past Surgical History:  Procedure Laterality Date   CYST REMOVAL NECK     neck   Family History:  Family History   Problem Relation Age of Onset   Hypertension Sister    Schizophrenia Sister    Family Psychiatric  History:  Social History:  Social History   Substance and Sexual Activity  Alcohol Use Yes   Alcohol/week: 3.0 standard drinks of alcohol   Types: 3 Shots of liquor per week     Social History   Substance and Sexual Activity  Drug Use Yes   Types: Cocaine, Marijuana, Methamphetamines    Social History   Socioeconomic History   Marital status: Single    Spouse name: Not on file   Number of children: Not on file   Years of education: Not on file   Highest education level: Not on file  Occupational History   Not on file  Tobacco Use   Smoking status: Some Days    Packs/day: 1.00    Types: Cigarettes   Smokeless tobacco: Never  Vaping Use   Vaping Use: Never used  Substance and Sexual Activity   Alcohol use: Yes    Alcohol/week: 3.0 standard drinks of alcohol    Types: 3 Shots of liquor per week   Drug use: Yes    Types: Cocaine, Marijuana, Methamphetamines   Sexual activity: Not Currently  Other Topics Concern   Not on file  Social History Narrative   Not on file   Social Determinants of Health   Financial Resource Strain: Low Risk  (11/25/2018)   Overall Financial Resource Strain (CARDIA)    Difficulty of Paying Living Expenses: Not very hard  Food Insecurity:  No Food Insecurity (11/25/2018)   Hunger Vital Sign    Worried About Running Out of Food in the Last Year: Never true    Ran Out of Food in the Last Year: Never true  Transportation Needs: No Transportation Needs (11/25/2018)   PRAPARE - Administrator, Civil Service (Medical): No    Lack of Transportation (Non-Medical): No  Physical Activity: Inactive (11/25/2018)   Exercise Vital Sign    Days of Exercise per Week: 0 days    Minutes of Exercise per Session: 0 min  Stress: No Stress Concern Present (11/25/2018)   [employer] of Occupational Health - Occupational Stress Questionnaire     Feeling of Stress : Only a little  Social Connections: Unknown (11/25/2018)   Social Connection and Isolation Panel [NHANES]    Frequency of Communication with Friends and Family: Twice a week    Frequency of Social Gatherings with Friends and Family: Twice a week    Attends Religious Services: Never    Database administrator or Organizations: No    Attends Engineer, structural: Never    Marital Status: Patient refused    Sleep: Good  Appetite:  Good  Current Medications: Current Facility-Administered Medications  Medication Dose Route Frequency Provider Last Rate Last Admin   acetaminophen (TYLENOL) tablet 1,000 mg  1,000 mg Oral Q6H PRN Gilles Chiquito, MD       folic acid (FOLVITE) tablet 1 mg  1 mg Oral Daily Gilles Chiquito, MD   1 mg at 06/21/22 0858   hydrOXYzine (ATARAX) tablet 25 mg  25 mg Oral Q6H PRN Gilles Chiquito, MD       ibuprofen (ADVIL) tablet 400 mg  400 mg Oral Q6H PRN Gilles Chiquito, MD       LORazepam (ATIVAN) tablet 1-4 mg  1-4 mg Oral Q1H PRN Gilles Chiquito, MD       Or   LORazepam (ATIVAN) injection 1-4 mg  1-4 mg Intravenous Q1H PRN Gilles Chiquito, MD       methocarbamol (ROBAXIN) tablet 500 mg  500 mg Oral Q8H PRN Gilles Chiquito, MD       multivitamin with minerals tablet 1 tablet  1 tablet Oral Daily Gilles Chiquito, MD   1 tablet at 06/21/22 0857   OLANZapine zydis (ZYPREXA) disintegrating tablet 10 mg  10 mg Oral Daily Gilles Chiquito, MD   10 mg at 06/21/22 8295   thiamine (VITAMIN B1) tablet 100 mg  100 mg Oral Daily Gilles Chiquito, MD   100 mg at 06/21/22 6213   Or   thiamine (VITAMIN B1) injection 100 mg  100 mg Intravenous Daily Gilles Chiquito, MD       Current Outpatient Medications  Medication Sig Dispense Refill   hydrOXYzine (ATARAX) 25 MG tablet Take 1 tablet (25 mg total) by mouth every 6 (six) hours as needed. 60 tablet 1   methocarbamol (ROBAXIN) 500 MG tablet Take 1 tablet (500 mg total) by mouth every 8 (eight)  hours as needed for muscle spasms. 30 tablet 1   naltrexone (DEPADE) 50 MG tablet Take 1 tablet (50 mg total) by mouth daily. 30 tablet 1   OLANZapine zydis (ZYPREXA) 10 MG disintegrating tablet Take 1 tablet (10 mg total) by mouth daily. 30 tablet 1   QUEtiapine (SEROQUEL) 50 MG tablet Take 1 tab PO QHS, may increase to 2 tabs PO after 3 days 60 tablet 1  sertraline (ZOLOFT) 50 MG tablet Take 50 mg by mouth daily.      Lab Results:  Results for orders placed or performed during the hospital encounter of 06/20/22 (from the past 48 hour(s))  Comprehensive metabolic panel     Status: Abnormal   Collection Time: 06/20/22  4:28 PM  Result Value Ref Range   Sodium 138 135 - 145 mmol/L   Potassium 4.0 3.5 - 5.1 mmol/L   Chloride 107 98 - 111 mmol/L   CO2 24 22 - 32 mmol/L   Glucose, Bld 106 (H) 70 - 99 mg/dL    Comment: Glucose reference range applies only to samples taken after fasting for at least 8 hours.   BUN 18 6 - 20 mg/dL   Creatinine, Ser 1.61 0.61 - 1.24 mg/dL   Calcium 9.1 8.9 - 09.6 mg/dL   Total Protein 8.3 (H) 6.5 - 8.1 g/dL   Albumin 4.0 3.5 - 5.0 g/dL   AST 36 15 - 41 U/L   ALT 31 0 - 44 U/L   Alkaline Phosphatase 37 (L) 38 - 126 U/L   Total Bilirubin 0.6 0.3 - 1.2 mg/dL   GFR, Estimated >04 >54 mL/min    Comment: (NOTE) Calculated using the CKD-EPI Creatinine Equation (2021)    Anion gap 7 5 - 15    Comment: Performed at [facility], [address] [place], [place] [address]  Ethanol     Status: Abnormal   Collection Time: 06/20/22  4:28 PM  Result Value Ref Range   Alcohol, Ethyl (B) 14 (H) <10 mg/dL    Comment: (NOTE) Lowest detectable limit for serum alcohol is 10 mg/dL.  For medical purposes only. Performed at [facility], [address] [place], [place] [address]   Salicylate level     Status: Abnormal   Collection Time: 06/20/22  4:28 PM  Result Value Ref Range   Salicylate Lvl <7.0 (L) 7.0 - 30.0 mg/dL    Comment: Performed at  [facility], [address] [place], [place] [address]  Acetaminophen level     Status: Abnormal   Collection Time: 06/20/22  4:28 PM  Result Value Ref Range   Acetaminophen (Tylenol), Serum <10 (L) 10 - 30 ug/mL    Comment: (NOTE) Therapeutic concentrations vary significantly. A range of 10-30 ug/mL  may be an effective concentration for many patients. However, some  are best treated at concentrations outside of this range. Acetaminophen concentrations >150 ug/mL at 4 hours after ingestion  and >50 ug/mL at 12 hours after ingestion are often associated with  toxic reactions.  Performed at [facility], [address] [place], [place] [address]   cbc     Status: Abnormal   Collection Time: 06/20/22  4:28 PM  Result Value Ref Range   WBC 14.1 (H) 4.0 - 10.5 K/uL   RBC 4.69 4.22 - 5.81 MIL/uL   Hemoglobin 13.5 13.0 - 17.0 g/dL   HCT 57.8 (L) 46.9 - 62.9 %   MCV 80.8 80.0 - 100.0 fL   MCH 28.8 26.0 - 34.0 pg   MCHC 35.6 30.0 - 36.0 g/dL   RDW 52.8 41.3 - 24.4 %   Platelets 259 150 - 400 K/uL   nRBC 0.0 0.0 - 0.2 %    Comment: Performed at [facility], [address], [place], [place] [address]  Procalcitonin - Baseline     Status: None   Collection Time: 06/20/22  4:28 PM  Result Value  Ref Range   Procalcitonin <0.10 ng/mL    Comment:        Interpretation: PCT (Procalcitonin) <= 0.5 ng/mL: Systemic infection (sepsis) is not likely. Local bacterial infection is possible. (NOTE)       Sepsis PCT Algorithm           Lower Respiratory Tract                                      Infection PCT Algorithm    ----------------------------     ----------------------------         PCT < 0.25 ng/mL                PCT < 0.10 ng/mL          Strongly encourage             Strongly discourage   discontinuation of antibiotics    initiation of antibiotics    ----------------------------     -----------------------------       PCT 0.25 - 0.50 ng/mL             PCT 0.10 - 0.25 ng/mL               OR       >80% decrease in PCT            Discourage initiation of                                            antibiotics      Encourage discontinuation           of antibiotics    ----------------------------     -----------------------------         PCT >= 0.50 ng/mL              PCT 0.26 - 0.50 ng/mL               AND        <80% decrease in PCT             Encourage initiation of                                             antibiotics       Encourage continuation           of antibiotics    ----------------------------     -----------------------------        PCT >= 0.50 ng/mL                  PCT > 0.50 ng/mL               AND         increase in PCT                  Strongly encourage                                      initiation of antibiotics    Strongly encourage escalation  of antibiotics                                     -----------------------------                                           PCT <= 0.25 ng/mL                                                 OR                                        > 80% decrease in PCT                                      Discontinue / Do not initiate                                             antibiotics  Performed at [facility], [address] [place], [place] [address]   SARS Coronavirus 2 by RT PCR (hospital order, performed in [facility] hospital lab) *cepheid single result test* Anterior Nasal Swab     Status: None   Collection Time: 06/20/22  5:07 PM   Specimen: Anterior Nasal Swab  Result Value Ref Range   SARS Coronavirus 2 by RT PCR NEGATIVE NEGATIVE    Comment: (NOTE) SARS-CoV-2 target nucleic acids are NOT DETECTED.  The SARS-CoV-2 RNA is generally detectable in upper and lower respiratory specimens during the acute phase of infection. The lowest concentration of SARS-CoV-2 viral copies this assay can detect is 250 copies / mL. A negative result does not  preclude SARS-CoV-2 infection and should not be used as the sole basis for treatment or other patient management decisions.  A negative result may occur with improper specimen collection / handling, submission of specimen other than nasopharyngeal swab, presence of viral mutation(s) within the areas targeted by this assay, and inadequate number of viral copies (<250 copies / mL). A negative result must be combined with clinical observations, patient history, and epidemiological information.  Fact Sheet for Patients:   RoadLapTop.co.za  Fact Sheet for Healthcare Providers: http://kim-miller.com/  This test is not yet approved or  cleared by the [place] FDA and has been authorized for detection and/or diagnosis of SARS-CoV-2 by FDA under an Emergency Use Authorization (EUA).  This EUA will remain in effect (meaning this test can be used) for the duration of the COVID-19 declaration under Section 564(b)(1) of the Act, 21 U.S.C. section 360bbb-3(b)(1), unless the authorization is terminated or revoked sooner.  Performed at [facility], [address] [place], [place] [address]   Urine Drug Screen, Qualitative     Status: Abnormal   Collection Time: 06/20/22  5:26 PM  Result Value Ref Range   Tricyclic, Ur Screen NONE DETECTED NONE DETECTED   Amphetamines, Ur Screen  NONE DETECTED NONE DETECTED   MDMA (Ecstasy)Ur Screen NONE DETECTED NONE DETECTED   Cocaine Metabolite,Ur [place] POSITIVE (A) NONE DETECTED   Opiate, Ur Screen NONE DETECTED NONE DETECTED   Phencyclidine (PCP) Ur S NONE DETECTED NONE DETECTED   Cannabinoid 50 Ng, Ur [place] POSITIVE (A) NONE DETECTED   Barbiturates, Ur Screen NONE DETECTED NONE DETECTED   Benzodiazepine, Ur Scrn NONE DETECTED NONE DETECTED   Methadone Scn, Ur NONE DETECTED NONE DETECTED    Comment: (NOTE) Tricyclics + metabolites, urine    Cutoff 1000 ng/mL Amphetamines + metabolites, urine  Cutoff  1000 ng/mL MDMA (Ecstasy), urine              Cutoff 500 ng/mL Cocaine Metabolite, urine          Cutoff 300 ng/mL Opiate + metabolites, urine        Cutoff 300 ng/mL Phencyclidine (PCP), urine         Cutoff 25 ng/mL Cannabinoid, urine                 Cutoff 50 ng/mL Barbiturates + metabolites, urine  Cutoff 200 ng/mL Benzodiazepine, urine              Cutoff 200 ng/mL Methadone, urine                   Cutoff 300 ng/mL  The urine drug screen provides only a preliminary, unconfirmed analytical test result and should not be used for non-medical purposes. Clinical consideration and professional judgment should be applied to any positive drug screen result due to possible interfering substances. A more specific alternate chemical method must be used in order to obtain a confirmed analytical result. Gas chromatography / mass spectrometry (GC/MS) is the preferred confirm atory method. Performed at [facility], [address] [place], [place] [id number]     Blood Alcohol level:  Lab Results  Component Value Date   ETH 14 (H) 06/20/2022   ETH <10 06/14/2022    Physical Findings: AIMS:  , ,  ,  ,    CIWA:    COWS:     Musculoskeletal: Strength & Muscle Tone: within normal limits Gait & Station: normal Patient leans: N/A  Psychiatric Specialty Exam:  Presentation  General Appearance: Casual  Eye Contact:Fair  Speech:Clear and Coherent  Speech Volume:Normal  Handedness:Right   Mood and Affect  Mood:Euthymic  Affect:Congruent; Appropriate   Thought Process  Thought Processes:Coherent  Descriptions of Associations:Intact  Orientation:Full (Time, Place and Person)  Thought Content:WDL  History of Schizophrenia/Schizoaffective disorder:Yes  Duration of Psychotic Symptoms:Greater than six months  Hallucinations:Hallucinations: None  Ideas of Reference:None  Suicidal Thoughts:Suicidal Thoughts: No  Homicidal Thoughts:Homicidal Thoughts:  No   Sensorium  Memory:Immediate Good  Judgment:Poor  Insight:Poor   Executive Functions  Concentration:Fair  Attention Span:Fair  Recall:Fair  Fund of Knowledge:Fair  Language:Fair   Psychomotor Activity  Psychomotor Activity:Psychomotor Activity: Normal   Assets  Assets:Financial Resources/Insurance; Resilience   Sleep  Sleep:Sleep: Good    Physical Exam: Physical Exam Vitals and nursing note reviewed.  HENT:     Head: Normocephalic.     Mouth/Throat:     Pharynx: No oropharyngeal exudate or posterior oropharyngeal erythema.  Eyes:     General:        Right eye: No discharge.        Left eye: No discharge.  Cardiovascular:     Rate and Rhythm: Normal rate.  Pulmonary:     Effort: Pulmonary effort is  normal.  Musculoskeletal:        General: Normal range of motion.  Skin:    General: Skin is dry.  Neurological:     Mental Status: He is oriented to person, place, and time.  Psychiatric:        Attention and Perception: Attention normal.        Mood and Affect: Mood normal.        Speech: Speech normal.        Behavior: Behavior normal. Behavior is cooperative.        Thought Content: Thought content is not paranoid or delusional. Thought content does not include homicidal or suicidal ideation.        Cognition and Memory: Cognition normal.    Review of Systems  Constitutional: Negative.   HENT: Negative.    Eyes: Negative.   Respiratory: Negative.    Cardiovascular: Negative.   Neurological: Negative.   Psychiatric/Behavioral:  Positive for hallucinations (hx of AH, none current) and substance abuse. Negative for memory loss and suicidal ideas. The patient is not nervous/anxious and does not have insomnia.    Blood pressure (!) 140/76, pulse 60, temperature 98.3 F (36.8 C), temperature source Oral, resp. rate 14, height  (1.803 m), weight 99.3 kg, SpO2 98 %. Body mass index is 30.53 kg/m.  Treatment Plan Summary: Plan Discharge  with outpatient resources. Reviewed with EDP  Vanetta Mulders, NP 06/21/2022, 11:20 AM

## 2022-06-21 NOTE — ED Notes (Signed)
Hospital meal provided.  100% consumed, pt tolerated w/o complaints.  Waste discarded appropriately.   

## 2022-07-09 ENCOUNTER — Other Ambulatory Visit: Payer: Self-pay

## 2022-07-09 DIAGNOSIS — Z8616 Personal history of COVID-19: Secondary | ICD-10-CM | POA: Diagnosis not present

## 2022-07-09 DIAGNOSIS — J45909 Unspecified asthma, uncomplicated: Secondary | ICD-10-CM | POA: Insufficient documentation

## 2022-07-09 DIAGNOSIS — F1420 Cocaine dependence, uncomplicated: Secondary | ICD-10-CM | POA: Diagnosis not present

## 2022-07-09 DIAGNOSIS — F203 Undifferentiated schizophrenia: Secondary | ICD-10-CM | POA: Insufficient documentation

## 2022-07-09 DIAGNOSIS — F3132 Bipolar disorder, current episode depressed, moderate: Secondary | ICD-10-CM | POA: Insufficient documentation

## 2022-07-09 DIAGNOSIS — F1020 Alcohol dependence, uncomplicated: Secondary | ICD-10-CM | POA: Insufficient documentation

## 2022-07-09 DIAGNOSIS — F1510 Other stimulant abuse, uncomplicated: Secondary | ICD-10-CM | POA: Diagnosis not present

## 2022-07-09 DIAGNOSIS — F17210 Nicotine dependence, cigarettes, uncomplicated: Secondary | ICD-10-CM | POA: Insufficient documentation

## 2022-07-09 DIAGNOSIS — Z765 Malingerer [conscious simulation]: Secondary | ICD-10-CM | POA: Insufficient documentation

## 2022-07-09 DIAGNOSIS — F1290 Cannabis use, unspecified, uncomplicated: Secondary | ICD-10-CM | POA: Insufficient documentation

## 2022-07-09 DIAGNOSIS — Y900 Blood alcohol level of less than 20 mg/100 ml: Secondary | ICD-10-CM | POA: Diagnosis not present

## 2022-07-09 DIAGNOSIS — F4323 Adjustment disorder with mixed anxiety and depressed mood: Secondary | ICD-10-CM | POA: Insufficient documentation

## 2022-07-09 DIAGNOSIS — F1410 Cocaine abuse, uncomplicated: Secondary | ICD-10-CM | POA: Diagnosis not present

## 2022-07-09 DIAGNOSIS — Z046 Encounter for general psychiatric examination, requested by authority: Secondary | ICD-10-CM | POA: Diagnosis present

## 2022-07-09 NOTE — ED Triage Notes (Signed)
Per bpd officer pt attempted to run out into traffic tonight to kill himself and was stopped by police "forcefully" per officer. Pt states he is suicidal, was not hit by vehicle tonight.

## 2022-07-10 ENCOUNTER — Emergency Department
Admission: EM | Admit: 2022-07-10 | Discharge: 2022-07-10 | Disposition: A | Discharge status: Home / Self Care | Payer: No Typology Code available for payment source | Attending: Emergency Medicine | Admitting: Emergency Medicine

## 2022-07-10 DIAGNOSIS — F3132 Bipolar disorder, current episode depressed, moderate: Secondary | ICD-10-CM

## 2022-07-10 DIAGNOSIS — F209 Schizophrenia, unspecified: Secondary | ICD-10-CM

## 2022-07-10 DIAGNOSIS — F1290 Cannabis use, unspecified, uncomplicated: Secondary | ICD-10-CM

## 2022-07-10 DIAGNOSIS — F203 Undifferentiated schizophrenia: Secondary | ICD-10-CM | POA: Diagnosis present

## 2022-07-10 DIAGNOSIS — F1410 Cocaine abuse, uncomplicated: Secondary | ICD-10-CM | POA: Diagnosis not present

## 2022-07-10 LAB — CBC
HCT: 38.4 % — ABNORMAL LOW (ref 39.0–52.0)
Hemoglobin: 13.6 g/dL (ref 13.0–17.0)
MCH: 28.5 pg (ref 26.0–34.0)
MCHC: 35.4 g/dL (ref 30.0–36.0)
MCV: 80.5 fL (ref 80.0–100.0)
Platelets: 233 10*3/uL (ref 150–400)
RBC: 4.77 MIL/uL (ref 4.22–5.81)
RDW: 14.2 % (ref 11.5–15.5)
WBC: 10.5 10*3/uL (ref 4.0–10.5)
nRBC: 0 % (ref 0.0–0.2)

## 2022-07-10 LAB — COMPREHENSIVE METABOLIC PANEL
ALT: 26 U/L (ref 0–44)
AST: 28 U/L (ref 15–41)
Albumin: 4.1 g/dL (ref 3.5–5.0)
Alkaline Phosphatase: 43 U/L (ref 38–126)
Anion gap: 8 (ref 5–15)
BUN: 11 mg/dL (ref 6–20)
CO2: 23 mmol/L (ref 22–32)
Calcium: 9.1 mg/dL (ref 8.9–10.3)
Chloride: 108 mmol/L (ref 98–111)
Creatinine, Ser: 0.71 mg/dL (ref 0.61–1.24)
GFR, Estimated: >60 mL/min (ref >60)
Glucose, Bld: 96 mg/dL (ref 70–99)
Potassium: 3.7 mmol/L (ref 3.5–5.1)
Sodium: 139 mmol/L (ref 135–145)
Total Bilirubin: 0.7 mg/dL (ref 0.3–1.2)
Total Protein: 8.4 g/dL — ABNORMAL HIGH (ref 6.5–8.1)

## 2022-07-10 LAB — URINE DRUG SCREEN, QUALITATIVE (ARMC ONLY)
Amphetamines, Ur Screen: NOT DETECTED
Barbiturates, Ur Screen: NOT DETECTED
Benzodiazepine, Ur Scrn: NOT DETECTED
Cannabinoid 50 Ng, Ur ~~LOC~~: POSITIVE — AB
Cocaine Metabolite,Ur ~~LOC~~: POSITIVE — AB
MDMA (Ecstasy)Ur Screen: NOT DETECTED
Methadone Scn, Ur: NOT DETECTED
Opiate, Ur Screen: NOT DETECTED
Phencyclidine (PCP) Ur S: NOT DETECTED
Tricyclic, Ur Screen: NOT DETECTED

## 2022-07-10 LAB — ACETAMINOPHEN LEVEL: Acetaminophen (Tylenol), Serum: <10 ug/mL — ABNORMAL LOW (ref 10–30)

## 2022-07-10 LAB — ETHANOL: Alcohol, Ethyl (B): <10 mg/dL (ref <10)

## 2022-07-10 LAB — SALICYLATE LEVEL: Salicylate Lvl: <7 mg/dL — ABNORMAL LOW (ref 7.0–30.0)

## 2022-07-10 MED ORDER — ACETAMINOPHEN 325 MG PO TABS
650.0000 mg | ORAL_TABLET | Freq: Once | ORAL | Status: AC
  Administered 2022-07-10: 650 mg via ORAL
  Filled 2022-07-10: qty 2

## 2022-07-10 MED ORDER — ZIPRASIDONE MESYLATE 20 MG IM SOLR
INTRAMUSCULAR | Status: AC
  Administered 2022-07-10: 20 mg
  Filled 2022-07-10: qty 20

## 2022-07-10 MED ORDER — ZIPRASIDONE MESYLATE 20 MG IM SOLR: 20.0000 mg | Freq: Once | INTRAMUSCULAR | Status: DC

## 2022-07-10 NOTE — ED Notes (Signed)
TTS in speaking with patient. 

## 2022-07-10 NOTE — ED Notes (Addendum)
In to give patient medication, patient contracts for good behavior.  Explained to patient if he continued to be verbally aggressive and posturing with security and staff he would be given medication.  Patient states he is chill and wants to sleep until psych arrives to speak with him.

## 2022-07-10 NOTE — Consult Note (Signed)
[facility] Face-to-Face Psychiatry Consult   Reason for Consult:  behavior issue Referring Physician:  EDP Patient Identification: Dominic [name] MRN:  [id number] Principal Diagnosis: Cocaine abuse (HCC) Diagnosis:  Principal Problem:   Cocaine abuse (HCC) Active Problems:   Schizophrenia, undifferentiated (HCC)   Total Time spent with patient: 30 minutes  Subjective:   Dominic [name] is a 46 y.o. male patient admitted with behavior in the context of cocaine abuse.  HPI: Patient presented to the ED last evening under IVC, being [place] police [place] the [place] stepping out in the part of traffic.  At that time he states that he was suicidal.  On evaluation today, patient is lucid, coherent and speaking in linear and logical sentences.  He denies having any suicidal or homicidal ideations.  UDS was positive for cocaine and cannabis.  Patient states that he really just wants to follow up with [facility].  Patient denies auditory or visual hallucinations.  Does not appear to be responding to internal stimuli.  Denies paranoia.  He is calm and cooperative.  He requests discharge.  He no longer meets criteria for IVC.  IVC rescinded.  [facility] representative, Lorella Nimrod, came up to see the patient and will pick patient up at his group home to bring him to our ED for outpatient services.  Past Psychiatric History: Schizophrenia; substance abuse  Risk to Self:   Risk to Others:   Prior Inpatient Therapy:   Prior Outpatient Therapy:    Past Medical History:  Past Medical History:  Diagnosis Date   Anxiety    Asthma    Bipolar 1 disorder (HCC)    Chronic hepatitis C without hepatic coma (HCC) 05/28/2018   Depression    Hepatitis C    Hepatitis C antibody positive in blood 05/28/2018   Malingering 03/30/2022   Psychiatry note from [facility] states "directly admitted to fabricating SI and CAH to obtain [psychiatric] admission"   Schizophrenia (HCC)    Sleep apnea     Past Surgical  History:  Procedure Laterality Date   CYST REMOVAL NECK     neck   Family History:  Family History  Problem Relation Age of Onset   Hypertension Sister    Schizophrenia Sister    Family Psychiatric  History:  Social History:  Social History   Substance and Sexual Activity  Alcohol Use Yes   Alcohol/week: 3.0 standard drinks of alcohol   Types: 3 Shots of liquor per week     Social History   Substance and Sexual Activity  Drug Use Yes   Types: Cocaine, Marijuana, Methamphetamines    Social History   Socioeconomic History   Marital status: Single    Spouse name: Not on file   Number of children: Not on file   Years of education: Not on file   Highest education level: Not on file  Occupational History   Not on file  Tobacco Use   Smoking status: Some Days    Packs/day: 1.00    Types: Cigarettes   Smokeless tobacco: Never  Vaping Use   Vaping Use: Never used  Substance and Sexual Activity   Alcohol use: Yes    Alcohol/week: 3.0 standard drinks of alcohol    Types: 3 Shots of liquor per week   Drug use: Yes    Types: Cocaine, Marijuana, Methamphetamines   Sexual activity: Not Currently  Other Topics Concern   Not on file  Social History Narrative   Not on file  Social Determinants of Health   Financial Resource Strain: Low Risk  (11/25/2018)   Overall Financial Resource Strain (CARDIA)    Difficulty of Paying Living Expenses: Not very hard  Food Insecurity: No Food Insecurity (11/25/2018)   Hunger Vital Sign    Worried About Running Out of Food in the Last Year: Never true    Ran Out of Food in the Last Year: Never true  Transportation Needs: No Transportation Needs (11/25/2018)   PRAPARE - Administrator, Civil Service (Medical): No    Lack of Transportation (Non-Medical): No  Physical Activity: Inactive (11/25/2018)   Exercise Vital Sign    Days of Exercise per Week: 0 days    Minutes of Exercise per Session: 0 min  Stress: No Stress  Concern Present (11/25/2018)   [employer] of Occupational Health - Occupational Stress Questionnaire    Feeling of Stress : Only a little  Social Connections: Unknown (11/25/2018)   Social Connection and Isolation Panel [NHANES]    Frequency of Communication with Friends and Family: Twice a week    Frequency of Social Gatherings with Friends and Family: Twice a week    Attends Religious Services: Never    Database administrator or Organizations: No    Attends Banker Meetings: Never    Marital Status: Patient refused   Additional Social History:    Allergies:  No Known Allergies  Labs:  Results for orders placed or performed during the hospital encounter of 07/10/22 (from the past 48 hour(s))  Comprehensive metabolic panel     Status: Abnormal   Collection Time: 07/09/22 11:59 PM  Result Value Ref Range   Sodium 139 135 - 145 mmol/L   Potassium 3.7 3.5 - 5.1 mmol/L   Chloride 108 98 - 111 mmol/L   CO2 23 22 - 32 mmol/L   Glucose, Bld 96 70 - 99 mg/dL    Comment: Glucose reference range applies only to samples taken after fasting for at least 8 hours.   BUN 11 6 - 20 mg/dL   Creatinine, Ser 2.35 0.61 - 1.24 mg/dL   Calcium 9.1 8.9 - 57.3 mg/dL   Total Protein 8.4 (H) 6.5 - 8.1 g/dL   Albumin 4.1 3.5 - 5.0 g/dL   AST 28 15 - 41 U/L   ALT 26 0 - 44 U/L   Alkaline Phosphatase 43 38 - 126 U/L   Total Bilirubin 0.7 0.3 - 1.2 mg/dL   GFR, Estimated >22 >02 mL/min    Comment: (NOTE) Calculated using the CKD-EPI Creatinine Equation (2021)    Anion gap 8 5 - 15    Comment: Performed at [facility], [address] [place], [place] [address]  Ethanol     Status: None   Collection Time: 07/09/22 11:59 PM  Result Value Ref Range   Alcohol, Ethyl (B) <10 <10 mg/dL    Comment: (NOTE) Lowest detectable limit for serum alcohol is 10 mg/dL.  For medical purposes only. Performed at [facility], [address] [place], [place] [address]    Salicylate level     Status: Abnormal   Collection Time: 07/09/22 11:59 PM  Result Value Ref Range   Salicylate Lvl <7.0 (L) 7.0 - 30.0 mg/dL    Comment: Performed at [facility], [address] [place], [place] [address]  Acetaminophen level     Status: Abnormal   Collection Time: 07/09/22 11:59 PM  Result Value Ref Range   Acetaminophen (Tylenol),  Serum <10 (L) 10 - 30 ug/mL    Comment: (NOTE) Therapeutic concentrations vary significantly. A range of 10-30 ug/mL  may be an effective concentration for many patients. However, some  are best treated at concentrations outside of this range. Acetaminophen concentrations >150 ug/mL at 4 hours after ingestion  and >50 ug/mL at 12 hours after ingestion are often associated with  toxic reactions.  Performed at [facility], [address] [place], [place] [address]   cbc     Status: Abnormal   Collection Time: 07/09/22 11:59 PM  Result Value Ref Range   WBC 10.5 4.0 - 10.5 K/uL   RBC 4.77 4.22 - 5.81 MIL/uL   Hemoglobin 13.6 13.0 - 17.0 g/dL   HCT 92.1 (L) 19.4 - 17.4 %   MCV 80.5 80.0 - 100.0 fL   MCH 28.5 26.0 - 34.0 pg   MCHC 35.4 30.0 - 36.0 g/dL   RDW 08.1 44.8 - 18.5 %   Platelets 233 150 - 400 K/uL   nRBC 0.0 0.0 - 0.2 %    Comment: Performed at [facility], [address], [place], [place] [address]  Urine Drug Screen, Qualitative     Status: Abnormal   Collection Time: 07/10/22  1:17 AM  Result Value Ref Range   Tricyclic, Ur Screen NONE DETECTED NONE DETECTED   Amphetamines, Ur Screen NONE DETECTED NONE DETECTED   MDMA (Ecstasy)Ur Screen NONE DETECTED NONE DETECTED   Cocaine Metabolite,Ur [place] POSITIVE (A) NONE DETECTED   Opiate, Ur Screen NONE DETECTED NONE DETECTED   Phencyclidine (PCP) Ur S NONE DETECTED NONE DETECTED   Cannabinoid 50 Ng, Ur  POSITIVE (A) NONE DETECTED   Barbiturates, Ur Screen NONE DETECTED NONE DETECTED   Benzodiazepine, Ur Scrn NONE DETECTED NONE DETECTED    Methadone Scn, Ur NONE DETECTED NONE DETECTED    Comment: (NOTE) Tricyclics + metabolites, urine    Cutoff 1000 ng/mL Amphetamines + metabolites, urine  Cutoff 1000 ng/mL MDMA (Ecstasy), urine              Cutoff 500 ng/mL Cocaine Metabolite, urine          Cutoff 300 ng/mL Opiate + metabolites, urine        Cutoff 300 ng/mL Phencyclidine (PCP), urine         Cutoff 25 ng/mL Cannabinoid, urine                 Cutoff 50 ng/mL Barbiturates + metabolites, urine  Cutoff 200 ng/mL Benzodiazepine, urine              Cutoff 200 ng/mL Methadone, urine                   Cutoff 300 ng/mL  The urine drug screen provides only a preliminary, unconfirmed analytical test result and should not be used for non-medical purposes. Clinical consideration and professional judgment should be applied to any positive drug screen result due to possible interfering substances. A more specific alternate chemical method must be used in order to obtain a confirmed analytical result. Gas chromatography / mass spectrometry (GC/MS) is the preferred confirm atory method. Performed at [facility], [address] [place], [place] [address]     No current facility-administered medications for this encounter.   Current Outpatient Medications  Medication Sig Dispense Refill   hydrOXYzine (ATARAX) 25 MG tablet Take 1 tablet (25 mg total) by mouth every 6 (six) hours as needed. (Patient not taking: Reported on 07/10/2022)  60 tablet 1   methocarbamol (ROBAXIN) 500 MG tablet Take 1 tablet (500 mg total) by mouth every 8 (eight) hours as needed for muscle spasms. (Patient not taking: Reported on 07/10/2022) 30 tablet 1   naltrexone (DEPADE) 50 MG tablet Take 1 tablet (50 mg total) by mouth daily. (Patient not taking: Reported on 07/10/2022) 30 tablet 1   OLANZapine zydis (ZYPREXA) 10 MG disintegrating tablet Take 1 tablet (10 mg total) by mouth daily. 30 tablet 1   QUEtiapine (SEROQUEL) 50 MG tablet Take 1 tab PO  QHS, may increase to 2 tabs PO after 3 days (Patient not taking: Reported on 07/10/2022) 60 tablet 1   sertraline (ZOLOFT) 50 MG tablet Take 50 mg by mouth daily. (Patient not taking: Reported on 07/10/2022)      Musculoskeletal: Strength & Muscle Tone: within normal limits Gait & Station: normal Patient leans: N/A  Psychiatric Specialty Exam:  Presentation  General Appearance: Casual  Eye Contact:Fair  Speech:Clear and Coherent  Speech Volume:Normal  Handedness:Right   Mood and Affect  Mood:Euthymic  Affect:Congruent; Appropriate   Thought Process  Thought Processes:Coherent  Descriptions of Associations:Intact  Orientation:Full (Time, Place and Person)  Thought Content:WDL  History of Schizophrenia/Schizoaffective disorder:Yes  Duration of Psychotic Symptoms:Greater than six months  Hallucinations:No data recorded Ideas of Reference:None  Suicidal Thoughts:No data recorded Homicidal Thoughts:No data recorded  Sensorium  Memory:Immediate Good  Judgment:Poor  Insight:Poor   Executive Functions  Concentration:Fair  Attention Span:Fair  Recall:Fair  Fund of Knowledge:Fair  Language:Fair   Psychomotor Activity  Psychomotor Activity:No data recorded  Assets  Assets:Financial Resources/Insurance; Resilience   Sleep  Sleep:No data recorded  Physical Exam: Physical Exam Vitals and nursing note reviewed.  HENT:     Head: Normocephalic.     Nose: No congestion or rhinorrhea.  Eyes:     General:        Right eye: No discharge.        Left eye: No discharge.  Cardiovascular:     Rate and Rhythm: Normal rate.  Pulmonary:     Effort: Pulmonary effort is normal.  Musculoskeletal:        General: Normal range of motion.     Cervical back: Normal range of motion.  Skin:    General: Skin is dry.  Neurological:     Mental Status: He is alert and oriented to person, place, and time.  Psychiatric:        Attention and Perception: Attention  normal.        Mood and Affect: Mood normal.        Speech: Speech normal.        Behavior: Behavior normal.        Thought Content: Thought content normal. Thought content is not paranoid or delusional. Thought content does not include homicidal or suicidal ideation.        Cognition and Memory: Cognition is impaired.        Judgment: Judgment normal.    Review of Systems  Constitutional: Negative.   HENT: Negative.    Respiratory: Negative.    Cardiovascular: Negative.   Musculoskeletal: Negative.   Skin: Negative.   Neurological: Negative.   Psychiatric/Behavioral:  Positive for substance abuse. Negative for depression, hallucinations, memory loss and suicidal ideas. The patient is not nervous/anxious and does not have insomnia.        Schizoaffective disorder   Blood pressure 113/66, pulse 62, temperature 97.9 F (36.6 C), temperature source Oral, resp. rate 18, height 5\' 11"  (  1.803 m), weight 99 kg, SpO2 100 %. Body mass index is 30.44 kg/m.  Treatment Plan Summary: Plan patient will follow-up with [facility] for outpatient services.  Reviewed with EDP  Disposition: No evidence of imminent risk to self or others at present.   Supportive therapy provided about ongoing stressors. Discussed crisis plan, support from social network, calling 911, coming to the Emergency Department, and calling Suicide Hotline.  Vanetta MuldersLouise F Lyrique Hakim, NP 07/10/2022 7:18 PM

## 2022-07-10 NOTE — ED Notes (Signed)
IVC PAPERS RESCINDED PER NP LOUISE

## 2022-07-10 NOTE — ED Notes (Signed)
IVC prior to arrival/Psych Consult Ordered/Pending

## 2022-07-10 NOTE — ED Notes (Signed)
IVC  PENDING  CONSULT   MOVED  TO  [facility]

## 2022-07-10 NOTE — ED Notes (Signed)
Breakfast tray given to patient at this time.

## 2022-07-10 NOTE — BH Assessment (Signed)
Pt's nurse Dawn, was provided with substance abuse resources for pt when pt discharges.

## 2022-07-10 NOTE — ED Notes (Signed)
Pt given snack. 

## 2022-07-10 NOTE — ED Notes (Signed)
Patient in hallway arguing with security that he is going to leave.  Security and staff explaining to patient that he is IVC and he can not leave.  Patient continues to be verbally aggressive.  MD notified and medications ordered.

## 2022-07-10 NOTE — ED Provider Notes (Signed)
-----------------------------------------   10:43 AM on 07/10/2022 ----------------------------------------- Patient has been seen and evaluated by psychiatry.  Psychiatry believes the patient is safe for discharge home from a psychiatric standpoint.  The patient's medical work-up is been largely nonrevealing besides cocaine and cannabinoid urine drug screen.  CBC and chemistry shows no concerning findings.  Given the patient's reassuring medical work-up reassuring vitals I believe the patient is safe for discharge home from a medical standpoint.  We will discharge the patient with outpatient resources.   Minna Antis, MD 07/10/22 1045

## 2022-07-10 NOTE — ED Provider Notes (Signed)
[facility] Provider Note    Event Date/Time   First MD Initiated Contact with Patient 07/10/22 0241     (approximate)   History   IVC   HPI  Dominic [name] is a 46 y.o. male brought to the ED by [facility] police under IVC.  Patient with a history of bipolar disorder, schizophrenia, substance abuse who attempted to run out into traffic tonight to kill himself.  He was stopped by police.  He was not struck by a vehicle.  Denies active HI/AH/VH.  Voices no medical complaints.     Past Medical History   Past Medical History:  Diagnosis Date   Anxiety    Asthma    Bipolar 1 disorder ([place])    Chronic hepatitis C without hepatic coma ([place]) 05/28/2018   Depression    Hepatitis C    Hepatitis C antibody positive in blood 05/28/2018   Malingering 03/30/2022   Psychiatry note from [facility] states "directly admitted to fabricating SI and CAH to obtain [psychiatric] admission"   Schizophrenia [facility])    Sleep apnea      Active Problem List   Patient Active Problem List   Diagnosis Date Noted   Psychophysiological insomnia 05/02/2022   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   Adjustment disorder with mixed anxiety and depressed mood 04/14/2022   Substance induced mood disorder ([place]) 04/11/2022   Polysubstance abuse ([place])    Cocaine use disorder, moderate, dependence ([place])    Alcohol use disorder, severe, dependence ([place])    Paranoid schizophrenia ([place])    Schizoaffective disorder, depressive type ([place]) 12/21/2021   Auditory hallucination 08/05/2021   Insomnia due to other mental disorder 07/27/2021   GAD (generalized anxiety disorder) 07/27/2021   Mild pain 07/27/2021   Left eyelid laceration 07/15/2021   COVID-19 11/19/2020   Malingering 10/13/2020   Schizophrenia, undifferentiated ([place]) 05/22/2019   Schizophrenia, chronic condition ([place]) 01/14/2019   Alcohol intoxication, uncomplicated ([place]) [id number]    Asthma 11/25/2018   Nicotine dependence, cigarettes, uncomplicated 0000000   Chronic hepatitis C without hepatic coma ([place]) 05/28/2018   Alcohol abuse 02/16/2016   Cocaine abuse ([place]) 02/16/2016     Past Surgical History   Past Surgical History:  Procedure Laterality Date   CYST REMOVAL NECK     neck     Home Medications   Prior to Admission medications   Medication Sig Start Date End Date Taking? Authorizing Provider  hydrOXYzine (ATARAX) 25 MG tablet Take 1 tablet (25 mg total) by mouth every 6 (six) hours as needed. Patient not taking: Reported on 07/10/2022 05/01/22   Mayers, Cari S, PA-C  methocarbamol (ROBAXIN) 500 MG tablet Take 1 tablet (500 mg total) by mouth every 8 (eight) hours as needed for muscle spasms. Patient not taking: Reported on 07/10/2022 05/01/22   Mayers, Cari S, PA-C  naltrexone (DEPADE) 50 MG tablet Take 1 tablet (50 mg total) by mouth daily. Patient not taking: Reported on 07/10/2022 05/01/22   Mayers, Cari S, PA-C  OLANZapine zydis (ZYPREXA) 10 MG disintegrating tablet Take 1 tablet (10 mg total) by mouth daily. 05/01/22 06/30/22  Mayers, Cari S, PA-C  QUEtiapine (SEROQUEL) 50 MG tablet Take 1 tab PO QHS, may increase to 2 tabs PO after 3 days Patient not taking: Reported on 07/10/2022 05/01/22   Mayers, Cari S, PA-C  sertraline (ZOLOFT) 50 MG tablet Take 50 mg by mouth daily. Patient not taking: Reported on 07/10/2022 05/11/22   [provider]     Allergies  Patient has no known allergies.   Family History   Family History  Problem Relation Age of Onset   Hypertension Sister    Schizophrenia Sister      Physical Exam  Triage Vital Signs: ED Triage Vitals  Enc Vitals Group     BP 07/09/22 2355 113/88     Pulse Rate 07/09/22 2355 74     Resp 07/09/22 2355 14     Temp 07/09/22 2355 98 F (36.7 C)     Temp Source 07/09/22 2355 Oral     SpO2 07/09/22 2355 94 %     Weight 07/09/22 2356 218 lb 4.1 oz (99 kg)     Height 07/09/22 2356  5\' 11"  (1.803 m)     Head Circumference --      Peak Flow --      Pain Score 07/09/22 2356 10     Pain Loc --      Pain Edu? --      Excl. in GC? --     Updated Vital Signs: BP 113/88 (BP Location: Left Arm)   Pulse 74   Temp 98 F (36.7 C) (Oral)   Resp 20   Ht 5\' 11"  (1.803 m)   Wt 99 kg   SpO2 94%   BMI 30.44 kg/m    General: Awake, no distress.  CV:  RRR.  Good peripheral perfusion.  Resp:  Normal effort.  CTA B. Abd:  Nontender.  No distention.  Other:  Flat affect.   ED Results / Procedures / Treatments  Labs (all labs ordered are listed, but only abnormal results are displayed) Labs Reviewed  COMPREHENSIVE METABOLIC PANEL - Abnormal; Notable for the following components:      Result Value   Total Protein 8.4 (*)    All other components within normal limits  SALICYLATE LEVEL - Abnormal; Notable for the following components:   Salicylate Lvl <7.0 (*)    All other components within normal limits  ACETAMINOPHEN LEVEL - Abnormal; Notable for the following components:   Acetaminophen (Tylenol), Serum <10 (*)    All other components within normal limits  CBC - Abnormal; Notable for the following components:   HCT 38.4 (*)    All other components within normal limits  URINE DRUG SCREEN, QUALITATIVE ([facility] ONLY) - Abnormal; Notable for the following components:   Cocaine Metabolite,Ur [place] POSITIVE (*)    Cannabinoid 50 Ng, Ur [place] POSITIVE (*)    All other components within normal limits  RESP PANEL BY RT-PCR (FLU A&B, COVID) ARPGX2  ETHANOL     EKG  None   RADIOLOGY None   Official radiology report(s): No results found.   PROCEDURES:  Critical Care performed: No  Procedures   MEDICATIONS ORDERED IN ED: Medications  ziprasidone (GEODON) injection 20 mg (has no administration in time range)  acetaminophen (TYLENOL) tablet 650 mg (650 mg Oral Given 07/10/22 0438)  ziprasidone (GEODON) 20 MG injection (20 mg  Given 07/10/22 0504)     IMPRESSION  / MDM / ASSESSMENT AND PLAN / ED COURSE  I reviewed the triage vital signs and the nursing notes.                             46 year old male who presents under IVC for SI.  Laboratory results unremarkable.  UDS positive for cocaine and cannabinoids.  Patient is medically cleared for psychiatric evaluation and  disposition. The patient has been placed in psychiatric observation due to the need to provide a safe environment for the patient while obtaining psychiatric consultation and evaluation, as well as ongoing medical and medication management to treat the patient's condition.  The patient has been placed under full IVC at this time.   Patient's presentation is most consistent with exacerbation of chronic illness.  3734 Patient was agitated because per hospital policy, he could not have additional food or drink after he drank 3 cups of ginger ale plus a sandwich tray.  He was attempting to leave the premises and was unable to be verbally redirected, requiring IM calming agent.  Patient received IM Geodon with success and is currently resting comfortably no acute distress.  FINAL CLINICAL IMPRESSION(S) / ED DIAGNOSES   Final diagnoses:  Bipolar affective disorder, currently depressed, moderate (HCC)  Schizophrenia, unspecified type (HCC)  Cocaine abuse (HCC)  Marijuana use     Rx / DC Orders   ED Discharge Orders     None        Note:  This document was prepared using Dragon voice recognition software and may include unintentional dictation errors.   Irean Hong, MD 07/10/22 [id number]

## 2022-07-10 NOTE — ED Notes (Signed)
Patient reports "I need my injection, I'm not myself, I can't think."

## 2022-07-10 NOTE — ED Notes (Signed)
Informed RN Sue Lush ivc rescinded
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
## 2022-07-10 NOTE — ED Notes (Signed)
Pt dressed out: white t shirt, black sweat pants, black sneakers, plaid boxers placed in one labeled bag.

## 2022-07-14 ENCOUNTER — Encounter
Admission: EM | Discharge status: Against Medical Advice | Payer: Self-pay | Source: Home / Self Care | Attending: Internal Medicine

## 2022-07-14 ENCOUNTER — Emergency Department: Payer: Medicaid Other

## 2022-07-14 ENCOUNTER — Encounter: Payer: Self-pay | Admitting: Emergency Medicine

## 2022-07-14 ENCOUNTER — Inpatient Hospital Stay: Payer: Medicaid Other | Admitting: Anesthesiology

## 2022-07-14 ENCOUNTER — Inpatient Hospital Stay
Admission: EM | Admit: 2022-07-14 | Discharge: 2022-07-16 | DRG: 493 | Discharge status: Against Medical Advice | Payer: Medicaid Other | Attending: Internal Medicine | Admitting: Internal Medicine

## 2022-07-14 ENCOUNTER — Inpatient Hospital Stay: Payer: Medicaid Other

## 2022-07-14 ENCOUNTER — Other Ambulatory Visit: Payer: Self-pay

## 2022-07-14 DIAGNOSIS — F250 Schizoaffective disorder, bipolar type: Secondary | ICD-10-CM | POA: Diagnosis present

## 2022-07-14 DIAGNOSIS — F419 Anxiety disorder, unspecified: Secondary | ICD-10-CM | POA: Diagnosis not present

## 2022-07-14 DIAGNOSIS — F10120 Alcohol abuse with intoxication, uncomplicated: Secondary | ICD-10-CM | POA: Diagnosis present

## 2022-07-14 DIAGNOSIS — Z79899 Other long term (current) drug therapy: Secondary | ICD-10-CM | POA: Diagnosis not present

## 2022-07-14 DIAGNOSIS — S82291B Other fracture of shaft of right tibia, initial encounter for open fracture type I or II: Secondary | ICD-10-CM | POA: Diagnosis present

## 2022-07-14 DIAGNOSIS — G4730 Sleep apnea, unspecified: Secondary | ICD-10-CM | POA: Diagnosis present

## 2022-07-14 DIAGNOSIS — D509 Iron deficiency anemia, unspecified: Secondary | ICD-10-CM | POA: Diagnosis present

## 2022-07-14 DIAGNOSIS — B182 Chronic viral hepatitis C: Secondary | ICD-10-CM | POA: Diagnosis present

## 2022-07-14 DIAGNOSIS — S82201B Unspecified fracture of shaft of right tibia, initial encounter for open fracture type I or II: Principal | ICD-10-CM | POA: Diagnosis present

## 2022-07-14 DIAGNOSIS — Z5329 Procedure and treatment not carried out because of patient's decision for other reasons: Secondary | ICD-10-CM | POA: Diagnosis present

## 2022-07-14 DIAGNOSIS — F10129 Alcohol abuse with intoxication, unspecified: Secondary | ICD-10-CM | POA: Diagnosis present

## 2022-07-14 DIAGNOSIS — F17210 Nicotine dependence, cigarettes, uncomplicated: Secondary | ICD-10-CM | POA: Diagnosis present

## 2022-07-14 DIAGNOSIS — Z23 Encounter for immunization: Secondary | ICD-10-CM

## 2022-07-14 DIAGNOSIS — F203 Undifferentiated schizophrenia: Secondary | ICD-10-CM | POA: Diagnosis present

## 2022-07-14 DIAGNOSIS — Z818 Family history of other mental and behavioral disorders: Secondary | ICD-10-CM

## 2022-07-14 DIAGNOSIS — F1410 Cocaine abuse, uncomplicated: Secondary | ICD-10-CM | POA: Diagnosis present

## 2022-07-14 DIAGNOSIS — F209 Schizophrenia, unspecified: Secondary | ICD-10-CM | POA: Diagnosis present

## 2022-07-14 DIAGNOSIS — F1010 Alcohol abuse, uncomplicated: Secondary | ICD-10-CM | POA: Diagnosis present

## 2022-07-14 DIAGNOSIS — J45909 Unspecified asthma, uncomplicated: Secondary | ICD-10-CM | POA: Diagnosis present

## 2022-07-14 DIAGNOSIS — S82401B Unspecified fracture of shaft of right fibula, initial encounter for open fracture type I or II: Secondary | ICD-10-CM | POA: Diagnosis present

## 2022-07-14 DIAGNOSIS — F10139 Alcohol abuse with withdrawal, unspecified: Secondary | ICD-10-CM | POA: Diagnosis present

## 2022-07-14 DIAGNOSIS — F319 Bipolar disorder, unspecified: Secondary | ICD-10-CM | POA: Diagnosis present

## 2022-07-14 HISTORY — PX: INTRAMEDULLARY (IM) NAIL FIBULA: SHX5874

## 2022-07-14 HISTORY — PX: TIBIA IM NAIL INSERTION: SHX2516

## 2022-07-14 LAB — COMPREHENSIVE METABOLIC PANEL
ALT: 29 U/L (ref 0–44)
AST: 37 U/L (ref 15–41)
Albumin: 4.1 g/dL (ref 3.5–5.0)
Alkaline Phosphatase: 46 U/L (ref 38–126)
Anion gap: 12 (ref 5–15)
BUN: 16 mg/dL (ref 6–20)
CO2: 17 mmol/L — ABNORMAL LOW (ref 22–32)
Calcium: 9.1 mg/dL (ref 8.9–10.3)
Chloride: 108 mmol/L (ref 98–111)
Creatinine, Ser: 0.86 mg/dL (ref 0.61–1.24)
GFR, Estimated: >60 mL/min (ref >60)
Glucose, Bld: 175 mg/dL — ABNORMAL HIGH (ref 70–99)
Potassium: 3.4 mmol/L — ABNORMAL LOW (ref 3.5–5.1)
Sodium: 137 mmol/L (ref 135–145)
Total Bilirubin: 1 mg/dL (ref 0.3–1.2)
Total Protein: 8.3 g/dL — ABNORMAL HIGH (ref 6.5–8.1)

## 2022-07-14 LAB — URINE DRUG SCREEN, QUALITATIVE (ARMC ONLY)
Amphetamines, Ur Screen: NOT DETECTED
Barbiturates, Ur Screen: NOT DETECTED
Benzodiazepine, Ur Scrn: POSITIVE — AB
Cannabinoid 50 Ng, Ur ~~LOC~~: POSITIVE — AB
Cocaine Metabolite,Ur ~~LOC~~: POSITIVE — AB
MDMA (Ecstasy)Ur Screen: NOT DETECTED
Methadone Scn, Ur: NOT DETECTED
Opiate, Ur Screen: NOT DETECTED
Phencyclidine (PCP) Ur S: NOT DETECTED
Tricyclic, Ur Screen: NOT DETECTED

## 2022-07-14 LAB — CBC WITH DIFFERENTIAL/PLATELET
Abs Immature Granulocytes: 0.09 10*3/uL — ABNORMAL HIGH (ref 0.00–0.07)
Basophils Absolute: 0.1 10*3/uL (ref 0.0–0.1)
Basophils Relative: 0 %
Eosinophils Absolute: 0 10*3/uL (ref 0.0–0.5)
Eosinophils Relative: 0 %
HCT: 38.5 % — ABNORMAL LOW (ref 39.0–52.0)
Hemoglobin: 13.7 g/dL (ref 13.0–17.0)
Immature Granulocytes: 1 %
Lymphocytes Relative: 16 %
Lymphs Abs: 2.5 10*3/uL (ref 0.7–4.0)
MCH: 28.7 pg (ref 26.0–34.0)
MCHC: 35.6 g/dL (ref 30.0–36.0)
MCV: 80.7 fL (ref 80.0–100.0)
Monocytes Absolute: 1 10*3/uL (ref 0.1–1.0)
Monocytes Relative: 6 %
Neutro Abs: 12.5 10*3/uL — ABNORMAL HIGH (ref 1.7–7.7)
Neutrophils Relative %: 77 %
Platelets: 240 10*3/uL (ref 150–400)
RBC: 4.77 MIL/uL (ref 4.22–5.81)
RDW: 14.1 % (ref 11.5–15.5)
WBC: 16.2 10*3/uL — ABNORMAL HIGH (ref 4.0–10.5)
nRBC: 0 % (ref 0.0–0.2)

## 2022-07-14 LAB — GLUCOSE, CAPILLARY: Glucose-Capillary: 107 mg/dL — ABNORMAL HIGH (ref 70–99)

## 2022-07-14 LAB — SAMPLE TO BLOOD BANK

## 2022-07-14 LAB — ACETAMINOPHEN LEVEL: Acetaminophen (Tylenol), Serum: <10 ug/mL — ABNORMAL LOW (ref 10–30)

## 2022-07-14 LAB — ETHANOL: Alcohol, Ethyl (B): 155 mg/dL — ABNORMAL HIGH (ref <10)

## 2022-07-14 LAB — GLUCOSE, RANDOM: Glucose, Bld: 106 mg/dL — ABNORMAL HIGH (ref 70–99)

## 2022-07-14 LAB — SALICYLATE LEVEL: Salicylate Lvl: <7 mg/dL — ABNORMAL LOW (ref 7.0–30.0)

## 2022-07-14 SURGERY — INSERTION, INTRAMEDULLARY ROD, TIBIA
Anesthesia: General | Laterality: Right

## 2022-07-14 MED ORDER — FENTANYL CITRATE (PF) 100 MCG/2ML IJ SOLN
INTRAMUSCULAR | Status: DC | PRN
Start: 2022-07-14 — End: 2022-07-14
  Administered 2022-07-14 (×5): 50 ug via INTRAVENOUS

## 2022-07-14 MED ORDER — ACETAMINOPHEN 10 MG/ML IV SOLN
INTRAVENOUS | Status: DC | PRN
  Administered 2022-07-14: 1000 mg via INTRAVENOUS

## 2022-07-14 MED ORDER — DEXMEDETOMIDINE HCL IN NACL 200 MCG/50ML IV SOLN
INTRAVENOUS | Status: DC | PRN
  Administered 2022-07-14 (×3): 8 ug via INTRAVENOUS

## 2022-07-14 MED ORDER — 0.9 % SODIUM CHLORIDE (POUR BTL) OPTIME
TOPICAL | Status: DC | PRN
  Administered 2022-07-14 (×2): 1000 mL

## 2022-07-14 MED ORDER — HYDROMORPHONE HCL 1 MG/ML IJ SOLN
0.5000 mg | INTRAMUSCULAR | Status: DC | PRN
  Administered 2022-07-15 – 2022-07-16 (×3): 1 mg via INTRAVENOUS
  Filled 2022-07-14 (×4): qty 1

## 2022-07-14 MED ORDER — HALOPERIDOL LACTATE 5 MG/ML IJ SOLN
INTRAMUSCULAR | Status: AC
  Filled 2022-07-14: qty 1

## 2022-07-14 MED ORDER — MENTHOL 3 MG MT LOZG: 1.0000 | LOZENGE | OROMUCOSAL | Status: DC | PRN

## 2022-07-14 MED ORDER — FENTANYL CITRATE (PF) 100 MCG/2ML IJ SOLN: 25.0000 ug | INTRAMUSCULAR | Status: DC | PRN

## 2022-07-14 MED ORDER — OXYCODONE HCL 5 MG PO TABS: 5.0000 mg | ORAL_TABLET | Freq: Once | ORAL | Status: DC | PRN

## 2022-07-14 MED ORDER — TETANUS-DIPHTH-ACELL PERTUSSIS 5-2.5-18.5 LF-MCG/0.5 IM SUSY
0.5000 mL | PREFILLED_SYRINGE | Freq: Once | INTRAMUSCULAR | Status: AC
Start: 2022-07-14 — End: 2022-07-14
  Administered 2022-07-14: 0.5 mL via INTRAMUSCULAR
  Filled 2022-07-14: qty 0.5

## 2022-07-14 MED ORDER — OLANZAPINE 10 MG PO TBDP
10.0000 mg | ORAL_TABLET | Freq: Every day | ORAL | Status: DC
  Administered 2022-07-15: 10 mg via ORAL
  Filled 2022-07-14: qty 1

## 2022-07-14 MED ORDER — METOCLOPRAMIDE HCL 5 MG/ML IJ SOLN: 5.0000 mg | Freq: Three times a day (TID) | INTRAMUSCULAR | Status: DC | PRN

## 2022-07-14 MED ORDER — BISACODYL 5 MG PO TBEC: 5.0000 mg | DELAYED_RELEASE_TABLET | Freq: Every day | ORAL | Status: DC | PRN

## 2022-07-14 MED ORDER — LORAZEPAM 2 MG/ML IJ SOLN
INTRAMUSCULAR | Status: AC
  Filled 2022-07-14: qty 1

## 2022-07-14 MED ORDER — CEFAZOLIN SODIUM-DEXTROSE 2-4 GM/100ML-% IV SOLN
2.0000 g | Freq: Four times a day (QID) | INTRAVENOUS | Status: AC
  Administered 2022-07-14 – 2022-07-15 (×3): 2 g via INTRAVENOUS
  Filled 2022-07-14 (×3): qty 100

## 2022-07-14 MED ORDER — ROCURONIUM BROMIDE 100 MG/10ML IV SOLN
INTRAVENOUS | Status: DC | PRN
  Administered 2022-07-14 (×3): 50 mg via INTRAVENOUS

## 2022-07-14 MED ORDER — METHOCARBAMOL 1000 MG/10ML IJ SOLN: 500.0000 mg | Freq: Four times a day (QID) | INTRAVENOUS | Status: DC | PRN

## 2022-07-14 MED ORDER — ONDANSETRON HCL 4 MG/2ML IJ SOLN: 4.0000 mg | Freq: Four times a day (QID) | INTRAMUSCULAR | Status: DC | PRN

## 2022-07-14 MED ORDER — SUGAMMADEX SODIUM 500 MG/5ML IV SOLN
INTRAVENOUS | Status: DC | PRN
  Administered 2022-07-14: 326.4 mg via INTRAVENOUS

## 2022-07-14 MED ORDER — LORAZEPAM 2 MG/ML IJ SOLN
1.0000 mg | INTRAMUSCULAR | Status: DC | PRN
  Filled 2022-07-14: qty 2

## 2022-07-14 MED ORDER — ASPIRIN 325 MG PO TBEC
325.0000 mg | DELAYED_RELEASE_TABLET | Freq: Every day | ORAL | Status: DC
  Administered 2022-07-15: 325 mg via ORAL
  Filled 2022-07-14: qty 1

## 2022-07-14 MED ORDER — PROPOFOL 10 MG/ML IV BOLUS
INTRAVENOUS | Status: DC | PRN
  Administered 2022-07-14: 80 mg via INTRAVENOUS

## 2022-07-14 MED ORDER — LORAZEPAM 1 MG PO TABS: 1.0000 mg | ORAL_TABLET | ORAL | Status: DC | PRN

## 2022-07-14 MED ORDER — PHENYLEPHRINE 80 MCG/ML (10ML) SYRINGE FOR IV PUSH (FOR BLOOD PRESSURE SUPPORT)
PREFILLED_SYRINGE | INTRAVENOUS | Status: DC | PRN
  Administered 2022-07-14: 80 ug via INTRAVENOUS

## 2022-07-14 MED ORDER — PHENYLEPHRINE HCL (PRESSORS) 10 MG/ML IV SOLN
INTRAVENOUS | Status: AC
  Filled 2022-07-14: qty 1

## 2022-07-14 MED ORDER — PHENOL 1.4 % MT LIQD: 1.0000 | OROMUCOSAL | Status: DC | PRN

## 2022-07-14 MED ORDER — LORAZEPAM 2 MG/ML IJ SOLN
2.0000 mg | Freq: Once | INTRAMUSCULAR | Status: AC
  Administered 2022-07-14: 2 mg via INTRAVENOUS

## 2022-07-14 MED ORDER — SUCCINYLCHOLINE CHLORIDE 200 MG/10ML IV SOSY
PREFILLED_SYRINGE | INTRAVENOUS | Status: DC | PRN
  Administered 2022-07-14: 100 mg via INTRAVENOUS

## 2022-07-14 MED ORDER — CEFAZOLIN SODIUM-DEXTROSE 2-3 GM-%(50ML) IV SOLR
INTRAVENOUS | Status: DC | PRN
  Administered 2022-07-14: 2 g via INTRAVENOUS

## 2022-07-14 MED ORDER — LORAZEPAM 2 MG/ML IJ SOLN
INTRAMUSCULAR | Status: AC
  Administered 2022-07-14: 2 mg via INTRAVENOUS
  Filled 2022-07-14: qty 1

## 2022-07-14 MED ORDER — M.V.I. ADULT IV INJ
Freq: Once | INTRAVENOUS | Status: AC
  Filled 2022-07-14: qty 10

## 2022-07-14 MED ORDER — IOHEXOL 300 MG/ML  SOLN
100.0000 mL | Freq: Once | INTRAMUSCULAR | Status: AC | PRN
  Administered 2022-07-14: 100 mL via INTRAVENOUS

## 2022-07-14 MED ORDER — PROMETHAZINE HCL 25 MG/ML IJ SOLN: 6.2500 mg | INTRAMUSCULAR | Status: DC | PRN

## 2022-07-14 MED ORDER — ONDANSETRON HCL 4 MG/2ML IJ SOLN
INTRAMUSCULAR | Status: DC | PRN
  Administered 2022-07-14: 4 mg via INTRAVENOUS

## 2022-07-14 MED ORDER — THIAMINE HCL 100 MG/ML IJ SOLN
100.0000 mg | Freq: Every day | INTRAMUSCULAR | Status: DC
  Administered 2022-07-15 – 2022-07-16 (×2): 100 mg via INTRAVENOUS
  Filled 2022-07-14 (×2): qty 2

## 2022-07-14 MED ORDER — LIDOCAINE HCL (CARDIAC) PF 100 MG/5ML IV SOSY
PREFILLED_SYRINGE | INTRAVENOUS | Status: DC | PRN
  Administered 2022-07-14: 100 mg via INTRAVENOUS

## 2022-07-14 MED ORDER — MIDAZOLAM HCL 2 MG/2ML IJ SOLN: 0.5000 mg | Freq: Once | INTRAMUSCULAR | Status: DC | PRN

## 2022-07-14 MED ORDER — CEFAZOLIN SODIUM-DEXTROSE 2-4 GM/100ML-% IV SOLN
2.0000 g | Freq: Once | INTRAVENOUS | Status: AC
  Administered 2022-07-14: 2 g via INTRAVENOUS
  Filled 2022-07-14: qty 100

## 2022-07-14 MED ORDER — OXYCODONE HCL 5 MG PO TABS: 5.0000 mg | ORAL_TABLET | ORAL | Status: DC | PRN

## 2022-07-14 MED ORDER — HALOPERIDOL LACTATE 5 MG/ML IJ SOLN
5.0000 mg | Freq: Once | INTRAMUSCULAR | Status: AC
  Administered 2022-07-14: 5 mg via INTRAVENOUS

## 2022-07-14 MED ORDER — OXYCODONE HCL 5 MG/5ML PO SOLN: 5.0000 mg | Freq: Once | ORAL | Status: DC | PRN

## 2022-07-14 MED ORDER — LACTATED RINGERS IV SOLN: INTRAVENOUS | Status: DC | PRN

## 2022-07-14 MED ORDER — FENTANYL CITRATE (PF) 100 MCG/2ML IJ SOLN
INTRAMUSCULAR | Status: AC
  Filled 2022-07-14: qty 2

## 2022-07-14 MED ORDER — METOCLOPRAMIDE HCL 5 MG PO TABS: 5.0000 mg | ORAL_TABLET | Freq: Three times a day (TID) | ORAL | Status: DC | PRN

## 2022-07-14 MED ORDER — CELECOXIB 200 MG PO CAPS
200.0000 mg | ORAL_CAPSULE | Freq: Two times a day (BID) | ORAL | Status: DC
  Administered 2022-07-15 – 2022-07-16 (×3): 200 mg via ORAL
  Filled 2022-07-14 (×4): qty 1

## 2022-07-14 MED ORDER — ACETAMINOPHEN 325 MG PO TABS: 325.0000 mg | ORAL_TABLET | Freq: Four times a day (QID) | ORAL | Status: DC | PRN

## 2022-07-14 MED ORDER — NICOTINE 21 MG/24HR TD PT24
21.0000 mg | MEDICATED_PATCH | Freq: Every day | TRANSDERMAL | Status: DC
  Filled 2022-07-14 (×2): qty 1

## 2022-07-14 MED ORDER — MIDAZOLAM HCL 2 MG/2ML IJ SOLN
INTRAMUSCULAR | Status: AC
  Filled 2022-07-14: qty 2

## 2022-07-14 MED ORDER — ACETAMINOPHEN 500 MG PO TABS
1000.0000 mg | ORAL_TABLET | Freq: Three times a day (TID) | ORAL | Status: AC
  Administered 2022-07-15 – 2022-07-16 (×3): 1000 mg via ORAL
  Filled 2022-07-14 (×4): qty 2

## 2022-07-14 MED ORDER — FENTANYL CITRATE (PF) 250 MCG/5ML IJ SOLN
INTRAMUSCULAR | Status: AC
  Filled 2022-07-14: qty 5

## 2022-07-14 MED ORDER — PROPOFOL 10 MG/ML IV BOLUS
INTRAVENOUS | Status: AC
  Filled 2022-07-14: qty 20

## 2022-07-14 MED ORDER — METHOCARBAMOL 500 MG PO TABS: 500.0000 mg | ORAL_TABLET | Freq: Four times a day (QID) | ORAL | Status: DC | PRN

## 2022-07-14 MED ORDER — FOLIC ACID 1 MG PO TABS
1.0000 mg | ORAL_TABLET | Freq: Every day | ORAL | Status: DC
  Administered 2022-07-15 – 2022-07-16 (×2): 1 mg via ORAL
  Filled 2022-07-14 (×2): qty 1

## 2022-07-14 MED ORDER — ONDANSETRON HCL 4 MG PO TABS: 4.0000 mg | ORAL_TABLET | Freq: Four times a day (QID) | ORAL | Status: DC | PRN

## 2022-07-14 MED ORDER — PANTOPRAZOLE SODIUM 40 MG IV SOLR
40.0000 mg | Freq: Two times a day (BID) | INTRAVENOUS | Status: DC
  Administered 2022-07-15 – 2022-07-16 (×4): 40 mg via INTRAVENOUS
  Filled 2022-07-14 (×4): qty 10

## 2022-07-14 MED ORDER — ACETAMINOPHEN 10 MG/ML IV SOLN: 1000.0000 mg | Freq: Once | INTRAVENOUS | Status: DC | PRN

## 2022-07-14 MED ORDER — SODIUM CHLORIDE 0.9 % IV SOLN: INTRAVENOUS | Status: DC | PRN

## 2022-07-14 MED ORDER — LORAZEPAM 2 MG/ML IJ SOLN: 2.0000 mg | Freq: Once | INTRAMUSCULAR | Status: AC

## 2022-07-14 MED ORDER — DROPERIDOL 2.5 MG/ML IJ SOLN
0.6250 mg | Freq: Once | INTRAMUSCULAR | Status: DC | PRN
Start: 2022-07-14 — End: 2022-07-14

## 2022-07-14 MED ORDER — OXYCODONE HCL 5 MG PO TABS
10.0000 mg | ORAL_TABLET | ORAL | Status: DC | PRN
  Administered 2022-07-15 – 2022-07-16 (×2): 10 mg via ORAL
  Filled 2022-07-14 (×2): qty 2

## 2022-07-14 MED ORDER — DOCUSATE SODIUM 100 MG PO CAPS
100.0000 mg | ORAL_CAPSULE | Freq: Two times a day (BID) | ORAL | Status: DC
Start: 2022-07-14 — End: 2022-07-16
  Administered 2022-07-15 – 2022-07-16 (×3): 100 mg via ORAL
  Filled 2022-07-14 (×4): qty 1

## 2022-07-14 SURGICAL SUPPLY — 54 items
BIT DRILL LONG 4.0 (BIT) IMPLANT
BIT DRILL SHORT 4.0 (BIT) IMPLANT
BNDG COHESIVE 6X5 TAN ST LF (GAUZE/BANDAGES/DRESSINGS) ×3 IMPLANT
DRAPE 3/4 80X56 (DRAPES) ×2 IMPLANT
DRAPE C-ARM 42X72 X-RAY (DRAPES) ×1 IMPLANT
DRILL BIT LONG 4.0 (BIT) ×2
DRILL BIT SHORT 4.0 (BIT) ×2
DRSG OPSITE POSTOP 3X4 (GAUZE/BANDAGES/DRESSINGS) ×2 IMPLANT
DRSG OPSITE POSTOP 4X14 (GAUZE/BANDAGES/DRESSINGS) IMPLANT
DRSG OPSITE POSTOP 4X6 (GAUZE/BANDAGES/DRESSINGS) ×1 IMPLANT
DRSG TEGADERM 6X8 (GAUZE/BANDAGES/DRESSINGS) IMPLANT
DURAPREP 26ML APPLICATOR (WOUND CARE) IMPLANT
ELECT REM PT RETURN 9FT ADLT (ELECTROSURGICAL) ×1
ELECTRODE REM PT RTRN 9FT ADLT (ELECTROSURGICAL) ×1 IMPLANT
GAUZE SPONGE 4X4 12PLY STRL (GAUZE/BANDAGES/DRESSINGS) ×1 IMPLANT
GAUZE XEROFORM 1X8 LF (GAUZE/BANDAGES/DRESSINGS) ×1 IMPLANT
GLOVE BIO SURGEON STRL SZ7.5 (GLOVE) ×1 IMPLANT
GLOVE SURG UNDER POLY LF SZ7.5 (GLOVE) ×1 IMPLANT
GOWN STRL REUS W/ TWL XL LVL3 (GOWN DISPOSABLE) ×1 IMPLANT
GOWN STRL REUS W/TWL XL LVL3 (GOWN DISPOSABLE) ×1
GOWN STRL REUS W/TWL XL LVL4 (GOWN DISPOSABLE) ×1 IMPLANT
GUIDE PIN 3.2X343 (PIN) ×1
GUIDE PIN 3.2X343MM (PIN) ×1
GUIDE ROD 3.0 (MISCELLANEOUS) ×1
KIT TURNOVER CYSTO (KITS) ×1 IMPLANT
MANIFOLD NEPTUNE II (INSTRUMENTS) ×1 IMPLANT
MAT ABSORB  FLUID 56X50 GRAY (MISCELLANEOUS) ×1
MAT ABSORB FLUID 56X50 GRAY (MISCELLANEOUS) ×1 IMPLANT
NAIL TIBIAL 11.5X38 (Nail) IMPLANT
NS IRRIG 1000ML POUR BTL (IV SOLUTION) ×1 IMPLANT
PAD ABD DERMACEA PRESS 5X9 (GAUZE/BANDAGES/DRESSINGS) ×1 IMPLANT
PAD ARMBOARD 7.5X6 YLW CONV (MISCELLANEOUS) ×1 IMPLANT
PIN GUIDE 3.2X343MM (PIN) IMPLANT
ROD GUIDE 3.0 (MISCELLANEOUS) IMPLANT
SCREW TRIGEN LOW PROF 5.0X32.5 (Screw) IMPLANT
SCREW TRIGEN LOW PROF 5.0X37.5 (Screw) IMPLANT
SCREW TRIGEN LOW PROF 5.0X40 (Screw) IMPLANT
SCREW TRIGEN LOW PROF 5.0X52.5 (Screw) IMPLANT
SCREW TRIGEN LOW PROF 5.0X55 (Screw) IMPLANT
SET CYSTO W/LG BORE CLAMP LF (SET/KITS/TRAYS/PACK) IMPLANT
SPLINT PLASTER CAST FAST 5X30 (CAST SUPPLIES) IMPLANT
STAPLER SKIN PROX 35W (STAPLE) ×1 IMPLANT
SUCTION FRAZIER HANDLE 10FR (MISCELLANEOUS) ×1
SUCTION TUBE FRAZIER 10FR DISP (MISCELLANEOUS) ×1 IMPLANT
SUT ETHILON 2 0 FS 18 (SUTURE) IMPLANT
SUT MON AB 2-0 CT1 36 (SUTURE) IMPLANT
SUT VIC AB 0 CT1 36 (SUTURE) ×2 IMPLANT
SUT VIC AB 2-0 CT1 (SUTURE) ×1 IMPLANT
SUT VIC AB 2-0 CT1 27 (SUTURE) ×1
SUT VIC AB 2-0 CT1 TAPERPNT 27 (SUTURE) ×1 IMPLANT
TAPE PAPER 1/2X10 TAN MEDIPORE (MISCELLANEOUS) ×1 IMPLANT
TRAP FLUID SMOKE EVACUATOR (MISCELLANEOUS) ×1 IMPLANT
TRAY FOLEY SLVR 16FR LF STAT (SET/KITS/TRAYS/PACK) ×1 IMPLANT
WATER STERILE IRR 500ML POUR (IV SOLUTION) ×1 IMPLANT

## 2022-07-14 NOTE — Assessment & Plan Note (Signed)
Psychiatry consult. Cont home meds once med rec is confirmed.

## 2022-07-14 NOTE — Assessment & Plan Note (Addendum)
Per orthopedic. Pt s/p ORIF. Management  per orthopedic.
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
## 2022-07-14 NOTE — Progress Notes (Signed)
Pt arrived via stretcher from ED pt sleeping arousable to verbal stimuli. Unable to answer questions, unable to sign surgical consent. Pt  lethargic, foley catheter inserted.

## 2022-07-14 NOTE — Assessment & Plan Note (Signed)
Monitor on tele for any dysrhythmia.  counseling and psych consult once alert.

## 2022-07-14 NOTE — Anesthesia Procedure Notes (Signed)
Procedure Name: Intubation Date/Time: 07/14/2022 5:32 PM  Performed by: Nelda Marseille, CRNAPre-anesthesia Checklist: Patient identified, Patient being monitored, Timeout performed, Emergency Drugs available and Suction available Patient Re-evaluated:Patient Re-evaluated prior to induction Oxygen Delivery Method: Circle System Utilized Preoxygenation: Pre-oxygenation with 100% oxygen Induction Type: IV induction Ventilation: Mask ventilation without difficulty Laryngoscope Size: Mac, 3 and McGraph Grade View: Grade I Tube type: Oral Tube size: 7.5 mm Number of attempts: 1 Airway Equipment and Method: Stylet and Oral airway Placement Confirmation: ETT inserted through vocal cords under direct vision, positive ETCO2 and breath sounds checked- equal and bilateral Secured at: 21 cm Tube secured with: Tape Dental Injury: Teeth and Oropharynx as per pre-operative assessment  Difficulty Due To: Difficulty was unanticipated

## 2022-07-14 NOTE — Transfer of Care (Signed)
Immediate Anesthesia Transfer of Care Note  Patient: Dominic [name]  Procedure(s) Performed: INTRAMEDULLARY (IM) NAIL TIBIAL (Right) INTRAMEDULLARY (IM) NAIL FIBULA (Right)  Patient Location: PACU  Anesthesia Type:General  Level of Consciousness: sedated  Airway & Oxygen Therapy: Patient Spontanous Breathing and Patient connected to face mask oxygen  Post-op Assessment: Report given to RN and Post -op Vital signs reviewed and stable  Post vital signs: Reviewed and stable  Last Vitals:  Vitals Value Taken Time  BP    Temp    Pulse    Resp    SpO2      Last Pain:  Vitals:   07/14/22 1630  TempSrc: Temporal  PainSc:          Complications:  Encounter Notable Events  Notable Event Outcome Phase Comment  Difficult to intubate - unexpected  Intraprocedure Filed from anesthesia note documentation.
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
## 2022-07-14 NOTE — ED Notes (Signed)
This Clinical research associate with EDT Jess Barters and MD at bedside assisted MD in applying a splint on patients right tibia. Splint has been completed and patient is pulled up in the bed comfortable and right leg is secured with two pillow in between right leg.
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
## 2022-07-14 NOTE — Assessment & Plan Note (Signed)
-  Nicotine patch 

## 2022-07-14 NOTE — Assessment & Plan Note (Signed)
Stable. Will get pulse oximetry with vitals.  IS once alert to avoid any post operative hypoxia or pna complication.

## 2022-07-14 NOTE — Assessment & Plan Note (Signed)
Stat IV thiamine.  CIWA. Low threshold to start low dose scheduled librium if pt starts to have withdrawal symptoms.  Glucose level q 4 hr for 6 times as pt is unable to take po.  IV dextrose once pt receives thiamine.  Magnesium level added on.

## 2022-07-15 ENCOUNTER — Other Ambulatory Visit: Payer: Self-pay

## 2022-07-15 DIAGNOSIS — S82291B Other fracture of shaft of right tibia, initial encounter for open fracture type I or II: Secondary | ICD-10-CM | POA: Diagnosis not present

## 2022-07-15 DIAGNOSIS — F1410 Cocaine abuse, uncomplicated: Secondary | ICD-10-CM | POA: Diagnosis not present

## 2022-07-15 DIAGNOSIS — F1010 Alcohol abuse, uncomplicated: Secondary | ICD-10-CM

## 2022-07-15 DIAGNOSIS — F203 Undifferentiated schizophrenia: Secondary | ICD-10-CM | POA: Diagnosis not present

## 2022-07-15 DIAGNOSIS — F209 Schizophrenia, unspecified: Secondary | ICD-10-CM

## 2022-07-15 LAB — CBC
HCT: 34.6 % — ABNORMAL LOW (ref 39.0–52.0)
Hemoglobin: 12.5 g/dL — ABNORMAL LOW (ref 13.0–17.0)
MCH: 28.9 pg (ref 26.0–34.0)
MCHC: 36.1 g/dL — ABNORMAL HIGH (ref 30.0–36.0)
MCV: 79.9 fL — ABNORMAL LOW (ref 80.0–100.0)
Platelets: 187 10*3/uL (ref 150–400)
RBC: 4.33 MIL/uL (ref 4.22–5.81)
RDW: 13.7 % (ref 11.5–15.5)
WBC: 9.9 10*3/uL (ref 4.0–10.5)
nRBC: 0 % (ref 0.0–0.2)

## 2022-07-15 LAB — GLUCOSE, RANDOM
Glucose, Bld: 105 mg/dL — ABNORMAL HIGH (ref 70–99)
Glucose, Bld: 107 mg/dL — ABNORMAL HIGH (ref 70–99)
Glucose, Bld: 143 mg/dL — ABNORMAL HIGH (ref 70–99)
Glucose, Bld: 125 mg/dL — ABNORMAL HIGH (ref 70–99)

## 2022-07-15 LAB — BASIC METABOLIC PANEL
Anion gap: 7 (ref 5–15)
BUN: 9 mg/dL (ref 6–20)
CO2: 24 mmol/L (ref 22–32)
Calcium: 8.2 mg/dL — ABNORMAL LOW (ref 8.9–10.3)
Chloride: 104 mmol/L (ref 98–111)
Creatinine, Ser: 0.73 mg/dL (ref 0.61–1.24)
GFR, Estimated: >60 mL/min (ref >60)
Glucose, Bld: 106 mg/dL — ABNORMAL HIGH (ref 70–99)
Potassium: 3.8 mmol/L (ref 3.5–5.1)
Sodium: 135 mmol/L (ref 135–145)

## 2022-07-15 LAB — MAGNESIUM: Magnesium: 1.7 mg/dL (ref 1.7–2.4)

## 2022-07-15 MED ORDER — ASPIRIN 325 MG PO TBEC
325.0000 mg | DELAYED_RELEASE_TABLET | Freq: Two times a day (BID) | ORAL | Status: DC
  Administered 2022-07-15 – 2022-07-16 (×2): 325 mg via ORAL
  Filled 2022-07-15 (×2): qty 1

## 2022-07-15 MED ORDER — CEPHALEXIN 500 MG PO CAPS
500.0000 mg | ORAL_CAPSULE | Freq: Three times a day (TID) | ORAL | Status: AC
Start: 2022-07-15 — End: 2022-07-16
  Administered 2022-07-15 – 2022-07-16 (×3): 500 mg via ORAL
  Filled 2022-07-15 (×3): qty 1

## 2022-07-15 MED ORDER — OLANZAPINE 5 MG PO TBDP
5.0000 mg | ORAL_TABLET | Freq: Every day | ORAL | Status: DC
  Administered 2022-07-16: 5 mg via ORAL
  Filled 2022-07-15: qty 1

## 2022-07-15 MED ORDER — HYDROXYZINE HCL 25 MG PO TABS: 25.0000 mg | ORAL_TABLET | Freq: Four times a day (QID) | ORAL | Status: DC | PRN
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
## 2022-07-15 NOTE — Progress Notes (Signed)
Tele sitter order was d/c. Patient has been calm and cooperative throughout the shift. He is sleeping between care, but easily aroused. PRN pain meds not given d/t sleeping most of the shift.  A&O x2-3. Disoriented to the day, but knows it is September 2023. He think he is at [facility]. Up to the chair today with 1 assist for a little bit. Ate breakfast and lunch. Great appetite. Had a BM today. Voided post foley removal.
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
## 2022-07-15 NOTE — Progress Notes (Signed)
OT Cancellation Note  Patient Details Name: Dominic [name] MRN: [id number] DOB: 1976/09/10   Cancelled Treatment:    Reason Eval/Treat Not Completed: Fatigue/lethargy limiting ability to participate. OT attempting to see patient for evaluation. Pt sleeping upon arrival and easily woke to voice, however, unable to maintain alertness in order to answer questions or participate in therapy. Will re-attempt at later date/time.  Dorene Grebe  [facility] 07/15/2022, 3:44 PM
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
## 2022-07-15 NOTE — Plan of Care (Signed)
  Problem: Activity: Goal: Ability to ambulate and perform ADLs will improve Outcome: Not Progressing   Problem: Pain Management: Goal: Pain level will decrease Outcome: Not Progressing   Problem: Education: Goal: Knowledge of General Education information will improve Description: Including pain rating scale, medication(s)/side effects and non-pharmacologic comfort measures Outcome: Not Progressing   Problem: Clinical Measurements: Goal: Will remain free from infection Outcome: Progressing   Problem: Activity: Goal: Risk for activity intolerance will decrease Outcome: Not Progressing   Problem: Nutrition: Goal: Adequate nutrition will be maintained Outcome: Not Progressing Note: Patient intermittently woke up during the night. Patient is alert to self, disoriented to situation, time, and place. Patient NPO because he is not alert enough to perform a swallow screen.    Problem: Coping: Goal: Level of anxiety will decrease Outcome: Not Progressing   Problem: Elimination: Goal: Will not experience complications related to bowel motility Outcome: Progressing Goal: Will not experience complications related to urinary retention Outcome: Not Applicable Note: Foley removed at 0650 today (07/15/22).    Problem: Elimination: Goal: Will not experience complications related to urinary retention Outcome: Not Applicable Note: Foley removed at 0650 today (07/15/22).    Problem: Skin Integrity: Goal: Risk for impaired skin integrity will decrease Outcome: Progressing   Problem: Pain Managment: Goal: General experience of comfort will improve Outcome: Not Progressing

## 2022-07-15 NOTE — Anesthesia Postprocedure Evaluation (Signed)
Anesthesia Post Note  Patient: Alejos Reinhardt  Procedure(s) Performed: INTRAMEDULLARY (IM) NAIL TIBIAL (Right) INTRAMEDULLARY (IM) NAIL FIBULA (Right)  Patient location during evaluation: PACU Anesthesia Type: General Level of consciousness: lethargic, responds to stimulation and confused Pain management: pain level controlled Vital Signs Assessment: post-procedure vital signs reviewed and stable Respiratory status: spontaneous breathing, nonlabored ventilation and respiratory function stable Cardiovascular status: blood pressure returned to baseline and stable Postop Assessment: no apparent nausea or vomiting Anesthetic complications: no Comments: Pt responds to loud verbal stimulation and touch with short one word responses. His vitals and ventilation is stable and appropriate. Will be monitored on the floor.    Encounter Notable Events  Notable Event Outcome Phase Comment  Difficult to intubate - unexpected  Intraprocedure Filed from anesthesia note documentation.     Last Vitals:  Vitals:   07/14/22 2248 07/15/22 0349  BP: (!) 144/87 (!) 151/95  Pulse: 84 66  Resp: 20 16  Temp: 36.5 C 36.6 C  SpO2: 98% 99%    Last Pain:  Vitals:   07/15/22 0703  TempSrc:   PainSc: Asleep                 Foye Deer

## 2022-07-16 DIAGNOSIS — S82291B Other fracture of shaft of right tibia, initial encounter for open fracture type I or II: Secondary | ICD-10-CM | POA: Diagnosis not present

## 2022-07-16 LAB — CBC
HCT: 32.9 % — ABNORMAL LOW (ref 39.0–52.0)
Hemoglobin: 11.9 g/dL — ABNORMAL LOW (ref 13.0–17.0)
MCH: 28.9 pg (ref 26.0–34.0)
MCHC: 36.2 g/dL — ABNORMAL HIGH (ref 30.0–36.0)
MCV: 79.9 fL — ABNORMAL LOW (ref 80.0–100.0)
Platelets: 179 10*3/uL (ref 150–400)
RBC: 4.12 MIL/uL — ABNORMAL LOW (ref 4.22–5.81)
RDW: 14 % (ref 11.5–15.5)
WBC: 9.7 10*3/uL (ref 4.0–10.5)
nRBC: 0 % (ref 0.0–0.2)

## 2022-07-16 LAB — FERRITIN: Ferritin: 196 ng/mL (ref 24–336)

## 2022-07-16 LAB — BASIC METABOLIC PANEL
Anion gap: 6 (ref 5–15)
BUN: 12 mg/dL (ref 6–20)
CO2: 24 mmol/L (ref 22–32)
Calcium: 8.2 mg/dL — ABNORMAL LOW (ref 8.9–10.3)
Chloride: 103 mmol/L (ref 98–111)
Creatinine, Ser: 0.66 mg/dL (ref 0.61–1.24)
GFR, Estimated: >60 mL/min (ref >60)
Glucose, Bld: 104 mg/dL — ABNORMAL HIGH (ref 70–99)
Potassium: 3.6 mmol/L (ref 3.5–5.1)
Sodium: 133 mmol/L — ABNORMAL LOW (ref 135–145)

## 2022-07-16 LAB — IRON AND TIBC
Iron: 18 ug/dL — ABNORMAL LOW (ref 45–182)
Saturation Ratios: 7 % — ABNORMAL LOW (ref 17.9–39.5)
TIBC: 253 ug/dL (ref 250–450)
UIBC: 235 ug/dL

## 2022-07-16 MED ORDER — OXYCODONE HCL 5 MG PO TABS: 5.0000 mg | ORAL_TABLET | ORAL | 0 refills | Status: DC | PRN

## 2022-07-16 MED ORDER — LORAZEPAM 1 MG PO TABS: 1.0000 mg | ORAL_TABLET | Freq: Once | ORAL | Status: DC

## 2022-07-16 MED ORDER — CEPHALEXIN 500 MG PO CAPS
500.0000 mg | ORAL_CAPSULE | Freq: Three times a day (TID) | ORAL | Status: DC
Start: 2022-07-16 — End: 2022-07-16

## 2022-07-16 MED ORDER — LORAZEPAM 0.5 MG PO TABS: 0.5000 mg | ORAL_TABLET | Freq: Three times a day (TID) | ORAL | Status: DC | PRN

## 2022-07-16 MED ORDER — OLANZAPINE 5 MG PO TABS: 10.0000 mg | ORAL_TABLET | Freq: Every day | ORAL | Status: DC

## 2022-07-16 MED ORDER — OXYCODONE HCL 5 MG PO TABS: 5.0000 mg | ORAL_TABLET | Freq: Once | ORAL | Status: DC

## 2022-07-16 MED ORDER — HYDROXYZINE HCL 50 MG PO TABS: 50.0000 mg | ORAL_TABLET | Freq: Three times a day (TID) | ORAL | Status: DC | PRN
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
## 2022-07-16 NOTE — Plan of Care (Signed)
  Problem: Education: Goal: Verbalization of understanding the information provided (i.e., activity precautions, restrictions, etc) will improve Outcome: Progressing Goal: Individualized Educational Video(s) Outcome: Progressing   Problem: Activity: Goal: Ability to ambulate and perform ADLs will improve Outcome: Progressing   Problem: Clinical Measurements: Goal: Postoperative complications will be avoided or minimized Outcome: Progressing   Problem: Self-Concept: Goal: Ability to maintain and perform role responsibilities to the fullest extent possible will improve Outcome: Progressing   Problem: Pain Management: Goal: Pain level will decrease Outcome: Progressing   Problem: Education: Goal: Knowledge of General Education information will improve Description Including pain rating scale, medication(s)/side effects and non-pharmacologic comfort measures Outcome: Progressing   Problem: Health Behavior/Discharge Planning: Goal: Ability to manage health-related needs will improve Outcome: Progressing   Problem: Clinical Measurements: Goal: Ability to maintain clinical measurements within normal limits will improve Outcome: Progressing Goal: Will remain free from infection Outcome: Progressing Goal: Diagnostic test results will improve Outcome: Progressing Goal: Respiratory complications will improve Outcome: Progressing Goal: Cardiovascular complication will be avoided Outcome: Progressing   Problem: Activity: Goal: Risk for activity intolerance will decrease Outcome: Progressing   Problem: Nutrition: Goal: Adequate nutrition will be maintained Outcome: Progressing   Problem: Coping: Goal: Level of anxiety will decrease Outcome: Progressing   Problem: Elimination: Goal: Will not experience complications related to bowel motility Outcome: Progressing   Problem: Pain Managment: Goal: General experience of comfort will improve Outcome: Progressing   Problem:  Safety: Goal: Ability to remain free from injury will improve Outcome: Progressing   Problem: Skin Integrity: Goal: Risk for impaired skin integrity will decrease Outcome: Progressing   
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
## 2022-07-16 NOTE — Progress Notes (Signed)
Pt is stating that "he is ready to go, and wants to leave", MD made aware, per MD she will be here soon.

## 2022-07-16 NOTE — Progress Notes (Signed)
OT Cancellation Note  Patient Details Name: Dominic [name] MRN: [id number] DOB: Nov 11, 1976   Cancelled Treatment:    Reason Eval/Treat Not Completed: Other (comment). OT attempting to see pt for evaluation. Security present in room and RN reporting that pt has been extremely agitated this morning and trying to leave the hospital. Will re-attempt at later time/date.  Dorene Grebe  [facility] 07/16/2022, 9:42 AM

## 2022-07-16 NOTE — Progress Notes (Addendum)
Progress Note  Patient left hospital Against Medical Advice.  I was unable to speak to him prior to his departure from the hospital.  The risks of leaving the hospital were discussed with the patient by multiple staff members.  He voiced understanding.  He was evaluated by Psychiatry on 9/2 and myself on 9/3 morning and has capacity to make medical decisions.    Patient: Dominic [name] TDD:[id number] DOB: 1976-07-01 DOA: 07/14/2022     2 DOS: the patient was seen and examined on 07/16/2022   Brief hospital course: Dominic [name] is a 46 y.o. male with PMH of schizophrenia, nicotine dependence, cocaine abuse, alcohol use who was admitted for a right tibial fracture s/p ORIF.  Discharge is pending SNF placement.  Assessment and Plan:  Right Tibial-Fibula Fracture s/p ORIF: - Ortho deems patient stable for discharge. - We are awaiting SNF placement.  Schizophrenia, chronic condition (HCC) On 9/2, patient was evaluated by psychiatry and was deemed stable and able to make his own medical decisions. - Continue Zyprexa 5 mg in am and 10 mg in pm. - Give Ativan PRN for anxiety or agitation.   Nicotine dependence, cigarettes, uncomplicated Nicotine patch.    Asthma Stable.    Cocaine abuse (HCC) - Counseled on substance cessation.   Alcohol abuse with withdrawal - Patient has not required anything on CIWA x 3 days.  - Counseled on alcohol cessation.   Microcytic anemia Iron panel, ferritin   DVT prophylaxis:  SCDs   Code Status:  Full code   Family Communication:  Hennick,christina (Mother)  [phone] (Home Phone)    Disposition Plan:  SNF   Consults called:  Orthopedics: Dr. Okey Dupre   Admission status: Inpatient   Unit: MedSurg unit.      Subjective: NAOE. Reports pain to right leg, improving. No other complaints.  Physical Exam: Vitals:   07/15/22 1540 07/15/22 2323 07/16/22 0820 07/16/22 1049  BP: 136/88 128/77 118/73 133/74  Pulse: 82 71 63 81   Resp:  18 18   Temp: 98.2 F (36.8 C) 98.6 F (37 C) 98 F (36.7 C)   TempSrc:      SpO2: 100% 99% 100% 99%  Weight:      Height:       Physical Exam Vitals and nursing note reviewed.  Constitutional:      General: He is not in acute distress.    Appearance: He is not diaphoretic.  HENT:     Head: Atraumatic.  Eyes:     Pupils: Pupils are equal, round, and reactive to light.  Cardiovascular:     Rate and Rhythm: Normal rate and regular rhythm.     Pulses: Normal pulses.     Heart sounds: No murmur heard. Pulmonary:     Effort: Pulmonary effort is normal. No respiratory distress.     Breath sounds: Normal breath sounds. No rales.  Abdominal:     General: Abdomen is flat. There is no distension.     Palpations: Abdomen is soft.     Tenderness: There is no abdominal tenderness. There is no rebound.  Musculoskeletal:     Left lower leg: No edema.     Comments: RLE wrapped in bandages, toe sensation intact, WWP  Skin:    General: Skin is warm and dry.     Capillary Refill: Capillary refill takes less than 2 seconds.  Neurological:     Mental Status: He is alert and oriented to person, place, and time. Mental status is  at baseline.  Psychiatric:        Mood and Affect: Mood normal.     Data Reviewed:     Latest Ref Rng & Units 07/16/2022    7:05 AM 07/15/2022    2:38 AM 07/14/2022   10:26 AM  CBC  WBC 4.0 - 10.5 K/uL 9.7  9.9  16.2   Hemoglobin 13.0 - 17.0 g/dL 16.1  09.6  04.5   Hematocrit 39.0 - 52.0 % 32.9  34.6  38.5   Platelets 150 - 400 K/uL 179  187  240       Latest Ref Rng & Units 07/16/2022    7:05 AM 07/15/2022    5:48 PM 07/15/2022   12:35 PM  BMP  Glucose 70 - 99 mg/dL 409  811  914   BUN 6 - 20 mg/dL 12     Creatinine 7.82 - 1.24 mg/dL 9.56     Sodium 213 - 086 mmol/L 133     Potassium 3.5 - 5.1 mmol/L 3.6     Chloride 98 - 111 mmol/L 103     CO2 22 - 32 mmol/L 24     Calcium 8.9 - 10.3 mg/dL 8.2      Ethanol 578 UDS: +cocaine, benzo Salicylate <  7.0  CT body: No acute intracranial process.   Interval healing of previously described left C7 transverse process fracture. No new acute fractures identified.  1. No evidence of acute traumatic injury in the chest, abdomen, or pelvis. 2. Patchy opacities in the left lower lobe are likely infectious or inflammatory etiology with differential including aspiration, stable to slightly improved since 06/14/2022  No acute fracture or traumatic malalignment of the thoracic or lumbar spine.  Right tib/fib: 1. Acute, displaced fracture of the mid height of the tibial diaphysis. 2. Comminuted, multipartite acute fracture of the proximal fibular diaphysis with mild anterior apex angulation.  Family Communication: None at bedside  Disposition: Status is: Inpatient Remains inpatient appropriate because: placement s/p orif  Planned Discharge Destination: Skilled nursing facility    Time spent: 35 minutes  Author: Baldwin [place], MD 07/16/2022 6:49 PM  For on call review [email or web address].
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
## 2022-07-18 ENCOUNTER — Encounter: Payer: Self-pay | Admitting: Orthopaedic Surgery

## 2022-07-20 ENCOUNTER — Encounter: Payer: Self-pay | Admitting: Emergency Medicine

## 2022-07-20 ENCOUNTER — Emergency Department: Payer: Medicaid Other

## 2022-07-20 ENCOUNTER — Other Ambulatory Visit: Payer: Self-pay

## 2022-07-20 ENCOUNTER — Inpatient Hospital Stay
Admission: EM | Admit: 2022-07-20 | Discharge: 2022-07-22 | DRG: 602 | Disposition: A | Discharge status: Home / Self Care | Payer: Medicaid Other | Attending: Internal Medicine | Admitting: Internal Medicine

## 2022-07-20 DIAGNOSIS — E871 Hypo-osmolality and hyponatremia: Secondary | ICD-10-CM | POA: Diagnosis present

## 2022-07-20 DIAGNOSIS — F251 Schizoaffective disorder, depressive type: Secondary | ICD-10-CM | POA: Diagnosis present

## 2022-07-20 DIAGNOSIS — L03115 Cellulitis of right lower limb: Principal | ICD-10-CM | POA: Diagnosis present

## 2022-07-20 DIAGNOSIS — R600 Localized edema: Secondary | ICD-10-CM

## 2022-07-20 DIAGNOSIS — F1090 Alcohol use, unspecified, uncomplicated: Secondary | ICD-10-CM

## 2022-07-20 DIAGNOSIS — F4323 Adjustment disorder with mixed anxiety and depressed mood: Secondary | ICD-10-CM | POA: Diagnosis present

## 2022-07-20 DIAGNOSIS — G4730 Sleep apnea, unspecified: Secondary | ICD-10-CM | POA: Diagnosis present

## 2022-07-20 DIAGNOSIS — Z789 Other specified health status: Secondary | ICD-10-CM

## 2022-07-20 DIAGNOSIS — S82201B Unspecified fracture of shaft of right tibia, initial encounter for open fracture type I or II: Secondary | ICD-10-CM | POA: Diagnosis present

## 2022-07-20 DIAGNOSIS — T84622A Infection and inflammatory reaction due to internal fixation device of right tibia, initial encounter: Secondary | ICD-10-CM

## 2022-07-20 DIAGNOSIS — W19XXXA Unspecified fall, initial encounter: Secondary | ICD-10-CM

## 2022-07-20 DIAGNOSIS — W010XXD Fall on same level from slipping, tripping and stumbling without subsequent striking against object, subsequent encounter: Secondary | ICD-10-CM | POA: Diagnosis present

## 2022-07-20 DIAGNOSIS — Z4789 Encounter for other orthopedic aftercare: Secondary | ICD-10-CM

## 2022-07-20 DIAGNOSIS — F1910 Other psychoactive substance abuse, uncomplicated: Secondary | ICD-10-CM | POA: Diagnosis present

## 2022-07-20 DIAGNOSIS — M79604 Pain in right leg: Secondary | ICD-10-CM | POA: Diagnosis not present

## 2022-07-20 DIAGNOSIS — Z8249 Family history of ischemic heart disease and other diseases of the circulatory system: Secondary | ICD-10-CM

## 2022-07-20 DIAGNOSIS — Z8616 Personal history of COVID-19: Secondary | ICD-10-CM

## 2022-07-20 DIAGNOSIS — Z818 Family history of other mental and behavioral disorders: Secondary | ICD-10-CM

## 2022-07-20 DIAGNOSIS — F1010 Alcohol abuse, uncomplicated: Secondary | ICD-10-CM | POA: Diagnosis present

## 2022-07-20 DIAGNOSIS — S82201D Unspecified fracture of shaft of right tibia, subsequent encounter for closed fracture with routine healing: Secondary | ICD-10-CM

## 2022-07-20 DIAGNOSIS — F1410 Cocaine abuse, uncomplicated: Secondary | ICD-10-CM | POA: Diagnosis present

## 2022-07-20 DIAGNOSIS — F17210 Nicotine dependence, cigarettes, uncomplicated: Secondary | ICD-10-CM | POA: Diagnosis present

## 2022-07-20 MED ORDER — ACETAMINOPHEN 10 MG/ML IV SOLN
1000.0000 mg | Freq: Once | INTRAVENOUS | Status: AC
  Filled 2022-07-20: qty 100

## 2022-07-20 MED ORDER — CEFAZOLIN SODIUM-DEXTROSE 2-4 GM/100ML-% IV SOLN
2.0000 g | Freq: Once | INTRAVENOUS | Status: AC
Start: 2022-07-20 — End: 2022-07-21
  Administered 2022-07-20: 2 g via INTRAVENOUS
  Filled 2022-07-20: qty 100

## 2022-07-20 MED ORDER — MORPHINE SULFATE (PF) 4 MG/ML IV SOLN
4.0000 mg | Freq: Once | INTRAVENOUS | Status: AC
  Administered 2022-07-20: 4 mg via INTRAVENOUS
  Filled 2022-07-20: qty 1

## 2022-07-20 NOTE — Assessment & Plan Note (Addendum)
Continue seroquel Patient has been lost to follow up and is off many of his meds. is open to being seen by behavioral health while here

## 2022-07-20 NOTE — Assessment & Plan Note (Addendum)
Alcohol withdrawal prevention protocol
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
## 2022-07-20 NOTE — Assessment & Plan Note (Signed)
Unable to care for self Ambulatory dysfunction secondary to splint and pain PT evaluation

## 2022-07-20 NOTE — Assessment & Plan Note (Signed)
Monitor for withdrawal.  Patient used cocaine after the day of arrival

## 2022-07-20 NOTE — Assessment & Plan Note (Addendum)
Inflammation versus infection right lower leg s/p fixation, irrigation and debridement of an open tibia fracture on 9/1 Pain right lower extremity s/p fall Continue Ancef recommended by orthopedics.  Signed out AMA on 9/1 without prophylactic Keflex Old splint replaced in the ED Pain control Orthopedic consult Fall precautions
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
## 2022-07-21 ENCOUNTER — Other Ambulatory Visit: Payer: Self-pay

## 2022-07-21 DIAGNOSIS — L03115 Cellulitis of right lower limb: Secondary | ICD-10-CM | POA: Diagnosis present

## 2022-07-21 DIAGNOSIS — F1410 Cocaine abuse, uncomplicated: Secondary | ICD-10-CM | POA: Diagnosis present

## 2022-07-21 DIAGNOSIS — M79604 Pain in right leg: Secondary | ICD-10-CM | POA: Diagnosis present

## 2022-07-21 DIAGNOSIS — S82201B Unspecified fracture of shaft of right tibia, initial encounter for open fracture type I or II: Secondary | ICD-10-CM | POA: Diagnosis present

## 2022-07-21 DIAGNOSIS — E871 Hypo-osmolality and hyponatremia: Secondary | ICD-10-CM | POA: Diagnosis present

## 2022-07-21 DIAGNOSIS — W19XXXA Unspecified fall, initial encounter: Secondary | ICD-10-CM | POA: Diagnosis not present

## 2022-07-21 DIAGNOSIS — F1010 Alcohol abuse, uncomplicated: Secondary | ICD-10-CM | POA: Diagnosis present

## 2022-07-21 DIAGNOSIS — F251 Schizoaffective disorder, depressive type: Secondary | ICD-10-CM

## 2022-07-21 DIAGNOSIS — R600 Localized edema: Secondary | ICD-10-CM | POA: Diagnosis not present

## 2022-07-21 DIAGNOSIS — F1090 Alcohol use, unspecified, uncomplicated: Secondary | ICD-10-CM | POA: Diagnosis not present

## 2022-07-21 DIAGNOSIS — W010XXD Fall on same level from slipping, tripping and stumbling without subsequent striking against object, subsequent encounter: Secondary | ICD-10-CM | POA: Diagnosis present

## 2022-07-21 DIAGNOSIS — Z818 Family history of other mental and behavioral disorders: Secondary | ICD-10-CM | POA: Diagnosis not present

## 2022-07-21 DIAGNOSIS — F1910 Other psychoactive substance abuse, uncomplicated: Secondary | ICD-10-CM | POA: Diagnosis not present

## 2022-07-21 DIAGNOSIS — F4323 Adjustment disorder with mixed anxiety and depressed mood: Secondary | ICD-10-CM | POA: Diagnosis present

## 2022-07-21 DIAGNOSIS — S82201D Unspecified fracture of shaft of right tibia, subsequent encounter for closed fracture with routine healing: Secondary | ICD-10-CM | POA: Diagnosis not present

## 2022-07-21 DIAGNOSIS — Z8616 Personal history of COVID-19: Secondary | ICD-10-CM | POA: Diagnosis not present

## 2022-07-21 DIAGNOSIS — G4730 Sleep apnea, unspecified: Secondary | ICD-10-CM | POA: Diagnosis present

## 2022-07-21 DIAGNOSIS — Z8249 Family history of ischemic heart disease and other diseases of the circulatory system: Secondary | ICD-10-CM | POA: Diagnosis not present

## 2022-07-21 DIAGNOSIS — F17210 Nicotine dependence, cigarettes, uncomplicated: Secondary | ICD-10-CM | POA: Diagnosis present

## 2022-07-21 LAB — BASIC METABOLIC PANEL
Anion gap: 11 (ref 5–15)
BUN: 14 mg/dL (ref 6–20)
CO2: 24 mmol/L (ref 22–32)
Calcium: 8.8 mg/dL — ABNORMAL LOW (ref 8.9–10.3)
Chloride: 100 mmol/L (ref 98–111)
Creatinine, Ser: 0.79 mg/dL (ref 0.61–1.24)
GFR, Estimated: >60 mL/min (ref >60)
Glucose, Bld: 198 mg/dL — ABNORMAL HIGH (ref 70–99)
Potassium: 3.6 mmol/L (ref 3.5–5.1)
Sodium: 135 mmol/L (ref 135–145)

## 2022-07-21 LAB — URINE DRUG SCREEN, QUALITATIVE (ARMC ONLY)
Amphetamines, Ur Screen: NOT DETECTED
Barbiturates, Ur Screen: NOT DETECTED
Benzodiazepine, Ur Scrn: NOT DETECTED
Cannabinoid 50 Ng, Ur ~~LOC~~: POSITIVE — AB
Cocaine Metabolite,Ur ~~LOC~~: POSITIVE — AB
MDMA (Ecstasy)Ur Screen: NOT DETECTED
Methadone Scn, Ur: NOT DETECTED
Opiate, Ur Screen: POSITIVE — AB
Phencyclidine (PCP) Ur S: NOT DETECTED
Tricyclic, Ur Screen: NOT DETECTED

## 2022-07-21 LAB — CBC WITH DIFFERENTIAL/PLATELET
Abs Immature Granulocytes: 0.03 10*3/uL (ref 0.00–0.07)
Basophils Absolute: 0 10*3/uL (ref 0.0–0.1)
Basophils Relative: 0 %
Eosinophils Absolute: 0.1 10*3/uL (ref 0.0–0.5)
Eosinophils Relative: 1 %
HCT: 32.5 % — ABNORMAL LOW (ref 39.0–52.0)
Hemoglobin: 11.5 g/dL — ABNORMAL LOW (ref 13.0–17.0)
Immature Granulocytes: 0 %
Lymphocytes Relative: 26 %
Lymphs Abs: 2.9 10*3/uL (ref 0.7–4.0)
MCH: 28.7 pg (ref 26.0–34.0)
MCHC: 35.4 g/dL (ref 30.0–36.0)
MCV: 81 fL (ref 80.0–100.0)
Monocytes Absolute: 0.8 10*3/uL (ref 0.1–1.0)
Monocytes Relative: 7 %
Neutro Abs: 7.3 10*3/uL (ref 1.7–7.7)
Neutrophils Relative %: 66 %
Platelets: 304 10*3/uL (ref 150–400)
RBC: 4.01 MIL/uL — ABNORMAL LOW (ref 4.22–5.81)
RDW: 13.7 % (ref 11.5–15.5)
WBC: 11.1 10*3/uL — ABNORMAL HIGH (ref 4.0–10.5)
nRBC: 0 % (ref 0.0–0.2)

## 2022-07-21 LAB — HIV ANTIBODY (ROUTINE TESTING W REFLEX): HIV Screen 4th Generation wRfx: NONREACTIVE

## 2022-07-21 LAB — SEDIMENTATION RATE: Sed Rate: 64 mm/hr — ABNORMAL HIGH (ref 0–15)

## 2022-07-21 LAB — C-REACTIVE PROTEIN: CRP: 4.9 mg/dL — ABNORMAL HIGH (ref <1.0)

## 2022-07-21 MED ORDER — QUETIAPINE FUMARATE 25 MG PO TABS
50.0000 mg | ORAL_TABLET | Freq: Two times a day (BID) | ORAL | Status: DC
  Administered 2022-07-21 – 2022-07-22 (×4): 50 mg via ORAL
  Filled 2022-07-21 (×4): qty 2

## 2022-07-21 MED ORDER — ONDANSETRON HCL 4 MG/2ML IJ SOLN: 4.0000 mg | Freq: Four times a day (QID) | INTRAMUSCULAR | Status: DC | PRN

## 2022-07-21 MED ORDER — ADULT MULTIVITAMIN W/MINERALS CH
1.0000 | ORAL_TABLET | Freq: Every day | ORAL | Status: DC
  Administered 2022-07-21 – 2022-07-22 (×2): 1 via ORAL
  Filled 2022-07-21 (×2): qty 1

## 2022-07-21 MED ORDER — MORPHINE SULFATE (PF) 2 MG/ML IV SOLN
2.0000 mg | INTRAVENOUS | Status: DC | PRN
  Administered 2022-07-21 – 2022-07-22 (×4): 2 mg via INTRAVENOUS
  Filled 2022-07-21 (×4): qty 1

## 2022-07-21 MED ORDER — ONDANSETRON HCL 4 MG PO TABS: 4.0000 mg | ORAL_TABLET | Freq: Four times a day (QID) | ORAL | Status: DC | PRN

## 2022-07-21 MED ORDER — ENOXAPARIN SODIUM 60 MG/0.6ML IJ SOSY
0.5000 mg/kg | PREFILLED_SYRINGE | INTRAMUSCULAR | Status: DC
  Administered 2022-07-21 – 2022-07-22 (×2): 50 mg via SUBCUTANEOUS
  Filled 2022-07-21 (×2): qty 0.6

## 2022-07-21 MED ORDER — THIAMINE HCL 100 MG/ML IJ SOLN: 100.0000 mg | Freq: Every day | INTRAMUSCULAR | Status: DC

## 2022-07-21 MED ORDER — THIAMINE MONONITRATE 100 MG PO TABS
100.0000 mg | ORAL_TABLET | Freq: Every day | ORAL | Status: DC
  Administered 2022-07-21 – 2022-07-22 (×2): 100 mg via ORAL
  Filled 2022-07-21 (×2): qty 1

## 2022-07-21 MED ORDER — OLANZAPINE 10 MG PO TBDP
10.0000 mg | ORAL_TABLET | Freq: Every day | ORAL | Status: DC
  Administered 2022-07-21 – 2022-07-22 (×2): 10 mg via ORAL
  Filled 2022-07-21 (×2): qty 1

## 2022-07-21 MED ORDER — ACETAMINOPHEN 325 MG PO TABS: 650.0000 mg | ORAL_TABLET | Freq: Four times a day (QID) | ORAL | Status: DC | PRN

## 2022-07-21 MED ORDER — CEFAZOLIN SODIUM-DEXTROSE 1-4 GM/50ML-% IV SOLN
1.0000 g | Freq: Three times a day (TID) | INTRAVENOUS | Status: DC
  Administered 2022-07-21 – 2022-07-22 (×4): 1 g via INTRAVENOUS
  Filled 2022-07-21 (×5): qty 50

## 2022-07-21 MED ORDER — LORAZEPAM 2 MG/ML IJ SOLN: 1.0000 mg | INTRAMUSCULAR | Status: DC | PRN

## 2022-07-21 MED ORDER — LORAZEPAM 1 MG PO TABS: 1.0000 mg | ORAL_TABLET | ORAL | Status: DC | PRN

## 2022-07-21 MED ORDER — FOLIC ACID 1 MG PO TABS
1.0000 mg | ORAL_TABLET | Freq: Every day | ORAL | Status: DC
  Administered 2022-07-21 – 2022-07-22 (×2): 1 mg via ORAL
  Filled 2022-07-21 (×2): qty 1

## 2022-07-21 MED ORDER — HYDROCODONE-ACETAMINOPHEN 5-325 MG PO TABS
1.0000 | ORAL_TABLET | ORAL | Status: DC | PRN
  Administered 2022-07-21 – 2022-07-22 (×5): 2 via ORAL
  Filled 2022-07-21 (×5): qty 2

## 2022-07-21 MED ORDER — ACETAMINOPHEN 650 MG RE SUPP: 650.0000 mg | Freq: Four times a day (QID) | RECTAL | Status: DC | PRN

## 2022-07-21 NOTE — TOC Progression Note (Signed)
Transition of Care [facility]) - Progression Note    Patient Details  Name: Dominic [name] MRN: [id number] Date of Birth: [birth year]  Transition of Care [facility]) CM/SW Contact  Marlowe Sax, RN Phone Number: 07/21/2022, 10:31 AM  Clinical Narrative:     The patient has had numerous visits in the last 6 months to the ED for physiatric reasons and for Poly substance abuse, he has had 3 previous admissions prior to this one in the last 6 months and left AMA at the last one on 9/3, he fell at home while getting out of the shower and comes into the hospital due to being unable to care for himself,  TOC to follow and assist with DC planning PT to eval The patient has been provided Substance Abuse resources and it was added to the AVS for DC instructions        Expected Discharge Plan and Services                                                 Social Determinants of Health (SDOH) Interventions    Readmission Risk Interventions     No data to display

## 2022-07-21 NOTE — Hospital Course (Signed)
Dominic [name] is a 46 y.o. male with medical history significant for Schizoaffective disorder, polysubstance abuse including cocaine, THC and amphetamines, alcohol use disorder, who is s/p fixation, irrigation and debridement of an open tibia fracture on 9/1, but left AMA before ready to discharge. Patient came back to the hospital complaining not able to take care of himself, could not get the pain medicine.

## 2022-07-21 NOTE — Plan of Care (Signed)

## 2022-07-21 NOTE — Discharge Instructions (Signed)
                  Intensive Outpatient Programs  [facility]    [facility] [address]     [address] [place],  [place]     [place], [place] [phone]      [phone]  [facility]   [facility]  (Inpatient and outpatient)  [phone] (Suboxone and Methadone) [address]           [phone]           [facility]    [facility] - Intensive Outpatient [address]     [address] [place], [place] [address]     [place], [place]  [phone]      [phone]  [facility] (Outpatient, Inpatient, [facility]  [facility] (Groups and Residental) (insurance only) [phone]    [place], [place]          [phone]       [facility]    [facility] (for caregivers and family) [address]     [address] [place], [place]     [place], [place] [phone]      [phone]  Residential Treatment Programs  [facility]  [facility](2 years) Residential: 90 days)  [facility] [address]      [address] [place], [place]     [place], [place] [phone]      [phone] or [phone]  [facility]    [facility] [address]      [address] [place], [place]     [place], [place] [phone]      [phone]  [facility]   [facility] ([facility] [address]     [address] [place], [place] [address]     [place], [place] [phone]      [phone] Admissions: 8am-3pm M-F  [facility]: Residential Program (90 Days)              [facility]: [facility]  [place], [place]     [place], [place]  [phone] or [phone]    (Walk in Hours over the weekend or by referral)   [facility]: [facility]:[phone] (for crisis  response 24 hours a day) 

## 2022-07-21 NOTE — ED Notes (Signed)
Pt is A&OX4. Pt is coming with a splint on right leg and lower right extremity cellulitis after open fx fixation on 07/14/22

## 2022-07-21 NOTE — Progress Notes (Signed)
PHARMACIST - PHYSICIAN COMMUNICATION  CONCERNING:  Enoxaparin (Lovenox) for DVT Prophylaxis    RECOMMENDATION: Patient was prescribed enoxaprin 40mg  q24 hours for VTE prophylaxis.   Filed Weights   07/20/22 2117  Weight: 99.8 kg (220 lb)    Body mass index is 29.84 kg/m.  Estimated Creatinine Clearance: 142.7 mL/min (by C-G formula based on SCr of 0.66 mg/dL).   Based on [facility] policy patient is candidate for enoxaparin 0.5mg /kg TBW SQ every 24 hours based on BMI being >30.  DESCRIPTION: Pharmacy has adjusted enoxaparin dose per [facility] policy.  Patient is now receiving enoxaparin 0.5 mg/kg every 24 hours   [facility], PharmD, [facility] 07/21/2022 12:25 AM

## 2022-07-21 NOTE — Progress Notes (Signed)
     Subjective:      Patient reports pain as {severity:14795:o}.  {ORTHOADMISSIONSTATUS:21269}   Objective:   VITALS:   Vitals:   07/21/22 0145 07/21/22 0828  BP: 113/77 126/84  Pulse: 71 65  Resp: 18 18  Temp: 98.5 F (36.9 C) 97.8 F (36.6 C)  SpO2: 98% 95%    {physical exam:3041401}  LABS Recent Labs    07/21/22 0014  HGB 11.5*  HCT 32.5*  WBC 11.1*  PLT 304    Recent Labs    07/21/22 0014  NA 135  K 3.6  BUN 14  CREATININE 0.79  GLUCOSE 198*     Assessment/Plan:      {ZOXW:9604540}  - Appreciate hospitalist team management - PT/OT: 50% partial weightbearing of right lower extremity - Remove splint and apply CAM walker - 50% WB - Antibiotic per hospitalist    Lanney Gins PA-C  [facility]

## 2022-07-21 NOTE — Progress Notes (Signed)
  Progress Note   Patient: Dominic [name] IDP:[id number] DOB: [birth year] DOA: 07/20/2022     0 DOS: the patient was seen and examined on 07/21/2022   Brief hospital course: Humzah Giomar Gusler is a 46 y.o. male with medical history significant for Schizoaffective disorder, polysubstance abuse including cocaine, THC and amphetamines, alcohol use disorder, who is s/p fixation, irrigation and debridement of an open tibia fracture on 9/1, but left AMA before ready to discharge. Patient came back to the hospital complaining not able to take care of himself, could not get the pain medicine.  Assessment and Plan:  Fall at home, initial encounter Unable to care for self Ambulatory dysfunction secondary to splint and pain Patient will be seen by PT, but he refused any nursing home placement.  Edema of right lower extremity right lower leg s/p fixation, irrigation and debridement of an open tibia fracture on 9/1 Pain right lower extremity  s/p fall Patient has some right lower extremity edema, no evidence of infection.  Patient was given IV cefazolin after discussion with orthopedics.  I will wait for formal consult from orthopedics, he probably can go home tomorrow with orthopedic follow-up in the office.  Alcohol use disorder Continue CIWA protocol, no evidence of withdrawal.  Monitor potassium and magnesium.  Polysubstance abuse (HCC) Advised to quit.  Schizoaffective disorder, depressive type (HCC) Continue home medicines, follow-up with psychiatry as outpatient.      Subjective:  Patient still complaining of pain in the right leg, no fever or chills.  No nausea vomiting or diarrhea.  Physical Exam: Vitals:   07/20/22 2117 07/21/22 0027 07/21/22 0145 07/21/22 0828  BP:  130/74 113/77 126/84  Pulse:  78 71 65  Resp:  16 18 18   Temp:  99 F (37.2 C) 98.5 F (36.9 C) 97.8 F (36.6 C)  TempSrc:  Oral Oral Oral  SpO2:  96% 98% 95%  Weight: 99.8 kg     Height: 6' (1.829 m)       General exam: Appears calm and comfortable  Respiratory system: Clear to auscultation. Respiratory effort normal. Cardiovascular system: S1 & S2 heard, RRR. No JVD, murmurs, rubs, gallops or clicks. . Gastrointestinal system: Abdomen is nondistended, soft and nontender. No organomegaly or masses felt. Normal bowel sounds heard. Central nervous system: Alert and oriented. No focal neurological deficits. Extremities: Right leg suture in place, mild edema, no significant tenderness. Skin: No rashes, lesions or ulcers Psychiatry: Judgement and insight appear normal. Mood & affect appropriate.   Data Reviewed:  Results reviewed.  Family Communication:   Disposition: Status is: Inpatient Remains inpatient appropriate because: Severity of disease, on IV antibiotics.  Planned Discharge Destination: Home    Time spent: 35 minutes  Author: 06-17-1985, MD 07/21/2022 1:38 PM  For on call review [email or web address].

## 2022-07-22 DIAGNOSIS — F1090 Alcohol use, unspecified, uncomplicated: Secondary | ICD-10-CM | POA: Diagnosis not present

## 2022-07-22 DIAGNOSIS — M79604 Pain in right leg: Secondary | ICD-10-CM | POA: Diagnosis not present

## 2022-07-22 DIAGNOSIS — W19XXXA Unspecified fall, initial encounter: Secondary | ICD-10-CM

## 2022-07-22 DIAGNOSIS — Y92009 Unspecified place in unspecified non-institutional (private) residence as the place of occurrence of the external cause: Secondary | ICD-10-CM

## 2022-07-22 DIAGNOSIS — F251 Schizoaffective disorder, depressive type: Secondary | ICD-10-CM | POA: Diagnosis not present

## 2022-07-22 MED ORDER — OXYCODONE HCL 5 MG PO TABS: 5.0000 mg | ORAL_TABLET | Freq: Four times a day (QID) | ORAL | 0 refills | Status: DC | PRN

## 2022-07-22 MED ORDER — CEPHALEXIN 500 MG PO CAPS: 500.0000 mg | ORAL_CAPSULE | Freq: Four times a day (QID) | ORAL | 0 refills | Status: AC

## 2022-07-22 NOTE — TOC Transition Note (Addendum)
Transition of Care [facility]) - CM/SW Discharge Note   Patient Details  Name: Dominic [name] MRN: [id number] Date of Birth: [birth year]  [facility] CM/SW Contact:  Liliana Cline, LCSW Phone Number: 07/22/2022, 10:40 AM   Clinical Narrative:    Patient to DC home today. Notified by PT that patient needs crutches. Checked with Jasmine with [employer] who stated patient got crutches through insurance in Oct 2021 so would need to private pay $35 for cructhes. CSW was then informed by PT that a RW is more appropriate. RW ordered to be delivered to bedside prior to DC today.  Patient states he will DC home where he lives alone. Patient states his sister or brother will pick him up.  SA resources were added to AVS by [facility] per notes.  Patient denies additional needs prior to DC.   10:43- Call from [facility] with [employer] stating insurance will not cover RW either since patient got a walking aide (crutches) within last 5 years. Patient told RN his ride is on the way and he does not have money for a RW or want to wait for one to be delivered. DME order cancelled.   11:55- Patient in [facility], now states he has no ride or money and needs a taxi voucher.  Voucher provided to patient in Medical Mall.  [place] Adas contacted and said they will be here in about 45 minutes.  Copy of voucher faxed to [facility] and also emailed to [facility] Asst Inetta Fermo.   Final next level of care: Home/Self Care Barriers to Discharge: Barriers Resolved   Patient Goals and CMS Choice Patient states their goals for this hospitalization and ongoing recovery are:: to return home CMS Medicare.gov Compare Post Acute Care list provided to:: Patient Choice offered to / list presented to : Patient  Discharge Placement                Patient to be transferred to facility by: sister or brother Name of family member notified: patient notified Patient and family notified of of transfer: 07/22/22  Discharge Plan and  Services                DME Arranged: Dan Humphreys rolling DME Agency: [employer] Date DME Agency Contacted: 07/22/22   Representative spoke with at DME Agency: Leavy Cella            Social Determinants of Health (SDOH) Interventions     Readmission Risk Interventions     No data to display

## 2022-07-22 NOTE — Progress Notes (Signed)
PT Cancellation Note  Patient Details Name: Dominic [name] MRN: [id number] DOB: 1976-07-26   Cancelled Treatment: Pt refused to see PT, and appeared visibly upset. He states that he does not want to go to SNF and that he would like to return home. He also reports not having any DME. PT educated pt on using partial weight bearing on forward wheeled walker to maintain his surgical precautions. PT communicated DME needs to case manager and placed order for forward wheeled rolling walker.       Ellin Goodie PT, DPT  07/22/2022, 10:28 AM

## 2022-07-22 NOTE — Progress Notes (Signed)
Patient offered taxi voucher. Patient declined and said he had a ride coming. Patient demanded he be brought down to the medical mall to wait for his ride. RN stated that it is best to wait in the room until his ride gets here. Patient refused. NA took patient to medical mall to wait for his ride. Patient was discharged.

## 2022-07-22 NOTE — Plan of Care (Signed)
  Problem: Clinical Measurements: Goal: Cardiovascular complication will be avoided Outcome: Progressing   Problem: Activity: Goal: Risk for activity intolerance will decrease Outcome: Progressing   Problem: Nutrition: Goal: Adequate nutrition will be maintained Outcome: Completed/Met   Problem: Coping: Goal: Level of anxiety will decrease Outcome: Progressing   Problem: Elimination: Goal: Will not experience complications related to urinary retention Outcome: Progressing   Problem: Pain Managment: Goal: General experience of comfort will improve Outcome: Progressing   Problem: Safety: Goal: Ability to remain free from injury will improve Outcome: Progressing   Problem: Skin Integrity: Goal: Risk for impaired skin integrity will decrease Outcome: Progressing
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
## 2022-07-27 ENCOUNTER — Emergency Department
Admission: EM | Admit: 2022-07-27 | Discharge: 2022-07-27 | Disposition: A | Discharge status: Home / Self Care | Payer: Medicaid Other | Attending: Emergency Medicine | Admitting: Emergency Medicine

## 2022-07-27 ENCOUNTER — Emergency Department: Payer: Medicaid Other

## 2022-07-27 DIAGNOSIS — F1290 Cannabis use, unspecified, uncomplicated: Secondary | ICD-10-CM | POA: Diagnosis not present

## 2022-07-27 DIAGNOSIS — F1410 Cocaine abuse, uncomplicated: Secondary | ICD-10-CM | POA: Diagnosis not present

## 2022-07-27 DIAGNOSIS — S82832A Other fracture of upper and lower end of left fibula, initial encounter for closed fracture: Secondary | ICD-10-CM | POA: Diagnosis not present

## 2022-07-27 DIAGNOSIS — F203 Undifferentiated schizophrenia: Secondary | ICD-10-CM | POA: Diagnosis not present

## 2022-07-27 DIAGNOSIS — Z8616 Personal history of COVID-19: Secondary | ICD-10-CM | POA: Diagnosis not present

## 2022-07-27 DIAGNOSIS — X58XXXA Exposure to other specified factors, initial encounter: Secondary | ICD-10-CM | POA: Diagnosis not present

## 2022-07-27 DIAGNOSIS — F209 Schizophrenia, unspecified: Secondary | ICD-10-CM | POA: Diagnosis not present

## 2022-07-27 DIAGNOSIS — F1010 Alcohol abuse, uncomplicated: Secondary | ICD-10-CM | POA: Diagnosis present

## 2022-07-27 DIAGNOSIS — F3132 Bipolar disorder, current episode depressed, moderate: Secondary | ICD-10-CM | POA: Diagnosis not present

## 2022-07-27 DIAGNOSIS — T8130XA Disruption of wound, unspecified, initial encounter: Secondary | ICD-10-CM | POA: Insufficient documentation

## 2022-07-27 DIAGNOSIS — F1420 Cocaine dependence, uncomplicated: Secondary | ICD-10-CM | POA: Diagnosis not present

## 2022-07-27 DIAGNOSIS — F1490 Cocaine use, unspecified, uncomplicated: Secondary | ICD-10-CM

## 2022-07-27 DIAGNOSIS — R45851 Suicidal ideations: Secondary | ICD-10-CM | POA: Insufficient documentation

## 2022-07-27 DIAGNOSIS — Z765 Malingerer [conscious simulation]: Secondary | ICD-10-CM

## 2022-07-27 DIAGNOSIS — M79604 Pain in right leg: Secondary | ICD-10-CM | POA: Insufficient documentation

## 2022-07-27 DIAGNOSIS — M79605 Pain in left leg: Secondary | ICD-10-CM

## 2022-07-27 DIAGNOSIS — S80922A Unspecified superficial injury of left lower leg, initial encounter: Secondary | ICD-10-CM | POA: Diagnosis present

## 2022-07-27 LAB — SALICYLATE LEVEL: Salicylate Lvl: <7 mg/dL — ABNORMAL LOW (ref 7.0–30.0)

## 2022-07-27 LAB — COMPREHENSIVE METABOLIC PANEL
ALT: 26 U/L (ref 0–44)
AST: 38 U/L (ref 15–41)
Albumin: 3.4 g/dL — ABNORMAL LOW (ref 3.5–5.0)
Alkaline Phosphatase: 49 U/L (ref 38–126)
Anion gap: 6 (ref 5–15)
BUN: 24 mg/dL — ABNORMAL HIGH (ref 6–20)
CO2: 23 mmol/L (ref 22–32)
Calcium: 8.7 mg/dL — ABNORMAL LOW (ref 8.9–10.3)
Chloride: 109 mmol/L (ref 98–111)
Creatinine, Ser: 0.85 mg/dL (ref 0.61–1.24)
GFR, Estimated: >60 mL/min (ref >60)
Glucose, Bld: 105 mg/dL — ABNORMAL HIGH (ref 70–99)
Potassium: 3.7 mmol/L (ref 3.5–5.1)
Sodium: 138 mmol/L (ref 135–145)
Total Bilirubin: 0.8 mg/dL (ref 0.3–1.2)
Total Protein: 7.7 g/dL (ref 6.5–8.1)

## 2022-07-27 LAB — CBC
HCT: 30.3 % — ABNORMAL LOW (ref 39.0–52.0)
Hemoglobin: 10.5 g/dL — ABNORMAL LOW (ref 13.0–17.0)
MCH: 28.2 pg (ref 26.0–34.0)
MCHC: 34.7 g/dL (ref 30.0–36.0)
MCV: 81.2 fL (ref 80.0–100.0)
Platelets: 365 10*3/uL (ref 150–400)
RBC: 3.73 MIL/uL — ABNORMAL LOW (ref 4.22–5.81)
RDW: 13.9 % (ref 11.5–15.5)
WBC: 9.8 10*3/uL (ref 4.0–10.5)
nRBC: 0 % (ref 0.0–0.2)

## 2022-07-27 LAB — URINE DRUG SCREEN, QUALITATIVE (ARMC ONLY)
Amphetamines, Ur Screen: NOT DETECTED
Barbiturates, Ur Screen: NOT DETECTED
Benzodiazepine, Ur Scrn: NOT DETECTED
Cannabinoid 50 Ng, Ur ~~LOC~~: POSITIVE — AB
Cocaine Metabolite,Ur ~~LOC~~: POSITIVE — AB
MDMA (Ecstasy)Ur Screen: NOT DETECTED
Methadone Scn, Ur: NOT DETECTED
Opiate, Ur Screen: NOT DETECTED
Phencyclidine (PCP) Ur S: NOT DETECTED
Tricyclic, Ur Screen: NOT DETECTED

## 2022-07-27 LAB — ETHANOL: Alcohol, Ethyl (B): <10 mg/dL (ref <10)

## 2022-07-27 LAB — ACETAMINOPHEN LEVEL: Acetaminophen (Tylenol), Serum: <10 ug/mL — ABNORMAL LOW (ref 10–30)

## 2022-07-27 MED ORDER — OXYCODONE HCL 5 MG PO TABS: 5.0000 mg | ORAL_TABLET | Freq: Four times a day (QID) | ORAL | 0 refills | Status: DC | PRN

## 2022-07-27 MED ORDER — ACETAMINOPHEN 325 MG PO TABS
650.0000 mg | ORAL_TABLET | Freq: Once | ORAL | Status: AC
  Administered 2022-07-27: 650 mg via ORAL
  Filled 2022-07-27: qty 2

## 2022-07-27 MED ORDER — OXYCODONE HCL 5 MG PO TABS
5.0000 mg | ORAL_TABLET | Freq: Once | ORAL | Status: AC
  Administered 2022-07-27: 5 mg via ORAL
  Filled 2022-07-27: qty 1

## 2022-07-27 NOTE — ED Notes (Signed)
Primary RN at bedside talking with pt.
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
## 2022-07-27 NOTE — ED Notes (Signed)
Pt dressed out into burgundy scrubs. belongings placed in belongings bag  Black shirt Red shorts Wallace Cullens sandals
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
## 2022-07-27 NOTE — Consult Note (Signed)
[facility] Psych ED Progress Note  07/27/2022 10:25 AM Dominic [name]  MRN:  [id number]   Method of visit?: Face to Face   Subjective:  "I feel great."  On re-assessment, patient is alert and oriented x 4. Makes good eye contact. Is lucid.  Patient denies suicidal ideation, homicidal ideation, paranoia, auditory or visual hallucinations. He states that he is going to follow up with Lorella Nimrod at [facility]; he was supposed to do that last time he was here, but says he got his leg hurt, so he couldn't. Patient states that he is "good" as far as psychiatrically, but he wants a prescription for his pain in his leg. EDP advised by Clinical research associate.   Patient does not meet criteria for inpatient psychiatric hospitalization.   Principal Problem: Cocaine abuse (HCC) Diagnosis:  Principal Problem:   Cocaine abuse (HCC) Active Problems:   Schizophrenia, undifferentiated (HCC)   Alcohol abuse   Malingering   Cocaine use disorder, moderate, dependence (HCC)  Total Time spent with patient: 15 minutes  Past Psychiatric History:   Past Medical History:  Past Medical History:  Diagnosis Date   Alcohol intoxication, uncomplicated (HCC) 01/13/2019   Anxiety    Asthma    Bipolar 1 disorder (HCC)    Chronic hepatitis C without hepatic coma (HCC) 05/28/2018   Depression    Hepatitis C    Hepatitis C antibody positive in blood 05/28/2018   Malingering 03/30/2022   Psychiatry note from [facility] states "directly admitted to fabricating SI and CAH to obtain [psychiatric] admission"   Schizophrenia (HCC)    Sleep apnea     Past Surgical History:  Procedure Laterality Date   CYST REMOVAL NECK     neck   INTRAMEDULLARY (IM) NAIL FIBULA Right 07/14/2022   Procedure: INTRAMEDULLARY (IM) NAIL FIBULA;  Surgeon: Ross Marcus, MD;  Location: [facility] ORS;  Service: Orthopedics;  Laterality: Right;   TIBIA IM NAIL INSERTION Right 07/14/2022   Procedure: INTRAMEDULLARY (IM) NAIL TIBIAL;  Surgeon: Ross Marcus, MD;  Location: [facility]  ORS;  Service: Orthopedics;  Laterality: Right;   Family History:  Family History  Problem Relation Age of Onset   Hypertension Sister    Schizophrenia Sister    Family Psychiatric  History:  Social History:  Social History   Substance and Sexual Activity  Alcohol Use Yes   Alcohol/week: 3.0 standard drinks of alcohol   Types: 3 Shots of liquor per week     Social History   Substance and Sexual Activity  Drug Use Yes   Types: Cocaine, Marijuana, Methamphetamines    Social History   Socioeconomic History   Marital status: Single    Spouse name: Not on file   Number of children: Not on file   Years of education: Not on file   Highest education level: Not on file  Occupational History   Not on file  Tobacco Use   Smoking status: Some Days    Packs/day: 1.00    Types: Cigarettes   Smokeless tobacco: Never  Vaping Use   Vaping Use: Never used  Substance and Sexual Activity   Alcohol use: Yes    Alcohol/week: 3.0 standard drinks of alcohol    Types: 3 Shots of liquor per week   Drug use: Yes    Types: Cocaine, Marijuana, Methamphetamines   Sexual activity: Not Currently  Other Topics Concern   Not on file  Social History Narrative   Not on file   Social Determinants of Health   Financial Resource  Strain: Low Risk  (11/25/2018)   Overall Financial Resource Strain (CARDIA)    Difficulty of Paying Living Expenses: Not very hard  Food Insecurity: No Food Insecurity (07/21/2022)   Hunger Vital Sign    Worried About Running Out of Food in the Last Year: Never true    Ran Out of Food in the Last Year: Never true  Transportation Needs: No Transportation Needs (07/21/2022)   PRAPARE - Administrator, Civil Service (Medical): No    Lack of Transportation (Non-Medical): No  Physical Activity: Inactive (11/25/2018)   Exercise Vital Sign    Days of Exercise per Week: 0 days    Minutes of Exercise per Session: 0 min  Stress: No Stress Concern Present (11/25/2018)    [employer] of Occupational Health - Occupational Stress Questionnaire    Feeling of Stress : Only a little  Social Connections: Unknown (11/25/2018)   Social Connection and Isolation Panel [NHANES]    Frequency of Communication with Friends and Family: Twice a week    Frequency of Social Gatherings with Friends and Family: Twice a week    Attends Religious Services: Never    Database administrator or Organizations: No    Attends Engineer, structural: Never    Marital Status: Patient refused    Sleep: Good  Appetite:  Good  Current Medications: No current facility-administered medications for this encounter.   Current Outpatient Medications  Medication Sig Dispense Refill   cephALEXin (KEFLEX) 500 MG capsule Take 1 capsule (500 mg total) by mouth 4 (four) times daily for 7 days. 28 capsule 0   oxyCODONE (OXY IR/ROXICODONE) 5 MG immediate release tablet Take 1 tablet (5 mg total) by mouth every 6 (six) hours as needed for moderate pain (pain score 4-6). 12 tablet 0    Lab Results:  Results for orders placed or performed during the hospital encounter of 07/27/22 (from the past 48 hour(s))  CBC     Status: Abnormal   Collection Time: 07/27/22  4:15 AM  Result Value Ref Range   WBC 9.8 4.0 - 10.5 K/uL   RBC 3.73 (L) 4.22 - 5.81 MIL/uL   Hemoglobin 10.5 (L) 13.0 - 17.0 g/dL   HCT 40.9 (L) 81.1 - 91.4 %   MCV 81.2 80.0 - 100.0 fL   MCH 28.2 26.0 - 34.0 pg   MCHC 34.7 30.0 - 36.0 g/dL   RDW 78.2 95.6 - 21.3 %   Platelets 365 150 - 400 K/uL   nRBC 0.0 0.0 - 0.2 %    Comment: Performed at [facility], [address] [place], [place] [address]  Comprehensive metabolic panel     Status: Abnormal   Collection Time: 07/27/22  4:15 AM  Result Value Ref Range   Sodium 138 135 - 145 mmol/L   Potassium 3.7 3.5 - 5.1 mmol/L   Chloride 109 98 - 111 mmol/L   CO2 23 22 - 32 mmol/L   Glucose, Bld 105 (H) 70 - 99 mg/dL    Comment: Glucose reference range applies  only to samples taken after fasting for at least 8 hours.   BUN 24 (H) 6 - 20 mg/dL   Creatinine, Ser 8.46 0.61 - 1.24 mg/dL   Calcium 8.7 (L) 8.9 - 10.3 mg/dL   Total Protein 7.7 6.5 - 8.1 g/dL   Albumin 3.4 (L) 3.5 - 5.0 g/dL   AST 38 15 - 41 U/L   ALT 26 0 - 44 U/L  Alkaline Phosphatase 49 38 - 126 U/L   Total Bilirubin 0.8 0.3 - 1.2 mg/dL   GFR, Estimated >62 >13 mL/min    Comment: (NOTE) Calculated using the CKD-EPI Creatinine Equation (2021)    Anion gap 6 5 - 15    Comment: Performed at [facility], [address] [place], [place] [address]  Ethanol     Status: None   Collection Time: 07/27/22  4:15 AM  Result Value Ref Range   Alcohol, Ethyl (B) <10 <10 mg/dL    Comment: (NOTE) Lowest detectable limit for serum alcohol is 10 mg/dL.  For medical purposes only. Performed at [facility], [address] [place], [place] [address]   Acetaminophen level     Status: Abnormal   Collection Time: 07/27/22  4:15 AM  Result Value Ref Range   Acetaminophen (Tylenol), Serum <10 (L) 10 - 30 ug/mL    Comment: (NOTE) Therapeutic concentrations vary significantly. A range of 10-30 ug/mL  may be an effective concentration for many patients. However, some  are best treated at concentrations outside of this range. Acetaminophen concentrations >150 ug/mL at 4 hours after ingestion  and >50 ug/mL at 12 hours after ingestion are often associated with  toxic reactions.  Performed at [facility], [address] [place], [place] [address]   Salicylate level     Status: Abnormal   Collection Time: 07/27/22  4:15 AM  Result Value Ref Range   Salicylate Lvl <7.0 (L) 7.0 - 30.0 mg/dL    Comment: Performed at [facility], [address] [place], [place] [address]  Urine Drug Screen, Qualitative     Status: Abnormal   Collection Time: 07/27/22  5:13 AM  Result Value Ref Range   Tricyclic, Ur Screen NONE DETECTED NONE DETECTED   Amphetamines,  Ur Screen NONE DETECTED NONE DETECTED   MDMA (Ecstasy)Ur Screen NONE DETECTED NONE DETECTED   Cocaine Metabolite,Ur [place] POSITIVE (A) NONE DETECTED   Opiate, Ur Screen NONE DETECTED NONE DETECTED   Phencyclidine (PCP) Ur S NONE DETECTED NONE DETECTED   Cannabinoid 50 Ng, Ur [place] POSITIVE (A) NONE DETECTED   Barbiturates, Ur Screen NONE DETECTED NONE DETECTED   Benzodiazepine, Ur Scrn NONE DETECTED NONE DETECTED   Methadone Scn, Ur NONE DETECTED NONE DETECTED    Comment: (NOTE) Tricyclics + metabolites, urine    Cutoff 1000 ng/mL Amphetamines + metabolites, urine  Cutoff 1000 ng/mL MDMA (Ecstasy), urine              Cutoff 500 ng/mL Cocaine Metabolite, urine          Cutoff 300 ng/mL Opiate + metabolites, urine        Cutoff 300 ng/mL Phencyclidine (PCP), urine         Cutoff 25 ng/mL Cannabinoid, urine                 Cutoff 50 ng/mL Barbiturates + metabolites, urine  Cutoff 200 ng/mL Benzodiazepine, urine              Cutoff 200 ng/mL Methadone, urine                   Cutoff 300 ng/mL  The urine drug screen provides only a preliminary, unconfirmed analytical test result and should not be used for non-medical purposes. Clinical consideration and professional judgment should be applied to any positive drug screen result due to possible interfering substances. A more specific alternate chemical method must be used in order  to obtain a confirmed analytical result. Gas chromatography / mass spectrometry (GC/MS) is the preferred confirm atory method. Performed at [facility], [address] [place], [place] [address]     Blood Alcohol level:  Lab Results  Component Value Date   [facility] <10 07/27/2022   ETH 155 (H) 07/14/2022    Physical Findings: AIMS:  , ,  ,  ,    CIWA:    COWS:     Musculoskeletal: Strength & Muscle Tone: within normal limits Gait & Station: normal Patient leans: N/A  Psychiatric Specialty Exam:  Presentation  General Appearance: Appropriate  for Environment  Eye Contact:Good  Speech:Clear and Coherent  Speech Volume:Normal  Handedness:Right   Mood and Affect  Mood:Euthymic  Affect:Congruent   Thought Process  Thought Processes:Coherent  Descriptions of Associations:Intact  Orientation:Full (Time, Place and Person)  Thought Content:WDL  History of Schizophrenia/Schizoaffective disorder:Yes  Duration of Psychotic Symptoms:Greater than six months  Hallucinations:Hallucinations: None  Ideas of Reference:None  Suicidal Thoughts:Suicidal Thoughts: No  Homicidal Thoughts:Homicidal Thoughts: No   Sensorium  Memory:Immediate Good  Judgment:Fair  Insight:Poor   Executive Functions  Concentration:Fair  Attention Span:Fair  Recall:Fair  Fund of Knowledge:Fair  Language:Fair   Psychomotor Activity  Psychomotor Activity:Psychomotor Activity: Normal   Assets  Assets:Communication Skills; Financial Resources/Insurance; Housing; Resilience; Physical Health; Social Support   Sleep  Sleep:Sleep: Good    Physical Exam: Physical Exam Vitals and nursing note reviewed.  HENT:     Head: Normocephalic.     Nose: No congestion or rhinorrhea.  Eyes:     General:        Right eye: No discharge.        Left eye: No discharge.  Cardiovascular:     Rate and Rhythm: Normal rate.  Pulmonary:     Effort: Pulmonary effort is normal.  Musculoskeletal:        General: Normal range of motion.     Cervical back: Normal range of motion.     Comments: LLE bandaged   Skin:    General: Skin is dry.  Neurological:     Mental Status: He is alert and oriented to person, place, and time.  Psychiatric:        Behavior: Behavior normal.    Review of Systems  HENT: Negative.    Eyes: Negative.   Respiratory: Negative.    Cardiovascular: Negative.   Psychiatric/Behavioral:  Positive for substance abuse. Negative for depression, hallucinations, memory loss and suicidal ideas. The patient is not  nervous/anxious and does not have insomnia.    Blood pressure 118/71, pulse 63, temperature 97.9 F (36.6 C), temperature source Oral, resp. rate 16, SpO2 98 %. There is no height or weight on file to calculate BMI.  Treatment Plan Summary: Plan Patient does not meet criteria for inpatient psychiatric hospitalization.Patient instructed to follow up with Lorella Nimrod at [facility], which he agrees to do.  Reviewed with EDP  Vanetta Mulders, NP 07/27/2022, 10:25 AM

## 2022-07-27 NOTE — ED Notes (Signed)
Pt given meal tray and updated on awaiting reassess from psych
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
## 2022-07-27 NOTE — ED Notes (Signed)
Pt continues yelling out that he is in pain. MD has been made aware.
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
## 2022-07-27 NOTE — ED Notes (Signed)
Pt yelling and walking down hallway using rolling table to stabilize self. This noted by other RN. Pt given his belongings as requested since he decided he is leaving AMA.

## 2022-07-27 NOTE — Discharge Instructions (Addendum)
Follow up with Lorella Nimrod at [facility]:  [phone]  Follow-up with Dr. Okey Dupre with Orthopedics in a week

## 2022-08-12 ENCOUNTER — Other Ambulatory Visit: Payer: Self-pay

## 2022-08-12 ENCOUNTER — Emergency Department: Payer: Medicaid Other

## 2022-08-12 ENCOUNTER — Emergency Department
Admission: EM | Admit: 2022-08-12 | Discharge: 2022-08-13 | Disposition: A | Discharge status: Other Acute Inpatient Hospital | Payer: Medicaid Other | Attending: Student in an Organized Health Care Education/Training Program | Admitting: Student in an Organized Health Care Education/Training Program

## 2022-08-12 DIAGNOSIS — F17210 Nicotine dependence, cigarettes, uncomplicated: Secondary | ICD-10-CM | POA: Insufficient documentation

## 2022-08-12 DIAGNOSIS — F1210 Cannabis abuse, uncomplicated: Secondary | ICD-10-CM | POA: Diagnosis not present

## 2022-08-12 DIAGNOSIS — Z23 Encounter for immunization: Secondary | ICD-10-CM | POA: Diagnosis not present

## 2022-08-12 DIAGNOSIS — S71112A Laceration without foreign body, left thigh, initial encounter: Secondary | ICD-10-CM | POA: Insufficient documentation

## 2022-08-12 DIAGNOSIS — F32A Depression, unspecified: Secondary | ICD-10-CM | POA: Insufficient documentation

## 2022-08-12 DIAGNOSIS — R45851 Suicidal ideations: Secondary | ICD-10-CM | POA: Insufficient documentation

## 2022-08-12 DIAGNOSIS — Y900 Blood alcohol level of less than 20 mg/100 ml: Secondary | ICD-10-CM | POA: Insufficient documentation

## 2022-08-12 DIAGNOSIS — J45909 Unspecified asthma, uncomplicated: Secondary | ICD-10-CM | POA: Diagnosis not present

## 2022-08-12 DIAGNOSIS — F1414 Cocaine abuse with cocaine-induced mood disorder: Secondary | ICD-10-CM | POA: Diagnosis present

## 2022-08-12 DIAGNOSIS — Z20822 Contact with and (suspected) exposure to covid-19: Secondary | ICD-10-CM | POA: Insufficient documentation

## 2022-08-12 DIAGNOSIS — S81812A Laceration without foreign body, left lower leg, initial encounter: Secondary | ICD-10-CM

## 2022-08-12 DIAGNOSIS — F1420 Cocaine dependence, uncomplicated: Secondary | ICD-10-CM | POA: Diagnosis not present

## 2022-08-12 DIAGNOSIS — F209 Schizophrenia, unspecified: Secondary | ICD-10-CM | POA: Diagnosis present

## 2022-08-12 DIAGNOSIS — F1494 Cocaine use, unspecified with cocaine-induced mood disorder: Secondary | ICD-10-CM | POA: Diagnosis present

## 2022-08-12 LAB — URINE DRUG SCREEN, QUALITATIVE (ARMC ONLY)
Amphetamines, Ur Screen: NOT DETECTED
Barbiturates, Ur Screen: NOT DETECTED
Benzodiazepine, Ur Scrn: NOT DETECTED
Cannabinoid 50 Ng, Ur ~~LOC~~: POSITIVE — AB
Cocaine Metabolite,Ur ~~LOC~~: POSITIVE — AB
MDMA (Ecstasy)Ur Screen: NOT DETECTED
Methadone Scn, Ur: NOT DETECTED
Opiate, Ur Screen: NOT DETECTED
Phencyclidine (PCP) Ur S: NOT DETECTED
Tricyclic, Ur Screen: NOT DETECTED

## 2022-08-12 LAB — RESP PANEL BY RT-PCR (FLU A&B, COVID) ARPGX2
Influenza A by PCR: NEGATIVE
Influenza B by PCR: NEGATIVE
SARS Coronavirus 2 by RT PCR: NEGATIVE

## 2022-08-12 LAB — ACETAMINOPHEN LEVEL: Acetaminophen (Tylenol), Serum: <10 ug/mL — ABNORMAL LOW (ref 10–30)

## 2022-08-12 LAB — CBC
HCT: 38.5 % — ABNORMAL LOW (ref 39.0–52.0)
Hemoglobin: 13.3 g/dL (ref 13.0–17.0)
MCH: 28.2 pg (ref 26.0–34.0)
MCHC: 34.5 g/dL (ref 30.0–36.0)
MCV: 81.6 fL (ref 80.0–100.0)
Platelets: 236 10*3/uL (ref 150–400)
RBC: 4.72 MIL/uL (ref 4.22–5.81)
RDW: 14.7 % (ref 11.5–15.5)
WBC: 8.9 10*3/uL (ref 4.0–10.5)
nRBC: 0 % (ref 0.0–0.2)

## 2022-08-12 LAB — COMPREHENSIVE METABOLIC PANEL
ALT: 23 U/L (ref 0–44)
AST: 34 U/L (ref 15–41)
Albumin: 3.9 g/dL (ref 3.5–5.0)
Alkaline Phosphatase: 74 U/L (ref 38–126)
Anion gap: 8 (ref 5–15)
BUN: 18 mg/dL (ref 6–20)
CO2: 25 mmol/L (ref 22–32)
Calcium: 9.1 mg/dL (ref 8.9–10.3)
Chloride: 106 mmol/L (ref 98–111)
Creatinine, Ser: 0.87 mg/dL (ref 0.61–1.24)
GFR, Estimated: >60 mL/min (ref >60)
Glucose, Bld: 104 mg/dL — ABNORMAL HIGH (ref 70–99)
Potassium: 3.9 mmol/L (ref 3.5–5.1)
Sodium: 139 mmol/L (ref 135–145)
Total Bilirubin: 0.7 mg/dL (ref 0.3–1.2)
Total Protein: 8.3 g/dL — ABNORMAL HIGH (ref 6.5–8.1)

## 2022-08-12 LAB — SALICYLATE LEVEL: Salicylate Lvl: <7 mg/dL — ABNORMAL LOW (ref 7.0–30.0)

## 2022-08-12 LAB — ETHANOL: Alcohol, Ethyl (B): <10 mg/dL (ref <10)

## 2022-08-12 MED ORDER — LORAZEPAM 1 MG PO TABS: 1.0000 mg | ORAL_TABLET | Freq: Every day | ORAL | Status: DC

## 2022-08-12 MED ORDER — LOPERAMIDE HCL 2 MG PO CAPS: 2.0000 mg | ORAL_CAPSULE | ORAL | Status: DC | PRN

## 2022-08-12 MED ORDER — LORAZEPAM 1 MG PO TABS: 1.0000 mg | ORAL_TABLET | Freq: Two times a day (BID) | ORAL | Status: DC

## 2022-08-12 MED ORDER — IBUPROFEN 600 MG PO TABS
600.0000 mg | ORAL_TABLET | ORAL | Status: DC | PRN
  Administered 2022-08-12: 600 mg via ORAL
  Filled 2022-08-12: qty 1

## 2022-08-12 MED ORDER — PALIPERIDONE ER 3 MG PO TB24
3.0000 mg | ORAL_TABLET | Freq: Every day | ORAL | Status: DC
  Administered 2022-08-12 – 2022-08-13 (×2): 3 mg via ORAL
  Filled 2022-08-12 (×2): qty 1

## 2022-08-12 MED ORDER — ADULT MULTIVITAMIN W/MINERALS CH
1.0000 | ORAL_TABLET | Freq: Every day | ORAL | Status: DC
  Administered 2022-08-12 – 2022-08-13 (×2): 1 via ORAL
  Filled 2022-08-12 (×2): qty 1

## 2022-08-12 MED ORDER — ONDANSETRON 4 MG PO TBDP: 4.0000 mg | ORAL_TABLET | Freq: Four times a day (QID) | ORAL | Status: DC | PRN

## 2022-08-12 MED ORDER — THIAMINE MONONITRATE 100 MG PO TABS
100.0000 mg | ORAL_TABLET | Freq: Every day | ORAL | Status: DC
  Administered 2022-08-13: 100 mg via ORAL
  Filled 2022-08-12: qty 1

## 2022-08-12 MED ORDER — LORAZEPAM 1 MG PO TABS: 1.0000 mg | ORAL_TABLET | Freq: Three times a day (TID) | ORAL | Status: DC

## 2022-08-12 MED ORDER — HYDROXYZINE HCL 25 MG PO TABS: 25.0000 mg | ORAL_TABLET | Freq: Four times a day (QID) | ORAL | Status: DC | PRN

## 2022-08-12 MED ORDER — TETANUS-DIPHTH-ACELL PERTUSSIS 5-2.5-18.5 LF-MCG/0.5 IM SUSY
0.5000 mL | PREFILLED_SYRINGE | Freq: Once | INTRAMUSCULAR | Status: AC
  Administered 2022-08-12: 0.5 mL via INTRAMUSCULAR
  Filled 2022-08-12: qty 0.5

## 2022-08-12 MED ORDER — THIAMINE HCL 100 MG/ML IJ SOLN: 100.0000 mg | Freq: Once | INTRAMUSCULAR | Status: DC

## 2022-08-12 MED ORDER — THIAMINE MONONITRATE 100 MG PO TABS
100.0000 mg | ORAL_TABLET | Freq: Once | ORAL | Status: AC
  Administered 2022-08-12: 100 mg via ORAL
  Filled 2022-08-12: qty 1

## 2022-08-12 MED ORDER — LORAZEPAM 1 MG PO TABS
1.0000 mg | ORAL_TABLET | Freq: Four times a day (QID) | ORAL | Status: DC
  Administered 2022-08-12 – 2022-08-13 (×3): 1 mg via ORAL
  Filled 2022-08-12 (×3): qty 1

## 2022-08-12 MED ORDER — BACITRACIN ZINC 500 UNIT/GM EX OINT
TOPICAL_OINTMENT | Freq: Once | CUTANEOUS | Status: AC
  Filled 2022-08-12: qty 0.9

## 2022-08-12 MED ORDER — LORAZEPAM 1 MG PO TABS: 1.0000 mg | ORAL_TABLET | Freq: Four times a day (QID) | ORAL | Status: DC | PRN

## 2022-08-12 NOTE — ED Notes (Signed)
Pt dressed out into hospital attire. Pt belongings to include: 1 gray shirt 1 gray pants 2 brown boots  $5 dollar bill [employer] green card 1 purple lighter 1 black underwear

## 2022-08-12 NOTE — ED Provider Notes (Signed)
The patient has been placed in psychiatric observation due to the need to provide a safe environment for the patient while obtaining psychiatric consultation and evaluation, as well as ongoing medical and medication management to treat the patient's condition.     Merlyn Lot, MD 08/12/22 1739
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
## 2022-08-12 NOTE — ED Notes (Signed)
pt recieved snack and drink 
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
## 2022-08-12 NOTE — BH Assessment (Addendum)
Referral information for Detox treatment faxed to;   [facility] ([phone]) ([phone] crisis center/detox facility) (Fax#-[phone]-[phone]) Staff reports not able to accept any intakes until 9am  [facility] ([phone] -or- [phone]), Referral re-faxed to Bryce at 1:15am  [facility] ([phone])  [facility] ([phone]) 
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
## 2022-08-13 NOTE — ED Notes (Signed)
Pt denies SI/HI/AVH, states he is here for detox. Informed he is going to [facility] for that and he is agreeable to the POC.

## 2022-08-13 NOTE — ED Notes (Signed)
VOL/pt accepted to [facility] on 08/13/22 after 9am

## 2022-08-13 NOTE — ED Provider Notes (Signed)
Emergency Medicine Observation Re-evaluation Note  Dominic [name] is a 46 y.o. male, seen on rounds today.  Pt initially presented to the ED for complaints of Laceration and SI Currently, the patient is resting, voices no medical complaints.  Physical Exam  BP 102/80 (BP Location: Left Arm)   Pulse (!) 55   Temp 98.3 F (36.8 C)   Resp 17   SpO2 98%  Physical Exam General: Resting in no acute distress Cardiac: No cyanosis Lungs: Equal rise and fall Psych: Not agitated  ED Course / MDM  EKG:   I have reviewed the labs performed to date as well as medications administered while in observation.  Recent changes in the last 24 hours include no events overnight.  Plan  Current plan is for transfer to [facility] later this morning.    Paulette Blanch, MD 08/13/22 [phone]

## 2022-08-13 NOTE — ED Notes (Signed)
Report given to [facility]

## 2022-08-13 NOTE — BH Assessment (Addendum)
PATIENT BED AVAILABLE AFTER 9AM ON 08/13/22  Patient has been accepted to [facility].  Patient assigned to [facility] Accepting physician is Dr. Elaina Hoops.  Call report to [phone].  Representative was [employer].   ER Staff is aware of it:  [facility] ER Secretary  Dr. Beather Arbour, ER MD  [facility] Patient's Nurse

## 2022-08-13 NOTE — ED Notes (Signed)
Hospital meal provided, pt tolerated w/o complaints.  Waste discarded appropriately.  

## 2022-08-13 NOTE — ED Notes (Signed)
Pt going to [facility] with safe transport. Informed of admission and he verbalized understanding and agreement of same. Given all his personal belongings. Stable, ambulating with w/c and in NAD.

## 2022-08-13 NOTE — ED Notes (Signed)
Pt resting well with no signs of acute distress.

## 2022-08-13 NOTE — ED Notes (Signed)
Called Cory at [facility] to transport to [facility]

## 2022-08-13 NOTE — ED Notes (Signed)
Attempted to call in report and was informed nurse was unavailable. Left number for a call back at this time.

## 2022-09-04 ENCOUNTER — Emergency Department
Admission: EM | Admit: 2022-09-04 | Discharge: 2022-09-05 | Disposition: A | Discharge status: Home / Self Care | Payer: No Typology Code available for payment source | Attending: Emergency Medicine | Admitting: Emergency Medicine

## 2022-09-04 DIAGNOSIS — R45851 Suicidal ideations: Secondary | ICD-10-CM | POA: Insufficient documentation

## 2022-09-04 DIAGNOSIS — F4323 Adjustment disorder with mixed anxiety and depressed mood: Secondary | ICD-10-CM | POA: Insufficient documentation

## 2022-09-04 DIAGNOSIS — F1410 Cocaine abuse, uncomplicated: Secondary | ICD-10-CM | POA: Diagnosis present

## 2022-09-04 DIAGNOSIS — F209 Schizophrenia, unspecified: Secondary | ICD-10-CM | POA: Diagnosis present

## 2022-09-04 DIAGNOSIS — F1420 Cocaine dependence, uncomplicated: Secondary | ICD-10-CM | POA: Insufficient documentation

## 2022-09-04 DIAGNOSIS — F200 Paranoid schizophrenia: Secondary | ICD-10-CM | POA: Diagnosis present

## 2022-09-04 DIAGNOSIS — F32A Depression, unspecified: Secondary | ICD-10-CM | POA: Diagnosis present

## 2022-09-04 DIAGNOSIS — Y900 Blood alcohol level of less than 20 mg/100 ml: Secondary | ICD-10-CM | POA: Insufficient documentation

## 2022-09-04 DIAGNOSIS — F203 Undifferentiated schizophrenia: Secondary | ICD-10-CM | POA: Insufficient documentation

## 2022-09-04 DIAGNOSIS — B182 Chronic viral hepatitis C: Secondary | ICD-10-CM | POA: Diagnosis not present

## 2022-09-04 DIAGNOSIS — F1290 Cannabis use, unspecified, uncomplicated: Secondary | ICD-10-CM | POA: Insufficient documentation

## 2022-09-04 DIAGNOSIS — Z765 Malingerer [conscious simulation]: Secondary | ICD-10-CM | POA: Insufficient documentation

## 2022-09-04 DIAGNOSIS — F1020 Alcohol dependence, uncomplicated: Secondary | ICD-10-CM | POA: Diagnosis not present

## 2022-09-04 DIAGNOSIS — J45909 Unspecified asthma, uncomplicated: Secondary | ICD-10-CM | POA: Diagnosis not present

## 2022-09-04 DIAGNOSIS — Z20822 Contact with and (suspected) exposure to covid-19: Secondary | ICD-10-CM | POA: Insufficient documentation

## 2022-09-04 DIAGNOSIS — F411 Generalized anxiety disorder: Secondary | ICD-10-CM | POA: Diagnosis present

## 2022-09-04 DIAGNOSIS — F1910 Other psychoactive substance abuse, uncomplicated: Secondary | ICD-10-CM | POA: Diagnosis present

## 2022-09-04 DIAGNOSIS — F17210 Nicotine dependence, cigarettes, uncomplicated: Secondary | ICD-10-CM | POA: Insufficient documentation

## 2022-09-04 DIAGNOSIS — F1010 Alcohol abuse, uncomplicated: Secondary | ICD-10-CM | POA: Diagnosis present

## 2022-09-04 DIAGNOSIS — F1090 Alcohol use, unspecified, uncomplicated: Secondary | ICD-10-CM | POA: Diagnosis present

## 2022-09-04 LAB — URINE DRUG SCREEN, QUALITATIVE (ARMC ONLY)
Amphetamines, Ur Screen: NOT DETECTED
Barbiturates, Ur Screen: NOT DETECTED
Benzodiazepine, Ur Scrn: NOT DETECTED
Cannabinoid 50 Ng, Ur ~~LOC~~: POSITIVE — AB
Cocaine Metabolite,Ur ~~LOC~~: POSITIVE — AB
MDMA (Ecstasy)Ur Screen: NOT DETECTED
Methadone Scn, Ur: NOT DETECTED
Opiate, Ur Screen: NOT DETECTED
Phencyclidine (PCP) Ur S: NOT DETECTED
Tricyclic, Ur Screen: NOT DETECTED

## 2022-09-04 LAB — CBC
HCT: 36.9 % — ABNORMAL LOW (ref 39.0–52.0)
Hemoglobin: 13.1 g/dL (ref 13.0–17.0)
MCH: 28.5 pg (ref 26.0–34.0)
MCHC: 35.5 g/dL (ref 30.0–36.0)
MCV: 80.2 fL (ref 80.0–100.0)
Platelets: 259 10*3/uL (ref 150–400)
RBC: 4.6 MIL/uL (ref 4.22–5.81)
RDW: 14.8 % (ref 11.5–15.5)
WBC: 8.4 10*3/uL (ref 4.0–10.5)
nRBC: 0 % (ref 0.0–0.2)

## 2022-09-04 LAB — COMPREHENSIVE METABOLIC PANEL
ALT: 23 U/L (ref 0–44)
AST: 31 U/L (ref 15–41)
Albumin: 4.1 g/dL (ref 3.5–5.0)
Alkaline Phosphatase: 81 U/L (ref 38–126)
Anion gap: 7 (ref 5–15)
BUN: 10 mg/dL (ref 6–20)
CO2: 26 mmol/L (ref 22–32)
Calcium: 9.4 mg/dL (ref 8.9–10.3)
Chloride: 105 mmol/L (ref 98–111)
Creatinine, Ser: 0.73 mg/dL (ref 0.61–1.24)
GFR, Estimated: >60 mL/min (ref >60)
Glucose, Bld: 102 mg/dL — ABNORMAL HIGH (ref 70–99)
Potassium: 3.7 mmol/L (ref 3.5–5.1)
Sodium: 138 mmol/L (ref 135–145)
Total Bilirubin: 0.9 mg/dL (ref 0.3–1.2)
Total Protein: 8.3 g/dL — ABNORMAL HIGH (ref 6.5–8.1)

## 2022-09-04 LAB — SARS CORONAVIRUS 2 BY RT PCR: SARS Coronavirus 2 by RT PCR: NEGATIVE

## 2022-09-04 LAB — ACETAMINOPHEN LEVEL: Acetaminophen (Tylenol), Serum: <10 ug/mL — ABNORMAL LOW (ref 10–30)

## 2022-09-04 LAB — SALICYLATE LEVEL: Salicylate Lvl: <7 mg/dL — ABNORMAL LOW (ref 7.0–30.0)

## 2022-09-04 LAB — ETHANOL: Alcohol, Ethyl (B): 15 mg/dL — ABNORMAL HIGH (ref <10)

## 2022-09-04 MED ORDER — TRAZODONE HCL 100 MG PO TABS
100.0000 mg | ORAL_TABLET | Freq: Every evening | ORAL | Status: DC | PRN
  Administered 2022-09-05: 100 mg via ORAL
  Filled 2022-09-04: qty 1

## 2022-09-04 MED ORDER — LORAZEPAM 1 MG PO TABS
1.0000 mg | ORAL_TABLET | Freq: Once | ORAL | Status: AC
  Administered 2022-09-04: 1 mg via ORAL
  Filled 2022-09-04: qty 1

## 2022-09-04 NOTE — ED Notes (Signed)
Pt. Alert and oriented, warm and dry, in no distress. Pt. Denies HI, and VH. Pt states he is having Si thoughts without a plan and is hearing voices to kill himself. Patient asking if we can give him his Invega shot, per patient he does not remember the last time he received it. Pt. Encouraged to let nursing staff know of any concerns or needs.
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
## 2022-09-04 NOTE — ED Notes (Signed)
ED Provider at bedside. 

## 2022-09-04 NOTE — ED Notes (Signed)

## 2022-09-04 NOTE — ED Notes (Signed)
Pt dressed out by this RN & EDT Wendelyn Breslow) belongings include:   1 black shirt 1 pair of black multicolored boxers 1 pair of brown boots 1 pair of black sweat pants
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
## 2022-09-05 DIAGNOSIS — F1410 Cocaine abuse, uncomplicated: Secondary | ICD-10-CM

## 2022-09-05 NOTE — ED Provider Notes (Addendum)
-----------------------------------------   6:39 AM on 09/05/2022 -----------------------------------------   No events overnight.  Discussed again with overnight psychiatric team who recommends discharge home this morning.  Does not require psychiatric reassessment.  Patient is currently awake and eager for discharge home.  Strict return precautions given.  Patient verbalizes understanding agrees with plan of care.     Paulette Blanch, MD 09/05/22 [phone]
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
## 2022-09-05 NOTE — Discharge Instructions (Signed)
Return to the ER for worsening symptoms or other concerns.

## 2022-09-05 NOTE — ED Notes (Signed)
Patient discharged. Patient was upset with writer due to him not getting Invega injection.  Writer advised patient he needs to follow up with [facility] or his provider that he sees for mental health. Patient then states, "all you have did for me was give me some water. You didn't even give me any food." Patient advised he was seen and was given his PRN sleeping pill that was ordered. Patient became upset and started walking out. Security talked patient into changing out his clothes. Patient went to hallway bathroom and changed. Patient placed behavioral scrubs in toilet. Patient walked out with security following.

## 2022-09-07 ENCOUNTER — Other Ambulatory Visit: Admission: RE | Admit: 2022-09-07 | Payer: Medicaid Other | Source: Intra-hospital | Admitting: Psychiatry

## 2022-09-07 ENCOUNTER — Emergency Department: Payer: No Typology Code available for payment source

## 2022-09-07 ENCOUNTER — Other Ambulatory Visit: Payer: Self-pay

## 2022-09-07 ENCOUNTER — Inpatient Hospital Stay
Admission: AD | Admit: 2022-09-07 | Discharge: 2022-09-11 | DRG: 885 | Disposition: A | Discharge status: Home / Self Care | Payer: No Typology Code available for payment source | Source: Intra-hospital | Attending: Psychiatry | Admitting: Psychiatry

## 2022-09-07 ENCOUNTER — Emergency Department (EMERGENCY_DEPARTMENT_HOSPITAL)
Admission: EM | Admit: 2022-09-07 | Discharge: 2022-09-07 | Disposition: A | Discharge status: Other Acute Inpatient Hospital | Payer: No Typology Code available for payment source | Source: Home / Self Care | Attending: Emergency Medicine | Admitting: Emergency Medicine

## 2022-09-07 DIAGNOSIS — F209 Schizophrenia, unspecified: Secondary | ICD-10-CM | POA: Insufficient documentation

## 2022-09-07 DIAGNOSIS — M79606 Pain in leg, unspecified: Secondary | ICD-10-CM | POA: Diagnosis present

## 2022-09-07 DIAGNOSIS — F1494 Cocaine use, unspecified with cocaine-induced mood disorder: Secondary | ICD-10-CM

## 2022-09-07 DIAGNOSIS — F17210 Nicotine dependence, cigarettes, uncomplicated: Secondary | ICD-10-CM | POA: Diagnosis present

## 2022-09-07 DIAGNOSIS — F1290 Cannabis use, unspecified, uncomplicated: Secondary | ICD-10-CM | POA: Diagnosis present

## 2022-09-07 DIAGNOSIS — Z20822 Contact with and (suspected) exposure to covid-19: Secondary | ICD-10-CM | POA: Diagnosis present

## 2022-09-07 DIAGNOSIS — F251 Schizoaffective disorder, depressive type: Secondary | ICD-10-CM | POA: Diagnosis present

## 2022-09-07 DIAGNOSIS — Z818 Family history of other mental and behavioral disorders: Secondary | ICD-10-CM | POA: Diagnosis not present

## 2022-09-07 DIAGNOSIS — Z1152 Encounter for screening for COVID-19: Secondary | ICD-10-CM | POA: Insufficient documentation

## 2022-09-07 DIAGNOSIS — R072 Precordial pain: Secondary | ICD-10-CM | POA: Diagnosis present

## 2022-09-07 DIAGNOSIS — F1414 Cocaine abuse with cocaine-induced mood disorder: Secondary | ICD-10-CM | POA: Insufficient documentation

## 2022-09-07 DIAGNOSIS — F1490 Cocaine use, unspecified, uncomplicated: Secondary | ICD-10-CM | POA: Insufficient documentation

## 2022-09-07 DIAGNOSIS — R079 Chest pain, unspecified: Secondary | ICD-10-CM | POA: Insufficient documentation

## 2022-09-07 DIAGNOSIS — B182 Chronic viral hepatitis C: Secondary | ICD-10-CM | POA: Diagnosis present

## 2022-09-07 DIAGNOSIS — F1410 Cocaine abuse, uncomplicated: Secondary | ICD-10-CM | POA: Insufficient documentation

## 2022-09-07 DIAGNOSIS — R45851 Suicidal ideations: Secondary | ICD-10-CM | POA: Insufficient documentation

## 2022-09-07 LAB — ETHANOL: Alcohol, Ethyl (B): <10 mg/dL (ref <10)

## 2022-09-07 LAB — CBC
HCT: 37.9 % — ABNORMAL LOW (ref 39.0–52.0)
Hemoglobin: 13.3 g/dL (ref 13.0–17.0)
MCH: 28.4 pg (ref 26.0–34.0)
MCHC: 35.1 g/dL (ref 30.0–36.0)
MCV: 80.8 fL (ref 80.0–100.0)
Platelets: 274 10*3/uL (ref 150–400)
RBC: 4.69 MIL/uL (ref 4.22–5.81)
RDW: 15 % (ref 11.5–15.5)
WBC: 11.1 10*3/uL — ABNORMAL HIGH (ref 4.0–10.5)
nRBC: 0 % (ref 0.0–0.2)

## 2022-09-07 LAB — COMPREHENSIVE METABOLIC PANEL
ALT: 20 U/L (ref 0–44)
AST: 32 U/L (ref 15–41)
Albumin: 4.5 g/dL (ref 3.5–5.0)
Alkaline Phosphatase: 99 U/L (ref 38–126)
Anion gap: 7 (ref 5–15)
BUN: 10 mg/dL (ref 6–20)
CO2: 27 mmol/L (ref 22–32)
Calcium: 9.5 mg/dL (ref 8.9–10.3)
Chloride: 105 mmol/L (ref 98–111)
Creatinine, Ser: 0.83 mg/dL (ref 0.61–1.24)
GFR, Estimated: >60 mL/min (ref >60)
Glucose, Bld: 99 mg/dL (ref 70–99)
Potassium: 4 mmol/L (ref 3.5–5.1)
Sodium: 139 mmol/L (ref 135–145)
Total Bilirubin: 1.3 mg/dL — ABNORMAL HIGH (ref 0.3–1.2)
Total Protein: 8.9 g/dL — ABNORMAL HIGH (ref 6.5–8.1)

## 2022-09-07 LAB — RESP PANEL BY RT-PCR (FLU A&B, COVID) ARPGX2
Influenza A by PCR: NEGATIVE
Influenza B by PCR: NEGATIVE
SARS Coronavirus 2 by RT PCR: NEGATIVE

## 2022-09-07 LAB — URINE DRUG SCREEN, QUALITATIVE (ARMC ONLY)
Amphetamines, Ur Screen: NOT DETECTED
Barbiturates, Ur Screen: NOT DETECTED
Benzodiazepine, Ur Scrn: NOT DETECTED
Cannabinoid 50 Ng, Ur ~~LOC~~: POSITIVE — AB
Cocaine Metabolite,Ur ~~LOC~~: POSITIVE — AB
MDMA (Ecstasy)Ur Screen: NOT DETECTED
Methadone Scn, Ur: NOT DETECTED
Opiate, Ur Screen: NOT DETECTED
Phencyclidine (PCP) Ur S: NOT DETECTED
Tricyclic, Ur Screen: NOT DETECTED

## 2022-09-07 LAB — TROPONIN I (HIGH SENSITIVITY)
Troponin I (High Sensitivity): 8 ng/L (ref <18)
Troponin I (High Sensitivity): 7 ng/L (ref <18)

## 2022-09-07 MED ORDER — LORAZEPAM 1 MG PO TABS
1.0000 mg | ORAL_TABLET | Freq: Once | ORAL | Status: AC
  Administered 2022-09-07: 1 mg via ORAL
  Filled 2022-09-07: qty 1

## 2022-09-07 MED ORDER — GABAPENTIN 300 MG PO CAPS
300.0000 mg | ORAL_CAPSULE | Freq: Three times a day (TID) | ORAL | Status: DC
  Administered 2022-09-08 – 2022-09-11 (×10): 300 mg via ORAL
  Filled 2022-09-07 (×10): qty 1

## 2022-09-07 MED ORDER — PALIPERIDONE ER 3 MG PO TB24
6.0000 mg | ORAL_TABLET | Freq: Every day | ORAL | Status: DC
  Administered 2022-09-08 – 2022-09-11 (×4): 6 mg via ORAL
  Filled 2022-09-07 (×4): qty 2

## 2022-09-07 MED ORDER — GABAPENTIN 300 MG PO CAPS
300.0000 mg | ORAL_CAPSULE | Freq: Three times a day (TID) | ORAL | Status: DC
  Administered 2022-09-07 (×3): 300 mg via ORAL
  Filled 2022-09-07 (×3): qty 1

## 2022-09-07 MED ORDER — PALIPERIDONE ER 3 MG PO TB24: 6.0000 mg | ORAL_TABLET | Freq: Every day | ORAL | Status: DC

## 2022-09-07 MED ORDER — ACETAMINOPHEN 325 MG PO TABS
650.0000 mg | ORAL_TABLET | Freq: Four times a day (QID) | ORAL | Status: DC | PRN
  Administered 2022-09-08 – 2022-09-09 (×3): 650 mg via ORAL
  Filled 2022-09-07 (×3): qty 2

## 2022-09-07 MED ORDER — MAGNESIUM HYDROXIDE 400 MG/5ML PO SUSP: 30.0000 mL | Freq: Every day | ORAL | Status: DC | PRN

## 2022-09-07 MED ORDER — PALIPERIDONE ER 3 MG PO TB24
3.0000 mg | ORAL_TABLET | Freq: Every day | ORAL | Status: DC
  Administered 2022-09-07: 3 mg via ORAL
  Filled 2022-09-07: qty 1

## 2022-09-07 MED ORDER — ALUM & MAG HYDROXIDE-SIMETH 200-200-20 MG/5ML PO SUSP: 30.0000 mL | ORAL | Status: DC | PRN

## 2022-09-07 NOTE — ED Notes (Signed)
VOL per Roselyn Reef, pending disposition

## 2022-09-07 NOTE — BH Assessment (Signed)
Patient is to be admitted to [facility] by Psychiatric Nurse Practitioner Waylan Boga.  Attending Physician will be Dr.  Weber Cooks .   Patient has been assigned to room 320, by [facility] Charge Nurse Demetria.   Intake Paper Work has been signed and placed on patient chart.  ER staff is aware of the admission: Apolonio Schneiders, ER Secretary   Dr. Cheri Fowler, ER MD  Fredrich Birks, Patient's Nurse  Helene Kelp, Patient Access.   Transport time TBD.

## 2022-09-07 NOTE — Consult Note (Addendum)
[facility] Face-to-Face Psychiatry Consult   Reason for Consult:  Cocaine abuse Referring Physician:  EDP Patient Identification: Dominic [name] MRN:  [id number] Principal Diagnosis: Cocaine-induced mood disorder (HCC) Diagnosis:  Principal Problem:   Cocaine-induced mood disorder (HCC)  Total Time spent with patient:20  Subjective:   Dominic [name] is a 46 y/o black, English-speaking male with a history of schizoaffective disorder, depressive type. Patient arrived on 10/26 at approximately 0400 by [facility] for chest pain after smoking crack. Per notes, patient endorsed suicidal thoughts and auditory hallucinations upon admission. Patient had previously been discharged from ER Tuesday morning 10/24. Patient's urine drug screen on admission was positive for cocaine and cannabis. Bloodwork unremarkable except for slightly elevated WBC's (11.1) and slightly low HCT (37.9).   HPI:   Dominic [name] is a 46 y/o black, English-speaking male with a history of schizoaffective disorder, depressive type. Patient arrived on 10/26 at approximately 0400 by [facility] for chest pain after smoking crack. Per notes, patient endorsed suicidal thoughts and auditory hallucinations upon admission. Patient had previously been discharged from ER Tuesday morning 10/24.  Patient's urine drug screen on admission was positive for cocaine and cannabis. Bloodwork unremarkable except for slightly elevated WBC's (11.1) and slightly low HCT (37.9).  Upon assessment this AM, patient was lying in bed with lights off but awoke and sat up for interview. Patient appeared casually-groomed with a calm demeanor. Speech was normal rate and volume, with lucid, logical thought process and content. Mood was somewhat anxious and affect was congruent with mood. Patient was oriented to person, place, date/time. Psychomotor activity was within normal limits with no akathesia or extra pyramidal symtoms.  When asked what brought him  in, patient stated, "I'm really tired. I want to stop using drugs. I felt suicidal after doing drugs. I really need my Invega shot." Patient reported he last "got my Invega shot at [facility]" but stated that he has not followed up with [facility] or another psych facility recently. Patient states he really needs to stop doing drugs because he is tired and his 14 year old daughter is begging him to stop.  Patient had good concentration, attention, and fair recent memory. Protective factors/assets include: patient requesting help with substance abuse, patient expressed desire/motivation for change; Pt requested help  and expressed motivation for change. Risk factors include social circles in the area that contribute to his drug use. In view of this, patient is requesting drug treatment at [facility] facility.  Patient denied current suicidal and homicidal thoughts as well as auditory/visual hallucinations.  Collateral gathered from [facility] clarified that patient last received injection of Abilify (400mg ) from [facility] in January 2022.  Past Psychiatric History: History of alcohol use disorder, bipolar disorder, anxiety, and schizophrenia who is well-known to this emergency department.  Risk to Self:  none Risk to Others: none  Prior Inpatient Therapy:  several Prior Outpatient Therapy:  none currently  Past Medical History:  Past Medical History:  Diagnosis Date   Alcohol intoxication, uncomplicated (HCC) 01/13/2019   Anxiety    Asthma    Bipolar 1 disorder (HCC)    Chronic hepatitis C without hepatic coma (HCC) 05/28/2018   Depression    Hepatitis C    Hepatitis C antibody positive in blood 05/28/2018   Malingering 03/30/2022   Psychiatry note from [facility] states "directly admitted to fabricating SI and [facility] to obtain [psychiatric] admission"   Schizophrenia [facility])    Sleep apnea     Past Surgical History:  Procedure  Laterality Date   CYST REMOVAL NECK     neck   INTRAMEDULLARY (IM) NAIL FIBULA Right  07/14/2022   Procedure: INTRAMEDULLARY (IM) NAIL FIBULA;  Surgeon: Ross Marcusrawford, Matthew, MD;  Location: [facility] ORS;  Service: Orthopedics;  Laterality: Right;   TIBIA IM NAIL INSERTION Right 07/14/2022   Procedure: INTRAMEDULLARY (IM) NAIL TIBIAL;  Surgeon: Ross Marcusrawford, Matthew, MD;  Location: [facility] ORS;  Service: Orthopedics;  Laterality: Right;   Family History:  Family History  Problem Relation Age of Onset   Hypertension Sister    Schizophrenia Sister    Family Psychiatric  History: none known Social History: Patient has 512 y/o daughter who is influencing his desire to quit using drugs. Social History   Substance and Sexual Activity  Alcohol Use Yes   Alcohol/week: 3.0 standard drinks of alcohol   Types: 3 Shots of liquor per week     Social History   Substance and Sexual Activity  Drug Use Yes   Types: Cocaine, Marijuana, Methamphetamines   Comment: last night cocaine    Social History   Socioeconomic History   Marital status: Single    Spouse name: Not on file   Number of children: Not on file   Years of education: Not on file   Highest education level: Not on file  Occupational History   Not on file  Tobacco Use   Smoking status: Some Days    Packs/day: 1.00    Types: Cigarettes   Smokeless tobacco: Never  Vaping Use   Vaping Use: Never used  Substance and Sexual Activity   Alcohol use: Yes    Alcohol/week: 3.0 standard drinks of alcohol    Types: 3 Shots of liquor per week   Drug use: Yes    Types: Cocaine, Marijuana, Methamphetamines    Comment: last night cocaine   Sexual activity: Not Currently  Other Topics Concern   Not on file  Social History Narrative   Not on file   Social Determinants of Health   Financial Resource Strain: Low Risk  (11/25/2018)   Overall Financial Resource Strain (CARDIA)    Difficulty of Paying Living Expenses: Not very hard  Food Insecurity: No Food Insecurity (07/21/2022)   Hunger Vital Sign    Worried About Running Out of Food in  the Last Year: Never true    Ran Out of Food in the Last Year: Never true  Transportation Needs: No Transportation Needs (07/21/2022)   PRAPARE - Administrator, Civil ServiceTransportation    Lack of Transportation (Medical): No    Lack of Transportation (Non-Medical): No  Physical Activity: Inactive (11/25/2018)   Exercise Vital Sign    Days of Exercise per Week: 0 days    Minutes of Exercise per Session: 0 min  Stress: No Stress Concern Present (11/25/2018)   [employer] - Occupational Stress Questionnaire    Feeling of Stress : Only a little  Social Connections: Unknown (11/25/2018)   Social Connection and Isolation Panel [NHANES]    Frequency of Communication with Friends and Family: Twice a week    Frequency of Social Gatherings with Friends and Family: Twice a week    Attends Religious Services: Never    Database administratorActive Member of Clubs or Organizations: No    Attends BankerClub or Organization Meetings: Never    Marital Status: Patient refused   Additional Social History:    Allergies:  No Known Allergies  Labs:  Results for orders placed or performed during the hospital encounter of 09/07/22 (from the past  48 hour(s))  CBC     Status: Abnormal   Collection Time: 09/07/22  4:16 AM  Result Value Ref Range   WBC 11.1 (H) 4.0 - 10.5 K/uL   RBC 4.69 4.22 - 5.81 MIL/uL   Hemoglobin 13.3 13.0 - 17.0 g/dL   HCT 37.9 (L) 39.0 - 52.0 %   MCV 80.8 80.0 - 100.0 fL   MCH 28.4 26.0 - 34.0 pg   MCHC 35.1 30.0 - 36.0 g/dL   RDW 15.0 11.5 - 15.5 %   Platelets 274 150 - 400 K/uL   nRBC 0.0 0.0 - 0.2 %    Comment: Performed at [facility], [address], [place], [place] [address]  Comprehensive metabolic panel     Status: Abnormal   Collection Time: 09/07/22  4:16 AM  Result Value Ref Range   Sodium 139 135 - 145 mmol/L   Potassium 4.0 3.5 - 5.1 mmol/L   Chloride 105 98 - 111 mmol/L   CO2 27 22 - 32 mmol/L   Glucose, Bld 99 70 - 99 mg/dL    Comment: Glucose reference range applies  only to samples taken after fasting for at least 8 hours.   BUN 10 6 - 20 mg/dL   Creatinine, Ser 0.83 0.61 - 1.24 mg/dL   Calcium 9.5 8.9 - 10.3 mg/dL   Total Protein 8.9 (H) 6.5 - 8.1 g/dL   Albumin 4.5 3.5 - 5.0 g/dL   AST 32 15 - 41 U/L   ALT 20 0 - 44 U/L   Alkaline Phosphatase 99 38 - 126 U/L   Total Bilirubin 1.3 (H) 0.3 - 1.2 mg/dL   GFR, Estimated >60 >60 mL/min    Comment: (NOTE) Calculated using the CKD-EPI Creatinine Equation (2021)    Anion gap 7 5 - 15    Comment: Performed at [facility], [address] [place], [place] [address]  Ethanol     Status: None   Collection Time: 09/07/22  4:16 AM  Result Value Ref Range   Alcohol, Ethyl (B) <10 <10 mg/dL    Comment: (NOTE) Lowest detectable limit for serum alcohol is 10 mg/dL.  For medical purposes only. Performed at [facility], [place], [place] [address]   Troponin I (High Sensitivity)     Status: None   Collection Time: 09/07/22  4:16 AM  Result Value Ref Range   Troponin I (High Sensitivity) 8 <18 ng/L    Comment: (NOTE) Elevated high sensitivity troponin I (hsTnI) values and significant  changes across serial measurements may suggest ACS but many other  chronic and acute conditions are known to elevate hsTnI results.  Refer to the "Links" section for chest pain algorithms and additional  guidance. Performed at [facility], [address] [place], [place] [address]     No current facility-administered medications for this encounter.   Current Outpatient Medications  Medication Sig Dispense Refill   oxyCODONE (ROXICODONE) 5 MG immediate release tablet Take 1 tablet (5 mg total) by mouth every 6 (six) hours as needed for severe pain. (Patient not taking: Reported on 09/04/2022) 8 tablet 0    Musculoskeletal: Strength & Muscle Tone: within normal limits Gait & Station: normal Patient leans: N/A  Psychiatric Specialty Exam: Physical Exam Vitals and  nursing note reviewed.  Constitutional:      General: He is not in acute distress.    Appearance: Normal appearance. He is well-developed. He is not ill-appearing, toxic-appearing or diaphoretic.  HENT:     Head: Normocephalic.     Nose: Nose normal.  Pulmonary:     Effort: Pulmonary effort is normal.  Musculoskeletal:        General: Normal range of motion.     Cervical back: Normal range of motion.  Neurological:     General: No focal deficit present.     Mental Status: He is alert and oriented to person, place, and time.  Psychiatric:        Attention and Perception: Attention normal. He perceives auditory hallucinations.        Mood and Affect: Mood is anxious.        Speech: Speech normal.        Behavior: Behavior normal. Behavior is cooperative.        Thought Content: Thought content includes suicidal ideation.        Cognition and Memory: Cognition and memory normal.        Judgment: Judgment normal.     Review of Systems  Psychiatric/Behavioral:  Positive for depression, substance abuse and suicidal ideas. The patient is nervous/anxious.   All other systems reviewed and are negative.   Blood pressure 136/86, pulse 64, temperature 98.1 F (36.7 C), temperature source Oral, resp. rate 16, height 6' (1.829 m), weight 101 kg, SpO2 98 %.Body mass index is 30.2 kg/m.  General Appearance: Casual  Eye Contact:  Good  Speech:  Normal Rate  Volume:  Normal  Mood:  Anxious  Affect:  Congruent  Thought Process:  Coherent  Orientation:  Full (Time, Place, and Person)  Thought Content:  WDL and Logical  Suicidal Thoughts:  Yes  Homicidal Thoughts:  No  Memory:  Immediate;   Good Recent;   Good Remote;   Good  Judgement:  Fair  Insight:  Fair  Psychomotor Activity:  Normal  Concentration:  Concentration: Good and Attention Span: Good  Recall:  Good  Fund of Knowledge:  Fair  Language:  Good  Akathisia:  No  Handed:  Right  AIMS (if indicated):     Assets:  Leisure  Time Physical Health Resilience Social Support  ADL's:  Intact  Cognition:  WNL  Sleep:        Physical Exam: Physical Exam Vitals and nursing note reviewed.  Constitutional:      General: He is not in acute distress.    Appearance: Normal appearance. He is well-developed. He is not ill-appearing, toxic-appearing or diaphoretic.  HENT:     Head: Normocephalic.     Nose: Nose normal.  Pulmonary:     Effort: Pulmonary effort is normal.  Musculoskeletal:        General: Normal range of motion.     Cervical back: Normal range of motion.  Neurological:     General: No focal deficit present.     Mental Status: He is alert and oriented to person, place, and time.  Psychiatric:        Attention and Perception: Attention normal. He perceives auditory hallucinations.        Mood and Affect: Mood is anxious.        Speech: Speech normal.        Behavior: Behavior normal. Behavior is cooperative.        Thought Content: Thought content includes suicidal ideation.        Cognition and Memory: Cognition and memory normal.        Judgment: Judgment normal.    Review of Systems  Psychiatric/Behavioral:  Positive  for depression, substance abuse and suicidal ideas. The patient is nervous/anxious.   All other systems reviewed and are negative.  Blood pressure 136/86, pulse 64, temperature 98.1 F (36.7 C), temperature source Oral, resp. rate 16, height 6' (1.829 m), weight 101 kg, SpO2 98 %. Body mass index is 30.2 kg/m.  Treatment Plan Summary: Daily contact with patient to assess and evaluate symptoms and progress in treatment, Medication management, and Plan :  Cocaine and alcohol abuse with induced mood disorder: Gabapentin 300 mg TID  Schizophrenia: Invega 3 mg oral daily  Disposition:   Nanine Means, NP 09/07/2022 6:27 AM
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
## 2022-09-07 NOTE — ED Notes (Signed)
Dinner tray provided

## 2022-09-07 NOTE — ED Notes (Signed)
Hospital meal provided.  100% consumed, pt tolerated w/o complaints.  Waste discarded appropriately.   
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
## 2022-09-07 NOTE — ED Notes (Signed)
VOl moved to [employer]

## 2022-09-07 NOTE — ED Triage Notes (Signed)
Patient arrived by [facility] for chest pain after smoking crack, SI thought and hearing voices. Patient was discharged from ER Tuesday morning. Patient states he needs his invaga shot, but has not followed up with [facility] or other psych facility. Patient states he really needs to stop doing drugs, cause his 45 year old daughter is asking him to stop. Patient verbally contracts for safety with this Probation officer.  Patient was dressed out by this Probation officer with fellow RN Annie Main in room.  Patient belongings placed in bag and locked in belongings room.  1 pair of brown/tan color boots 1 pair boxer under wear 1 khaki color pants 1 white tank top 1 black shirt.  Bag labeled with patient info.

## 2022-09-08 ENCOUNTER — Encounter: Payer: Self-pay | Admitting: Psychiatry

## 2022-09-08 ENCOUNTER — Other Ambulatory Visit: Payer: Self-pay

## 2022-09-08 DIAGNOSIS — F251 Schizoaffective disorder, depressive type: Principal | ICD-10-CM

## 2022-09-08 MED ORDER — NICOTINE 14 MG/24HR TD PT24
14.0000 mg | MEDICATED_PATCH | Freq: Every day | TRANSDERMAL | Status: DC
  Filled 2022-09-08 (×2): qty 1

## 2022-09-08 MED ORDER — ADULT MULTIVITAMIN W/MINERALS CH
1.0000 | ORAL_TABLET | Freq: Every day | ORAL | Status: DC
  Administered 2022-09-08 – 2022-09-11 (×4): 1 via ORAL
  Filled 2022-09-08 (×4): qty 1

## 2022-09-08 MED ORDER — ENSURE ENLIVE PO LIQD
237.0000 mL | Freq: Three times a day (TID) | ORAL | Status: DC
  Administered 2022-09-08 – 2022-09-11 (×10): 237 mL via ORAL

## 2022-09-08 NOTE — Tx Team (Signed)
Initial Treatment Plan 09/08/2022 12:49 AM Dominic [name] [id number]    PATIENT STRESSORS: Health problems   Substance abuse     PATIENT STRENGTHS: Communication skills  Motivation for treatment/growth    PATIENT IDENTIFIED PROBLEMS: Ineffective coping skills  Substance Abuse                   DISCHARGE CRITERIA:  Improved stabilization in mood, thinking, and/or behavior Motivation to continue treatment in a less acute level of care  PRELIMINARY DISCHARGE PLAN: Attend 12-step recovery group Outpatient therapy  PATIENT/FAMILY INVOLVEMENT: This treatment plan has been presented to and reviewed with the patient, Dominic [name], and/or family member.  The patient and family have been given the opportunity to ask questions and make suggestions.  Aleen Sells, RN 09/08/2022, 12:49 AM
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
## 2022-09-08 NOTE — BHH Suicide Risk Assessment (Signed)
[facility] Admission Suicide Risk Assessment   Nursing information obtained from:  Patient Demographic factors:  Male, Low socioeconomic status, Unemployed Current Mental Status:  Self-harm thoughts Loss Factors:  Loss of significant relationship, Decline in physical health Historical Factors:  Family history of mental illness or substance abuse, Prior suicide attempts, Impulsivity Risk Reduction Factors:  NA  Total Time spent with patient: 45 minutes Principal Problem: Schizoaffective disorder, depressive type ([place]) Diagnosis:  Principal Problem:   Schizoaffective disorder, depressive type ([place])  Subjective Data: "I promised my daughter I would get some help."  Continued Clinical Symptoms:  Alcohol Use Disorder Identification Test Final Score (AUDIT): 0 The "Alcohol Use Disorders Identification Test", Guidelines for Use in Primary Care, Second Edition.  World Pharmacologist [facility]). Score between 0-7:  no or low risk or alcohol related problems. Score between 8-15:  moderate risk of alcohol related problems. Score between 16-19:  high risk of alcohol related problems. Score 20 or above:  warrants further diagnostic evaluation for alcohol dependence and treatment.   CLINICAL FACTORS:   Depression and anxiety   Musculoskeletal: Strength & Muscle Tone: within normal limits Gait & Station: normal Patient leans: N/A  General Appearance: Disheveled  Eye Contact:  Good  Speech:  Clear and Coherent  Volume:  Normal  Mood:  Anxious and Hopeless  Affect:  Blunt  Thought Process:  Linear  Orientation:  Full (Time, Place, and Person)  Thought Content:  Paranoid Ideation  Suicidal Thoughts:  Yes.  with intent/plan  Homicidal Thoughts:  No  Memory:  Immediate;   Good Recent;   Good Remote;   Good  Judgement:  Impaired  Insight:  Fair  Psychomotor Activity:  Normal  Concentration:  Concentration: Fair and Attention Span: Fair  Recall:  Good  Fund of Knowledge:  Good  Language:   Good  Akathisia:  No  Handed:  Left  AIMS (if indicated):     Assets:  Housing  ADL's:  Intact  Cognition:  WNL  Sleep:  Number of Hours: 7        Physical Exam: Physical Exam Vitals and nursing note reviewed.  Constitutional:      Appearance: Normal appearance.  HENT:     Head: Normocephalic.     Nose: Nose normal.  Pulmonary:     Effort: Pulmonary effort is normal.  Musculoskeletal:        General: Normal range of motion.     Cervical back: Normal range of motion.  Neurological:     General: No focal deficit present.     Mental Status: He is alert and oriented to person, place, and time.  Psychiatric:        Attention and Perception: Attention and perception normal.        Mood and Affect: Mood is anxious and depressed.        Speech: Speech normal.        Behavior: Behavior normal. Behavior is cooperative.        Thought Content: Thought content normal.        Cognition and Memory: Cognition and memory normal.        Judgment: Judgment normal.    Review of Systems  Psychiatric/Behavioral:  Positive for depression and substance abuse. The patient is nervous/anxious.   All other systems reviewed and are negative.  Blood pressure 119/78, pulse 88, temperature (!) 97.5 F (36.4 C), temperature source Oral, resp. rate 18, height 5\' 11"  (1.803 m), weight 78 kg, SpO2 100 %. Body mass  index is 23.99 kg/m.   COGNITIVE FEATURES THAT CONTRIBUTE TO RISK:  None    SUICIDE RISK:   Mild:  Suicidal ideation of limited frequency, intensity, duration, and specificity.  There are no identifiable plans, no associated intent, mild dysphoria and related symptoms, good self-control (both objective and subjective assessment), few other risk factors, and identifiable protective factors, including available and accessible social support.  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   Waylan Boga, NP 09/08/2022, 1:56 PM
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
## 2022-09-08 NOTE — Plan of Care (Signed)
D- Patient alert and oriented. Patient presented in a pleasant mood on assessment reporting that he slept "good" last night and had continued complaints of chronic leg pain. Patient rated his pain a "10/10" , in which he did request PRN pain medication from this Probation officer. Patient endorsed hopelessness, depression and anxiety on his self-inventory, stating that "my girlfriend broke up with me". Patient denied SI, HI, AVH at this time. Per his self-inventory, patient's goal for today is "to get close to God", in which he will "rest", in order to achieve his goal.  A- Scheduled medications administered to patient, per MD orders. Support and encouragement provided.  Routine safety checks conducted every 15 minutes.  Patient informed to notify staff with problems or concerns.  R- No adverse drug reactions noted. Patient contracts for safety at this time. Patient compliant with medications, he has not attended any unit groups as of yet. Patient receptive, calm, and cooperative. Patient remains safe at this time.  Problem: Education: Goal: Knowledge of General Education information will improve Description: Including pain rating scale, medication(s)/side effects and non-pharmacologic comfort measures Outcome: Not Progressing   Problem: Health Behavior/Discharge Planning: Goal: Ability to manage health-related needs will improve Outcome: Not Progressing   Problem: Clinical Measurements: Goal: Ability to maintain clinical measurements within normal limits will improve Outcome: Not Progressing Goal: Will remain free from infection Outcome: Not Progressing Goal: Diagnostic test results will improve Outcome: Not Progressing Goal: Respiratory complications will improve Outcome: Not Progressing Goal: Cardiovascular complication will be avoided Outcome: Not Progressing   Problem: Activity: Goal: Risk for activity intolerance will decrease Outcome: Not Progressing   Problem: Nutrition: Goal: Adequate  nutrition will be maintained Outcome: Not Progressing   Problem: Coping: Goal: Level of anxiety will decrease Outcome: Not Progressing   Problem: Elimination: Goal: Will not experience complications related to bowel motility Outcome: Not Progressing Goal: Will not experience complications related to urinary retention Outcome: Not Progressing   Problem: Pain Managment: Goal: General experience of comfort will improve Outcome: Not Progressing   Problem: Safety: Goal: Ability to remain free from injury will improve Outcome: Not Progressing   Problem: Skin Integrity: Goal: Risk for impaired skin integrity will decrease Outcome: Not Progressing   Problem: Education: Goal: Knowledge of [place] General Education information/materials will improve Outcome: Not Progressing Goal: Emotional status will improve Outcome: Not Progressing Goal: Mental status will improve Outcome: Not Progressing Goal: Verbalization of understanding the information provided will improve Outcome: Not Progressing   Problem: Activity: Goal: Interest or engagement in activities will improve Outcome: Not Progressing Goal: Sleeping patterns will improve Outcome: Not Progressing   Problem: Coping: Goal: Ability to verbalize frustrations and anger appropriately will improve Outcome: Not Progressing Goal: Ability to demonstrate self-control will improve Outcome: Not Progressing   Problem: Health Behavior/Discharge Planning: Goal: Identification of resources available to assist in meeting health care needs will improve Outcome: Not Progressing Goal: Compliance with treatment plan for underlying cause of condition will improve Outcome: Not Progressing   Problem: Physical Regulation: Goal: Ability to maintain clinical measurements within normal limits will improve Outcome: Not Progressing   Problem: Safety: Goal: Periods of time without injury will increase Outcome: Not Progressing   Problem:  Education: Goal: Ability to state activities that reduce stress will improve Outcome: Not Progressing   Problem: Coping: Goal: Ability to identify and develop effective coping behavior will improve Outcome: Not Progressing   Problem: Self-Concept: Goal: Ability to identify factors that promote anxiety will improve Outcome: Not Progressing Goal: Level  of anxiety will decrease Outcome: Not Progressing Goal: Ability to modify response to factors that promote anxiety will improve Outcome: Not Progressing

## 2022-09-08 NOTE — Progress Notes (Signed)
Recreation Therapy Notes  Date: 09/08/2022  Time: 10:40 am   Location: Craft room    Behavioral response: N/A   Intervention Topic: Happiness     Discussion/Intervention: Patient refused to attend group.   Clinical Observations/Feedback:  Patient refused to attend group.   Raychell Holcomb LRT/CTRS         Marnie Fazzino 09/08/2022 1:09 PM        Kable Haywood 09/08/2022 1:09 PM

## 2022-09-08 NOTE — Progress Notes (Signed)
This Probation officer was notified that patient's BP was slightly low at 104/52. Patient was given a Gatorade and encouraged to drink more fluids. Patient verbalized understanding. This information will also be passed along to the oncoming shift.

## 2022-09-08 NOTE — Progress Notes (Signed)
NUTRITION ASSESSMENT  Pt identified as at risk on the Malnutrition Screen Tool  INTERVENTION:  -MVI with minerals daily -Ensure Enlive po TID, each supplement provides 350 kcal and 20 grams of protein.   NUTRITION DIAGNOSIS: Unintentional weight loss related to sub-optimal intake as evidenced by pt report.   Goal: Pt to meet >/= 90% of their estimated nutrition needs.  Monitor:  PO intake  Assessment:  Pt admitted with cocaine induced mood disorder.   UDS positive for cocaine and cannabis.   Pt with good appetite. Per nursing notes, pt eating 100% of meals. Pt seeking inpatient treatment for substance abuse.   Reviewed wt hx; pt has experienced a 21.2% wt loss over the past 2 months, which is significant for time frame.   Medications reviewed.   Lab Results  Component Value Date   HGBA1C 5.0 02/16/2022   PTA DM medications are none.   Labs reviewed: CBGS: 107 (inpatient orders for glycemic control are none).    46 y.o. male  Height: Ht Readings from Last 1 Encounters:  09/07/22 5\' 11"  (1.803 m)    Weight: Wt Readings from Last 1 Encounters:  09/07/22 78 kg    Weight Hx: Wt Readings from Last 10 Encounters:  09/07/22 78 kg  09/07/22 101 kg  09/04/22 101 kg  07/20/22 99.8 kg  07/14/22 81.6 kg  07/09/22 99 kg  06/20/22 99.3 kg  06/14/22 99.3 kg  05/01/22 98.9 kg  04/16/22 83.9 kg    BMI:  Body mass index is 23.99 kg/m. BMI WDL.   Estimated Nutritional Needs: Kcal: 25-30 kcal/kg Protein: > 1 gram protein/kg Fluid: 1 ml/kcal  Diet Order:  Diet Order             Diet regular Room service appropriate? Yes; Fluid consistency: Thin  Diet effective 1000                  Pt is also offered choice of unit snacks mid-morning and mid-afternoon.  Pt is eating as desired.   Lab results and medications reviewed.   Loistine Chance, RD, LDN, [place] Registered Dietitian II Certified Diabetes Care and Education Specialist Please refer to [facility] for RD  and/or RD on-call/weekend/after hours pager

## 2022-09-08 NOTE — H&P (Signed)
Psychiatric Admission Assessment Adult  Patient Identification: Dominic [name] MRN:  [id number] Date of Evaluation:  09/08/2022 Chief Complaint:  Schizoaffective disorder, depressive type (HCC) [F25.1] Principal Diagnosis: Schizoaffective disorder, depressive type (HCC) Diagnosis:  Principal Problem:   Schizoaffective disorder, depressive type (HCC)  History of Present Illness: chart reviewed and patient assessed during rounds. CW is a 46 y/o A-A, English-speaking male with a history of schizoaffective disorder, depressive type. Patient arrived to the ED on 10/26  for chest pain after smoking crack. He admits to alcohol use as well as marijuana and fentanyl.  Upon assessment this morning, patient was roaming the hallways and talking on the phone. He appears disheveled and anxious, but cooperative with assessment. Speech was normal, coherent, and logical. Patient was oriented to person, place, date/time.   When asked what brought him to the hospital, patient stated, I had some bad crack and started hearing voices. Says he attempted suicide prior to admission by laying in the middle of a busy street. CW states he is still having suicidal ideations due to going through a lot recently, like the breakup with his girlfriend. Patient denies homicidal ideations, but says he can still hear voices laughing in his head. He denies anxiety and depression.     Initially patient was on the fence about going to rehab, but by the end of assessment he was agreeable to outpatient therapy/rehab because he states he wants to stop doing drugs.   HPI per TTS, Jasmine Faulcon on admission: Dominic [name] is a 46 year old., Black, English speaking male with a history of Schizoaffective disorder, depressive type. Per triage note:  Patient arrived by [facility] for chest pain after smoking crack, SI thought and hearing voices. Patient was discharged from ER Tuesday morning. Patient states he needs his Invega shot,  but has not followed up with [facility] or another psych facility. Patient states he really needs to stop doing drugs, cause his 18 year old daughter is asking him to stop. When asked what'd brought him in the pt. stated, "I'm really tired, I want to stop using drugs; I feel suicidal." Pt reported using crack cocaine and Fentanyl prior to arrival. Pt was visibly in distress and complaining of leg pain. Pt had restless psychomotor activity and slurred speech. Pt was cooperative throughout the assessment. Pt had adequate reality testing and impaired judgement. Pt requested help with substance abuse detox/treatment and expressed motivation for change. Pt identified his daughter as an anchor. Pt had an anxious mood; affect was congruent. Pt admitted to having auditory hallucinations. Pt denied current HI/VH. Pt's UDS positive for cocaine, and cannabis.   Associated Signs/Symptoms: Depression Symptoms:  suicidal thoughts with specific plan, suicidal attempt, Duration of Depression Symptoms: Greater than two weeks  (Hypo) Manic Symptoms:  Impulsivity, Anxiety Symptoms:  Excessive Worry, Psychotic Symptoms:  Hallucinations: Auditory Paranoia, PTSD Symptoms: NA Total Time spent with patient: 45 minutes  Past Psychiatric History: depression, schizoaffective disorder, polysubstance abuse  Is the patient at risk to self? Yes.    Has the patient been a risk to self in the past 6 months? Yes.    Has the patient been a risk to self within the distant past? No.  Is the patient a risk to others? No.  Has the patient been a risk to others in the past 6 months? No.  Has the patient been a risk to others within the distant past? No.   [place] Scale:  Flowsheet Row Admission (Current) from 09/07/2022 in [facility] INPATIENT BEHAVIORAL MEDICINE  Most recent reading at 09/08/2022 12:12 AM ED from 09/07/2022 in [facility] EMERGENCY DEPARTMENT Most recent reading at 09/07/2022  4:26 AM ED from 08/12/2022  in [facility] EMERGENCY DEPARTMENT Most recent reading at 08/12/2022  4:31 PM  C-SSRS RISK CATEGORY Low Risk Error: Q7 should not be populated when Q6 is No Error: Q3, 4, or 5 should not be populated when Q2 is No        Prior Inpatient Therapy:   Prior Outpatient Therapy:    Alcohol Screening: 1. How often do you have a drink containing alcohol?: Never 2. How many drinks containing alcohol do you have on a typical day when you are drinking?: 1 or 2 3. How often do you have six or more drinks on one occasion?: Never AUDIT-C Score: 0 4. How often during the last year have you found that you were not able to stop drinking once you had started?: Never 5. How often during the last year have you failed to do what was normally expected from you because of drinking?: Never 6. How often during the last year have you needed a first drink in the morning to get yourself going after a heavy drinking session?: Never 7. How often during the last year have you had a feeling of guilt of remorse after drinking?: Never 8. How often during the last year have you been unable to remember what happened the night before because you had been drinking?: Never 9. Have you or someone else been injured as a result of your drinking?: No 10. Has a relative or friend or a doctor or another health worker been concerned about your drinking or suggested you cut down?: No Alcohol Use Disorder Identification Test Final Score (AUDIT): 0 Alcohol Brief Interventions/Follow-up: Alcohol education/Brief advice Substance Abuse History in the last 12 months:  Yes.   Consequences of Substance Abuse: Family Consequences:    Previous Psychotropic Medications: Yes  Psychological Evaluations: Yes  Past Medical History:  Past Medical History:  Diagnosis Date   Alcohol intoxication, uncomplicated (HCC) 01/13/2019   Anxiety    Asthma    Bipolar 1 disorder (HCC)    Chronic hepatitis C without hepatic coma (HCC)  05/28/2018   Depression    Hepatitis C    Hepatitis C antibody positive in blood 05/28/2018   Malingering 03/30/2022   Psychiatry note from [facility] states "directly admitted to fabricating SI and CAH to obtain [psychiatric] admission"   Schizophrenia (HCC)    Sleep apnea     Past Surgical History:  Procedure Laterality Date   CYST REMOVAL NECK     neck   INTRAMEDULLARY (IM) NAIL FIBULA Right 07/14/2022   Procedure: INTRAMEDULLARY (IM) NAIL FIBULA;  Surgeon: Ross Marcus, MD;  Location: [facility] ORS;  Service: Orthopedics;  Laterality: Right;   TIBIA IM NAIL INSERTION Right 07/14/2022   Procedure: INTRAMEDULLARY (IM) NAIL TIBIAL;  Surgeon: Ross Marcus, MD;  Location: [facility] ORS;  Service: Orthopedics;  Laterality: Right;   Family History:  Family History  Problem Relation Age of Onset   Hypertension Sister    Schizophrenia Sister    Family Psychiatric  History: NA Tobacco Screening:   Social History:  Social History   Substance and Sexual Activity  Alcohol Use Yes   Alcohol/week: 3.0 standard drinks of alcohol   Types: 3 Shots of liquor per week     Social History   Substance and Sexual Activity  Drug Use Yes   Types: Cocaine, Marijuana, Methamphetamines,  Fentanyl   Comment: last night cocaine    Additional Social History:                           Allergies:  No Known Allergies Lab Results:  Results for orders placed or performed during the hospital encounter of 09/07/22 (from the past 48 hour(s))  CBC     Status: Abnormal   Collection Time: 09/07/22  4:16 AM  Result Value Ref Range   WBC 11.1 (H) 4.0 - 10.5 K/uL   RBC 4.69 4.22 - 5.81 MIL/uL   Hemoglobin 13.3 13.0 - 17.0 g/dL   HCT 40.9 (L) 81.1 - 91.4 %   MCV 80.8 80.0 - 100.0 fL   MCH 28.4 26.0 - 34.0 pg   MCHC 35.1 30.0 - 36.0 g/dL   RDW 78.2 95.6 - 21.3 %   Platelets 274 150 - 400 K/uL   nRBC 0.0 0.0 - 0.2 %    Comment: Performed at [facility], [address] [place], [place]  [address]  Comprehensive metabolic panel     Status: Abnormal   Collection Time: 09/07/22  4:16 AM  Result Value Ref Range   Sodium 139 135 - 145 mmol/L   Potassium 4.0 3.5 - 5.1 mmol/L   Chloride 105 98 - 111 mmol/L   CO2 27 22 - 32 mmol/L   Glucose, Bld 99 70 - 99 mg/dL    Comment: Glucose reference range applies only to samples taken after fasting for at least 8 hours.   BUN 10 6 - 20 mg/dL   Creatinine, Ser 8.46 0.61 - 1.24 mg/dL   Calcium 9.5 8.9 - 96.2 mg/dL   Total Protein 8.9 (H) 6.5 - 8.1 g/dL   Albumin 4.5 3.5 - 5.0 g/dL   AST 32 15 - 41 U/L   ALT 20 0 - 44 U/L   Alkaline Phosphatase 99 38 - 126 U/L   Total Bilirubin 1.3 (H) 0.3 - 1.2 mg/dL   GFR, Estimated >95 >28 mL/min    Comment: (NOTE) Calculated using the CKD-EPI Creatinine Equation (2021)    Anion gap 7 5 - 15    Comment: Performed at [facility], [address] [place], [place] [address]  Ethanol     Status: None   Collection Time: 09/07/22  4:16 AM  Result Value Ref Range   Alcohol, Ethyl (B) <10 <10 mg/dL    Comment: (NOTE) Lowest detectable limit for serum alcohol is 10 mg/dL.  For medical purposes only. Performed at [facility], [address] [place], [place] [address]   Troponin I (High Sensitivity)     Status: None   Collection Time: 09/07/22  4:16 AM  Result Value Ref Range   Troponin I (High Sensitivity) 8 <18 ng/L    Comment: (NOTE) Elevated high sensitivity troponin I (hsTnI) values and significant  changes across serial measurements may suggest ACS but many other  chronic and acute conditions are known to elevate hsTnI results.  Refer to the "Links" section for chest pain algorithms and additional  guidance. Performed at [facility], [address], [place], [place] [address]   Urine Drug Screen, Qualitative     Status: Abnormal   Collection Time: 09/07/22  4:16 AM  Result Value Ref Range   Tricyclic, Ur Screen NONE DETECTED NONE DETECTED   Amphetamines,  Ur Screen NONE DETECTED NONE DETECTED   MDMA (Ecstasy)Ur Screen NONE DETECTED NONE DETECTED  Cocaine Metabolite,Ur [place] POSITIVE (A) NONE DETECTED   Opiate, Ur Screen NONE DETECTED NONE DETECTED   Phencyclidine (PCP) Ur S NONE DETECTED NONE DETECTED   Cannabinoid 50 Ng, Ur [place] POSITIVE (A) NONE DETECTED   Barbiturates, Ur Screen NONE DETECTED NONE DETECTED   Benzodiazepine, Ur Scrn NONE DETECTED NONE DETECTED   Methadone Scn, Ur NONE DETECTED NONE DETECTED    Comment: (NOTE) Tricyclics + metabolites, urine    Cutoff 1000 ng/mL Amphetamines + metabolites, urine  Cutoff 1000 ng/mL MDMA (Ecstasy), urine              Cutoff 500 ng/mL Cocaine Metabolite, urine          Cutoff 300 ng/mL Opiate + metabolites, urine        Cutoff 300 ng/mL Phencyclidine (PCP), urine         Cutoff 25 ng/mL Cannabinoid, urine                 Cutoff 50 ng/mL Barbiturates + metabolites, urine  Cutoff 200 ng/mL Benzodiazepine, urine              Cutoff 200 ng/mL Methadone, urine                   Cutoff 300 ng/mL  The urine drug screen provides only a preliminary, unconfirmed analytical test result and should not be used for non-medical purposes. Clinical consideration and professional judgment should be applied to any positive drug screen result due to possible interfering substances. A more specific alternate chemical method must be used in order to obtain a confirmed analytical result. Gas chromatography / mass spectrometry (GC/MS) is the preferred confirm atory method. Performed at [facility], [address] [place], [place] [address]   Troponin I (High Sensitivity)     Status: None   Collection Time: 09/07/22  6:14 AM  Result Value Ref Range   Troponin I (High Sensitivity) 7 <18 ng/L    Comment: (NOTE) Elevated high sensitivity troponin I (hsTnI) values and significant  changes across serial measurements may suggest ACS but many other  chronic and acute conditions are known to elevate hsTnI  results.  Refer to the "Links" section for chest pain algorithms and additional  guidance. Performed at [facility], [address] [place], [place] [address]   Resp Panel by RT-PCR (Flu A&B, Covid) Anterior Nasal Swab     Status: None   Collection Time: 09/07/22 12:55 PM   Specimen: Anterior Nasal Swab  Result Value Ref Range   SARS Coronavirus 2 by RT PCR NEGATIVE NEGATIVE    Comment: (NOTE) SARS-CoV-2 target nucleic acids are NOT DETECTED.  The SARS-CoV-2 RNA is generally detectable in upper respiratory specimens during the acute phase of infection. The lowest concentration of SARS-CoV-2 viral copies this assay can detect is 138 copies/mL. A negative result does not preclude SARS-Cov-2 infection and should not be used as the sole basis for treatment or other patient management decisions. A negative result may occur with  improper specimen collection/handling, submission of specimen other than nasopharyngeal swab, presence of viral mutation(s) within the areas targeted by this assay, and inadequate number of viral copies(<138 copies/mL). A negative result must be combined with clinical observations, patient history, and epidemiological information. The expected result is Negative.  Fact Sheet for Patients:  BloggerCourse.com  Fact Sheet for Healthcare Providers:  SeriousBroker.it  This test is no t yet approved or cleared by the [place] and  has been authorized for  detection and/or diagnosis of SARS-CoV-2 by FDA under an Emergency Use Authorization (EUA). This EUA will remain  in effect (meaning this test can be used) for the duration of the COVID-19 declaration under Section 564(b)(1) of the Act, 21 U.S.C.section 360bbb-3(b)(1), unless the authorization is terminated  or revoked sooner.       Influenza A by PCR NEGATIVE NEGATIVE   Influenza B by PCR NEGATIVE NEGATIVE    Comment: (NOTE) The Xpert  Xpress SARS-CoV-2/FLU/RSV plus assay is intended as an aid in the diagnosis of influenza from Nasopharyngeal swab specimens and should not be used as a sole basis for treatment. Nasal washings and aspirates are unacceptable for Xpert Xpress SARS-CoV-2/FLU/RSV testing.  Fact Sheet for Patients: EntrepreneurPulse.com.au  Fact Sheet for Healthcare Providers: IncredibleEmployment.be  This test is not yet approved or cleared by the [place] FDA and has been authorized for detection and/or diagnosis of SARS-CoV-2 by FDA under an Emergency Use Authorization (EUA). This EUA will remain in effect (meaning this test can be used) for the duration of the COVID-19 declaration under Section 564(b)(1) of the Act, 21 U.S.C. section 360bbb-3(b)(1), unless the authorization is terminated or revoked.  Performed at [facility], [address] [place], [place] [address]     Blood Alcohol level:  Lab Results  Component Value Date   ETH <10 09/07/2022   ETH 15 (H) 71/69/6789    Metabolic Disorder Labs:  Lab Results  Component Value Date   HGBA1C 5.0 02/16/2022   MPG 96.8 02/16/2022   MPG 96.8 12/24/2021   No results found for: "PROLACTIN" Lab Results  Component Value Date   CHOL 167 02/16/2022   TRIG 40 02/16/2022   HDL 60 02/16/2022   CHOLHDL 2.8 02/16/2022   VLDL 8 02/16/2022   LDLCALC 99 02/16/2022   LDLCALC 120 (H) 12/24/2021    Current Medications: Current Facility-Administered Medications  Medication Dose Route Frequency Provider Last Rate Last Admin   acetaminophen (TYLENOL) tablet 650 mg  650 mg Oral Q6H PRN [name], NP       alum & mag hydroxide-simeth (MAALOX/MYLANTA) 200-200-20 MG/5ML suspension 30 mL  30 mL Oral Q4H PRN [name], NP       gabapentin (NEURONTIN) capsule 300 mg  300 mg Oral TID [name], NP       magnesium hydroxide (MILK OF MAGNESIA) suspension 30 mL  30 mL Oral Daily PRN [name], NP       nicotine (NICODERM CQ - dosed in mg/24 hours) patch 14 mg  14 mg Transdermal Daily [name], NP       paliperidone (INVEGA) 24 hr tablet 6 mg  6 mg Oral Daily [name], NP       PTA Medications: Medications Prior to Admission  Medication Sig Dispense Refill Last Dose   oxyCODONE (ROXICODONE) 5 MG immediate release tablet Take 1 tablet (5 mg total) by mouth every 6 (six) hours as needed for severe pain. (Patient not taking: Reported on 09/04/2022) 8 tablet 0     Musculoskeletal: Strength & Muscle Tone: within normal limits Gait & Station: normal Patient leans: N/A  Psychiatric Specialty Exam: Physical Exam Vitals and nursing note reviewed.  Constitutional:      Appearance: Normal appearance.  HENT:     Head: Normocephalic.     Nose: Nose normal.  Pulmonary:     Effort: Pulmonary effort is normal.  Musculoskeletal:        General: Normal  range of motion.     Cervical back: Normal range of motion.  Neurological:     General: No focal deficit present.     Mental Status: He is alert and oriented to person, place, and time.  Psychiatric:        Attention and Perception: Attention and perception normal.        Mood and Affect: Mood is anxious and depressed.        Speech: Speech normal.        Behavior: Behavior normal. Behavior is cooperative.        Thought Content: Thought content includes suicidal ideation.        Cognition and Memory: Cognition and memory normal.        Judgment: Judgment normal.     Review of Systems  Musculoskeletal:        Leg pains  Psychiatric/Behavioral:  Positive for depression, substance abuse and suicidal ideas. The patient is nervous/anxious.   All other systems reviewed and are negative.   Blood pressure 119/78, pulse 88, temperature (!) 97.5 F (36.4 C), temperature source Oral, resp. rate 18, height 5\' 11"  (1.803 m), weight 78 kg, SpO2 100 %.Body mass index is 23.99 kg/m.  General Appearance: Disheveled   Eye Contact:  Good  Speech:  Clear and Coherent  Volume:  Normal  Mood:  Anxious and Hopeless  Affect:  Blunt  Thought Process:  Linear  Orientation:  Full (Time, Place, and Person)  Thought Content:  Paranoid Ideation  Suicidal Thoughts:  Yes.  with intent/plan  Homicidal Thoughts:  No  Memory:  Immediate;   Good Recent;   Good Remote;   Good  Judgement:  Impaired  Insight:  Fair  Psychomotor Activity:  Normal  Concentration:  Concentration: Fair and Attention Span: Fair  Recall:  Good  Fund of Knowledge:  Good  Language:  Good  Akathisia:  No  Handed:  Left  AIMS (if indicated):     Assets:  Housing  ADL's:  Intact  Cognition:  WNL  Sleep:  Number of Hours: 7      Physical Exam: Physical Exam Vitals and nursing note reviewed.  Constitutional:      Appearance: Normal appearance.  HENT:     Head: Normocephalic.     Nose: Nose normal.  Pulmonary:     Effort: Pulmonary effort is normal.  Musculoskeletal:        General: Normal range of motion.     Cervical back: Normal range of motion.  Neurological:     General: No focal deficit present.     Mental Status: He is alert and oriented to person, place, and time.  Psychiatric:        Attention and Perception: Attention and perception normal.        Mood and Affect: Mood is anxious and depressed.        Speech: Speech normal.        Behavior: Behavior normal. Behavior is cooperative.        Thought Content: Thought content includes suicidal ideation.        Cognition and Memory: Cognition and memory normal.        Judgment: Judgment normal.    Review of Systems  Musculoskeletal:        Leg pains  Psychiatric/Behavioral:  Positive for depression, substance abuse and suicidal ideas. The patient is nervous/anxious.   All other systems reviewed and are negative.  Blood pressure 119/78, pulse 88, temperature (!) 97.5 F (  36.4 C), temperature source Oral, resp. rate 18, height  (1.803 m), weight 78 kg, SpO2  100 %. Body mass index is 23.99 kg/m.  Treatment Plan Summary: Daily contact with patient to assess and evaluate symptoms and progress in treatment, Medication management, and Plan :   Cocaine and alcohol abuse with induced mood disorder: Gabapentin 300 mg TID   Schizophrenia: Invega 6 mg oral daily  Observation Level/Precautions:  15 minute checks  Laboratory:  Completed and reviewed in the ED  Psychotherapy:  individual and group therapy  Medications:  See above  Consultations:  None  Discharge Concerns:  None  Estimated LOS:  5-7 days  Other:     Physician Treatment Plan for Primary Diagnosis: Schizoaffective disorder, depressive type (HCC) Long Term Goal(s): Improvement in symptoms so as ready for discharge  Short Term Goals: Ability to identify changes in lifestyle to reduce recurrence of condition will improve, Ability to verbalize feelings will improve, Ability to disclose and discuss suicidal ideas, Ability to demonstrate self-control will improve, Ability to identify and develop effective coping behaviors will improve, Ability to maintain clinical measurements within normal limits will improve, Compliance with prescribed medications will improve, and Ability to identify triggers associated with substance abuse/mental health issues will improve  Physician Treatment Plan for Secondary Diagnosis: Principal Problem:   Schizoaffective disorder, depressive type (HCC)  Long Term Goal(s): Improvement in symptoms so as ready for discharge  Short Term Goals: Ability to identify changes in lifestyle to reduce recurrence of condition will improve, Ability to verbalize feelings will improve, Ability to disclose and discuss suicidal ideas, Ability to demonstrate self-control will improve, Ability to identify and develop effective coping behaviors will improve, Ability to maintain clinical measurements within normal limits will improve, Compliance with prescribed medications will improve,  and Ability to identify triggers associated with substance abuse/mental health issues will improve  I certify that inpatient services furnished can reasonably be expected to improve the patient's condition.    Nanine Means, NP 10/27/20239:15 AM

## 2022-09-08 NOTE — BH IP Treatment Plan (Signed)
Interdisciplinary Treatment and Diagnostic Plan Update  09/08/2022 Time of Session: 09:53 Dominic [name] MRN: [id number]  Principal Diagnosis: Schizoaffective disorder, depressive type ([place])  Secondary Diagnoses: Principal Problem:   Schizoaffective disorder, depressive type ([place])   Current Medications:  Current Facility-Administered Medications  Medication Dose Route Frequency Provider Last Rate Last Admin   acetaminophen (TYLENOL) tablet 650 mg  650 mg Oral Q6H PRN [name], NP   650 mg at 09/08/22 0932   alum & mag hydroxide-simeth (MAALOX/MYLANTA) 200-200-20 MG/5ML suspension 30 mL  30 mL Oral Q4H PRN [name], NP       feeding supplement (ENSURE ENLIVE / ENSURE PLUS) liquid 237 mL  237 mL Oral TID BM Clapacs, John T, MD   237 mL at 09/08/22 1052   gabapentin (NEURONTIN) capsule 300 mg  300 mg Oral TID [name], NP   300 mg at 09/08/22 1214   magnesium hydroxide (MILK OF MAGNESIA) suspension 30 mL  30 mL Oral Daily PRN [name], NP       multivitamin with minerals tablet 1 tablet  1 tablet Oral Daily Clapacs, [name], MD   1 tablet at 09/08/22 1052   nicotine (NICODERM CQ - dosed in mg/24 hours) patch 14 mg  14 mg Transdermal Daily [name], NP       paliperidone (INVEGA) 24 hr tablet 6 mg  6 mg Oral Daily [name], NP   6 mg at 09/08/22 0932   PTA Medications: Medications Prior to Admission  Medication Sig Dispense Refill Last Dose   oxyCODONE (ROXICODONE) 5 MG immediate release tablet Take 1 tablet (5 mg total) by mouth every 6 (six) hours as needed for severe pain. (Patient not taking: Reported on 09/04/2022) 8 tablet 0     Patient Stressors: Health problems   Substance abuse    Patient Strengths: Hydrographic surveyor for treatment/growth   Treatment Modalities: Medication Management, Group therapy, Case management,  1 to 1 session with clinician, Psychoeducation, Recreational therapy.   Physician Treatment Plan  for Primary Diagnosis: Schizoaffective disorder, depressive type ([place]) Long Term Goal(s): Improvement in symptoms so as ready for discharge   Short Term Goals: Ability to identify changes in lifestyle to reduce recurrence of condition will improve Ability to verbalize feelings will improve Ability to disclose and discuss suicidal ideas Ability to demonstrate self-control will improve Ability to identify and develop effective coping behaviors will improve Ability to maintain clinical measurements within normal limits will improve Compliance with prescribed medications will improve Ability to identify triggers associated with substance abuse/mental health issues will improve  Medication Management: Evaluate patient's response, side effects, and tolerance of medication regimen.  Therapeutic Interventions: 1 to 1 sessions, Unit Group sessions and Medication administration.  Evaluation of Outcomes: Not Met  Physician Treatment Plan for Secondary Diagnosis: Principal Problem:   Schizoaffective disorder, depressive type ([place])  Long Term Goal(s): Improvement in symptoms so as ready for discharge   Short Term Goals: Ability to identify changes in lifestyle to reduce recurrence of condition will improve Ability to verbalize feelings will improve Ability to disclose and discuss suicidal ideas Ability to demonstrate self-control will improve Ability to identify and develop effective coping behaviors will improve Ability to maintain clinical measurements within normal limits will improve Compliance with prescribed medications will improve Ability to identify triggers associated with substance abuse/mental health issues will improve     Medication Management: Evaluate patient's response, side effects, and tolerance of medication regimen.  Therapeutic Interventions: 1 to 1 sessions, Unit Group sessions and Medication administration.  Evaluation of Outcomes: Not Met   RN Treatment Plan for  Primary Diagnosis: Schizoaffective disorder, depressive type ([place]) Long Term Goal(s): Knowledge of disease and therapeutic regimen to maintain health will improve  Short Term Goals: Ability to remain free from injury will improve, Ability to verbalize frustration and anger appropriately will improve, Ability to demonstrate self-control, Ability to participate in decision making will improve, Ability to verbalize feelings will improve, Ability to disclose and discuss suicidal ideas, Ability to identify and develop effective coping behaviors will improve, and Compliance with prescribed medications will improve  Medication Management: RN will administer medications as ordered by provider, will assess and evaluate patient's response and provide education to patient for prescribed medication. RN will report any adverse and/or side effects to prescribing provider.  Therapeutic Interventions: 1 on 1 counseling sessions, Psychoeducation, Medication administration, Evaluate responses to treatment, Monitor vital signs and CBGs as ordered, Perform/monitor CIWA, COWS, AIMS and Fall Risk screenings as ordered, Perform wound care treatments as ordered.  Evaluation of Outcomes: Not Met   LCSW Treatment Plan for Primary Diagnosis: Schizoaffective disorder, depressive type ([place]) Long Term Goal(s): Safe transition to appropriate next level of care at discharge, Engage patient in therapeutic group addressing interpersonal concerns.  Short Term Goals: Engage patient in aftercare planning with referrals and resources, Increase social support, Increase ability to appropriately verbalize feelings, Increase emotional regulation, Facilitate acceptance of mental health diagnosis and concerns, Facilitate patient progression through stages of change regarding substance use diagnoses and concerns, Identify triggers associated with mental health/substance abuse issues, and Increase skills for wellness and recovery  Therapeutic  Interventions: Assess for all discharge needs, 1 to 1 time with Social worker, Explore available resources and support systems, Assess for adequacy in community support network, Educate family and significant other(s) on suicide prevention, Complete Psychosocial Assessment, Interpersonal group therapy.  Evaluation of Outcomes: Not Met   Progress in Treatment: Attending groups: No. Participating in groups: No. Taking medication as prescribed: Yes. Toleration medication: Yes. Family/Significant other contact made: No, will contact:  if given permission. Patient understands diagnosis: Yes. Discussing patient identified problems/goals with staff: Yes. Medical problems stabilized or resolved: Yes. Denies suicidal/homicidal ideation: Yes. Issues/concerns per patient self-inventory: No. Other: none.  New problem(s) identified: No, Describe:  none identified.  New Short Term/Long Term Goal(s): detox, elimination of symptoms of psychosis, medication management for mood stabilization; elimination of SI thoughts; development of comprehensive mental wellness/sobriety plan.  Patient Goals:  "Only thing I really need is to get back on my medications."  Discharge Plan or Barriers: CSW will assist pt with development of an appropriate aftercare/discharge plan.  Reason for Continuation of Hospitalization: Anxiety Depression Hallucinations Medication stabilization  Estimated Length of Stay: 1-7 days  Last 3 [place] Suicide Severity Risk Score: Flowsheet Row Admission (Current) from 09/07/2022 in [place] Most recent reading at 09/08/2022 12:12 AM ED from 09/07/2022 in [place] Most recent reading at 09/07/2022  4:26 AM ED from 08/12/2022 in [place] Most recent reading at 08/12/2022  4:31 PM  C-SSRS RISK CATEGORY Low Risk Error: Q7 should not be populated when Q6 is No Error: Q3, 4,  or 5 should not be populated when Q2 is No       Last PHQ 2/9 Scores:    04/19/2022    6:21 AM 02/16/2022    2:05 PM 08/03/2021    3:07  PM  Depression screen PHQ 2/9  Decreased Interest 2 2 0  Down, Depressed, Hopeless 3 3 0  PHQ - 2 Score 5 5 0  Altered sleeping 2 3   Tired, decreased energy 2 2   Change in appetite 2 3   Feeling bad or failure about yourself  3 3   Trouble concentrating 2 3   Moving slowly or fidgety/restless 1 3   Suicidal thoughts 2 2   PHQ-9 Score 19 24   Difficult doing work/chores Very difficult Extremely dIfficult     Scribe for Treatment Team: Shirl Harris, LCSW 09/08/2022 3:18 PM

## 2022-09-09 DIAGNOSIS — F251 Schizoaffective disorder, depressive type: Secondary | ICD-10-CM | POA: Diagnosis not present

## 2022-09-09 MED ORDER — TRAZODONE HCL 50 MG PO TABS
50.0000 mg | ORAL_TABLET | Freq: Every day | ORAL | Status: DC
  Administered 2022-09-09 – 2022-09-10 (×2): 50 mg via ORAL
  Filled 2022-09-09 (×2): qty 1

## 2022-09-09 NOTE — Plan of Care (Signed)
Pt denies anxiety/depression at this time. Pt denies SI/HI/AVH however, has 10/10 bilat leg pain at this time, prn medication given. Pt is calm and cooperative. Pt is medication compliant. Pt provided with support and encouragement. Pt monitored q15 minutes for safety per unit policy. Plan of care ongoing.    Problem: Education: Goal: Knowledge of General Education information will improve Description: Including pain rating scale, medication(s)/side effects and non-pharmacologic comfort measures Outcome: Progressing   Problem: Coping: Goal: Level of anxiety will decrease Outcome: Progressing

## 2022-09-09 NOTE — Progress Notes (Signed)
[facility] MD Progress Note  09/09/2022 12:13 PM Dominic [name]  MRN:  [id number] Subjective: Dominic [name] is seen on rounds.  I saw him in the emergency room originally.  He seems to be clearer since starting Invega.  He says that his pain is well-controlled for the most part.  He has to be placed on trazodone for sleep.  Principal Problem: Schizoaffective disorder, depressive type (HCC) Diagnosis: Principal Problem:   Schizoaffective disorder, depressive type (HCC)  Total Time spent with patient: 15 minutes  Past Psychiatric History: History of substance abuse and schizophrenia.  Past Medical History:  Past Medical History:  Diagnosis Date   Alcohol intoxication, uncomplicated (HCC) 01/13/2019   Anxiety    Asthma    Bipolar 1 disorder (HCC)    Chronic hepatitis C without hepatic coma (HCC) 05/28/2018   Depression    Hepatitis C    Hepatitis C antibody positive in blood 05/28/2018   Malingering 03/30/2022   Psychiatry note from [facility] states "directly admitted to fabricating SI and CAH to obtain [psychiatric] admission"   Schizophrenia (HCC)    Sleep apnea     Past Surgical History:  Procedure Laterality Date   CYST REMOVAL NECK     neck   INTRAMEDULLARY (IM) NAIL FIBULA Right 07/14/2022   Procedure: INTRAMEDULLARY (IM) NAIL FIBULA;  Surgeon: Ross Marcus, MD;  Location: [facility] ORS;  Service: Orthopedics;  Laterality: Right;   TIBIA IM NAIL INSERTION Right 07/14/2022   Procedure: INTRAMEDULLARY (IM) NAIL TIBIAL;  Surgeon: Ross Marcus, MD;  Location: [facility] ORS;  Service: Orthopedics;  Laterality: Right;   Family History:  Family History  Problem Relation Age of Onset   Hypertension Sister    Schizophrenia Sister     Social History:  Social History   Substance and Sexual Activity  Alcohol Use Yes   Alcohol/week: 3.0 standard drinks of alcohol   Types: 3 Shots of liquor per week     Social History   Substance and Sexual Activity  Drug Use Yes   Types: Cocaine, Marijuana,  Methamphetamines, Fentanyl   Comment: last night cocaine    Social History   Socioeconomic History   Marital status: Single    Spouse name: Not on file   Number of children: Not on file   Years of education: Not on file   Highest education level: Not on file  Occupational History   Not on file  Tobacco Use   Smoking status: Some Days    Packs/day: 1.00    Years: 10.00    Total pack years: 10.00    Types: Cigarettes   Smokeless tobacco: Never  Vaping Use   Vaping Use: Never used  Substance and Sexual Activity   Alcohol use: Yes    Alcohol/week: 3.0 standard drinks of alcohol    Types: 3 Shots of liquor per week   Drug use: Yes    Types: Cocaine, Marijuana, Methamphetamines, Fentanyl    Comment: last night cocaine   Sexual activity: Not Currently    Birth control/protection: Condom  Other Topics Concern   Not on file  Social History Narrative   Not on file   Social Determinants of Health   Financial Resource Strain: Low Risk  (11/25/2018)   Overall Financial Resource Strain (CARDIA)    Difficulty of Paying Living Expenses: Not very hard  Food Insecurity: Food Insecurity Present (09/08/2022)   Hunger Vital Sign    Worried About Running Out of Food in the Last Year: Often true    Ran  Out of Food in the Last Year: Often true  Transportation Needs: Unmet Transportation Needs (09/08/2022)   PRAPARE - Transportation    Lack of Transportation (Medical): Yes    Lack of Transportation (Non-Medical): Yes  Physical Activity: Inactive (11/25/2018)   Exercise Vital Sign    Days of Exercise per Week: 0 days    Minutes of Exercise per Session: 0 min  Stress: No Stress Concern Present (11/25/2018)   [place]    Feeling of Stress : Only a little  Social Connections: Unknown (11/25/2018)   Social Connection and Isolation Panel [NHANES]    Frequency of Communication with Friends and Family: Twice a week    Frequency  of Social Gatherings with Friends and Family: Twice a week    Attends Religious Services: Never    Marine scientist or Organizations: No    Attends Music therapist: Never    Marital Status: Patient refused   Additional Social History:                         Sleep: Fair  Appetite:  Fair  Current Medications: Current Facility-Administered Medications  Medication Dose Route Frequency Provider Last Rate Last Admin   acetaminophen (TYLENOL) tablet 650 mg  650 mg Oral Q6H PRN [name], NP   650 mg at 09/09/22 0751   alum & mag hydroxide-simeth (MAALOX/MYLANTA) 200-200-20 MG/5ML suspension 30 mL  30 mL Oral Q4H PRN [name], NP       feeding supplement (ENSURE ENLIVE / ENSURE PLUS) liquid 237 mL  237 mL Oral TID BM Clapacs, John T, MD   237 mL at 09/09/22 0933   gabapentin (NEURONTIN) capsule 300 mg  300 mg Oral TID [name], NP   300 mg at 09/09/22 1204   magnesium hydroxide (MILK OF MAGNESIA) suspension 30 mL  30 mL Oral Daily PRN [name], NP       multivitamin with minerals tablet 1 tablet  1 tablet Oral Daily Clapacs, [name], MD   1 tablet at 09/09/22 0750   nicotine (NICODERM CQ - dosed in mg/24 hours) patch 14 mg  14 mg Transdermal Daily [name], NP       paliperidone (INVEGA) 24 hr tablet 6 mg  6 mg Oral Daily [name], NP   6 mg at 09/09/22 0750   traZODone (DESYREL) tablet 50 mg  50 mg Oral QHS Parks Ranger, DO        Lab Results:  Results for orders placed or performed during the hospital encounter of 09/07/22 (from the past 48 hour(s))  Resp Panel by RT-PCR (Flu A&B, Covid) Anterior Nasal Swab     Status: None   Collection Time: 09/07/22 12:55 PM   Specimen: Anterior Nasal Swab  Result Value Ref Range   SARS Coronavirus 2 by RT PCR NEGATIVE NEGATIVE    Comment: (NOTE) SARS-CoV-2 target nucleic acids are NOT DETECTED.  The SARS-CoV-2 RNA is generally detectable in upper respiratory specimens  during the acute phase of infection. The lowest concentration of SARS-CoV-2 viral copies this assay can detect is 138 copies/mL. A negative result does not preclude SARS-Cov-2 infection and should not be used as the sole basis for treatment or other patient management decisions. A negative result may occur with  improper specimen collection/handling, submission of specimen other than nasopharyngeal swab, presence of viral mutation(s) within the  areas targeted by this assay, and inadequate number of viral copies(<138 copies/mL). A negative result must be combined with clinical observations, patient history, and epidemiological information. The expected result is Negative.  Fact Sheet for Patients:  BloggerCourse.com  Fact Sheet for Healthcare Providers:  SeriousBroker.it  This test is no t yet approved or cleared by the [place] FDA and  has been authorized for detection and/or diagnosis of SARS-CoV-2 by FDA under an Emergency Use Authorization (EUA). This EUA will remain  in effect (meaning this test can be used) for the duration of the COVID-19 declaration under Section 564(b)(1) of the Act, 21 U.S.C.section 360bbb-3(b)(1), unless the authorization is terminated  or revoked sooner.       Influenza A by PCR NEGATIVE NEGATIVE   Influenza B by PCR NEGATIVE NEGATIVE    Comment: (NOTE) The Xpert Xpress SARS-CoV-2/FLU/RSV plus assay is intended as an aid in the diagnosis of influenza from Nasopharyngeal swab specimens and should not be used as a sole basis for treatment. Nasal washings and aspirates are unacceptable for Xpert Xpress SARS-CoV-2/FLU/RSV testing.  Fact Sheet for Patients: BloggerCourse.com  Fact Sheet for Healthcare Providers: SeriousBroker.it  This test is not yet approved or cleared by the [place] FDA and has been authorized for detection and/or diagnosis  of SARS-CoV-2 by FDA under an Emergency Use Authorization (EUA). This EUA will remain in effect (meaning this test can be used) for the duration of the COVID-19 declaration under Section 564(b)(1) of the Act, 21 U.S.C. section 360bbb-3(b)(1), unless the authorization is terminated or revoked.  Performed at [facility], [address] [place], [place] [address]     Blood Alcohol level:  Lab Results  Component Value Date   ETH <10 09/07/2022   ETH 15 (H) 09/04/2022    Metabolic Disorder Labs: Lab Results  Component Value Date   HGBA1C 5.0 02/16/2022   MPG 96.8 02/16/2022   MPG 96.8 12/24/2021   No results found for: "PROLACTIN" Lab Results  Component Value Date   CHOL 167 02/16/2022   TRIG 40 02/16/2022   HDL 60 02/16/2022   CHOLHDL 2.8 02/16/2022   VLDL 8 02/16/2022   LDLCALC 99 02/16/2022   LDLCALC 120 (H) 12/24/2021    Physical Findings: AIMS: Facial and Oral Movements Muscles of Facial Expression: None, normal Lips and Perioral Area: None, normal Jaw: None, normal Tongue: None, normal,Extremity Movements Upper (arms, wrists, hands, fingers): None, normal Lower (legs, knees, ankles, toes): None, normal, Trunk Movements Neck, shoulders, hips: None, normal, Overall Severity Severity of abnormal movements (highest score from questions above): None, normal Incapacitation due to abnormal movements: None, normal Patient's awareness of abnormal movements (rate only patient's report): No Awareness, Dental Status Current problems with teeth and/or dentures?: No Does patient usually wear dentures?: No  CIWA:    COWS:     Musculoskeletal: Strength & Muscle Tone: within normal limits Gait & Station: normal Patient leans: N/A  Psychiatric Specialty Exam:  Presentation  General Appearance:  Appropriate for Environment  Eye Contact: Good  Speech: Clear and Coherent  Speech Volume: Normal  Handedness: Right   Mood and Affect   Mood: Euthymic  Affect: Congruent   Thought Process  Thought Processes: Coherent  Descriptions of Associations:Intact  Orientation:Full (Time, Place and Person)  Thought Content:Logical  History of Schizophrenia/Schizoaffective disorder:Yes  Duration of Psychotic Symptoms:Greater than six months  Hallucinations:No data recorded Ideas of Reference:None  Suicidal Thoughts:No data recorded Homicidal Thoughts:No data recorded  Sensorium  Memory: Immediate Good; Remote  Good; Recent Good  Judgment: Fair  Insight: Poor   Executive Functions  Concentration: Fair  Attention Span: Fair  Recall: [employer] of Knowledge: Fair  Language: Fair   Psychomotor Activity  Psychomotor Activity:No data recorded  Assets  Assets: Communication Skills; Desire for Improvement; Housing; Health and safety inspector; Physical Health; Resilience; Social Support   Sleep  Sleep:No data recorded    Blood pressure 122/87, pulse 72, temperature 97.6 F (36.4 C), temperature source Oral, resp. rate 18, height 5\' 11"  (1.803 m), weight 78 kg, SpO2 99 %. Body mass index is 23.99 kg/m.   Treatment Plan Summary: Daily contact with patient to assess and evaluate symptoms and progress in treatment, Medication management, and Plan to start 50 mg of trazodone at bedtime.  , DO 09/09/2022, 12:13 PM

## 2022-09-09 NOTE — Progress Notes (Signed)
Patient alert and oriented x 4, denies SI/HI/AVH. Patient's thoughts are organized and speech is coherent no distress noted he was visible in the milieu interacting with peers and staff. 15 minutes safety checks maintained will continue to monitor

## 2022-09-09 NOTE — Progress Notes (Signed)
Pt denies SI/HI/AVH and verbally agrees to approach staff if these become apparent or before harming themselves/others. Rates depression 10/10. Rates anxiety 10/10. Rates pain 10/10. Pt has been in his room for most of the day. Pt is pleasant and WDL. Pts only complaint is his pain. Scheduled medications administered to pt, per MD orders. RN provided support and encouragement to pt. Q15 min safety checks implemented and continued. Pt safe on the unit. RN will continue to monitor and intervene as needed.  Problem: Activity: Goal: Risk for activity intolerance will decrease Outcome: Progressing   Problem: Coping: Goal: Level of anxiety will decrease Outcome: Progressing    09/09/22 0751  Psych Admission Type (Psych Patients Only)  Admission Status Voluntary  Psychosocial Assessment  Patient Complaints None  Eye Contact Fair  Facial Expression Other (Comment) (WDL)  Affect Appropriate to circumstance  Speech Logical/coherent  Interaction Assertive  Motor Activity Slow  Appearance/Hygiene In scrubs  Behavior Characteristics Cooperative;Appropriate to situation;Calm  Mood Pleasant  Thought Process  Coherency WDL  Content WDL  Delusions None reported or observed  Perception WDL  Hallucination None reported or observed  Judgment WDL  Confusion None  Danger to Self  Current suicidal ideation? Denies  Danger to Others  Danger to Others None reported or observed

## 2022-09-10 DIAGNOSIS — F251 Schizoaffective disorder, depressive type: Secondary | ICD-10-CM | POA: Diagnosis not present

## 2022-09-10 NOTE — Progress Notes (Signed)
[facility] MD Progress Note  09/10/2022 11:48 AM Dominic [name]  MRN:  [id number] Subjective: Dominic [name] is seen on rounds.  He is in a very good mood.  He asks when he can go home.  He has been compliant with his medications.  No withdrawal symptoms.  No complaints.  Mood and affect are stable.  Principal Problem: Schizoaffective disorder, depressive type (HCC) Diagnosis: Principal Problem:   Schizoaffective disorder, depressive type (HCC)  Total Time spent with patient: 15 minutes  Past Psychiatric History: History of substance abuse and schizophrenia.  Past Medical History:  Past Medical History:  Diagnosis Date   Alcohol intoxication, uncomplicated (HCC) 01/13/2019   Anxiety    Asthma    Bipolar 1 disorder (HCC)    Chronic hepatitis C without hepatic coma (HCC) 05/28/2018   Depression    Hepatitis C    Hepatitis C antibody positive in blood 05/28/2018   Malingering 03/30/2022   Psychiatry note from [facility] states "directly admitted to fabricating SI and CAH to obtain [psychiatric] admission"   Schizophrenia (HCC)    Sleep apnea     Past Surgical History:  Procedure Laterality Date   CYST REMOVAL NECK     neck   INTRAMEDULLARY (IM) NAIL FIBULA Right 07/14/2022   Procedure: INTRAMEDULLARY (IM) NAIL FIBULA;  Surgeon: Ross Marcus, MD;  Location: [facility] ORS;  Service: Orthopedics;  Laterality: Right;   TIBIA IM NAIL INSERTION Right 07/14/2022   Procedure: INTRAMEDULLARY (IM) NAIL TIBIAL;  Surgeon: Ross Marcus, MD;  Location: [facility] ORS;  Service: Orthopedics;  Laterality: Right;   Family History:  Family History  Problem Relation Age of Onset   Hypertension Sister    Schizophrenia Sister     Social History:  Social History   Substance and Sexual Activity  Alcohol Use Yes   Alcohol/week: 3.0 standard drinks of alcohol   Types: 3 Shots of liquor per week     Social History   Substance and Sexual Activity  Drug Use Yes   Types: Cocaine, Marijuana, Methamphetamines,  Fentanyl   Comment: last night cocaine    Social History   Socioeconomic History   Marital status: Single    Spouse name: Not on file   Number of children: Not on file   Years of education: Not on file   Highest education level: Not on file  Occupational History   Not on file  Tobacco Use   Smoking status: Some Days    Packs/day: 1.00    Years: 10.00    Total pack years: 10.00    Types: Cigarettes   Smokeless tobacco: Never  Vaping Use   Vaping Use: Never used  Substance and Sexual Activity   Alcohol use: Yes    Alcohol/week: 3.0 standard drinks of alcohol    Types: 3 Shots of liquor per week   Drug use: Yes    Types: Cocaine, Marijuana, Methamphetamines, Fentanyl    Comment: last night cocaine   Sexual activity: Not Currently    Birth control/protection: Condom  Other Topics Concern   Not on file  Social History Narrative   Not on file   Social Determinants of Health   Financial Resource Strain: Low Risk  (11/25/2018)   Overall Financial Resource Strain (CARDIA)    Difficulty of Paying Living Expenses: Not very hard  Food Insecurity: Food Insecurity Present (09/08/2022)   Hunger Vital Sign    Worried About Running Out of Food in the Last Year: Often true    Ran Out of Food  in the Last Year: Often true  Transportation Needs: Unmet Transportation Needs (09/08/2022)   PRAPARE - Transportation    Lack of Transportation (Medical): Yes    Lack of Transportation (Non-Medical): Yes  Physical Activity: Inactive (11/25/2018)   Exercise Vital Sign    Days of Exercise per Week: 0 days    Minutes of Exercise per Session: 0 min  Stress: No Stress Concern Present (11/25/2018)   [place]    Feeling of Stress : Only a little  Social Connections: Unknown (11/25/2018)   Social Connection and Isolation Panel [NHANES]    Frequency of Communication with Friends and Family: Twice a week    Frequency of Social  Gatherings with Friends and Family: Twice a week    Attends Religious Services: Never    Marine scientist or Organizations: No    Attends Music therapist: Never    Marital Status: Patient refused   Additional Social History:                         Sleep: Good  Appetite:  Good  Current Medications: Current Facility-Administered Medications  Medication Dose Route Frequency Provider Last Rate Last Admin   acetaminophen (TYLENOL) tablet 650 mg  650 mg Oral Q6H PRN [name], NP   650 mg at 09/09/22 2103   alum & mag hydroxide-simeth (MAALOX/MYLANTA) 200-200-20 MG/5ML suspension 30 mL  30 mL Oral Q4H PRN [name], NP       feeding supplement (ENSURE ENLIVE / ENSURE PLUS) liquid 237 mL  237 mL Oral TID BM Clapacs, John T, MD   237 mL at 09/10/22 1005   gabapentin (NEURONTIN) capsule 300 mg  300 mg Oral TID [name], NP   300 mg at 09/10/22 1148   magnesium hydroxide (MILK OF MAGNESIA) suspension 30 mL  30 mL Oral Daily PRN [name], NP       multivitamin with minerals tablet 1 tablet  1 tablet Oral Daily Clapacs, [name], MD   1 tablet at 09/10/22 0819   nicotine (NICODERM CQ - dosed in mg/24 hours) patch 14 mg  14 mg Transdermal Daily [name], NP       paliperidone (INVEGA) 24 hr tablet 6 mg  6 mg Oral Daily [name], NP   6 mg at 09/10/22 0092   traZODone (DESYREL) tablet 50 mg  50 mg Oral QHS Parks Ranger, DO   50 mg at 09/09/22 2103    Lab Results: No results found for this or any previous visit (from the past 48 hour(s)).  Blood Alcohol level:  Lab Results  Component Value Date   ETH <10 09/07/2022   ETH 15 (H) 33/00/7622    Metabolic Disorder Labs: Lab Results  Component Value Date   HGBA1C 5.0 02/16/2022   MPG 96.8 02/16/2022   MPG 96.8 12/24/2021   No results found for: "PROLACTIN" Lab Results  Component Value Date   CHOL 167 02/16/2022   TRIG 40 02/16/2022   HDL 60 02/16/2022    CHOLHDL 2.8 02/16/2022   VLDL 8 02/16/2022   LDLCALC 99 02/16/2022   LDLCALC 120 (H) 12/24/2021    Physical Findings: AIMS: Facial and Oral Movements Muscles of Facial Expression: None, normal Lips and Perioral Area: None, normal Jaw: None, normal Tongue: None, normal,Extremity Movements Upper (arms, wrists, hands, fingers): None, normal Lower (legs, knees, ankles, toes):  None, normal, Trunk Movements Neck, shoulders, hips: None, normal, Overall Severity Severity of abnormal movements (highest score from questions above): None, normal Incapacitation due to abnormal movements: None, normal Patient's awareness of abnormal movements (rate only patient's report): No Awareness, Dental Status Current problems with teeth and/or dentures?: No Does patient usually wear dentures?: No  CIWA:    COWS:     Musculoskeletal: Strength & Muscle Tone: within normal limits Gait & Station: normal Patient leans: N/A  Psychiatric Specialty Exam:  Presentation  General Appearance:  Appropriate for Environment  Eye Contact: Good  Speech: Clear and Coherent  Speech Volume: Normal  Handedness: Right   Mood and Affect  Mood: Euthymic  Affect: Congruent   Thought Process  Thought Processes: Coherent  Descriptions of Associations:Intact  Orientation:Full (Time, Place and Person)  Thought Content:Logical  History of Schizophrenia/Schizoaffective disorder:Yes  Duration of Psychotic Symptoms:Greater than six months  Hallucinations:No data recorded Ideas of Reference:None  Suicidal Thoughts:No data recorded Homicidal Thoughts:No data recorded  Sensorium  Memory: Immediate Good; Remote Good; Recent Good  Judgment: Fair  Insight: Poor   Executive Functions  Concentration: Fair  Attention Span: Fair  Recall: [employer] of Knowledge: Fair  Language: Fair   Psychomotor Activity  Psychomotor Activity:No data recorded  Assets  Assets: Communication  Skills; Desire for Improvement; Housing; Health and safety inspector; Physical Health; Resilience; Social Support   Sleep  Sleep:No data recorded    Blood pressure (!) 143/88, pulse 76, temperature 97.9 F (36.6 C), temperature source Oral, resp. rate 18, height 5\' 11"  (1.803 m), weight 78 kg, SpO2 98 %. Body mass index is 23.99 kg/m.   Treatment Plan Summary: Daily contact with patient to assess and evaluate symptoms and progress in treatment, Medication management, and Plan continue current medications.  , DO 09/10/2022, 11:48 AM

## 2022-09-10 NOTE — Group Note (Signed)
LCSW Group Therapy Note   Group Date: 09/10/2022 Start Time: 1300 End Time: 1400   Type of Therapy and Topic:  Group Therapy: Boundaries  Participation Level:  Did Not Attend  Description of Group: This group will address the use of boundaries in their personal lives. Patients will explore why boundaries are important, the difference between healthy and unhealthy boundaries, and negative and postive outcomes of different boundaries and will look at how boundaries can be crossed.  Patients will be encouraged to identify current boundaries in their own lives and identify what kind of boundary is being set. Facilitators will guide patients in utilizing problem-solving interventions to address and correct types boundaries being used and to address when no boundary is being used. Understanding and applying boundaries will be explored and addressed for obtaining and maintaining a balanced life. Patients will be encouraged to explore ways to assertively make their boundaries and needs known to significant others in their lives, using other group members and facilitator for role play, support, and feedback.  Therapeutic Goals:  1.  Patient will identify areas in their life where setting clear boundaries could be  used to improve their life.  2.  Patient will identify signs/triggers that a boundary is not being respected. 3.  Patient will identify two ways to set boundaries in order to achieve balance in  their lives: 4.  Patient will demonstrate ability to communicate their needs and set boundaries  through discussion and/or role plays  Summary of Patient Progress:   Patient sat for group, however stated that he needed to go to the restroom, patient did not return.   Therapeutic Modalities:   Cognitive Behavioral Therapy Solution-Focused Therapy  Rozann Lesches, LCSWA 09/10/2022  3:03 PM

## 2022-09-10 NOTE — Plan of Care (Signed)
D: Patient alert and oriented. Patient rates pain 10/10 for leg pain. Patient denies anxiety and depression. Patient denies SI/HI/AVH.  Patient has been isolative to room during majority of shift with exception to coming out for meals and medication. Patient also observed making phone calls throughout shift.   A: Scheduled medications administered to patient, per MD orders.  Support and encouragement provided to patient.  Q15 minute safety checks maintained.   R: Patient compliant with medication administration and treatment plan. No adverse drug reactions noted. Patient remains safe on the unit at this time.  Problem: Education: Goal: Knowledge of [place] [facility] Education information/materials will improve Outcome: Progressing Goal: Mental status will improve Outcome: Progressing Goal: Verbalization of understanding the information provided will improve Outcome: Progressing   Problem: Health Behavior/Discharge Planning: Goal: Compliance with treatment plan for underlying cause of condition will improve Outcome: Progressing   Problem: Physical Regulation: Goal: Ability to maintain clinical measurements within normal limits will improve Outcome: Progressing   Problem: Safety: Goal: Periods of time without injury will increase Outcome: Progressing   Problem: Self-Concept: Goal: Level of anxiety will decrease Outcome: Progressing

## 2022-09-11 DIAGNOSIS — F251 Schizoaffective disorder, depressive type: Secondary | ICD-10-CM | POA: Diagnosis not present

## 2022-09-11 MED ORDER — PALIPERIDONE ER 6 MG PO TB24: 6.0000 mg | ORAL_TABLET | Freq: Every day | ORAL | 0 refills | Status: DC

## 2022-09-11 MED ORDER — TRAZODONE HCL 50 MG PO TABS: 50.0000 mg | ORAL_TABLET | Freq: Every day | ORAL | 0 refills | Status: DC

## 2022-09-11 NOTE — BHH Suicide Risk Assessment (Signed)
[facility] Discharge Suicide Risk Assessment   Principal Problem: Schizoaffective disorder, depressive type [facility]) Discharge Diagnoses: Principal Problem:   Schizoaffective disorder, depressive type (Dominic [name])  46 yo male presented with cocaine abuse and depression.  He started medications and detoxed, no withdrawal symptoms.  No suicidal/homicidal ideations, hallucinations.  He wants to follow up with [facility].  Total Time spent with patient: 45 minutes  Musculoskeletal: Strength & Muscle Tone: within normal limits Gait & Station: normal Patient leans: N/A  Psychiatric Specialty Exam: Physical Exam Vitals and nursing note reviewed.  Constitutional:      Appearance: Normal appearance.  HENT:     Head: Normocephalic.     Nose: Nose normal.  Pulmonary:     Effort: Pulmonary effort is normal.  Musculoskeletal:        General: Normal range of motion.     Cervical back: Normal range of motion.  Neurological:     General: No focal deficit present.     Mental Status: He is alert and oriented to person, place, and time.  Psychiatric:        Attention and Perception: Attention and perception normal.        Mood and Affect: Mood is anxious.        Speech: Speech normal.        Behavior: Behavior normal. Behavior is cooperative.        Thought Content: Thought content normal.        Cognition and Memory: Cognition and memory normal.        Judgment: Judgment normal.     Review of Systems  Musculoskeletal:        Leg pains  Psychiatric/Behavioral:  Positive for substance abuse. The patient is nervous/anxious.   All other systems reviewed and are negative.   Blood pressure (!) 141/94, pulse 92, temperature 98.3 F (36.8 C), temperature source Oral, resp. rate 18, height 5\' 11"  (1.803 m), weight 78 kg, SpO2 97 %.Body mass index is 23.99 kg/m.  General Appearance: Casual  Eye Contact:  Good  Speech:  Normal Rate  Volume:  Normal  Mood:  Anxious  Affect:  Congruent  Thought Process:  Coherent   Orientation:  Full (Time, Place, and Person)  Thought Content:  WDL and Logical  Suicidal Thoughts:  No  Homicidal Thoughts:  No  Memory:  Immediate;   Good Recent;   Good Remote;   Good  Judgement:  Fair  Insight:  Fair  Psychomotor Activity:  Normal  Concentration:  Concentration: Good and Attention Span: Good  Recall:  Good  Fund of Knowledge:  Good  Language:  Good  Akathisia:  No  Handed:  Right  AIMS (if indicated):     Assets:  Housing Leisure Time Physical Health Resilience Social Support  ADL's:  Intact  Cognition:  WNL  Sleep:  Number of Hours: 8     Physical Exam: Physical Exam Vitals and nursing note reviewed.  Constitutional:      Appearance: Normal appearance.  HENT:     Head: Normocephalic.     Nose: Nose normal.  Pulmonary:     Effort: Pulmonary effort is normal.  Musculoskeletal:        General: Normal range of motion.     Cervical back: Normal range of motion.  Neurological:     General: No focal deficit present.     Mental Status: He is alert and oriented to person, place, and time.  Psychiatric:        Attention and  Perception: Attention and perception normal.        Mood and Affect: Mood is anxious.        Speech: Speech normal.        Behavior: Behavior normal. Behavior is cooperative.        Thought Content: Thought content normal.        Cognition and Memory: Cognition and memory normal.        Judgment: Judgment normal.    Review of Systems  Musculoskeletal:        Leg pains  Psychiatric/Behavioral:  Positive for substance abuse. The patient is nervous/anxious.   All other systems reviewed and are negative.  Blood pressure (!) 141/94, pulse 92, temperature 98.3 F (36.8 C), temperature source Oral, resp. rate 18, height 5\' 11"  (1.803 m), weight 78 kg, SpO2 97 %. Body mass index is 23.99 kg/m.  Mental Status Per Nursing Assessment::   On Admission:  Self-harm thoughts  Demographic Factors:  Male  Loss  Factors: NA  Historical Factors: NA  Risk Reduction Factors:   Responsible for children under 55 years of age, Sense of responsibility to family, Living with another person, especially a relative, and Positive social support  Continued Clinical Symptoms:  Anxiety, mild  Cognitive Features That Contribute To Risk:  None    Suicide Risk:  Minimal: No identifiable suicidal ideation.  Patients presenting with no risk factors but with morbid ruminations; may be classified as minimal risk based on the severity of the depressive symptoms   Follow-up Information     [place] Follow up.   Why: Your appointment is scheduled for Wednesday, 09/13/22 at 11AM. Thanks! 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

## 2022-09-11 NOTE — Plan of Care (Signed)
  Problem: Education: Goal: Knowledge of General Education information will improve Description: Including pain rating scale, medication(s)/side effects and non-pharmacologic comfort measures Outcome: Adequate for Discharge   Problem: Health Behavior/Discharge Planning: Goal: Ability to manage health-related needs will improve Outcome: Adequate for Discharge   Problem: Clinical Measurements: Goal: Ability to maintain clinical measurements within normal limits will improve Outcome: Adequate for Discharge Goal: Will remain free from infection Outcome: Adequate for Discharge Goal: Diagnostic test results will improve Outcome: Adequate for Discharge Goal: Respiratory complications will improve Outcome: Adequate for Discharge Goal: Cardiovascular complication will be avoided Outcome: Adequate for Discharge   Problem: Activity: Goal: Risk for activity intolerance will decrease Outcome: Adequate for Discharge   Problem: Nutrition: Goal: Adequate nutrition will be maintained Outcome: Adequate for Discharge   Problem: Coping: Goal: Level of anxiety will decrease Outcome: Adequate for Discharge   Problem: Elimination: Goal: Will not experience complications related to bowel motility Outcome: Adequate for Discharge Goal: Will not experience complications related to urinary retention Outcome: Adequate for Discharge   Problem: Pain Managment: Goal: General experience of comfort will improve Outcome: Adequate for Discharge   Problem: Safety: Goal: Ability to remain free from injury will improve Outcome: Adequate for Discharge   Problem: Skin Integrity: Goal: Risk for impaired skin integrity will decrease Outcome: Adequate for Discharge   Problem: Education: Goal: Knowledge of [place] [facility] Education information/materials will improve Outcome: Adequate for Discharge Goal: Emotional status will improve Outcome: Adequate for Discharge Goal: Mental status will  improve Outcome: Adequate for Discharge Goal: Verbalization of understanding the information provided will improve Outcome: Adequate for Discharge   Problem: Activity: Goal: Interest or engagement in activities will improve Outcome: Adequate for Discharge Goal: Sleeping patterns will improve Outcome: Adequate for Discharge   Problem: Coping: Goal: Ability to verbalize frustrations and anger appropriately will improve Outcome: Adequate for Discharge Goal: Ability to demonstrate self-control will improve Outcome: Adequate for Discharge   Problem: Health Behavior/Discharge Planning: Goal: Identification of resources available to assist in meeting health care needs will improve Outcome: Adequate for Discharge Goal: Compliance with treatment plan for underlying cause of condition will improve Outcome: Adequate for Discharge   Problem: Physical Regulation: Goal: Ability to maintain clinical measurements within normal limits will improve Outcome: Adequate for Discharge   Problem: Safety: Goal: Periods of time without injury will increase Outcome: Adequate for Discharge   Problem: Education: Goal: Ability to state activities that reduce stress will improve Outcome: Adequate for Discharge   Problem: Coping: Goal: Ability to identify and develop effective coping behavior will improve Outcome: Adequate for Discharge   Problem: Self-Concept: Goal: Ability to identify factors that promote anxiety will improve Outcome: Adequate for Discharge Goal: Level of anxiety will decrease Outcome: Adequate for Discharge Goal: Ability to modify response to factors that promote anxiety will improve Outcome: Adequate for Discharge

## 2022-09-11 NOTE — Discharge Summary (Signed)
Physician Discharge Summary Note  Patient:  Dominic [name] is an 46 y.o., male MRN:  [id number] DOB:  04/24/1976 Patient phone:  [phone] (home)  Patient address:   [place] [address],  Total Time spent with patient: 45 minutes  Date of Admission:  09/07/2022 Date of Discharge: 09/11/2022  Reason for Admission:  cocaine abuse and depression  Principal Problem: Schizoaffective disorder, depressive type [facility]) Discharge Diagnoses: Principal Problem:   Schizoaffective disorder, depressive type ([place])   Past Psychiatric History: polysubstance use disorder, schizoaffective d/o  Past Medical History:  Past Medical History:  Diagnosis Date   Alcohol intoxication, uncomplicated ([place]) 02/16/5034   Anxiety    Asthma    Bipolar 1 disorder ([place])    Chronic hepatitis C without hepatic coma ([place]) 05/28/2018   Depression    Hepatitis C    Hepatitis C antibody positive in blood 05/28/2018   Malingering 03/30/2022   Psychiatry note from [facility] states "directly admitted to fabricating SI and CAH to obtain [psychiatric] admission"   Schizophrenia ([place])    Sleep apnea     Past Surgical History:  Procedure Laterality Date   CYST REMOVAL NECK     neck   INTRAMEDULLARY (IM) NAIL FIBULA Right 07/14/2022   Procedure: INTRAMEDULLARY (IM) NAIL FIBULA;  Surgeon: Renee Harder, MD;  Location: [facility] ORS;  Service: Orthopedics;  Laterality: Right;   TIBIA IM NAIL INSERTION Right 07/14/2022   Procedure: INTRAMEDULLARY (IM) NAIL TIBIAL;  Surgeon: Renee Harder, MD;  Location: [facility] ORS;  Service: Orthopedics;  Laterality: Right;   Family History:  Family History  Problem Relation Age of Onset   Hypertension Sister    Schizophrenia Sister    Family Psychiatric  History: see above Social History:  Social History   Substance and Sexual Activity  Alcohol Use Yes   Alcohol/week: 3.0 standard drinks of alcohol   Types: 3 Shots of liquor per week     Social History   Substance  and Sexual Activity  Drug Use Yes   Types: Cocaine, Marijuana, Methamphetamines, Fentanyl   Comment: last night cocaine    Social History   Socioeconomic History   Marital status: Single    Spouse name: Not on file   Number of children: Not on file   Years of education: Not on file   Highest education level: Not on file  Occupational History   Not on file  Tobacco Use   Smoking status: Some Days    Packs/day: 1.00    Years: 10.00    Total pack years: 10.00    Types: Cigarettes   Smokeless tobacco: Never  Vaping Use   Vaping Use: Never used  Substance and Sexual Activity   Alcohol use: Yes    Alcohol/week: 3.0 standard drinks of alcohol    Types: 3 Shots of liquor per week   Drug use: Yes    Types: Cocaine, Marijuana, Methamphetamines, Fentanyl    Comment: last night cocaine   Sexual activity: Not Currently    Birth control/protection: Condom  Other Topics Concern   Not on file  Social History Narrative   Not on file   Social Determinants of Health   Financial Resource Strain: Low Risk  (11/25/2018)   Overall Financial Resource Strain (CARDIA)    Difficulty of Paying Living Expenses: Not very hard  Food Insecurity: Food Insecurity Present (09/08/2022)   Hunger Vital Sign    Worried About Running Out of Food in the Last Year: Often true  Ran Out of Food in the Last Year: Often true  Transportation Needs: Unmet Transportation Needs (09/08/2022)   PRAPARE - Hydrologist (Medical): Yes    Lack of Transportation (Non-Medical): Yes  Physical Activity: Inactive (11/25/2018)   Exercise Vital Sign    Days of Exercise per Week: 0 days    Minutes of Exercise per Session: 0 min  Stress: No Stress Concern Present (11/25/2018)   [place]    Feeling of Stress : Only a little  Social Connections: Unknown (11/25/2018)   Social Connection and Isolation Panel [NHANES]    Frequency of  Communication with Friends and Family: Twice a week    Frequency of Social Gatherings with Friends and Family: Twice a week    Attends Religious Services: Never    Marine scientist or Organizations: No    Attends Archivist Meetings: Never    Marital Status: Patient refused    Hospital Course:   On admission he wanted rehab for cocaine abuse along with alcohol, cannabis, and fentanyl.  He was depressed and suicidal.  He was also hearing voices.  Medications started and he stabilized.  Encouragement from the team to go to rehab, client declined.  He wants to follow up with [facility].    Client has met maximum capacity for hospitalization.  Denies suicidal/homicidal ideations, hallucinations, or withdrawal symptoms.  Discharge instructions provided with explanations along with crisis numbers, Rx, and follow up appointment.  Physical Findings: AIMS: Facial and Oral Movements Muscles of Facial Expression: None, normal Lips and Perioral Area: None, normal Jaw: None, normal Tongue: None, normal,Extremity Movements Upper (arms, wrists, hands, fingers): None, normal Lower (legs, knees, ankles, toes): None, normal, Trunk Movements Neck, shoulders, hips: None, normal, Overall Severity Severity of abnormal movements (highest score from questions above): None, normal Incapacitation due to abnormal movements: None, normal Patient's awareness of abnormal movements (rate only patient's report): No Awareness, Dental Status Current problems with teeth and/or dentures?: No Does patient usually wear dentures?: No  CIWA:    COWS:      Musculoskeletal: Strength & Muscle Tone: within normal limits Gait & Station: normal Patient leans: N/A  Psychiatric Specialty Exam: Physical Exam Vitals and nursing note reviewed.  Constitutional:      Appearance: Normal appearance.  HENT:     Head: Normocephalic.     Nose: Nose normal.  Pulmonary:     Effort: Pulmonary effort is normal.   Musculoskeletal:        General: Normal range of motion.     Cervical back: Normal range of motion.  Neurological:     General: No focal deficit present.     Mental Status: He is alert and oriented to person, place, and time.  Psychiatric:        Attention and Perception: Attention and perception normal.        Mood and Affect: Mood is anxious.        Speech: Speech normal.        Behavior: Behavior normal. Behavior is cooperative.        Thought Content: Thought content normal.        Cognition and Memory: Cognition and memory normal.        Judgment: Judgment normal.     Review of Systems  Musculoskeletal:        Leg pains  Psychiatric/Behavioral:  Positive for substance abuse. The patient is nervous/anxious.   All  other systems reviewed and are negative.   Blood pressure (!) 141/94, pulse 92, temperature 98.3 F (36.8 C), temperature source Oral, resp. rate 18, height _0  (1.803 m), weight 78 kg, SpO2 97 %.Body mass index is 23.99 kg/m.  General Appearance: Casual  Eye Contact:  Good  Speech:  Normal Rate  Volume:  Normal  Mood:  Anxious  Affect:  Congruent  Thought Process:  Coherent  Orientation:  Full (Time, Place, and Person)  Thought Content:  WDL and Logical  Suicidal Thoughts:  No  Homicidal Thoughts:  No  Memory:  Immediate;   Good Recent;   Good Remote;   Good  Judgement:  Fair  Insight:  Fair  Psychomotor Activity:  Normal  Concentration:  Concentration: Good and Attention Span: Good  Recall:  Good  Fund of Knowledge:  Good  Language:  Good  Akathisia:  No  Handed:  Right  AIMS (if indicated):     Assets:  Housing Leisure Time Physical Health Resilience Social Support  ADL's:  Intact  Cognition:  WNL  Sleep:  Number of Hours: 8     Physical Exam: Physical Exam Vitals and nursing note reviewed.  Constitutional:      Appearance: Normal appearance.  HENT:     Head: Normocephalic.     Nose: Nose normal.  Pulmonary:     Effort:  Pulmonary effort is normal.  Musculoskeletal:        General: Normal range of motion.     Cervical back: Normal range of motion.  Neurological:     General: No focal deficit present.     Mental Status: He is alert and oriented to person, place, and time.  Psychiatric:        Attention and Perception: Attention and perception normal.        Mood and Affect: Mood is anxious.        Speech: Speech normal.        Behavior: Behavior normal. Behavior is cooperative.        Thought Content: Thought content normal.        Cognition and Memory: Cognition and memory normal.        Judgment: Judgment normal.    Review of Systems  Psychiatric/Behavioral:  Positive for substance abuse. The patient is nervous/anxious.   All other systems reviewed and are negative.  Blood pressure (!) 141/94, pulse 92, temperature 98.3 F (36.8 C), temperature source Oral, resp. rate 18, height _1  (1.803 m), weight 78 kg, SpO2 97 %. Body mass index is 23.99 kg/m.   Social History   Tobacco Use  Smoking Status Some Days   Packs/day: 1.00   Years: 10.00   Total pack years: 10.00   Types: Cigarettes  Smokeless Tobacco Never   Tobacco Cessation:  A prescription for an FDA-approved tobacco cessation medication was offered at discharge and the patient refused   Blood Alcohol level:  Lab Results  Component Value Date   ETH <10 09/07/2022   ETH 15 (H) 25/03/3975    Metabolic Disorder Labs:  Lab Results  Component Value Date   HGBA1C 5.0 02/16/2022   MPG 96.8 02/16/2022   MPG 96.8 12/24/2021   No results found for: "PROLACTIN" Lab Results  Component Value Date   CHOL 167 02/16/2022   TRIG 40 02/16/2022   HDL 60 02/16/2022   CHOLHDL 2.8 02/16/2022   VLDL 8 02/16/2022   LDLCALC 99 02/16/2022   LDLCALC 120 (H) 12/24/2021    See Psychiatric  Specialty Exam and Suicide Risk Assessment completed by Attending Physician prior to discharge.  Discharge destination:  Home  Is patient on multiple  antipsychotic therapies at discharge:  No   Has Patient had three or more failed trials of antipsychotic monotherapy by history:  No  Recommended Plan for Multiple Antipsychotic Therapies: NA  Discharge Instructions     Diet - low sodium heart healthy   Complete by: As directed    Discharge instructions   Complete by: As directed    Follow up with [facility]   Increase activity slowly   Complete by: As directed       Allergies as of 09/11/2022   No Known Allergies      Medication List     STOP taking these medications    oxyCODONE 5 MG immediate release tablet Commonly known as: Roxicodone       TAKE these medications      Indication  paliperidone 6 MG 24 hr tablet Commonly known as: INVEGA Take 1 tablet (6 mg total) by mouth daily. Start taking on: September 12, 2022  Indication: Schizoaffective Disorder   traZODone 50 MG tablet Commonly known as: DESYREL Take 1 tablet (50 mg total) by mouth at bedtime.  Indication: [place] Follow up.   Why: Your appointment is scheduled for Wednesday, 09/13/22 at 11AM. Thanks! Contact information: [place] [address] [phone]                 Follow-up recommendations:  Activity:  as tolerated Diet:  heart healthy diet Schizoaffective disorder, depressed type: Invega 6 mg daily  Cocaine abuse: Follow with IOP at [facility] for SA  Comments:  follow up with [facility]  Signed: Waylan Boga, NP 09/11/2022, 10:00 AM

## 2022-09-11 NOTE — BHH Counselor (Signed)
CSW met with pt briefly to discuss discharge plans. He stated plans to return home upon discharge and notified CSW that he would need assistance with transportation. CSW confirmed address and informed him that transportation arrangements will be made. Pt and CSW discussed follow up with [facility] as pt had spoken with [facility] peer support specialist already. Pt is a smoker who denied interest in cessation at this time. He is also a substance use who declined interest in substance use treatment. CSW informed pt that [facility] guide to assistance would be attached to his paperwork as well. He agreed. No other concerns expressed. Contact ended without incident.   Chalmers Guest. Guerry Bruin, MSW, [place], [place] 09/11/2022 9:33 AM

## 2022-09-11 NOTE — [facility] Suicide Risk Assessment (Signed)
[place] INPATIENT:  Family/Significant Other Suicide Prevention Education  Suicide Prevention Education:  Patient Refusal for Family/Significant Other Suicide Prevention Education: The patient Dominic [name] has refused to provide written consent for family/significant other to be provided Family/Significant Other Suicide Prevention Education during admission and/or prior to discharge.  Physician notified.  SPE completed with pt, as pt refused to consent to family contact. SPI pamphlet provided to pt and pt was encouraged to share information with support network, ask questions, and talk about any concerns relating to SPE. Pt denies access to guns/firearms and verbalized understanding of information provided. Mobile Crisis information also provided to pt.  Pt was unable to identify contact information for his partner, [name]. CSW was unable to make contact and pt discharged after three days.   Shirl Harris 09/11/2022, 2:11 PM

## 2022-09-11 NOTE — Progress Notes (Signed)
Recreation Therapy Notes   Date: 09/11/2022  Time: 10:20am   Location: Courtyard   Behavioral response: Appropriate  Intervention Topic:  Wellness    Discussion/Intervention:  Group content today was focused on Wellness. The group defined wellness and some positive ways they make decisions for themselves. Individuals expressed reasons why they neglected any wellness in the past. Patients described ways to improve wellness skills in the future. The group explained what could happen if they did not do any wellness at all. Participants express how bad choices has affected them and others around them. Individual explained the importance of wellness. The group participated in the intervention "Testing my Wellness" where they had a chance to identify some of their weaknesses and strengths in wellness.  Clinical Observations/Feedback: Patient came to group and was focused on what peers and staff had to say about wellness. Participant was able to identify wellness activities they participate in outside the hospital. Individual was social with peers and staff while participating in the intervention.    Elmus Mathes LRT/CTRS         Jyla Hopf 09/11/2022 12:10 PM

## 2022-09-11 NOTE — Progress Notes (Signed)
  [facility] Adult Case Management Discharge Plan :  Will you be returning to the same living situation after discharge:  Yes,  pt plans  to discharge home. At discharge, do you have transportation home?: Yes,  CSW arranged transportation. Do you have the ability to pay for your medications: Yes,  [employer].  Release of information consent forms completed and in the chart;  Patient's signature needed at discharge.  Patient to Follow up at:  Follow-up Information     [place] Follow up.   Why: Your appointment is scheduled for Wednesday, 09/13/22 at 11AM. Thanks! Contact information: [place] [address] [phone]                 Next level of care provider has access to [place] and Suicide Prevention discussed: Yes,  SPE completed with pt.     Has patient been referred to the Quitline?: Patient refused referral  Patient has been referred for addiction treatment: Pt. refused referral  Shirl Harris, LCSW 09/11/2022, 10:50 AM

## 2022-09-11 NOTE — Progress Notes (Signed)
Patient ID: Dominic [name], male   DOB: 01/16/1976, 46 y.o.   MRN: [id number] Patient denies SI/HI/AVH. Belongings were returned to patient. Discharge instructions  including medication and follow up information were reviewed with patient and understanding was verbalized. Patient was not observed to be in distress at time of discharge. Patient was escorted out with staff to medical mall to be transported home by [employer].

## 2022-09-11 NOTE — Progress Notes (Signed)
Patient alert and oriented x 4, denies SI/HI/AVH. Patient's thoughts are organized and speech is coherent, he was visible in the milieu interacting with peers and staff. 15 minutes safety checks maintained will continue to monitor

## 2022-09-15 ENCOUNTER — Emergency Department
Admission: EM | Admit: 2022-09-15 | Discharge: 2022-09-15 | Disposition: A | Discharge status: Home / Self Care | Payer: No Typology Code available for payment source | Attending: Emergency Medicine | Admitting: Emergency Medicine

## 2022-09-15 ENCOUNTER — Other Ambulatory Visit: Payer: Self-pay

## 2022-09-15 ENCOUNTER — Encounter: Payer: Self-pay | Admitting: Emergency Medicine

## 2022-09-15 DIAGNOSIS — J45909 Unspecified asthma, uncomplicated: Secondary | ICD-10-CM | POA: Diagnosis not present

## 2022-09-15 DIAGNOSIS — F17210 Nicotine dependence, cigarettes, uncomplicated: Secondary | ICD-10-CM | POA: Insufficient documentation

## 2022-09-15 DIAGNOSIS — F1410 Cocaine abuse, uncomplicated: Secondary | ICD-10-CM | POA: Diagnosis present

## 2022-09-15 DIAGNOSIS — R45851 Suicidal ideations: Secondary | ICD-10-CM | POA: Diagnosis not present

## 2022-09-15 LAB — COMPREHENSIVE METABOLIC PANEL
ALT: 33 U/L (ref 0–44)
AST: 37 U/L (ref 15–41)
Albumin: 4.2 g/dL (ref 3.5–5.0)
Alkaline Phosphatase: 106 U/L (ref 38–126)
Anion gap: 8 (ref 5–15)
BUN: 14 mg/dL (ref 6–20)
CO2: 27 mmol/L (ref 22–32)
Calcium: 9.5 mg/dL (ref 8.9–10.3)
Chloride: 103 mmol/L (ref 98–111)
Creatinine, Ser: 0.73 mg/dL (ref 0.61–1.24)
GFR, Estimated: >60 mL/min (ref >60)
Glucose, Bld: 93 mg/dL (ref 70–99)
Potassium: 4.5 mmol/L (ref 3.5–5.1)
Sodium: 138 mmol/L (ref 135–145)
Total Bilirubin: 0.5 mg/dL (ref 0.3–1.2)
Total Protein: 9 g/dL — ABNORMAL HIGH (ref 6.5–8.1)

## 2022-09-15 LAB — CBC
HCT: 38.1 % — ABNORMAL LOW (ref 39.0–52.0)
Hemoglobin: 13.8 g/dL (ref 13.0–17.0)
MCH: 28.7 pg (ref 26.0–34.0)
MCHC: 36.2 g/dL — ABNORMAL HIGH (ref 30.0–36.0)
MCV: 79.2 fL — ABNORMAL LOW (ref 80.0–100.0)
Platelets: 305 10*3/uL (ref 150–400)
RBC: 4.81 MIL/uL (ref 4.22–5.81)
RDW: 14.3 % (ref 11.5–15.5)
WBC: 12.8 10*3/uL — ABNORMAL HIGH (ref 4.0–10.5)
nRBC: 0 % (ref 0.0–0.2)

## 2022-09-15 LAB — ETHANOL: Alcohol, Ethyl (B): <10 mg/dL (ref <10)

## 2022-09-15 LAB — SALICYLATE LEVEL: Salicylate Lvl: <7 mg/dL — ABNORMAL LOW (ref 7.0–30.0)

## 2022-09-15 LAB — ACETAMINOPHEN LEVEL: Acetaminophen (Tylenol), Serum: <10 ug/mL — ABNORMAL LOW (ref 10–30)

## 2022-09-15 MED ORDER — TRAZODONE HCL 50 MG PO TABS: 50.0000 mg | ORAL_TABLET | Freq: Every day | ORAL | Status: DC

## 2022-09-15 MED ORDER — DROPERIDOL 2.5 MG/ML IJ SOLN
5.0000 mg | Freq: Once | INTRAMUSCULAR | Status: AC
  Administered 2022-09-15: 5 mg via INTRAMUSCULAR

## 2022-09-15 MED ORDER — PALIPERIDONE ER 3 MG PO TB24
6.0000 mg | ORAL_TABLET | Freq: Every day | ORAL | Status: DC
  Administered 2022-09-15: 6 mg via ORAL
  Filled 2022-09-15: qty 2

## 2022-09-15 NOTE — ED Notes (Signed)
Pt aggressive with staff. Pt states "my hands aren't bisexual, get that man over here". Pt taking shirt off and unable to be verbally deescalated. IM medications ordered and administered by this RN. Pt took medications voluntarily.

## 2022-09-15 NOTE — ED Notes (Signed)
Pt stated during triage "if they give me sheets I'm going to hang myself".

## 2022-09-15 NOTE — Consult Note (Signed)
[facility] Face-to-Face Psychiatry Consult   Reason for Consult:  erratic behavior Referring Physician:  EDP Patient Identification: Dominic [name] MRN:  [id number] Principal Diagnosis: Cocaine abuse (HCC) Diagnosis:  Principal Problem:   Cocaine abuse (HCC)   Total Time spent with patient: 30 minutes  Subjective:  "I am not suicidal. I need to stop using cocaine."  Dominic [name] is a 46 y.o. male patient admitted with erratic behaviors.  HPI: Patient is well-known to this facility.  He presented with [facility] after he has alcohol and I told him that he wanted to.  Patient was process of being arrested for outstanding warrants. On evaluation, patient admits to using cocaine prior to encounter with police. He is speaking in clear, coherent manner. Linear speech. Denies suicidal thought or intent. Denies auditory or visual hallucinations.  Perceptions appear to be intact. .  Patient no longer meets criteria for involuntary commitment.  Patient does not meet criteria for psychiatric hospitalization.  Strongly advised follow-up with [facility] for substance use and mental health services. Past Psychiatric History: Schizoaffective disorder, substance use disorder  Risk to Self:   Risk to Others:   Prior Inpatient Therapy:   Prior Outpatient Therapy:    Past Medical History:  Past Medical History:  Diagnosis Date   Alcohol intoxication, uncomplicated (HCC) 01/13/2019   Anxiety    Asthma    Bipolar 1 disorder (HCC)    Chronic hepatitis C without hepatic coma (HCC) 05/28/2018   Depression    Hepatitis C    Hepatitis C antibody positive in blood 05/28/2018   Malingering 03/30/2022   Psychiatry note from [facility] states "directly admitted to fabricating SI and CAH to obtain [psychiatric] admission"   Schizophrenia (HCC)    Sleep apnea     Past Surgical History:  Procedure Laterality Date   CYST REMOVAL NECK     neck   INTRAMEDULLARY (IM) NAIL FIBULA Right 07/14/2022   Procedure: INTRAMEDULLARY (IM)  NAIL FIBULA;  Surgeon: Ross Marcus, MD;  Location: [facility] ORS;  Service: Orthopedics;  Laterality: Right;   TIBIA IM NAIL INSERTION Right 07/14/2022   Procedure: INTRAMEDULLARY (IM) NAIL TIBIAL;  Surgeon: Ross Marcus, MD;  Location: [facility] ORS;  Service: Orthopedics;  Laterality: Right;   Family History:  Family History  Problem Relation Age of Onset   Hypertension Sister    Schizophrenia Sister    Family Psychiatric  History:  Social History:  Social History   Substance and Sexual Activity  Alcohol Use Yes   Alcohol/week: 3.0 standard drinks of alcohol   Types: 3 Shots of liquor per week     Social History   Substance and Sexual Activity  Drug Use Yes   Types: Cocaine, Marijuana, Methamphetamines, Fentanyl   Comment: last night cocaine    Social History   Socioeconomic History   Marital status: Single    Spouse name: Not on file   Number of children: Not on file   Years of education: Not on file   Highest education level: Not on file  Occupational History   Not on file  Tobacco Use   Smoking status: Some Days    Packs/day: 1.00    Years: 10.00    Total pack years: 10.00    Types: Cigarettes   Smokeless tobacco: Never  Vaping Use   Vaping Use: Never used  Substance and Sexual Activity   Alcohol use: Yes    Alcohol/week: 3.0 standard drinks of alcohol    Types: 3 Shots of liquor per week  Drug use: Yes    Types: Cocaine, Marijuana, Methamphetamines, Fentanyl    Comment: last night cocaine   Sexual activity: Not Currently    Birth control/protection: Condom  Other Topics Concern   Not on file  Social History Narrative   Not on file   Social Determinants of Health   Financial Resource Strain: Low Risk  (11/25/2018)   Overall Financial Resource Strain (CARDIA)    Difficulty of Paying Living Expenses: Not very hard  Food Insecurity: Food Insecurity Present (09/08/2022)   Hunger Vital Sign    Worried About Running Out of Food in the Last Year: Often  true    Ran Out of Food in the Last Year: Often true  Transportation Needs: Unmet Transportation Needs (09/08/2022)   PRAPARE - Administrator, Civil Service (Medical): Yes    Lack of Transportation (Non-Medical): Yes  Physical Activity: Inactive (11/25/2018)   Exercise Vital Sign    Days of Exercise per Week: 0 days    Minutes of Exercise per Session: 0 min  Stress: No Stress Concern Present (11/25/2018)   [employer] of Occupational Health - Occupational Stress Questionnaire    Feeling of Stress : Only a little  Social Connections: Unknown (11/25/2018)   Social Connection and Isolation Panel [NHANES]    Frequency of Communication with Friends and Family: Twice a week    Frequency of Social Gatherings with Friends and Family: Twice a week    Attends Religious Services: Never    Database administrator or Organizations: No    Attends Banker Meetings: Never    Marital Status: Patient refused   Additional Social History:    Allergies:  No Known Allergies  Labs:  Results for orders placed or performed during the hospital encounter of 09/15/22 (from the past 48 hour(s))  Comprehensive metabolic panel     Status: Abnormal   Collection Time: 09/15/22  7:10 AM  Result Value Ref Range   Sodium 138 135 - 145 mmol/L   Potassium 4.5 3.5 - 5.1 mmol/L   Chloride 103 98 - 111 mmol/L   CO2 27 22 - 32 mmol/L   Glucose, Bld 93 70 - 99 mg/dL    Comment: Glucose reference range applies only to samples taken after fasting for at least 8 hours.   BUN 14 6 - 20 mg/dL   Creatinine, Ser 7.12 0.61 - 1.24 mg/dL   Calcium 9.5 8.9 - 45.8 mg/dL   Total Protein 9.0 (H) 6.5 - 8.1 g/dL   Albumin 4.2 3.5 - 5.0 g/dL   AST 37 15 - 41 U/L   ALT 33 0 - 44 U/L   Alkaline Phosphatase 106 38 - 126 U/L   Total Bilirubin 0.5 0.3 - 1.2 mg/dL   GFR, Estimated >09 >98 mL/min    Comment: (NOTE) Calculated using the CKD-EPI Creatinine Equation (2021)    Anion gap 8 5 - 15    Comment:  Performed at [facility], [address] [place], [place] [address]  Ethanol     Status: None   Collection Time: 09/15/22  7:10 AM  Result Value Ref Range   Alcohol, Ethyl (B) <10 <10 mg/dL    Comment: (NOTE) Lowest detectable limit for serum alcohol is 10 mg/dL.  For medical purposes only. Performed at [facility], [address] [place], [place] [address]   Salicylate level     Status: Abnormal   Collection Time: 09/15/22  7:10 AM  Result Value  Ref Range   Salicylate Lvl <7.0 (L) 7.0 - 30.0 mg/dL    Comment: Performed at [facility], [address] [place], [place] [address]  Acetaminophen level     Status: Abnormal   Collection Time: 09/15/22  7:10 AM  Result Value Ref Range   Acetaminophen (Tylenol), Serum <10 (L) 10 - 30 ug/mL    Comment: (NOTE) Therapeutic concentrations vary significantly. A range of 10-30 ug/mL  may be an effective concentration for many patients. However, some  are best treated at concentrations outside of this range. Acetaminophen concentrations >150 ug/mL at 4 hours after ingestion  and >50 ug/mL at 12 hours after ingestion are often associated with  toxic reactions.  Performed at [facility], [address] [place], [place] [address]   cbc     Status: Abnormal   Collection Time: 09/15/22  7:10 AM  Result Value Ref Range   WBC 12.8 (H) 4.0 - 10.5 K/uL   RBC 4.81 4.22 - 5.81 MIL/uL   Hemoglobin 13.8 13.0 - 17.0 g/dL   HCT 95.6 (L) 38.7 - 56.4 %   MCV 79.2 (L) 80.0 - 100.0 fL   MCH 28.7 26.0 - 34.0 pg   MCHC 36.2 (H) 30.0 - 36.0 g/dL   RDW 33.2 95.1 - 88.4 %   Platelets 305 150 - 400 K/uL   nRBC 0.0 0.0 - 0.2 %    Comment: Performed at [facility], [address], [place], [place] [address]    Current Facility-Administered Medications  Medication Dose Route Frequency Provider Last Rate Last Admin   paliperidone (INVEGA) 24 hr tablet 6 mg  6 mg Oral Daily Chesley Noon, MD   6 mg at  09/15/22 0933   traZODone (DESYREL) tablet 50 mg  50 mg Oral QHS Chesley Noon, MD       Current Outpatient Medications  Medication Sig Dispense Refill   paliperidone (INVEGA) 6 MG 24 hr tablet Take 1 tablet (6 mg total) by mouth daily. 30 tablet 0   traZODone (DESYREL) 50 MG tablet Take 1 tablet (50 mg total) by mouth at bedtime. 30 tablet 0    Musculoskeletal: Strength & Muscle Tone: within normal limits Gait & Station: normal Patient leans: N/A            Psychiatric Specialty Exam:  Presentation  General Appearance:  Appropriate for Environment  Eye Contact: Good  Speech: Clear and Coherent  Speech Volume: Normal  Handedness: Right   Mood and Affect  Mood: Euthymic  Affect: Congruent   Thought Process  Thought Processes: Coherent  Descriptions of Associations:Intact  Orientation:Full (Time, Place and Person)  Thought Content:Logical  History of Schizophrenia/Schizoaffective disorder:Yes  Duration of Psychotic Symptoms:Greater than six months  Hallucinations:Hallucinations: None  Ideas of Reference:None  Suicidal Thoughts:Suicidal Thoughts: No  Homicidal Thoughts:Homicidal Thoughts: No   Sensorium  Memory: Immediate Good; Remote Good; Recent Good  Judgment: Fair  Insight: Poor   Executive Functions  Concentration: Fair  Attention Span: Fair  Recall: Fair  Fund of Knowledge: Fair  Language: Fair   Psychomotor Activity  Psychomotor Activity:No data recorded  Assets  Assets: Communication Skills; Desire for Improvement; Housing; Health and safety inspector; Physical Health; Resilience; Social Support   Sleep  Sleep:Sleep: Good   Physical Exam: Physical Exam Vitals and nursing note reviewed.  HENT:     Head: Normocephalic.     Nose: No congestion or rhinorrhea.  Eyes:     General:        Right  eye: No discharge.        Left eye: No discharge.  Cardiovascular:     Rate and Rhythm: Normal  rate.  Pulmonary:     Effort: Pulmonary effort is normal.  Musculoskeletal:        General: Normal range of motion.  Neurological:     Mental Status: He is alert and oriented to person, place, and time.  Psychiatric:        Attention and Perception: Attention normal.        Mood and Affect: Mood normal.        Speech: Speech normal.        Behavior: Behavior normal.        Thought Content: Thought content normal.        Cognition and Memory: Cognition normal.        Judgment: Judgment is impulsive.    Review of Systems  Constitutional: Negative.   HENT: Negative.    Eyes: Negative.   Respiratory: Negative.    Musculoskeletal: Negative.   Skin: Negative.    Blood pressure (!) 160/111, pulse 85, temperature 98.3 F (36.8 C), temperature source Oral, resp. rate 16, SpO2 100 %. There is no height or weight on file to calculate BMI.  Treatment Plan Summary: Plan Discharge home to follow up with outpatient resources  Disposition: No evidence of imminent risk to self or others at present.   Patient does not meet criteria for psychiatric inpatient admission. Supportive therapy provided about ongoing stressors. Discussed crisis plan, support from social network, calling 911, coming to the Emergency Department, and calling Suicide Hotline.  Sherlon Handing, NP 09/15/2022 10:52 AM
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
## 2022-09-15 NOTE — ED Triage Notes (Signed)
Pt to ED via [facility] PD. Pt made statements to EMS and PD that he wishes to kill himself. While being registered pt asked officer to just "shoot me in the face". Pt stating that he wishes to die and that he wants to "just go home and kill myself".

## 2022-09-15 NOTE — ED Notes (Signed)
Pt belongings:  Owens Shark boots [employer]line [employer] sweatpants Black underwear Tan long sleeve White tank top x2 [employer] Green sweat [employer] phone White socks Black underwear [employer] x2 Black T shirt [employer] top [employer]

## 2022-09-19 ENCOUNTER — Other Ambulatory Visit (HOSPITAL_COMMUNITY): Payer: Self-pay

## 2022-10-13 ENCOUNTER — Other Ambulatory Visit (HOSPITAL_COMMUNITY): Payer: Self-pay

## 2022-10-31 ENCOUNTER — Other Ambulatory Visit: Payer: Self-pay

## 2022-11-30 ENCOUNTER — Other Ambulatory Visit (HOSPITAL_COMMUNITY): Payer: Self-pay

## 2023-02-16 ENCOUNTER — Other Ambulatory Visit: Payer: Self-pay

## 2023-02-20 ENCOUNTER — Other Ambulatory Visit (HOSPITAL_COMMUNITY): Payer: Self-pay

## 2023-02-21 ENCOUNTER — Other Ambulatory Visit (HOSPITAL_COMMUNITY): Payer: Self-pay

## 2023-04-18 ENCOUNTER — Emergency Department
Admission: EM | Admit: 2023-04-18 | Discharge: 2023-04-18 | Disposition: A | Discharge status: Home / Self Care | Payer: Medicaid Other | Attending: Emergency Medicine | Admitting: Emergency Medicine

## 2023-04-18 ENCOUNTER — Other Ambulatory Visit: Payer: Self-pay

## 2023-04-18 DIAGNOSIS — R451 Restlessness and agitation: Secondary | ICD-10-CM | POA: Insufficient documentation

## 2023-04-18 DIAGNOSIS — F259 Schizoaffective disorder, unspecified: Secondary | ICD-10-CM | POA: Insufficient documentation

## 2023-04-18 DIAGNOSIS — S6992XA Unspecified injury of left wrist, hand and finger(s), initial encounter: Secondary | ICD-10-CM | POA: Diagnosis present

## 2023-04-18 DIAGNOSIS — S61512A Laceration without foreign body of left wrist, initial encounter: Secondary | ICD-10-CM | POA: Diagnosis not present

## 2023-04-18 DIAGNOSIS — Z0279 Encounter for issue of other medical certificate: Secondary | ICD-10-CM | POA: Diagnosis not present

## 2023-04-18 MED ORDER — LORAZEPAM 2 MG/ML IJ SOLN
2.0000 mg | Freq: Once | INTRAMUSCULAR | Status: AC
  Administered 2023-04-18: 2 mg via INTRAMUSCULAR
  Filled 2023-04-18: qty 1

## 2023-04-18 MED ORDER — LIDOCAINE HCL (PF) 1 % IJ SOLN
INTRAMUSCULAR | Status: AC
  Administered 2023-04-18: 5 mL via INTRADERMAL
  Filled 2023-04-18: qty 5

## 2023-04-18 MED ORDER — LIDOCAINE HCL (PF) 1 % IJ SOLN
5.0000 mL | Freq: Once | INTRAMUSCULAR | Status: AC
  Filled 2023-04-18: qty 5

## 2023-04-18 NOTE — ED Notes (Signed)
Pt sutures dressed with telfa and gauze. Pt given snack, PD at bedside
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
## 2023-04-18 NOTE — Discharge Instructions (Signed)
Return to the ER in approximately 10 days to have the sutures removed.  You may shower but do not submerge the wounds underwater.  Return to the ER for new, worsening, or persistent severe pain, swelling, redness around the wounds, bleeding, pus drainage, difficulty moving the wrist, or any other new or worsening symptoms that concern you.
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
## 2023-11-19 ENCOUNTER — Other Ambulatory Visit (HOSPITAL_COMMUNITY): Payer: Self-pay

## 2024-08-17 ENCOUNTER — Inpatient Hospital Stay
Admit: 2024-08-17 | Discharge: 2024-08-17 | Disposition: A | Discharge status: Home / Self Care | Payer: Self-pay | Attending: Student in an Organized Health Care Education/Training Program | Admitting: Student in an Organized Health Care Education/Training Program

## 2024-08-17 ENCOUNTER — Other Ambulatory Visit: Payer: Self-pay

## 2024-08-17 ENCOUNTER — Emergency Department: Payer: Self-pay

## 2024-08-17 ENCOUNTER — Inpatient Hospital Stay
Admission: EM | Admit: 2024-08-17 | Discharge: 2024-08-18 | DRG: 062 | Disposition: A | Discharge status: Home / Self Care | Payer: Self-pay | Attending: Internal Medicine | Admitting: Internal Medicine

## 2024-08-17 DIAGNOSIS — R079 Chest pain, unspecified: Secondary | ICD-10-CM

## 2024-08-17 DIAGNOSIS — I440 Atrioventricular block, first degree: Secondary | ICD-10-CM | POA: Diagnosis present

## 2024-08-17 DIAGNOSIS — F259 Schizoaffective disorder, unspecified: Secondary | ICD-10-CM | POA: Diagnosis present

## 2024-08-17 DIAGNOSIS — F1410 Cocaine abuse, uncomplicated: Secondary | ICD-10-CM | POA: Diagnosis present

## 2024-08-17 DIAGNOSIS — Z5901 Sheltered homelessness: Secondary | ICD-10-CM

## 2024-08-17 DIAGNOSIS — T405X1A Poisoning by cocaine, accidental (unintentional), initial encounter: Secondary | ICD-10-CM | POA: Diagnosis present

## 2024-08-17 DIAGNOSIS — F319 Bipolar disorder, unspecified: Secondary | ICD-10-CM | POA: Diagnosis present

## 2024-08-17 DIAGNOSIS — F17210 Nicotine dependence, cigarettes, uncomplicated: Secondary | ICD-10-CM | POA: Diagnosis present

## 2024-08-17 DIAGNOSIS — G8193 Hemiplegia, unspecified affecting right nondominant side: Secondary | ICD-10-CM | POA: Diagnosis present

## 2024-08-17 DIAGNOSIS — R29703 NIHSS score 3: Secondary | ICD-10-CM | POA: Diagnosis present

## 2024-08-17 DIAGNOSIS — I639 Cerebral infarction, unspecified: Secondary | ICD-10-CM | POA: Diagnosis present

## 2024-08-17 DIAGNOSIS — Z8249 Family history of ischemic heart disease and other diseases of the circulatory system: Secondary | ICD-10-CM

## 2024-08-17 DIAGNOSIS — F1910 Other psychoactive substance abuse, uncomplicated: Secondary | ICD-10-CM | POA: Diagnosis not present

## 2024-08-17 DIAGNOSIS — R0789 Other chest pain: Secondary | ICD-10-CM | POA: Diagnosis present

## 2024-08-17 DIAGNOSIS — I6381 Other cerebral infarction due to occlusion or stenosis of small artery: Principal | ICD-10-CM | POA: Diagnosis present

## 2024-08-17 DIAGNOSIS — F1490 Cocaine use, unspecified, uncomplicated: Secondary | ICD-10-CM | POA: Diagnosis not present

## 2024-08-17 DIAGNOSIS — R29818 Other symptoms and signs involving the nervous system: Secondary | ICD-10-CM

## 2024-08-17 DIAGNOSIS — Z7151 Drug abuse counseling and surveillance of drug abuser: Secondary | ICD-10-CM

## 2024-08-17 DIAGNOSIS — F1010 Alcohol abuse, uncomplicated: Secondary | ICD-10-CM | POA: Diagnosis present

## 2024-08-17 DIAGNOSIS — E785 Hyperlipidemia, unspecified: Secondary | ICD-10-CM | POA: Diagnosis present

## 2024-08-17 DIAGNOSIS — Y905 Blood alcohol level of 100-119 mg/100 ml: Secondary | ICD-10-CM | POA: Diagnosis present

## 2024-08-17 DIAGNOSIS — R531 Weakness: Principal | ICD-10-CM

## 2024-08-17 LAB — CBC
HCT: 37.1 % — ABNORMAL LOW (ref 39.0–52.0)
Hemoglobin: 13.7 g/dL (ref 13.0–17.0)
MCH: 29.1 pg (ref 26.0–34.0)
MCHC: 36.9 g/dL — ABNORMAL HIGH (ref 30.0–36.0)
MCV: 78.9 fL — ABNORMAL LOW (ref 80.0–100.0)
Platelets: 180 K/uL (ref 150–400)
RBC: 4.7 MIL/uL (ref 4.22–5.81)
RDW: 14 % (ref 11.5–15.5)
WBC: 12.4 K/uL — ABNORMAL HIGH (ref 4.0–10.5)
nRBC: 0 % (ref 0.0–0.2)

## 2024-08-17 LAB — ECHOCARDIOGRAM COMPLETE
AR max vel: 3.93 cm2
AV Peak grad: 7.4 mmHg
Ao pk vel: 1.36 m/s
Area-P 1/2: 3.46 cm2
Height: 71 in
S' Lateral: 3.1 cm
Weight: 3495.61 [oz_av]

## 2024-08-17 LAB — COMPREHENSIVE METABOLIC PANEL WITH GFR
ALT: 15 U/L (ref 0–44)
AST: 35 U/L (ref 15–41)
Albumin: 4.2 g/dL (ref 3.5–5.0)
Alkaline Phosphatase: 41 U/L (ref 38–126)
Anion gap: 15 (ref 5–15)
BUN: 13 mg/dL (ref 6–20)
CO2: 20 mmol/L — ABNORMAL LOW (ref 22–32)
Calcium: 9 mg/dL (ref 8.9–10.3)
Chloride: 103 mmol/L (ref 98–111)
Creatinine, Ser: 0.98 mg/dL (ref 0.61–1.24)
GFR, Estimated: >60 mL/min (ref >60)
Glucose, Bld: 83 mg/dL (ref 70–99)
Potassium: 3.5 mmol/L (ref 3.5–5.1)
Sodium: 138 mmol/L (ref 135–145)
Total Bilirubin: 0.8 mg/dL (ref 0.0–1.2)
Total Protein: 8.2 g/dL — ABNORMAL HIGH (ref 6.5–8.1)

## 2024-08-17 LAB — PROTIME-INR
INR: 1 (ref 0.8–1.2)
Prothrombin Time: 13.6 s (ref 11.4–15.2)

## 2024-08-17 LAB — HEMOGLOBIN A1C
Hgb A1c MFr Bld: 5.1 % (ref 4.8–5.6)
Mean Plasma Glucose: 99.67 mg/dL

## 2024-08-17 LAB — DIFFERENTIAL
Abs Immature Granulocytes: 0.05 K/uL (ref 0.00–0.07)
Basophils Absolute: 0 K/uL (ref 0.0–0.1)
Basophils Relative: 0 %
Eosinophils Absolute: 0 K/uL (ref 0.0–0.5)
Eosinophils Relative: 0 %
Immature Granulocytes: 0 %
Lymphocytes Relative: 33 %
Lymphs Abs: 4 K/uL (ref 0.7–4.0)
Monocytes Absolute: 0.9 K/uL (ref 0.1–1.0)
Monocytes Relative: 7 %
Neutro Abs: 7.4 K/uL (ref 1.7–7.7)
Neutrophils Relative %: 60 %

## 2024-08-17 LAB — HIV ANTIBODY (ROUTINE TESTING W REFLEX): HIV Screen 4th Generation wRfx: NONREACTIVE

## 2024-08-17 LAB — CBG MONITORING, ED: Glucose-Capillary: 85 mg/dL (ref 70–99)

## 2024-08-17 LAB — URINE DRUG SCREEN, QUALITATIVE (ARMC ONLY)
Amphetamines, Ur Screen: NOT DETECTED
Barbiturates, Ur Screen: NOT DETECTED
Benzodiazepine, Ur Scrn: NOT DETECTED
Cannabinoid 50 Ng, Ur ~~LOC~~: NOT DETECTED
Cocaine Metabolite,Ur ~~LOC~~: POSITIVE — AB
MDMA (Ecstasy)Ur Screen: NOT DETECTED
Methadone Scn, Ur: NOT DETECTED
Opiate, Ur Screen: NOT DETECTED
Phencyclidine (PCP) Ur S: NOT DETECTED
Tricyclic, Ur Screen: NOT DETECTED

## 2024-08-17 LAB — ETHANOL: Alcohol, Ethyl (B): 101 mg/dL — ABNORMAL HIGH (ref <15)

## 2024-08-17 LAB — GLUCOSE, CAPILLARY
Glucose-Capillary: 61 mg/dL — ABNORMAL LOW (ref 70–99)
Glucose-Capillary: 64 mg/dL — ABNORMAL LOW (ref 70–99)
Glucose-Capillary: 72 mg/dL (ref 70–99)

## 2024-08-17 LAB — TROPONIN I (HIGH SENSITIVITY)
Troponin I (High Sensitivity): 30 ng/L — ABNORMAL HIGH (ref <18)
Troponin I (High Sensitivity): 26 ng/L — ABNORMAL HIGH (ref <18)

## 2024-08-17 LAB — MRSA NEXT GEN BY PCR, NASAL: MRSA by PCR Next Gen: NOT DETECTED

## 2024-08-17 LAB — APTT: aPTT: 25 s (ref 24–36)

## 2024-08-17 MED ORDER — ORAL CARE MOUTH RINSE: 15.0000 mL | OROMUCOSAL | Status: DC | PRN

## 2024-08-17 MED ORDER — SODIUM CHLORIDE 0.9% FLUSH
3.0000 mL | Freq: Once | INTRAVENOUS | Status: AC
  Administered 2024-08-17: 3 mL via INTRAVENOUS

## 2024-08-17 MED ORDER — SODIUM CHLORIDE 0.9 % IV SOLN: INTRAVENOUS | Status: DC

## 2024-08-17 MED ORDER — TENECTEPLASE FOR STROKE
0.2500 mg/kg | PACK | Freq: Once | INTRAVENOUS | Status: AC
  Administered 2024-08-17: 25 mg via INTRAVENOUS
  Filled 2024-08-17: qty 10

## 2024-08-17 MED ORDER — ACETAMINOPHEN 325 MG PO TABS: 650.0000 mg | ORAL_TABLET | Freq: Four times a day (QID) | ORAL | Status: DC | PRN

## 2024-08-17 MED ORDER — CHLORHEXIDINE GLUCONATE CLOTH 2 % EX PADS
6.0000 | MEDICATED_PAD | Freq: Every day | CUTANEOUS | Status: DC
  Administered 2024-08-17: 6 via TOPICAL

## 2024-08-17 MED ORDER — OXYCODONE HCL 5 MG PO TABS
5.0000 mg | ORAL_TABLET | Freq: Four times a day (QID) | ORAL | Status: DC | PRN
  Administered 2024-08-17: 5 mg via ORAL
  Filled 2024-08-17: qty 1

## 2024-08-17 MED ORDER — ACETAMINOPHEN 650 MG RE SUPP: 650.0000 mg | RECTAL | Status: DC | PRN

## 2024-08-17 MED ORDER — IOHEXOL 350 MG/ML SOLN
150.0000 mL | Freq: Once | INTRAVENOUS | Status: AC | PRN
  Administered 2024-08-17: 150 mL via INTRAVENOUS

## 2024-08-17 MED ORDER — LACTATED RINGERS IV SOLN: INTRAVENOUS | Status: DC

## 2024-08-17 MED ORDER — SENNOSIDES-DOCUSATE SODIUM 8.6-50 MG PO TABS: 1.0000 | ORAL_TABLET | Freq: Every evening | ORAL | Status: DC | PRN

## 2024-08-17 MED ORDER — ACETAMINOPHEN 160 MG/5ML PO SOLN: 650.0000 mg | ORAL | Status: DC | PRN

## 2024-08-17 MED ORDER — PANTOPRAZOLE SODIUM 40 MG IV SOLR
40.0000 mg | Freq: Every day | INTRAVENOUS | Status: DC
Start: 2024-08-17 — End: 2024-08-18
  Administered 2024-08-17: 40 mg via INTRAVENOUS
  Filled 2024-08-17: qty 10

## 2024-08-17 MED ORDER — PROCHLORPERAZINE EDISYLATE 10 MG/2ML IJ SOLN: 5.0000 mg | Freq: Four times a day (QID) | INTRAMUSCULAR | Status: DC | PRN

## 2024-08-17 MED ORDER — HYDROMORPHONE HCL 1 MG/ML IJ SOLN
0.5000 mg | INTRAMUSCULAR | Status: DC | PRN
  Administered 2024-08-17: 0.5 mg via INTRAVENOUS
  Filled 2024-08-17: qty 0.5

## 2024-08-17 MED ORDER — STROKE: EARLY STAGES OF RECOVERY BOOK: Freq: Once | Status: AC

## 2024-08-17 MED ORDER — ACETAMINOPHEN 325 MG PO TABS
650.0000 mg | ORAL_TABLET | ORAL | Status: DC | PRN
  Administered 2024-08-17: 650 mg via ORAL
  Filled 2024-08-17: qty 2

## 2024-08-17 NOTE — H&P (Signed)
 [facility] Pulmonary Medicine Consultation      Date: 08/17/2024,   MRN# [id number] Dominic [name] 08/10/1976   CHIEF COMPLAINT:   RT SIDED WEAKNESS AND COCAINE USE   HISTORY OF PRESENT ILLNESS  48 year old left handed male with history of polysubstance abuse who presents to the hospital with right-sided weakness and chest pain. Last normal at approximately 3:20 AM.   He reported onset of symptoms shortly after smoking a homemade cigarette at a gas station. Ethanol level 101 on arrival.  +COCAINE    On examination, he had weakness in the right leg and difficulty making a fist with his right hand. No drift in the right arm. He also had numbness on the right side.   CT head with no acute findings. CTA head and neck without evidence of LVO. Obtained CTA chest given he was within the TNK window to rule out dissection with the associated chest pain and this was negative.   Tenecteplase administration reviewed with the patient. Risks, benefits, side effects, potential complications and dangers of medications discussed with  Patient  He will be admitted for post-TNK care and monitoring. TNKase given 0443AM  NIH stroke scale 3 when TNK given    PAST MEDICAL HISTORY   Past Medical History:  Diagnosis Date   Alcohol intoxication, uncomplicated 01/13/2019   Anxiety    Asthma    Bipolar 1 disorder (HCC)    Chronic hepatitis C without hepatic coma (HCC) 05/28/2018   Depression    Hepatitis C    Hepatitis C antibody positive in blood 05/28/2018   Malingering 03/30/2022   Psychiatry note from [facility] states directly admitted to fabricating SI and CAH to obtain [psychiatric] admission   Schizophrenia (HCC)    Sleep apnea      SURGICAL HISTORY   Past Surgical History:  Procedure Laterality Date   CYST REMOVAL NECK     neck   INTRAMEDULLARY (IM) NAIL FIBULA Right 07/14/2022   Procedure: INTRAMEDULLARY (IM) NAIL FIBULA;  Surgeon: Rollene Cough, MD;  Location: [facility] ORS;   Service: Orthopedics;  Laterality: Right;   TIBIA IM NAIL INSERTION Right 07/14/2022   Procedure: INTRAMEDULLARY (IM) NAIL TIBIAL;  Surgeon: Rollene Cough, MD;  Location: [facility] ORS;  Service: Orthopedics;  Laterality: Right;     FAMILY HISTORY   Family History  Problem Relation Age of Onset   Hypertension Sister    Schizophrenia Sister      SOCIAL HISTORY   Social History   Tobacco Use   Smoking status: Some Days    Current packs/day: 1.00    Average packs/day: 1 pack/day for 10.0 years (10.0 ttl pk-yrs)    Types: Cigarettes   Smokeless tobacco: Never  Vaping Use   Vaping status: Never Used  Substance Use Topics   Alcohol use: Yes    Alcohol/week: 3.0 standard drinks of alcohol    Types: 3 Shots of liquor per week   Drug use: Yes    Types: Cocaine, Marijuana, Methamphetamines, Fentanyl     Comment: last night cocaine     MEDICATIONS    Home Medication:  Current Outpatient Rx   Order #: [id number] Class: Normal   Order #: [id number] Class: Normal    Current Medication:  Current Facility-Administered Medications:    [START ON 08/18/2024]  stroke: early stages of recovery book, , Does not apply, Once, Voncile Isles, MD   0.9 %  sodium chloride  infusion, , Intravenous, Continuous, Arora, Ashish, MD   acetaminophen  (TYLENOL ) tablet 650 mg, 650 mg, Oral, Q4H  PRN **OR** acetaminophen  (TYLENOL ) 160 MG/5ML solution 650 mg, 650 mg, Per Tube, Q4H PRN **OR** acetaminophen  (TYLENOL ) suppository 650 mg, 650 mg, Rectal, Q4H PRN, Arora, Ashish, MD   HYDROmorphone  (DILAUDID ) injection 0.5 mg, 0.5 mg, Intravenous, Q4H PRN, Shona, Carole N, DO, 0.5 mg at 08/17/24 0551   lactated ringers  infusion, , Intravenous, Continuous, Shona Terry SAILOR, DO, Last Rate: 75 mL/hr at 08/17/24 0558, New Bag at 08/17/24 0558   oxyCODONE  (Oxy IR/ROXICODONE ) immediate release tablet 5 mg, 5 mg, Oral, Q6H PRN, Shona, Carole N, DO   pantoprazole  (PROTONIX ) injection 40 mg, 40 mg, Intravenous, QHS, Arora, Ashish,  MD   prochlorperazine (COMPAZINE) injection 5 mg, 5 mg, Intravenous, Q6H PRN, Hall, Carole N, DO   senna-docusate (Senokot-S) tablet 1 tablet, 1 tablet, Oral, QHS PRN, Arora, Ashish, MD  Current Outpatient Medications:    paliperidone  (INVEGA ) 6 MG 24 hr tablet, Take 1 tablet (6 mg total) by mouth daily., Disp: 30 tablet, Rfl: 0   traZODone  (DESYREL ) 50 MG tablet, Take 1 tablet (50 mg total) by mouth at bedtime., Disp: 30 tablet, Rfl: 0    ALLERGIES   Patient has no known allergies.   BP 128/85   Pulse 84   Temp 97.9 F (36.6 C)   Resp 19   Ht 5' 11 (1.803 m)   Wt 100.4 kg   SpO2 98%   BMI 30.87 kg/m    Review of Systems: Gen:  Denies  fever, sweats, chills weight loss  HEENT: Denies blurred vision, double vision, ear pain, eye pain, hearing loss, nose bleeds, sore throat Cardiac:  No dizziness, chest pain or heaviness, chest tightness,edema, No JVD Resp:   No cough, -sputum production, -shortness of breath,-wheezing, -hemoptysis,  Other:  All other systems negative   Physical Examination:   General Appearance: No distress  EYES PERRLA, EOM intact.   NECK Supple, No JVD Pulmonary: normal breath sounds, No wheezing.  CardiovascularNormal S1,S2.  No m/r/g.   Abdomen: Benign, Soft, non-tender. Neurology UE/LE 5/5 strength, no focal deficits Ext pulses intact, cap refill intact ALL OTHER ROS ARE NEGATIVE      IMAGING    CT Angio Chest/Abd/Pel for Dissection W and/or Wo Contrast Result Date: 08/17/2024 EXAM: CTA CHEST, ABDOMEN AND PELVIS WITH AND WITHOUT CONTRAST 08/17/2024 04:30:50 AM TECHNIQUE: CTA of the chest was performed with and without the administration of 150 mL of iohexol  (OMNIPAQUE ) 350 MG/ML injection. CTA of the abdomen and pelvis was performed with and without the administration of 150 mL of iohexol  (OMNIPAQUE ) 350 MG/ML injection. Multiplanar reformatted images are provided for review. MIP images are provided for review. Automated exposure control,  iterative reconstruction, and/or weight based adjustment of the mA/kV was utilized to reduce the radiation dose to as low as reasonably achievable. COMPARISON: CT of the chest, abdomen and pelvis dated 07/14/2022. CLINICAL HISTORY: Acute aortic syndrome (AAS) suspected. FINDINGS: VASCULATURE: AORTA: No acute finding. No abdominal aortic aneurysm. No dissection. PULMONARY ARTERIES: No pulmonary embolism with the limits of this exam. GREAT VESSELS OF AORTIC ARCH: No acute finding. No dissection. No arterial occlusion or significant stenosis. CELIAC TRUNK: No acute finding. No occlusion or significant stenosis. SUPERIOR MESENTERIC ARTERY: No acute finding. No occlusion or significant stenosis. INFERIOR MESENTERIC ARTERY: No acute finding. No occlusion or significant stenosis. RENAL ARTERIES: No acute finding. No occlusion or significant stenosis. ILIAC ARTERIES: No acute finding. No occlusion or significant stenosis. CHEST: MEDIASTINUM: No mediastinal lymphadenopathy. The heart and pericardium demonstrate no acute abnormality. LUNGS AND PLEURA: There  is mild atelectasis and bronchiectasis present dependently within the lower lobes bilaterally. There is also some focal bronchiectasis present posteriorly within the right upper lobe. No focal consolidation or pulmonary edema. No evidence of pleural effusion or pneumothorax. THORACIC BONES AND SOFT TISSUES: No acute bone or soft tissue abnormality. ABDOMEN AND PELVIS: LIVER: The liver is unremarkable. GALLBLADDER AND BILE DUCTS: Gallbladder is unremarkable. No biliary ductal dilatation. SPLEEN: The spleen is unremarkable. PANCREAS: The pancreas is unremarkable. ADRENAL GLANDS: Bilateral adrenal glands demonstrate no acute abnormality. KIDNEYS, URETERS AND BLADDER: No stones in the kidneys or ureters. No hydronephrosis. No perinephric or periureteral stranding. Urinary bladder is unremarkable. GI AND BOWEL: Stomach and duodenal sweep demonstrate no acute abnormality. There  are several chronic diverticula present. There is no bowel obstruction. No abnormal bowel wall thickening or distension. REPRODUCTIVE: Reproductive organs are unremarkable. PERITONEUM AND RETROPERITONEUM: No ascites or free air. LYMPH NODES: No lymphadenopathy. ABDOMINAL BONES AND SOFT TISSUES: No acute abnormality of the bones. No acute soft tissue abnormality. IMPRESSION: 1. No evidence of acute aortic syndrome. Electronically signed by: Evalene Coho MD 08/17/2024 04:54 AM EDT RP Workstation: [id number]   CT ANGIO HEAD NECK W WO CM (CODE STROKE) Result Date: 08/17/2024 EXAM: CTA Head and Neck with Intravenous Contrast. CT Head without Contrast. CLINICAL HISTORY: Neuro deficit, acute, stroke suspected. TECHNIQUE: Axial CTA images of the head and neck performed with intravenous contrast. MIP reconstructed images were created and reviewed. Axial computed tomography images of the head/brain performed without intravenous contrast. Note: Per PQRS, the description of internal carotid artery percent stenosis, including 0 percent or normal exam, is based on [place] American Symptomatic Carotid Endarterectomy Trial (NASCET) criteria. Dose reduction technique was used including one or more of the following: automated exposure control, adjustment of mA and kV according to patient size, and/or iterative reconstruction. CONTRAST: Without and with; 150mL (iohexol  (OMNIPAQUE ) 350 MG/ML injection 150 mL IOHEXOL  350 MG/ML SOLN) COMPARISON: None provided. FINDINGS: CT HEAD: BRAIN: No acute intraparenchymal hemorrhage. No mass lesion. No CT evidence for acute territorial infarct. No midline shift or extra-axial collection. VENTRICLES: No hydrocephalus. ORBITS: The orbits are unremarkable. SINUSES AND MASTOIDS: The paranasal sinuses and mastoid air cells are clear. CTA NECK: COMMON CAROTID ARTERIES: No significant stenosis. No dissection or occlusion. INTERNAL CAROTID ARTERIES: No stenosis by NASCET criteria. No dissection or  occlusion. VERTEBRAL ARTERIES: No significant stenosis. No dissection or occlusion. CTA HEAD: ANTERIOR CEREBRAL ARTERIES: No significant stenosis. No occlusion. No aneurysm. MIDDLE CEREBRAL ARTERIES: No significant stenosis. No occlusion. No aneurysm. POSTERIOR CEREBRAL ARTERIES: No significant stenosis. No occlusion. No aneurysm. BASILAR ARTERY: No significant stenosis. No occlusion. No aneurysm. OTHER: SOFT TISSUES: There is mild level 1 and level 2 cervical lymphadenopathy bilaterally, slightly more pronounced on the right. There is a right level 2 node measuring up to 14 mm in AP diameter. There is relative hypertrophy of the right Sternocleidomastoid muscle. Please note that the above findings were discussed with Dr. Suzanne at 4:45 am 08/17/2024. BONES: No acute osseous abnormality. IMPRESSION: 1. No acute intracranial hemorrhage or ischemic change. 2. Mild bilateral level 1 and level 2 cervical lymphadenopathy, slightly more pronounced on the right, with a right level 2 node measuring up to 14 mm in AP diameter. 3. Relative hypertrophy of the right sternocleidomastoid muscle. 4. Findings discussed with Dr. Suzanne at 4:45 AM on 08/17/2024. Electronically signed by: Evalene Coho MD 08/17/2024 04:48 AM EDT RP Workstation: [id number]   CT HEAD CODE STROKE WO CONTRAST Result Date: 08/17/2024 EXAM: CT HEAD  WITHOUT CONTRAST 08/17/2024 04:02:41 AM TECHNIQUE: CT of the head was performed without the administration of intravenous contrast. Automated exposure control, iterative reconstruction, and/or weight based adjustment of the mA/kV was utilized to reduce the radiation dose to as low as reasonably achievable. COMPARISON: CT of the head dated 07/14/2022. CLINICAL HISTORY: Neuro deficit, acute, stroke suspected. Code stroke; Right side weakness; Lkw 0300; Dr suzanne [phone]. FINDINGS: BRAIN AND VENTRICLES: No acute hemorrhage. No evidence of acute infarct. No hydrocephalus. No extra-axial collection. No mass  effect or midline shift. ORBITS: No acute abnormality. SINUSES: No acute abnormality. SOFT TISSUES AND SKULL: No acute soft tissue abnormality. No skull fracture. [place] Stroke Program Early CT (ASPECT) score: Ganglionic (caudate, IC, Lentiform Nucleus, insula, M1-M3): 7. Supraganglionic (M4-M6): 3. Total: 10. The above findings were discussed with Dr. suzanne at 04:12 AM 08/17/2024. IMPRESSION: 1. No acute intracranial abnormality related to the suspected stroke. Electronically signed by: Evalene Coho MD 08/17/2024 04:14 AM EDT RP Workstation: [id number]      ASSESSMENT/PLAN    48 year old acute stroke status post TNKase in the setting of active cocaine abuse and alcohol abuse  Neurology Admission using stroke TNKase order set Follow  protocols Follow-up assessment neurology Watch for DTs Consider high-dose thiamine   Chest pain due to cocaine No evidence of MI CTA chest negative for dissection  ENDO - ICU hypoglycemic\Hyperglycemia protocol -check FSBS per protocol   GI GI PROPHYLAXIS as indicated NUTRITIONAL STATUS DIET-->as tolerated Constipation protocol as indicated   ELECTROLYTES -follow labs as needed -replace as needed -pharmacy consultation and following  RESTRICTIVE TRANSFUSION PROTOCOL TRANSFUSION  IF HGB<7  or ACTIVE BLEEDING OR DX of ACUTE CORONARY SYNDROMES      DVT/GI PRX  assessed I Assessed the need for Labs I Assessed the need for Foley I Assessed the need for Central Venous Line Family Discussion when available I Assessed the need for Mobilization I made an Assessment of medications to be adjusted accordingly Safety Risk assessment completed  CASE DISCUSSED IN MULTIDISCIPLINARY ROUNDS WITH ICU TEAM     Critical Care Time devoted to patient care services described in this note is 65 minutes.  Critical care was necessary to treat /prevent imminent and life-threatening deterioration.   Nickolas Alm Cellar, M.D.  [facility]  Medical Director [facility]

## 2024-08-17 NOTE — Progress Notes (Signed)
 PT Cancellation Note  Patient Details Name: Dominic [name] MRN: [id number] DOB: 01-11-1976   Cancelled Treatment:    Reason Eval/Treat Not Completed: Medical issues which prohibited therapy Pt received TNK early this AM, per protocol PT to initiate after 24 hours and follow up imaging.  Will plan for PT eval 10/6.   Dominic [name] 08/17/2024, 5:31 PM

## 2024-08-17 NOTE — ED Notes (Signed)
 Called CCMD spoke with Grisele to place pt on cardiac monitor.

## 2024-08-17 NOTE — ED Notes (Signed)
 Patient taken immediately to CT 2 code stroke via EMS.

## 2024-08-17 NOTE — ED Provider Notes (Signed)
 [facility] Provider Note    Event Date/Time   First MD Initiated Contact with Patient 08/17/24 [phone]     (approximate)   History   No chief complaint on file.   HPI  Dominic [name] is a 48 y.o. male past medical history significant for schizoaffective disorder, polysubstance use disorder, who presents to the emergency department as an activated code stroke.  Patient's last known well was 30 minutes prior to arrival.  States that he was at a halfway house and walked to the gas station and smoked a home rolled cigarette that another person at the gas station gave him.  Afterwards developed left-sided chest pain.  When EMS arrived for left-sided chest pain noted that he had no grip strength and weakness to the right upper extremity.  Denies any falls or head trauma.  Complaining of numbness and weakness and heaviness in his right arm.  No prior history of CVA.  Endorses sharp chest pain that he just scribes as a stabbing sensation.  Denies nausea or vomiting.  Denies shortness of breath.  Not on anticoagulation.  Glucose with EMS 85.     Physical Exam   Triage Vital Signs: ED Triage Vitals  Encounter Vitals Group     BP      Girls Systolic BP Percentile      Girls Diastolic BP Percentile      Boys Systolic BP Percentile      Boys Diastolic BP Percentile      Pulse      Resp      Temp      Temp src      SpO2      Weight      Height      Head Circumference      Peak Flow      Pain Score      Pain Loc      Pain Education      Exclude from Growth Chart     Most recent vital signs: Vitals:   08/17/24 0430 08/17/24 0435  BP: (!) 141/107 (!) 145/96  Pulse: 78 77  Resp: 19 20  Temp:  98.4 F (36.9 C)  SpO2: 100% 100%    Physical Exam Constitutional:      Appearance: He is well-developed.  HENT:     Head: Atraumatic.  Eyes:     Conjunctiva/sclera: Conjunctivae normal.  Cardiovascular:     Rate and Rhythm: Regular rhythm.  Pulmonary:      Effort: No respiratory distress.  Abdominal:     Tenderness: There is no abdominal tenderness.  Musculoskeletal:        General: Normal range of motion.     Cervical back: Normal range of motion.  Skin:    General: Skin is warm.     Capillary Refill: Capillary refill takes less than 2 seconds.  Neurological:     Mental Status: He is alert. Mental status is at baseline.     GCS: GCS eye subscore is 4. GCS verbal subscore is 5. GCS motor subscore is 6.     Sensory: Sensory deficit present.     Motor: Weakness present.     Comments: Pronator drift to the right upper extremity.  Weakness to the right upper extremity with poor grip strength when compared to the left.  Decreased sensation to the right upper extremity when compared to the left.  Patient     IMPRESSION / MDM / ASSESSMENT AND PLAN /  ED COURSE  I reviewed the triage vital signs and the nursing notes.  To the emergency department as an activated code stroke.  Last known well 30 minutes prior to arrival.  Not on anticoagulation.  Immediately taken back to CT scanner, telestroke activated  Differential diagnosis including CVA, TIA, complex migraine, dissection  EKG  I, Clotilda Punter, the attending physician, personally viewed and interpreted this ECG.  Normal sinus rhythm.  Prolonged PR interval at 218.  Narrow complex.  QTc 460.  No significant ST elevation or depression.  No findings of acute ischemia or dysrhythmia.  No tachycardic or bradycardic dysrhythmias while on cardiac telemetry.  RADIOLOGY I independently reviewed imaging, my interpretation of imaging: CT scan of the head with no signs of intracranial hemorrhage.  Discussed with radiologist read as no acute findings with normal study.  Ordered a CTA head and neck to further evaluate for possible dissection or large vessel occlusion.  After discussion with stroke neurology physician also wanted to get a CTA chest abdomen and pelvis to further rule out  dissection.  CTA head and neck and CTA chest abdomen pelvis ordered -no signs of dissection on my evaluation.  Called from radiology with no signs of dissection.  LABS (all labs ordered are listed, but only abnormal results are displayed) Labs interpreted as -    Labs Reviewed  CBC - Abnormal; Notable for the following components:      Result Value   WBC 12.4 (*)    HCT 37.1 (*)    MCV 78.9 (*)    MCHC 36.9 (*)    All other components within normal limits  COMPREHENSIVE METABOLIC PANEL WITH GFR - Abnormal; Notable for the following components:   CO2 20 (*)    Total Protein 8.2 (*)    All other components within normal limits  ETHANOL - Abnormal; Notable for the following components:   Alcohol, Ethyl (B) 101 (*)    All other components within normal limits  TROPONIN I (HIGH SENSITIVITY) - Abnormal; Notable for the following components:   Troponin I (High Sensitivity) 30 (*)    All other components within normal limits  PROTIME-INR  APTT  DIFFERENTIAL  CBG MONITORING, ED     MDM  After discussion with telemetry specialist Dr. Blair, ultimately the patient opted for TNK for acute CVA given his ongoing right-sided weakness.  No signs of dissection.  Plan for admission for MRI and post TNK evaluation.  Patient with ongoing chest pain so repeat EKG was obtained.  Initial EKG with prolonged PR interval but otherwise has normal sinus rhythm with no signs of ST elevation or depression.  Repeat EKG with no change.  Initial troponin was 30, will obtain another troponin in 2 hours.  Consulted hospitalist for admission for strokelike symptoms, TNK administration and further evaluation.     PROCEDURES:  Critical Care performed: yes  .Critical Care  Performed by: Punter Clotilda, MD Authorized by: Punter Clotilda, MD   Critical care provider statement:    Critical care time (minutes):  45   Critical care time was exclusive of:  Separately billable procedures and treating other  patients   Critical care was necessary to treat or prevent imminent or life-threatening deterioration of the following conditions:  CNS failure or compromise   Critical care was time spent personally by me on the following activities:  Development of treatment plan with patient or surrogate, discussions with consultants, evaluation of patient's response to treatment, examination of patient, ordering and review  of laboratory studies, ordering and review of radiographic studies, ordering and performing treatments and interventions, pulse oximetry, re-evaluation of patient's condition and review of old charts   Care discussed with: admitting provider     Patient's presentation is most consistent with acute presentation with potential threat to life or bodily function.   MEDICATIONS ORDERED IN ED: Medications  tenecteplase (TNKASE) injection for Stroke 25 mg (has no administration in time range)  sodium chloride  flush (NS) 0.9 % injection 3 mL (3 mLs Intravenous Given 08/17/24 0409)  iohexol  (OMNIPAQUE ) 350 MG/ML injection 150 mL (150 mLs Intravenous Contrast Given 08/17/24 0412)    FINAL CLINICAL IMPRESSION(S) / ED DIAGNOSES   Final diagnoses:  Right sided weakness  Chest pain, unspecified type     Rx / DC Orders   ED Discharge Orders     None        Note:  This document was prepared using Dragon voice recognition software and may include unintentional dictation errors.   Suzanne Kirsch, MD 08/17/24 [phone]
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
## 2024-08-17 NOTE — ED Notes (Signed)
 Tele Neuro first assessed patient.

## 2024-08-17 NOTE — Progress Notes (Signed)
   08/17/24 0400  Spiritual Encounters  Type of Visit Initial  Care provided to: Pt not available  Referral source Code page  Reason for visit Code  OnCall Visit Yes   Chaplain responded to Code Stroke at 0345. Patient at CT. No family present at time. Chaplain services available upon request.

## 2024-08-17 NOTE — ED Notes (Signed)
 Advised nurse that patient has ready bed

## 2024-08-17 NOTE — Progress Notes (Signed)
 SLP Cancellation Note  Patient Details Name: Dominic [name] MRN: [id number] DOB: 1975-11-21   Cancelled treatment:       Reason Eval/Treat Not Completed: SLP screened, no needs identified, will sign off  Pt currently oriented, > 95% speech intelligibility during phone conversation with his mother and conversation about [employer]. He is currently oriented to self, situation, location, year. He is able to follow directions and states I feel like I am getting back to normal.  Meadow Abramo B. Rubbie M.S., CCC-SLP, CBIS Speech-Language Pathologist Certified Brain Injury Specialist [facility] [phone] Ascom [phone] Fax [phone]   Zamarah Ullmer Rubbie 08/17/2024, 11:07 AM

## 2024-08-17 NOTE — Consult Note (Signed)
 [facility] Tele[facility]urology Consult Services   Patient Name:   Dominic [name], Dominic [name] Date of Birth:   09-27-1976 Identification Number:   MRN - [id number] Date of Service:   08/17/2024 03:53:17  Diagnosis:       I63.89 - Cerebrovascular accident (CVA) due to other mechanism [facility])  Impression:      48 year old left handed male with history of polysubstance abuse who presents to the hospital with right-sided weakness and chest pain. Last normal at approximately 3:20 AM. He reported onset of symptoms shortly after smoking a homemade cigarette at a gas station. Ethanol level 101 on arrival.  On examination, he had weakness in the right leg and difficulty making a fist with his right hand. No drift in the right arm. He also had numbness on the right side.  CT head with no acute findings. CTA head and neck without evidence of LVO. Obtained CTA chest given he was within the TNK window to rule out dissection with the associated chest pain and this was negative.  Tenecteplase administration reviewed with the patient. Risks, benefits, side effects, potential complications and dangers of medications discussed, including 6% risk of bleeding (anywhere in the body but also into the brain) and 30% chance of improvement in symptoms if due to a stroke. Patient wishes to utilize these agents despite risks because he felt the symptoms were disabling.  He will be admitted for post-TNK care and monitoring.    Our recommendations are outlined below. Recommendations: IV Tenecteplase recommended.  I confirmed the following. (Patient name, DOB, MRN, Blood Pressure, dose of Thrombolytic and waste, weight completed by stretcher/scale not stated weight, have ED staff inform ED MD of thrombolytic decision) Thrombolytic bolus given Without Complication.   IV Tenecteplase Total Dose - 25.0 mg (Dose Rounding Per Facility Protocol)   Routine post Thrombolytic monitoring including neuro checks and blood  pressure control during/after treatment Monitor blood pressure Check blood pressure and neuro assessment every 15 min for 2 h, then every 30 min for 6 h, and finally every hour for 16 h.  Manage Blood Pressure per post Thrombolytic protocol.        Follow designated hospital protocol for admission and post thrombolytic care       CT brain 24 hours post Thrombolytic       NPO until swallowing screen performed and passed       No antiplatelet agents or anticoagulants (including heparin for DVT prophylaxis) in first 24 hours       No Foley catheter, nasogastric tube, arterial catheter or central venous catheter for 24 hr, unless absolutely necessary       Telemetry       Bedside swallow evaluation       HOB less than 30 degrees       Euglycemia       Avoid hyperthermia, PRN acetaminophen        DVT prophylaxis       Inpatient Neurology Consultation       Stroke evaluation as per inpatient neurology recommendations  Discussed with ED physician    ------------------------------------------------------------------------------  Advanced Imaging: CTA Head and Neck Completed.  LVO:No  Patient is not a candidate for NIR   Metrics: Last Known Well: 08/17/2024 03:20:00 Dispatch Time: 08/17/2024 03:53:17 Arrival Time: 08/17/2024 03:50:00 Initial Response Time: 08/17/2024 03:58:31 Symptoms: Chest pain, R arm numbness. Initial patient interaction: 08/17/2024 04:00:10 NIHSS Assessment Completed: 08/17/2024 04:25:00 Patient is a candidate for Thrombolytic. Thrombolytic Medical Decision: 08/17/2024 04:31:46 Needle Time: 08/17/2024 04:43:00 Weight Noted  by Staff: 100.4 kg  CT Head: I personally reviewed all the CT images that were available to me and it showed: no acute intracranial process  Primary Provider Notified of Diagnostic Impression and Management Plan on: 08/17/2024 04:44:36    ------------------------------------------------------------------------------  Thrombolytic  Contraindications:  Last Known Well > 4.5 hours: No CT Head showing hemorrhage: No Ischemic stroke within 3 months: No Severe head trauma within 3 months: No Intracranial/intraspinal surgery within 3 months: No History of intracranial hemorrhage: No Symptoms and signs consistent with an SAH: No GI malignancy or GI bleed within 21 days: No Coagulopathy: Platelets <100 000 /mm3, INR >1.7, aPTT>40 s, or PT >15 s: No Treatment dose of LMWH within the previous 24 hrs: No Use of NOACs in past 48 hours: No Glycoprotein IIb/IIIa receptor inhibitors use: No Symptoms consistent with infective endocarditis: No Suspected aortic arch dissection: No Intra-axial intracranial neoplasm: No  Thrombolytic Decision and Management Plan: Management with thrombolytic treatment was explained to the Patient as was risks and benefits and alternatives to the treatment. Patient agrees with the decision to proceed with thrombolytic treatment. . All questions were answered and the Patient expressed understanding of the treatment plan.   History of Present Illness: Patient is a 48 year old Male.  Patient was brought by EMS for symptoms of Chest pain, R arm numbness. EMS called out for R sided weakness and chest pain. Sudden onset outside a gas station earlier this morning. Patient lives at a halfway house [place] hiked to a gas station earlier today and smoked a homemade cigarette and then developed these symptoms shortly afterwards.  Ethanol level is 101.   Past Medical History:      There is no history of Hypertension      There is no history of Diabetes Mellitus      There is no history of Hyperlipidemia      There is no history of Atrial Fibrillation      There is no history of Stroke Other PMH:  cocaine abuse, meth abuse, alcohol abuse, schizoaffective disorder  Medications:  No Anticoagulant use  No Antiplatelet use Reviewed EMR for current medications  Allergies:  Reviewed  Social  History: Smoking: No Alcohol Use: Yes Drug Use: Yes  Family History:  There is no family history of premature cerebrovascular disease pertinent to this consultation  ROS : 14 Points Review of Systems was performed and was negative except mentioned in HPI.  Past Surgical History: There Is No Surgical History Contributory To Today's Visit   Examination: BP(145/96), Pulse(82), Blood Glucose(82) 1A: Level of Consciousness - Alert; keenly responsive + 0 1B: Ask Month and Age - Both Questions Right + 0 1C: Blink Eyes & Squeeze Hands - Performs Both Tasks + 0 2: Test Horizontal Extraocular Movements - Normal + 0 3: Test Visual Fields - No Visual Loss + 0 4: Test Facial Palsy (Use Grimace if Obtunded) - Normal symmetry + 0 5A: Test Left Arm Motor Drift - No Drift for 10 Seconds + 0 5B: Test Right Arm Motor Drift - No Drift for 10 Seconds + 0 6A: Test Left Leg Motor Drift - No Drift for 5 Seconds + 0 6B: Test Right Leg Motor Drift - Drift, hits bed + 2 7: Test Limb Ataxia (FNF/Heel-Shin) - No Ataxia + 0 8: Test Sensation - Mild-Moderate Loss: Less Sharp/More Dull + 1 9: Test Language/Aphasia - Normal; No aphasia + 0 10: Test Dysarthria - Normal + 0 11: Test Extinction/Inattention - No abnormality +  0  NIHSS Score: 3 NIHSS Free Text : numbness on the right hand. difficulty making a fist or doing fine movements with the right hand.  Pre-Morbid Modified Rankin Scale: 0 Points = No symptoms at all  Spoke with : Dr. Suzanne I reviewed the available imaging via Rapid and initiated discussion with the primary provider  This consult was conducted in real time using interactive audio and video technology. Patient was informed of the technology being used for this visit and agreed to proceed. Patient located in hospital and provider located at home/office setting.   Patient is being evaluated for possible acute neurologic impairment and high probability of imminent or life-threatening  deterioration. I spent total of 35 minutes providing care to this patient, including time for face to face visit via telemedicine, review of medical records, imaging studies and discussion of findings with providers, the patient and/or family.     Dr Zelda Kluver   [facility] For Inpatient follow-up with [facility] physician please call [facility] at [phone]. As we are not an outpatient service for any post hospital discharge needs please contact the hospital for assistance.  If you have any questions for the [facility] physicians or need to reconsult for clinical or diagnostic changes please contact us  via [facility] at [phone].   Signature : Savion Washam Burnett
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
## 2024-08-18 ENCOUNTER — Inpatient Hospital Stay: Payer: Self-pay

## 2024-08-18 ENCOUNTER — Other Ambulatory Visit: Payer: Self-pay

## 2024-08-18 DIAGNOSIS — I639 Cerebral infarction, unspecified: Secondary | ICD-10-CM

## 2024-08-18 DIAGNOSIS — F1910 Other psychoactive substance abuse, uncomplicated: Secondary | ICD-10-CM | POA: Diagnosis not present

## 2024-08-18 DIAGNOSIS — I6381 Other cerebral infarction due to occlusion or stenosis of small artery: Secondary | ICD-10-CM | POA: Diagnosis not present

## 2024-08-18 DIAGNOSIS — R29818 Other symptoms and signs involving the nervous system: Secondary | ICD-10-CM | POA: Diagnosis not present

## 2024-08-18 LAB — RENAL FUNCTION PANEL
Albumin: 3.4 g/dL — ABNORMAL LOW (ref 3.5–5.0)
Anion gap: 12 (ref 5–15)
BUN: 15 mg/dL (ref 6–20)
CO2: 21 mmol/L — ABNORMAL LOW (ref 22–32)
Calcium: 8.3 mg/dL — ABNORMAL LOW (ref 8.9–10.3)
Chloride: 106 mmol/L (ref 98–111)
Creatinine, Ser: 0.76 mg/dL (ref 0.61–1.24)
GFR, Estimated: >60 mL/min (ref >60)
Glucose, Bld: 98 mg/dL (ref 70–99)
Phosphorus: 2.7 mg/dL (ref 2.5–4.6)
Potassium: 3.6 mmol/L (ref 3.5–5.1)
Sodium: 139 mmol/L (ref 135–145)

## 2024-08-18 LAB — CBC
HCT: 34.6 % — ABNORMAL LOW (ref 39.0–52.0)
Hemoglobin: 12.6 g/dL — ABNORMAL LOW (ref 13.0–17.0)
MCH: 29.2 pg (ref 26.0–34.0)
MCHC: 36.4 g/dL — ABNORMAL HIGH (ref 30.0–36.0)
MCV: 80.1 fL (ref 80.0–100.0)
Platelets: 155 K/uL (ref 150–400)
RBC: 4.32 MIL/uL (ref 4.22–5.81)
RDW: 13.7 % (ref 11.5–15.5)
WBC: 6.9 K/uL (ref 4.0–10.5)
nRBC: 0 % (ref 0.0–0.2)

## 2024-08-18 LAB — LIPID PANEL
Cholesterol: 159 mg/dL (ref 0–200)
HDL: 49 mg/dL (ref >40)
LDL Cholesterol: 99 mg/dL (ref 0–99)
Total CHOL/HDL Ratio: 3.2 ratio
Triglycerides: 54 mg/dL (ref <150)
VLDL: 11 mg/dL (ref 0–40)

## 2024-08-18 LAB — MAGNESIUM: Magnesium: 2.1 mg/dL (ref 1.7–2.4)

## 2024-08-18 MED ORDER — CLOPIDOGREL BISULFATE 75 MG PO TABS
75.0000 mg | ORAL_TABLET | Freq: Every day | ORAL | 0 refills | Status: AC
  Filled 2024-08-18: qty 21, 21d supply, fill #0

## 2024-08-18 MED ORDER — CLOPIDOGREL BISULFATE 75 MG PO TABS: 75.0000 mg | ORAL_TABLET | Freq: Every day | ORAL | Status: DC

## 2024-08-18 MED ORDER — ATORVASTATIN CALCIUM 20 MG PO TABS
40.0000 mg | ORAL_TABLET | Freq: Every day | ORAL | Status: DC
  Administered 2024-08-18: 40 mg via ORAL
  Filled 2024-08-18: qty 2

## 2024-08-18 MED ORDER — CLOPIDOGREL BISULFATE 75 MG PO TABS
300.0000 mg | ORAL_TABLET | Freq: Once | ORAL | Status: AC
Start: 2024-08-18 — End: 2024-08-18
  Administered 2024-08-18: 300 mg via ORAL
  Filled 2024-08-18: qty 4

## 2024-08-18 MED ORDER — ATORVASTATIN CALCIUM 40 MG PO TABS
40.0000 mg | ORAL_TABLET | Freq: Every day | ORAL | 1 refills | Status: AC
  Filled 2024-08-18: qty 90, 90d supply, fill #0

## 2024-08-18 MED ORDER — ASPIRIN 81 MG PO TBEC
81.0000 mg | DELAYED_RELEASE_TABLET | Freq: Every day | ORAL | Status: DC
  Administered 2024-08-18: 81 mg via ORAL
  Filled 2024-08-18: qty 1

## 2024-08-18 MED ORDER — ASPIRIN 81 MG PO TBEC
81.0000 mg | DELAYED_RELEASE_TABLET | Freq: Every day | ORAL | 3 refills | Status: AC
  Filled 2024-08-18: qty 90, 90d supply, fill #0

## 2024-08-18 NOTE — Progress Notes (Signed)
 Introduced patient to role of Statistician. Patient was released from prison 3 days ago. Patient states he was released to a halfway house in [place], but decided to [employer] hike back to [facility] (where he was originally from).  Patient requesting contact information for his parole officer. Secure e-mail sent asking for contact information.  Patient is currently UNHOUSED. Agreeable to going to a shelter. At this time leaning towards [facility]. Patient aware his criminal record and hx of drug use may be a barrier to this. Patient provided with clothing from clothing closet, Garment/textile technologist referral, information for [employer] [facility], [employer], [employer] and area food banks/served meals.  Patient states he thinks he used to have Medicaid but that is may have been suspended while incarcerated. Referral sent to financial navigator.  Patient encouraged to call with any questions or concerns.

## 2024-08-18 NOTE — Evaluation (Signed)
 Physical Therapy Evaluation Patient Details Name: Dominic [name] MRN: [id number] DOB: April 14, 1976 Today's Date: 08/18/2024  History of Present Illness  Pt is a 48 y/o M presenting to ED after an episode of falling out at a gas station with R sided weakness. Imaging negative for acute intracranial abnormality. Pt received TNKase in ED, follow-up imaging negative. PMH significant for schizoaffective disorder, polysubstance abuse, anxiety, asthma, BPD, chronic hepatitis C, sleep apnea, malingering. MD assessment includes chest pain due to cocaine use, acute stroke s/p TNKase.   Clinical Impression  Pt A&Ox4, agreeable to participate in PT evaluation and put forth good effort throughout. At baseline, pt is IND with mobility/ADLs, denies previous AD use and hx of falls. Pt was met semi-supine in bed, modI with bed mobility with intermittent use of bed features. STS from EOB with RW and CGA, max VC for proper hand placement. Pt amb ~375ft total, initially utilizing RW, trial of amb with no AD however pt was visibly more unsteady and demonstrated increased drift to L with no AD, no overt LOB throughout amb. Additional bout of amb with SPC, min verbal and visual cues for sequencing with pt demonstrating good carryover. Pt was left seated in recliner at end of session, all needs in reach. Pt would benefit from skilled PT intervention to address listed deficits (see PT Problem List) and allow for safe return to PLOF.       If plan is discharge home, recommend the following: A little help with walking and/or transfers;A little help with bathing/dressing/bathroom;Assist for transportation   Can travel by private vehicle        Equipment Recommendations Cane  Recommendations for Other Services       Functional Status Assessment Patient has had a recent decline in their functional status and demonstrates the ability to make significant improvements in function in a reasonable and predictable amount of  time.     Precautions / Restrictions Precautions Precautions: Fall Recall of Precautions/Restrictions: Impaired Restrictions Weight Bearing Restrictions Per Provider Order: No      Mobility  Bed Mobility Overal bed mobility: Modified Independent             General bed mobility comments: no physical assistance required, intermittent use of bed features    Transfers Overall transfer level: Needs assistance Equipment used: Rolling walker (2 wheels) Transfers: Sit to/from Stand Sit to Stand: Contact guard assist           General transfer comment: STS from EOB with CGA, VC for hand placement on RW    Ambulation/Gait Ambulation/Gait assistance: Contact guard assist Gait Distance (Feet): 300 Feet (over 2 bouts) Assistive device: Rolling walker (2 wheels), Straight cane, None Gait Pattern/deviations: Step-through pattern, Decreased stride length, Narrow base of support Gait velocity: decreased     General Gait Details: Pt amb initially with RW, progressed to no AD but was visibly more unsteady without AD and had tendency to drift to L. Amb additional 191ft with SPC with min verbal cues for sequencing  Stairs            Wheelchair Mobility     Tilt Bed    Modified Rankin (Stroke Patients Only)       Balance Overall balance assessment: Needs assistance Sitting-balance support: Feet supported Sitting balance-Leahy Scale: Normal Sitting balance - Comments: steady static and dynamic sitting, able to don socks while sitting EOB   Standing balance support: Bilateral upper extremity supported, Single extremity supported, No upper extremity supported Standing balance-Leahy Scale:  Fair Standing balance comment: more unsteady in standing without UE support                             Pertinent Vitals/Pain Pain Assessment Pain Assessment: No/denies pain    Home Living Family/patient expects to be discharged to:: Shelter/Homeless                    Additional Comments: pt is currently homeless, just got out of prison 3 days ago, unsure of where he is going at d/c.    Prior Function Prior Level of Function : Independent/Modified Independent             Mobility Comments: pt denies hx of AD use, no recent falls ADLs Comments: IND with ADLs     Extremity/Trunk Assessment   Upper Extremity Assessment Upper Extremity Assessment: Defer to OT evaluation;Left hand dominant    Lower Extremity Assessment Lower Extremity Assessment: RLE deficits/detail;LLE deficits/detail RLE Deficits / Details: gross weakness of 3+/5 with formal MMT testing of LE, heel to shin and alternating toe taps WFL. Functional strength appeared grossly WFL, able to perform STS and march in place with no LE buckling. RLE Sensation: WNL RLE Coordination: WNL LLE Deficits / Details: grossly WFL 5/5 strength with MMT, heel to shin and alternating toe taps WFL LLE Sensation: WNL LLE Coordination: WNL    Cervical / Trunk Assessment Cervical / Trunk Assessment: Normal  Communication   Communication Communication: No apparent difficulties    Cognition Arousal: Alert Behavior During Therapy: WFL for tasks assessed/performed   PT - Cognitive impairments: No apparent impairments                       PT - Cognition Comments: A&Ox4, cooperative throughout session Following commands: Intact       Cueing Cueing Techniques: Verbal cues, Visual cues     General Comments      Exercises     Assessment/Plan    PT Assessment Patient needs continued PT services  PT Problem List Decreased strength;Decreased activity tolerance;Decreased balance;Decreased mobility;Decreased knowledge of use of DME       PT Treatment Interventions DME instruction;Gait training;Stair training;Functional mobility training;Therapeutic activities;Therapeutic exercise;Balance training;Neuromuscular re-education;Patient/family education    PT Goals (Current  goals can be found in the Care Plan section)  Acute Rehab PT Goals Patient Stated Goal: to get better PT Goal Formulation: With patient Time For Goal Achievement: 09/01/24 Potential to Achieve Goals: Good    Frequency Min 2X/week     Co-evaluation               AM-PAC PT 6 Clicks Mobility  Outcome Measure Help needed turning from your back to your side while in a flat bed without using bedrails?: None Help needed moving from lying on your back to sitting on the side of a flat bed without using bedrails?: A Little Help needed moving to and from a bed to a chair (including a wheelchair)?: A Little Help needed standing up from a chair using your arms (e.g., wheelchair or bedside chair)?: A Little Help needed to walk in hospital room?: A Little Help needed climbing 3-5 steps with a railing? : A Little 6 Click Score: 19    End of Session Equipment Utilized During Treatment: Gait belt Activity Tolerance: Patient tolerated treatment well Patient left: in chair;with call bell/phone within reach;with chair alarm set Nurse Communication: Mobility status PT Visit Diagnosis:  Unsteadiness on feet (R26.81);Other abnormalities of gait and mobility (R26.89);Muscle weakness (generalized) (M62.81);Difficulty in walking, not elsewhere classified (R26.2)    Time: 9091-9073 (and 954 to 1000) PT Time Calculation (min) (ACUTE ONLY): 18 min   Charges:   PT Evaluation $PT Eval Moderate Complexity: 1 Mod PT Treatments $Gait Training: 8-22 mins PT General Charges $$ ACUTE PT VISIT: 1 Visit         Janell Axe, SPT

## 2024-08-18 NOTE — Evaluation (Signed)
 Occupational Therapy Evaluation Patient Details Name: Dominic [name] MRN: [id number] DOB: [birth year] Today's Date: 08/18/2024   History of Present Illness   Pt is a 48 y/o M presenting to ED after an episode of falling out at a gas station with R sided weakness. Imaging negative for acute intracranial abnormality. Pt received TNKase in ED, follow-up imaging negative. PMH significant for schizoaffective disorder, polysubstance abuse, anxiety, asthma, BPD, chronic hepatitis C, sleep apnea, malingering. MD assessment includes chest pain due to cocaine use, acute stroke s/p TNKase.     Clinical Impressions Patient presenting with decreased Ind in self care,balance, functional mobility/transfers, endurance, and safety awareness. Patient reports being Ind at baseline and currently being homeless after getting out of prison severa; days ago. Pt endorsing weakness but formal testing does not align with functional abilities during this session. This therapist concerned with pt's ability to get on/off of ground or low cot at shelter. Pt stands from EOB with supervision and demonstrates ability to get onto bottoms on fall mat and then onto knees and stands pushing up from bed without assistance. Pt not having difficulty with donning/doffing B socks. Pt having just practiced using RW and SPC for mobility prior to therapist arrival.  Patient will benefit from acute OT to increase overall independence in the areas of ADLs, functional mobility, and safety awareness in order to safely discharge.     If plan is discharge home, recommend the following:         Functional Status Assessment   Patient has had a recent decline in their functional status and demonstrates the ability to make significant improvements in function in a reasonable and predictable amount of time.       Precautions/Restrictions   Precautions Precautions: Fall     Mobility Bed Mobility Overal bed mobility: Modified  Independent                  Transfers Overall transfer level: Needs assistance Equipment used: None Transfers: Sit to/from Stand Sit to Stand: Supervision                  Balance Overall balance assessment: Needs assistance Sitting-balance support: Feet supported Sitting balance-Leahy Scale: Normal                                     ADL either performed or assessed with clinical judgement   ADL                                         General ADL Comments: supervision     Vision Patient Visual Report: No change from baseline              Pertinent Vitals/Pain Pain Assessment Pain Assessment: No/denies pain     Extremity/Trunk Assessment Upper Extremity Assessment Upper Extremity Assessment: Generalized weakness;Overall WFL for tasks assessed   Lower Extremity Assessment Lower Extremity Assessment: Defer to PT evaluation RLE Deficits / Details: gross weakness of 3+/5 with formal MMT testing of LE, heel to shin and alternating toe taps WFL. Functional strength appeared grossly WFL, able to perform STS and march in place with no LE buckling. RLE Sensation: WNL RLE Coordination: WNL LLE Deficits / Details: grossly WFL 5/5 strength with MMT, heel to shin and alternating toe taps WFL LLE Sensation: WNL LLE Coordination:  WNL   Cervical / Trunk Assessment Cervical / Trunk Assessment: Normal   Communication Communication Communication: No apparent difficulties   Cognition Arousal: Alert Behavior During Therapy: WFL for tasks assessed/performed Cognition: No apparent impairments                               Following commands: Intact       Cueing  General Comments   Cueing Techniques: Verbal cues;Visual cues              Home Living Family/patient expects to be discharged to:: Shelter/Homeless                                 Additional Comments: pt is currently homeless, just  got out of prison 3 days ago, unsure of where he is going at d/c.      Prior Functioning/Environment Prior Level of Function : Independent/Modified Independent             Mobility Comments: pt denies hx of AD use, no recent falls ADLs Comments: IND with ADLs and IADLs    OT Problem List: Decreased strength;Impaired balance (sitting and/or standing);Decreased safety awareness;Decreased activity tolerance   OT Treatment/Interventions: Self-care/ADL training;Therapeutic exercise;Patient/family education;Balance training;Energy conservation;Therapeutic activities      OT Goals(Current goals can be found in the care plan section)   Acute Rehab OT Goals Patient Stated Goal: to get stronger OT Goal Formulation: With patient Time For Goal Achievement: 09/01/24 Potential to Achieve Goals: Fair ADL Goals Pt Will Perform Grooming: with modified independence;standing Pt Will Perform Lower Body Dressing: with modified independence;sit to/from stand Pt Will Transfer to Toilet: with modified independence;ambulating Pt Will Perform Toileting - Clothing Manipulation and hygiene: with modified independence;sit to/from stand   OT Frequency:  Min 2X/week       AM-PAC OT 6 Clicks Daily Activity     Outcome Measure Help from another person eating meals?: None Help from another person taking care of personal grooming?: None Help from another person toileting, which includes using toliet, bedpan, or urinal?: None Help from another person bathing (including washing, rinsing, drying)?: None Help from another person to put on and taking off regular upper body clothing?: A Little Help from another person to put on and taking off regular lower body clothing?: A Little 6 Click Score: 22   End of Session    Activity Tolerance: Patient tolerated treatment well Patient left: in bed  OT Visit Diagnosis: Unsteadiness on feet (R26.81);Muscle weakness (generalized) (M62.81)                Time:  8989-8978 OT Time Calculation (min): 11 min Charges:  OT General Charges $OT Visit: 1 Visit OT Evaluation $OT Eval Low Complexity: 1 Low  Izetta Claude, MS, OTR/L , CBIS ascom [phone]  08/18/24, 12:30 PM

## 2024-08-18 NOTE — Progress Notes (Signed)
Per Dr Posey Pronto, d.c tele monitoring

## 2024-08-18 NOTE — Plan of Care (Signed)
  Problem: Activity: Goal: Risk for activity intolerance will decrease Outcome: Progressing   Problem: Education: Goal: Knowledge of disease or condition will improve Outcome: Progressing Goal: Knowledge of secondary prevention will improve (MUST DOCUMENT ALL) Outcome: Progressing Goal: Knowledge of patient specific risk factors will improve (DELETE if not current risk factor) Outcome: Progressing   Problem: Ischemic Stroke/TIA Tissue Perfusion: Goal: Complications of ischemic stroke/TIA will be minimized Outcome: Progressing   Problem: Coping: Goal: Will verbalize positive feelings about self Outcome: Progressing Goal: Will identify appropriate support needs Outcome: Progressing   Problem: Health Behavior/Discharge Planning: Goal: Ability to manage health-related needs will improve Outcome: Progressing Goal: Goals will be collaboratively established with patient/family Outcome: Progressing   Problem: Self-Care: Goal: Ability to participate in self-care as condition permits will improve Outcome: Progressing Goal: Verbalization of feelings and concerns over difficulty with self-care will improve Outcome: Progressing Goal: Ability to communicate needs accurately will improve Outcome: Progressing   Problem: Nutrition: Goal: Risk of aspiration will decrease Outcome: Progressing Goal: Dietary intake will improve Outcome: Progressing   Problem: Health Behavior/Discharge Planning: Goal: Ability to manage health-related needs will improve Outcome: Progressing   Problem: Clinical Measurements: Goal: Ability to maintain clinical measurements within normal limits will improve Outcome: Progressing Goal: Will remain free from infection Outcome: Progressing Goal: Diagnostic test results will improve Outcome: Progressing Goal: Respiratory complications will improve Outcome: Progressing Goal: Cardiovascular complication will be avoided Outcome: Progressing   Problem:  Activity: Goal: Risk for activity intolerance will decrease Outcome: Progressing   Problem: Nutrition: Goal: Adequate nutrition will be maintained Outcome: Progressing   Problem: Coping: Goal: Level of anxiety will decrease Outcome: Progressing   Problem: Elimination: Goal: Will not experience complications related to bowel motility Outcome: Progressing Goal: Will not experience complications related to urinary retention Outcome: Progressing   Problem: Pain Managment: Goal: General experience of comfort will improve and/or be controlled Outcome: Progressing   Problem: Safety: Goal: Ability to remain free from injury will improve Outcome: Progressing   Problem: Skin Integrity: Goal: Risk for impaired skin integrity will decrease Outcome: Progressing

## 2024-08-18 NOTE — Discharge Instructions (Signed)
 Abstain from drinking alcohol and using cocaine

## 2024-08-18 NOTE — TOC Transition Note (Addendum)
 Transition of Care [facility]) - Discharge Note   Patient Details  Name: Dominic [name] MRN: [id number] Date of Birth: 1976/02/23  [facility]) CM/SW Contact:  Dalia GORMAN Fuse, RN Phone Number: 08/18/2024, 12:16 PM   Clinical Narrative:    TOC spoke with the patient and he would like to go to [facility]. TOC provided a taxi voucher for transport and a cane from charity care.    Final next level of care: Homeless Shelter [facility]) Barriers to Discharge: Barriers Resolved   Patient Goals and CMS Choice            Discharge Placement                       Discharge Plan and Services Additional resources added to the After Visit Summary for                                       Social Drivers of Health (SDOH) Interventions SDOH Screenings   Food Insecurity: Food Insecurity Present (08/17/2024)  Housing: High Risk (08/17/2024)  Transportation Needs: Unmet Transportation Needs (08/17/2024)  Utilities: Not At Risk (08/17/2024)  Alcohol Screen: Low Risk  (09/08/2022)  Depression (PHQ2-9): High Risk (04/19/2022)  Financial Resource Strain: Low Risk  (03/31/2022)   Received from [facility]  Physical Activity: Inactive (11/25/2018)  Social Connections: Unknown (03/18/2022)   Received from [facility]  Stress: No Stress Concern Present (11/25/2018)  Tobacco Use: High Risk (04/18/2023)     Readmission Risk Interventions     No data to display

## 2024-08-18 NOTE — Progress Notes (Signed)
 NEUROLOGY CONSULT FOLLOW UP NOTE   Date of service: August 18, 2024 Patient Name: Dominic [name] MRN:  [id number] DOB:  [birth year]  Interval Hx/subjective  Other than mild hand weakness, he feels completely back to normal  Vitals   Vitals:   08/18/24 0400 08/18/24 0431 08/18/24 0500 08/18/24 0622  BP:  110/72 (!) 105/59 112/77  Pulse:  (!) 52 (!) 57 (!) 51  Resp:  15 13 20   Temp: 97.8 F (36.6 C)   98.5 F (36.9 C)  TempSrc: Oral     SpO2:  98%  98%  Weight:      Height:         Body mass index is 30.47 kg/m.  Physical Exam   General: Awake alert in no distress HEENT: Normocephalic atraumatic Lungs: Clear Cardiovascular: Regular rate rhythm Neurological exam Awake alert oriented x 3 No dysarthria No aphasia Cranial nerves II to XII intact Motor examination with no drift in the right upper extremity but he is definitely weak with 4/5 strength at the right upper extremity without much of a vertical drift.  His right grip strength is weak.  Right lower extremity gives way.  left side is full strength. Sensation is intact to light touch without extinction Coordination examination reveals no dysmetria  Medications  Current Facility-Administered Medications:    acetaminophen  (TYLENOL ) tablet 650 mg, 650 mg, Oral, Q4H PRN, 650 mg at 08/17/24 0854 **OR** acetaminophen  (TYLENOL ) 160 MG/5ML solution 650 mg, 650 mg, Per Tube, Q4H PRN **OR** acetaminophen  (TYLENOL ) suppository 650 mg, 650 mg, Rectal, Q4H PRN, Arora, Ashish, MD   Chlorhexidine Gluconate Cloth 2 % PADS 6 each, 6 each, Topical, Q0600, Kasa, Kurian, MD, 6 each at 08/17/24 1020   Oral care mouth rinse, 15 mL, Mouth Rinse, PRN, Isaiah, Kurian, MD   senna-docusate (Senokot-S) tablet 1 tablet, 1 tablet, Oral, QHS PRN, [place], MD  Labs and Diagnostic Imaging   CBC:  Recent Labs  Lab 08/17/24 0351 08/18/24 0554  WBC 12.4* 6.9  NEUTROABS 7.4  --   HGB 13.7 12.6*  HCT 37.1* 34.6*  MCV 78.9* 80.1  PLT  180 155    Basic Metabolic Panel:  Lab Results  Component Value Date   NA 139 08/18/2024   K 3.6 08/18/2024   CO2 21 (L) 08/18/2024   GLUCOSE 98 08/18/2024   BUN 15 08/18/2024   CREATININE 0.76 08/18/2024   CALCIUM 8.3 (L) 08/18/2024   GFRNONAA >60 08/18/2024   GFRAA >60 07/31/2020   Lipid Panel:  Lab Results  Component Value Date   LDLCALC 99 08/18/2024   HgbA1c:  Lab Results  Component Value Date   HGBA1C 5.1 08/17/2024   Urine Drug Screen:     Component Value Date/Time   LABOPIA NONE DETECTED 08/17/2024 0435   LABOPIA NONE DETECTED 04/16/2022 1625   COCAINSCRNUR POSITIVE (A) 08/17/2024 0435   LABBENZ NONE DETECTED 08/17/2024 0435   LABBENZ POSITIVE (A) 04/16/2022 1625   AMPHETMU NONE DETECTED 08/17/2024 0435   AMPHETMU POSITIVE (A) 04/16/2022 1625   THCU NONE DETECTED 08/17/2024 0435   THCU POSITIVE (A) 04/16/2022 1625   LABBARB NONE DETECTED 08/17/2024 0435   LABBARB NONE DETECTED 04/16/2022 1625    Alcohol Level     Component Value Date/Time   ETH 101 (H) 08/17/2024 0351   INR  Lab Results  Component Value Date   INR 1.0 08/17/2024   APTT  Lab Results  Component Value Date   APTT 25 08/17/2024  Urinary toxicology screen  positive for cocaine  CT Head without contrast(Personally reviewed): No acute intracranial process.  Aspects 10.  No bleed  CT angio Head and Neck with contrast(Personally reviewed): No acute intracranial abnormality.  Mild bilateral level 1 and level 2 cervical lymphadenopathy.  Relative hypertrophy of the right sternocleidomastoid.  CT angio chest abdomen pelvis dissection protocol negative for dissection  Assessment   Jakim Kelsey Willmon is a 48 y.o. male past history of polysubstance abuse presented to the hospital for evaluation of chest pain and right-sided weakness with a last known well at 3:20 AM.  Though MRI is negative, with mostly resolved symptoms, my suspicion is that this was cerebrovascular in nature.  He does have  evidence of some chronic ischemic white matter change, and I suspect that he likely has an area that is tenuous whose blood supply was further compromised to do the vasoconstriction associated with cocaine resulting in his temporary symptoms.  I strongly advised him to stop doing cocaine, but I would also address his small vessel risk factors including hyperlipidemia and cigarette use.  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 Neurology will be available as needed  Aisha Seals, MD [facility]   If 7pm- 7am, please page neurology on call as listed in AMION.

## 2024-08-18 NOTE — Progress Notes (Signed)
 Received phone call from patient's parole officer, Officer Peri [phone]). Patient needs to report to parole office within 24 hours of d/c from hospital. Patient provided with contact information and informed of request for appearance. Verbalized understanding.

## 2024-08-18 NOTE — Discharge Summary (Signed)
 Physician Discharge Summary   Patient: [name] MRN: [id number] DOB: [birth year]  Admit date:     08/17/2024  Discharge date: 08/18/24  Discharge Physician: Leita Blanch   PCP: Pcp, No   Recommendations at discharge:    [facility]abl[facility] Abstain from drinking alcohol and doing cocaine  Discharge Diagnoses: Principal Problem:   Acute CVA (cerebrovascular accident) [facility]) Active Problems:   Acute ischemic stroke (HCC)   Per H and p Dominic [name] is a 48 y.o. male past medical history significant for schizoaffective disorder, polysubstance use disorder, who presents to the emergency department as an activated code stroke.  Patient's last known well was 30 minutes prior to arrival.  States that he was at a halfway house and walked to the gas station and smoked a home rolled cigarette that another person at the gas station gave him.  Afterwards developed left-sided chest pain.  When EMS arrived for left-sided chest pain noted that he had no grip strength and weakness to the right upper extremity. Code stroke was acitvated and pt received TNK.  CT head with no acute findings. CTA head and neck without evidence of LVO  MRI brain No acute intracranial abnormality. 2. Scattered bilateral subcortical white matter T2 hyperintensities, nonspecific.3. Mucosal disease of the maxillary sinuses bilaterally. Echo EF 60-65%  Right upper and lower extremity weakness likely secondary to pure motor lacunar stroke in the setting of polysubstance abuse/cocaine/MRI neg stroke (as per Neurology) -- patient is status post TNKnase -- repeat CT head negative  -- MRI negative for stroke -- ambulated with PT did well using a cane. -- Neurology recommends aspirin  plus Plavix for 21 days and then continue aspirin  daily -- continue statins  Hyperlipidemia -- started statins  Polysubstance abuse -- UDS positive for cocaine -- ethanol level 101 -- patient advised abstinence -- nurse  navigator to give resources for outpatient help since patient just got out of prison past Friday  D/c to shelter vs [facility] home in [place]       Consultants: Neurology  Disposition: Shelter Diet recommendation:  Discharge Diet Orders (From admission, onward)     Start     Ordered   08/18/24 0000  Diet - low sodium heart healthy        08/18/24 1208           Cardiac diet DISCHARGE MEDICATION: Allergies as of 08/18/2024   No Known Allergies      Medication List     STOP taking these medications    paliperidone  6 MG 24 hr tablet Commonly known as: INVEGA    traZODone  50 MG tablet Commonly known as: DESYREL        TAKE these medications    aspirin  EC 81 MG tablet Take 1 tablet (81 mg total) by mouth daily. Swallow whole.   atorvastatin 40 MG tablet Commonly known as: LIPITOR Take 1 tablet (40 mg total) by mouth daily.   clopidogrel 75 MG tablet Commonly known as: Plavix Take 1 tablet (75 mg total) by mouth daily for 21 days.        Follow-up Information     [facility]. Go in 1 week(s).   Why: to establish Primary care               Discharge Exam: Filed Weights   08/17/24 0432 08/17/24 0826  Weight: 100.4 kg 99.1 kg  alert and oriented times respiratory clear to auscultation cardiovascular both heart sounds normal neuro- alert and awake.  Mild weakness right upper extremity 4/5 Condition at discharge: fair  The results of significant diagnostics from this hospitalization (including imaging, microbiology, ancillary and laboratory) are listed below for reference.   Imaging Studies: CT HEAD WO CONTRAST ( ) Result Date: 08/18/2024 EXAM: CT HEAD WITHOUT CONTRAST 08/18/2024 07:08:13 AM TECHNIQUE: CT of the head was performed without the administration of intravenous contrast. Automated exposure control, iterative reconstruction, and/or weight based adjustment of the mA/kV was utilized to reduce the radiation dose to as low as reasonably  achievable. COMPARISON: MRI of the head dated 08/18/2024. CLINICAL HISTORY: Stroke, follow up. FINDINGS: BRAIN AND VENTRICLES: No acute hemorrhage. No evidence of acute infarct. No hydrocephalus. No extra-axial collection. No mass effect or midline shift. ORBITS: No acute abnormality. SINUSES: There is mild mucosal disease in the right maxillary sinus. SOFT TISSUES AND SKULL: No acute soft tissue abnormality. No skull fracture. IMPRESSION: 1. No acute intracranial abnormality. 2. Mild mucosal disease in the right maxillary sinus. Electronically signed by: Evalene Coho MD 08/18/2024 07:56 AM EDT RP Workstation: [id number]   MR BRAIN WO CONTRAST Result Date: 08/18/2024 EXAM: MRI BRAIN WITHOUT CONTRAST 08/18/2024 04:20:00 AM TECHNIQUE: Multiplanar multisequence MRI of the head/brain was performed without the administration of intravenous contrast. COMPARISON: CT of the head dated 08/17/2024. CLINICAL HISTORY: Stroke, follow up. F/u stroke; post tnk. FINDINGS: BRAIN AND VENTRICLES: No acute infarct. No intracranial hemorrhage. No mass. No midline shift. No hydrocephalus. There are multiple foci of increased T2 signal within the subcortical white matter of the cerebral hemispheres bilaterally. The sella is unremarkable. Normal flow voids. ORBITS: No acute abnormality. SINUSES AND MASTOIDS: There is mucosal disease present within the maxillary sinuses bilaterally. BONES AND SOFT TISSUES: Normal marrow signal. No acute soft tissue abnormality. IMPRESSION: 1. No acute intracranial abnormality. 2. Scattered bilateral subcortical white matter T2 hyperintensities, nonspecific. 3. Mucosal disease of the maxillary sinuses bilaterally. Electronically signed by: Evalene Coho MD 08/18/2024 04:31 AM EDT RP Workstation: [id number]   ECHOCARDIOGRAM COMPLETE Result Date: 08/17/2024    ECHOCARDIOGRAM REPORT   Patient Name:   Dominic [name] Date of Exam: 08/17/2024 Medical Rec #:  [id number]          Height:       71.0 in  Accession #:    [id number]         Weight:       218.5 lb Date of Birth:  10-17-1976          BSA:          2.189 m Patient Age:    48 years           BP:           126/77 mmHg Patient Gender: M                  HR:           76 bpm. Exam Location:  [facility] Procedure: 2D Echo, Cardiac Doppler and Color Doppler (Both Spectral and Color            Flow Doppler were utilized during procedure). Indications:     Stroke I63.9  History:         Patient has no prior history of Echocardiogram examinations.  Sonographer:     Bernice Rubinstein RDCS Referring Phys:  [id number] ASHISH ARORA Diagnosing Phys: Cara JONETTA Lovelace MD  Sonographer Comments: Image acquisition challenging due to respiratory motion. IMPRESSIONS  1. Left ventricular ejection fraction, by estimation, is 60 to 65%. The left ventricle  has normal function. The left ventricle has no regional wall motion abnormalities. Left ventricular diastolic parameters were normal.  2. Right ventricular systolic function is normal. The right ventricular size is normal.  3. The mitral valve is normal in structure. No evidence of mitral valve regurgitation.  4. The aortic valve is normal in structure. Aortic valve regurgitation is not visualized. FINDINGS  Left Ventricle: Left ventricular ejection fraction, by estimation, is 60 to 65%. The left ventricle has normal function. The left ventricle has no regional wall motion abnormalities. Strain was performed and the global longitudinal strain is indeterminate. The left ventricular internal cavity size was normal in size. There is no left ventricular hypertrophy. Left ventricular diastolic parameters were normal. Right Ventricle: The right ventricular size is normal. No increase in right ventricular wall thickness. Right ventricular systolic function is normal. Left Atrium: Left atrial size was normal in size. Right Atrium: Right atrial size was normal in size. Pericardium: There is no evidence of pericardial effusion. Mitral Valve: The  mitral valve is normal in structure. No evidence of mitral valve regurgitation. Tricuspid Valve: The tricuspid valve is normal in structure. Tricuspid valve regurgitation is trivial. Aortic Valve: The aortic valve is normal in structure. Aortic valve regurgitation is not visualized. Aortic valve peak gradient measures 7.4 mmHg. Pulmonic Valve: The pulmonic valve was normal in structure. Pulmonic valve regurgitation is not visualized. Aorta: The ascending aorta was not well visualized. IAS/Shunts: No atrial level shunt detected by color flow Doppler. Additional Comments: 3D was performed not requiring image post processing on an independent workstation and was indeterminate.  LEFT VENTRICLE PLAX 2D LVIDd:         4.60 cm   Diastology LVIDs:         3.10 cm   LV e' medial:    10.30 cm/s LV PW:         1.10 cm   LV E/e' medial:  7.1 LV IVS:        1.00 cm   LV e' lateral:   9.36 cm/s LVOT diam:     2.40 cm   LV E/e' lateral: 7.8 LV SV:         99 LV SV Index:   45 LVOT Area:     4.52 cm  RIGHT VENTRICLE RV Basal diam:  4.30 cm RV S prime:     20.90 cm/s TAPSE (M-mode): 2.8 cm LEFT ATRIUM             Index        RIGHT ATRIUM           Index LA Vol (A2C):   65.9 ml 30.10 ml/m  RA Area:     11.50 cm LA Vol (A4C):   36.6 ml 16.72 ml/m  RA Volume:   22.90 ml  10.46 ml/m LA Biplane Vol: 51.9 ml 23.71 ml/m  AORTIC VALVE                 PULMONIC VALVE AV Area (Vmax): 3.93 cm     PV Vmax:        1.33 m/s AV Vmax:        136.00 cm/s  PV Peak grad:   7.1 mmHg AV Peak Grad:   7.4 mmHg     RVOT Peak grad: 4 mmHg LVOT Vmax:      118.00 cm/s LVOT Vmean:     74.500 cm/s LVOT VTI:       0.218 m  AORTA Ao Root diam: 3.30  cm MITRAL VALVE MV Area (PHT): 3.46 cm    SHUNTS MV Decel Time: 219 msec    Systemic VTI:  0.22 m MV E velocity: 73.20 cm/s  Systemic Diam: 2.40 cm MV A velocity: 67.20 cm/s MV E/A ratio:  1.09 Dwayne D Callwood MD Electronically signed by Cara JONETTA Lovelace MD Signature Date/Time: 08/17/2024/4:27:40 PM    Final     CT Angio Chest/Abd/Pel for Dissection W and/or Wo Contrast Result Date: 08/17/2024 EXAM: CTA CHEST, ABDOMEN AND PELVIS WITH AND WITHOUT CONTRAST 08/17/2024 04:30:50 AM TECHNIQUE: CTA of the chest was performed with and without the administration of 150 mL of iohexol  (OMNIPAQUE ) 350 MG/ML injection. CTA of the abdomen and pelvis was performed with and without the administration of 150 mL of iohexol  (OMNIPAQUE ) 350 MG/ML injection. Multiplanar reformatted images are provided for review. MIP images are provided for review. Automated exposure control, iterative reconstruction, and/or weight based adjustment of the mA/kV was utilized to reduce the radiation dose to as low as reasonably achievable. COMPARISON: CT of the chest, abdomen and pelvis dated 07/14/2022. CLINICAL HISTORY: Acute aortic syndrome (AAS) suspected. FINDINGS: VASCULATURE: AORTA: No acute finding. No abdominal aortic aneurysm. No dissection. PULMONARY ARTERIES: No pulmonary embolism with the limits of this exam. GREAT VESSELS OF AORTIC ARCH: No acute finding. No dissection. No arterial occlusion or significant stenosis. CELIAC TRUNK: No acute finding. No occlusion or significant stenosis. SUPERIOR MESENTERIC ARTERY: No acute finding. No occlusion or significant stenosis. INFERIOR MESENTERIC ARTERY: No acute finding. No occlusion or significant stenosis. RENAL ARTERIES: No acute finding. No occlusion or significant stenosis. ILIAC ARTERIES: No acute finding. No occlusion or significant stenosis. CHEST: MEDIASTINUM: No mediastinal lymphadenopathy. The heart and pericardium demonstrate no acute abnormality. LUNGS AND PLEURA: There is mild atelectasis and bronchiectasis present dependently within the lower lobes bilaterally. There is also some focal bronchiectasis present posteriorly within the right upper lobe. No focal consolidation or pulmonary edema. No evidence of pleural effusion or pneumothorax. THORACIC BONES AND SOFT TISSUES: No acute bone or  soft tissue abnormality. ABDOMEN AND PELVIS: LIVER: The liver is unremarkable. GALLBLADDER AND BILE DUCTS: Gallbladder is unremarkable. No biliary ductal dilatation. SPLEEN: The spleen is unremarkable. PANCREAS: The pancreas is unremarkable. ADRENAL GLANDS: Bilateral adrenal glands demonstrate no acute abnormality. KIDNEYS, URETERS AND BLADDER: No stones in the kidneys or ureters. No hydronephrosis. No perinephric or periureteral stranding. Urinary bladder is unremarkable. GI AND BOWEL: Stomach and duodenal sweep demonstrate no acute abnormality. There are several chronic diverticula present. There is no bowel obstruction. No abnormal bowel wall thickening or distension. REPRODUCTIVE: Reproductive organs are unremarkable. PERITONEUM AND RETROPERITONEUM: No ascites or free air. LYMPH NODES: No lymphadenopathy. ABDOMINAL BONES AND SOFT TISSUES: No acute abnormality of the bones. No acute soft tissue abnormality. IMPRESSION: 1. No evidence of acute aortic syndrome. Electronically signed by: Evalene Coho MD 08/17/2024 04:54 AM EDT RP Workstation: [id number]   CT ANGIO HEAD NECK W WO CM (CODE STROKE) Result Date: 08/17/2024 EXAM: CTA Head and Neck with Intravenous Contrast. CT Head without Contrast. CLINICAL HISTORY: Neuro deficit, acute, stroke suspected. TECHNIQUE: Axial CTA images of the head and neck performed with intravenous contrast. MIP reconstructed images were created and reviewed. Axial computed tomography images of the head/brain performed without intravenous contrast. Note: Per PQRS, the description of internal carotid artery percent stenosis, including 0 percent or normal exam, is based on [place] American Symptomatic Carotid Endarterectomy Trial (NASCET) criteria. Dose reduction technique was used including one or more of the following: automated exposure control,  adjustment of mA and kV according to patient size, and/or iterative reconstruction. CONTRAST: Without and with; (iohexol  (OMNIPAQUE )  350 MG/ML injection 150 mL IOHEXOL  350 MG/ML SOLN) COMPARISON: None provided. FINDINGS: CT HEAD: BRAIN: No acute intraparenchymal hemorrhage. No mass lesion. No CT evidence for acute territorial infarct. No midline shift or extra-axial collection. VENTRICLES: No hydrocephalus. ORBITS: The orbits are unremarkable. SINUSES AND MASTOIDS: The paranasal sinuses and mastoid air cells are clear. CTA NECK: COMMON CAROTID ARTERIES: No significant stenosis. No dissection or occlusion. INTERNAL CAROTID ARTERIES: No stenosis by NASCET criteria. No dissection or occlusion. VERTEBRAL ARTERIES: No significant stenosis. No dissection or occlusion. CTA HEAD: ANTERIOR CEREBRAL ARTERIES: No significant stenosis. No occlusion. No aneurysm. MIDDLE CEREBRAL ARTERIES: No significant stenosis. No occlusion. No aneurysm. POSTERIOR CEREBRAL ARTERIES: No significant stenosis. No occlusion. No aneurysm. BASILAR ARTERY: No significant stenosis. No occlusion. No aneurysm. OTHER: SOFT TISSUES: There is mild level 1 and level 2 cervical lymphadenopathy bilaterally, slightly more pronounced on the right. There is a right level 2 node measuring up to 14 mm in AP diameter. There is relative hypertrophy of the right Sternocleidomastoid muscle. Please note that the above findings were discussed with Dr. Suzanne at 4:45 am 08/17/2024. BONES: No acute osseous abnormality. IMPRESSION: 1. No acute intracranial hemorrhage or ischemic change. 2. Mild bilateral level 1 and level 2 cervical lymphadenopathy, slightly more pronounced on the right, with a right level 2 node measuring up to 14 mm in AP diameter. 3. Relative hypertrophy of the right sternocleidomastoid muscle. 4. Findings discussed with Dr. Suzanne at 4:45 AM on 08/17/2024. Electronically signed by: Evalene Coho MD 08/17/2024 04:48 AM EDT RP Workstation: [id number]   CT HEAD CODE STROKE WO CONTRAST Result Date: 08/17/2024 EXAM: CT HEAD WITHOUT CONTRAST 08/17/2024 04:02:41 AM TECHNIQUE: CT of the  head was performed without the administration of intravenous contrast. Automated exposure control, iterative reconstruction, and/or weight based adjustment of the mA/kV was utilized to reduce the radiation dose to as low as reasonably achievable. COMPARISON: CT of the head dated 07/14/2022. CLINICAL HISTORY: Neuro deficit, acute, stroke suspected. Code stroke; Right side weakness; Lkw 0300; Dr suzanne [phone]. FINDINGS: BRAIN AND VENTRICLES: No acute hemorrhage. No evidence of acute infarct. No hydrocephalus. No extra-axial collection. No mass effect or midline shift. ORBITS: No acute abnormality. SINUSES: No acute abnormality. SOFT TISSUES AND SKULL: No acute soft tissue abnormality. No skull fracture. [place] Stroke Program Early CT (ASPECT) score: Ganglionic (caudate, IC, Lentiform Nucleus, insula, M1-M3): 7. Supraganglionic (M4-M6): 3. Total: 10. The above findings were discussed with Dr. Suzanne at 04:12 AM 08/17/2024. IMPRESSION: 1. No acute intracranial abnormality related to the suspected stroke. Electronically signed by: Evalene Coho MD 08/17/2024 04:14 AM EDT RP Workstation: [id number]    Microbiology: Results for orders placed or performed during the hospital encounter of 08/17/24  MRSA Next Gen by PCR, Nasal     Status: None   Collection Time: 08/17/24  8:28 AM   Specimen: Nasal Mucosa; Nasal Swab  Result Value Ref Range Status   MRSA by PCR Next Gen NOT DETECTED NOT DETECTED Final    Comment: (NOTE) The GeneXpert MRSA Assay (FDA approved for NASAL specimens only), is one component of a comprehensive MRSA colonization surveillance program. It is not intended to diagnose MRSA infection nor to guide or monitor treatment for MRSA infections. Test performance is not FDA approved in patients less than 6 years old. Performed at [facility], [address], [place], [place] [address]  Labs: CBC: Recent Labs  Lab 08/17/24 0351 08/18/24 0554  WBC 12.4* 6.9  NEUTROABS  7.4  --   HGB 13.7 12.6*  HCT 37.1* 34.6*  MCV 78.9* 80.1  PLT 180 155   Basic Metabolic Panel: Recent Labs  Lab 08/17/24 0351 08/18/24 0554  NA 138 139  K 3.5 3.6  CL 103 106  CO2 20* 21*  GLUCOSE 83 98  BUN 13 15  CREATININE 0.98 0.76  CALCIUM 9.0 8.3*  MG  --  2.1  PHOS  --  2.7   Liver Function Tests: Recent Labs  Lab 08/17/24 0351 08/18/24 0554  AST 35  --   ALT 15  --   ALKPHOS 41  --   BILITOT 0.8  --   PROT 8.2*  --   ALBUMIN 4.2 3.4*   CBG: Recent Labs  Lab 08/17/24 0352 08/17/24 0829 08/17/24 0853 08/17/24 0916  GLUCAP 85 61* 64* 72    Discharge time spent: greater than 30 minutes.  Signed: Leita Blanch, MD [facility] 08/18/2024

## 2024-08-18 NOTE — Progress Notes (Signed)
 Pt transported to/from MRI with this RN. MRI completed.

## 2024-12-01 ENCOUNTER — Emergency Department (HOSPITAL_COMMUNITY)
Admission: EM | Admit: 2024-12-01 | Discharge: 2024-12-02 | Disposition: A | Discharge status: Other Acute Inpatient Hospital

## 2024-12-01 ENCOUNTER — Encounter (HOSPITAL_COMMUNITY): Payer: Self-pay

## 2024-12-01 ENCOUNTER — Other Ambulatory Visit: Payer: Self-pay

## 2024-12-01 DIAGNOSIS — J45909 Unspecified asthma, uncomplicated: Secondary | ICD-10-CM | POA: Insufficient documentation

## 2024-12-01 DIAGNOSIS — D72829 Elevated white blood cell count, unspecified: Secondary | ICD-10-CM | POA: Insufficient documentation

## 2024-12-01 DIAGNOSIS — F1410 Cocaine abuse, uncomplicated: Secondary | ICD-10-CM

## 2024-12-01 DIAGNOSIS — R443 Hallucinations, unspecified: Secondary | ICD-10-CM | POA: Insufficient documentation

## 2024-12-01 DIAGNOSIS — F1490 Cocaine use, unspecified, uncomplicated: Secondary | ICD-10-CM | POA: Diagnosis not present

## 2024-12-01 DIAGNOSIS — F312 Bipolar disorder, current episode manic severe with psychotic features: Secondary | ICD-10-CM | POA: Diagnosis present

## 2024-12-01 DIAGNOSIS — Z7982 Long term (current) use of aspirin: Secondary | ICD-10-CM | POA: Insufficient documentation

## 2024-12-01 DIAGNOSIS — R45851 Suicidal ideations: Secondary | ICD-10-CM | POA: Diagnosis not present

## 2024-12-01 DIAGNOSIS — F319 Bipolar disorder, unspecified: Secondary | ICD-10-CM

## 2024-12-01 DIAGNOSIS — F17210 Nicotine dependence, cigarettes, uncomplicated: Secondary | ICD-10-CM | POA: Diagnosis not present

## 2024-12-01 DIAGNOSIS — F32A Depression, unspecified: Secondary | ICD-10-CM

## 2024-12-01 LAB — COMPREHENSIVE METABOLIC PANEL WITH GFR
ALT: 11 U/L (ref 0–44)
AST: 25 U/L (ref 15–41)
Albumin: 4.2 g/dL (ref 3.5–5.0)
Alkaline Phosphatase: 50 U/L (ref 38–126)
Anion gap: 13 (ref 5–15)
BUN: 10 mg/dL (ref 6–20)
CO2: 21 mmol/L — ABNORMAL LOW (ref 22–32)
Calcium: 8.8 mg/dL — ABNORMAL LOW (ref 8.9–10.3)
Chloride: 105 mmol/L (ref 98–111)
Creatinine, Ser: 0.86 mg/dL (ref 0.61–1.24)
GFR, Estimated: >60 mL/min
Glucose, Bld: 91 mg/dL (ref 70–99)
Potassium: 4 mmol/L (ref 3.5–5.1)
Sodium: 139 mmol/L (ref 135–145)
Total Bilirubin: 0.7 mg/dL (ref 0.0–1.2)
Total Protein: 7.4 g/dL (ref 6.5–8.1)

## 2024-12-01 LAB — CBC WITH DIFFERENTIAL/PLATELET
Abs Immature Granulocytes: 0.03 K/uL (ref 0.00–0.07)
Basophils Absolute: 0 K/uL (ref 0.0–0.1)
Basophils Relative: 0 %
Eosinophils Absolute: 0 K/uL (ref 0.0–0.5)
Eosinophils Relative: 0 %
HCT: 40.9 % (ref 39.0–52.0)
Hemoglobin: 14.5 g/dL (ref 13.0–17.0)
Immature Granulocytes: 0 %
Lymphocytes Relative: 28 %
Lymphs Abs: 3 K/uL (ref 0.7–4.0)
MCH: 29.3 pg (ref 26.0–34.0)
MCHC: 35.5 g/dL (ref 30.0–36.0)
MCV: 82.6 fL (ref 80.0–100.0)
Monocytes Absolute: 0.9 K/uL (ref 0.1–1.0)
Monocytes Relative: 8 %
Neutro Abs: 7 K/uL (ref 1.7–7.7)
Neutrophils Relative %: 64 %
Platelets: 195 K/uL (ref 150–400)
RBC: 4.95 MIL/uL (ref 4.22–5.81)
RDW: 14.4 % (ref 11.5–15.5)
WBC: 11 K/uL — ABNORMAL HIGH (ref 4.0–10.5)
nRBC: 0 % (ref 0.0–0.2)

## 2024-12-01 LAB — URINE DRUG SCREEN
Amphetamines: NEGATIVE
Barbiturates: NEGATIVE
Benzodiazepines: NEGATIVE
Cocaine: POSITIVE — AB
Fentanyl: NEGATIVE
Methadone Scn, Ur: NEGATIVE
Opiates: NEGATIVE
Tetrahydrocannabinol: POSITIVE — AB

## 2024-12-01 LAB — ACETAMINOPHEN LEVEL: Acetaminophen (Tylenol), Serum: <10 ug/mL — ABNORMAL LOW (ref 10–30)

## 2024-12-01 LAB — SALICYLATE LEVEL: Salicylate Lvl: <7 mg/dL — ABNORMAL LOW (ref 7.0–30.0)

## 2024-12-01 LAB — ETHANOL: Alcohol, Ethyl (B): <15 mg/dL

## 2024-12-01 MED ORDER — ATORVASTATIN CALCIUM 40 MG PO TABS: 40.0000 mg | ORAL_TABLET | Freq: Every day | ORAL | Status: DC

## 2024-12-01 MED ORDER — ASPIRIN 81 MG PO TBEC: 81.0000 mg | DELAYED_RELEASE_TABLET | Freq: Every day | ORAL | Status: DC

## 2024-12-01 NOTE — ED Notes (Signed)
 Patient belongings put in locker 1. Includes shoes, socks, pants, shirt, jacket, du rag, and [employer] bag.

## 2024-12-01 NOTE — BH Assessment (Signed)
 Patient was deferred to [name] for a telepsych assessment. The assigned care coordinator will provide updates regarding the scheduling of the assessment. [name] coordinator can be reached at [phone] for further information on the timing of the telepsych evaluation.

## 2024-12-01 NOTE — ED Provider Notes (Cosign Needed Addendum)
 " Dominic [name] EMERGENCY DEPARTMENT AT [facility] Provider Note   CSN: [id number] Arrival date & time: 12/01/24  1902     Patient presents with: Suicidal   Dominic [name] is a 49 y.o. male with past medical history of polysubstance abuse, hepatitis C, asthma, schizoaffective disorder, CVA (09/06/2024) presents to emergency department via EMS for evaluation of SI, hallucinations, depression.  Reports that he was released from jail late October and was only released with a 30-day supply of his Seroquel , Cymbalta.  Reports he has been out of these medications for past 2 months.  Today, was found laying in the street with the intention to get hit by car to end his life.  He has been having auditory and visual hallucinations, depression SI for past week   HPI     Prior to Admission medications  Medication Sig Start Date End Date Taking? Authorizing Provider  aspirin  EC 81 MG tablet Take 1 tablet (81 mg total) by mouth daily. Swallow whole. 08/18/24   Patel, Sona, MD  atorvastatin  (LIPITOR) 40 MG tablet Take 1 tablet (40 mg total) by mouth daily. 08/18/24   Patel, Sona, MD    Allergies: Patient has no known allergies.    Review of Systems  Psychiatric/Behavioral:  Positive for suicidal ideas.     Updated Vital Signs BP 127/86   Pulse 76   Temp 98.1 F (36.7 C)   Resp 20   SpO2 97%   Physical Exam Vitals and nursing note reviewed.  Constitutional:      General: He is not in acute distress.    Appearance: Normal appearance.  HENT:     Head: Normocephalic and atraumatic.  Eyes:     Conjunctiva/sclera: Conjunctivae normal.  Cardiovascular:     Rate and Rhythm: Normal rate.  Pulmonary:     Effort: Pulmonary effort is normal. No respiratory distress.     Breath sounds: Normal breath sounds.  Skin:    Coloration: Skin is not jaundiced or pale.     Comments: No self-harm injury to chest, abdomen, back, arms, legs  Neurological:     Mental Status: He is alert and  oriented to person, place, and time. Mental status is at baseline.     (all labs ordered are listed, but only abnormal results are displayed) Labs Reviewed  COMPREHENSIVE METABOLIC PANEL WITH GFR - Abnormal; Notable for the following components:      Result Value   CO2 21 (*)    Calcium  8.8 (*)    All other components within normal limits  CBC WITH DIFFERENTIAL/PLATELET - Abnormal; Notable for the following components:   WBC 11.0 (*)    All other components within normal limits  ACETAMINOPHEN  LEVEL - Abnormal; Notable for the following components:   Acetaminophen  (Tylenol ), Serum <10 (*)    All other components within normal limits  SALICYLATE LEVEL - Abnormal; Notable for the following components:   Salicylate Lvl <7.0 (*)    All other components within normal limits  ETHANOL  URINE DRUG SCREEN    EKG: EKG Interpretation Date/Time:  Monday December 01 2024 19:45:52 EST Ventricular Rate:  74 PR Interval:  176 QRS Duration:  88 QT Interval:  390 QTC Calculation: 432 R Axis:   47  Text Interpretation: Normal sinus rhythm Septal infarct , age undetermined Compared with prior EKG from 08/17/2024 Confirmed by Gennaro Bouchard ([id number]) on 12/01/2024 7:52:18 PM  Radiology: No results found.   Medications Ordered in the ED - No data  to display                                  Medical Decision Making Amount and/or Complexity of Data Reviewed Labs: ordered.  Risk OTC drugs. Prescription drug management.   Patient presents to the ED for concern of SI, hallucinations, this involves an extensive number of treatment options, and is a complaint that carries with it a high risk of complications and morbidity.  The differential diagnosis includes electrolyte abnormality, psychosis, medication noncompliance   Co morbidities that complicate the patient evaluation  Polysubstance abuse Schizoaffective disorder   Additional history obtained:  Additional history obtained from  Nursing   External records from outside source obtained and reviewed including triage RN note   Lab Tests:  I Ordered, and personally interpreted labs.  The pertinent results include:   WBC 11 Ca 8.8    Cardiac Monitoring:  The patient was maintained on a cardiac monitor.  I personally viewed and interpreted the cardiac monitored which showed an underlying rhythm of: NSR with no ischemic changes    Consultations Obtained:  I requested consultation with TTS,  and discussed lab and imaging findings as well as pertinent plan - they recommend: pending   Problem List / ED Course:  SI Depression Hallucinations Vital signs stable. A&Ox3 No other complaints. Denies CP, SHOB Currently voluntary but under IVC by EDP as he is unable to leave prior to being assessed by TTS d/t SI with plan, hallucinations No signs of self inflicted injuries. Moving all ext wo difficulty Labs unremarkable for acute abnormalities EKG without ischemic changes Placed diet order and ordered prescribed medications Medically clear awaiting TTS   Reevaluation:  After the interventions noted above, I reevaluated the patient and found that they have :stayed the same     Dispostion:   Dispo pending TTS recommendations Patient currently medically clear and awaiting TTS consult   Final diagnoses:  Suicidal ideation  Depression, unspecified depression type  Hallucinations    ED Discharge Orders     None        Minnie Tinnie BRAVO, PA 12/01/24 2059  "

## 2024-12-01 NOTE — ED Notes (Signed)
 Patient wanded by security.

## 2024-12-01 NOTE — ED Triage Notes (Addendum)
 Pt BIB [facility], found laying in the road. Pt having SI but no intent of carrying them out. Released from prison in November and only had 30 day supply of bipolar/schizophrenia medication.  Endorses seeing snakes and hearing voices x1week.  Withdrawn and reserved at this time, VSS.

## 2024-12-01 NOTE — ED Notes (Signed)
 IVC PAPERWORK UPLOADED INTO CHART

## 2024-12-02 ENCOUNTER — Inpatient Hospital Stay
Admission: AD | Admit: 2024-12-02 | Discharge: 2024-12-05 | DRG: 885 | Disposition: A | Discharge status: Home / Self Care | Source: Intra-hospital | Attending: Psychiatry | Admitting: Psychiatry

## 2024-12-02 ENCOUNTER — Encounter: Payer: Self-pay | Admitting: Psychiatry

## 2024-12-02 DIAGNOSIS — F209 Schizophrenia, unspecified: Secondary | ICD-10-CM | POA: Diagnosis present

## 2024-12-02 DIAGNOSIS — F1410 Cocaine abuse, uncomplicated: Secondary | ICD-10-CM

## 2024-12-02 DIAGNOSIS — B182 Chronic viral hepatitis C: Secondary | ICD-10-CM | POA: Diagnosis present

## 2024-12-02 DIAGNOSIS — R45851 Suicidal ideations: Secondary | ICD-10-CM | POA: Diagnosis present

## 2024-12-02 DIAGNOSIS — F3160 Bipolar disorder, current episode mixed, unspecified: Principal | ICD-10-CM | POA: Diagnosis present

## 2024-12-02 DIAGNOSIS — Z7982 Long term (current) use of aspirin: Secondary | ICD-10-CM

## 2024-12-02 DIAGNOSIS — F319 Bipolar disorder, unspecified: Secondary | ICD-10-CM

## 2024-12-02 DIAGNOSIS — F17210 Nicotine dependence, cigarettes, uncomplicated: Secondary | ICD-10-CM | POA: Diagnosis present

## 2024-12-02 DIAGNOSIS — Z9152 Personal history of nonsuicidal self-harm: Secondary | ICD-10-CM | POA: Diagnosis not present

## 2024-12-02 DIAGNOSIS — Z818 Family history of other mental and behavioral disorders: Secondary | ICD-10-CM | POA: Diagnosis not present

## 2024-12-02 DIAGNOSIS — Z653 Problems related to other legal circumstances: Secondary | ICD-10-CM | POA: Diagnosis not present

## 2024-12-02 DIAGNOSIS — F1010 Alcohol abuse, uncomplicated: Secondary | ICD-10-CM | POA: Diagnosis present

## 2024-12-02 DIAGNOSIS — Z604 Social exclusion and rejection: Secondary | ICD-10-CM | POA: Diagnosis present

## 2024-12-02 DIAGNOSIS — Z79899 Other long term (current) drug therapy: Secondary | ICD-10-CM

## 2024-12-02 DIAGNOSIS — Z8249 Family history of ischemic heart disease and other diseases of the circulatory system: Secondary | ICD-10-CM | POA: Diagnosis not present

## 2024-12-02 DIAGNOSIS — F312 Bipolar disorder, current episode manic severe with psychotic features: Secondary | ICD-10-CM | POA: Diagnosis not present

## 2024-12-02 MED ORDER — HALOPERIDOL LACTATE 5 MG/ML IJ SOLN: 10.0000 mg | Freq: Three times a day (TID) | INTRAMUSCULAR | Status: DC | PRN

## 2024-12-02 MED ORDER — MAGNESIUM HYDROXIDE 400 MG/5ML PO SUSP: 30.0000 mL | Freq: Every day | ORAL | Status: DC | PRN

## 2024-12-02 MED ORDER — LORAZEPAM 2 MG/ML IJ SOLN: 2.0000 mg | Freq: Three times a day (TID) | INTRAMUSCULAR | Status: DC | PRN

## 2024-12-02 MED ORDER — HALOPERIDOL 5 MG PO TABS
5.0000 mg | ORAL_TABLET | Freq: Three times a day (TID) | ORAL | Status: DC | PRN
  Administered 2024-12-02: 5 mg via ORAL
  Filled 2024-12-02: qty 1

## 2024-12-02 MED ORDER — DIPHENHYDRAMINE HCL 50 MG/ML IJ SOLN: 50.0000 mg | Freq: Three times a day (TID) | INTRAMUSCULAR | Status: DC | PRN

## 2024-12-02 MED ORDER — HALOPERIDOL LACTATE 5 MG/ML IJ SOLN: 5.0000 mg | Freq: Three times a day (TID) | INTRAMUSCULAR | Status: DC | PRN

## 2024-12-02 MED ORDER — ARIPIPRAZOLE 2 MG PO TABS
2.0000 mg | ORAL_TABLET | Freq: Every day | ORAL | Status: DC
  Administered 2024-12-02 – 2024-12-05 (×4): 2 mg via ORAL
  Filled 2024-12-02 (×4): qty 1

## 2024-12-02 MED ORDER — QUETIAPINE FUMARATE 100 MG PO TABS
100.0000 mg | ORAL_TABLET | Freq: Every day | ORAL | Status: DC
  Administered 2024-12-02: 100 mg via ORAL
  Filled 2024-12-02: qty 1

## 2024-12-02 MED ORDER — ACETAMINOPHEN 325 MG PO TABS
650.0000 mg | ORAL_TABLET | Freq: Four times a day (QID) | ORAL | Status: DC | PRN
  Administered 2024-12-04 (×2): 650 mg via ORAL
  Filled 2024-12-02 (×2): qty 2

## 2024-12-02 MED ORDER — DIPHENHYDRAMINE HCL 25 MG PO CAPS
50.0000 mg | ORAL_CAPSULE | Freq: Three times a day (TID) | ORAL | Status: DC | PRN
  Administered 2024-12-02: 50 mg via ORAL
  Filled 2024-12-02: qty 2

## 2024-12-02 MED ORDER — ALUM & MAG HYDROXIDE-SIMETH 200-200-20 MG/5ML PO SUSP: 30.0000 mL | ORAL | Status: DC | PRN

## 2024-12-02 NOTE — Progress Notes (Addendum)
 Skin assessment and search done with Adina, RN on admission.   R ankle monitor and multiple tattoos on abdomen, chest and bilateral forearms.  No contraband found on his person.

## 2024-12-02 NOTE — ED Notes (Signed)
 TTS in progress via [name]

## 2024-12-02 NOTE — Group Note (Signed)
 [facility] LCSW Group Therapy Note   Group Date: 12/02/2024 Start Time: 1335 End Time: 1415  Type of Therapy/Topic:  Group Therapy:  Feelings about Diagnosis  Participation Level:  Did Not Attend   Description of Group:    This group will allow patients to explore their thoughts and feelings about diagnoses they have received. Patients will be guided to explore their level of understanding and acceptance of these diagnoses. Facilitator will encourage patients to process their thoughts and feelings about the reactions of others to their diagnosis, and will guide patients in identifying ways to discuss their diagnosis with significant others in their lives. This group will be process-oriented, with patients participating in exploration of their own experiences as well as giving and receiving support and challenge from other group members.   Therapeutic Goals: 1. Patient will demonstrate understanding of diagnosis as evidence by identifying two or more symptoms of the disorder:  2. Patient will be able to express two feelings regarding the diagnosis 3. Patient will demonstrate ability to communicate their needs through discussion and/or role plays  Summary of Patient Progress:    X    Therapeutic Modalities:   Cognitive Behavioral Therapy Brief Therapy Feelings Identification    [name], LCSW

## 2024-12-02 NOTE — Group Note (Signed)
 Recreation Therapy Group Note   Group Topic:Animal Assisted Therapy   Group Date: 12/02/2024 Start Time: 1000 End Time: 1030 Facilitators: Celestia Jeoffrey BRAVO, LRT, CTRS Location: Dayroom  Group Description: AAA. Animal-Assisted Activity provides opportunities for motivational, educational, therapeutic and/or recreational benefits to enhance quality of life. Selinda and Rollo visited the unit to interact with patients.   Goal Areas Addressed:  Reduced anxiety and stress Improved mood Increased social interaction Enhanced communication skills Reduced loneliness and isolation Improved emotional regulation   Affect/Mood: N/A   Participation Level: Did not attend    Clinical Observations/Individualized Feedback: Patient did not attend.  Plan: Continue to engage patient in RT group sessions 2-3x/week.   Jeoffrey BRAVO Celestia, LRT, CTRS 12/02/2024 11:42 AM

## 2024-12-02 NOTE — ED Notes (Signed)
 2 bags of belongings given to [employer] officer to transport with pt

## 2024-12-02 NOTE — Progress Notes (Signed)
 Patient admitted from [facility], report received from [place], [place]. Pt calm and pleasant during assessment. Pt endorsees passive SI, verbally contracts for safety with this clinical research associate. Pt endorses A/H. Pt states he has been off his medications since December and his A/H have been getting worse. Pt skin assessment completed with Orene, RN, pt has several tattoos scattered across his body. Pt being monitored Q 15 minutes for safety per unit protocol, remains safe on the unit

## 2024-12-02 NOTE — Group Note (Signed)
 Recreation Therapy Group Note   Group Topic:Stress Management  Group Date: 12/02/2024 Start Time: 1530 End Time: 1615 Facilitators: Celestia Jeoffrey BRAVO, LRT, CTRS Location: Dayroom  Group Description: UNO. LRT and pts played games of UNO. LRT prompted group discussion on the physical signs and symptoms of stress, like those you feel when playing a competitive card game. LRT and pt discussed the physical and mental signs of stress, as well as coping skills to manage them.   Goal Area(s) Addressed: Patient will identify physical symptoms of stress. Patient will identify coping skills for stress. Patient will build frustration tolerance skills.  Patient will increase communication.    Affect/Mood: N/A   Participation Level: Did not attend    Clinical Observations/Individualized Feedback: Patient did not attend.  Plan: Continue to engage patient in RT group sessions 2-3x/week.   Jeoffrey BRAVO Celestia, LRT, CTRS 12/02/2024 5:28 PM

## 2024-12-02 NOTE — ED Notes (Signed)
 TTS cart placed in pt room- pt a/w TTS- sitter present outside of doorway

## 2024-12-02 NOTE — Progress Notes (Signed)
" °   12/02/24 0900  Psych Admission Type (Psych Patients Only)  Admission Status Involuntary  Psychosocial Assessment  Patient Complaints Anxiety  Eye Contact Intense  Facial Expression Anxious  Affect Anxious  Speech Argumentative  Interaction Assertive  Motor Activity Other (Comment) (steady)  Appearance/Hygiene Unremarkable  Behavior Characteristics Cooperative  Mood Anxious  Thought Process  Coherency WDL  Content WDL  Delusions None reported or observed  Perception Hallucinations  Hallucination Auditory (command AH telling to kill self)  Judgment Impaired  Confusion None  Danger to Self  Current suicidal ideation? Passive  Agreement Not to Harm Self Yes  Description of Agreement verbal    "

## 2024-12-02 NOTE — Plan of Care (Signed)
 Pt new to the unit, hasn't had time to progress  Problem: Education: Goal: Knowledge of [facility] Education information/materials will improve Outcome: Not Progressing Goal: Emotional status will improve Outcome: Not Progressing Goal: Mental status will improve Outcome: Not Progressing Goal: Verbalization of understanding the information provided will improve Outcome: Not Progressing   Problem: Activity: Goal: Interest or engagement in activities will improve Outcome: Not Progressing Goal: Sleeping patterns will improve Outcome: Not Progressing   Problem: Coping: Goal: Ability to verbalize frustrations and anger appropriately will improve Outcome: Not Progressing Goal: Ability to demonstrate self-control will improve Outcome: Not Progressing   Problem: Health Behavior/Discharge Planning: Goal: Identification of resources available to assist in meeting health care needs will improve Outcome: Not Progressing Goal: Compliance with treatment plan for underlying cause of condition will improve Outcome: Not Progressing   Problem: Physical Regulation: Goal: Ability to maintain clinical measurements within normal limits will improve Outcome: Not Progressing   Problem: Safety: Goal: Periods of time without injury will increase Outcome: Not Progressing

## 2024-12-02 NOTE — Plan of Care (Signed)
   Problem: Education: Goal: Emotional status will improve Outcome: Progressing

## 2024-12-02 NOTE — ED Notes (Signed)
Pt given 2 cups of water per request

## 2024-12-02 NOTE — Tx Team (Signed)
 Initial Treatment Plan 12/02/2024 7:00 AM Ej Kelsey Silvan FMW:[id number]    PATIENT STRESSORS: Medication change or noncompliance   Substance abuse     PATIENT STRENGTHS: Ability for insight  Motivation for treatment/growth    PATIENT IDENTIFIED PROBLEMS: SI  A/H  Anxiety                 DISCHARGE CRITERIA:  Motivation to continue treatment in a less acute level of care Verbal commitment to aftercare and medication compliance  PRELIMINARY DISCHARGE PLAN: Outpatient therapy Return to previous living arrangement  PATIENT/FAMILY INVOLVEMENT: This treatment plan has been presented to and reviewed with the patient, Nature Vogelsang.  The patient has been given the opportunity to ask questions and make suggestions.  Donnice ONEIDA Chess, RN 12/02/2024, 7:00 AM

## 2024-12-02 NOTE — [facility] Suicide Risk Assessment (Incomplete)
 [facility] Admission Suicide Risk Assessment   Nursing information obtained from:  Patient Demographic factors:  NA Current Mental Status:  NA Loss Factors:  NA Historical Factors:  NA Risk Reduction Factors:  NA  Total Time spent with patient: {Time; 15 min - 8 hours:17441} Principal Problem: Bipolar disorder, mixed (HCC) Diagnosis:  Principal Problem:   Bipolar disorder, mixed (HCC)  Subjective Data: ***  Continued Clinical Symptoms:  Alcohol Use Disorder Identification Test Final Score (AUDIT): 0 The Alcohol Use Disorders Identification Test, Guidelines for Use in Primary Care, Second Edition.  World Science Writer [facility]). Score between 0-7:  no or low risk or alcohol related problems. Score between 8-15:  moderate risk of alcohol related problems. Score between 16-19:  high risk of alcohol related problems. Score 20 or above:  warrants further diagnostic evaluation for alcohol dependence and treatment.   CLINICAL FACTORS:   {Clinical Factors:22706}   Musculoskeletal: Strength & Muscle Tone: {desc; muscle tone:32375} Gait & Station: {PE GAIT ED WJUO:77474} Patient leans: {Patient Leans:21022755}  Psychiatric Specialty Exam:  Presentation  General Appearance:  Appropriate for Environment  Eye Contact: Fair  Speech: Clear and Coherent  Speech Volume: Decreased  Handedness:No data recorded  Mood and Affect  Mood: Euthymic  Affect: Appropriate   Thought Process  Thought Processes: Coherent  Descriptions of Associations:Intact  Orientation:Full (Time, Place and Person)  Thought Content:Logical  History of Schizophrenia/Schizoaffective disorder:No data recorded Duration of Psychotic Symptoms:No data recorded Hallucinations:Hallucinations: Auditory; Visual Description of Auditory Hallucinations: voices saying hurt myself Description of Visual Hallucinations: Spirits  Ideas of Reference:None  Suicidal Thoughts:Suicidal Thoughts: Yes,  Passive SI Passive Intent and/or Plan: With Plan  Homicidal Thoughts:Homicidal Thoughts: No   Sensorium  Memory: Immediate Fair; Recent Fair; Remote Fair  Judgment: Poor  Insight: Poor   Executive Functions  Concentration: Fair  Attention Span: Fair  Recall: [employer] of Knowledge: Fair  Language: Fair   Psychomotor Activity  Psychomotor Activity: Psychomotor Activity: Normal   Assets  Assets: Manufacturing Systems Engineer; Housing; Social Support   Sleep  Sleep: Sleep: Fair    Physical Exam: Physical Exam ROS Blood pressure 129/88, pulse 60, temperature 97.7 F (36.5 C), temperature source Oral, resp. rate 17, height 5' 11 (1.803 m), weight 99.1 kg, SpO2 98%. Body mass index is 30.47 kg/m.   COGNITIVE FEATURES THAT CONTRIBUTE TO RISK:  {Cognitive Features:304700251}    SUICIDE RISK:   {B[facility] SUICIDE RISK:22704}  PLAN OF CARE: ***  I certify that inpatient services furnished can reasonably be expected to improve the patient's condition.   Jaterrius Ricketson, NP 12/02/2024, 3:27 PM

## 2024-12-02 NOTE — Group Note (Signed)
 Date:  12/02/2024 Time:  5:11 PM  Group Topic/Focus:  Building Self Esteem:   The Focus of this group is helping patients become aware of the effects of self-esteem on their lives, the things they and others do that enhance or undermine their self-esteem, seeing the relationship between their level of self-esteem and the choices they make and learning ways to enhance self-esteem.    Participation Level:  Did Not Attend   Clayborne DELENA June 12/02/2024, 5:11 PM

## 2024-12-02 NOTE — H&P (Signed)
 "  Psychiatric Admission Assessment Adult  Patient Identification: Dominic [name] MRN:  [id number] Date of Evaluation:  12/02/2024 Chief Complaint:  Bipolar disorder, mixed (HCC) [F31.60]   History of Present Illness: ***  Did the patient present with any abnormal findings indicating the need for additional neurological or psychological testing?  {Yes/No:304960894}  Total Time spent with patient: {Time; 15 min - 8 hours:17441} Sleep  Sleep:Sleep: Fair  Past Psychiatric History: *** Psychiatric History:  Information collected from ***  Prev Dx/Sx: *** Current Psych Provider: *** Home Meds (current): *** Previous Med Trials: *** Therapy: ***  Prior Psych Hospitalization: ***  Prior Self Harm: *** Prior Violence: ***  Family Psych History: *** Family Hx suicide: ***  Social History:  Developmental Hx: *** Educational Hx: *** Occupational Hx: *** Legal Hx: *** Living Situation: *** Spiritual Hx: *** Access to weapons/lethal means: ***   Substance History Alcohol: ***  Type of alcohol *** Last Drink *** Number of drinks per day *** History of alcohol withdrawal seizures *** History of DT's *** Tobacco: *** Illicit drugs: *** Prescription drug abuse: *** Rehab hx: *** Is the patient at risk to self? {yes no:314532}  Has the patient been a risk to self in the past 6 months? {yes no:314532}  Has the patient been a risk to self within the distant past? {yes no:314532}  Is the patient a risk to others? {yes no:314532}  Has the patient been a risk to others in the past 6 months? {yes no:314532}  Has the patient been a risk to others within the distant past? {yes no:314532}   Columbia Scale:  Flowsheet Row Admission (Current) from 12/02/2024 in [facility] ED from 12/01/2024 in [facility] Emergency Department at [facility] ED to Hosp-Admission (Discharged) from 08/17/2024 in [facility] 1C MEDICAL TELEMETRY  C-SSRS  RISK CATEGORY High Risk High Risk No Risk     Past Medical History:  Past Medical History:  Diagnosis Date   Alcohol intoxication, uncomplicated 01/13/2019   Anxiety    Asthma    Bipolar 1 disorder (HCC)    Chronic hepatitis C without hepatic coma (HCC) 05/28/2018   Depression    Hepatitis C    Hepatitis C antibody positive in blood 05/28/2018   Malingering 03/30/2022   Psychiatry note from [facility] states directly admitted to fabricating SI and CAH to obtain [psychiatric] admission   Schizophrenia (HCC)    Sleep apnea     Past Surgical History:  Procedure Laterality Date   CYST REMOVAL NECK     neck   INTRAMEDULLARY (IM) NAIL FIBULA Right 07/14/2022   Procedure: INTRAMEDULLARY (IM) NAIL FIBULA;  Surgeon: Rollene Cough, MD;  Location: [facility] ORS;  Service: Orthopedics;  Laterality: Right;   TIBIA IM NAIL INSERTION Right 07/14/2022   Procedure: INTRAMEDULLARY (IM) NAIL TIBIAL;  Surgeon: Rollene Cough, MD;  Location: [facility] ORS;  Service: Orthopedics;  Laterality: Right;   Family History:  Family History  Problem Relation Age of Onset   Hypertension Sister    Schizophrenia Sister     Social History:  Social History   Substance and Sexual Activity  Alcohol Use Yes   Alcohol/week: 3.0 standard drinks of alcohol   Types: 3 Shots of liquor per week     Social History   Substance and Sexual Activity  Drug Use Yes   Types: Cocaine, Marijuana, Methamphetamines, Fentanyl    Comment: last night cocaine      Allergies:  Allergies[1] Lab Results:  Results  for orders placed or performed during the hospital encounter of 12/01/24 (from the past 48 hours)  Urine rapid drug screen (hosp performed)     Status: Abnormal   Collection Time: 12/01/24  7:25 PM  Result Value Ref Range   Opiates NEGATIVE NEGATIVE   Cocaine POSITIVE (A) NEGATIVE   Benzodiazepines NEGATIVE NEGATIVE   Amphetamines NEGATIVE NEGATIVE   Tetrahydrocannabinol POSITIVE (A) NEGATIVE   Barbiturates NEGATIVE  NEGATIVE   Methadone Scn, Ur NEGATIVE NEGATIVE   Fentanyl  NEGATIVE NEGATIVE    Comment: (NOTE) Drug screen is for Medical Purposes only. Positive results are preliminary only. If confirmation is needed, notify lab within 5 days.  Drug Class                 Cutoff (ng/mL) Amphetamine and metabolites 1000 Barbiturate and metabolites 200 Benzodiazepine              200 Opiates and metabolites     300 Cocaine and metabolites     300 THC                         50 Fentanyl                     5 Methadone                   300  Trazodone  is metabolized in vivo to several metabolites,  including pharmacologically active m-CPP, which is excreted in the  urine.  Immunoassay screens for amphetamines and MDMA have potential  cross-reactivity with these compounds and Kary Colaizzi provide false positive  result.  Performed at [facility], [address], [place], [place] [address]   Comprehensive metabolic panel     Status: Abnormal   Collection Time: 12/01/24  8:04 PM  Result Value Ref Range   Sodium 139 135 - 145 mmol/L   Potassium 4.0 3.5 - 5.1 mmol/L   Chloride 105 98 - 111 mmol/L   CO2 21 (L) 22 - 32 mmol/L   Glucose, Bld 91 70 - 99 mg/dL    Comment: Glucose reference range applies only to samples taken after fasting for at least 8 hours.   BUN 10 6 - 20 mg/dL   Creatinine, Ser 9.13 0.61 - 1.24 mg/dL   Calcium  8.8 (L) 8.9 - 10.3 mg/dL   Total Protein 7.4 6.5 - 8.1 g/dL   Albumin 4.2 3.5 - 5.0 g/dL   AST 25 15 - 41 U/L   ALT 11 0 - 44 U/L   Alkaline Phosphatase 50 38 - 126 U/L   Total Bilirubin 0.7 0.0 - 1.2 mg/dL   GFR, Estimated >39 >39 mL/min    Comment: (NOTE) Calculated using the CKD-EPI Creatinine Equation (2021)    Anion gap 13 5 - 15    Comment: Performed at [facility], [address] [place], [place] [address]  Ethanol     Status: None   Collection Time: 12/01/24  8:04 PM  Result Value Ref Range   Alcohol, Ethyl (B) <15 <15 mg/dL    Comment: (NOTE) For medical purposes  only. Performed at [facility], [address], [place], [place] [address]   CBC with Diff     Status: Abnormal   Collection Time: 12/01/24  8:04 PM  Result Value Ref Range   WBC 11.0 (H) 4.0 - 10.5 K/uL   RBC 4.95 4.22 - 5.81 MIL/uL   Hemoglobin 14.5 13.0 - 17.0 g/dL  HCT 40.9 39.0 - 52.0 %   MCV 82.6 80.0 - 100.0 fL   MCH 29.3 26.0 - 34.0 pg   MCHC 35.5 30.0 - 36.0 g/dL   RDW 85.5 88.4 - 84.4 %   Platelets 195 150 - 400 K/uL   nRBC 0.0 0.0 - 0.2 %   Neutrophils Relative % 64 %   Neutro Abs 7.0 1.7 - 7.7 K/uL   Lymphocytes Relative 28 %   Lymphs Abs 3.0 0.7 - 4.0 K/uL   Monocytes Relative 8 %   Monocytes Absolute 0.9 0.1 - 1.0 K/uL   Eosinophils Relative 0 %   Eosinophils Absolute 0.0 0.0 - 0.5 K/uL   Basophils Relative 0 %   Basophils Absolute 0.0 0.0 - 0.1 K/uL   Immature Granulocytes 0 %   Abs Immature Granulocytes 0.03 0.00 - 0.07 K/uL    Comment: Performed at [facility], [address], [place], [place] [address]  Acetaminophen  level     Status: Abnormal   Collection Time: 12/01/24  8:04 PM  Result Value Ref Range   Acetaminophen  (Tylenol ), Serum <10 (L) 10 - 30 ug/mL    Comment: (NOTE) Toxic concentrations can be more effectively related to post dose interval; >200, >100, and >50 ug/mL serum concentrations correspond to toxic concentrations at 4, 8, and 12 hours post dose, respectively.  Performed at [facility], [address], [place], [place] [address]   Salicylate level     Status: Abnormal   Collection Time: 12/01/24  8:04 PM  Result Value Ref Range   Salicylate Lvl <7.0 (L) 7.0 - 30.0 mg/dL    Comment: Performed at [facility], [address] [place], [place] [address]    Blood Alcohol level:  Lab Results  Component Value Date   [facility] <15 12/01/2024   ETH 101 (H) 08/17/2024    Metabolic Disorder Labs:  Lab Results  Component Value Date   HGBA1C 5.1 08/17/2024   MPG 99.67 08/17/2024   MPG 96.8 02/16/2022   No results found for:  PROLACTIN Lab Results  Component Value Date   CHOL 159 08/18/2024   TRIG 54 08/18/2024   HDL 49 08/18/2024   CHOLHDL 3.2 08/18/2024   VLDL 11 08/18/2024   LDLCALC 99 08/18/2024   LDLCALC 99 02/16/2022    Current Medications: Current Facility-Administered Medications  Medication Dose Route Frequency Provider Last Rate Last Admin   acetaminophen  (TYLENOL ) tablet 650 mg  650 mg Oral Q6H PRN Trudy Carwin, NP       alum & mag hydroxide-simeth (MAALOX/MYLANTA) 200-200-20 MG/5ML suspension 30 mL  30 mL Oral Q4H PRN Trudy Carwin, NP       haloperidol  (HALDOL ) tablet 5 mg  5 mg Oral TID PRN Trudy Carwin, NP   5 mg at 12/02/24 0840   And   diphenhydrAMINE  (BENADRYL ) capsule 50 mg  50 mg Oral TID PRN Trudy Carwin, NP   50 mg at 12/02/24 0840   haloperidol  lactate (HALDOL ) injection 5 mg  5 mg Intramuscular TID PRN Trudy Carwin, NP       And   diphenhydrAMINE  (BENADRYL ) injection 50 mg  50 mg Intramuscular TID PRN Trudy Carwin, NP       And   LORazepam  (ATIVAN ) injection 2 mg  2 mg Intramuscular TID PRN Trudy Carwin, NP       haloperidol  lactate (HALDOL ) injection 10 mg  10 mg Intramuscular TID PRN Trudy Carwin, NP       And   diphenhydrAMINE  (BENADRYL ) injection 50  mg  50 mg Intramuscular TID PRN Trudy Carwin, NP       And   LORazepam  (ATIVAN ) injection 2 mg  2 mg Intramuscular TID PRN Trudy Carwin, NP       magnesium  hydroxide (MILK OF MAGNESIA) suspension 30 mL  30 mL Oral Daily PRN Trudy Carwin, NP       PTA Medications: Medications Prior to Admission  Medication Sig Dispense Refill Last Dose/Taking   aspirin  EC 81 MG tablet Take 1 tablet (81 mg total) by mouth daily. Swallow whole. 90 tablet 3    atorvastatin  (LIPITOR) 40 MG tablet Take 1 tablet (40 mg total) by mouth daily. 90 tablet 1     Psychiatric Specialty Exam:  Presentation  General Appearance:  Appropriate for Environment  Eye Contact: Fair  Speech: Clear and Coherent  Speech  Volume: Decreased    Mood and Affect  Mood: Euthymic  Affect: Appropriate   Thought Process  Thought Processes: Coherent  Descriptions of Associations:Intact  Orientation:Full (Time, Place and Person)  Thought Content:Logical  Hallucinations:Hallucinations: Auditory; Visual Description of Auditory Hallucinations: voices saying hurt myself Description of Visual Hallucinations: Spirits  Ideas of Reference:None  Suicidal Thoughts:Suicidal Thoughts: Yes, Passive SI Passive Intent and/or Plan: With Plan  Homicidal Thoughts:Homicidal Thoughts: No   Sensorium  Memory: Immediate Fair; Recent Fair; Remote Fair  Judgment: Poor  Insight: Poor   Executive Functions  Concentration: Fair  Attention Span: Fair  Recall: [employer] of Knowledge: Fair  Language: Fair   Psychomotor Activity  Psychomotor Activity: Psychomotor Activity: Normal   Assets  Assets: Manufacturing Systems Engineer; Housing; Social Support    Musculoskeletal: Strength & Muscle Tone: {desc; muscle tone:32375} Gait & Station: {PE GAIT ED WJUO:77474}  Physical Exam: Physical Exam ROS Blood pressure 129/88, pulse 60, temperature 97.7 F (36.5 C), temperature source Oral, resp. rate 17, height 5' 11 (1.803 m), weight 99.1 kg, SpO2 98%. Body mass index is 30.47 kg/m.  Principal Diagnosis: Bipolar disorder, mixed (HCC) Diagnosis:  Principal Problem:   Bipolar disorder, mixed (HCC)   Clinical Decision Making:  Treatment Plan Summary:  Safety and Monitoring:             -- Voluntary admission to inpatient psychiatric unit for safety, stabilization and treatment             -- Daily contact with patient to assess and evaluate symptoms and progress in treatment             -- Patient's case to be discussed in multi-disciplinary team meeting             -- Observation Level: q15 minute checks             -- Vital signs:  q12 hours             -- Precautions: suicide, elopement,  and assault   2. Psychiatric Diagnoses and Treatment:                   -- The risks/benefits/side-effects/alternatives to this medication were discussed in detail with the patient and time was given for questions. The patient consents to medication trial.                -- Metabolic profile and EKG monitoring obtained while on an atypical antipsychotic (BMI: Lipid Panel: HbgA1c: QTc:)              -- Encouraged patient to participate in unit milieu and in scheduled group therapies  3. Medical Issues Being Addressed:      4. Discharge Planning:              -- Social work and case management to assist with discharge planning and identification of hospital follow-up needs prior to discharge             -- Estimated LOS: 5-7 days             -- Discharge Concerns: Need to establish a safety plan; Medication compliance and effectiveness             -- Discharge Goals: Return home with outpatient referrals follow ups  Physician Treatment Plan for Primary Diagnosis: Bipolar disorder, mixed (HCC) Long Term Goal(s): {[facility] MD Tx Plan Long Term Goals:30414007::Improvement in symptoms so as ready for discharge}  Short Term Goals: [facility] MD Tx Plan Short Term Hnjod:[id number]}  Physician Treatment Plan for Secondary Diagnosis: Principal Problem:   Bipolar disorder, mixed (HCC)  Long Term Goal(s): {[facility] MD Tx Plan Long Term Goals:30414007::Improvement in symptoms so as ready for discharge}  Short Term Goals: [facility] MD Tx Plan Short Term Hnjod:[id number]}  I certify that inpatient services furnished can reasonably be expected to improve the patient's condition.    Tedra Coppernoll, NP 1/20/20263:27 PM     [1] No Known Allergies  "

## 2024-12-02 NOTE — Consult Note (Cosign Needed)
 Dominic [name]  Patient Name: Dominic [name] MRN: [id number] DOB: 08-30-1976 DATE OF Consult: 12/02/2024 Consult Order details:  Orders (From admission, onward)     Start     Ordered   12/01/24 1914  CONSULT TO CALL ACT TEAM       Ordering Provider: Minnie Tinnie BRAVO, PA  Provider:  (Not yet assigned)  Question:  Reason for Consult?  Answer:  SI, depression   12/01/24 1913            PRIMARY PSYCHIATRIC DIAGNOSES  1.  Bipolar I Disorder with psychotic features 2.  Cocaine Use Disorder 3.  Suicidal ideations  RECOMMENDATIONS  Formulation: The patient presents with acute exacerbation of chronic psychotic and mood symptoms following forced medication nonadherence after recent release from incarceration. He has a history of mood instability and psychosis previously stabilized on injectable aripiprazole  and quetiapine , with relapse occurring after a 30-day lapse due to formulary barriers. He now endorses severe auditory and visual hallucinations with command content and suicidal intent, culminating in a recent near-suicide attempt. Symptoms are further compounded by severe insomnia and ongoing cocaine and alcohol use, which likely exacerbate psychosis. Biological vulnerability is suggested by a family history of bipolar disorder. Psychosocial stressors include recent incarceration, medication access barriers, and unstable outpatient continuity of care. Overall presentation indicates high acute suicide risk requiring inpatient stabilization.  Medication recommendations:  Seroquel  100mg  PO at bedtime for psychosis, sleep and mood stabilization. Please stop all antipsychotic and QTc prolonging medications if patient's QTc is greater than 480 ms.  Non-Medication/therapeutic recommendations: Inpatient psychiatric stabilization and safety monitoring Substance use counseling with emphasis on cocaine cessation Psychoeducation on psychosis, sleep hygiene, and relapse  prevention Supportive therapy and family involvement for aftercare planning Refer to outpatient psychiatric provider for medication management, therapy, and substance abuse treatment upon discharge from inpatient psych admission.   Communication: Treatment team members (and family members if applicable) who were involved in treatment/care discussions and planning, and with whom we spoke or engaged with via secure text/chat, include the following: patient's treatment team  I personally spent a total of 45 minutes in the care of the patient today including preparing to see the patient, getting/reviewing separately obtained history, counseling and educating, placing orders, referring and communicating with other health care professionals, documenting clinical information in the EHR, and performing psych eval.  Thank you for involving us  in the care of this patient. If you have any additional questions or concerns, please call [phone] and ask for me or the provider on-call.  TELEPSYCHIATRY ATTESTATION & CONSENT  As the provider for this telehealth consult, I attest that I verified the patients identity using two separate identifiers, introduced myself to the patient, provided my credentials, disclosed my location, and performed this encounter via a HIPAA-compliant, real-time, face-to-face, two-way, interactive audio and video platform and with the full consent and agreement of the patient (or guardian as applicable.)  Patient physical location: [facility]. Telehealth provider physical location: home office in state of [place].  Video start time: 2336 (Central Time) Video end time: 2344 (Central Time)  IDENTIFYING DATA  Dominic [name] is a 49 y.o. year-old male for whom a psychiatric consultation has been ordered by the primary provider. The patient was identified using two separate identifiers.  CHIEF COMPLAINT/REASON FOR CONSULT  Suicidal ideations and hallucinations  HISTORY OF  PRESENT ILLNESS (HPI)  Per [facility] [name], patient  is a 49 y.o. male with past medical history of polysubstance abuse, hepatitis C,  asthma, schizoaffective disorder, CVA (09/06/2024) presents to emergency department via EMS for evaluation of SI, hallucinations, depression.  Reports that he was released from jail late October and was only released with a 30-day supply of his Seroquel , Cymbalta.  Reports he has been out of these medications for past 2 months.  Today, was found laying in the street with the intention to get hit by car to end his life.  He has been having auditory and visual hallucinations, depression SI for past week.  During this interview, patient reports presenting following recent release from incarceration, where he had been maintained on daily psychiatric medications for two and a half years. Upon release in October, he experienced a lapse in medication for approximately thirty days, after which he sought outpatient care but was unable to obtain his prescribed Seroquel  and injectable Abilify  due to formulary restrictions. Since discontinuation of his medications, he described a progressive return of psychotic symptoms, including auditory hallucinations characterized by hearing multiple voices simultaneously, often commanding him to harm himself. He also endorsed visual hallucinations, describing seeing spirits and demons. These symptoms intensified approximately one week prior to presentation, culminating in an incident where he was found on the road with the intention of getting hit by a car requiring intervention by family and emergency services.  He reported a longstanding history of mood instability, previously managed with Abilify  (injectable, dose unspecified) and Seroquel  100 mg nightly for sleep. He stated that without medication, he is at risk for hospitalization or incarceration. He reports severe sleep disruption since November. He acknowledged multiple prior suicide attempts, including  tying techniques and spending extended periods in dangerous situations. He attributed recent exacerbation of symptoms to medication nonadherence and possible substance use.  Substance use history is notable for ongoing tobacco use, alcohol consumption, and cocaine use (approximately one gram every three days, intranasal route). He also reported recent marijuana use. He resides with his aunt and has a family history of bipolar disorder in his twin sister. He expressed a desire for help and agreed to medication re-initiation and inpatient admission for stabilization. It was discussed that ongoing cocaine use may worsen psychotic symptoms, and this was acknowledged by the patient.  PAST PSYCHIATRIC HISTORY  - Diagnosed with bipolar disorder, first diagnosed in 2002. History of auditory and visual hallucinations, mood swings, and multiple suicide attempts. - Has history of inpatient psych hospitalizations. - Has history of substance use. - Received daily psychiatric medication while incarcerated for two and a half years, most recently released in October. No recent engagement with outpatient services. - Seroquel  100 mg for sleep, previously taken daily while incarcerated; discontinued after release, resulting in sleep disturbance. Abilify  (injectable) for mood swings, previously administered during incarceration; switched to oral form upon release. Dose unknown. Otherwise as per HPI above.  PAST MEDICAL HISTORY  Past Medical History:  Diagnosis Date   Alcohol intoxication, uncomplicated 01/13/2019   Anxiety    Asthma    Bipolar 1 disorder (HCC)    Chronic hepatitis C without hepatic coma (HCC) 05/28/2018   Depression    Hepatitis C    Hepatitis C antibody positive in blood 05/28/2018   Malingering 03/30/2022   Psychiatry [name] from [facility] states directly admitted to fabricating SI and CAH to obtain [psychiatric] admission   Schizophrenia [facility])    Sleep apnea      HOME MEDICATIONS  Facility Ordered  Medications  Medication   aspirin  EC tablet 81 mg   atorvastatin  (LIPITOR) tablet 40 mg   PTA  Medications  Medication Sig   aspirin  EC 81 MG tablet Take 1 tablet (81 mg total) by mouth daily. Swallow whole.   atorvastatin  (LIPITOR) 40 MG tablet Take 1 tablet (40 mg total) by mouth daily.     ALLERGIES  Allergies[1]  SOCIAL & SUBSTANCE USE HISTORY  Social History   Socioeconomic History   Marital status: Single    Spouse name: Not on file   Number of children: Not on file   Years of education: Not on file   Highest education level: Not on file  Occupational History   Not on file  Tobacco Use   Smoking status: Some Days    Current packs/day: 1.00    Average packs/day: 1 pack/day for 10.0 years (10.0 ttl pk-yrs)    Types: Cigarettes   Smokeless tobacco: Never  Vaping Use   Vaping status: Never Used  Substance and Sexual Activity   Alcohol use: Yes    Alcohol/week: 3.0 standard drinks of alcohol    Types: 3 Shots of liquor per week   Drug use: Yes    Types: Cocaine, Marijuana, Methamphetamines, Fentanyl     Comment: last night cocaine   Sexual activity: Not Currently    Birth control/protection: Condom  Other Topics Concern   Not on file  Social History Narrative   Not on file   Social Drivers of Health   Tobacco Use: High Risk (12/01/2024)   Patient History    Smoking Tobacco Use: Some Days    Smokeless Tobacco Use: Never    Passive Exposure: Not on file  Financial Resource Strain: Low Risk (03/31/2022)   Received from [facility]   Overall Financial Resource Strain (CARDIA)    Difficulty of Paying Living Expenses: Not very hard  Food Insecurity: Food Insecurity Present (08/17/2024)   Epic    Worried About Programme Researcher, Broadcasting/film/video in the Last Year: Often true    Ran Out of Food in the Last Year: Often true  Transportation Needs: Unmet Transportation Needs (08/17/2024)   Epic    Lack of Transportation (Medical): Yes    Lack of Transportation (Non-Medical): Yes   Physical Activity: Not on file  Stress: Not on file  Social Connections: Unknown (03/18/2022)   Received from [facility]   Social Network    Social Network: Not on file  Depression (PHQ2-9): High Risk (04/19/2022)   Depression (PHQ2-9)    PHQ-2 Score: 19  Alcohol Screen: Low Risk (09/08/2022)   Alcohol Screen    Last Alcohol Screening Score (AUDIT): 0  Housing: High Risk (08/17/2024)   Epic    Unable to Pay for Housing in the Last Year: No    Number of Times Moved in the Last Year: 1    Homeless in the Last Year: Yes  Utilities: Not At Risk (08/17/2024)   Epic    Threatened with loss of utilities: No  Health Literacy: Not on file   Tobacco Use History[2] Social History   Substance and Sexual Activity  Alcohol Use Yes   Alcohol/week: 3.0 standard drinks of alcohol   Types: 3 Shots of liquor per week   Social History   Substance and Sexual Activity  Drug Use Yes   Types: Cocaine, Marijuana, Methamphetamines, Fentanyl    Comment: last night cocaine   Uses cocaine, approximately 1 gram every three days, snorted He has been living with his aunt since his release from prison. A history of incarceration for two and a half years was noted.  FAMILY HISTORY  Family History  Problem Relation Age of Onset   Hypertension Sister    Schizophrenia Sister    Family Psychiatric History (if known):  Twin sister with bipolar disorder.  MENTAL STATUS EXAM (MSE)  Mental Status Exam: General Appearance: laying in bed without a shirt on  Orientation:  Full (Time, Place, and Person)  Memory:  intact  Concentration:  intact  Recall:  intact  Attention  Fair  Eye Contact:  Fair  Speech:  Clear and Coherent and Normal Rate  Language:  Good  Volume:  Normal  Mood: depressed  Affect:  Congruent  Thought Process:  Goal Directed  Thought Content:  Endorses auditory hallucinations, describing a million voices all at one time telling me to kill myself and stuff like that. Also reports  visual hallucinations, seeing spirits and demons   Suicidal Thoughts:  Yes.  without intent/plan  Homicidal Thoughts:  No  Judgement:  Fair  Insight:  Fair  Psychomotor Activity:  Normal  Akathisia:  Negative  Fund of Knowledge:  Fair    Assets:  Communication Skills Desire for Improvement Housing  Cognition:  WNL  ADL's:  Intact  AIMS (if indicated):       VITALS  Blood pressure 127/86, pulse 76, temperature 98.1 F (36.7 C), resp. rate 20, SpO2 97%.  LABS  Admission on 12/01/2024  Component Date Value Ref Range Status   Sodium 12/01/2024 139  135 - 145 mmol/L Final   Potassium 12/01/2024 4.0  3.5 - 5.1 mmol/L Final   Chloride 12/01/2024 105  98 - 111 mmol/L Final   CO2 12/01/2024 21 (L)  22 - 32 mmol/L Final   Glucose, Bld 12/01/2024 91  70 - 99 mg/dL Final   Glucose reference range applies only to samples taken after fasting for at least 8 hours.   BUN 12/01/2024 10  6 - 20 mg/dL Final   Creatinine, Ser 12/01/2024 0.86  0.61 - 1.24 mg/dL Final   Calcium  12/01/2024 8.8 (L)  8.9 - 10.3 mg/dL Final   Total Protein 98/80/7973 7.4  6.5 - 8.1 g/dL Final   Albumin 98/80/7973 4.2  3.5 - 5.0 g/dL Final   AST 98/80/7973 25  15 - 41 U/L Final   ALT 12/01/2024 11  0 - 44 U/L Final   Alkaline Phosphatase 12/01/2024 50  38 - 126 U/L Final   Total Bilirubin 12/01/2024 0.7  0.0 - 1.2 mg/dL Final   GFR, Estimated 12/01/2024 >60  >60 mL/min Final   Comment: ([name]) Calculated using the CKD-EPI Creatinine Equation (2021)    Anion gap 12/01/2024 13  5 - 15 Final   Performed at [facility], [address], [place], [place] [address]   Alcohol, Ethyl (B) 12/01/2024 <15  <15 mg/dL Final   Comment: ([name]) For medical purposes only. Performed at [facility], [address] [place], [place] [address]    Opiates 12/01/2024 NEGATIVE  NEGATIVE Final   Cocaine 12/01/2024 POSITIVE (A)  NEGATIVE Final   Benzodiazepines 12/01/2024 NEGATIVE  NEGATIVE Final   Amphetamines 12/01/2024 NEGATIVE   NEGATIVE Final   Tetrahydrocannabinol 12/01/2024 POSITIVE (A)  NEGATIVE Final   Barbiturates 12/01/2024 NEGATIVE  NEGATIVE Final   Methadone Scn, Ur 12/01/2024 NEGATIVE  NEGATIVE Final   Fentanyl  12/01/2024 NEGATIVE  NEGATIVE Final   Comment: ([name]) Drug screen is for Medical Purposes only. Positive results are preliminary only. If confirmation is needed, notify lab within 5 days.  Drug Class  Cutoff (ng/mL) Amphetamine and metabolites 1000 Barbiturate and metabolites 200 Benzodiazepine              200 Opiates and metabolites     300 Cocaine and metabolites     300 THC                         50 Fentanyl                     5 Methadone                   300  Trazodone  is metabolized in vivo to several metabolites,  including pharmacologically active m-CPP, which is excreted in the  urine.  Immunoassay screens for amphetamines and MDMA have potential  cross-reactivity with these compounds and may provide false positive  result.  Performed at [facility], [address], [place], [place] [address]    WBC 12/01/2024 11.0 (H)  4.0 - 10.5 K/uL Final   RBC 12/01/2024 4.95  4.22 - 5.81 MIL/uL Final   Hemoglobin 12/01/2024 14.5  13.0 - 17.0 g/dL Final   HCT 98/80/7973 40.9  39.0 - 52.0 % Final   MCV 12/01/2024 82.6  80.0 - 100.0 fL Final   MCH 12/01/2024 29.3  26.0 - 34.0 pg Final   MCHC 12/01/2024 35.5  30.0 - 36.0 g/dL Final   RDW 98/80/7973 14.4  11.5 - 15.5 % Final   Platelets 12/01/2024 195  150 - 400 K/uL Final   nRBC 12/01/2024 0.0  0.0 - 0.2 % Final   Neutrophils Relative % 12/01/2024 64  % Final   Neutro Abs 12/01/2024 7.0  1.7 - 7.7 K/uL Final   Lymphocytes Relative 12/01/2024 28  % Final   Lymphs Abs 12/01/2024 3.0  0.7 - 4.0 K/uL Final   Monocytes Relative 12/01/2024 8  % Final   Monocytes Absolute 12/01/2024 0.9  0.1 - 1.0 K/uL Final   Eosinophils Relative 12/01/2024 0  % Final   Eosinophils Absolute 12/01/2024 0.0  0.0 - 0.5 K/uL Final   Basophils  Relative 12/01/2024 0  % Final   Basophils Absolute 12/01/2024 0.0  0.0 - 0.1 K/uL Final   Immature Granulocytes 12/01/2024 0  % Final   Abs Immature Granulocytes 12/01/2024 0.03  0.00 - 0.07 K/uL Final   Performed at [facility], [address], [name], [place] [address]   Acetaminophen  (Tylenol ), Serum 12/01/2024 <10 (L)  10 - 30 ug/mL Final   Comment: ([name]) Toxic concentrations can be more effectively related to post dose interval; >200, >100, and >50 ug/mL serum concentrations correspond to toxic concentrations at 4, 8, and 12 hours post dose, respectively.  Performed at [facility], [address], [place], [place] [address]    Salicylate Lvl 12/01/2024 <7.0 (L)  7.0 - 30.0 mg/dL Final   Performed at [facility], [address] [place], [place] [address]    PSYCHIATRIC REVIEW OF SYSTEMS (ROS)   - Auditory hallucinations described as multiple voices commanding self-harm. - Visual hallucinations involving spirits and demons. - Sleep disturbance since November. - Depression and Mood swings reported. - Suicidal ideation and multiple suicide attempts. - No current engagement in psychiatric care since release from prison. - Substance use includes tobacco, alcohol, cannabis, and cocaine (snorted, approximately 1 gram every three days).  Additional findings:      Musculoskeletal: No abnormal movements observed      Gait & Station: Laying/Sitting  Pain Screening: Denies      Nutrition & Dental Concerns: none reported  RISK FORMULATION/ASSESSMENT  Is the patient experiencing any suicidal or homicidal ideations: Yes       Explain if yes:  Protective factors considered for safety management:  include living with his aunt and willingness to seek help  Risk factors/concerns considered for safety management: male gender, recent release from prison, history of chronic mental health issues (bipolar disorder), family history of mental illness, substance use (cocaine, alcohol, tobacco),  and multiple prior suicide attempts. Modifiable risk factors include ongoing substance use Is there a safety management plan with the patient and treatment team to minimize risk factors and promote protective factors: Yes           Explain: admit to psych Is crisis care placement or psychiatric hospitalization recommended: Yes     Based on my current evaluation and risk assessment, patient is determined at this time to be at:  High risk  *RISK ASSESSMENT Risk assessment is a dynamic process; it is possible that this patient's condition, and risk level, may change. This should be re-evaluated and managed over time as appropriate. Please re-consult psychiatric consult services if additional assistance is needed in terms of risk assessment and management. If your team decides to discharge this patient, please advise the patient how to best access emergency psychiatric services, or to call 911, if their condition worsens or they feel unsafe in any way.   Folasade Mooty Velna, NP Telepsychiatry Consult Services    [1] No Known Allergies [2]  Social History Tobacco Use  Smoking Status Some Days   Current packs/day: 1.00   Average packs/day: 1 pack/day for 10.0 years (10.0 ttl pk-yrs)   Types: Cigarettes  Smokeless Tobacco Never

## 2024-12-02 NOTE — Group Note (Signed)
 Date:  12/02/2024 Time:  8:58 PM  Group Topic/Focus:  Orientation:   The focus of this group is to educate the patient on the purpose and policies of crisis stabilization and provide a format to answer questions about their admission.  The group details unit policies and expectations of patients while admitted. Wrap-Up Group:   The focus of this group is to help patients review their daily goal of treatment and discuss progress on daily workbooks.    Participation Level:  Did Not Attend   Dominic [name] 12/02/2024, 8:58 PM

## 2024-12-03 DIAGNOSIS — F3160 Bipolar disorder, current episode mixed, unspecified: Secondary | ICD-10-CM | POA: Diagnosis not present

## 2024-12-03 NOTE — [facility] Suicide Risk Assessment (Signed)
 [facility] INPATIENT:  Family/Significant Other Suicide Prevention Education  Suicide Prevention Education:  Education Completed; Ronal Silvan, [phone], Aunt, has been identified by the patient as the family member/significant other with whom the patient will be residing, and identified as the person(s) who will aid the patient in the event of a mental health crisis (suicidal ideations/suicide attempt).  With written consent from the patient, the family member/significant other has been provided the following suicide prevention education, prior to the and/or following the discharge of the patient.  The suicide prevention education provided includes the following: Suicide risk factors Suicide prevention and interventions National Suicide Hotline telephone number [facility] assessment telephone number [facility] Emergency Assistance 911 [facility] and/or Residential Mobile Crisis Unit telephone number  Request made of family/significant other to: Remove weapons (e.g., guns, rifles, knives), all items previously/currently identified as safety concern.   Remove drugs/medications (over-the-counter, prescriptions, illicit drugs), all items previously/currently identified as a safety concern.  The family member/significant other verbalizes understanding of the suicide prevention education information provided.  The family member/significant other agrees to remove the items of safety concern listed above.  According to aunt, his mind tripped a little bit. Aunt reported that I trust him. He helps me get around. Aunt reported that there are weapons in the home. Aunt reported I need his help. Aunt reported no present safety concerns. Aunt reported that she does not believe the patient to be a sanger to himself or others and that he can return to the home at discharge.   Dominic [name] 12/03/2024, 1:33 PM

## 2024-12-03 NOTE — BH IP Treatment Plan (Signed)
 Interdisciplinary Treatment and Diagnostic Plan Update  12/03/2024 Time of Session: 10:21 AM Dominic [name] MRN: [id number]  Principal Diagnosis: Bipolar disorder, mixed (HCC)  Secondary Diagnoses: Principal Problem:   Bipolar disorder, mixed (HCC)   Current Medications:  Current Facility-Administered Medications  Medication Dose Route Frequency Provider Last Rate Last Admin   acetaminophen  (TYLENOL ) tablet 650 mg  650 mg Oral Q6H PRN Trudy Carwin, NP       alum & mag hydroxide-simeth (MAALOX/MYLANTA) 200-200-20 MG/5ML suspension 30 mL  30 mL Oral Q4H PRN Trudy Carwin, NP       ARIPiprazole  (ABILIFY ) tablet 2 mg  2 mg Oral Daily May, Tanya, NP   2 mg at 12/03/24 9140   haloperidol  (HALDOL ) tablet 5 mg  5 mg Oral TID PRN Trudy Carwin, NP   5 mg at 12/02/24 0840   And   diphenhydrAMINE  (BENADRYL ) capsule 50 mg  50 mg Oral TID PRN Trudy Carwin, NP   50 mg at 12/02/24 0840   haloperidol  lactate (HALDOL ) injection 5 mg  5 mg Intramuscular TID PRN Trudy Carwin, NP       And   diphenhydrAMINE  (BENADRYL ) injection 50 mg  50 mg Intramuscular TID PRN Trudy Carwin, NP       And   LORazepam  (ATIVAN ) injection 2 mg  2 mg Intramuscular TID PRN Trudy Carwin, NP       haloperidol  lactate (HALDOL ) injection 10 mg  10 mg Intramuscular TID PRN Trudy Carwin, NP       And   diphenhydrAMINE  (BENADRYL ) injection 50 mg  50 mg Intramuscular TID PRN Trudy Carwin, NP       And   LORazepam  (ATIVAN ) injection 2 mg  2 mg Intramuscular TID PRN Trudy Carwin, NP       magnesium  hydroxide (MILK OF MAGNESIA) suspension 30 mL  30 mL Oral Daily PRN Trudy Carwin, NP       PTA Medications: Medications Prior to Admission  Medication Sig Dispense Refill Last Dose/Taking   aspirin  EC 81 MG tablet Take 1 tablet (81 mg total) by mouth daily. Swallow whole. 90 tablet 3    atorvastatin  (LIPITOR) 40 MG tablet Take 1 tablet (40 mg total) by mouth daily. 90 tablet 1     Patient Stressors: Medication change or  noncompliance   Substance abuse    Patient Strengths: Ability for insight  Motivation for treatment/growth   Treatment Modalities: Medication Management, Group therapy, Case management,  1 to 1 session with clinician, Psychoeducation, Recreational therapy.   Physician Treatment Plan for Primary Diagnosis: Bipolar disorder, mixed (HCC) Long Term Goal(s):     Short Term Goals:    Medication Management: Evaluate patient's response, side effects, and tolerance of medication regimen.  Therapeutic Interventions: 1 to 1 sessions, Unit Group sessions and Medication administration.  Evaluation of Outcomes: Not Met  Physician Treatment Plan for Secondary Diagnosis: Principal Problem:   Bipolar disorder, mixed (HCC)  Long Term Goal(s):     Short Term Goals:       Medication Management: Evaluate patient's response, side effects, and tolerance of medication regimen.  Therapeutic Interventions: 1 to 1 sessions, Unit Group sessions and Medication administration.  Evaluation of Outcomes: Not Met   RN Treatment Plan for Primary Diagnosis: Bipolar disorder, mixed (HCC) Long Term Goal(s): Knowledge of disease and therapeutic regimen to maintain health will improve  Short Term Goals: Ability to verbalize frustration and anger appropriately will improve, Ability to demonstrate self-control, Ability to participate in decision making will improve,  Ability to verbalize feelings will improve, Ability to disclose and discuss suicidal ideas, and Ability to identify and develop effective coping behaviors will improve  Medication Management: RN will administer medications as ordered by provider, will assess and evaluate patient's response and provide education to patient for prescribed medication. RN will report any adverse and/or side effects to prescribing provider.  Therapeutic Interventions: 1 on 1 counseling sessions, Psychoeducation, Medication administration, Evaluate responses to treatment,  Monitor vital signs and CBGs as ordered, Perform/monitor CIWA, COWS, AIMS and Fall Risk screenings as ordered, Perform wound care treatments as ordered.  Evaluation of Outcomes: Not Met   LCSW Treatment Plan for Primary Diagnosis: Bipolar disorder, mixed (HCC) Long Term Goal(s): Safe transition to appropriate next level of care at discharge, Engage patient in therapeutic group addressing interpersonal concerns.  Short Term Goals: Engage patient in aftercare planning with referrals and resources, Increase social support, Increase ability to appropriately verbalize feelings, Increase emotional regulation, Facilitate acceptance of mental health diagnosis and concerns, Facilitate patient progression through stages of change regarding substance use diagnoses and concerns, Identify triggers associated with mental health/substance abuse issues, and Increase skills for wellness and recovery  Therapeutic Interventions: Assess for all discharge needs, 1 to 1 time with Social worker, Explore available resources and support systems, Assess for adequacy in community support network, Educate family and significant other(s) on suicide prevention, Complete Psychosocial Assessment, Interpersonal group therapy.  Evaluation of Outcomes: Not Met   Progress in Treatment: Attending groups: No. Participating in groups: No. Taking medication as prescribed: Yes. Toleration medication: Yes. Family/Significant other contact made: No, will contact:  Patient's aunt and probation officer.  Patient understands diagnosis: Yes. Discussing patient identified problems/goals with staff: Yes. Medical problems stabilized or resolved: Yes. Denies suicidal/homicidal ideation: Yes. Issues/concerns per patient self-inventory: No. Other: None  New problem(s) identified: No, Describe:  None  New Short Term/Long Term Goal(s):detox, elimination of symptoms of psychosis, medication management for mood stabilization; elimination of  SI thoughts; development of comprehensive mental wellness/sobriety plan.    Patient Goals:  Be on my medicine.  Discharge Plan or Barriers: CSW to assist with the development of appropriate discharge plan.    Reason for Continuation of Hospitalization: Anxiety Delusions  Depression Hallucinations Suicidal ideation Withdrawal symptoms  Estimated Length of Stay: 1-7 days.   Last 3 [place] Suicide Severity Risk Score: Flowsheet Row Admission (Current) from 12/02/2024 in [facility] INPATIENT BEHAVIORAL MEDICINE ED from 12/01/2024 in [facility] Emergency Department at [facility] ED to Hosp-Admission (Discharged) from 08/17/2024 in [facility] 1C MEDICAL TELEMETRY  C-SSRS RISK CATEGORY High Risk High Risk No Risk    Last PHQ 2/9 Scores:    04/19/2022    6:21 AM 02/16/2022    2:05 PM 08/03/2021    3:07 PM  Depression screen PHQ 2/9  Decreased Interest 2 2 0  Down, Depressed, Hopeless 3 3 0  PHQ - 2 Score 5 5 0  Altered sleeping 2 3   Tired, decreased energy 2 2   Change in appetite 2 3   Feeling bad or failure about yourself  3 3   Trouble concentrating 2 3   Moving slowly or fidgety/restless 1 3   Suicidal thoughts 2 2   PHQ-9 Score 19  24    Difficult doing work/chores Very difficult Extremely dIfficult      Data saved with a previous flowsheet row definition    Scribe for Treatment Team: Clifford Benninger M Arch Methot, KEN 12/03/2024 10:57 AM

## 2024-12-03 NOTE — Progress Notes (Signed)
" °   12/03/24 0000  Psych Admission Type (Psych Patients Only)  Admission Status Voluntary  Psychosocial Assessment  Patient Complaints Anxiety  Eye Contact Fair  Facial Expression Sad;Flat  Affect Sad  Speech Logical/coherent  Interaction Guarded  Motor Activity Slow  Appearance/Hygiene In scrubs  Behavior Characteristics Cooperative  Mood Depressed  Aggressive Behavior  Effect No apparent injury  Thought Process  Coherency WDL  Content WDL  Delusions None reported or observed  Perception WDL  Hallucination None reported or observed  Judgment WDL  Confusion Mild  Danger to Self  Current suicidal ideation? Denies  Danger to Others  Danger to Others None reported or observed    "

## 2024-12-03 NOTE — BHH Counselor (Signed)
 Referral sent to [facility] on patient's behalf.   Klarissa Mcilvain, MSW, LCSWA 12/03/2024 1:35 PM

## 2024-12-03 NOTE — BHH Suicide Risk Assessment (Signed)
 [facility] INPATIENT:  Family/Significant Other Suicide Prevention Education  Suicide Prevention Education:  Education Completed; Elsie Fish, [phone], Probation officer. has been identified by the patient as the family member/significant other with whom the patient will be residing, and identified as the person(s) who will aid the patient in the event of a mental health crisis (suicidal ideations/suicide attempt).  With written consent from the patient, the family member/significant other has been provided the following suicide prevention education, prior to the and/or following the discharge of the patient.  The suicide prevention education provided includes the following: Suicide risk factors Suicide prevention and interventions National Suicide Hotline telephone number [facility] assessment telephone number [facility] Emergency Assistance 911 [facility] and/or Residential Mobile Crisis Unit telephone number  Request made of family/significant other to: Remove weapons (e.g., guns, rifles, knives), all items previously/currently identified as safety concern.   Remove drugs/medications (over-the-counter, prescriptions, illicit drugs), all items previously/currently identified as a safety concern.  The family member/significant other verbalizes understanding of the suicide prevention education information provided.  The family member/significant other agrees to remove the items of safety concern listed above.  Jaana Brodt M Evelyne Makepeace 12/03/2024, 12:40 PM

## 2024-12-03 NOTE — Plan of Care (Signed)

## 2024-12-03 NOTE — Progress Notes (Addendum)
 [facility] MD Progress Note  12/03/2024 11:48 PM Dominic [name]  MRN:  [id number]   Subjective:  Chart reviewed, case discussed in multidisciplinary meeting, patient seen during rounds.   1/21: Patient reports he is doing good. He identifies that he is discharge focused and would like to return to aunts house as he helps her with ADLs. He reports coming in to get back on medications and linked with services. He denies SI, HI, and AVH. He reports feeling better being back on medications. Denies other concerns/complaints.    Past Psychiatric History: see h&P Family History:  Family History  Problem Relation Age of Onset   Hypertension Sister    Schizophrenia Sister    Social History:  Social History   Substance and Sexual Activity  Alcohol Use Yes   Alcohol/week: 3.0 standard drinks of alcohol   Types: 3 Shots of liquor per week     Social History   Substance and Sexual Activity  Drug Use Yes   Types: Cocaine, Marijuana, Methamphetamines, Fentanyl    Comment: last night cocaine    Social History   Socioeconomic History   Marital status: Single    Spouse name: Not on file   Number of children: Not on file   Years of education: Not on file   Highest education level: Not on file  Occupational History   Not on file  Tobacco Use   Smoking status: Some Days    Current packs/day: 1.00    Average packs/day: 1 pack/day for 10.0 years (10.0 ttl pk-yrs)    Types: Cigarettes   Smokeless tobacco: Never  Vaping Use   Vaping status: Never Used  Substance and Sexual Activity   Alcohol use: Yes    Alcohol/week: 3.0 standard drinks of alcohol    Types: 3 Shots of liquor per week   Drug use: Yes    Types: Cocaine, Marijuana, Methamphetamines, Fentanyl     Comment: last night cocaine   Sexual activity: Not Currently    Birth control/protection: Condom  Other Topics Concern   Not on file  Social History Narrative   Not on file   Social Drivers of Health   Tobacco Use: High  Risk (12/02/2024)   Patient History    Smoking Tobacco Use: Some Days    Smokeless Tobacco Use: Never    Passive Exposure: Not on file  Financial Resource Strain: Low Risk (03/31/2022)   Received from [facility]   Overall Financial Resource Strain (CARDIA)    Difficulty of Paying Living Expenses: Not very hard  Food Insecurity: No Food Insecurity (12/02/2024)   Epic    Worried About Radiation Protection Practitioner of Food in the Last Year: Never true    Ran Out of Food in the Last Year: Never true  Transportation Needs: No Transportation Needs (12/02/2024)   Epic    Lack of Transportation (Medical): No    Lack of Transportation (Non-Medical): No  Physical Activity: Not on file  Stress: Not on file  Social Connections: Socially Isolated (12/02/2024)   Social Connection and Isolation Panel    Frequency of Communication with Friends and Family: Never    Frequency of Social Gatherings with Friends and Family: Never    Attends Religious Services: Never    Database Administrator or Organizations: No    Attends Banker Meetings: Never    Marital Status: Never married  Depression (PHQ2-9): High Risk (04/19/2022)   Depression (PHQ2-9)    PHQ-2 Score: 19  Alcohol Screen: Low Risk (  12/02/2024)   Alcohol Screen    Last Alcohol Screening Score (AUDIT): 0  Housing: Low Risk (12/02/2024)   Epic    Unable to Pay for Housing in the Last Year: No    Number of Times Moved in the Last Year: 0    Homeless in the Last Year: No  Utilities: Not At Risk (12/02/2024)   Epic    Threatened with loss of utilities: No  Health Literacy: Not on file   Past Medical History:  Past Medical History:  Diagnosis Date   Alcohol intoxication, uncomplicated 01/13/2019   Anxiety    Asthma    Bipolar 1 disorder (HCC)    Chronic hepatitis C without hepatic coma (HCC) 05/28/2018   Depression    Hepatitis C    Hepatitis C antibody positive in blood 05/28/2018   Malingering 03/30/2022   Psychiatry note from [facility] states  directly admitted to fabricating SI and CAH to obtain [psychiatric] admission   Schizophrenia (HCC)    Sleep apnea     Past Surgical History:  Procedure Laterality Date   CYST REMOVAL NECK     neck   INTRAMEDULLARY (IM) NAIL FIBULA Right 07/14/2022   Procedure: INTRAMEDULLARY (IM) NAIL FIBULA;  Surgeon: Rollene Cough, MD;  Location: [facility] ORS;  Service: Orthopedics;  Laterality: Right;   TIBIA IM NAIL INSERTION Right 07/14/2022   Procedure: INTRAMEDULLARY (IM) NAIL TIBIAL;  Surgeon: Rollene Cough, MD;  Location: [facility] ORS;  Service: Orthopedics;  Laterality: Right;    Current Medications: Current Facility-Administered Medications  Medication Dose Route Frequency Provider Last Rate Last Admin   acetaminophen  (TYLENOL ) tablet 650 mg  650 mg Oral Q6H PRN Trudy Carwin, NP       alum & mag hydroxide-simeth (MAALOX/MYLANTA) 200-200-20 MG/5ML suspension 30 mL  30 mL Oral Q4H PRN Trudy Carwin, NP       ARIPiprazole  (ABILIFY ) tablet 2 mg  2 mg Oral Daily Taelor Moncada, NP   2 mg at 12/03/24 9140   haloperidol  (HALDOL ) tablet 5 mg  5 mg Oral TID PRN Trudy Carwin, NP   5 mg at 12/02/24 0840   And   diphenhydrAMINE  (BENADRYL ) capsule 50 mg  50 mg Oral TID PRN Trudy Carwin, NP   50 mg at 12/02/24 0840   haloperidol  lactate (HALDOL ) injection 5 mg  5 mg Intramuscular TID PRN Trudy Carwin, NP       And   diphenhydrAMINE  (BENADRYL ) injection 50 mg  50 mg Intramuscular TID PRN Trudy Carwin, NP       And   LORazepam  (ATIVAN ) injection 2 mg  2 mg Intramuscular TID PRN Trudy Carwin, NP       haloperidol  lactate (HALDOL ) injection 10 mg  10 mg Intramuscular TID PRN Trudy Carwin, NP       And   diphenhydrAMINE  (BENADRYL ) injection 50 mg  50 mg Intramuscular TID PRN Trudy Carwin, NP       And   LORazepam  (ATIVAN ) injection 2 mg  2 mg Intramuscular TID PRN Trudy Carwin, NP       magnesium  hydroxide (MILK OF MAGNESIA) suspension 30 mL  30 mL Oral Daily PRN Trudy Carwin, NP        Lab Results:  No results found for this or any previous visit (from the past 48 hours).  Blood Alcohol level:  Lab Results  Component Value Date   [facility] <15 12/01/2024   ETH 101 (H) 08/17/2024    Metabolic Disorder Labs: Lab Results  Component Value Date  HGBA1C 5.1 08/17/2024   MPG 99.67 08/17/2024   MPG 96.8 02/16/2022   No results found for: PROLACTIN Lab Results  Component Value Date   CHOL 159 08/18/2024   TRIG 54 08/18/2024   HDL 49 08/18/2024   CHOLHDL 3.2 08/18/2024   VLDL 11 08/18/2024   LDLCALC 99 08/18/2024   LDLCALC 99 02/16/2022    Physical Findings: AIMS:  , ,  ,  ,    CIWA:    COWS:      Psychiatric Specialty Exam:  Presentation  General Appearance:  Appropriate for Environment; Casual  Eye Contact: Fair  Speech: Clear and Coherent  Speech Volume: Normal    Mood and Affect  Mood: Euthymic  Affect: Appropriate   Thought Process  Thought Processes: Coherent; Goal Directed; Linear  Orientation:Full (Time, Place and Person)  Thought Content:Logical  Hallucinations:Hallucinations: None   Ideas of Reference:None  Suicidal Thoughts:Suicidal Thoughts: No   Homicidal Thoughts:Homicidal Thoughts: No   Sensorium  Memory: Immediate Fair; Recent Fair; Remote Fair  Judgment: Fair  Insight: Fair   Art Therapist  Concentration: Fair  Attention Span: Fair  Recall: [employer] of Knowledge: Fair  Language: Fair   Psychomotor Activity  Psychomotor Activity: Psychomotor Activity: Normal  Musculoskeletal: Strength & Muscle Tone: within normal limits Gait & Station: normal Assets  Assets: Manufacturing Systems Engineer; Housing; Social Support    Physical Exam: Physical Exam Vitals and nursing note reviewed.  Constitutional:      Appearance: Normal appearance.  Pulmonary:     Effort: Pulmonary effort is normal.  Neurological:     Mental Status: He is alert and oriented to person, place, and time.  Psychiatric:         Mood and Affect: Mood normal.        Behavior: Behavior normal.        Thought Content: Thought content normal.    Review of Systems  Respiratory:  Negative for shortness of breath.   Cardiovascular:  Negative for chest pain.  Gastrointestinal:  Negative for diarrhea, nausea and vomiting.  Psychiatric/Behavioral:  Positive for substance abuse. Negative for depression, hallucinations and suicidal ideas. The patient is not nervous/anxious.   All other systems reviewed and are negative.  Blood pressure 119/82, pulse (!) 59, temperature 98.2 F (36.8 C), temperature source Oral, resp. rate 20, height 5' 11 (1.803 m), weight 99.1 kg, SpO2 97%. Body mass index is 30.47 kg/m.  Diagnosis: Principal Problem:   Bipolar disorder, mixed (HCC)   PLAN: Safety and Monitoring:  -- Involuntary admission to inpatient psychiatric unit for safety, stabilization and treatment  -- Daily contact with patient to assess and evaluate symptoms and progress in treatment  -- Patient's case to be discussed in multi-disciplinary team meeting  -- Observation Level : q15 minute checks  -- Vital signs:  q12 hours  -- Precautions: suicide, elopement, and assault -- Encouraged patient to participate in unit milieu and in scheduled group therapies  2. Psychiatric Treatment:  Scheduled Medications: Abilify  2 mg daily     -- The risks/benefits/side-effects/alternatives to this medication were discussed in detail with the patient and time was given for questions. The patient consents to medication trial.  3. Medical Issues Being Addressed:  No concerns expressed at this time.    4. Discharge Planning:   -- Social work and case management to assist with discharge planning and identification of hospital follow-up needs prior to discharge  -- Estimated LOS: 3-4 days  Emonee Winkowski, NP 12/03/2024, 11:48 PM

## 2024-12-03 NOTE — Group Note (Signed)
 Date:  12/03/2024 Time:  1:43 PM  Group Topic/Focus:  Stages of Change:   The focus of this group is to explain the stages of change and help patients identify changes they want to make upon discharge.    Participation Level:  Active  Participation Quality:  Appropriate  Affect:  Appropriate  Cognitive:  Appropriate  Insight: Appropriate  Engagement in Group:  Engaged  Modes of Intervention:  Activity  Additional Comments:    Camellia HERO Kewana Sanon 12/03/2024, 1:43 PM

## 2024-12-03 NOTE — Group Note (Signed)
 Date:  12/03/2024 Time:  5:21 PM  Group Topic/Focus:  Building Self Esteem:   The Focus of this group is helping patients become aware of the effects of self-esteem on their lives, the things they and others do that enhance or undermine their self-esteem, seeing the relationship between their level of self-esteem and the choices they make and learning ways to enhance self-esteem.    Participation Level:  Minimal  Participation Quality:  Appropriate  Affect:  Appropriate  Cognitive:  Appropriate  Insight: Appropriate  Engagement in Group:  Limited  Modes of Intervention:  Activity  Jon A Makinsley Schiavi 12/03/2024, 5:21 PM

## 2024-12-03 NOTE — BHH Counselor (Signed)
 CSW touched base with Elsie Fish, [phone], Probation officer.   Officer Fish reported that the patient can attend treatment but that he has to be in [place] . Officer reported that he will need to come this week to remove the patient's ankle monitor while inpatient.   CSW to touch base with patient's officer to make aware of discharge plans.   Pablo Stauffer, MSW, LCSWA 12/03/2024 12:40 PM

## 2024-12-03 NOTE — Group Note (Signed)

## 2024-12-03 NOTE — Plan of Care (Signed)
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
## 2024-12-03 NOTE — Group Note (Signed)
 Date:  12/03/2024 Time:  9:16 PM  Group Topic/Focus:  Wrap-Up Group:   The focus of this group is to help patients review their daily goal of treatment and discuss progress on daily workbooks.    Participation Level:  Active  Participation Quality:  Appropriate  Affect:  Appropriate  Cognitive:  Appropriate  Insight: Appropriate  Engagement in Group:  Engaged  Modes of Intervention:  Discussion and Support  Additional Comments:    Ginny JONETTA Galeazzi 12/03/2024, 9:16 PM

## 2024-12-04 DIAGNOSIS — F3160 Bipolar disorder, current episode mixed, unspecified: Secondary | ICD-10-CM | POA: Diagnosis not present

## 2024-12-04 MED ORDER — TRAZODONE HCL 50 MG PO TABS
50.0000 mg | ORAL_TABLET | Freq: Once | ORAL | Status: AC
  Administered 2024-12-04: 50 mg via ORAL
  Filled 2024-12-04: qty 1

## 2024-12-04 MED ORDER — BENZONATATE 100 MG PO CAPS
100.0000 mg | ORAL_CAPSULE | Freq: Two times a day (BID) | ORAL | Status: DC | PRN
  Administered 2024-12-05 (×2): 100 mg via ORAL
  Filled 2024-12-04 (×3): qty 1

## 2024-12-04 NOTE — Group Note (Signed)
 Date:  12/04/2024 Time:  10:06 AM  Group Topic/Focus:  Emotional Education:   The focus of this group is to discuss what feelings/emotions are, and how they are experienced.    Participation Level:  Active  Participation Quality:  Appropriate  Affect:  Appropriate  Cognitive:  Alert  Insight: Appropriate  Engagement in Group:  Engaged  Modes of Intervention:  Activity, Discussion, and Education  Additional Comments:    Dominic [name] 12/04/2024, 10:06 AM

## 2024-12-04 NOTE — Plan of Care (Signed)

## 2024-12-04 NOTE — BHH Counselor (Signed)
 CSW and patient engaged in safe discharge planning.   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 According to patient, his aunt is on breathing machine and disabled. With this, patient reported that he no longer wants residential treatment and that my aunt needs me.   Patient initially came in due to lapse in medication after his recent release from prison where he was steadily maintained on his psych meds.  Patient agreed to outpatient support and ACTT referral. CSW to coordinate both on patient's behalf.   Shamekia Tippets, MSW, LCSWA 12/04/2024 9:41 AM

## 2024-12-04 NOTE — Plan of Care (Signed)
  Problem: Education: Goal: Mental status will improve Outcome: Progressing   

## 2024-12-04 NOTE — Group Note (Signed)
 [facility] LCSW Group Therapy Note   Group Date: 12/04/2024 Start Time: 1300 End Time: 1400   Type of Therapy/Topic:  Group Therapy:  Emotion Regulation  Participation Level:  Active   Mood:  Description of Group:    The purpose of this group is to assist patients in learning to regulate negative emotions and experience positive emotions. Patients will be guided to discuss ways in which they have been vulnerable to their negative emotions. These vulnerabilities will be juxtaposed with experiences of positive emotions or situations, and patients challenged to use positive emotions to combat negative ones. Special emphasis will be placed on coping with negative emotions in conflict situations, and patients will process healthy conflict resolution skills.  Therapeutic Goals: Patient will identify two positive emotions or experiences to reflect on in order to balance out negative emotions:  Patient will label two or more emotions that they find the most difficult to experience:  Patient will be able to demonstrate positive conflict resolution skills through discussion or role plays:   Summary of Patient Progress:   During group, patient and group explored the ways in which our thoughts can impact our feelings which impacts our behaviors. The patient actively engaged in the group session and shared personal strategies for regulating a range of emotions. The group, along with the facilitator, explored various situations that triggered both safe and unsafe feelings, and discussed effective methods for emotional regulation to support improved outcomes. The patient was open, supportive of peers, and receptive to feedback, contributing positively to the group dynamic.      Therapeutic Modalities:   Cognitive Behavioral Therapy Feelings Identification Dialectical Behavioral Therapy   Alveta CHRISTELLA Kerns, LCSW

## 2024-12-04 NOTE — Progress Notes (Signed)
" °   12/04/24 0443  Psych Admission Type (Psych Patients Only)  Admission Status Voluntary  Psychosocial Assessment  Patient Complaints Anxiety  Eye Contact Fair  Facial Expression Sad;Flat  Affect Sad  Speech Logical/coherent  Interaction Guarded  Motor Activity Slow  Appearance/Hygiene In scrubs  Behavior Characteristics Anxious;Unwilling to participate  Mood Preoccupied  Aggressive Behavior  Effect No apparent injury  Thought Process  Coherency WDL  Content WDL  Delusions None reported or observed  Perception WDL  Hallucination None reported or observed  Judgment WDL  Confusion Mild  Danger to Self  Current suicidal ideation? Denies  Danger to Others  Danger to Others None reported or observed    "

## 2024-12-04 NOTE — Progress Notes (Signed)
" °   12/04/24 0800  Psych Admission Type (Psych Patients Only)  Admission Status Voluntary  Psychosocial Assessment  Patient Complaints None  Eye Contact Fair  Facial Expression Animated  Affect Appropriate to circumstance  Speech Logical/coherent  Interaction Assertive  Motor Activity Other (Comment) (steady)  Appearance/Hygiene In scrubs  Behavior Characteristics Anxious  Mood Preoccupied (with leaving)  Thought Process  Coherency WDL  Content WDL  Delusions None reported or observed  Perception WDL  Hallucination None reported or observed  Judgment WDL  Confusion Mild  Danger to Self  Current suicidal ideation? Denies    "

## 2024-12-04 NOTE — Group Note (Signed)
 Recreation Therapy Group Note   Group Topic:Relaxation  Group Date: 12/04/2024 Start Time: 1530 End Time: 1605 Facilitators: Celestia Jeoffrey BRAVO, LRT, CTRS Location: Dayroom  Group Description: PMR (Progressive Muscle Relaxation). LRT educates patients on what PMR is and the benefits that come from it. Patients are asked to sit with their feet flat on the floor while sitting up and all the way back in their chair, if possible. LRT and pts follow a prompt through a speaker that requires you to tense and release different muscles in their body and focus on their breathing. During session, lights are off and soft music is being played. Pts are given a stress ball to use if needed.   Goal Area(s) Addressed:  Patients will be able to describe progressive muscle relaxation.  Patient will practice using relaxation technique. Patient will identify a new coping skill.  Patient will follow multistep directions to reduce anxiety and stress.   Affect/Mood: Appropriate   Participation Level: Active and Engaged   Participation Quality: Independent   Behavior: Calm and Cooperative   Speech/Thought Process: Coherent   Insight: Good   Judgement: Good   Modes of Intervention: Clarification, Education, and Exploration   Patient Response to Interventions:  Attentive, Engaged, and Receptive   Education Outcome:  Acknowledges education   Clinical Observations/Individualized Feedback: Fardeen was active in their participation of session activities and group discussion. Pt completed all exercises as prompted.    Plan: Continue to engage patient in RT group sessions 2-3x/week.   Jeoffrey BRAVO Celestia, LRT, CTRS 12/04/2024 5:23 PM

## 2024-12-04 NOTE — Group Note (Signed)
 Date:  12/04/2024 Time:  8:51 PM  Group Topic/Focus:  Personal Choices and Values:   The focus of this group is to help patients assess and explore the importance of values in their lives, how their values affect their decisions, how they express their values and what opposes their expression.    Participation Level:  Active  Participation Quality:  Appropriate  Affect:  Appropriate  Cognitive:  Appropriate  Insight: Appropriate  Engagement in Group:  Engaged  Modes of Intervention:  Discussion  Additional Comments:    Toribio Seiber L 12/04/2024, 8:51 PM

## 2024-12-04 NOTE — Group Note (Signed)
 Recreation Therapy Group Note   Group Topic:Goal Setting  Group Date: 12/04/2024 Start Time: 1000 End Time: 1110 Facilitators: Celestia Jeoffrey FORBES ARTICE, CTRS Location: Craft Room  Group Description: Vision Boards. Patients were given many different magazines, a glue stick, markers, and a piece of cardstock paper. LRT and pts discussed the importance of having goals in life. LRT and pts discussed the difference between short-term and long-term goals, as well as what a SMART goal is. LRT encouraged pts to create a vision board, with images they picked and then cut out with safety scissors from the magazine, for themselves, that capture their short and long-term goals. LRT encouraged pts to show and explain their vision board to the group.   Goal Area(s) Addressed:  Patient will gain knowledge of short vs. long term goals.  Patient will identify goals for themselves. Patient will practice setting SMART goals. Patient will verbalize their goals to LRT and peers.   Affect/Mood: Appropriate   Participation Level: Active and Engaged   Participation Quality: Independent   Behavior: Calm and Cooperative   Speech/Thought Process: Coherent   Insight: Good   Judgement: Good   Modes of Intervention: Art   Patient Response to Interventions:  Attentive, Engaged, Interested , and Receptive   Education Outcome:  Acknowledges education   Clinical Observations/Individualized Feedback: Dominic [name] was active in their participation of session activities and group discussion. Pt identified I want to grow sunflowers and roses some day as goals.    Plan: Continue to engage patient in RT group sessions 2-3x/week.   Jeoffrey FORBES Celestia, LRT, CTRS 12/04/2024 11:45 AM

## 2024-12-04 NOTE — BHH Counselor (Signed)
 ACTT Referral to [facility] in [place], [place] made on patient's behalf.   Patient given instructions to follow up following discharge.  Patient's probation officer, Officer Elsie Fish visited patient to remove patient's ankle monitor. CSW to make officer Fish aware of patient's discharge to bring the monitor back to patient at his aunt's home.   Officer [employer] requested a letter on patient's behalf at discharge to be emailed to: [email or web address]  CSW to continue to assess.   Aleiya Rye, MSW, LCSWA 12/04/2024 11:06 AM

## 2024-12-04 NOTE — Progress Notes (Signed)
 [facility] MD Progress Note  12/04/2024 12:22 PM Dominic [name]  MRN:  [id number]   Subjective:  Chart reviewed, case discussed in multidisciplinary meeting, patient seen during rounds.   1/22: Patient found in milieu and dayroom on rounds. He expresses he is feeling better and knew he had to get back on his medication. He identifies that he lives with his aunt who he helps take care of and they have a wood stove. He is worried for the upcoming storm and her being unable to cut wood to keep the house warm. He denies SI, HI, and AVH. He reports wanting to engage in outpatient treatment ongoing to remain on medication. Denies other concerns.   1/21: Patient reports he is doing good. He identifies that he is discharge focused and would like to return to aunts house as he helps her with ADLs. He reports coming in to get back on medications and linked with services. He denies SI, HI, and AVH. He reports feeling better being back on medications. Denies other concerns/complaints.    Past Psychiatric History: see h&P Family History:  Family History  Problem Relation Age of Onset   Hypertension Sister    Schizophrenia Sister    Social History:  Social History   Substance and Sexual Activity  Alcohol Use Yes   Alcohol/week: 3.0 standard drinks of alcohol   Types: 3 Shots of liquor per week     Social History   Substance and Sexual Activity  Drug Use Yes   Types: Cocaine, Marijuana, Methamphetamines, Fentanyl    Comment: last night cocaine    Social History   Socioeconomic History   Marital status: Single    Spouse name: Not on file   Number of children: Not on file   Years of education: Not on file   Highest education level: Not on file  Occupational History   Not on file  Tobacco Use   Smoking status: Some Days    Current packs/day: 1.00    Average packs/day: 1 pack/day for 10.0 years (10.0 ttl pk-yrs)    Types: Cigarettes   Smokeless tobacco: Never  Vaping Use   Vaping  status: Never Used  Substance and Sexual Activity   Alcohol use: Yes    Alcohol/week: 3.0 standard drinks of alcohol    Types: 3 Shots of liquor per week   Drug use: Yes    Types: Cocaine, Marijuana, Methamphetamines, Fentanyl     Comment: last night cocaine   Sexual activity: Not Currently    Birth control/protection: Condom  Other Topics Concern   Not on file  Social History Narrative   Not on file   Social Drivers of Health   Tobacco Use: High Risk (12/02/2024)   Patient History    Smoking Tobacco Use: Some Days    Smokeless Tobacco Use: Never    Passive Exposure: Not on file  Financial Resource Strain: Low Risk (03/31/2022)   Received from [facility]   Overall Financial Resource Strain (CARDIA)    Difficulty of Paying Living Expenses: Not very hard  Food Insecurity: No Food Insecurity (12/02/2024)   Epic    Worried About Radiation Protection Practitioner of Food in the Last Year: Never true    Ran Out of Food in the Last Year: Never true  Transportation Needs: No Transportation Needs (12/02/2024)   Epic    Lack of Transportation (Medical): No    Lack of Transportation (Non-Medical): No  Physical Activity: Not on file  Stress: Not on file  Social  Connections: Socially Isolated (12/02/2024)   Social Connection and Isolation Panel    Frequency of Communication with Friends and Family: Never    Frequency of Social Gatherings with Friends and Family: Never    Attends Religious Services: Never    Database Administrator or Organizations: No    Attends Banker Meetings: Never    Marital Status: Never married  Depression (PHQ2-9): High Risk (04/19/2022)   Depression (PHQ2-9)    PHQ-2 Score: 19  Alcohol Screen: Low Risk (12/02/2024)   Alcohol Screen    Last Alcohol Screening Score (AUDIT): 0  Housing: Low Risk (12/02/2024)   Epic    Unable to Pay for Housing in the Last Year: No    Number of Times Moved in the Last Year: 0    Homeless in the Last Year: No  Utilities: Not At Risk  (12/02/2024)   Epic    Threatened with loss of utilities: No  Health Literacy: Not on file   Past Medical History:  Past Medical History:  Diagnosis Date   Alcohol intoxication, uncomplicated 01/13/2019   Anxiety    Asthma    Bipolar 1 disorder (HCC)    Chronic hepatitis C without hepatic coma (HCC) 05/28/2018   Depression    Hepatitis C    Hepatitis C antibody positive in blood 05/28/2018   Malingering 03/30/2022   Psychiatry note from [facility] states directly admitted to fabricating SI and CAH to obtain [psychiatric] admission   Schizophrenia (HCC)    Sleep apnea     Past Surgical History:  Procedure Laterality Date   CYST REMOVAL NECK     neck   INTRAMEDULLARY (IM) NAIL FIBULA Right 07/14/2022   Procedure: INTRAMEDULLARY (IM) NAIL FIBULA;  Surgeon: Rollene Cough, MD;  Location: [facility] ORS;  Service: Orthopedics;  Laterality: Right;   TIBIA IM NAIL INSERTION Right 07/14/2022   Procedure: INTRAMEDULLARY (IM) NAIL TIBIAL;  Surgeon: Rollene Cough, MD;  Location: [facility] ORS;  Service: Orthopedics;  Laterality: Right;    Current Medications: Current Facility-Administered Medications  Medication Dose Route Frequency Provider Last Rate Last Admin   acetaminophen  (TYLENOL ) tablet 650 mg  650 mg Oral Q6H PRN Trudy Carwin, NP   650 mg at 12/04/24 1110   alum & mag hydroxide-simeth (MAALOX/MYLANTA) 200-200-20 MG/5ML suspension 30 mL  30 mL Oral Q4H PRN Trudy Carwin, NP       ARIPiprazole  (ABILIFY ) tablet 2 mg  2 mg Oral Daily Elianie Hubers, NP   2 mg at 12/04/24 9196   haloperidol  (HALDOL ) tablet 5 mg  5 mg Oral TID PRN Trudy Carwin, NP   5 mg at 12/02/24 0840   And   diphenhydrAMINE  (BENADRYL ) capsule 50 mg  50 mg Oral TID PRN Trudy Carwin, NP   50 mg at 12/02/24 0840   haloperidol  lactate (HALDOL ) injection 5 mg  5 mg Intramuscular TID PRN Trudy Carwin, NP       And   diphenhydrAMINE  (BENADRYL ) injection 50 mg  50 mg Intramuscular TID PRN Trudy Carwin, NP       And   LORazepam   (ATIVAN ) injection 2 mg  2 mg Intramuscular TID PRN Trudy Carwin, NP       haloperidol  lactate (HALDOL ) injection 10 mg  10 mg Intramuscular TID PRN Trudy Carwin, NP       And   diphenhydrAMINE  (BENADRYL ) injection 50 mg  50 mg Intramuscular TID PRN Trudy Carwin, NP       And   LORazepam  (ATIVAN ) injection 2  mg  2 mg Intramuscular TID PRN Trudy Carwin, NP       magnesium  hydroxide (MILK OF MAGNESIA) suspension 30 mL  30 mL Oral Daily PRN Trudy Carwin, NP        Lab Results: No results found for this or any previous visit (from the past 48 hours).  Blood Alcohol level:  Lab Results  Component Value Date   ETH <15 12/01/2024   ETH 101 (H) 08/17/2024    Metabolic Disorder Labs: Lab Results  Component Value Date   HGBA1C 5.1 08/17/2024   MPG 99.67 08/17/2024   MPG 96.8 02/16/2022   No results found for: PROLACTIN Lab Results  Component Value Date   CHOL 159 08/18/2024   TRIG 54 08/18/2024   HDL 49 08/18/2024   CHOLHDL 3.2 08/18/2024   VLDL 11 08/18/2024   LDLCALC 99 08/18/2024   LDLCALC 99 02/16/2022    Physical Findings: AIMS:  , ,  ,  ,    CIWA:    COWS:      Psychiatric Specialty Exam:  Presentation  General Appearance:  Appropriate for Environment; Casual; Fairly Groomed  Eye Contact: Good  Speech: Clear and Coherent; Normal Rate  Speech Volume: Normal    Mood and Affect  Mood: Euthymic  Affect: Appropriate; Congruent   Thought Process  Thought Processes: Goal Directed; Coherent; Linear  Orientation:Full (Time, Place and Person)  Thought Content:Logical  Hallucinations:Hallucinations: None   Ideas of Reference:None  Suicidal Thoughts:Suicidal Thoughts: No   Homicidal Thoughts:Homicidal Thoughts: No   Sensorium  Memory: Immediate Fair; Recent Fair; Remote Fair  Judgment: Fair  Insight: Fair   Art Therapist  Concentration: Fair  Attention Span: Fair  Recall: [employer] of  Knowledge: Fair  Language: Fair   Psychomotor Activity  Psychomotor Activity: Psychomotor Activity: Normal  Musculoskeletal: Strength & Muscle Tone: within normal limits Gait & Station: normal Assets  Assets: Manufacturing Systems Engineer; Desire for Improvement; Housing; Social Support    Physical Exam: Physical Exam Vitals and nursing note reviewed.  Constitutional:      Appearance: Normal appearance.  Pulmonary:     Effort: Pulmonary effort is normal.  Neurological:     Mental Status: He is alert and oriented to person, place, and time.  Psychiatric:        Mood and Affect: Mood normal.        Behavior: Behavior normal.        Thought Content: Thought content normal.    Review of Systems  Respiratory:  Negative for shortness of breath.   Cardiovascular:  Negative for chest pain.  Gastrointestinal:  Negative for diarrhea, nausea and vomiting.  Psychiatric/Behavioral:  Positive for substance abuse. Negative for depression, hallucinations and suicidal ideas. The patient is not nervous/anxious.   All other systems reviewed and are negative.  Blood pressure 133/84, pulse 62, temperature 98.2 F (36.8 C), temperature source Oral, resp. rate 12, height 5' 11 (1.803 m), weight 99.1 kg, SpO2 100%. Body mass index is 30.47 kg/m.  Diagnosis: Principal Problem:   Bipolar disorder, mixed (HCC)   PLAN: Safety and Monitoring:  -- Involuntary admission to inpatient psychiatric unit for safety, stabilization and treatment  -- Daily contact with patient to assess and evaluate symptoms and progress in treatment  -- Patient's case to be discussed in multi-disciplinary team meeting  -- Observation Level : q15 minute checks  -- Vital signs:  q12 hours  -- Precautions: suicide, elopement, and assault -- Encouraged patient to participate in unit milieu and  in scheduled group therapies  2. Psychiatric Treatment:  Scheduled Medications: Abilify  2 mg daily     -- The  risks/benefits/side-effects/alternatives to this medication were discussed in detail with the patient and time was given for questions. The patient consents to medication trial.  3. Medical Issues Being Addressed:  No concerns expressed at this time.    4. Discharge Planning:   -- Social work and case management to assist with discharge planning and identification of hospital follow-up needs prior to discharge  -- Estimated LOS: 3-4 days  Likely discharge tomorrow.   Dontel Harshberger, NP 12/04/2024, 12:22 PM

## 2024-12-05 DIAGNOSIS — F3160 Bipolar disorder, current episode mixed, unspecified: Secondary | ICD-10-CM | POA: Diagnosis not present

## 2024-12-05 MED ORDER — ARIPIPRAZOLE 2 MG PO TABS: 2.0000 mg | ORAL_TABLET | Freq: Every day | ORAL | 0 refills | Status: AC

## 2024-12-05 NOTE — BHH Counselor (Signed)
 CSW touched base with Ronal Silvan, [phone], Aunt to engage in safe discharge planning pending patient's discharge today, 12/06/23.   Aunt reported no safety concerns and reported that she is comfortable and in agreement with discharge.   Aunt confirmed no weapons in the home.   Gaje Tennyson, MSW, LCSWA 12/05/2024 8:54 AM

## 2024-12-05 NOTE — Progress Notes (Signed)
" °   12/04/24 2010  Psych Admission Type (Psych Patients Only)  Admission Status Voluntary  Psychosocial Assessment  Patient Complaints None  Eye Contact Fair  Facial Expression Sad;Flat  Affect Sad  Speech Logical/coherent  Interaction Guarded  Motor Activity Slow  Appearance/Hygiene In scrubs  Behavior Characteristics Anxious  Mood Preoccupied  Aggressive Behavior  Effect No apparent injury  Thought Process  Coherency WDL  Content WDL  Delusions None reported or observed  Perception WDL  Hallucination None reported or observed  Judgment WDL  Confusion Mild  Danger to Self  Current suicidal ideation? Denies  Danger to Others  Danger to Others None reported or observed    "

## 2024-12-05 NOTE — Discharge Summary (Signed)
 " Physician Discharge Summary Note  Patient:  Dominic [name] is an 49 y.o., male MRN:  [id number] DOB:  1975-11-30 Patient phone:  [phone] (home)  Patient address:   [address] [place] [address],   Total time spent: 40 min Date of Admission:  12/02/2024 Date of Discharge: 12/05/2024  Reason for Admission: SI, hallucinations, and depression  Principal Problem: Bipolar disorder, mixed (HCC) Discharge Diagnoses: Principal Problem:   Bipolar disorder, mixed (HCC)   Past Psychiatric History: see h&p  Family Psychiatric  History: see h&p Social History:  Social History   Substance and Sexual Activity  Alcohol Use Yes   Alcohol/week: 3.0 standard drinks of alcohol   Types: 3 Shots of liquor per week     Social History   Substance and Sexual Activity  Drug Use Yes   Types: Cocaine, Marijuana, Methamphetamines, Fentanyl    Comment: last night cocaine    Social History   Socioeconomic History   Marital status: Single    Spouse name: Not on file   Number of children: Not on file   Years of education: Not on file   Highest education level: Not on file  Occupational History   Not on file  Tobacco Use   Smoking status: Some Days    Current packs/day: 1.00    Average packs/day: 1 pack/day for 10.0 years (10.0 ttl pk-yrs)    Types: Cigarettes   Smokeless tobacco: Never  Vaping Use   Vaping status: Never Used  Substance and Sexual Activity   Alcohol use: Yes    Alcohol/week: 3.0 standard drinks of alcohol    Types: 3 Shots of liquor per week   Drug use: Yes    Types: Cocaine, Marijuana, Methamphetamines, Fentanyl     Comment: last night cocaine   Sexual activity: Not Currently    Birth control/protection: Condom  Other Topics Concern   Not on file  Social History Narrative   Not on file   Social Drivers of Health   Tobacco Use: High Risk (12/02/2024)   Patient History    Smoking Tobacco Use: Some Days    Smokeless Tobacco Use: Never     Passive Exposure: Not on file  Financial Resource Strain: Low Risk (03/31/2022)   Received from [facility]   Overall Financial Resource Strain (CARDIA)    Difficulty of Paying Living Expenses: Not very hard  Food Insecurity: No Food Insecurity (12/02/2024)   Epic    Worried About Radiation Protection Practitioner of Food in the Last Year: Never true    Ran Out of Food in the Last Year: Never true  Transportation Needs: No Transportation Needs (12/02/2024)   Epic    Lack of Transportation (Medical): No    Lack of Transportation (Non-Medical): No  Physical Activity: Not on file  Stress: Not on file  Social Connections: Socially Isolated (12/02/2024)   Social Connection and Isolation Panel    Frequency of Communication with Friends and Family: Never    Frequency of Social Gatherings with Friends and Family: Never    Attends Religious Services: Never    Database Administrator or Organizations: No    Attends Banker Meetings: Never    Marital Status: Never married  Depression (PHQ2-9): High Risk (04/19/2022)   Depression (PHQ2-9)    PHQ-2 Score: 19  Alcohol Screen: Low Risk (12/02/2024)   Alcohol Screen    Last Alcohol Screening Score (AUDIT): 0  Housing: Low Risk (12/02/2024)   Epic    Unable to  Pay for Housing in the Last Year: No    Number of Times Moved in the Last Year: 0    Homeless in the Last Year: No  Utilities: Not At Risk (12/02/2024)   Epic    Threatened with loss of utilities: No  Health Literacy: Not on file   Past Medical History:  Past Medical History:  Diagnosis Date   Alcohol intoxication, uncomplicated 01/13/2019   Anxiety    Asthma    Bipolar 1 disorder (HCC)    Chronic hepatitis C without hepatic coma (HCC) 05/28/2018   Depression    Hepatitis C    Hepatitis C antibody positive in blood 05/28/2018   Malingering 03/30/2022   Psychiatry note from [facility] states directly admitted to fabricating SI and CAH to obtain [psychiatric] admission   Schizophrenia (HCC)     Sleep apnea     Past Surgical History:  Procedure Laterality Date   CYST REMOVAL NECK     neck   INTRAMEDULLARY (IM) NAIL FIBULA Right 07/14/2022   Procedure: INTRAMEDULLARY (IM) NAIL FIBULA;  Surgeon: Rollene Cough, MD;  Location: [facility] ORS;  Service: Orthopedics;  Laterality: Right;   TIBIA IM NAIL INSERTION Right 07/14/2022   Procedure: INTRAMEDULLARY (IM) NAIL TIBIAL;  Surgeon: Rollene Cough, MD;  Location: [facility] ORS;  Service: Orthopedics;  Laterality: Right;   Family History:  Family History  Problem Relation Age of Onset   Hypertension Sister    Schizophrenia Sister     Hospital Course:    Patient is a 49 year old male with a pphx of Substance abuse, suicidal ideation, Bipolar affective disorder, cocaine abuse, alcohol abuse who presented to the ER for evaluation of SI, hallucinations, and depression.    Patient reported being released from jail in Oct 2025 and only being given a 30 day supply of medication - patient unsure exactly of medication. He endorses being out of medication for several months. He endorses living with his aunt in [place] and previously going to [facility] for outpatient treatment. He identifies that [facility] in [place] declined him stating he needed peer support.   On admission, endorsed ongoing passive SI with a plan to hang himself.  He continued to endorse AVH with VH being spirits I see them beside you right now and CAH as voices telling me to harm myself.  Patient did not appear to be responding to internal stimuli throughout admission.  UDS was positive for cocaine and THC on admission.  Patient was on parole and had to have ankle monitor removed during admission.  Patient was started on Abilify  2 mg daily and reported improvement in his symptoms.  He was engaged in his treatment, participated in groups, and was medication compliant.  Patient was open to outpatient treatment for therapy and ongoing medication management.  Detailed risk  assessment is complete based on clinical exam and individual risk factors and acute suicide risk is low and acute violence risk is low.    On the day of discharge, patient denies SI/HI/plan and denies hallucinations.  Patient remains future oriented and is willing to participate in outpatient mental health services.  Currently, all modifiable risk of harm to self/harm to others have been addressed and patient is no longer appropriate for the acute inpatient setting and is able to continue treatment for mental health needs in the community with the supports as indicated below.  Patient is educated and verbalized understanding of discharge plan of care including medications, follow-up appointments, mental health resources and further  crisis services in the community.  He is instructed to call 911 or present to the nearest emergency room should he experience any decompensation in mood, disturbance of bowel or return of suicidal/homicidal ideations.  Patient verbalizes understanding of this education and agrees to this plan of care  Physical Findings: AIMS:  , ,  ,  ,    CIWA:    COWS:      Psychiatric Specialty Exam:  Presentation  General Appearance:  Appropriate for Environment; Casual; Fairly Groomed  Eye Contact: Good  Speech: Clear and Coherent; Normal Rate  Speech Volume: Normal    Mood and Affect  Mood: Euthymic  Affect: Appropriate; Congruent   Thought Process  Thought Processes: Goal Directed; Coherent; Linear  Descriptions of Associations:Intact  Orientation:Full (Time, Place and Person)  Thought Content:Logical  Hallucinations:Hallucinations: None  Ideas of Reference:None  Suicidal Thoughts:Suicidal Thoughts: No  Homicidal Thoughts:Homicidal Thoughts: No   Sensorium  Memory: Immediate Fair; Recent Fair; Remote Fair  Judgment: Fair  Insight: Fair   Art Therapist  Concentration: Fair  Attention Span: Fair  Recall: [employer] of  Knowledge: Fair  Language: Fair   Psychomotor Activity  Psychomotor Activity: Psychomotor Activity: Normal  Musculoskeletal: Strength & Muscle Tone: within normal limits Gait & Station: normal Assets  Assets: Manufacturing Systems Engineer; Desire for Improvement; Housing; Social Support   Sleep  Sleep: Sleep: Good    Physical Exam: Physical Exam Vitals and nursing note reviewed.  Constitutional:      Appearance: Normal appearance.  Pulmonary:     Effort: Pulmonary effort is normal.  Neurological:     Mental Status: He is alert and oriented to person, place, and time.  Psychiatric:        Mood and Affect: Mood normal.        Behavior: Behavior normal.        Thought Content: Thought content normal.    Review of Systems  Respiratory:  Negative for shortness of breath.   Cardiovascular:  Negative for chest pain.  Gastrointestinal:  Negative for diarrhea, nausea and vomiting.  Psychiatric/Behavioral:  Positive for substance abuse. Negative for depression, hallucinations and suicidal ideas. The patient is not nervous/anxious.   All other systems reviewed and are negative.  Blood pressure 133/74, pulse (!) 55, temperature 98.6 F (37 C), temperature source Oral, resp. rate 20, height 5' 11 (1.803 m), weight 99.1 kg, SpO2 100%. Body mass index is 30.47 kg/m.   Tobacco Use History[1] Tobacco Cessation:  A prescription for an FDA-approved tobacco cessation medication was offered at discharge and the patient refused   Blood Alcohol level:  Lab Results  Component Value Date   ETH <15 12/01/2024   ETH 101 (H) 08/17/2024    Metabolic Disorder Labs:  Lab Results  Component Value Date   HGBA1C 5.1 08/17/2024   MPG 99.67 08/17/2024   MPG 96.8 02/16/2022   No results found for: PROLACTIN Lab Results  Component Value Date   CHOL 159 08/18/2024   TRIG 54 08/18/2024   HDL 49 08/18/2024   CHOLHDL 3.2 08/18/2024   VLDL 11 08/18/2024   LDLCALC 99 08/18/2024   LDLCALC  99 02/16/2022    See Psychiatric Specialty Exam and Suicide Risk Assessment completed by Attending Physician prior to discharge.  Discharge destination:  Home  Is patient on multiple antipsychotic therapies at discharge:  No   Has Patient had three or more failed trials of antipsychotic monotherapy by history:  No  Recommended Plan for Multiple Antipsychotic Therapies: NA  Discharge Instructions     Increase activity slowly   Complete by: As directed       Allergies as of 12/05/2024   No Known Allergies      Medication List     TAKE these medications      Indication  ARIPiprazole  2 MG tablet Commonly known as: ABILIFY  Take 1 tablet (2 mg total) by mouth daily.  Indication: Major Depressive Disorder   aspirin  EC 81 MG tablet Take 1 tablet (81 mg total) by mouth daily. Swallow whole.  Indication: Prevention of Unwanted Clot in Veins   atorvastatin  40 MG tablet Commonly known as: LIPITOR Take 1 tablet (40 mg total) by mouth daily.  Indication: High Amount of Fats in the Blood        Follow-up Information     [facility] Follow up.   Why: In person assessment for therapy and psychiatry is 12/11/24 at 9:00 AM. Contact information: [address] [place] [address] [phone]         [facility] Follow up.   Why: A referral for ACTT services has been made on your behalf. Please follow up to ensure the next steps are completed. Contact information: [address] [place] [address] [phone]                 Follow-up recommendations: Follow-up with outpatient providers listed above    Signed: Lacey Dotson, NP 12/05/2024, 1:34 AM            [1]  Social History Tobacco Use  Smoking Status Some Days   Current packs/day: 1.00   Average packs/day: 1 pack/day for 10.0 years (10.0 ttl pk-yrs)   Types: Cigarettes  Smokeless Tobacco Never   "

## 2024-12-05 NOTE — BHH Counselor (Signed)
 Patient letter of discharge has been emailed to [employer] as requested.   Patient's probation officer has been made aware of discharge.   Jennifr Gaeta, MSW, LCSWA 12/05/2024 9:31 AM

## 2024-12-05 NOTE — [facility] Suicide Risk Assessment (Cosign Needed)
 [facility] Discharge Suicide Risk Assessment   Principal Problem: Bipolar disorder, mixed (HCC) Discharge Diagnoses: Principal Problem:   Bipolar disorder, mixed (HCC)   Total Time spent with patient: 1 hour  Musculoskeletal: Strength & Muscle Tone: within normal limits Gait & Station: normal Patient leans: N/A  Psychiatric Specialty Exam  Presentation  General Appearance:  Appropriate for Environment; Casual; Fairly Groomed  Eye Contact: Good  Speech: Clear and Coherent; Normal Rate  Speech Volume: Normal  Handedness:No data recorded  Mood and Affect  Mood: Euthymic  Duration of Depression Symptoms: No data recorded Affect: Appropriate; Congruent   Thought Process  Thought Processes: Goal Directed; Coherent; Linear  Descriptions of Associations:Intact  Orientation:Full (Time, Place and Person)  Thought Content:Logical  History of Schizophrenia/Schizoaffective disorder:No data recorded Duration of Psychotic Symptoms:No data recorded Hallucinations:Hallucinations: None  Ideas of Reference:None  Suicidal Thoughts:Suicidal Thoughts: No  Homicidal Thoughts:Homicidal Thoughts: No   Sensorium  Memory: Immediate Fair; Recent Fair; Remote Fair  Judgment: Fair  Insight: Fair   Art Therapist  Concentration: Fair  Attention Span: Fair  Recall: [employer] of Knowledge: Fair  Language: Fair   Psychomotor Activity  Psychomotor Activity: Psychomotor Activity: Normal   Assets  Assets: Communication Skills; Desire for Improvement; Housing; Social Support   Sleep  Sleep: Sleep: Good  Estimated Sleeping Duration (Last 24 Hours): 6.75-7.25 hours  Physical Exam: Physical Exam Vitals and nursing note reviewed.  Constitutional:      Appearance: Normal appearance.  Pulmonary:     Effort: Pulmonary effort is normal.  Neurological:     Mental Status: He is alert and oriented to person, place, and time.  Psychiatric:        Mood  and Affect: Mood normal.        Behavior: Behavior normal.        Thought Content: Thought content normal.    Review of Systems  Respiratory:  Negative for shortness of breath.   Cardiovascular:  Negative for chest pain.  Gastrointestinal:  Negative for diarrhea, nausea and vomiting.  Psychiatric/Behavioral:  Positive for substance abuse. Negative for depression, hallucinations and suicidal ideas. The patient is not nervous/anxious.   All other systems reviewed and are negative.  Blood pressure 133/74, pulse (!) 55, temperature 98.6 F (37 C), temperature source Oral, resp. rate 20, height 5' 11 (1.803 m), weight 99.1 kg, SpO2 100%. Body mass index is 30.47 kg/m.  Mental Status Per Nursing Assessment::   On Admission:  NA  Demographic Factors:  Male, Low socioeconomic status, and Unemployed  Loss Factors: Legal issues  Historical Factors: Impulsivity  Risk Reduction Factors:   Sense of responsibility to family, Living with another person, especially a relative, and Positive social support  Continued Clinical Symptoms:  Alcohol/Substance Abuse/Dependencies Previous Psychiatric Diagnoses and Treatments  Cognitive Features That Contribute To Risk:  None    Suicide Risk:  Minimal: No identifiable suicidal ideation.  Patients presenting with no risk factors but with morbid ruminations; Dominic [name] be classified as minimal risk based on the severity of the depressive symptoms   Follow-up Information     [facility] Follow up.   Why: In person assessment for therapy and psychiatry is 12/11/24 at 9:00 AM. Contact information: [address] [place] [address] [phone]         [facility] Follow up.   Why: A referral for ACTT services has been made on your behalf. Please follow up to ensure the next steps are completed.  Contact information: [address] [place] [address] [phone]                 Plan Of Care/Follow-up  recommendations:  Follow up with outpatient providers listed above.   Finneus Kaneshiro, NP 12/05/2024, 1:30 AM

## 2024-12-05 NOTE — Discharge Instructions (Signed)

## 2024-12-05 NOTE — Plan of Care (Signed)

## 2024-12-05 NOTE — Progress Notes (Signed)
 Patient ID: Dominic [name], male   DOB: 1976/05/30, 49 y.o.   MRN: [id number]  I assumed care for above patient at about 08:00.  He was resting in his room, awake, in no apparent distress. Vital signs from this am wnl, he reports ongoing auditory hallucinations, non command in nature, he verbalized that the same is well managed by scheduled meds. He denied any HI/SI/VH at the time of my assessment.  Pt was discharged as ordered from the unit at about 09:30, fully ambulatory, verbalized understanding of discharge instructions and did not have any question for me at the time of discharge. Pt was discharged via [facility].

## 2024-12-05 NOTE — Plan of Care (Signed)
   Problem: Education: Goal: Knowledge of [facility] Education information/materials will improve Outcome: Adequate for Discharge Goal: Emotional status will improve Outcome: Adequate for Discharge Goal: Mental status will improve Outcome: Adequate for Discharge Goal: Verbalization of understanding the information provided will improve Outcome: Adequate for Discharge   Problem: Activity: Goal: Interest or engagement in activities will improve Outcome: Adequate for Discharge Goal: Sleeping patterns will improve Outcome: Adequate for Discharge   Problem: Coping: Goal: Ability to verbalize frustrations and anger appropriately will improve Outcome: Adequate for Discharge Goal: Ability to demonstrate self-control will improve Outcome: Adequate for Discharge   Problem: Health Behavior/Discharge Planning: Goal: Identification of resources available to assist in meeting health care needs will improve Outcome: Adequate for Discharge Goal: Compliance with treatment plan for underlying cause of condition will improve Outcome: Adequate for Discharge   Problem: Physical Regulation: Goal: Ability to maintain clinical measurements within normal limits will improve Outcome: Adequate for Discharge   Problem: Safety: Goal: Periods of time without injury will increase Outcome: Adequate for Discharge   Problem: Education: Goal: Knowledge of General Education information will improve Description: Including pain rating scale, medication(s)/side effects and non-pharmacologic comfort measures Outcome: Adequate for Discharge   Problem: Health Behavior/Discharge Planning: Goal: Ability to manage health-related needs will improve Outcome: Adequate for Discharge   Problem: Clinical Measurements: Goal: Ability to maintain clinical measurements within normal limits will improve Outcome: Adequate for Discharge Goal: Will remain free from infection Outcome: Adequate for Discharge Goal:  Diagnostic test results will improve Outcome: Adequate for Discharge Goal: Respiratory complications will improve Outcome: Adequate for Discharge Goal: Cardiovascular complication will be avoided Outcome: Adequate for Discharge   Problem: Activity: Goal: Risk for activity intolerance will decrease Outcome: Adequate for Discharge   Problem: Nutrition: Goal: Adequate nutrition will be maintained Outcome: Adequate for Discharge   Problem: Coping: Goal: Level of anxiety will decrease Outcome: Adequate for Discharge   Problem: Elimination: Goal: Will not experience complications related to bowel motility Outcome: Adequate for Discharge Goal: Will not experience complications related to urinary retention Outcome: Adequate for Discharge   Problem: Pain Managment: Goal: General experience of comfort will improve and/or be controlled Outcome: Adequate for Discharge   Problem: Safety: Goal: Ability to remain free from injury will improve Outcome: Adequate for Discharge   Problem: Skin Integrity: Goal: Risk for impaired skin integrity will decrease Outcome: Adequate for Discharge

## 2024-12-05 NOTE — Progress Notes (Signed)
" °  [facility] Adult Case Management Discharge Plan :  Will you be returning to the same living situation after discharge:  Yes,  Patient to return to his aunt's home.  At discharge, do you have transportation home?: Yes,  CSW has arranged taxi services on patient's behalf.  Do you have the ability to pay for your medications: Yes,  [employer] / [employer]  Release of information consent forms completed and in the chart;  Patient's signature needed at discharge.  Patient to Follow up at:  Follow-up Information     [facility] Follow up.   Why: In person assessment for therapy and psychiatry is 12/11/24 at 9:00 AM. Contact information: [address] [place] [address] [phone]         [facility] Follow up.   Why: A referral for ACTT services has been made on your behalf. Please follow up to ensure the next steps are completed. Contact information: [address] [place] [address] [phone]                 Next level of care provider has access to [facility] Link:no  Safety Planning and Suicide Prevention discussed: Yes,  Elsie Fish, [phone], Probation officer; Ronal Silvan, [phone], Aunt     Has patient been referred to the Quitline?: Patient refused referral for treatment  Patient has been referred for addiction treatment: Yes, the patient will follow up with an outpatient provider for substance use disorder. Psychiatrist/APP: appointment made and Therapist: appointment made  Alveta CHRISTELLA Kerns, LCSW 12/05/2024, 8:51 AM "
